# Patient Record
Sex: Male | Born: 1940 | Race: White | Hispanic: No | Marital: Married | Smoking: Former smoker
Health system: Southern US, Community
[De-identification: ages and names within clinical notes are randomized; demographics above are authoritative.]

## PROBLEM LIST (undated history)

## (undated) DIAGNOSIS — Z87828 Personal history of other (healed) physical injury and trauma: Secondary | ICD-10-CM

## (undated) DIAGNOSIS — M4800 Spinal stenosis, site unspecified: Secondary | ICD-10-CM

## (undated) DIAGNOSIS — E1140 Type 2 diabetes mellitus with diabetic neuropathy, unspecified: Secondary | ICD-10-CM

## (undated) DIAGNOSIS — E559 Vitamin D deficiency, unspecified: Secondary | ICD-10-CM

## (undated) DIAGNOSIS — I350 Nonrheumatic aortic (valve) stenosis: Secondary | ICD-10-CM

## (undated) DIAGNOSIS — I6523 Occlusion and stenosis of bilateral carotid arteries: Secondary | ICD-10-CM

## (undated) DIAGNOSIS — E1129 Type 2 diabetes mellitus with other diabetic kidney complication: Secondary | ICD-10-CM

## (undated) DIAGNOSIS — Z85828 Personal history of other malignant neoplasm of skin: Secondary | ICD-10-CM

## (undated) DIAGNOSIS — I255 Ischemic cardiomyopathy: Secondary | ICD-10-CM

## (undated) DIAGNOSIS — E669 Obesity, unspecified: Secondary | ICD-10-CM

## (undated) DIAGNOSIS — N400 Enlarged prostate without lower urinary tract symptoms: Secondary | ICD-10-CM

## (undated) DIAGNOSIS — E7800 Pure hypercholesterolemia, unspecified: Secondary | ICD-10-CM

## (undated) DIAGNOSIS — C801 Malignant (primary) neoplasm, unspecified: Secondary | ICD-10-CM

## (undated) DIAGNOSIS — Z951 Presence of aortocoronary bypass graft: Secondary | ICD-10-CM

## (undated) DIAGNOSIS — J449 Chronic obstructive pulmonary disease, unspecified: Secondary | ICD-10-CM

## (undated) DIAGNOSIS — I998 Other disorder of circulatory system: Secondary | ICD-10-CM

## (undated) DIAGNOSIS — J309 Allergic rhinitis, unspecified: Secondary | ICD-10-CM

## (undated) DIAGNOSIS — S88119A Complete traumatic amputation at level between knee and ankle, unspecified lower leg, initial encounter: Secondary | ICD-10-CM

## (undated) DIAGNOSIS — I10 Essential (primary) hypertension: Secondary | ICD-10-CM

## (undated) DIAGNOSIS — Z8673 Personal history of transient ischemic attack (TIA), and cerebral infarction without residual deficits: Secondary | ICD-10-CM

## (undated) HISTORY — DX: Obesity, unspecified: E66.9

## (undated) HISTORY — DX: Type 2 diabetes mellitus with diabetic neuropathy, unspecified: E11.40

## (undated) HISTORY — PX: TONSILLECTOMY: SUR1361

## (undated) HISTORY — DX: Benign prostatic hyperplasia without lower urinary tract symptoms: N40.0

## (undated) HISTORY — DX: Complete traumatic amputation at level between knee and ankle, unspecified lower leg, initial encounter: S88.119A

## (undated) HISTORY — PX: OTHER SURGICAL HISTORY: SHX169

## (undated) HISTORY — DX: Occlusion and stenosis of bilateral carotid arteries: I65.23

## (undated) HISTORY — DX: Vitamin D deficiency, unspecified: E55.9

## (undated) HISTORY — DX: Type 2 diabetes mellitus with other diabetic kidney complication: E11.29

## (undated) HISTORY — PX: CHOLECYSTECTOMY: SHX55

## (undated) HISTORY — DX: Spinal stenosis, site unspecified: M48.00

## (undated) HISTORY — PX: LUMBAR LAMINECTOMY/DECOMPRESSION MICRODISCECTOMY: SHX5026

## (undated) HISTORY — PX: BACK SURGERY: SHX140

## (undated) HISTORY — PX: CORONARY ARTERY BYPASS GRAFT: SHX141

## (undated) HISTORY — DX: Chronic obstructive pulmonary disease, unspecified: J44.9

---

## 2003-11-23 ENCOUNTER — Emergency Department (HOSPITAL_COMMUNITY): Admission: EM | Admit: 2003-11-23 | Discharge: 2003-11-23 | Payer: Self-pay | Admitting: Emergency Medicine

## 2004-12-25 ENCOUNTER — Ambulatory Visit (HOSPITAL_COMMUNITY): Admission: AD | Admit: 2004-12-25 | Discharge: 2004-12-28 | Payer: Self-pay | Admitting: Neurosurgery

## 2005-04-16 ENCOUNTER — Encounter: Admission: RE | Admit: 2005-04-16 | Discharge: 2005-04-16 | Payer: Self-pay | Admitting: Neurosurgery

## 2005-06-11 ENCOUNTER — Encounter: Admission: RE | Admit: 2005-06-11 | Discharge: 2005-06-11 | Payer: Self-pay | Admitting: Specialist

## 2005-07-08 ENCOUNTER — Encounter: Admission: RE | Admit: 2005-07-08 | Discharge: 2005-07-08 | Payer: Self-pay | Admitting: Specialist

## 2005-08-05 ENCOUNTER — Ambulatory Visit: Payer: Self-pay

## 2005-09-13 ENCOUNTER — Inpatient Hospital Stay (HOSPITAL_COMMUNITY): Admission: AD | Admit: 2005-09-13 | Discharge: 2005-09-16 | Payer: Self-pay | Admitting: Specialist

## 2005-09-17 ENCOUNTER — Emergency Department (HOSPITAL_COMMUNITY): Admission: EM | Admit: 2005-09-17 | Discharge: 2005-09-17 | Payer: Self-pay | Admitting: *Deleted

## 2006-03-05 ENCOUNTER — Emergency Department (HOSPITAL_COMMUNITY): Admission: EM | Admit: 2006-03-05 | Discharge: 2006-03-05 | Payer: Self-pay | Admitting: Emergency Medicine

## 2008-12-20 DIAGNOSIS — Z951 Presence of aortocoronary bypass graft: Secondary | ICD-10-CM

## 2008-12-20 HISTORY — DX: Presence of aortocoronary bypass graft: Z95.1

## 2008-12-26 ENCOUNTER — Encounter: Payer: Self-pay | Admitting: Cardiology

## 2009-01-01 ENCOUNTER — Ambulatory Visit: Payer: Self-pay | Admitting: Cardiology

## 2009-01-15 ENCOUNTER — Ambulatory Visit: Payer: Self-pay

## 2009-01-15 ENCOUNTER — Encounter: Payer: Self-pay | Admitting: Cardiology

## 2009-01-21 ENCOUNTER — Ambulatory Visit: Payer: Self-pay | Admitting: Vascular Surgery

## 2009-01-28 ENCOUNTER — Ambulatory Visit: Payer: Self-pay

## 2009-01-30 ENCOUNTER — Ambulatory Visit: Payer: Self-pay | Admitting: Cardiology

## 2009-01-30 LAB — CONVERTED CEMR LAB
BUN: 16 mg/dL (ref 6–23)
Basophils Absolute: 0 10*3/uL (ref 0.0–0.1)
Basophils Relative: 0.3 % (ref 0.0–3.0)
CO2: 31 meq/L (ref 19–32)
Calcium: 9.4 mg/dL (ref 8.4–10.5)
Chloride: 98 meq/L (ref 96–112)
Creatinine, Ser: 0.8 mg/dL (ref 0.4–1.5)
Eosinophils Absolute: 0.4 10*3/uL (ref 0.0–0.7)
Eosinophils Relative: 4.3 % (ref 0.0–5.0)
GFR calc Af Amer: 124 mL/min
GFR calc non Af Amer: 103 mL/min
Glucose, Bld: 210 mg/dL — ABNORMAL HIGH (ref 70–99)
HCT: 43.9 % (ref 39.0–52.0)
Hemoglobin: 15.7 g/dL (ref 13.0–17.0)
INR: 1.1 — ABNORMAL HIGH (ref 0.8–1.0)
Lymphocytes Relative: 29.5 % (ref 12.0–46.0)
MCHC: 35.7 g/dL (ref 30.0–36.0)
MCV: 86 fL (ref 78.0–100.0)
Monocytes Absolute: 0.8 10*3/uL (ref 0.1–1.0)
Monocytes Relative: 8.1 % (ref 3.0–12.0)
Neutro Abs: 5.4 10*3/uL (ref 1.4–7.7)
Neutrophils Relative %: 57.8 % (ref 43.0–77.0)
Platelets: 213 10*3/uL (ref 150–400)
Potassium: 4 meq/L (ref 3.5–5.1)
Prothrombin Time: 11.7 s (ref 10.9–13.3)
RBC: 5.11 M/uL (ref 4.22–5.81)
RDW: 13.3 % (ref 11.5–14.6)
Sodium: 136 meq/L (ref 135–145)
WBC: 9.4 10*3/uL (ref 4.5–10.5)
aPTT: 30.4 s — ABNORMAL HIGH (ref 21.7–29.8)

## 2009-01-31 ENCOUNTER — Inpatient Hospital Stay (HOSPITAL_BASED_OUTPATIENT_CLINIC_OR_DEPARTMENT_OTHER): Admission: RE | Admit: 2009-01-31 | Discharge: 2009-01-31 | Payer: Self-pay | Admitting: Cardiology

## 2009-01-31 ENCOUNTER — Ambulatory Visit: Payer: Self-pay | Admitting: Cardiology

## 2009-02-05 ENCOUNTER — Ambulatory Visit: Payer: Self-pay | Admitting: Cardiothoracic Surgery

## 2009-02-07 ENCOUNTER — Ambulatory Visit (HOSPITAL_COMMUNITY): Admission: RE | Admit: 2009-02-07 | Discharge: 2009-02-07 | Payer: Self-pay | Admitting: Cardiothoracic Surgery

## 2009-02-07 ENCOUNTER — Encounter: Payer: Self-pay | Admitting: Cardiothoracic Surgery

## 2009-02-11 ENCOUNTER — Inpatient Hospital Stay (HOSPITAL_COMMUNITY): Admission: RE | Admit: 2009-02-11 | Discharge: 2009-02-16 | Payer: Self-pay | Admitting: Cardiothoracic Surgery

## 2009-02-11 ENCOUNTER — Ambulatory Visit: Payer: Self-pay | Admitting: Cardiothoracic Surgery

## 2009-02-11 ENCOUNTER — Ambulatory Visit: Payer: Self-pay | Admitting: Vascular Surgery

## 2009-02-28 ENCOUNTER — Ambulatory Visit: Payer: Self-pay | Admitting: Vascular Surgery

## 2009-03-07 ENCOUNTER — Ambulatory Visit: Payer: Self-pay | Admitting: Cardiothoracic Surgery

## 2009-03-07 ENCOUNTER — Encounter: Admission: RE | Admit: 2009-03-07 | Discharge: 2009-03-07 | Payer: Self-pay | Admitting: Cardiothoracic Surgery

## 2009-04-16 DIAGNOSIS — M1990 Unspecified osteoarthritis, unspecified site: Secondary | ICD-10-CM

## 2009-04-16 DIAGNOSIS — N400 Enlarged prostate without lower urinary tract symptoms: Secondary | ICD-10-CM | POA: Insufficient documentation

## 2009-04-16 DIAGNOSIS — Z951 Presence of aortocoronary bypass graft: Secondary | ICD-10-CM | POA: Insufficient documentation

## 2009-04-16 DIAGNOSIS — M48061 Spinal stenosis, lumbar region without neurogenic claudication: Secondary | ICD-10-CM | POA: Insufficient documentation

## 2009-04-16 DIAGNOSIS — I6523 Occlusion and stenosis of bilateral carotid arteries: Secondary | ICD-10-CM | POA: Insufficient documentation

## 2009-04-16 DIAGNOSIS — E782 Mixed hyperlipidemia: Secondary | ICD-10-CM | POA: Insufficient documentation

## 2009-04-16 DIAGNOSIS — I119 Hypertensive heart disease without heart failure: Secondary | ICD-10-CM

## 2009-04-16 DIAGNOSIS — E1129 Type 2 diabetes mellitus with other diabetic kidney complication: Secondary | ICD-10-CM | POA: Insufficient documentation

## 2009-04-18 ENCOUNTER — Ambulatory Visit: Payer: Self-pay

## 2009-04-18 ENCOUNTER — Encounter: Payer: Self-pay | Admitting: Nurse Practitioner

## 2009-04-18 ENCOUNTER — Encounter: Payer: Self-pay | Admitting: Cardiology

## 2009-04-18 ENCOUNTER — Ambulatory Visit: Payer: Self-pay | Admitting: Cardiology

## 2009-04-18 ENCOUNTER — Encounter (INDEPENDENT_AMBULATORY_CARE_PROVIDER_SITE_OTHER): Payer: Self-pay | Admitting: *Deleted

## 2009-04-30 ENCOUNTER — Encounter: Payer: Self-pay | Admitting: Cardiology

## 2009-04-30 ENCOUNTER — Ambulatory Visit: Payer: Self-pay

## 2009-05-02 ENCOUNTER — Telehealth: Payer: Self-pay | Admitting: Cardiology

## 2009-05-06 ENCOUNTER — Ambulatory Visit: Payer: Self-pay | Admitting: Cardiology

## 2009-09-19 ENCOUNTER — Ambulatory Visit: Payer: Self-pay | Admitting: Vascular Surgery

## 2009-10-27 ENCOUNTER — Ambulatory Visit: Payer: Self-pay | Admitting: Gastroenterology

## 2010-05-01 ENCOUNTER — Ambulatory Visit: Payer: Self-pay | Admitting: Vascular Surgery

## 2010-10-27 ENCOUNTER — Encounter: Payer: Self-pay | Admitting: Cardiology

## 2010-10-29 ENCOUNTER — Ambulatory Visit: Payer: Self-pay | Admitting: Cardiology

## 2010-10-29 DIAGNOSIS — I509 Heart failure, unspecified: Secondary | ICD-10-CM | POA: Insufficient documentation

## 2010-11-20 ENCOUNTER — Ambulatory Visit: Payer: Self-pay

## 2010-11-20 ENCOUNTER — Ambulatory Visit: Payer: Self-pay | Admitting: Cardiology

## 2010-11-20 ENCOUNTER — Encounter: Payer: Self-pay | Admitting: Cardiology

## 2010-11-20 ENCOUNTER — Ambulatory Visit (HOSPITAL_COMMUNITY)
Admission: RE | Admit: 2010-11-20 | Discharge: 2010-11-20 | Payer: Self-pay | Source: Home / Self Care | Admitting: Cardiology

## 2011-01-10 ENCOUNTER — Encounter: Payer: Self-pay | Admitting: Neurosurgery
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
## 2011-01-21 NOTE — Miscellaneous (Signed)
  Clinical Lists Changes  Observations: Added new observation of ECHOINTERP: 1. Left ventricle: I can not fully assess all LV segments. Overall    LV function seems to be good. The cavity size was normal. The    estimated ejection fraction was 60%. 2. Aortic valve: The aortic valve is calcified with decreased    motion. I suspect that Aortic stenosis is moderate. Valve area:    1.08cm^2(VTI). Valve area: 1.11cm^2 (Vmax). 3. Right ventricle: The cavity size was mildly dilated. Systolic    function was mildly reduced. (04/30/2009 13:43)      Echocardiogram  Procedure date:  04/30/2009  Findings:      1. Left ventricle: I can not fully assess all LV segments. Overall    LV function seems to be good. The cavity size was normal. The    estimated ejection fraction was 60%. 2. Aortic valve: The aortic valve is calcified with decreased    motion. I suspect that Aortic stenosis is moderate. Valve area:    1.08cm^2(VTI). Valve area: 1.11cm^2 (Vmax). 3. Right ventricle: The cavity size was mildly dilated. Systolic    function was mildly reduced.

## 2011-01-29 ENCOUNTER — Encounter: Payer: Self-pay | Admitting: Cardiology

## 2011-04-06 LAB — TYPE AND SCREEN
ABO/RH(D): O POS
Antibody Screen: NEGATIVE

## 2011-04-06 LAB — CREATININE, SERUM
Creatinine, Ser: 0.77 mg/dL (ref 0.4–1.5)
Creatinine, Ser: 1.21 mg/dL (ref 0.4–1.5)
GFR calc Af Amer: >60 mL/min (ref >60)
GFR calc Af Amer: >60 mL/min (ref >60)
GFR calc non Af Amer: >60 mL/min (ref >60)
GFR calc non Af Amer: >60 mL/min (ref >60)

## 2011-04-06 LAB — GLUCOSE, CAPILLARY
Glucose-Capillary: 101 mg/dL — ABNORMAL HIGH (ref 70–99)
Glucose-Capillary: 102 mg/dL — ABNORMAL HIGH (ref 70–99)
Glucose-Capillary: 104 mg/dL — ABNORMAL HIGH (ref 70–99)
Glucose-Capillary: 107 mg/dL — ABNORMAL HIGH (ref 70–99)
Glucose-Capillary: 110 mg/dL — ABNORMAL HIGH (ref 70–99)
Glucose-Capillary: 111 mg/dL — ABNORMAL HIGH (ref 70–99)
Glucose-Capillary: 117 mg/dL — ABNORMAL HIGH (ref 70–99)
Glucose-Capillary: 119 mg/dL — ABNORMAL HIGH (ref 70–99)
Glucose-Capillary: 121 mg/dL — ABNORMAL HIGH (ref 70–99)
Glucose-Capillary: 134 mg/dL — ABNORMAL HIGH (ref 70–99)
Glucose-Capillary: 135 mg/dL — ABNORMAL HIGH (ref 70–99)
Glucose-Capillary: 141 mg/dL — ABNORMAL HIGH (ref 70–99)
Glucose-Capillary: 149 mg/dL — ABNORMAL HIGH (ref 70–99)
Glucose-Capillary: 160 mg/dL — ABNORMAL HIGH (ref 70–99)
Glucose-Capillary: 164 mg/dL — ABNORMAL HIGH (ref 70–99)
Glucose-Capillary: 184 mg/dL — ABNORMAL HIGH (ref 70–99)
Glucose-Capillary: 184 mg/dL — ABNORMAL HIGH (ref 70–99)
Glucose-Capillary: 188 mg/dL — ABNORMAL HIGH (ref 70–99)
Glucose-Capillary: 188 mg/dL — ABNORMAL HIGH (ref 70–99)
Glucose-Capillary: 190 mg/dL — ABNORMAL HIGH (ref 70–99)
Glucose-Capillary: 236 mg/dL — ABNORMAL HIGH (ref 70–99)
Glucose-Capillary: 242 mg/dL — ABNORMAL HIGH (ref 70–99)
Glucose-Capillary: 58 mg/dL — ABNORMAL LOW (ref 70–99)
Glucose-Capillary: 81 mg/dL (ref 70–99)
Glucose-Capillary: 83 mg/dL (ref 70–99)
Glucose-Capillary: 85 mg/dL (ref 70–99)
Glucose-Capillary: 86 mg/dL (ref 70–99)
Glucose-Capillary: 91 mg/dL (ref 70–99)
Glucose-Capillary: 93 mg/dL (ref 70–99)
Glucose-Capillary: 94 mg/dL (ref 70–99)
Glucose-Capillary: 99 mg/dL (ref 70–99)

## 2011-04-06 LAB — BLOOD GAS, ARTERIAL
Acid-Base Excess: 2.1 mmol/L — ABNORMAL HIGH (ref 0.0–2.0)
Bicarbonate: 26 mEq/L — ABNORMAL HIGH (ref 20.0–24.0)
Drawn by: 313941
FIO2: 0.21 %
O2 Saturation: 96.4 %
Patient temperature: 98.6
TCO2: 27.2 mmol/L (ref 0–100)
pCO2 arterial: 39.6 mmHg (ref 35.0–45.0)
pH, Arterial: 7.433 (ref 7.350–7.450)
pO2, Arterial: 82.6 mmHg (ref 80.0–100.0)

## 2011-04-06 LAB — CBC
HCT: 23 % — ABNORMAL LOW (ref 39.0–52.0)
HCT: 23.8 % — ABNORMAL LOW (ref 39.0–52.0)
HCT: 24.3 % — ABNORMAL LOW (ref 39.0–52.0)
HCT: 26 % — ABNORMAL LOW (ref 39.0–52.0)
HCT: 28 % — ABNORMAL LOW (ref 39.0–52.0)
HCT: 28.4 % — ABNORMAL LOW (ref 39.0–52.0)
HCT: 28.9 % — ABNORMAL LOW (ref 39.0–52.0)
HCT: 45.7 % (ref 39.0–52.0)
Hemoglobin: 10 g/dL — ABNORMAL LOW (ref 13.0–17.0)
Hemoglobin: 10.1 g/dL — ABNORMAL LOW (ref 13.0–17.0)
Hemoglobin: 16 g/dL (ref 13.0–17.0)
Hemoglobin: 7.9 g/dL — CL (ref 13.0–17.0)
Hemoglobin: 8.4 g/dL — ABNORMAL LOW (ref 13.0–17.0)
Hemoglobin: 8.5 g/dL — ABNORMAL LOW (ref 13.0–17.0)
Hemoglobin: 9 g/dL — ABNORMAL LOW (ref 13.0–17.0)
Hemoglobin: 9.8 g/dL — ABNORMAL LOW (ref 13.0–17.0)
MCHC: 34.6 g/dL (ref 30.0–36.0)
MCHC: 34.8 g/dL (ref 30.0–36.0)
MCHC: 34.8 g/dL (ref 30.0–36.0)
MCHC: 35 g/dL (ref 30.0–36.0)
MCHC: 35 g/dL (ref 30.0–36.0)
MCHC: 35 g/dL (ref 30.0–36.0)
MCHC: 35.3 g/dL (ref 30.0–36.0)
MCHC: 35.4 g/dL (ref 30.0–36.0)
MCV: 85.2 fL (ref 78.0–100.0)
MCV: 85.4 fL (ref 78.0–100.0)
MCV: 86.1 fL (ref 78.0–100.0)
MCV: 86.8 fL (ref 78.0–100.0)
MCV: 86.9 fL (ref 78.0–100.0)
MCV: 87.2 fL (ref 78.0–100.0)
MCV: 87.3 fL (ref 78.0–100.0)
MCV: 88 fL (ref 78.0–100.0)
Platelets: 104 10*3/uL — ABNORMAL LOW (ref 150–400)
Platelets: 108 10*3/uL — ABNORMAL LOW (ref 150–400)
Platelets: 112 10*3/uL — ABNORMAL LOW (ref 150–400)
Platelets: 117 10*3/uL — ABNORMAL LOW (ref 150–400)
Platelets: 168 10*3/uL (ref 150–400)
Platelets: 225 10*3/uL (ref 150–400)
Platelets: 73 10*3/uL — ABNORMAL LOW (ref 150–400)
Platelets: 86 10*3/uL — ABNORMAL LOW (ref 150–400)
RBC: 2.63 MIL/uL — ABNORMAL LOW (ref 4.22–5.81)
RBC: 2.78 MIL/uL — ABNORMAL LOW (ref 4.22–5.81)
RBC: 2.79 MIL/uL — ABNORMAL LOW (ref 4.22–5.81)
RBC: 2.99 MIL/uL — ABNORMAL LOW (ref 4.22–5.81)
RBC: 3.18 MIL/uL — ABNORMAL LOW (ref 4.22–5.81)
RBC: 3.3 MIL/uL — ABNORMAL LOW (ref 4.22–5.81)
RBC: 3.39 MIL/uL — ABNORMAL LOW (ref 4.22–5.81)
RBC: 5.26 MIL/uL (ref 4.22–5.81)
RDW: 13.6 % (ref 11.5–15.5)
RDW: 14.3 % (ref 11.5–15.5)
RDW: 14.4 % (ref 11.5–15.5)
RDW: 14.9 % (ref 11.5–15.5)
RDW: 15 % (ref 11.5–15.5)
RDW: 15.1 % (ref 11.5–15.5)
RDW: 15.2 % (ref 11.5–15.5)
RDW: 15.8 % — ABNORMAL HIGH (ref 11.5–15.5)
WBC: 10 10*3/uL (ref 4.0–10.5)
WBC: 10.2 10*3/uL (ref 4.0–10.5)
WBC: 10.4 10*3/uL (ref 4.0–10.5)
WBC: 10.9 10*3/uL — ABNORMAL HIGH (ref 4.0–10.5)
WBC: 11.2 10*3/uL — ABNORMAL HIGH (ref 4.0–10.5)
WBC: 13.6 10*3/uL — ABNORMAL HIGH (ref 4.0–10.5)
WBC: 8.4 10*3/uL (ref 4.0–10.5)
WBC: 9.6 10*3/uL (ref 4.0–10.5)

## 2011-04-06 LAB — POCT I-STAT 3, ART BLOOD GAS (G3+)
Acid-Base Excess: 1 mmol/L (ref 0.0–2.0)
Acid-Base Excess: 1 mmol/L (ref 0.0–2.0)
Acid-Base Excess: 1 mmol/L (ref 0.0–2.0)
Acid-Base Excess: 2 mmol/L (ref 0.0–2.0)
Acid-Base Excess: 2 mmol/L (ref 0.0–2.0)
Acid-base deficit: 2 mmol/L (ref 0.0–2.0)
Acid-base deficit: 3 mmol/L — ABNORMAL HIGH (ref 0.0–2.0)
Bicarbonate: 21.9 mEq/L (ref 20.0–24.0)
Bicarbonate: 23 mEq/L (ref 20.0–24.0)
Bicarbonate: 24.3 mEq/L — ABNORMAL HIGH (ref 20.0–24.0)
Bicarbonate: 24.5 mEq/L — ABNORMAL HIGH (ref 20.0–24.0)
Bicarbonate: 25.4 mEq/L — ABNORMAL HIGH (ref 20.0–24.0)
Bicarbonate: 26.7 mEq/L — ABNORMAL HIGH (ref 20.0–24.0)
Bicarbonate: 27.2 mEq/L — ABNORMAL HIGH (ref 20.0–24.0)
Bicarbonate: 28.4 mEq/L — ABNORMAL HIGH (ref 20.0–24.0)
O2 Saturation: 100 %
O2 Saturation: 100 %
O2 Saturation: 100 %
O2 Saturation: 100 %
O2 Saturation: 70 %
O2 Saturation: 72 %
O2 Saturation: 98 %
O2 Saturation: 98 %
Patient temperature: 35.1
Patient temperature: 37.2
TCO2: 23 mmol/L (ref 0–100)
TCO2: 24 mmol/L (ref 0–100)
TCO2: 25 mmol/L (ref 0–100)
TCO2: 26 mmol/L (ref 0–100)
TCO2: 27 mmol/L (ref 0–100)
TCO2: 28 mmol/L (ref 0–100)
TCO2: 29 mmol/L (ref 0–100)
TCO2: 30 mmol/L (ref 0–100)
pCO2 arterial: 33.6 mmHg — ABNORMAL LOW (ref 35.0–45.0)
pCO2 arterial: 34.2 mmHg — ABNORMAL LOW (ref 35.0–45.0)
pCO2 arterial: 34.4 mmHg — ABNORMAL LOW (ref 35.0–45.0)
pCO2 arterial: 40.5 mmHg (ref 35.0–45.0)
pCO2 arterial: 40.5 mmHg (ref 35.0–45.0)
pCO2 arterial: 46.5 mmHg — ABNORMAL HIGH (ref 35.0–45.0)
pCO2 arterial: 47.1 mmHg — ABNORMAL HIGH (ref 35.0–45.0)
pCO2 arterial: 50.7 mmHg — ABNORMAL HIGH (ref 35.0–45.0)
pH, Arterial: 7.356 (ref 7.350–7.450)
pH, Arterial: 7.363 (ref 7.350–7.450)
pH, Arterial: 7.366 (ref 7.350–7.450)
pH, Arterial: 7.37 (ref 7.350–7.450)
pH, Arterial: 7.406 (ref 7.350–7.450)
pH, Arterial: 7.413 (ref 7.350–7.450)
pH, Arterial: 7.456 — ABNORMAL HIGH (ref 7.350–7.450)
pH, Arterial: 7.463 — ABNORMAL HIGH (ref 7.350–7.450)
pO2, Arterial: 101 mmHg — ABNORMAL HIGH (ref 80.0–100.0)
pO2, Arterial: 252 mmHg — ABNORMAL HIGH (ref 80.0–100.0)
pO2, Arterial: 297 mmHg — ABNORMAL HIGH (ref 80.0–100.0)
pO2, Arterial: 330 mmHg — ABNORMAL HIGH (ref 80.0–100.0)
pO2, Arterial: 352 mmHg — ABNORMAL HIGH (ref 80.0–100.0)
pO2, Arterial: 39 mmHg — CL (ref 80.0–100.0)
pO2, Arterial: 39 mmHg — CL (ref 80.0–100.0)
pO2, Arterial: 99 mmHg (ref 80.0–100.0)

## 2011-04-06 LAB — POCT I-STAT 4, (NA,K, GLUC, HGB,HCT)
Glucose, Bld: 100 mg/dL — ABNORMAL HIGH (ref 70–99)
Glucose, Bld: 113 mg/dL — ABNORMAL HIGH (ref 70–99)
Glucose, Bld: 121 mg/dL — ABNORMAL HIGH (ref 70–99)
Glucose, Bld: 137 mg/dL — ABNORMAL HIGH (ref 70–99)
Glucose, Bld: 142 mg/dL — ABNORMAL HIGH (ref 70–99)
Glucose, Bld: 213 mg/dL — ABNORMAL HIGH (ref 70–99)
Glucose, Bld: 73 mg/dL (ref 70–99)
Glucose, Bld: 78 mg/dL (ref 70–99)
HCT: 20 % — ABNORMAL LOW (ref 39.0–52.0)
HCT: 20 % — ABNORMAL LOW (ref 39.0–52.0)
HCT: 20 % — ABNORMAL LOW (ref 39.0–52.0)
HCT: 21 % — ABNORMAL LOW (ref 39.0–52.0)
HCT: 23 % — ABNORMAL LOW (ref 39.0–52.0)
HCT: 27 % — ABNORMAL LOW (ref 39.0–52.0)
HCT: 35 % — ABNORMAL LOW (ref 39.0–52.0)
HCT: 40 % (ref 39.0–52.0)
Hemoglobin: 11.9 g/dL — ABNORMAL LOW (ref 13.0–17.0)
Hemoglobin: 13.6 g/dL (ref 13.0–17.0)
Hemoglobin: 6.8 g/dL — CL (ref 13.0–17.0)
Hemoglobin: 6.8 g/dL — CL (ref 13.0–17.0)
Hemoglobin: 6.8 g/dL — CL (ref 13.0–17.0)
Hemoglobin: 7.1 g/dL — CL (ref 13.0–17.0)
Hemoglobin: 7.8 g/dL — CL (ref 13.0–17.0)
Hemoglobin: 9.2 g/dL — ABNORMAL LOW (ref 13.0–17.0)
Potassium: 2.8 mEq/L — ABNORMAL LOW (ref 3.5–5.1)
Potassium: 3.1 mEq/L — ABNORMAL LOW (ref 3.5–5.1)
Potassium: 3.3 mEq/L — ABNORMAL LOW (ref 3.5–5.1)
Potassium: 3.6 mEq/L (ref 3.5–5.1)
Potassium: 3.7 mEq/L (ref 3.5–5.1)
Potassium: 3.7 mEq/L (ref 3.5–5.1)
Potassium: 3.8 mEq/L (ref 3.5–5.1)
Potassium: 6.1 mEq/L — ABNORMAL HIGH (ref 3.5–5.1)
Sodium: 135 mEq/L (ref 135–145)
Sodium: 136 mEq/L (ref 135–145)
Sodium: 139 mEq/L (ref 135–145)
Sodium: 139 mEq/L (ref 135–145)
Sodium: 140 mEq/L (ref 135–145)
Sodium: 141 mEq/L (ref 135–145)
Sodium: 141 mEq/L (ref 135–145)
Sodium: 142 mEq/L (ref 135–145)

## 2011-04-06 LAB — BASIC METABOLIC PANEL
BUN: 13 mg/dL (ref 6–23)
BUN: 15 mg/dL (ref 6–23)
BUN: 18 mg/dL (ref 6–23)
BUN: 19 mg/dL (ref 6–23)
BUN: 7 mg/dL (ref 6–23)
CO2: 23 mEq/L (ref 19–32)
CO2: 26 mEq/L (ref 19–32)
CO2: 28 mEq/L (ref 19–32)
CO2: 29 mEq/L (ref 19–32)
CO2: 29 mEq/L (ref 19–32)
Calcium: 7.5 mg/dL — ABNORMAL LOW (ref 8.4–10.5)
Calcium: 7.7 mg/dL — ABNORMAL LOW (ref 8.4–10.5)
Calcium: 7.9 mg/dL — ABNORMAL LOW (ref 8.4–10.5)
Calcium: 8.1 mg/dL — ABNORMAL LOW (ref 8.4–10.5)
Calcium: 8.5 mg/dL (ref 8.4–10.5)
Chloride: 100 mEq/L (ref 96–112)
Chloride: 101 mEq/L (ref 96–112)
Chloride: 103 mEq/L (ref 96–112)
Chloride: 105 mEq/L (ref 96–112)
Chloride: 111 mEq/L (ref 96–112)
Creatinine, Ser: 0.85 mg/dL (ref 0.4–1.5)
Creatinine, Ser: 0.97 mg/dL (ref 0.4–1.5)
Creatinine, Ser: 0.98 mg/dL (ref 0.4–1.5)
Creatinine, Ser: 1.12 mg/dL (ref 0.4–1.5)
Creatinine, Ser: 1.13 mg/dL (ref 0.4–1.5)
GFR calc Af Amer: >60 mL/min (ref >60)
GFR calc Af Amer: >60 mL/min (ref >60)
GFR calc Af Amer: >60 mL/min (ref >60)
GFR calc Af Amer: >60 mL/min (ref >60)
GFR calc Af Amer: >60 mL/min (ref >60)
GFR calc non Af Amer: >60 mL/min (ref >60)
GFR calc non Af Amer: >60 mL/min (ref >60)
GFR calc non Af Amer: >60 mL/min (ref >60)
GFR calc non Af Amer: >60 mL/min (ref >60)
GFR calc non Af Amer: >60 mL/min (ref >60)
Glucose, Bld: 122 mg/dL — ABNORMAL HIGH (ref 70–99)
Glucose, Bld: 131 mg/dL — ABNORMAL HIGH (ref 70–99)
Glucose, Bld: 213 mg/dL — ABNORMAL HIGH (ref 70–99)
Glucose, Bld: 50 mg/dL — ABNORMAL LOW (ref 70–99)
Glucose, Bld: 98 mg/dL (ref 70–99)
Potassium: 3.3 mEq/L — ABNORMAL LOW (ref 3.5–5.1)
Potassium: 3.7 mEq/L (ref 3.5–5.1)
Potassium: 3.7 mEq/L (ref 3.5–5.1)
Potassium: 3.9 mEq/L (ref 3.5–5.1)
Potassium: 4 mEq/L (ref 3.5–5.1)
Sodium: 135 mEq/L (ref 135–145)
Sodium: 136 mEq/L (ref 135–145)
Sodium: 137 mEq/L (ref 135–145)
Sodium: 138 mEq/L (ref 135–145)
Sodium: 142 mEq/L (ref 135–145)

## 2011-04-06 LAB — PROTIME-INR
INR: 1.1 (ref 0.00–1.49)
INR: 1.7 — ABNORMAL HIGH (ref 0.00–1.49)
Prothrombin Time: 14.1 seconds (ref 11.6–15.2)
Prothrombin Time: 20.7 seconds — ABNORMAL HIGH (ref 11.6–15.2)

## 2011-04-06 LAB — HEMOGLOBIN A1C
Hgb A1c MFr Bld: 10.3 % — ABNORMAL HIGH (ref 4.6–6.1)
Mean Plasma Glucose: 249 mg/dL

## 2011-04-06 LAB — DIFFERENTIAL
Basophils Absolute: 0 10*3/uL (ref 0.0–0.1)
Basophils Relative: 0 % (ref 0–1)
Eosinophils Absolute: 0.1 10*3/uL (ref 0.0–0.7)
Eosinophils Relative: 1 % (ref 0–5)
Lymphocytes Relative: 19 % (ref 12–46)
Lymphs Abs: 1.6 10*3/uL (ref 0.7–4.0)
Monocytes Absolute: 1.1 10*3/uL — ABNORMAL HIGH (ref 0.1–1.0)
Monocytes Relative: 13 % — ABNORMAL HIGH (ref 3–12)
Neutro Abs: 5.6 10*3/uL (ref 1.7–7.7)
Neutrophils Relative %: 67 % (ref 43–77)

## 2011-04-06 LAB — MAGNESIUM
Magnesium: 2.3 mg/dL (ref 1.5–2.5)
Magnesium: 2.3 mg/dL (ref 1.5–2.5)
Magnesium: 2.5 mg/dL (ref 1.5–2.5)

## 2011-04-06 LAB — ABO/RH: ABO/RH(D): O POS

## 2011-04-06 LAB — URINE MICROSCOPIC-ADD ON

## 2011-04-06 LAB — URINALYSIS, ROUTINE W REFLEX MICROSCOPIC
Bilirubin Urine: NEGATIVE
Glucose, UA: 250 mg/dL — AB
Hgb urine dipstick: NEGATIVE
Ketones, ur: NEGATIVE mg/dL
Leukocytes, UA: NEGATIVE
Nitrite: NEGATIVE
Protein, ur: 30 mg/dL — AB
Specific Gravity, Urine: 1.026 (ref 1.005–1.030)
Urobilinogen, UA: 0.2 mg/dL (ref 0.0–1.0)
pH: 6 (ref 5.0–8.0)

## 2011-04-06 LAB — COMPREHENSIVE METABOLIC PANEL
ALT: 21 U/L (ref 0–53)
AST: 18 U/L (ref 0–37)
Albumin: 3.8 g/dL (ref 3.5–5.2)
Alkaline Phosphatase: 71 U/L (ref 39–117)
BUN: 16 mg/dL (ref 6–23)
CO2: 21 mEq/L (ref 19–32)
Calcium: 9.3 mg/dL (ref 8.4–10.5)
Chloride: 104 mEq/L (ref 96–112)
Creatinine, Ser: 0.86 mg/dL (ref 0.4–1.5)
GFR calc Af Amer: >60 mL/min (ref >60)
GFR calc non Af Amer: >60 mL/min (ref >60)
Glucose, Bld: 206 mg/dL — ABNORMAL HIGH (ref 70–99)
Potassium: 3.7 mEq/L (ref 3.5–5.1)
Sodium: 138 mEq/L (ref 135–145)
Total Bilirubin: 0.9 mg/dL (ref 0.3–1.2)
Total Protein: 6.7 g/dL (ref 6.0–8.3)

## 2011-04-06 LAB — POCT I-STAT, CHEM 8
BUN: 7 mg/dL (ref 6–23)
Calcium, Ion: 1.03 mmol/L — ABNORMAL LOW (ref 1.12–1.32)
Chloride: 107 mEq/L (ref 96–112)
Creatinine, Ser: 0.9 mg/dL (ref 0.4–1.5)
Glucose, Bld: 139 mg/dL — ABNORMAL HIGH (ref 70–99)
HCT: 28 % — ABNORMAL LOW (ref 39.0–52.0)
Hemoglobin: 9.5 g/dL — ABNORMAL LOW (ref 13.0–17.0)
Potassium: 3.7 mEq/L (ref 3.5–5.1)
Sodium: 142 mEq/L (ref 135–145)
TCO2: 23 mmol/L (ref 0–100)

## 2011-04-06 LAB — HEPARIN ANTIBODY SCREEN: Heparin Antibody Screen: NEGATIVE

## 2011-04-06 LAB — HEMOGLOBIN AND HEMATOCRIT, BLOOD
HCT: 23.2 % — ABNORMAL LOW (ref 39.0–52.0)
Hemoglobin: 8.3 g/dL — ABNORMAL LOW (ref 13.0–17.0)

## 2011-04-06 LAB — POCT I-STAT GLUCOSE
Glucose, Bld: 254 mg/dL — ABNORMAL HIGH (ref 70–99)
Glucose, Bld: 103 mg/dL — ABNORMAL HIGH (ref 70–99)
Glucose, Bld: 114 mg/dL — ABNORMAL HIGH (ref 70–99)
Glucose, Bld: 156 mg/dL — ABNORMAL HIGH (ref 70–99)
Operator id: 133881
Operator id: 125961
Operator id: 190281
Operator id: 3342

## 2011-04-06 LAB — PREPARE FRESH FROZEN PLASMA

## 2011-04-06 LAB — APTT
aPTT: 37 seconds (ref 24–37)
aPTT: 38 seconds — ABNORMAL HIGH (ref 24–37)

## 2011-04-06 LAB — PLATELET COUNT: Platelets: 95 10*3/uL — ABNORMAL LOW (ref 150–400)

## 2011-04-21 ENCOUNTER — Other Ambulatory Visit: Payer: Self-pay

## 2011-05-04 NOTE — Op Note (Signed)
NAMEFOREST, REDWINE NO.:  [id number]   MEDICAL RECORD NO.:  [id number]          PATIENT TYPE:  INP   LOCATION:  2302                         FACILITY:  [facility]   PHYSICIAN:  Larina Earthly, M.D.    DATE OF BIRTH:  1941/07/28   DATE OF PROCEDURE:  DATE OF DISCHARGE:                               OPERATIVE REPORT   PREOPERATIVE DIAGNOSES:  Severe asymptomatic right internal carotid  artery stenosis with coronary artery disease.   POSTOPERATIVE DIAGNOSES:  Severe asymptomatic right internal carotid  artery stenosis with coronary artery disease.   PROCEDURE:  Right carotid endarterectomy and Dacron patch angioplasty.   SURGEON:  Larina Earthly, MD   ASSISTANT:  Jerold Coombe, PA-C.   ANESTHESIA:  General endotracheal.   COMPLICATIONS:  None.   DISPOSITION:  The patient did undergo coronary artery bypass grafting by  Dr. Kathlee Nations Trigt which will be dictated in a separate note.   PROCEDURE IN DETAIL:  The patient was taken to the operating room,  placed in the supine position.  The area of the right neck, chest,  abdomen, and legs were prepped for preparation of combined  endarterectomy and coronary artery bypass grafting.  Incision was made  in the anterior sternocleidomastoid in the right carried down through  the platysma with electrocautery.  The sternocleidomastoid was reflected  posteriorly and the carotid sheath was opened.  Facial vein was ligated  with 2-0 silk ties and divided.  The common carotid artery was encircled  with an umbilical tape and Rumel tourniquet then vagus and hypoglossal  nerves were identified and preserved.  The patient had a relatively high  bifurcation and there was extension of plaque into the internal carotid  artery.  The internal carotid artery was exposed distally above the  level of the plaque.  The superior thyroid artery was encircled with 2-0  silkPottstie.  The external carotid artery was encircled with a blue  vessel loop and the internal carotid was encircled with umbilical tape  and Rumel tourniquet.  The patient was given 8000 units of intravenous  heparin and after adequate circulation time, the internal, external, and  common carotid arteries were occluded.  The common carotid arteries were  opened with an 11 blade and extended longitudinally with Potts scissors  to the plaque onto the internal carotid artery above the plaque.  A 10  shunt was passed up internal carotid to allow the backbleed and down the  common carotid were it was secured with Rumel tourniquets.  The  endarterectomy was begun on the common carotid artery and plaque was  divided proximally with Potts scissors.  The endarterectomy was carried  down the bifurcation.  The external carotid was endarterectomized with  an eversion technique and the internal carotid was endarterectomized in  an open fashion.  Remaining atheromatous debris was removed from  endarterectomy plane.  A Finesse Hemashield Dacron patch was brought  onto to the field and was sewn as a patch angioplasty with a running 6-0  Prolene suture.  Prior to completion of the anastomosis, shunt was  removed and the usual  flushing maneuvers were undertaken.  The  anastomosis was complete and the flow was restored first to the external  and internal carotid artery.  Excellent flow characteristics were noted  with hand-held Doppler in the  internal and external carotid arteries.  The wounds were irrigated with saline.  There was no evidence of  bleeding from the suture line and no significant bleeding from the  incisions.  A Surgicel and Ray-Tec were placed in the neck wound and  this was closed with 2-0 nylon mattress sutures.  Dr. Donata Clay then  proceeded with coronary artery bypass grafting which will be dictated in  separate note.  At the completion of the coronary artery bypass  grafting, the neck was reopened and closed in the usual fashion.      Larina Earthly, M.D.  Electronically Signed     TFE/MEDQ  D:  02/11/2009  T:  02/12/2009  Job:  [id number]
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
## 2011-05-04 NOTE — Cardiovascular Report (Signed)
[name], [name] NO.:  [id number]   MEDICAL RECORD NO.:  [id number]          PATIENT TYPE:  OIB   LOCATION:  1966                         FACILITY:  [facility]   PHYSICIAN:  Rollene Rotunda, MD, FACCDATE OF BIRTH:  Jul 11, 1941   DATE OF PROCEDURE:  01/31/2009  DATE OF DISCHARGE:  01/31/2009                            CARDIAC CATHETERIZATION   PRIMARY CARE PHYSICIAN:  Lucky Cowboy, MD   PROCEDURE:  Left heart catheterization/coronary arteriography.   INDICATIONS:  Evaluate the patient with chest discomfort.   PROCEDURE NOTE:  Left heart catheterization was performed via the right  femoral artery.  The artery was cannulated using an anterior wall  puncture.  A #4-French arterial sheath was inserted via the modified  Seldinger technique.  Preformed Judkins and a pigtail catheter were  utilized.  The patient tolerated the procedure well and left the lab in  stable condition.   RESULTS:  1. Hemodynamics:  LV 153/27, AO 145/71.  2. Coronaries:  The left main had heavy calcification with long 30%      stenosis and distal 50% stenosis.  The LAD had a long proximal 90%      followed by mid long 25% stenosis.  There was 50% stenosis      following this.  The first diagonal was small with ostial 90%      stenosis.  Second diagonal was moderate sized and normal.  The      circumflex was a large vessel in the AV groove.  There were diffuse      luminal irregularities.  The mid-obtuse marginal was moderate sized      and branching with long proximal 80% stenosis.  The posterolateral      was large with proximal long 50% stenosis.  There was a ramus      intermediate which was large and it had 80% ostial and proximal 80%      stenosis.  The right coronary artery was a dominant vessel.  There      was mid 70% stenosis.  There was distal 40% stenosis before the      PDA.  PDA was moderate sized and normal.  3. Left ventriculogram:  The left ventriculogram was obtained in  the      RAO projection.  The EF was 65% and normal.   CONCLUSION:  Severe 3-vessel coronary artery disease.  Preserved  ejection fraction.   PLAN:  The patient will have to have CABG.  We will discuss the timing  of this in the phase of his severe carotid stenosis.      Rollene Rotunda, MD, [facility]  Electronically Signed     JH/MEDQ  D:  04/17/2009  T:  04/17/2009  Job:  [id number]   cc:   Lucky Cowboy, M.D.

## 2011-05-04 NOTE — Assessment & Plan Note (Signed)
OFFICE VISIT   Anthony Holder, Anthony Holder  DOB:  22-Sep-1941                                       05/01/2010  ZOXWR#:[id number]   Anthony Holder presents today for followup of his prior right carotid  endarterectomy and also for evaluation discoloration of his left foot.  He is a 70 year old gentleman who underwent combined right carotid  endarterectomy for severe asymptomatic stenosis and coronary bypass  grafting with Dr. Kathlee Nations Trigt in February  2010.  He has had no  neurologic deficits..  He does have a no of  amaurosis fugax transient  ischemic type or stroke.  He does have some lower extremity discomfort.  This is mainly related to night cramping and cramping after activity.  He does not have any claudication-type symptoms.  He does have some  swelling more so on the left than on the right and reports this pain has  more prominent following vein harvest on the left for coronary bypass  grafting.Anthony Holder  He does not have any history of tissue loss.  He is  concerned regarding the darkening of the skin over his left foot.  His  has had no cardiac difficulties since his surgery in February 2010.  He  is an insulin dependent diabetic and does have hypertension which is  under control with medications.   FAMILY HISTORY:  Negative for premature atherosclerotic disease.   SOCIAL HISTORY:  He is married with 5 children.  He quit smoking in  1960.  Does not drink alcohol.   REVIEW OF SYSTEMS:  Otherwise negative other than the HPI.   PHYSICAL EXAMINATION:  Well developed, well nourished white male  appearing stated age of 25.  Blood pressure 147/85, pulse 84, respirations 18.  His right carotid incision is well-healed.  He has no bruits  bilaterally.  He has palpable radial, femoral, and popliteal pulses  bilaterally.  He does have 1+ left dorsalis pedis and 2+ right dorsalis  pedis pulse.  CHEST:  Clear bilaterally.  NEUROLOGIC:  No focal weakness or  paresthesias.  MUSCULOSKELETAL:  No major deformities or cyanosis.  SKIN:  Without rashes or ulcers.  He does have some hemosiderin deposits  on his left medial ankle and onto the dorsum of his distal left foot.   He underwent noninvasive vascular laboratory studies in our office.  This reveals mild to moderate narrowing in his internal carotid arteries  bilaterally with no severe stenosis.  His lower extremity Doppler  studies revealed normal __________ in the right and is slightly  diminished on the left with an ankle index of 0.92.  He does have  biphasic signals.  I discussed this at length with Mr. Magallon.  I  explained that he does not have any evidence of severe lower extremity  arterial insufficiency and that his skin changes over his foot are  related to chronic venous hypertension and hemosiderin pigment deposit  in his skin.  He understands there is no specific treatment of this.  I  did explain that could have a compression garments but with his of mild  to moderate swelling, I do not feel that he would be willing to tolerate  this.  He will continue his normal activities and we will continue to  see him with noninvasive vascular laboratory studies on a yearly basis  to rule out any change in his  extracranial cerebrovascular occlusive  disease.     Larina Earthly, M.D.  Electronically Signed   TFE/MEDQ  D:  05/01/2010  T:  05/04/2010  Job:  [id number]   cc:   Dr. Cheri Rous

## 2011-05-04 NOTE — Discharge Summary (Signed)
Anthony Holder, Anthony Holder NO.:  [id number]   MEDICAL RECORD NO.:  [id number]          PATIENT TYPE:  INP   LOCATION:  2018                         FACILITY:  [facility]   PHYSICIAN:  Kerin Perna, M.D.  DATE OF BIRTH:  1941/02/28   DATE OF ADMISSION:  02/11/2009  DATE OF DISCHARGE:                               DISCHARGE SUMMARY   PRIMARY ADMITTING DIAGNOSIS:  Severe multivessel coronary artery  disease.   ADDITIONAL/DISCHARGE DIAGNOSES:  1. Severe multivessel coronary artery disease with class III angina.  2. Severe right internal carotid artery stenosis, asymptomatic.  3. Type 2 diabetes mellitus, poorly controlled.  4. Hypertension.  5. Dyslipidemia.  6. Benign prostatic hypertrophy.  7. Remote history of tobacco abuse.   PROCEDURES PERFORMED:  1. Coronary artery bypass grafting x5 (left internal mammary artery to      the left anterior descending, saphenous vein graft to the posterior      descending artery, saphenous vein graft to the ramus intermedius,      saphenous vein graft sequentially to the first and second obtuse      marginals).  2. Endoscopic vein harvest, bilateral lower extremities.  3. Right carotid endarterectomy with Dacron patch angioplasty.   HISTORY:  The patient is a 70 year old male who was recently noted by  Dr. Oneta Rack to have a cardiac murmur.  He reported some exertional chest  discomfort as well.  A 2-D echocardiogram was performed, which showed  mild aortic stenosis with an aortic valve area of 1.2-1.3 and a  transvalvular gradient of 18-20 mmHg.  He also underwent a carotid  duplex scan, which showed a tight 90% right ICA stenosis.  A stress test  was performed, which showed reversible anterior ischemia.  He underwent  cardiac catheterization by Dr. Antoine Poche, which showed severe multivessel  coronary artery disease with a 90% proximal LAD stenosis, 80% stenosis  of the diagonal, 80% ramus, 80% circumflex marginal, and 75% right  coronary artery.  There was a 50% left main stenosis.  Based on his  symptoms and his multivessel disease, he was felt to be a candidate for  surgical intervention.  He was seen as an outpatient in consultation by  Dr. Kathlee Nations Trigt and his films were reviewed.  Dr. Donata Clay felt  that he would benefit from CABG at this time, as well as a right carotid  endarterectomy.  It was also felt that his aortic valve should be  evaluated at the time of surgery, but it was not felt that he would need  an AVR at this time based on his preoperative echocardiogram.  He had  also been seen as an outpatient in consultation by vascular and vein  specialist for consideration of carotid endarterectomy.  It was agreed  that this should be performed concomitantly with his CABG procedure.  All risks, benefits, and alternatives of surgery were explained to the  patient, and he agreed to proceed with surgery.   HOSPITAL COURSE:  Anthony Holder was admitted to [facility] on  February 11, 2009.  He was taken to the operating room where he  underwent a right carotid endarterectomy, performed by Dr. Tawanna Cooler Early  and coronary artery bypass grafting, performed by Dr. Kathlee Nations Trigt.  Please see operative reports for complete details.  He tolerated the  procedures well and was transferred to the SICU in stable condition.  He  was extubated shortly after surgery.  He was hemodynamically stable and  doing well on postop day 1.  He was neurologically intact and doing  well.  His lines and chest tubes were removed.  He remained in the unit  for further observation, and by postop day 2, he was ready for transfer  to the floor.  Overall, his postoperative course has progressed well.  His blood pressures have been trending upward, and he has been started  on his home doses of hydrochlorothiazide and atenolol.  Also, he has  been restarted on his home diabetes medications as his p.o. intake has  improved.  His  hemoglobin A1c was 10.3 indicating poor outpatient  control.  A diabetes care consult has been ordered, and hopefully, the  diabetes educators will see the patient prior to discharge and can  possibly arrange some further outpatient followup.  He did require  transfusion for a postoperative blood loss anemia.  He has also been  started on iron replacement.  He has been volume overloaded and was  started on Lasix, to which he is responding well.  His platelets were  initially low, and an HIT panel was obtained, which was negative for  heparin-induced thrombocytopenia.  His platelets have started to trend  back upward and stabilized at this point.  His labs on postop day 3,  show sodium 135, potassium 3.7, BUN 18, creatinine 1.12.  Hemoglobin 7.9  prior to transfusion, hematocrit 23, white count 10.4, platelets 112.  He will have a repeat CBC and BMET on February 15, 2009.  He is  presently stable.  He is ambulating in the halls with cardiac rehab  phase I and is progressing well.  He is tolerating a regular diet and is  having normal bowel and bladder function.  His incisions are all healing  well.  He does remain volume overloaded at about 6 kg above his  preoperative weight with edema on physical exam.  He has been afebrile,  and his vital signs have been stable.  Pending no acute changes, we  anticipate discharge home in the next 48 hours.   DISCHARGE MEDICATIONS:  1. Atenolol 50 mg daily.  2. Glyburide 5 mg t.i.d.  3. Metformin ER 500 mg b.i.d.  4. Hydrochlorothiazide 25 mg daily.  5. Aspirin 81 mg daily.  6. Vitamin E 400 units b.i.d.  7. Vitamin D 1000 units b.i.d.  8. Magnesium 250 mg b.i.d.  9. Vitamin B12 500 mg daily.  10.Lantus 44 units subcu nightly.  11.Crestor 10 mg nightly.  12.Nu-Iron 150 mg daily.  13.Lasix 40 mg daily x1 week.  14.Potassium 20 mEq daily x1 week.  15.Oxycodone 5-10 mg q.4 h. p.r.n. for pain.   DISCHARGE INSTRUCTIONS:  He is asked to refrain from  driving, heavy  lifting, or strenuous activity.  He may continue ambulating daily and  using his incentive spirometer.  He may shower daily and clean his  incisions with soap and water.  He will continue a low-fat, low-sodium,  carb-modified diet.   DISCHARGE FOLLOWUP:  He will see Dr. Antoine Poche back in the office in 2  weeks.  He will need to follow up with his primary care physician to  recheck his blood sugars in the next 1-2 weeks.  An appointment will be  arranged  for follow up at [facility] with Dr. Arbie Cookey to reevaluate his carotids.  He will  see Dr. Donata Clay on March 07, 2009, with a chest x-ray from [facility].  If he experiences any further problems or has questions, he is  asked to contact our office.      Coral Ceo, P.A.      Kerin Perna, M.D.  Electronically Signed    GC/MEDQ  D:  02/14/2009  T:  02/14/2009  Job:  [id number]   cc:   Rollene Rotunda, MD, [facility]  Lucky Cowboy, M.D.  Larina Earthly, M.D.  [facility] Office

## 2011-05-04 NOTE — Assessment & Plan Note (Signed)
OFFICE VISIT   SKYELER, SMOLA  DOB:  10/27/1941                                        March 07, 2009  CHART #:  [id number]   CURRENT PROBLEMS:  1. Status post coronary artery bypass graft x5 on February 11, 2009,      for severe three-vessel disease with combined right carotid      endarterectomy for 90% carotid stenosis.  2. Diabetes mellitus.  3. Hypertension.   PRESENT ILLNESS:  The patient is a 70 year old Caucasian, ex-smoker, who  returns for his first office visit after undergoing CABG x5 3 weeks ago  for class III progressive angina with severe multivessel coronary artery  disease.  At the time of surgery, left IMA was grafted to his LAD and  vein grafts were placed to the ramus intermediate, OM1, OM2, and the  right coronary artery.  He also had a combined right carotid  endarterectomy by Dr. Tawanna Cooler Early for a 90% stenosis.  Postoperatively he  had stable and uneventful recovery and was discharged home in 4 days  without neurologic change and in a sinus rhythm.   DISCHARGE MEDICATIONS:  His discharge medications included atenolol 50  mg, glyburide 5 mg t.i.d., metformin ER 500 b.i.d., hydrochlorothiazide  25 mg, aspirin 81 mg, Lantus 4 units nightly, iron, and a short course  of Lasix and potassium, and p.r.n. oxycodone.  He is intolerant of  statins and did not take Crestor on discharge.   Since returning home, he has had no recurrent chest pains, symptoms of  CHF, fluid retention, or neurologic changes.  His surgical incisions are  healing well.  He is walking impressive distance daily.   PHYSICAL EXAMINATION:  VITAL SIGNS:  Blood pressure 130/80, pulse 80 and  regular, respirations 18, and saturation 97%.  GENERAL:  He is alert and oriented.  He is accompanied by his wife.  LUNGS:  His breath sounds are clear and equal.  CHEST:  The sternum is stable and well healed.  EXTREMITIES:  He has good pulses in extremities and leg incisions  are  well healed from bilateral leg endovein harvest.  CARDIAC:  Rhythm is regular.  He has no murmur or S3 gallop.   PA and lateral chest x-ray reveals clear lung fields without significant  pleural effusion, the sternal wires are intact and a cardiac silhouette  is stable.   IMPRESSION AND PLAN:  The patient has done well now 3 weeks after  surgery.  I told he could start driving next week and encouraged him to  attend the rehab program at [facility]  He may decide to rehab  on his own and with respect to that option, I told him that he should  plan on a 20 to 30-minute walk at least 4 times a week.  I told him to  not to lift anything more than 20 pounds until Apr 19, 2009, and to  continue his current medications.  He will make arrangements to be  followed in Dr. Jenene Slicker [facility] office in the near future as well.  I have provided him with another refill for Percocet for incisional  discomfort.  He will return as needed.   Kerin Perna, M.D.  Electronically Signed   PV/MEDQ  D:  03/07/2009  T:  03/08/2009  Job:  [id number]   cc:  Lucky Cowboy, M.D.  Rollene Rotunda, MD, [facility]

## 2011-05-04 NOTE — Procedures (Signed)
CAROTID DUPLEX EXAM   INDICATION:  Followup to right CEA.   HISTORY:  Diabetes:  Yes.  Cardiac:  No.  Hypertension:  Yes.  Smoking:  No.  Previous Surgery:  Yes, right CEA.  CV History:  No.  Amaurosis Fugax No, Paresthesias No, Hemiparesis No                                       RIGHT             LEFT  Brachial systolic pressure:         122               139  Brachial Doppler waveforms:         Triphasic         Triphasic  Vertebral direction of flow:        None identified   Antegrade  DUPLEX VELOCITIES (cm/sec)  CCA peak systolic                   47                87  ECA peak systolic                   142               137  ICA peak systolic                   77                87  ICA end diastolic                   22                32  PLAQUE MORPHOLOGY:                  Mixed             Mixed  PLAQUE AMOUNT:                      Mild              Mild  PLAQUE LOCATION:                    ECA               ICA and ECA   IMPRESSION:  1. 0-39% stenosis noted in bilateral internal carotid arteries.  2. Some reversal of flow identified in the proximal right internal      carotid artery.  3. 40-59% stenosis noted in bilateral external carotid arteries.   ___________________________________________  Larina Earthly, M.D.   CJ/MEDQ  D:  09/19/2009  T:  09/20/2009  Job:  [id number]

## 2011-05-04 NOTE — Assessment & Plan Note (Signed)
OFFICE VISIT   Anthony Holder, Anthony Holder  DOB:  July 18, 1941                                       02/28/2009  WGNFA#:[id number]   The patient presents today for follow-up of his right carotid  endarterectomy with Dacron patch angioplasty.  This was done at the same  time of coronary artery bypass graft done by Dr. Donata Clay.  He did  well, was discharged from the hospital.  Surgery was on 02/11/2009.  He  looks good today.  He is continuing to slowly improve his stamina from  the standpoint of following his coronary artery bypass grafting.  He  does have some lower extremity swelling which is moderate and improving.  He has concern regarding a couple of his mediastinal drain sites and I  have inspected these and they are healing adequately as well.  His right  neck incision looks good, he does have the usual amount of peri-  incisional numbness and this is resolving as well, he does not have any  swallowing difficulties.  He is intact neurologically.  He has not had  any focal neurologic deficits.  He reports that he has had occasions of  both eye blurry vision with some fleeting spots when he first gets up in  the morning, and this has resolved spontaneously.  I asked him just to  continue to monitor this and do not see any specific concern regarding  this visual change; certainly, it is not compatible with amaurosis.  He  will continue his usual activities.  Plan to see him again in 6 months  with a carotid duplex at that time.   Larina Earthly, M.D.  Electronically Signed   TFE/MEDQ  D:  02/28/2009  T:  03/03/2009  Job:  [id number]   cc:   Kerin Perna, M.D.  Lucky Cowboy, M.D.  Rollene Rotunda, MD, [facility]

## 2011-05-04 NOTE — Procedures (Signed)
CAROTID DUPLEX EXAM   INDICATION:  Follow up carotid artery disease.   HISTORY:  Diabetes:  Yes.  Cardiac:  In process of testing.  Hypertension:  Yes.  Smoking:  No.  Previous Surgery:  No.  CV History:  No.  Amaurosis Fugax No, Paresthesias No, Hemiparesis No.                                       RIGHT             LEFT  Brachial systolic pressure:         142               140  Brachial Doppler waveforms:         WNL               WNL  Vertebral direction of flow:        Not visualized  DUPLEX VELOCITIES (cm/sec)  CCA peak systolic                   95  ECA peak systolic                   165  ICA peak systolic                   P = 109, M = 397  ICA end diastolic                   P = 39, M = 182  PLAQUE MORPHOLOGY:                  Heterogenous  PLAQUE AMOUNT:                      Severe  PLAQUE LOCATION:                    Proximal ICA   IMPRESSION:  1. Limited study, right side only.  2. Right internal carotid artery shows evidence of 80-99% stenosis.  3. This correlates with study done at [facility].      ___________________________________________  Quita Skye Hart [place], M.D.   AS/MEDQ  D:  01/21/2009  T:  01/21/2009  Job:  [id number]

## 2011-05-04 NOTE — Procedures (Signed)
CAROTID DUPLEX EXAM   INDICATION:  Followup right carotid endarterectomy.   HISTORY:  Diabetes:  Yes.  Cardiac:  CABG.  Hypertension:  Yes.  Smoking:  Previous.  Previous Surgery:  Right CEA.  CV History:  Asymptomatic.  Amaurosis Fugax No, Paresthesias No, Hemiparesis No                                       RIGHT              LEFT  Brachial systolic pressure:         146                145  Brachial Doppler waveforms:         WNL                WNL  Vertebral direction of flow:        Antegrade          Antegrade  DUPLEX VELOCITIES (cm/sec)  CCA peak systolic                   M=63, D=227        82  ECA peak systolic                   170                200  ICA peak systolic                   144                100  ICA end diastolic                   28                 35  PLAQUE MORPHOLOGY:                  Homogeneous        Calcific/mixed  PLAQUE AMOUNT:                      Mild/moderate      Mild  PLAQUE LOCATION:                    Bifurcation/proximal ICA             ICA/ECA/CCA   IMPRESSION:  1. Right internal carotid artery velocities are suggestive of 40%-59%      stenosis.  2. Right distal common carotid artery elevated velocities.  3. Left internal carotid artery shows evidence of 1%-39%.  4. Bilateral external carotid artery stenosis.  5. Bilateral vertebral arteries appear antegrade, however, right      barely visualized.  6. Right bifurcation/proximal internal carotid artery shows evidence      of intimal hyperplasia/homogeneous plaque.   ___________________________________________  Larina Earthly, M.D.   AS/MEDQ  D:  05/01/2010  T:  05/01/2010  Job:  [id number]
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
## 2011-05-04 NOTE — Op Note (Signed)
NAMETIMMOTHY, BARANOWSKI NO.:  [id number]   MEDICAL RECORD NO.:  [id number]          PATIENT TYPE:  INP   LOCATION:  2302                         FACILITY:  [facility]   PHYSICIAN:  Kerin Perna, M.D.  DATE OF BIRTH:  1941/09/29   DATE OF PROCEDURE:  02/11/2009  DATE OF DISCHARGE:                               OPERATIVE REPORT   OPERATIONS:  1. Coronary artery bypass grafting x5 (left internal mammary artery to      LAD, saphenous vein graft to ramus intermediate, sequential      saphenous vein graft to OM-2 and OM-3, saphenous vein graft to      right coronary artery).  2. Endoscopic harvest of bilateral greater saphenous vein.   SURGEON:  Kerin Perna, MD   ASSISTANT:  Ivor Messier, PA-C   ANESTHESIA:  General.   PREOPERATIVE DIAGNOSIS:  Severe 3-vessel coronary artery disease with  90% right carotid stenosis.   POSTOPERATIVE DIAGNOSIS:  Severe 3-vessel coronary artery disease with  90% right carotid stenosis.   INDICATIONS:  The patient is a 70 year old diabetic who presented with  shortness of breath and decreased exercise tolerance.  He had  significant risk factors for atherosclerotic disease and a carotid  duplex scan demonstrated a 90% right carotid stenosis.  A stress test  was positive for ischemia and cardiac catheterization by Dr. Antoine Poche  demonstrated severe multivessel coronary artery disease with fairly well-  preserved LV function.  He is felt to be a candidate for combined  carotid endarterectomy with simultaneous multivessel coronary artery  bypass revascularization.   I examined the patient in the office and reviewed the results of the  cardiac cath and echo studies with the patient and his family.  I  discussed indications and expected benefits of multivessel coronary  artery bypass surgery for treatment of his severe 3-vessel coronary  artery disease.  I reviewed the alternatives to surgical therapy as  well.  I discussed with  him the major issues of surgery including the  location of the surgical incisions, the choice of conduit to include  internal mammary artery, and endoscopically harvested saphenous vein,  the use of general anesthesia in cardiopulmonary bypass, and the  expected postoperative hospital recovery.  I discussed with the patient  the risk to him of coronary artery bypass surgery including risk of MI,  stroke, bleeding, blood transfusion requirement, infection, and death.  After reviewing these issues, he demonstrated his understanding and  agreed to proceed with surgery under what I felt was an informed  consent.   This procedure was done in combination with a simultaneous right carotid  endarterectomy, which will be dictated in a separate document by Dr.  Gretta Began.   DESCRIPTION OF PROCEDURE:  The patient was brought to the operating  room, placed supine on the operating table where general anesthesia was  induced.  The right neck, chest, abdomen, and legs were prepped with  Betadine and draped as a sterile field.  The right carotid  endarterectomy was first performed by Dr. Arbie Cookey and will be dictated in  a separate document.  The neck  was loosely closed.  The sternal incision  was made as the saphenous vein was harvested bilaterally using the  endoscopic technique.  The left internal mammary artery was harvested as  a pedicle graft from its origin at the subclavian vessels.  It was a 1.5-  mm vessel with excellent flow.  Heparin was administered systemically  and ACT was documented as being therapeutic for bypass.  The sternal  retractor was placed and the pericardium opened and suspended.  Pursestrings were placed in the ascending aorta and right atrium and the  patient was cannulated and placed on bypass.  The coronaries were  identified for grafting and cardioplegia catheters were placed for both  antegrade and retrograde cold blood cardioplegia.  The mammary artery  and vein grafts  were prepared for the distal anastomoses, but the  patient was cooled to 32 degrees.  The aortic crossclamp was applied.  The 800 mL of cold blood cardioplegia was then delivered in split doses  between the antegrade aortic and retrograde coronary sinus catheters.  There was good cardioplegic arrest and septal temperature dropped less  than 12 degrees.  Cardioplegia was then delivered every 20 minutes or  less while the crossclamp was applied.   The distal coronary anastomoses were then performed.  First distal  anastomosis was in the distal right coronary.  This had a proximal 70%  stenosis and the anastomosis was placed in the right coronary artery  vessel before the bifurcation as the posterior descending and  posterolateral were too small to graft.  The vessel had a 1.5 mm  diameter and a reverse saphenous vein was sewn end-to-side with running  7-0 Prolene with good flow through the graft.  The second distal  anastomosis was the ramus intermediate branch of the left coronary.  This had a 1.5-mm vessel with proximal 90% stenosis.  Reverse saphenous  vein was sewn end-to-side with running 7-0 Prolene with good flow  through graft.  The third and fourth distal anastomoses consisted of a  graft to the OM-2 and OM-3.  The OM-2 was a small 1.5-mm vessel and a  side-to-side anastomosis with the vein was constructed with running 7-0  Prolene.  This was then continued to the OM-3, which was a larger 1.7-mm  vessel with proximal 70% stenosis in the end of vein sewn end-to-side  with running 7-0 Prolene with good flow through graft.  Cardioplegia was  redosed.  The fifth distal anastomosis was the distal third to LAD.  It  was heavily calcified proximally and the anastomosis was placed just  beyond the calcified plaque.  The left IMA pedicle was brought through  an opening created in the left lateral pericardium, was brought down  onto the LAD and sewn end-to-side with running 8-0 Prolene.  The  bulldog  was briefly removed and there was good flow through the anastomosis.  The bulldog was applied on the pedicle and a pedicle was secured to the  epicardium with 6-0 Prolene sutures.  Cardioplegia was redosed.   While the crossclamp was still in place, 3 proximal vein anastomoses  were performed on the ascending aorta using a 4.0-mm punch running 7-0  Prolene.  Prior to tying the final proximal anastomosis, air was vented  from the coronaries with a dose of retrograde warm blood cardioplegia.  Crossclamp was then removed and the heart resumed a spontaneous rhythm.   The cardioplegia catheters were removed.  Air was aspirated from vein  grafts with a 27-gauge needle.  The proximal and distal anastomoses were  checked and found to be hemostatic.  Temporary pacing wires were  applied.  The patient was rewarmed to 37 degrees.  The lungs re-  expanded.  The ventilator was resumed.  The patient was then weaned from  bypass without difficulty.  Blood pressure and cardiac output were  stable.  The transesophageal echo showed well-preserved LV function.  The patient received protamine without adverse reaction.  The cannulas  were removed.  The mediastinum was irrigated with warm antibiotic  irrigation.  Leg incision was closed after protamine and after platelets  as there had been significant continuous oozing from the leg incisions  as well as from the neck incision during the heart operation.  The  patient was given platelets and FFP as he required 3-4 units of packed  cells on bypass to keep his hemoglobin above 7 grams.  The coagulation  function improved after the factor transfusion.  The mediastinum was  irrigated with warm saline.  The superior pericardial fat was closed  over the aorta.  Two mediastinal and left pleural chest tube were placed  brought through separate incisions.  The sternum was closed with  interrupted steel wire.  The pectoralis fascia and the subcutaneous   layers were closed with running Vicryl.  The skin was closed with a  subcuticular.   The right neck incision was then irrigated and a round JP drain was  placed.  The platysma layer was closed with a running 2-0 Vicryl.  The  skin was closed with a running 3-0 subcuticular.  Sterile dressings were  applied.  The patient returned to the ICU in stable condition.   At the onset of the operation, a transesophageal echo probe placed  showed very mild aortic stenosis with a peak gradient of approximately  12 mmHg.  For that reason, the aortic valve was left intact as I did not  feel an AVR was indicated.      Kerin Perna, M.D.  Electronically Signed     PV/MEDQ  D:  02/11/2009  T:  02/12/2009  Job:  [id number]   cc:   Rollene Rotunda, MD, [facility]

## 2011-05-04 NOTE — H&P (Signed)
HISTORY AND PHYSICAL EXAMINATION   January 21, 2009   Re:  Anthony Holder, Anthony Holder               DOB:  [birth year]   CHIEF COMPLAINT:  Severe right internal carotid stenosis - asymptomatic.   HISTORY OF PRESENT ILLNESS:  This 70 year old male patient had some  abnormal chest discomfort about 6 weeks ago and was referred by Dr.  Oneta Rack to Dr. Antoine Poche for cardiac evaluation.  A right carotid bruit  was heard and a carotid duplex exam was ordered at [facility]  which revealed a 90% right internal carotid stenosis and a 50% left  internal carotid stenosis.  The patient denies any symptoms of  hemispheric or nonhemispheric ischemia, amaurosis fugax, diplopia,  blurred vision, syncope or other neurologic symptoms and he is scheduled  for elective right carotid endarterectomy following a Cardiolite stress  test.  He denies any active chest pain, dyspnea on exertion, PND or  orthopnea at this time and has no history of previous myocardial  infarction or cardiac problems.   PAST MEDICAL HISTORY:  1. Diabetes mellitus.  2. Hypertension.  3. Hyperlipidemia.  4. Degenerative disk disease.  5. Negative for coronary artery disease, CVA or COPD.   PAST SURGICAL HISTORY:  1. Lumbar laminectomy x2 procedures.  2. Cholecystectomy.   FAMILY HISTORY:  Positive for diabetes in multiple uncles, negative for  coronary artery disease or stroke.  Mother died at age 37 of leukemia.   SOCIAL HISTORY:  He is married and has five children and is retired.  He  has not smoked cigarettes since 1958 and does not use alcohol.   REVIEW OF SYSTEMS:  Negative for any anorexia, weight loss,  palpitations, orthopnea but has had some atypical chest discomfort.  PULMONARY:  No productive cough, bronchitis.  GI:  No symptoms.  GU:  No symptoms.  EXTREMITIES:  No deep venous thrombosis.  Denies claudication.   ALLERGIES:  None known.   MEDICATIONS:  1. Atenolol 100 mg daily.  2. Glyburide  5 mg daily.  3. Metformin not sure of the dose mg daily.  4. Hydrochlorothiazide 25 mg daily.  5. Vitamin E.  6. B12.  7. B3.  8. Magnesium.  9. Omega-3.  10.Aspirin daily.   PHYSICAL EXAMINATION:  Vital signs:  Blood pressure 134/79, heart rate  71, respirations 16.  General:  He is alert and oriented x3.  Neck:  Supple, 3+ carotid pulses.  There is a harsh bruit on the right.  No  bruit on the left.  Neurologic:  Normal.  No palpable adenopathy in the  neck.  Chest:  Clear to auscultation.  Cardiovascular:  Regular rhythm.  No murmurs.  Abdomen:  Soft, nontender with no masses.  He has 3+  femoral pulses bilaterally, well-perfused lower extremities.   IMPRESSION:  1. Severe right internal carotid stenosis - asymptomatic.  2. Diabetes mellitus.  3. Hypertension.  4. Hyperlipidemia.   PLAN:  To obtain a Cardiolite study by Dr. Antoine Poche who has evaluated  the patient to rule out significant coronary artery disease and to  proceed with right carotid endarterectomy on February 12 assuming  cardiac workup is negative.  Risks and benefits have been thoroughly  discussed with the patient and he would like to proceed.   Quita Skye Hart Rochester, M.D.  Electronically Signed   JDL/MEDQ  D:  01/21/2009  T:  01/22/2009  Job:  [id number]   cc:   Rollene Rotunda, MD, [facility]  Lucky Cowboy, M.D.

## 2011-05-07 NOTE — Consult Note (Signed)
Anthony Holder, Anthony Holder NO.:  [id number]   MEDICAL RECORD NO.:  [id number]          PATIENT TYPE:  OIB   LOCATION:  5003                         FACILITY:  [facility]   PHYSICIAN:  Hillery Aldo, M.D.   DATE OF BIRTH:  [birth year]   DATE OF CONSULTATION:  DATE OF DISCHARGE:                                   CONSULTATION   PRIMARY CARE PHYSICIAN:  Lucky Cowboy, M.D.   REQUESTING PHYSICIAN:  Jene Every, M.D.   REASON FOR CONSULTATION:  Internal medicine was asked to see this 70-year-  old male by Dr. Shelle Iron for diabetes and hypertension control.   HISTORY OF PRESENT ILLNESS:  The patient is a 70 year old male who was  admitted by Dr. Shelle Iron on September 13, 2005, for central decompression of L3  and L4 along with bilateral hemilaminectomies and foraminotomies secondary  to recurrent stenosis and ongoing problems with radiculopathy.  The patient  had previously undergone a lumbar laminectomy and microdiskectomy on December 25, 2004.  He was doing well until recently when he developed increasing  lower extremity radicular pain bilaterally but worse on the left.  He is now  status post the above-mentioned surgery and his postoperative course has  been complicated by hyperglycemia, hyponatremia, and hypotension.   PAST MEDICAL HISTORY:  1.  Hypertension.  2.  Noninsulin-dependent diabetes x12-15 years.  3.  Status post stress test in August 2006, which showed no reversible      perfusion defects with an ejection fraction of 51%.  4.  Dyslipidemia.  5.  Osteoarthritis.  6.  Status post tonsillectomy and adenoidectomy.  7.  Status post lumbar laminectomy and repeat central decompression.   CURRENT MEDICATIONS:  1.  Atenolol 50 mg daily.  2.  Lipitor 80 mg daily.  3.  Glyburide 5 mg t.i.d.  4.  Hydrochlorothiazide 25 mg daily.  5.  Lantus insulin 20 units subcutaneously daily and sliding scale insulin      t.i.d.  6.  Lisinopril 40 mg daily.  7.  Glucophage 500  mg daily.  8.  Actos 30 mg daily.  9.  Morphine 2-4 mg q.2h. p.r.n. IV.  10. Tylenol 650 mg q.4h. p.r.n.  11. Valium 5 mg q.6h. p.r.n.  12. Maalox 30 mL q.4h. p.r.n.  13. Zofran 4 mg q.4h. p.r.n.   ALLERGIES:  VICODIN and CODEINE.   FAMILY HISTORY:  The patient's family history is positive for hypertension  and diabetes.   SOCIAL HISTORY:  The patient is married and lives in [place] with his  wife.  No tobacco, alcohol or drugs.  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   REVIEW OF SYSTEMS:  He has had some steady weight gain over time.  No chest  pain.  No shortness of breath or cough.  No changes in his bowel habits,  melena or hematochezia.  No dysuria.  He has had some back pain and some  various paresthesias.   PHYSICAL EXAMINATION:  VITAL SIGNS:  Temperature 99.8, blood pressure  111/70, pulse 85, respirations 20,  O2 saturation 98% on room air.  GENERAL:  A well-developed, slightly obese male in no acute distress.  HEENT:  Normocephalic, atraumatic.  PERRL.  EOMI.  Oropharynx is clear.  NECK:  Supple, no thyromegaly, no lymphadenopathy, no jugular venous  distention.  CHEST:  Decreased breath sounds at the bases but good air movement, clear to  auscultation bilaterally.  CARDIAC:  Regular rate and rhythm.  No murmurs, rubs or gallops.  ABDOMEN:  Soft, nontender, nondistended, with normoactive bowel sounds.  EXTREMITIES:  No clubbing, edema or cyanosis.  SKIN:  Without rash.   LABORATORY DATA:  Hemoglobin is 11.8, hematocrit 34.5.  Sodium 126,  potassium 4.0, chloride 95, bicarb 27, BUN 20, creatinine 1.3, glucose 262.   ASSESSMENT AND PLAN:  1.  Hyponatremia.  This most likely reflects hyperglycemia and      pseudohyponatremia resulting from the osmotic effects of hyperglycemia.      It could also represent uncontrolled hyperlipidemia, so we will check a      fasting lipid panel in the  morning.  He likely has some free water      excess, and therefore I agree with the change in his IV fluids to normal      saline.  2.  Hyperglycemia/diabetes.  The stress of recent surgery likely is causing      increased insulin resistance.  Our goal is to keep his glucose greater      than 90 and less than 20.  This will avoid the risk of dehydration,      calorie loss and glycosuria and also help decrease the risk of      postoperative infection.  He is currently receiving 20 units of basal      insulin and sliding scale insulin t.i.d.  We will change this to q.4h.      with sliding scale coverage and check a hemoglobin A1c to determine what      his prehospital glycemic control was.  There is some discrepancy      regarding medications the patient says he takes and the medications      listed in Dr. Kathryne Sharper chart.  Given this, we will continue his current      oral regimen but this will need to be clarified further upon discharge.  3.  Hypertension/hypotension.  The patient's blood pressure is actually low      postoperatively and likely reflects medication dosages that are      different from what his normal regimen is.  Dr. Kathryne Sharper chart does not      indicate the patient was taking any hydrochlorothiazide or lisinopril.      We will discontinue the hydrochlorothiazide for now.  It is reasonable      to continue with lisinopril given his concurrent diabetes.  4.  Status post lumbar surgery.  Management per the primary team.  5.  Prophylaxis.  The patient should be initiated on DVT and GI prophylaxis      while in the hospital.   Thank you for this consultation.  We will follow the patient with you.           ______________________________  Hillery Aldo, M.D.     CR/MEDQ  D:  09/15/2005  T:  09/16/2005  Job:  [id number]   cc:   Lucky Cowboy, M.D.  Fax: [phone]   Jene Every, M.D.  Fax: [phone]

## 2011-05-07 NOTE — Discharge Summary (Signed)
Anthony, Holder NO.:  [id number]   MEDICAL RECORD NO.:  [id number]          PATIENT TYPE:  INP   LOCATION:  5003                         FACILITY:  [facility]   PHYSICIAN:  Jene Every, M.D.    DATE OF BIRTH:  30-Dec-1940   DATE OF ADMISSION:  09/13/2005  DATE OF DISCHARGE:  09/16/2005                                 DISCHARGE SUMMARY   ADMISSION DIAGNOSES:  1.  Severe spinal stenosis, L3-4 and L5-S1.  2.  Non-insulin-dependent diabetes.  3.  Hypertension.  4.  Coronary artery disease.  5.  Dyslipidemia.  6.  Osteoarthritis.   DISCHARGE DIAGNOSES:  1.  Severe stenosis, status post central decompression L3-4 and L5-S1.  2.  Hyponatremia secondary to hyperglycemia.  3.  Hypotension, resolved.  4.  Non-insulin-dependent diabetes.  5.  Coronary artery disease.  6.  Dyslipidemia.  7.  Osteoarthritis.   CONSULTATIONS:  1.  PT/OT.  2.  Case management.  3.  [facility].   PROCEDURE:  The patient was taken to the OR on September 25th to undergo  lumbar decompression, L3-4 and L5-S1; surgeon Jene Every, M.D.; assistant  Roma Schanz, PA-C; anesthesia, general; complications none.   HISTORY:  Mr. Berhane is a 70 year old gentleman with a longstanding  history of low back pain. He had previously undergone central decompression  in January 2006. However, he was unhappy with this outcome and sought a  second opinion by Dr. Shelle Iron. He was evaluated and a diagnosis of severe  stenosis of L3-5, L5-S1. The patient did not wish to undergo cortisone  injections. Treatment options were discussed with the patient. It was  recommended that he would need central decompression. The risks and benefits  of the surgery with the patient and medical clearance obtained.  The patient  was scheduled.   LABORATORY DATA:  Preoperative chest x-ray showed mild pleural scarring,  changes seen within the left side laterally. No evidence of active chest  disease was  noted. Preoperative CBC showed a white blood cell count 10.0,  hemoglobin 14, hematocrit 41.7.  H&H was followed throughout the hospital  course. At the time of discharge hemoglobin was 11.8, hematocrit 34.5.  Routine chemistries done preoperatively showed a sodium of 135, potassium  4.5, glucose 274, BUN 14, creatinine 1.3. This was followed throughout the  hospital course. The patient's sodium did drop to a low of 126, potassium  4.0. Sodium was secondary to hyperglycemia. Glucose remained elevated and at  the time of discharge glucose was 261 with a slightly decreased calcium of  8.2. I do not see a preoperative EKG on the chart.   HOSPITAL COURSE:  The patient was admitted, taken to the OR, and underwent  the above-stated procedure. He was then transferred to the PACU and then to  the orthopedic floor for postoperative care. Postoperatively he did fairly  well. He noted some low back deformity as suspected, but noted improvement  in his lower extremity pain. He was slight febrile on postoperative day #1,  but this improved. Hemovac was placed intraoperatively.  The patient was  placed on sliding scale for his  hyperglycemia. Antihypertensives were held  secondary to hypotension. PT and OT were consulted. Incentive spirometer was  encouraged. On postoperative day #2, the patient was not doing quite so  well.  He noted multiple complaints and tingling in his left arm, with  slight increase in pain after he coughed. He states he was only out of bed  __________ and postoperative day #1 he was voiding and passing flatus  without difficulty. He denied chest pain or shortness of breath. Vital signs  remained stable. He did have a low grade temperature of 99.8, hemoglobin  stable at 11.8, hematocrit 30.5.  Sodium was decreased at 126, however his  glucose was 262. CBG on day four showed a level of 345.  Motor neurovascular  function remained intact in both lower extremities. Calf was soft,   nontender. Incision was clean, dry, and intact. In the upper extremities,  sensation was intact. Motor function was good. __________ soft, nontender to  palpation of the __________ range of motion of the cervical spine.   At this point we did consult [facility] for management of his hypotension as  well as hyperglycemia. Treatment and alteration of medication were deferred  to them. On postoperative day #3, the patient did improve. He had been  __________ without significant difficulty. It was felt at this point when  stable and cleared by internal medicine that he could be discharged home.   DISPOSITION:  Discharged home with home health physical therapy and  continued close follow-up by his medical physician for his diabetes as well  as hypotension. He is to follow up with Dr. Shelle Iron in approximately seven to  ten days for suture removal.   ACTIVITY:  He is to walk as tolerated. He is to use proper back precautions.   DIET:  Low carbohydrate, low calorie diet. Diabetes instructions were given.   DISCHARGE MEDICATIONS:  Include all home medications as dictated per  [facility]. The patient was discharged on Percocet one to two  p.o. q.4-6h. p.r.n. pain.   CONDITION ON DISCHARGE:  Stable.   FINAL DIAGNOSIS:  Status post central decompression L3-4 and L5-S1.      Roma Schanz, P.A.      Jene Every, M.D.  Electronically Signed    CS/MEDQ  D:  12/14/2005  T:  12/14/2005  Job:  [id number]

## 2011-05-07 NOTE — Op Note (Signed)
Anthony Holder, Anthony Holder NO.:  [id number]   MEDICAL RECORD NO.:  [id number]          PATIENT TYPE:  OIB   LOCATION:  5003                         FACILITY:  [facility]   PHYSICIAN:  Jene Every, M.D.    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   PROCEDURE PERFORMED:  1.  Central decompression, re-do central decompression L3-4, bilateral      hemilaminotomies, and foraminotomies L3 and L4.  2.  Lateral recess decompression, foraminotomy S1.  3.  Hemilaminotomy L5 S1.   ANESTHESIA:  General.   ASSISTANT:  Gioffre.   BRIEF INDICATION:  This is a 70 year old male who is status post a  decompression at L3-4 in the past with a microdiskectomy.  Patient had done  initially well but then developed increasing lower extremity radicular pain  bilaterally left greater than right.  A repeat MRI performed which indicated  some fibrosis and some mild stenosis at 3-4 but no evidence of significant  neural compressive lesion, he did have some lateral recess stenosis at L4 S1  on the left.  He did have an epidural steroid injection 3-4, giving him  temporary relief, as was the injection at L5 S1 on the left.  We then  obtained a flexion extension CT myelogram which demonstrated a complete  block at L3-4 with the patient in neutral and extension.  This was confirmed  with the patient's symptomatology and fairly limited neurogenic  claudication.  The initial interpretation by radiology indicated moderate  stenosis; however, I discussed this with Dr. Barrington Ellison and in re-evaluation of  the lateral and extension view agreed it was a significant block on the  myelogram.  I felt that decompression at 3-4 centrally then L5 S1 on  the  left would alleviate the majority of the patient's radicular symptoms and  the neurogenic claudication.  Flexion extension revealed no significant  instability at 3-4.  I, therefore, did not recommend concomitant fusion.  He  did have some back pain but not severe.  Discussed the risks and benefits of  the procedure including bleeding, infection, __________ , CSF leakage,  epigeal fibrosis, adjacent segment disease, need for fusion in the future,  no help in his symptoms, worsening in the symptoms, etc.   TECHNIQUE:  With the patient in the supine position, after the induction of  adequate general anesthesia and 1 g of Kefzol he was placed prone on the  Stamps frame, all bony prominences well padded.  The lumbar region was  prepped and draped in the usual sterile fashion.  A previous surgical  incision was utilized.  Subcutaneous tissue was __________ to achieve  hemostasis.  Extended the incision slightly cephalad 2 cm and caudad 2 cm as  well.  Initial radiographs obtained.  Had a Kocher placed on the spinous  process of 4 and S1.  Continued that for an additional cm cephalad and  divided the dorsal lumbar fascia on either side of what was presumed to be  the spinous process of 3.  Also placed one on the spinous process L4 and L5,  this was confirmed by x-ray.  Next, the dorsolumbar fascia was divided on  outer line of the skin incision and on the outer line of the interspinous  ligament at 3 and 4, paraspinous muscles were elevated and McCullough  retractor was placed.  In addition, the paraspinous musculature was elevated  from the lamina at L5 S1 on the left.  Color retractor was placed there as  well.  Operating microscope was draped and brought into the surgical field.  We turned our attention first to L5 S1 on the left.  Identified the sacrum,  spinous process.  Ligamentum flavum detached from the cephalad edge of S1  with a straight curet, a foraminotomy was S1 was performed  with and a 2 and  a 3 mm Kerrison.  Hypertrophic ligamentum flavum was found and was removed  from the interspace.  There was significant lateral recess stenosis noted  here on the left and a lateral recess decompression was performed with a 2  and a 3 mm Kerrison.  We carried the decompression cephalad to the  attachment of the ligamentum flavum on the undersurface of 5, a  hemilaminotomy on the caudad edge of 5 was performed.  I examined the disk,  there was no evidence of rupture and we identified the root, mobilized it  medially, 1 cm medial to the pedicle, without difficulty.  __________ widely  patent, there was no disk herniation beneath the thecal sac or in the  axilla.  I felt this was mainly lateral recess stenosis secondary to facet  hypertrophy and ligamentum flavum hypertrophy.  Laminotomy was copiously  irrigated and placed thrombin soaked Gelfoam in the laminotomy defect, no  CSF leakage or active bleeding.  Attention was then turned towards the 3-4  space.  A Leksell rongeur utilized to remove the spinous process of 3,  partial of 4.  Spinous process was removed from the interspace between.  Epidural fibrosis was noted at 3-4 on left.  This was the previous operative  site.  Attention was turned towards the nonoperative side on the right.  Using a 2 mm Kerrison, performed hemilaminotomy of the caudad edge of 3 on  the right, detaching the ligamentum.  There was significant ligamentum  flavum hypertrophy noted here centrally.  The ligamentum flavum detached  from the cephalad edge of 4 utilizing a 2 and 3 mm Kerrison and performed a  foraminotomy of 4.  I decompressed the lateral recess here at 3-4, removing  the ligamentum flavum from the interspace.  Cephalad, this was carried to  the mid portion of the pedicle of 3, as on his myelogram the block extended  to the inferior border of the pedicle of 3.  After this was performed, we moved to the left and again on utilizing the  operative microscope we  detached the ligamentum flavum and epidural fibrosis from the cephalad edge  of 4 and using a 2 and 3 mm Kerrison performed a 4 foraminotomy.  Following  this, removed the ligamentum flavum from the caudad edge of 3 cephalad to  the previous area of epidural fibrosis.  We carried out decompression  cephalad to just above the attachment of the ligamentum flavum.  Again,  significant central and lateral recess stenosis secondary to  ligamentum  flavum hypertrophy.  Performed through the medial aspect of the facet,  approximately 20% of the facet.  The area was not significantly unstable as  determined intraoperatively.  There was significant epidural fibrosis noted  into the lateral recess.  There was a small facet cyst extending from the  facet.  I utilized a curet to attach the epidural fibrosis and ligamentum  from the facet and laterally.  This was decompressed with the 2 mm Kerrison.  Using a hockey stick probe, the foramen of 3 and 4 were found to be widely  patent, there was one area of thecal sac that was significantly adherent to  the medium port of the facet and it was felt prudent to not attach this due  to the greater risk of epidural leak.  It was felt that the 3 and 4 roots  were freed cephalad and proximal and that at least on the myelogram there  was no lateral recess stenosis noted.  There was mainly a central block and  I felt that was already decompressed from the central decompression.  The  thecal sac was expanded, there was no CSF leakage or active bleeding and the  hockey stick probe placed cephalad and caudad beneath the __________ 3 and 4  found to be widely patent.  No evidence of slipped vertebra.  Bipolar  cautery was utilized to achieve strict hemostasis, bone wax was placed on  the cancellous surfaces, no active bleeding.  I have examined the disk, no  evidence of disk herniation was noted.  I placed thrombin soaked Gelfoam in  laminotomy  defect.  __________ retractor, paraspinous muscles was inspected,  no evidence of active bleeding, irrigated it copiously, placed a Hemovac and  brought it out through a lateral stab wound in the skin.  Dorsal lumbar  fascia reapproximated with #1 Vicryl and interrupted figure-of-eight  sutures, subcutaneous tissue reapproximated with 2-0 Vicryl simple sutures,  the skin was reapproximated with staples, the wound was dressed sterilely.  Laid supine on the hospital bed, extubated without difficulty and  transported to the recovery room in satisfactory condition.   Patient tolerated the procedure well with no complications.   BLOOD LOSS:  100 mL.   ASSISTANT:  Dr. Darrelyn Hillock.      Jene Every, M.D.  Electronically Signed     JB/MEDQ  D:  09/13/2005  T:  09/14/2005  Job:  [id number]

## 2011-05-07 NOTE — Discharge Summary (Signed)
NAMEDAVED, MCFANN NO.:  [id number]   MEDICAL RECORD NO.:  [id number]          PATIENT TYPE:  OIB   LOCATION:  3040                         FACILITY:  [facility]   PHYSICIAN:  Stefani Dama, M.D.  DATE OF BIRTH:  [birth year]   DATE OF ADMISSION:  12/25/2004  DATE OF DISCHARGE:  12/27/2004                                 DISCHARGE SUMMARY   ADMITTING DIAGNOSES:  Lumbar spondylosis L3-L4 with lumbar radiculopathy.   DISCHARGE DIAGNOSES:  Lumbar spondylosis L3-L4 with lumbar radiculopathy.   OPERATION:  Lumbar laminectomy L3-L4, diskectomy with microscope on December 25, 2004.   CONDITION ON DISCHARGE:  Improving.   ADDITIONAL DISCHARGE DIAGNOSES:  Diabetes mellitus.   HOSPITAL COURSE:  Mr. Acree is a 70 year old individual who has had  significant back pain and lumbar radiculopathy.  He has a large extruded  fragment at the level of L3-L4.  He was advised regarding surgical  decompression.  This was undertaken on December 25, 2004.  Postoperatively the  patient had good relief of pain.  However, it was noted at eight hours after  surgery that he had a marked spike in his blood glucose to low 300s.  This  was followed by ever increasing blood sugar to the 400 level.  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  In  any event, he was given insulin after his blood sugar reached 400 and he  required repeated doses over the evening of January 7 until it came into  range with a blood sugar of 180 on the morning of January 8.  At this point  the patient's blood sugars proceeded to decrease less than 150 and he will  be discharged home.  He has a known regimen for taking care of his blood  sugar using a number of prescription medications in addition to cinnamon and  vitamin C.  His condition is improving.  His incision is clean and dry.  He  is given a  prescription for Percocet for pain and Valium as a muscle  relaxant.      Henr   HJE/MEDQ  D:  12/27/2004  T:  12/27/2004  Job:  [id number]

## 2011-05-07 NOTE — Op Note (Signed)
NAMEZAYLYN, Anthony Holder NO.:  [id number]   MEDICAL RECORD NO.:  [id number]          PATIENT TYPE:  OIB   LOCATION:  2899                         FACILITY:  [facility]   PHYSICIAN:  Hilda Lias, M.D.   DATE OF BIRTH:  [birth year]   DATE OF PROCEDURE:  12/25/2004  DATE OF DISCHARGE:                                 OPERATIVE REPORT   PREOPERATIVE DIAGNOSES:  Left L3-4 herniated disk with L3 radiculopathy.   POSTOPERATIVE DIAGNOSES:  Left L3-4 herniated disk with L3 radiculopathy.   PROCEDURE:  Left L3-4 diskectomy, removal of large fragment compromising the  L3 nerve root, decompression of the L3-L4 nerve roots.  Microscope used.   SURGEON:  Hilda Lias, M.D.   ASSISTANT:  Stefani Dama, M.D.   ANESTHESIA:  General.   INDICATIONS FOR PROCEDURE:  The patient was admitted to the hospital because  of back and left leg pain. X-rays showed that he has a herniated disk at L3-  4. Surgery was advised, the risks were explained in history and physical.   DESCRIPTION OF PROCEDURE:  The patient was taken to the OR and he was  positioned in a prone manner.  Drapes were applied. Midline incision between  L3 and L4 was made, and muscle was retracted laterally. X-ray showed that  indeed, we were in the lower part of the  L3-4 level.  Then with the  microscope, we drilled the lower lamina of L3, the upper of L4 and  one-third of the medial facet.  The annular ligament was not excised.  We  retracted the dural sac and indeed, there was a herniated disk at the level  of L3-4 with extension superiorly, with a fragment compromising the L3 nerve  root.  Removal of fragment was made.   There was an opening in the disk space and we went straight into the disk  space with removal of a large amount of degenerative disk disease, medially  and laterally. Curets were used to remove most of the disk.  In the end, we  had a good decompression of the thecal sac as well as the L3-L4 nerve  roots.  Foraminotomy was accomplished.  Hemostasis was done with bipolar.  Valsalva maneuver was negative.  Fentanyl  and Depo-Medrol were left in the dural space.  The wound was closed with  Vicryl and Steri-Strips.       EB/MEDQ  D:  12/25/2004  T:  12/25/2004  Job:  [id number]

## 2011-06-12 LAB — HM COLONOSCOPY: HM Colonoscopy: NORMAL

## 2011-07-16 ENCOUNTER — Other Ambulatory Visit (INDEPENDENT_AMBULATORY_CARE_PROVIDER_SITE_OTHER): Payer: Medicare Other

## 2011-07-16 DIAGNOSIS — Z48812 Encounter for surgical aftercare following surgery on the circulatory system: Secondary | ICD-10-CM

## 2011-07-16 DIAGNOSIS — I6529 Occlusion and stenosis of unspecified carotid artery: Secondary | ICD-10-CM

## 2011-07-21 NOTE — Procedures (Unsigned)
CAROTID DUPLEX EXAM  INDICATION:  Follow up right CEA performed 02/11/2009.  HISTORY: Diabetes:  Yes. Cardiac:  Yes. Hypertension:  Yes. Smoking:  Previous. Previous Surgery:  Right CEA. CV History: Amaurosis Fugax No, Paresthesias No, Hemiparesis No.                                      RIGHT             LEFT Brachial systolic pressure:         142               130 Brachial Doppler waveforms:         WNL               WNL Vertebral direction of flow:        Retrograde        Antegrade DUPLEX VELOCITIES (cm/sec) CCA peak systolic                   153               185 ECA peak systolic                   Not well visualized                 180 ICA peak systolic                   88                92 ICA end diastolic                   22                37 PLAQUE MORPHOLOGY:                  Hyperplasia       Heterogenous PLAQUE AMOUNT:                      Mild              Minimal PLAQUE LOCATION:                    CEA               CCA/ICA  IMPRESSION: 1. Patent right carotid endarterectomy with mild hyperplasia at the     start point in the distal common carotid artery. 2. 1% to 39% left internal carotid artery stenosis. 3. Retrograde right vertebral artery with a normal right subclavian     artery. 4. Antegrade left vertebral artery.  ___________________________________________ Larina Earthly, M.D.  LT/MEDQ  D:  07/16/2011  T:  07/16/2011  Job:  [id number]

## 2011-09-08 LAB — PREPARE PLATELET PHERESIS

## 2012-02-23 ENCOUNTER — Encounter (HOSPITAL_BASED_OUTPATIENT_CLINIC_OR_DEPARTMENT_OTHER): Payer: Medicare Other | Attending: Plastic Surgery

## 2012-02-23 DIAGNOSIS — E1169 Type 2 diabetes mellitus with other specified complication: Secondary | ICD-10-CM | POA: Insufficient documentation

## 2012-02-23 DIAGNOSIS — L97509 Non-pressure chronic ulcer of other part of unspecified foot with unspecified severity: Secondary | ICD-10-CM | POA: Insufficient documentation

## 2012-02-24 NOTE — Progress Notes (Signed)
[facility]  NAME:  Anthony Holder, Anthony Holder NO.:  [id number]  MEDICAL RECORD NO.:  [id number]      DATE OF BIRTH:  Dec 31, 1940  PHYSICIAN:  Wayland Denis, DO       VISIT DATE:  02/23/2012                                  OFFICE VISIT   HISTORY:  The patient is a 71 year old white male, who is here for evaluation of his left second toe.  He was noted to have some redness and swelling on the area and was sent for evaluation.  He has not really been doing anything particular to it.  He was recently admitted to [facility] for a stroke that involved the left side of his body.  He had limb and face involvement with numbness and tingling, which brought him to the emergency room.  He was treated for that and discharged a week ago.  He is not sure how long the wound with there because of his recent events as well as being able to feel his lower extremities.  He says that it does look a little bit better than it did last week.  PAST MEDICAL HISTORY:  Positive for diabetes, hypertension, hyperlipidemia, coronary artery disease and peripheral arterial disease.  PAST SURGICAL HISTORY:  Coronary artery bypass, cholecystectomy, back fusion, carotid endarterectomy.  MEDICATIONS:  Include metformin, glyburide, atenolol, diclofenac, __________, aspirin, Lasix and multiple vitamin.  SOCIAL HISTORY:  The patient is married and denies any tobacco or alcohol use.  FAMILY HISTORY:  Positive for diabetes and hypertension as well.  REVIEW OF SYSTEMS:  Denies any recent upper respiratory illness; however, he did have stroke that I mentioned above with weakness and numbness, otherwise negative.  PHYSICAL EXAMINATION:  GENERAL: He is alert, oriented, cooperative, not in any acute distress.  HEENT: His pupils are equal.  His extraocular muscles are intact.  No cervical lymphadenopathy.  His breathing is unlabored.  HEART: Regular.  ABDOMEN:  Soft, but large.  EXTREMITIES: His lower extremities have some mild swelling with varicosities noted.  He has redness and swelling in the left second toe with ulceration at the tip. SKIN: Quite dry.  He has a little bit of contracture of the second toe, which may be contributing to the pressure on the tip. There is no drainage is noted.  ASSESSMENT:  Severe peripheral vascular disease with foot ulcer of second toe, complicated by diabetes. Recommend offloading multivitamin elevation, increase protein, blood sugar control, check a prealbumin in the next visit. Aquacel Ag and a wrap. Depending on how it looks next week, we may be able to put him on Profore Lite or Una boots.     Wayland Denis, DO     CS/MEDQ  D:  02/23/2012  T:  02/24/2012  Job:  [id number]

## 2012-03-02 NOTE — Progress Notes (Signed)
[facility]  NAME:  Anthony, Holder NO.:  [id number]  MEDICAL RECORD NO.:  [id number]      DATE OF BIRTH:  01-03-1941  PHYSICIAN:  Wayland Denis, DO       VISIT DATE:  03/01/2012                                  OFFICE VISIT   The patient is a 71 year old white male here for followup on his left second toe ulceration.  He has been using Aquacel Ag.  His antibiotics were changed from doxycycline to Keflex due to cough and his primary physician is managing that.  There are no other changes in his medications.  SOCIAL HISTORY:  Unchanged.  REVIEW OF SYSTEMS:  Negative.  PHYSICAL EXAMINATION:  GENERAL:  He is alert, oriented, cooperative, not in any acute distress.  He is very pleasant. HEENT:  Pupils are equal.  Extraocular muscles are intact.  No cervical lymphadenopathy. CHEST:  His breathing is unlabored. HEART:  Regular. ABDOMEN:  Soft.  There is slight improvement in the overall appearance of his foot with decreased swelling and redness.  There still is some redness, so we definitely want to keep an eye on that.  We will continue with the Aquacel Ag and have him follow up in a week.     Wayland Denis, DO     CS/MEDQ  D:  03/01/2012  T:  03/02/2012  Job:  [id number]

## 2012-03-08 ENCOUNTER — Other Ambulatory Visit (HOSPITAL_COMMUNITY): Payer: Self-pay | Admitting: Plastic Surgery

## 2012-03-08 ENCOUNTER — Ambulatory Visit (HOSPITAL_COMMUNITY)
Admission: RE | Admit: 2012-03-08 | Discharge: 2012-03-08 | Disposition: A | Discharge status: Home / Self Care | Payer: Medicare Other | Source: Ambulatory Visit | Attending: Plastic Surgery | Admitting: Plastic Surgery

## 2012-03-08 DIAGNOSIS — S91309A Unspecified open wound, unspecified foot, initial encounter: Secondary | ICD-10-CM | POA: Insufficient documentation

## 2012-03-08 DIAGNOSIS — T148XXA Other injury of unspecified body region, initial encounter: Secondary | ICD-10-CM

## 2012-03-08 DIAGNOSIS — I10 Essential (primary) hypertension: Secondary | ICD-10-CM | POA: Insufficient documentation

## 2012-03-08 DIAGNOSIS — X58XXXA Exposure to other specified factors, initial encounter: Secondary | ICD-10-CM | POA: Insufficient documentation

## 2012-03-08 DIAGNOSIS — E119 Type 2 diabetes mellitus without complications: Secondary | ICD-10-CM | POA: Insufficient documentation

## 2012-03-08 DIAGNOSIS — Z8673 Personal history of transient ischemic attack (TIA), and cerebral infarction without residual deficits: Secondary | ICD-10-CM | POA: Insufficient documentation

## 2012-03-08 DIAGNOSIS — Z951 Presence of aortocoronary bypass graft: Secondary | ICD-10-CM | POA: Insufficient documentation

## 2012-03-09 NOTE — Progress Notes (Signed)
[facility]  NAME:  Anthony Holder, Anthony Holder NO.:  [id number]  MEDICAL RECORD NO.:  [id number]      DATE OF BIRTH:  06-11-1941  PHYSICIAN:  Wayland Denis, DO       VISIT DATE:  03/08/2012                                  OFFICE VISIT   Anthony Holder is a 71 year old gentleman who is here for a followup on his left second toe diabetic foot ulcer.  He has been using Aquacel AG and has had decrease in the redness and swelling, but the eschar has still been present.  SOCIAL HISTORY:  Unchanged.  REVIEW OF SYSTEMS:  Negative.  No change in medications.  PHYSICAL EXAMINATION:  He is alert, oriented, and cooperative, not in any acute distress.  Pupils are equal.  Extraocular muscles are intact. The wound was debrided and those notes are noted and there was a small amount of drainage noted and bone exposed.  This is concerning for Wagner 3 ulcer and progressive infection.  We would like to have him evaluated and we sent bone cultures, do some films and consult for evaluation of this to prevent it from proximal migration.  We will see him back in a week.  Due to his diabetes and the [facility] 3 stage, I think he would benefit greatly from hyperbaric oxygen treatment in order to salvage his limb and prevent limb loss that would require lower extremity amputation.     Wayland Denis, DO     CS/MEDQ  D:  03/08/2012  T:  03/09/2012  Job:  [id number]

## 2012-03-15 NOTE — Progress Notes (Signed)
[facility]  NAME:  Anthony Holder, Anthony Holder NO.:  MEDICAL RECORD NO.:  [id number]      DATE OF BIRTH:  Apr 24, 1941  PHYSICIAN:  Wayland Denis, DO       VISIT DATE:  03/15/2012                                  OFFICE VISIT   The patient is a 71 year old gentleman who is here for followup on his left lower extremity diabetic foot ulcer of the second toe.  He has been using alginate over this past week.  There is not much changed.  There has been no change in his antibiotics.  No change in his medications or social history.  On exam, he is alert, oriented, cooperative, not in any acute distress. He is pleasant and shows appropriate concern for his foot.  His pupils are equal.  Extraocular muscles are intact.  No cervical lymphadenopathy.  His breathing is unlabored.  His heart is regular. His abdomen is large, but soft.  His wound has not much changed from last week.  Quite a bit of debridement was done and that is noted in the chart.  The bone was rongeured down and sent for Gram stain culture and sensitivity.  We will also send him for a pre-albumin to check his nutrition level and Dr. Victorino Dike for evaluation of the area as we do not want this would lead to a more proximal amputation.  We did discuss with him that amputation was a real possibility and he seems to understand.  HBO is recommended as he has a Educational psychologist III diabetic foot ulcer.  He is a risk of amputation.  He has failed conservative treatment and is at risk of a more proximal infection due to the bone involvement.       Wayland Denis, DO     CS/MEDQ  D:  03/15/2012  T:  03/15/2012  Job:  [id number]

## 2012-03-16 ENCOUNTER — Other Ambulatory Visit: Payer: Self-pay | Admitting: Plastic Surgery

## 2012-03-22 ENCOUNTER — Encounter (HOSPITAL_BASED_OUTPATIENT_CLINIC_OR_DEPARTMENT_OTHER): Payer: Medicare Other

## 2012-03-23 ENCOUNTER — Encounter (HOSPITAL_BASED_OUTPATIENT_CLINIC_OR_DEPARTMENT_OTHER): Payer: Medicare Other | Attending: Plastic Surgery

## 2012-03-23 DIAGNOSIS — E1169 Type 2 diabetes mellitus with other specified complication: Secondary | ICD-10-CM | POA: Insufficient documentation

## 2012-03-23 DIAGNOSIS — L97509 Non-pressure chronic ulcer of other part of unspecified foot with unspecified severity: Secondary | ICD-10-CM | POA: Insufficient documentation

## 2012-03-24 LAB — GLUCOSE, CAPILLARY
Glucose-Capillary: 323 mg/dL — ABNORMAL HIGH (ref 70–99)
Glucose-Capillary: 261 mg/dL — ABNORMAL HIGH (ref 70–99)

## 2012-03-27 LAB — GLUCOSE, CAPILLARY: Glucose-Capillary: 204 mg/dL — ABNORMAL HIGH (ref 70–99)

## 2012-03-29 ENCOUNTER — Encounter (HOSPITAL_COMMUNITY): Payer: Self-pay | Admitting: *Deleted

## 2012-03-29 ENCOUNTER — Emergency Department (HOSPITAL_COMMUNITY): Payer: Medicare Other

## 2012-03-29 ENCOUNTER — Inpatient Hospital Stay (HOSPITAL_COMMUNITY)
Admission: EM | Admit: 2012-03-29 | Discharge: 2012-04-04 | DRG: 617 | Disposition: A | Discharge status: Home Health | Payer: Medicare Other | Attending: Internal Medicine | Admitting: Internal Medicine

## 2012-03-29 ENCOUNTER — Inpatient Hospital Stay (HOSPITAL_COMMUNITY): Payer: Medicare Other

## 2012-03-29 DIAGNOSIS — L03119 Cellulitis of unspecified part of limb: Secondary | ICD-10-CM | POA: Diagnosis present

## 2012-03-29 DIAGNOSIS — I350 Nonrheumatic aortic (valve) stenosis: Secondary | ICD-10-CM | POA: Diagnosis present

## 2012-03-29 DIAGNOSIS — Z85828 Personal history of other malignant neoplasm of skin: Secondary | ICD-10-CM | POA: Insufficient documentation

## 2012-03-29 DIAGNOSIS — D72829 Elevated white blood cell count, unspecified: Secondary | ICD-10-CM | POA: Diagnosis present

## 2012-03-29 DIAGNOSIS — I6523 Occlusion and stenosis of bilateral carotid arteries: Secondary | ICD-10-CM | POA: Diagnosis present

## 2012-03-29 DIAGNOSIS — L02619 Cutaneous abscess of unspecified foot: Secondary | ICD-10-CM | POA: Diagnosis present

## 2012-03-29 DIAGNOSIS — I509 Heart failure, unspecified: Secondary | ICD-10-CM | POA: Diagnosis present

## 2012-03-29 DIAGNOSIS — M9080 Osteopathy in diseases classified elsewhere, unspecified site: Secondary | ICD-10-CM | POA: Diagnosis present

## 2012-03-29 DIAGNOSIS — E1169 Type 2 diabetes mellitus with other specified complication: Principal | ICD-10-CM | POA: Diagnosis present

## 2012-03-29 DIAGNOSIS — E782 Mixed hyperlipidemia: Secondary | ICD-10-CM | POA: Diagnosis present

## 2012-03-29 DIAGNOSIS — I359 Nonrheumatic aortic valve disorder, unspecified: Secondary | ICD-10-CM | POA: Diagnosis present

## 2012-03-29 DIAGNOSIS — Z8673 Personal history of transient ischemic attack (TIA), and cerebral infarction without residual deficits: Secondary | ICD-10-CM | POA: Diagnosis present

## 2012-03-29 DIAGNOSIS — R509 Fever, unspecified: Secondary | ICD-10-CM | POA: Diagnosis present

## 2012-03-29 DIAGNOSIS — I96 Gangrene, not elsewhere classified: Secondary | ICD-10-CM | POA: Diagnosis present

## 2012-03-29 DIAGNOSIS — E785 Hyperlipidemia, unspecified: Secondary | ICD-10-CM | POA: Diagnosis present

## 2012-03-29 DIAGNOSIS — I10 Essential (primary) hypertension: Secondary | ICD-10-CM | POA: Diagnosis present

## 2012-03-29 DIAGNOSIS — I119 Hypertensive heart disease without heart failure: Secondary | ICD-10-CM | POA: Diagnosis present

## 2012-03-29 DIAGNOSIS — E11621 Type 2 diabetes mellitus with foot ulcer: Secondary | ICD-10-CM

## 2012-03-29 DIAGNOSIS — L97509 Non-pressure chronic ulcer of other part of unspecified foot with unspecified severity: Secondary | ICD-10-CM | POA: Diagnosis present

## 2012-03-29 DIAGNOSIS — E1159 Type 2 diabetes mellitus with other circulatory complications: Secondary | ICD-10-CM | POA: Diagnosis present

## 2012-03-29 DIAGNOSIS — I2510 Atherosclerotic heart disease of native coronary artery without angina pectoris: Secondary | ICD-10-CM | POA: Diagnosis present

## 2012-03-29 DIAGNOSIS — L988 Other specified disorders of the skin and subcutaneous tissue: Secondary | ICD-10-CM

## 2012-03-29 DIAGNOSIS — E1129 Type 2 diabetes mellitus with other diabetic kidney complication: Secondary | ICD-10-CM | POA: Diagnosis present

## 2012-03-29 DIAGNOSIS — Z951 Presence of aortocoronary bypass graft: Secondary | ICD-10-CM | POA: Diagnosis present

## 2012-03-29 DIAGNOSIS — M869 Osteomyelitis, unspecified: Secondary | ICD-10-CM | POA: Diagnosis present

## 2012-03-29 DIAGNOSIS — E871 Hypo-osmolality and hyponatremia: Secondary | ICD-10-CM | POA: Diagnosis present

## 2012-03-29 DIAGNOSIS — L03116 Cellulitis of left lower limb: Secondary | ICD-10-CM

## 2012-03-29 HISTORY — DX: Essential (primary) hypertension: I10

## 2012-03-29 HISTORY — DX: Pure hypercholesterolemia, unspecified: E78.00

## 2012-03-29 HISTORY — DX: Presence of aortocoronary bypass graft: Z95.1

## 2012-03-29 HISTORY — DX: Personal history of other malignant neoplasm of skin: Z85.828

## 2012-03-29 HISTORY — DX: Nonrheumatic aortic (valve) stenosis: I35.0

## 2012-03-29 LAB — DIFFERENTIAL
Basophils Relative: 0 % (ref 0–1)
Lymphocytes Relative: 13 % (ref 12–46)
Lymphs Abs: 2.1 10*3/uL (ref 0.7–4.0)
Monocytes Relative: 10 % (ref 3–12)
Neutro Abs: 12.8 10*3/uL — ABNORMAL HIGH (ref 1.7–7.7)
Neutrophils Relative %: 77 % (ref 43–77)

## 2012-03-29 LAB — URINALYSIS, ROUTINE W REFLEX MICROSCOPIC
Glucose, UA: >1000 mg/dL — AB
Hgb urine dipstick: NEGATIVE
Protein, ur: 30 mg/dL — AB
Specific Gravity, Urine: 1.033 — ABNORMAL HIGH (ref 1.005–1.030)

## 2012-03-29 LAB — CREATININE, URINE, RANDOM: Creatinine, Urine: 89.37 mg/dL

## 2012-03-29 LAB — CBC
Hemoglobin: 12.1 g/dL — ABNORMAL LOW (ref 13.0–17.0)
Hemoglobin: 12.5 g/dL — ABNORMAL LOW (ref 13.0–17.0)
MCH: 28.7 pg (ref 26.0–34.0)
MCHC: 33.5 g/dL (ref 30.0–36.0)
MCHC: 33.2 g/dL (ref 30.0–36.0)
RBC: 4.17 MIL/uL — ABNORMAL LOW (ref 4.22–5.81)
WBC: 16.6 10*3/uL — ABNORMAL HIGH (ref 4.0–10.5)

## 2012-03-29 LAB — URINE MICROSCOPIC-ADD ON

## 2012-03-29 LAB — APTT: aPTT: 45 seconds — ABNORMAL HIGH (ref 24–37)

## 2012-03-29 LAB — BASIC METABOLIC PANEL
BUN: 14 mg/dL (ref 6–23)
CO2: 22 mEq/L (ref 19–32)
Chloride: 97 mEq/L (ref 96–112)
GFR calc Af Amer: >90 mL/min (ref >90)
Potassium: 4.3 mEq/L (ref 3.5–5.1)

## 2012-03-29 LAB — HEPATIC FUNCTION PANEL
Albumin: 3.2 g/dL — ABNORMAL LOW (ref 3.5–5.2)
Alkaline Phosphatase: 68 U/L (ref 39–117)
Total Bilirubin: 0.4 mg/dL (ref 0.3–1.2)

## 2012-03-29 LAB — POCT I-STAT TROPONIN I: Troponin i, poc: 0.01 ng/mL (ref 0.00–0.08)

## 2012-03-29 LAB — TSH: TSH: 1.176 u[IU]/mL (ref 0.350–4.500)

## 2012-03-29 LAB — MAGNESIUM: Magnesium: 2.1 mg/dL (ref 1.5–2.5)

## 2012-03-29 LAB — CREATININE, SERUM: Creatinine, Ser: 0.96 mg/dL (ref 0.50–1.35)

## 2012-03-29 MED ORDER — OXYCODONE HCL 5 MG PO TABS
5.0000 mg | ORAL_TABLET | ORAL | Status: DC | PRN
  Administered 2012-03-29 – 2012-04-04 (×17): 5 mg via ORAL
  Filled 2012-03-29 (×18): qty 1

## 2012-03-29 MED ORDER — COLESEVELAM HCL 625 MG PO TABS
1875.0000 mg | ORAL_TABLET | Freq: Two times a day (BID) | ORAL | Status: DC
  Administered 2012-03-29 – 2012-04-04 (×12): 1875 mg via ORAL
  Filled 2012-03-29 (×15): qty 3

## 2012-03-29 MED ORDER — MORPHINE SULFATE 2 MG/ML IJ SOLN
1.0000 mg | INTRAMUSCULAR | Status: DC | PRN
  Administered 2012-03-30: 2 mg via INTRAVENOUS
  Administered 2012-03-30 (×2): 1 mg via INTRAVENOUS
  Administered 2012-03-31 (×3): 2 mg via INTRAVENOUS
  Filled 2012-03-29 (×6): qty 1

## 2012-03-29 MED ORDER — VITAMIN C 500 MG PO TABS
500.0000 mg | ORAL_TABLET | Freq: Four times a day (QID) | ORAL | Status: DC
  Administered 2012-03-29 – 2012-04-04 (×24): 500 mg via ORAL
  Filled 2012-03-29 (×30): qty 1

## 2012-03-29 MED ORDER — VANCOMYCIN HCL 1000 MG IV SOLR
1250.0000 mg | Freq: Two times a day (BID) | INTRAVENOUS | Status: DC
  Administered 2012-03-29 – 2012-04-03 (×10): 1250 mg via INTRAVENOUS
  Filled 2012-03-29 (×11): qty 1250

## 2012-03-29 MED ORDER — ONDANSETRON HCL 4 MG/2ML IJ SOLN: 4.0000 mg | Freq: Four times a day (QID) | INTRAMUSCULAR | Status: DC | PRN

## 2012-03-29 MED ORDER — ALUM & MAG HYDROXIDE-SIMETH 200-200-20 MG/5ML PO SUSP: 30.0000 mL | Freq: Four times a day (QID) | ORAL | Status: DC | PRN

## 2012-03-29 MED ORDER — GADOBENATE DIMEGLUMINE 529 MG/ML IV SOLN
20.0000 mL | Freq: Once | INTRAVENOUS | Status: AC | PRN
  Administered 2012-03-29: 20 mL via INTRAVENOUS

## 2012-03-29 MED ORDER — CIPROFLOXACIN IN D5W 400 MG/200ML IV SOLN
400.0000 mg | Freq: Two times a day (BID) | INTRAVENOUS | Status: DC
  Filled 2012-03-29: qty 200

## 2012-03-29 MED ORDER — MAG OXIDE-VIT D3-TURMERIC 500-3000-150 MG-UNIT-MG PO CAPS: 2.0000 | ORAL_CAPSULE | Freq: Two times a day (BID) | ORAL | Status: DC

## 2012-03-29 MED ORDER — VITAMIN B-12 500 MCG PO TABS: 500.0000 ug | ORAL_TABLET | Freq: Two times a day (BID) | ORAL | Status: DC

## 2012-03-29 MED ORDER — POTASSIUM CHLORIDE CRYS ER 20 MEQ PO TBCR
20.0000 meq | EXTENDED_RELEASE_TABLET | Freq: Two times a day (BID) | ORAL | Status: DC
  Administered 2012-03-29 – 2012-04-04 (×11): 20 meq via ORAL
  Filled 2012-03-29 (×17): qty 1

## 2012-03-29 MED ORDER — PIPERACILLIN-TAZOBACTAM 3.375 G IVPB
3.3750 g | Freq: Once | INTRAVENOUS | Status: AC
  Administered 2012-03-29: 3.375 g via INTRAVENOUS
  Filled 2012-03-29 (×2): qty 50

## 2012-03-29 MED ORDER — PANTOPRAZOLE SODIUM 40 MG PO TBEC
40.0000 mg | DELAYED_RELEASE_TABLET | Freq: Every day | ORAL | Status: DC
  Administered 2012-03-30 – 2012-04-04 (×6): 40 mg via ORAL
  Filled 2012-03-29 (×8): qty 1

## 2012-03-29 MED ORDER — BISACODYL 5 MG PO TBEC
10.0000 mg | DELAYED_RELEASE_TABLET | Freq: Every day | ORAL | Status: DC | PRN
  Filled 2012-03-29: qty 2

## 2012-03-29 MED ORDER — CHLORHEXIDINE GLUCONATE 4 % EX LIQD
60.0000 mL | Freq: Once | CUTANEOUS | Status: AC
  Administered 2012-03-29: 4 via TOPICAL
  Filled 2012-03-29: qty 60

## 2012-03-29 MED ORDER — ACETAMINOPHEN 650 MG RE SUPP: 650.0000 mg | Freq: Four times a day (QID) | RECTAL | Status: DC | PRN

## 2012-03-29 MED ORDER — ACETAMINOPHEN 325 MG PO TABS
650.0000 mg | ORAL_TABLET | Freq: Four times a day (QID) | ORAL | Status: DC | PRN
  Administered 2012-03-31 – 2012-04-03 (×4): 650 mg via ORAL
  Filled 2012-03-29 (×4): qty 2

## 2012-03-29 MED ORDER — ENOXAPARIN SODIUM 40 MG/0.4ML ~~LOC~~ SOLN
40.0000 mg | SUBCUTANEOUS | Status: DC
  Administered 2012-03-29: 40 mg via SUBCUTANEOUS
  Filled 2012-03-29 (×2): qty 0.4

## 2012-03-29 MED ORDER — SODIUM CHLORIDE 0.9 % IV SOLN
INTRAVENOUS | Status: AC
  Administered 2012-03-29: 500 mL via INTRAVENOUS
  Administered 2012-03-30 – 2012-03-31 (×2): via INTRAVENOUS

## 2012-03-29 MED ORDER — VANCOMYCIN HCL IN DEXTROSE 1-5 GM/200ML-% IV SOLN
1000.0000 mg | Freq: Once | INTRAVENOUS | Status: AC
  Administered 2012-03-29: 1000 mg via INTRAVENOUS
  Filled 2012-03-29: qty 200

## 2012-03-29 MED ORDER — INSULIN ASPART 100 UNIT/ML ~~LOC~~ SOLN
0.0000 [IU] | Freq: Three times a day (TID) | SUBCUTANEOUS | Status: DC
  Administered 2012-03-29 – 2012-03-30 (×2): 5 [IU] via SUBCUTANEOUS
  Administered 2012-03-30: 3 [IU] via SUBCUTANEOUS
  Administered 2012-03-30: 2 [IU] via SUBCUTANEOUS
  Administered 2012-03-31: 8 [IU] via SUBCUTANEOUS
  Administered 2012-03-31 – 2012-04-01 (×4): 5 [IU] via SUBCUTANEOUS
  Administered 2012-04-01 – 2012-04-02 (×2): 8 [IU] via SUBCUTANEOUS
  Administered 2012-04-02: 15 [IU] via SUBCUTANEOUS
  Administered 2012-04-02: 8 [IU] via SUBCUTANEOUS
  Administered 2012-04-03: 2 [IU] via SUBCUTANEOUS
  Administered 2012-04-03 (×2): 8 [IU] via SUBCUTANEOUS
  Administered 2012-04-04: 11 [IU] via SUBCUTANEOUS
  Administered 2012-04-04: 8 [IU] via SUBCUTANEOUS
  Administered 2012-04-04: 5 [IU] via SUBCUTANEOUS

## 2012-03-29 MED ORDER — CYANOCOBALAMIN 500 MCG PO TABS
500.0000 ug | ORAL_TABLET | Freq: Two times a day (BID) | ORAL | Status: DC
  Administered 2012-03-29 – 2012-04-04 (×12): 500 ug via ORAL
  Filled 2012-03-29 (×15): qty 1

## 2012-03-29 MED ORDER — ONDANSETRON HCL 4 MG PO TABS: 4.0000 mg | ORAL_TABLET | Freq: Four times a day (QID) | ORAL | Status: DC | PRN

## 2012-03-29 MED ORDER — PIPERACILLIN-TAZOBACTAM 3.375 G IVPB
3.3750 g | Freq: Three times a day (TID) | INTRAVENOUS | Status: DC
  Administered 2012-03-29 – 2012-04-04 (×16): 3.375 g via INTRAVENOUS
  Filled 2012-03-29 (×21): qty 50

## 2012-03-29 MED ORDER — FLEET ENEMA 7-19 GM/118ML RE ENEM: 1.0000 | ENEMA | Freq: Once | RECTAL | Status: AC | PRN

## 2012-03-29 MED ORDER — DOCUSATE SODIUM 100 MG PO CAPS
100.0000 mg | ORAL_CAPSULE | Freq: Two times a day (BID) | ORAL | Status: DC
  Administered 2012-03-29 – 2012-04-04 (×12): 100 mg via ORAL
  Filled 2012-03-29 (×15): qty 1

## 2012-03-29 MED ORDER — ASPIRIN 325 MG PO TABS
325.0000 mg | ORAL_TABLET | Freq: Every day | ORAL | Status: DC
  Administered 2012-03-31 – 2012-04-04 (×5): 325 mg via ORAL
  Filled 2012-03-29 (×7): qty 1

## 2012-03-29 MED ORDER — POLYETHYLENE GLYCOL 3350 17 G PO PACK
17.0000 g | PACK | Freq: Every day | ORAL | Status: DC | PRN
  Filled 2012-03-29: qty 1

## 2012-03-29 MED ORDER — FUROSEMIDE 40 MG PO TABS
40.0000 mg | ORAL_TABLET | Freq: Two times a day (BID) | ORAL | Status: DC
  Administered 2012-03-29 – 2012-04-04 (×10): 40 mg via ORAL
  Filled 2012-03-29 (×15): qty 1

## 2012-03-29 MED ORDER — ATENOLOL 50 MG PO TABS
50.0000 mg | ORAL_TABLET | Freq: Every day | ORAL | Status: DC
  Administered 2012-03-30 – 2012-04-04 (×6): 50 mg via ORAL
  Filled 2012-03-29 (×7): qty 1
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
## 2012-03-29 NOTE — ED Provider Notes (Signed)
History     CSN: [id number]  Arrival date & time 03/29/12  7829   First MD Initiated Contact with Patient 03/29/12 1005      Chief Complaint  Patient presents with  . Foot Pain    Pts 2nd toe on left foot is black and necrotic he has been going to wound care clinic >8 weeks    (Consider location/radiation/quality/duration/timing/severity/associated sxs/prior treatment) HPI Pt with known L foot 2nd digit diabetic ulcer being treated at wound care clinic sent into ED for worsening wound. Pt saw Dr Cecilie Kicks and is scheduled for MRI today. States he noticed today that the entire toe has turned black and now has redness and swelling in th L foot. +chills at home. No fever, N/V, abd pain.  Past Medical History  Diagnosis Date  . Diabetes mellitus   . Hypertension   . Hypercholesteremia   . Coronary artery disease     Past Surgical History  Procedure Date  . Back surgery   . Cholecystectomy   . Carotidendarterectomy   . Coronary artery bypass graft     5    History reviewed. No pertinent family history.  History  Substance Use Topics  . Smoking status: Former Smoker    Types: Cigarettes    Quit date: 03/30/1959  . Smokeless tobacco: Never Used  . Alcohol Use: No      Review of Systems  Constitutional: Positive for chills. Negative for fever.  Respiratory: Negative for shortness of breath.   Cardiovascular: Positive for leg swelling. Negative for chest pain.  Gastrointestinal: Negative for nausea, vomiting and abdominal pain.  Skin: Positive for color change and wound.  Neurological: Negative for dizziness, weakness, numbness and headaches.    Allergies  Review of patient's allergies indicates no known allergies.  Home Medications   Current Outpatient Rx  Name Route Sig Dispense Refill  . ASPIRIN 325 MG PO TABS Oral Take 325 mg by mouth daily.    . ATENOLOL 50 MG PO TABS Oral Take 50 mg by mouth daily.    . COLESEVELAM HCL 625 MG PO TABS Oral Take 1,875 mg by  mouth 2 (two) times daily.    . FUROSEMIDE 40 MG PO TABS Oral Take 40 mg by mouth 2 (two) times daily.    [name] MAG OXIDE-VIT D3-TURMERIC PO Oral Take 2 capsules by mouth 2 (two) times daily.    [name] METFORMIN HCL ER 500 MG PO TB24 Oral Take 1,000 mg by mouth 2 (two) times daily.    [name] POTASSIUM CHLORIDE CRYS ER 20 MEQ PO TBCR Oral Take 20 mEq by mouth 2 (two) times daily.    [name] VITAMIN B-12 500 MCG PO TABS Oral Take 500 mcg by mouth 2 (two) times daily.    [name] VITAMIN C 500 MG PO TABS Oral Take 500 mg by mouth 4 (four) times daily.      BP 133/79  Pulse 84  Temp(Src) 99.3 F (37.4 C) (Oral)  Resp 22  Wt 213 lb (96.616 kg)  SpO2 98%  Physical Exam  Nursing note and vitals reviewed. Constitutional: He is oriented to person, place, and time. He appears well-developed and well-nourished. No distress.  HENT:  Head: Normocephalic and atraumatic.  Mouth/Throat: Oropharynx is clear and moist.  Eyes: EOM are normal. Pupils are equal, round, and reactive to light.  Neck: Normal range of motion. Neck supple.  Cardiovascular: Normal rate and regular rhythm.   Pulmonary/Chest: Effort normal and breath sounds normal. No respiratory distress. He  has no wheezes. He has no rales.  Abdominal: Soft. Bowel sounds are normal. There is no tenderness. There is no rebound and no guarding.  Musculoskeletal: Normal range of motion. He exhibits edema. He exhibits no tenderness.       2nd Toe of L foot is necrotic to its base with cellulitis extending up dorsum of foot approaching ankle. 2+ pitting edema of foot. Unable to palpate DP or PT pulses. Delayed cap refill. No streaking present   Neurological: He is alert and oriented to person, place, and time.  Skin: Skin is warm and dry. No rash noted. No erythema.  Psychiatric: He has a normal mood and affect. His behavior is normal.    ED Course  Procedures (including critical care time)  Labs Reviewed  CBC - Abnormal; Notable for the following:    WBC 16.6 (*)     RBC 4.17 (*)    Hemoglobin 12.1 (*)    HCT 36.1 (*)    All other components within normal limits  DIFFERENTIAL - Abnormal; Notable for the following:    Neutro Abs 12.8 (*)    Monocytes Absolute 1.7 (*)    All other components within normal limits  BASIC METABOLIC PANEL - Abnormal; Notable for the following:    Sodium 129 (*)    Glucose, Bld 378 (*)    GFR calc non Af Amer 84 (*)    All other components within normal limits  PROTIME-INR - Abnormal; Notable for the following:    Prothrombin Time 15.6 (*)    All other components within normal limits  APTT - Abnormal; Notable for the following:    aPTT 45 (*)    All other components within normal limits  POCT I-STAT TROPONIN I  CULTURE, BLOOD (ROUTINE X 2)  CULTURE, BLOOD (ROUTINE X 2)   Dg Chest Port 1 View  03/29/2012  *RADIOLOGY REPORT*  Clinical Data: Preop toe indication.  Hypertension, diabetes.  PORTABLE CHEST - 1 VIEW  Comparison: 03/08/2012  Findings: Prior CABG.  Heart is borderline in size.  Chronic blunting of the left costophrenic angle, likely scarring/pleural thickening.  Mild peribronchial thickening.  No acute opacities or effusions.  IMPRESSION: Scarring/pleural thickening at the left lung base.  Borderline heart size.  Mild bronchitic changes.  Original Report Authenticated By: Cyndie Chime, M.D.   Dg Foot Complete Left  03/29/2012  *RADIOLOGY REPORT*  Clinical Data: Preop left second toe amputation.  Gangrene. Diabetic.  LEFT FOOT - COMPLETE 3+ VIEW  Comparison: None.  Findings: Bone destruction noted at the tip of the left second toe involving much of the distal phalanx.  Soft tissue defect also noted.  Soft tissue gas within the second toe.  No definite osteomyelitis changes within the middle or proximal phalanges.  Degenerative changes in the first MTP joint.  IMPRESSION: Changes of osteomyelitis and bone destruction in the distal phalanx of the left second toe.  Soft tissue defect and soft tissue gas also noted.   Original Report Authenticated By: Cyndie Chime, M.D.     1. Necrosis of toe   2. Cellulitis of foot, left      Date: 03/29/2012  Rate: 87  Rhythm: normal sinus rhythm  QRS Axis: normal  Intervals: normal  ST/T Wave abnormalities: nonspecific T wave changes  Conduction Disutrbances:none  Narrative Interpretation:   Old EKG Reviewed: changes noted    MDM    Dr Victorino Dike to see in ED and [facility] to admit  Loren Racer, MD 03/29/12 [phone]
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
## 2012-03-29 NOTE — ED Notes (Signed)
Pt states "I've been going to the Wound Center, they tell me to go here and go there, but somebody needs to do something, it's getting worse, just left the Wound Center"
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
## 2012-03-30 ENCOUNTER — Encounter (HOSPITAL_COMMUNITY)
Admission: EM | Disposition: A | Discharge status: Home Health | Payer: Self-pay | Source: Home / Self Care | Attending: Internal Medicine

## 2012-03-30 ENCOUNTER — Inpatient Hospital Stay (HOSPITAL_COMMUNITY): Payer: Medicare Other | Admitting: Certified Registered Nurse Anesthetist

## 2012-03-30 ENCOUNTER — Encounter (HOSPITAL_COMMUNITY): Payer: Self-pay | Admitting: Certified Registered Nurse Anesthetist

## 2012-03-30 HISTORY — PX: AMPUTATION: SHX166

## 2012-03-30 LAB — COMPREHENSIVE METABOLIC PANEL
AST: 15 U/L (ref 0–37)
Albumin: 2.7 g/dL — ABNORMAL LOW (ref 3.5–5.2)
Chloride: 97 mEq/L (ref 96–112)
Creatinine, Ser: 1.03 mg/dL (ref 0.50–1.35)
Potassium: 3.8 mEq/L (ref 3.5–5.1)
Sodium: 130 mEq/L — ABNORMAL LOW (ref 135–145)
Total Bilirubin: 0.4 mg/dL (ref 0.3–1.2)

## 2012-03-30 LAB — CBC
MCH: 28.9 pg (ref 26.0–34.0)
MCV: 86.2 fL (ref 78.0–100.0)
Platelets: 237 10*3/uL (ref 150–400)
RBC: 3.98 MIL/uL — ABNORMAL LOW (ref 4.22–5.81)

## 2012-03-30 LAB — GLUCOSE, CAPILLARY: Glucose-Capillary: 143 mg/dL — ABNORMAL HIGH (ref 70–99)

## 2012-03-30 LAB — URINE CULTURE
Colony Count: NO GROWTH
Culture: NO GROWTH

## 2012-03-30 LAB — DIFFERENTIAL
Basophils Relative: 0 % (ref 0–1)
Eosinophils Relative: 1 % (ref 0–5)
Lymphs Abs: 2.5 10*3/uL (ref 0.7–4.0)
Monocytes Absolute: 1.4 10*3/uL — ABNORMAL HIGH (ref 0.1–1.0)

## 2012-03-30 SURGERY — AMPUTATION DIGIT
Anesthesia: General | Site: Toe | Laterality: Left | Wound class: Dirty or Infected

## 2012-03-30 MED ORDER — FENTANYL CITRATE 0.05 MG/ML IJ SOLN
INTRAMUSCULAR | Status: DC | PRN
  Administered 2012-03-30 (×4): 25 ug via INTRAVENOUS

## 2012-03-30 MED ORDER — METOCLOPRAMIDE HCL 10 MG PO TABS: 5.0000 mg | ORAL_TABLET | Freq: Three times a day (TID) | ORAL | Status: DC | PRN

## 2012-03-30 MED ORDER — ONDANSETRON HCL 4 MG PO TABS: 4.0000 mg | ORAL_TABLET | Freq: Four times a day (QID) | ORAL | Status: DC | PRN

## 2012-03-30 MED ORDER — MIDAZOLAM HCL 5 MG/5ML IJ SOLN
INTRAMUSCULAR | Status: DC | PRN
  Administered 2012-03-30: 2 mg via INTRAVENOUS

## 2012-03-30 MED ORDER — PROMETHAZINE HCL 25 MG/ML IJ SOLN: 6.2500 mg | INTRAMUSCULAR | Status: DC | PRN

## 2012-03-30 MED ORDER — FENTANYL CITRATE 0.05 MG/ML IJ SOLN
25.0000 ug | INTRAMUSCULAR | Status: DC | PRN
  Administered 2012-03-30: 25 ug via INTRAVENOUS
  Administered 2012-03-30: 50 ug via INTRAVENOUS
  Administered 2012-03-30: 25 ug via INTRAVENOUS

## 2012-03-30 MED ORDER — 0.9 % SODIUM CHLORIDE (POUR BTL) OPTIME
TOPICAL | Status: DC | PRN
  Administered 2012-03-30: 1000 mL

## 2012-03-30 MED ORDER — LIDOCAINE HCL (CARDIAC) 20 MG/ML IV SOLN
INTRAVENOUS | Status: DC | PRN
  Administered 2012-03-30: 100 mg via INTRAVENOUS

## 2012-03-30 MED ORDER — LACTATED RINGERS IV SOLN: INTRAVENOUS | Status: DC

## 2012-03-30 MED ORDER — METOCLOPRAMIDE HCL 5 MG/ML IJ SOLN: 5.0000 mg | Freq: Three times a day (TID) | INTRAMUSCULAR | Status: DC | PRN

## 2012-03-30 MED ORDER — BACITRACIN ZINC 500 UNIT/GM EX OINT
TOPICAL_OINTMENT | CUTANEOUS | Status: DC | PRN
  Administered 2012-03-30: 1 via TOPICAL

## 2012-03-30 MED ORDER — ONDANSETRON HCL 4 MG/2ML IJ SOLN
INTRAMUSCULAR | Status: DC | PRN
  Administered 2012-03-30: 4 mg via INTRAVENOUS

## 2012-03-30 MED ORDER — LACTATED RINGERS IV SOLN
INTRAVENOUS | Status: DC | PRN
  Administered 2012-03-30: 14:00:00 via INTRAVENOUS

## 2012-03-30 MED ORDER — ONDANSETRON HCL 4 MG/2ML IJ SOLN: 4.0000 mg | Freq: Four times a day (QID) | INTRAMUSCULAR | Status: DC | PRN

## 2012-03-30 MED ORDER — PROPOFOL 10 MG/ML IV EMUL
INTRAVENOUS | Status: DC | PRN
  Administered 2012-03-30: 200 mg via INTRAVENOUS

## 2012-03-30 MED ORDER — PANTOPRAZOLE SODIUM 40 MG IV SOLR
40.0000 mg | Freq: Once | INTRAVENOUS | Status: AC
  Administered 2012-03-30: 40 mg via INTRAVENOUS
  Filled 2012-03-30: qty 40

## 2012-03-30 MED ORDER — EPHEDRINE SULFATE 50 MG/ML IJ SOLN
INTRAMUSCULAR | Status: DC | PRN
  Administered 2012-03-30 (×2): 5 mg via INTRAVENOUS

## 2012-03-30 SURGICAL SUPPLY — 29 items
BANDAGE CONFORM 3  STR LF (GAUZE/BANDAGES/DRESSINGS) ×1 IMPLANT
BANDAGE ELASTIC 4 VELCRO ST LF (GAUZE/BANDAGES/DRESSINGS) ×1 IMPLANT
BNDG CMPR 9X4 STRL LF SNTH (GAUZE/BANDAGES/DRESSINGS) ×1
BNDG COHESIVE 6X5 TAN STRL LF (GAUZE/BANDAGES/DRESSINGS) ×1 IMPLANT
BNDG ESMARK 4X9 LF (GAUZE/BANDAGES/DRESSINGS) ×2 IMPLANT
CHLORAPREP W/TINT 10.5 ML (MISCELLANEOUS) ×2 IMPLANT
CHLORAPREP W/TINT 26ML (MISCELLANEOUS) ×1 IMPLANT
CLOTH BEACON ORANGE TIMEOUT ST (SAFETY) ×2 IMPLANT
CUFF TOURN SGL QUICK 34 (TOURNIQUET CUFF)
CUFF TRNQT CYL 34X4X40X1 (TOURNIQUET CUFF) ×1 IMPLANT
DRAPE U-SHAPE 47X51 STRL (DRAPES) ×2 IMPLANT
DRSG ADAPTIC 3X8 NADH LF (GAUZE/BANDAGES/DRESSINGS) ×1 IMPLANT
ELECT REM PT RETURN 9FT ADLT (ELECTROSURGICAL) ×2
ELECTRODE REM PT RTRN 9FT ADLT (ELECTROSURGICAL) ×1 IMPLANT
GLOVE BIO SURGEON STRL SZ8 (GLOVE) ×4 IMPLANT
GLOVE BIOGEL PI IND STRL 8 (GLOVE) ×1 IMPLANT
GLOVE BIOGEL PI INDICATOR 8 (GLOVE) ×1
GOWN PREVENTION PLUS XLARGE (GOWN DISPOSABLE) ×2 IMPLANT
GOWN STRL NON-REIN LRG LVL3 (GOWN DISPOSABLE) ×2 IMPLANT
KIT BASIN OR (CUSTOM PROCEDURE TRAY) ×2 IMPLANT
MANIFOLD NEPTUNE II (INSTRUMENTS) ×1 IMPLANT
PACK LOWER EXTREMITY WL (CUSTOM PROCEDURE TRAY) ×2 IMPLANT
PAD CAST 4YDX4 CTTN HI CHSV (CAST SUPPLIES) ×1 IMPLANT
PADDING CAST COTTON 4X4 STRL (CAST SUPPLIES) ×2
SPONGE GAUZE 4X4 12PLY (GAUZE/BANDAGES/DRESSINGS) ×2 IMPLANT
SUCTION FRAZIER TIP 10 FR DISP (SUCTIONS) ×2 IMPLANT
SUT ETHILON 2 0 PS N (SUTURE) ×2 IMPLANT
SUT ETHILON 3 0 PS 1 (SUTURE) ×1 IMPLANT
TOWEL OR 17X26 10 PK STRL BLUE (TOWEL DISPOSABLE) ×2 IMPLANT

## 2012-03-30 NOTE — Preoperative (Signed)
Beta Blockers   Reason not to administer Beta Blockers:Not Applicable Pt took Atenolol 03-30-12

## 2012-03-30 NOTE — Progress Notes (Signed)
Subjective: Pt denies f/c/n/v.  No pain.  NO c/o otherwise.   Objective: Vital signs in last 24 hours: Temp:  [98.2 F (36.8 C)-99.5 F (37.5 C)] 99.5 F (37.5 C) 04-08-23 0400) Pulse Rate:  [81-87] 85  2023/04/08 0400) Resp:  [18-20] 18  04/08/2023 0400) BP: (126-146)/(53-68) 126/53 mmHg 2023/04/08 0400) SpO2:  [96 %-98 %] 96 % 04-08-2023 0400) Weight:  [96.616 kg (213 lb)] 96.616 kg (213 lb) (04/10 1629)  Intake/Output from previous day: 04/10 0701 - 04-08-23 0700 In: 360 [P.O.:360] Out: 1200 [Urine:1200] Intake/Output this shift: Total I/O In: -  Out: 500 [Urine:500]   Basename 2012/04/07 0400 03/29/12 1450 03/29/12 1030  HGB 11.5* 12.5* 12.1*    Basename 2012/04/07 0400 03/29/12 1450  WBC 13.8* 16.2*  RBC 3.98* 4.35  HCT 34.3* 37.6*  PLT 237 259    Basename 2012-04-07 0400 03/29/12 1450 03/29/12 1030  NA 130* -- 129*  K 3.8 -- 4.3  CL 97 -- 97  CO2 24 -- 22  BUN 16 -- 14  CREATININE 1.03 0.96 --  GLUCOSE 249* -- 378*  CALCIUM 8.6 -- 8.7    Basename 03/29/12 1030  LABPT --  INR 1.21   MRI shows osteo of distal phalanx only.  right 2nd toe necrotic to middle phalanx.  cellulitis improved from yesterday.  skin otherwise intact.  pulses palpable.  no lymphangiitis.  Assessment/Plan: R 2nd toe gangrene - to OR for 2nd toe amputation.  The risks and benefits of the alternative treatment options have been discussed in detail.  The patient wishes to proceed with surgery and specifically understands risks of bleeding, infection, nerve damage, blood clots, need for additional surgery, amputation and death.    Anthony Holder 04/07/2012, 12:57 PM

## 2012-03-30 NOTE — Progress Notes (Signed)
UR completed 

## 2012-03-30 NOTE — Brief Op Note (Signed)
03/29/2012 - 03/30/2012  2:29 PM  PATIENT:  Anthony Holder  71 y.o. male  PRE-OPERATIVE DIAGNOSIS:  left second toe gangrene  POST-OPERATIVE DIAGNOSIS:  left second toe gangrene  Procedure(s): Left 2nd toe amputation through MTP joint  SURGEON:  Toni Arthurs, MD  ASSISTANT: n/a  ANESTHESIA:   General  EBL:  minimal   TOURNIQUET:  14 min with ankle esmarch  COMPLICATIONS:  None apparent  DISPOSITION:  Extubated, awake and stable to recovery.  Specimens:  Deep tissue to micro for aerobic and anaerobic culture, toe to path.  DICTATION ID:  [id number]

## 2012-03-30 NOTE — Progress Notes (Signed)
Subjective: Patient c/o pain in foot. States some decrease in swelling.  Objective: Vital signs in last 24 hours: Filed Vitals:   03/29/12 1230 03/29/12 1629 03/29/12 2220 03/30/12 0400  BP: 133/79 146/68 132/62 126/53  Pulse: 84 87 81 85  Temp: 99.3 F (37.4 C) 98.4 F (36.9 C) 98.2 F (36.8 C) 99.5 F (37.5 C)  TempSrc: Oral  Oral Oral  Resp: 22 20 18 18   Height:  5\' 10"  (1.778 m)    Weight:  96.616 kg (213 lb)    SpO2: 98% 96% 98% 96%    Intake/Output Summary (Last 24 hours) at 03/30/12 1318 Last data filed at 03/30/12 0950  Gross per 24 hour  Intake    360 ml  Output   1700 ml  Net  -1340 ml    Weight change:   General: Alert, awake, oriented x3, in no acute distress. HEENT: No bruits, no goiter. Heart: Regular rate and rhythm, without murmurs, rubs, gallops. Lungs: Clear to auscultation bilaterally. Abdomen: Soft, nontender, nondistended, positive bowel sounds. Extremities: No clubbing cyanosis . Gangrenous left 2nd toe. Erythema of foot, 1-2 + LLE edema, warmth edema with positive pedal pulses. Neuro: Grossly intact, nonfocal.    Lab Results:  Basename 03/30/12 0400 03/29/12 1450 03/29/12 1030  NA 130* -- 129*  K 3.8 -- 4.3  CL 97 -- 97  CO2 24 -- 22  GLUCOSE 249* -- 378*  BUN 16 -- 14  CREATININE 1.03 0.96 --  CALCIUM 8.6 -- 8.7  MG -- 2.1 --  PHOS -- -- --    Basename 03/30/12 0400 03/29/12 1450  AST 15 12  ALT 9 9  ALKPHOS 68 68  BILITOT 0.4 0.4  PROT 6.7 7.5  ALBUMIN 2.7* 3.2*   No results found for this basename: LIPASE:2,AMYLASE:2 in the last 72 hours  Basename 03/30/12 0400 03/29/12 1450 03/29/12 1030  WBC 13.8* 16.2* --  NEUTROABS 9.8* -- 12.8*  HGB 11.5* 12.5* --  HCT 34.3* 37.6* --  MCV 86.2 86.4 --  PLT 237 259 --   No results found for this basename: CKTOTAL:3,CKMB:3,CKMBINDEX:3,TROPONINI:3 in the last 72 hours No components found with this basename: POCBNP:3 No results found for this basename: DDIMER:2 in the last 72  hours  Basename 03/29/12 1450  HGBA1C 9.6*   No results found for this basename: CHOL:2,HDL:2,LDLCALC:2,TRIG:2,CHOLHDL:2,LDLDIRECT:2 in the last 72 hours  Basename 03/29/12 1450  TSH 1.176  T4TOTAL --  T3FREE --  THYROIDAB --   No results found for this basename: VITAMINB12:2,FOLATE:2,FERRITIN:2,TIBC:2,IRON:2,RETICCTPCT:2 in the last 72 hours  Micro Results: Recent Results (from the past 240 hour(s))  CULTURE, BLOOD (ROUTINE X 2)     Status: Normal (Preliminary result)   Collection Time   03/29/12 10:30 AM      Component Value Range Status Comment   Specimen Description BLOOD LEFT HAND   Final    Special Requests BOTTLES DRAWN AEROBIC ONLY   Final    Culture  Setup Time [id number]   Final    Culture     Final    Value:        BLOOD CULTURE RECEIVED NO GROWTH TO DATE CULTURE WILL BE HELD FOR 5 DAYS BEFORE ISSUING A FINAL NEGATIVE REPORT   Report Status PENDING   Incomplete   CULTURE, BLOOD (ROUTINE X 2)     Status: Normal (Preliminary result)   Collection Time   03/29/12 11:09 AM      Component Value Range Status Comment   Specimen Description  BLOOD RIGHT WRIST   Final    Special Requests BOTTLES DRAWN AEROBIC ONLY   Final    Culture  Setup Time [id number]   Final    Culture     Final    Value:        BLOOD CULTURE RECEIVED NO GROWTH TO DATE CULTURE WILL BE HELD FOR 5 DAYS BEFORE ISSUING A FINAL NEGATIVE REPORT   Report Status PENDING   Incomplete     Studies/Results: [name]  03/30/2012  *RADIOLOGY REPORT*  Clinical Data: Diabetes.  Necrotic second toe.  MRI OF THE LEFT FOREFOOT WITHOUT AND WITH CONTRAST  Technique:  Multiplanar, multisequence MR imaging was performed both before and after administration of intravenous contrast.  Contrast: 20mL MULTIHANCE GADOBENATE DIMEGLUMINE 529 MG/ML IV SOLN  Comparison: 03/29/2012  Findings: There is been resorption of most of the terminal phalanx of the second toe, with associated soft tissue defect.  A  small sliver of residual base of the terminal phalanx is still present.  The remaining phalanges of the second toe and in the remaining toes do not demonstrate significant abnormal internal edema signal to suggest active osteomyelitis.  Similarly the metatarsals and adjacent tarsal bones do not demonstrate MR findings of osteomyelitis.  The Lisfranc ligament appears intact.  Subcutaneous edema and low-level subcutaneous enhancement along the dorsum of the foot are suspicious for cellulitis.  There is also mildly greater than expected enhancement infiltrating the plantar musculature of the foot, potentially from myositis.  IMPRESSION:  1.  Resorption of the terminal phalanx of the second toe, without definite findings of osteomyelitis in the remaining phalanges, remaining toes, or remaining forefoot. 2.  Subcutaneous edema and enhancement suggesting cellulitis dorsally. 3.  Mildly greater than expected enhancement in the plantar musculature of the foot suggesting myositis.  No drainable abscess observed.  Original Report Authenticated By: Dellia Cloud, M.D.   Dg Chest Port 1 View  03/29/2012  *RADIOLOGY REPORT*  Clinical Data: Preop toe indication.  Hypertension, diabetes.  PORTABLE CHEST - 1 VIEW  Comparison: 03/08/2012  Findings: Prior CABG.  Heart is borderline in size.  Chronic blunting of the left costophrenic angle, likely scarring/pleural thickening.  Mild peribronchial thickening.  No acute opacities or effusions.  IMPRESSION: Scarring/pleural thickening at the left lung base.  Borderline heart size.  Mild bronchitic changes.  Original Report Authenticated By: Cyndie Chime, M.D.   Dg Foot Complete Left  03/29/2012  *RADIOLOGY REPORT*  Clinical Data: Preop left second toe amputation.  Gangrene. Diabetic.  LEFT FOOT - COMPLETE 3+ VIEW  Comparison: None.  Findings: Bone destruction noted at the tip of the left second toe involving much of the distal phalanx.  Soft tissue defect also noted.  Soft  tissue gas within the second toe.  No definite osteomyelitis changes within the middle or proximal phalanges.  Degenerative changes in the first MTP joint.  IMPRESSION: Changes of osteomyelitis and bone destruction in the distal phalanx of the left second toe.  Soft tissue defect and soft tissue gas also noted.  Original Report Authenticated By: Cyndie Chime, M.D.    Medications:     . aspirin  325 mg Oral Daily  . atenolol  50 mg Oral Daily  . chlorhexidine  60 mL Topical Once  . colesevelam  1,875 mg Oral BID  . vitamin B-12  500 mcg Oral BID  . docusate sodium  100 mg Oral BID  . furosemide  40 mg Oral BID  .  insulin aspart  0-15 Units Subcutaneous TID WC  . Mag Oxide-Vit D3-Turmeric  2 capsule Oral BID  . pantoprazole  40 mg Oral Q0600  . pantoprazole (PROTONIX) IV  40 mg Intravenous Once  . piperacillin-tazobactam (ZOSYN)  IV  3.375 g Intravenous Once  . piperacillin-tazobactam (ZOSYN)  IV  3.375 g Intravenous Q8H  . potassium chloride SA  20 mEq Oral BID  . vancomycin  1,250 mg Intravenous Q12H  . vitamin C  500 mg Oral QID  . DISCONTD: ciprofloxacin  400 mg Intravenous Q12H  . DISCONTD: enoxaparin  40 mg Subcutaneous Q24H  . DISCONTD: Mag Oxide-Vit D3-Turmeric  2 capsule Oral BID  . DISCONTD: vitamin B-12  500 mcg Oral BID    Assessment: Principal Problem:  *Necrotic toes Active Problems:  DIABETES MELLITUS, TYPE II  DYSLIPIDEMIA  HYPERTENSION  CAD  CHF  CAROTID ARTERY DISEASE  Cellulitis and abscess of foot  Diabetic ulcer of toe  Leukocytosis  Hyponatremia  CVA (cerebral infarction)  Osteomyelitis of ankle or foot  Aortic stenosis   Plan: #1 left second necrotic(gangrene) toe/osteomyelitis/left foot cellulitis /diabetic foot infection/myositis Patient did have a diabetic toe also which has worsened into necrotic toes with osteomyelitis and left foot cellulitis. MRI with myositis, cellulitis and no osteomyelitis in remaining phalanges.  Blood cultures are  pending. Continue IV vancomycin and zosyn. Patient to OR today. Ortho and ID ff and appreciate input and rxcs. PT OT. Diabetes management.  #2 leukocytosis  Likely secondary to problem #1. WBC trending down. Chest x-ray is negative. UA negative. Blood cultures are pending. Continue empiric IV vancomycin and ciprofloxacin and follow.  #3 hyponatremia  Slowly improving. Check a TSH WNL. Chest x-ray is negative. Continue IVF. #4 hypertension  Stable. Continue home regimen.  #5 coronary artery disease/CHF/status post CABG/mild-to-moderate aortic stenosis  Stable. Patient is currently compensated. Continue home regimen of aspirin, atenolol, Lasix. EKG is normal sinus rhythm. Will follow.  #6 history of CVA  Stable. Continue aspirin and risk factor modification. PT.  #7 type 2 diabetes   hemoglobin A1c = 9.6. Will hold oral hypoglycemics. We'll place on sliding scale insulin.  #8 prophylaxis  PPI for GI prophylaxis. Lovenox for DVT prophylaxis.  Kaeleen Odom  03/29/2012, 1:34 PM     LOS: 1 day   Analucia Hush 03/30/2012, 1:18 PM

## 2012-03-30 NOTE — Anesthesia Postprocedure Evaluation (Signed)
Anesthesia Post Note  Patient: Anthony Holder  Procedure(s) Performed: Procedure(s) (LRB): AMPUTATION DIGIT (Left)  Anesthesia type: General  Patient location: PACU  Post pain: Pain level controlled  Post assessment: Post-op Vital signs reviewed  Last Vitals:  Filed Vitals:   03/30/12 1445  BP: 113/55  Pulse: 82  Temp:   Resp: 21    Post vital signs: Reviewed  Level of consciousness: sedated  Complications: No apparent anesthesia complications

## 2012-03-30 NOTE — Progress Notes (Signed)
Inpatient Diabetes Program Recommendations  AACE/ADA: New Consensus Statement on Inpatient Glycemic Control (2009)  Target Ranges:  Prepandial:   less than 140 mg/dL      Peak postprandial:   less than 180 mg/dL (1-2 hours)      Critically ill patients:  140 - 180 mg/dL   Reason for Visit: Hyperglycemia  Results for Anthony Holder, Anthony Holder (MRN [id number]) as of 03/30/2012 12:37  Ref. Range 03/29/2012 14:50  Hemoglobin A1C Latest Range: <5.7 % 9.6 (H)  Results for Anthony Holder, Anthony Holder (MRN [id number]) as of 03/30/2012 12:37  Ref. Range 03/29/2012 10:30 03/30/2012 04:00  Glucose Latest Range: 70-99 mg/dL 629 (H) 528 (H)  Results for Anthony Holder, Anthony Holder (MRN [id number]) as of 03/30/2012 12:37  Ref. Range 03/27/2012 11:20 03/29/2012 17:09 03/30/2012 07:48 03/30/2012 12:01  Glucose-Capillary Latest Range: 70-99 mg/dL 272 (H) 536 (H) 644 (H) 179 (H)    Inpatient Diabetes Program Recommendations Insulin - Basal: Add Lantus 10 units QHS Insulin - Meal Coverage: When diet advanced, will probably benefit from meal coverage insulin - Novolog 3 units tidwc Outpatient Referral: OP Diabetes Education consult for HgbA1C > 7. Diet: CHO-mod med  Note: Will follow.

## 2012-03-30 NOTE — Progress Notes (Signed)
Received patient from PACU.  Patient awake, alert and oriented.  Patient with complaint of pain in left foot at surgical site.  Morphine 1mg  IV given with moderate relief.  Patient denies nausea and was able to tolerate liquids and he will order solid food for dinner. Anthony Holder [facility]  03/30/2012

## 2012-03-30 NOTE — Progress Notes (Signed)
Attempted to see pt for eval today.  Pt is scheduled to have partial amputation of L second toe later today.  As of now, pt is fairly independent with adls even with necrotic toes.  Pt might need OT after amputation.  PLEASE reorder both OT and PT post surgery so we can re evaluate pts status after amputation. Tory Emerald, Nathalie [phone]

## 2012-03-30 NOTE — Transfer of Care (Signed)
Immediate Anesthesia Transfer of Care Note  Patient: Anthony Holder  Procedure(s) Performed: Procedure(s) (LRB): AMPUTATION DIGIT (Left)  Patient Location: PACU  Anesthesia Type: General  Level of Consciousness: sedated, patient cooperative and responds to stimulaton  Airway & Oxygen Therapy: Patient Spontanous Breathing and Patient connected to face mask oxgen  Post-op Assessment: Report given to PACU RN and Post -op Vital signs reviewed and stable  Post vital signs: Reviewed and stable  Complications: No apparent anesthesia complications

## 2012-03-30 NOTE — Anesthesia Preprocedure Evaluation (Addendum)
Anesthesia Evaluation  Patient identified by MRN, date of birth, ID band Patient awake    Reviewed: Allergy & Precautions, H&P , NPO status , Patient's Chart, lab work & pertinent test results, reviewed documented beta blocker date and time   Airway Mallampati: III TM Distance: >3 FB Neck ROM: Full    Dental  (+) Edentulous Upper, Edentulous Lower and Dental Advisory Given   Pulmonary neg pulmonary ROS,  breath sounds clear to auscultation  Pulmonary exam normal       Cardiovascular hypertension, Pt. on medications and Pt. on home beta blockers + CAD, + CABG, + Peripheral Vascular Disease and +CHF + Valvular Problems/Murmurs (mild-moderate AS) AS Rhythm:Regular Rate:Normal     Neuro/Psych CVA, Residual Symptoms negative psych ROS   GI/Hepatic negative GI ROS, Neg liver ROS,   Endo/Other  Diabetes mellitus-, Poorly Controlled, Type 2, Insulin DependentMorbid obesity  Renal/GU negative Renal ROS  negative genitourinary   Musculoskeletal negative musculoskeletal ROS (+)   Abdominal (+) + obese,   Peds negative pediatric ROS (+)  Hematology negative hematology ROS (+)   Anesthesia Other Findings   Reproductive/Obstetrics negative OB ROS                          Anesthesia Physical Anesthesia Plan  ASA: III  Anesthesia Plan:    Post-op Pain Management:    Induction: Intravenous  Airway Management Planned: LMA  Additional Equipment:   Intra-op Plan:   Post-operative Plan: Extubation in OR  Informed Consent: I have reviewed the patients History and Physical, chart, labs and discussed the procedure including the risks, benefits and alternatives for the proposed anesthesia with the patient or authorized representative who has indicated his/her understanding and acceptance.   Dental advisory given  Plan Discussed with: CRNA  Anesthesia Plan Comments:         Anesthesia Quick  Evaluation

## 2012-03-30 NOTE — Progress Notes (Signed)
INFECTIOUS DISEASE PROGRESS NOTE  ID: Anthony Holder is a 71 y.o. male with   Principal Problem:  *Necrotic toes Active Problems:  DIABETES MELLITUS, TYPE II  DYSLIPIDEMIA  HYPERTENSION  CAD  CHF  CAROTID ARTERY DISEASE  Cellulitis and abscess of foot  Diabetic ulcer of toe  Leukocytosis  Hyponatremia  CVA (cerebral infarction)  Osteomyelitis of ankle or foot  Aortic stenosis  Subjective: C/o increased pain, swelling  Abtx:  Anti-infectives     Start     Dose/Rate Route Frequency Ordered Stop   03/30/12 0000  piperacillin-tazobactam (ZOSYN) IVPB 3.375 g       3.375 g 12.5 mL/hr over 240 Minutes Intravenous Every 8 hours 03/29/12 1946     03/29/12 2200   vancomycin (VANCOCIN) 1,250 mg in sodium chloride 0.9 % 250 mL IVPB        1,250 mg 166.7 mL/hr over 90 Minutes Intravenous Every 12 hours 03/29/12 1520     03/29/12 1445   ciprofloxacin (CIPRO) IVPB 400 mg  Status:  Discontinued        400 mg 200 mL/hr over 60 Minutes Intravenous Every 12 hours 03/29/12 1433 03/29/12 1944   03/29/12 1130  piperacillin-tazobactam (ZOSYN) IVPB 3.375 g       3.375 g 12.5 mL/hr over 240 Minutes Intravenous  Once 03/29/12 1055 03/29/12 1939   03/29/12 1100   vancomycin (VANCOCIN) IVPB 1000 mg/200 mL premix        1,000 mg 200 mL/hr over 60 Minutes Intravenous  Once 03/29/12 1026 03/29/12 1254          Medications:  Scheduled:   . aspirin  325 mg Oral Daily  . atenolol  50 mg Oral Daily  . chlorhexidine  60 mL Topical Once  . colesevelam  1,875 mg Oral BID  . vitamin B-12  500 mcg Oral BID  . docusate sodium  100 mg Oral BID  . furosemide  40 mg Oral BID  . insulin aspart  0-15 Units Subcutaneous TID WC  . Mag Oxide-Vit D3-Turmeric  2 capsule Oral BID  . pantoprazole  40 mg Oral Q0600  . pantoprazole (PROTONIX) IV  40 mg Intravenous Once  . piperacillin-tazobactam (ZOSYN)  IV  3.375 g Intravenous Once  . piperacillin-tazobactam (ZOSYN)  IV  3.375 g Intravenous Q8H  .  potassium chloride SA  20 mEq Oral BID  . vancomycin  1,250 mg Intravenous Q12H  . vancomycin  1,000 mg Intravenous Once  . vitamin C  500 mg Oral QID  . DISCONTD: ciprofloxacin  400 mg Intravenous Q12H  . DISCONTD: enoxaparin  40 mg Subcutaneous Q24H  . DISCONTD: Mag Oxide-Vit D3-Turmeric  2 capsule Oral BID  . DISCONTD: vitamin B-12  500 mcg Oral BID    Objective: Vital signs in last 24 hours: Temp:  [98.2 F (36.8 C)-99.5 F (37.5 C)] 99.5 F (37.5 C) (04/11 0400) Pulse Rate:  [81-90] 85  (04/11 0400) Resp:  [18-22] 18  (04/11 0400) BP: (126-146)/(53-79) 126/53 mmHg (04/11 0400) SpO2:  [95 %-98 %] 96 % (04/11 0400) Weight:  [96.616 kg (213 lb)] 96.616 kg (213 lb) (04/10 0955)   General appearance: alert, cooperative and no distress Extremities: L foot with mild increase in erythema. 2nd toe unchanged.   Lab Results  Basename 03/30/12 0400 03/29/12 1450 03/29/12 1030  WBC 13.8* 16.2* --  HGB 11.5* 12.5* --  HCT 34.3* 37.6* --  NA 130* -- 129*  K 3.8 -- 4.3  CL 97 -- 97  CO2 24 -- 22  BUN 16 -- 14  CREATININE 1.03 0.96 --  GLU -- -- --   Liver Panel  Basename 03/30/12 0400 03/29/12 1450  PROT 6.7 7.5  ALBUMIN 2.7* 3.2*  AST 15 12  ALT 9 9  ALKPHOS 68 68  BILITOT 0.4 0.4  BILIDIR -- 0.1  IBILI -- 0.3   Sedimentation Rate No results found for this basename: ESRSEDRATE in the last 72 hours C-Reactive Protein No results found for this basename: CRP:2 in the last 72 hours  Microbiology: Recent Results (from the past 240 hour(s))  CULTURE, BLOOD (ROUTINE X 2)     Status: Normal (Preliminary result)   Collection Time   03/29/12 10:30 AM      Component Value Range Status Comment   Specimen Description BLOOD LEFT HAND   Final    Special Requests BOTTLES DRAWN AEROBIC ONLY   Final    Culture  Setup Time [id number]   Final    Culture     Final    Value:        BLOOD CULTURE RECEIVED NO GROWTH TO DATE CULTURE WILL BE HELD FOR 5 DAYS BEFORE ISSUING A FINAL  NEGATIVE REPORT   Report Status PENDING   Incomplete   CULTURE, BLOOD (ROUTINE X 2)     Status: Normal (Preliminary result)   Collection Time   03/29/12 11:09 AM      Component Value Range Status Comment   Specimen Description BLOOD RIGHT WRIST   Final    Special Requests BOTTLES DRAWN AEROBIC ONLY   Final    Culture  Setup Time [id number]   Final    Culture     Final    Value:        BLOOD CULTURE RECEIVED NO GROWTH TO DATE CULTURE WILL BE HELD FOR 5 DAYS BEFORE ISSUING A FINAL NEGATIVE REPORT   Report Status PENDING   Incomplete     Studies/Results: [name]  03/30/2012  *RADIOLOGY REPORT*  Clinical Data: Diabetes.  Necrotic second toe.  MRI OF THE LEFT FOREFOOT WITHOUT AND WITH CONTRAST  Technique:  Multiplanar, multisequence MR imaging was performed both before and after administration of intravenous contrast.  Contrast: 20mL MULTIHANCE GADOBENATE DIMEGLUMINE 529 MG/ML IV SOLN  Comparison: 03/29/2012  Findings: There is been resorption of most of the terminal phalanx of the second toe, with associated soft tissue defect.  A small sliver of residual base of the terminal phalanx is still present.  The remaining phalanges of the second toe and in the remaining toes do not demonstrate significant abnormal internal edema signal to suggest active osteomyelitis.  Similarly the metatarsals and adjacent tarsal bones do not demonstrate MR findings of osteomyelitis.  The Lisfranc ligament appears intact.  Subcutaneous edema and low-level subcutaneous enhancement along the dorsum of the foot are suspicious for cellulitis.  There is also mildly greater than expected enhancement infiltrating the plantar musculature of the foot, potentially from myositis.  IMPRESSION:  1.  Resorption of the terminal phalanx of the second toe, without definite findings of osteomyelitis in the remaining phalanges, remaining toes, or remaining forefoot. 2.  Subcutaneous edema and enhancement suggesting  cellulitis dorsally. 3.  Mildly greater than expected enhancement in the plantar musculature of the foot suggesting myositis.  No drainable abscess observed.  Original Report Authenticated By: Dellia Cloud, M.D.   Dg Chest Port 1 View  03/29/2012  *RADIOLOGY REPORT*  Clinical Data: Preop toe indication.  Hypertension, diabetes.  PORTABLE CHEST - 1 VIEW  Comparison: 03/08/2012  Findings: Prior CABG.  Heart is borderline in size.  Chronic blunting of the left costophrenic angle, likely scarring/pleural thickening.  Mild peribronchial thickening.  No acute opacities or effusions.  IMPRESSION: Scarring/pleural thickening at the left lung base.  Borderline heart size.  Mild bronchitic changes.  Original Report Authenticated By: Cyndie Chime, M.D.   Dg Foot Complete Left  03/29/2012  *RADIOLOGY REPORT*  Clinical Data: Preop left second toe amputation.  Gangrene. Diabetic.  LEFT FOOT - COMPLETE 3+ VIEW  Comparison: None.  Findings: Bone destruction noted at the tip of the left second toe involving much of the distal phalanx.  Soft tissue defect also noted.  Soft tissue gas within the second toe.  No definite osteomyelitis changes within the middle or proximal phalanges.  Degenerative changes in the first MTP joint.  IMPRESSION: Changes of osteomyelitis and bone destruction in the distal phalanx of the left second toe.  Soft tissue defect and soft tissue gas also noted.  Original Report Authenticated By: Cyndie Chime, M.D.     Assessment/Plan: Diabetic Foot Infection/Osteomyelitis/Myositis Would contniue vanco/zosyn (day 2) Await BCx (NGTD) Possible OR today.  Mild worsening of erythema not surprising initially.    Anthony Holder Infectious Diseases [phone] 03/30/2012, 9:17 AM  1

## 2012-03-31 LAB — DIFFERENTIAL
Basophils Relative: 0 % (ref 0–1)
Eosinophils Absolute: 0.1 10*3/uL (ref 0.0–0.7)
Eosinophils Relative: 1 % (ref 0–5)
Lymphs Abs: 1.6 10*3/uL (ref 0.7–4.0)
Monocytes Absolute: 1.4 10*3/uL — ABNORMAL HIGH (ref 0.1–1.0)
Monocytes Relative: 11 % (ref 3–12)
Neutrophils Relative %: 75 % (ref 43–77)

## 2012-03-31 LAB — BASIC METABOLIC PANEL
BUN: 13 mg/dL (ref 6–23)
Creatinine, Ser: 1.05 mg/dL (ref 0.50–1.35)
GFR calc Af Amer: 82 mL/min — ABNORMAL LOW (ref >90)
GFR calc non Af Amer: 70 mL/min — ABNORMAL LOW (ref >90)
Glucose, Bld: 255 mg/dL — ABNORMAL HIGH (ref 70–99)
Potassium: 4 mEq/L (ref 3.5–5.1)

## 2012-03-31 LAB — CBC
Hemoglobin: 11.1 g/dL — ABNORMAL LOW (ref 13.0–17.0)
MCH: 28.5 pg (ref 26.0–34.0)
MCHC: 32.9 g/dL (ref 30.0–36.0)
RDW: 13.2 % (ref 11.5–15.5)

## 2012-03-31 LAB — GLUCOSE, CAPILLARY
Glucose-Capillary: 256 mg/dL — ABNORMAL HIGH (ref 70–99)
Glucose-Capillary: 229 mg/dL — ABNORMAL HIGH (ref 70–99)

## 2012-03-31 MED ORDER — INSULIN GLARGINE 100 UNIT/ML ~~LOC~~ SOLN
5.0000 [IU] | SUBCUTANEOUS | Status: DC
  Administered 2012-03-31 – 2012-04-01 (×2): 5 [IU] via SUBCUTANEOUS

## 2012-03-31 NOTE — Progress Notes (Signed)
   CARE MANAGEMENT NOTE 03/31/2012  Patient:  Anthony Holder, Anthony Holder   Account Number:  [id number]  Date Initiated:  03/31/2012  Documentation initiated by:  Raiford Noble  Subjective/Objective Assessment:   PT ADM WITH NECROTIC TOES     Action/Plan:   LIVES WITH WIFE-- SET UP WITH [facility] FOR PT/OT/RN SERVICES- HAS RW AT HOME   Anticipated DC Date:  04/02/2012   Anticipated DC Plan:  HOME W HOME HEALTH SERVICES      DC Planning Services  CM consult      [facility] Choice  HOME HEALTH   Choice offered to / List presented to:  C-1 Patient   DME arranged  NA      DME agency  NA     HH arranged  HH-1 RN  HH-2 PT  HH-3 OT      [facility] agency  [facility]   Status of service:  In process, will continue to follow Medicare Important Message given?   (If response is "NO", the following Medicare IM given date fields will be blank) Date Medicare IM given:   Date Additional Medicare IM given:    Discharge Disposition:    Per UR Regulation:  Reviewed for med. necessity/level of care/duration of stay  If discussed at Long Length of Stay Meetings, dates discussed:    Comments:  04-01-12 Raiford Noble, RN,[name],CM [phone] Arranged HHC with [facility] for PT/OT/RN services. Norberta Keens made aware. Need HHC orders. .MD paged.

## 2012-03-31 NOTE — Progress Notes (Signed)
INFECTIOUS DISEASE PROGRESS NOTE  ID: Anthony Holder is a 71 y.o. male with   Principal Problem:  *Necrotic toes Active Problems:  DIABETES MELLITUS, TYPE II  DYSLIPIDEMIA  HYPERTENSION  CAD  CHF  CAROTID ARTERY DISEASE  Cellulitis and abscess of foot  Diabetic ulcer of toe  Leukocytosis  Hyponatremia  CVA (cerebral infarction)  Osteomyelitis of ankle or foot  Aortic stenosis  Subjective: Resting quietly  Abtx:  Anti-infectives     Start     Dose/Rate Route Frequency Ordered Stop   03/30/12 0000  piperacillin-tazobactam (ZOSYN) IVPB 3.375 g       3.375 g 12.5 mL/hr over 240 Minutes Intravenous Every 8 hours 03/29/12 1946     03/29/12 2200   vancomycin (VANCOCIN) 1,250 mg in sodium chloride 0.9 % 250 mL IVPB        1,250 mg 166.7 mL/hr over 90 Minutes Intravenous Every 12 hours 03/29/12 1520     03/29/12 1445   ciprofloxacin (CIPRO) IVPB 400 mg  Status:  Discontinued        400 mg 200 mL/hr over 60 Minutes Intravenous Every 12 hours 03/29/12 1433 03/29/12 1944   03/29/12 1130  piperacillin-tazobactam (ZOSYN) IVPB 3.375 g       3.375 g 12.5 mL/hr over 240 Minutes Intravenous  Once 03/29/12 1055 03/29/12 1939   03/29/12 1100   vancomycin (VANCOCIN) IVPB 1000 mg/200 mL premix        1,000 mg 200 mL/hr over 60 Minutes Intravenous  Once 03/29/12 1026 03/29/12 1254          Medications:  Scheduled:   . aspirin  325 mg Oral Daily  . atenolol  50 mg Oral Daily  . colesevelam  1,875 mg Oral BID  . vitamin B-12  500 mcg Oral BID  . docusate sodium  100 mg Oral BID  . furosemide  40 mg Oral BID  . insulin aspart  0-15 Units Subcutaneous TID WC  . insulin glargine  5 Units Subcutaneous BH-q7a  . Mag Oxide-Vit D3-Turmeric  2 capsule Oral BID  . pantoprazole  40 mg Oral Q0600  . piperacillin-tazobactam (ZOSYN)  IV  3.375 g Intravenous Q8H  . potassium chloride SA  20 mEq Oral BID  . vancomycin  1,250 mg Intravenous Q12H  . vitamin C  500 mg Oral QID     Objective: Vital signs in last 24 hours: Temp:  [97.2 F (36.2 C)-102.6 F (39.2 C)] 98.4 F (36.9 C) (04/12 1323) Pulse Rate:  [69-98] 76  (04/12 1323) Resp:  [15-24] 20  (04/12 1323) BP: (112-143)/(55-83) 138/77 mmHg (04/12 1323) SpO2:  [95 %-100 %] 96 % (04/12 1323)   General appearance: asleep Extremities: L foot wrapped.   Lab Results  Basename 03/31/12 0345 03/30/12 0400  WBC 12.8* 13.8*  HGB 11.1* 11.5*  HCT 33.7* 34.3*  NA 130* 130*  K 4.0 3.8  CL 99 97  CO2 20 24  BUN 13 16  CREATININE 1.05 1.03  GLU -- --   Liver Panel  Basename 03/30/12 0400 03/29/12 1450  PROT 6.7 7.5  ALBUMIN 2.7* 3.2*  AST 15 12  ALT 9 9  ALKPHOS 68 68  BILITOT 0.4 0.4  BILIDIR -- 0.1  IBILI -- 0.3   Sedimentation Rate No results found for this basename: ESRSEDRATE in the last 72 hours C-Reactive Protein No results found for this basename: CRP:2 in the last 72 hours  Microbiology: Recent Results (from the past 240 hour(s))  CULTURE, BLOOD (ROUTINE  X 2)     Status: Normal (Preliminary result)   Collection Time   03/29/12 10:30 AM      Component Value Range Status Comment   Specimen Description BLOOD LEFT HAND   Final    Special Requests BOTTLES DRAWN AEROBIC ONLY   Final    Culture  Setup Time [id number]   Final    Culture     Final    Value:        BLOOD CULTURE RECEIVED NO GROWTH TO DATE CULTURE WILL BE HELD FOR 5 DAYS BEFORE ISSUING A FINAL NEGATIVE REPORT   Report Status PENDING   Incomplete   CULTURE, BLOOD (ROUTINE X 2)     Status: Normal (Preliminary result)   Collection Time   03/29/12 11:09 AM      Component Value Range Status Comment   Specimen Description BLOOD RIGHT WRIST   Final    Special Requests BOTTLES DRAWN AEROBIC ONLY   Final    Culture  Setup Time [id number]   Final    Culture     Final    Value:        BLOOD CULTURE RECEIVED NO GROWTH TO DATE CULTURE WILL BE HELD FOR 5 DAYS BEFORE ISSUING A FINAL NEGATIVE REPORT   Report Status  PENDING   Incomplete   URINE CULTURE     Status: Normal   Collection Time   03/29/12  3:03 PM      Component Value Range Status Comment   Specimen Description URINE, CLEAN CATCH   Final    Special Requests NONE   Final    Culture  Setup Time [id number]   Final    Colony Count NO GROWTH   Final    Culture NO GROWTH   Final    Report Status 03/30/2012 FINAL   Final   TISSUE CULTURE     Status: Normal (Preliminary result)   Collection Time   03/30/12  2:17 PM      Component Value Range Status Comment   Specimen Description TOE LEFT SECOND   Final    Special Requests NONE   Final    Gram Stain     Final    Value: NO WBC SEEN     RARE SQUAMOUS EPITHELIAL CELLS PRESENT     RARE GRAM POSITIVE COCCI IN PAIRS   Culture NO GROWTH   Final    Report Status PENDING   Incomplete   ANAEROBIC CULTURE     Status: Normal (Preliminary result)   Collection Time   03/30/12  2:18 PM      Component Value Range Status Comment   Specimen Description TISSUE TOE LEFT SECOND   Final    Special Requests NONE   Final    Gram Stain     Final    Value: NO WBC SEEN     RARE SQUAMOUS EPITHELIAL CELLS PRESENT     RARE GRAM POSITIVE COCCI IN PAIRS   Culture     Final    Value: NO ANAEROBES ISOLATED; CULTURE IN PROGRESS FOR 5 DAYS   Report Status PENDING   Incomplete   AFB CULTURE WITH SMEAR     Status: Normal (Preliminary result)   Collection Time   03/30/12  2:18 PM      Component Value Range Status Comment   Specimen Description TISSUE TOE LEFT SECOND   Final    Special Requests NONE   Final    ACID FAST SMEAR NO ACID  FAST BACILLI SEEN   Final    Culture     Final    Value: CULTURE WILL BE EXAMINED FOR 6 WEEKS BEFORE ISSUING A FINAL REPORT   Report Status PENDING   Incomplete     Studies/Results: [name]  03/30/2012  *RADIOLOGY REPORT*  Clinical Data: Diabetes.  Necrotic second toe.  MRI OF THE LEFT FOREFOOT WITHOUT AND WITH CONTRAST  Technique:  Multiplanar, multisequence MR imaging  was performed both before and after administration of intravenous contrast.  Contrast: 20mL MULTIHANCE GADOBENATE DIMEGLUMINE 529 MG/ML IV SOLN  Comparison: 03/29/2012  Findings: There is been resorption of most of the terminal phalanx of the second toe, with associated soft tissue defect.  A small sliver of residual base of the terminal phalanx is still present.  The remaining phalanges of the second toe and in the remaining toes do not demonstrate significant abnormal internal edema signal to suggest active osteomyelitis.  Similarly the metatarsals and adjacent tarsal bones do not demonstrate MR findings of osteomyelitis.  The Lisfranc ligament appears intact.  Subcutaneous edema and low-level subcutaneous enhancement along the dorsum of the foot are suspicious for cellulitis.  There is also mildly greater than expected enhancement infiltrating the plantar musculature of the foot, potentially from myositis.  IMPRESSION:  1.  Resorption of the terminal phalanx of the second toe, without definite findings of osteomyelitis in the remaining phalanges, remaining toes, or remaining forefoot. 2.  Subcutaneous edema and enhancement suggesting cellulitis dorsally. 3.  Mildly greater than expected enhancement in the plantar musculature of the foot suggesting myositis.  No drainable abscess observed.  Original Report Authenticated By: Dellia Cloud, M.D.     Assessment/Plan: Diabetic Foot Infection/Osteomyelitis/Myositis/gangrene Would contniue vanco/zosyn (day 3)  Await BCx (NGTD) , Op Cx (gram stain shows GPC) Day 2 post op Will f/u on monday  Johny Sax Infectious Diseases [phone] 03/31/2012, 3:55 PM   LOS: 2 days

## 2012-03-31 NOTE — Progress Notes (Signed)
Received call back from [place], [place], no new orders. Will cont to monitor pt. Sidney Ace

## 2012-03-31 NOTE — Progress Notes (Addendum)
Pt's temp is 102.6 orally. PA for Dr. Victorino Dike paged, awaiting call back. Pt medicated with Tylenol. Will cont to monitor. GYates,RN Pt also using IS, but only getting it up to 750. Sidney Ace
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
## 2012-03-31 NOTE — Progress Notes (Signed)
Subjective: 1 Day Post-Op Procedure(s) (LRB): AMPUTATION DIGIT (Left) Patient reports pain as mild.  Worse with ambulation.  Objective: Vital signs in last 24 hours: Temp:  [97.2 F (36.2 C)-102.6 F (39.2 C)] 98.4 F (36.9 C) (04/12 1323) Pulse Rate:  [69-98] 76  (04/12 1323) Resp:  [15-24] 20  (04/12 1323) BP: (112-143)/(54-83) 138/77 mmHg (04/12 1323) SpO2:  [95 %-100 %] 96 % (04/12 1323)  Intake/Output from previous day: 04/11 0701 - 04/12 0700 In: 1740 [P.O.:240; I.V.:1500] Out: 2000 [Urine:2000] Intake/Output this shift: Total I/O In: 240 [P.O.:240] Out: 600 [Urine:600]   Basename 03/31/12 0345 03/30/12 0400 03/29/12 1450 03/29/12 1030  HGB 11.1* 11.5* 12.5* 12.1*    Basename 03/31/12 0345 03/30/12 0400  WBC 12.8* 13.8*  RBC 3.89* 3.98*  HCT 33.7* 34.3*  PLT 246 237    Basename 03/31/12 0345 03/30/12 0400  NA 130* 130*  K 4.0 3.8  CL 99 97  CO2 20 24  BUN 13 16  CREATININE 1.05 1.03  GLUCOSE 255* 249*  CALCIUM 8.6 8.6    Basename 03/29/12 1030  LABPT --  INR 1.21   Cxs:  No growth to date.  GS shows gm+ cocci  wound dressed and dry.  Assessment/Plan: 1 Day Post-Op Procedure(s) (LRB): AMPUTATION DIGIT (Left) Continue IV abx per ID recs.  Await cx results to tailor abx.  Continue WBAT in hard sole shoe.  I'll f/u with pt on Monday.  Toni Arthurs 03/31/2012, 1:47 PM

## 2012-03-31 NOTE — Progress Notes (Signed)
Subjective: Patient c/o pain in foot. States some decrease in swelling.   Objective: Vital signs in last 24 hours: Filed Vitals:   03/30/12 2215 03/31/12 0215 03/31/12 0330 03/31/12 0551  BP: 135/83 143/77  123/62  Pulse: 91 98  69  Temp:  102.6 F (39.2 C) 100.3 F (37.9 C) 97.9 F (36.6 C)  TempSrc: Oral Oral Oral Oral  Resp: 16 24  20   Height:      Weight:      SpO2: 98% 100%  96%    Intake/Output Summary (Last 24 hours) at 03/31/12 1110 Last data filed at 03/31/12 0830  Gross per 24 hour  Intake   1980 ml  Output   1500 ml  Net    480 ml    Weight change:   General: Alert, awake, oriented x3, in no acute distress. HEENT: No bruits, no goiter. Heart: Regular rate and rhythm, without murmurs, rubs, gallops. Lungs: Clear to auscultation bilaterally. Abdomen: Soft, nontender, nondistended, positive bowel sounds. Extremities: No clubbing cyanosis . Gangrenous left 2nd toe.S/P AMPUTATION WITH BANDAGE ON FOOT.  Neuro: Grossly intact, nonfocal.    Lab Results:  Basename 03/31/12 0345 03/30/12 0400 03/29/12 1450  NA 130* 130* --  K 4.0 3.8 --  CL 99 97 --  CO2 20 24 --  GLUCOSE 255* 249* --  BUN 13 16 --  CREATININE 1.05 1.03 --  CALCIUM 8.6 8.6 --  MG -- -- 2.1  PHOS -- -- --    Basename 03/30/12 0400 03/29/12 1450  AST 15 12  ALT 9 9  ALKPHOS 68 68  BILITOT 0.4 0.4  PROT 6.7 7.5  ALBUMIN 2.7* 3.2*   No results found for this basename: LIPASE:2,AMYLASE:2 in the last 72 hours  Basename 03/31/12 0345 03/30/12 0400  WBC 12.8* 13.8*  NEUTROABS 9.6* 9.8*  HGB 11.1* 11.5*  HCT 33.7* 34.3*  MCV 86.6 86.2  PLT 246 237   No results found for this basename: CKTOTAL:3,CKMB:3,CKMBINDEX:3,TROPONINI:3 in the last 72 hours No components found with this basename: POCBNP:3 No results found for this basename: DDIMER:2 in the last 72 hours  Basename 03/29/12 1450  HGBA1C 9.6*   No results found for this basename:  CHOL:2,HDL:2,LDLCALC:2,TRIG:2,CHOLHDL:2,LDLDIRECT:2 in the last 72 hours  Basename 03/29/12 1450  TSH 1.176  T4TOTAL --  T3FREE --  THYROIDAB --   No results found for this basename: VITAMINB12:2,FOLATE:2,FERRITIN:2,TIBC:2,IRON:2,RETICCTPCT:2 in the last 72 hours  Micro Results: Recent Results (from the past 240 hour(s))  CULTURE, BLOOD (ROUTINE X 2)     Status: Normal (Preliminary result)   Collection Time   03/29/12 10:30 AM      Component Value Range Status Comment   Specimen Description BLOOD LEFT HAND   Final    Special Requests BOTTLES DRAWN AEROBIC ONLY   Final    Culture  Setup Time [id number]   Final    Culture     Final    Value:        BLOOD CULTURE RECEIVED NO GROWTH TO DATE CULTURE WILL BE HELD FOR 5 DAYS BEFORE ISSUING A FINAL NEGATIVE REPORT   Report Status PENDING   Incomplete   CULTURE, BLOOD (ROUTINE X 2)     Status: Normal (Preliminary result)   Collection Time   03/29/12 11:09 AM      Component Value Range Status Comment   Specimen Description BLOOD RIGHT WRIST   Final    Special Requests BOTTLES DRAWN AEROBIC ONLY   Final  Culture  Setup Time [id number]   Final    Culture     Final    Value:        BLOOD CULTURE RECEIVED NO GROWTH TO DATE CULTURE WILL BE HELD FOR 5 DAYS BEFORE ISSUING A FINAL NEGATIVE REPORT   Report Status PENDING   Incomplete   URINE CULTURE     Status: Normal   Collection Time   03/29/12  3:03 PM      Component Value Range Status Comment   Specimen Description URINE, CLEAN CATCH   Final    Special Requests NONE   Final    Culture  Setup Time [id number]   Final    Colony Count NO GROWTH   Final    Culture NO GROWTH   Final    Report Status 03/30/2012 FINAL   Final   TISSUE CULTURE     Status: Normal (Preliminary result)   Collection Time   03/30/12  2:17 PM      Component Value Range Status Comment   Specimen Description TOE LEFT SECOND   Final    Special Requests NONE   Final    Gram Stain     Final    Value: NO WBC  SEEN     RARE SQUAMOUS EPITHELIAL CELLS PRESENT     RARE GRAM POSITIVE COCCI IN PAIRS   Culture NO GROWTH   Final    Report Status PENDING   Incomplete   ANAEROBIC CULTURE     Status: Normal (Preliminary result)   Collection Time   03/30/12  2:18 PM      Component Value Range Status Comment   Specimen Description TISSUE TOE LEFT SECOND   Final    Special Requests NONE   Final    Gram Stain     Final    Value: NO WBC SEEN     RARE SQUAMOUS EPITHELIAL CELLS PRESENT     RARE GRAM POSITIVE COCCI IN PAIRS   Culture PENDING   Incomplete    Report Status PENDING   Incomplete     Studies/Results: [name]  03/30/2012  *RADIOLOGY REPORT*  Clinical Data: Diabetes.  Necrotic second toe.  MRI OF THE LEFT FOREFOOT WITHOUT AND WITH CONTRAST  Technique:  Multiplanar, multisequence MR imaging was performed both before and after administration of intravenous contrast.  Contrast: 20mL MULTIHANCE GADOBENATE DIMEGLUMINE 529 MG/ML IV SOLN  Comparison: 03/29/2012  Findings: There is been resorption of most of the terminal phalanx of the second toe, with associated soft tissue defect.  A small sliver of residual base of the terminal phalanx is still present.  The remaining phalanges of the second toe and in the remaining toes do not demonstrate significant abnormal internal edema signal to suggest active osteomyelitis.  Similarly the metatarsals and adjacent tarsal bones do not demonstrate MR findings of osteomyelitis.  The Lisfranc ligament appears intact.  Subcutaneous edema and low-level subcutaneous enhancement along the dorsum of the foot are suspicious for cellulitis.  There is also mildly greater than expected enhancement infiltrating the plantar musculature of the foot, potentially from myositis.  IMPRESSION:  1.  Resorption of the terminal phalanx of the second toe, without definite findings of osteomyelitis in the remaining phalanges, remaining toes, or remaining forefoot. 2.  Subcutaneous  edema and enhancement suggesting cellulitis dorsally. 3.  Mildly greater than expected enhancement in the plantar musculature of the foot suggesting myositis.  No drainable abscess observed.  Original Report Authenticated By: Zollie Beckers  D. Ova Freshwater, M.D.    Medications:     . aspirin  325 mg Oral Daily  . atenolol  50 mg Oral Daily  . colesevelam  1,875 mg Oral BID  . vitamin B-12  500 mcg Oral BID  . docusate sodium  100 mg Oral BID  . furosemide  40 mg Oral BID  . insulin aspart  0-15 Units Subcutaneous TID WC  . insulin glargine  5 Units Subcutaneous BH-q7a  . Mag Oxide-Vit D3-Turmeric  2 capsule Oral BID  . pantoprazole  40 mg Oral Q0600  . piperacillin-tazobactam (ZOSYN)  IV  3.375 g Intravenous Q8H  . potassium chloride SA  20 mEq Oral BID  . vancomycin  1,250 mg Intravenous Q12H  . vitamin C  500 mg Oral QID    Assessment: Principal Problem:  *Necrotic toes Active Problems:  DIABETES MELLITUS, TYPE II  DYSLIPIDEMIA  HYPERTENSION  CAD  CHF  CAROTID ARTERY DISEASE  Cellulitis and abscess of foot  Diabetic ulcer of toe  Leukocytosis  Hyponatremia  CVA (cerebral infarction)  Osteomyelitis of ankle or foot  Aortic stenosis   Plan: #1 left second necrotic(gangrene) toe/osteomyelitis/left foot cellulitis /diabetic foot infection/myositis  S/P 2nd toe amputation yesterday. Patient did have a diabetic toe also which has worsened into necrotic toes with osteomyelitis and left foot cellulitis. MRI with myositis, cellulitis and no osteomyelitis in remaining phalanges.  Blood cultures are pending. Continue IV vancomycin and zosyn. Ortho and ID ff and appreciate input and rxcs. PT OT. Diabetes management.  #2 leukocytosis  Likely secondary to problem #1. WBC trending down. Chest x-ray is negative. UA negative. Blood cultures are pending. Continue empiric IV vancomycin and zosyn and follow.  #3 hyponatremia  Slowly improving. Check a TSH WNL. Chest x-ray is negative. Continue  IVF. #4 hypertension  Stable. Continue home regimen.  #5 coronary artery disease/CHF/status post CABG/mild-to-moderate aortic stenosis  Stable. Patient is currently compensated. Continue home regimen of aspirin, atenolol, Lasix. EKG is normal sinus rhythm. Will follow.  #6 history of CVA  Stable. Continue aspirin and risk factor modification. PT.  #7 type 2 diabetes   hemoglobin A1c = 9.6. CBG [id number]. Will hold oral hypoglycemics. We'll place on lantus 5 UNITS. Continue sliding scale insulin.  #8 prophylaxis  PPI for GI prophylaxis. Lovenox for DVT prophylaxis.  Sila Sarsfield  03/29/2012, 1:34 PM     LOS: 2 days   [facility] 03/31/2012, 11:09 AM
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
## 2012-03-31 NOTE — Op Note (Signed)
NAMEHORST, OSTERMILLER NO.:  [id number]  MEDICAL RECORD NO.:  [id number]  LOCATION:  1321                         FACILITY:  [facility]  PHYSICIAN:  Toni Arthurs, MD        DATE OF BIRTH:  04/19/1941  DATE OF PROCEDURE:  03/30/2012 DATE OF DISCHARGE:                              OPERATIVE REPORT   PREOPERATIVE DIAGNOSIS:  Left second toe gangrene.  POSTOPERATIVE DIAGNOSIS:  Left second toe gangrene.  PROCEDURE:  Left second toe amputation through the metatarsophalangeal joint.  SURGEON:  Toni Arthurs, MD  ANESTHESIA:  General.  ESTIMATED BLOOD LOSS:  Minimal.  TOURNIQUET TIME:  14 minutes with an ankle Esmarch.  COMPLICATIONS:  None apparent.  DISPOSITION:  Extubated, awake, and stable to recovery.  INDICATIONS FOR PROCEDURE:  The patient is a 71 year old male with left second toe gangrene.  He has an MRI, which shows no evidence of osteomyelitis proximal to the toe.  He presents now for amputation of this toe.  He understands the risks and benefits, the alternative treatment options, and elects to proceed with surgical treatment.  He specifically understands risks of bleeding, infection, nerve damage, blood clots, need for additional surgery, revision, amputation, and death.  PROCEDURE IN DETAIL:  After preoperative consent was obtained and the correct operative site was identified, the patient was brought to the operating room and placed supine on the operating table.  General anesthesia was induced.  Surgical time-out was taken.  The patient was already on therapeutic antibiotics.  The left lower extremity was prepped and draped in standard sterile fashion.  A 4-inch Esmarch tourniquet was wrapped around the ankle after exsanguinating the foot. The gangrenous second toe was identified.  A racquet incision was made around the base of the toe.  The toe was disarticulated through the MTP joint and passed off the field as a specimen to Pathology.  There  was some scant purulent material at the plantar aspect of the base of the toe adjacent to the fat pad.  This was debrided and sent as a specimen to Microbiology for aerobic and anaerobic culture.  The wound was then debrided and copiously irrigated.  Hemostasis was achieved.  The surgical incision was repaired with horizontal mattress sutures of 2-0 nylon.  Sterile dressings were applied followed by compression wrap. Tourniquet was released at 14 minutes.  The patient was awaken from anesthesia and transported to recovery room in stable condition.  FOLLOWUP PLAN:  The patient will be returned to the inpatient ward on IV antibiotics and the antibiotics will be tailored based on his culture results in accordance with the infectious disease specialists recommendations.     Toni Arthurs, MD     JH/MEDQ  D:  03/30/2012  T:  03/31/2012  Job:  [id number]

## 2012-04-01 ENCOUNTER — Inpatient Hospital Stay (HOSPITAL_COMMUNITY): Payer: Medicare Other

## 2012-04-01 LAB — GLUCOSE, CAPILLARY
Glucose-Capillary: 222 mg/dL — ABNORMAL HIGH (ref 70–99)
Glucose-Capillary: 208 mg/dL — ABNORMAL HIGH (ref 70–99)
Glucose-Capillary: 254 mg/dL — ABNORMAL HIGH (ref 70–99)
Glucose-Capillary: 229 mg/dL — ABNORMAL HIGH (ref 70–99)
Glucose-Capillary: 197 mg/dL — ABNORMAL HIGH (ref 70–99)

## 2012-04-01 LAB — URINALYSIS, ROUTINE W REFLEX MICROSCOPIC
Glucose, UA: 100 mg/dL — AB
Ketones, ur: NEGATIVE mg/dL
Leukocytes, UA: NEGATIVE
Protein, ur: 30 mg/dL — AB
Urobilinogen, UA: 0.2 mg/dL (ref 0.0–1.0)

## 2012-04-01 LAB — CBC
Hemoglobin: 11.3 g/dL — ABNORMAL LOW (ref 13.0–17.0)
MCH: 28 pg (ref 26.0–34.0)
Platelets: 271 10*3/uL (ref 150–400)
RBC: 4.03 MIL/uL — ABNORMAL LOW (ref 4.22–5.81)
WBC: 13.5 10*3/uL — ABNORMAL HIGH (ref 4.0–10.5)

## 2012-04-01 LAB — URINE MICROSCOPIC-ADD ON

## 2012-04-01 LAB — DIFFERENTIAL
Basophils Absolute: 0.1 10*3/uL (ref 0.0–0.1)
Eosinophils Absolute: 0.2 10*3/uL (ref 0.0–0.7)
Eosinophils Relative: 2 % (ref 0–5)
Lymphocytes Relative: 15 % (ref 12–46)
Lymphs Abs: 2 10*3/uL (ref 0.7–4.0)
Neutrophils Relative %: 73 % (ref 43–77)

## 2012-04-01 LAB — BASIC METABOLIC PANEL
Calcium: 8.5 mg/dL (ref 8.4–10.5)
GFR calc non Af Amer: 83 mL/min — ABNORMAL LOW (ref >90)
Glucose, Bld: 211 mg/dL — ABNORMAL HIGH (ref 70–99)
Potassium: 3.9 mEq/L (ref 3.5–5.1)
Sodium: 132 mEq/L — ABNORMAL LOW (ref 135–145)

## 2012-04-01 LAB — VANCOMYCIN, TROUGH: Vancomycin Tr: 15.8 ug/mL (ref 10.0–20.0)

## 2012-04-01 MED ORDER — INSULIN GLARGINE 100 UNIT/ML ~~LOC~~ SOLN
10.0000 [IU] | Freq: Every day | SUBCUTANEOUS | Status: DC
  Administered 2012-04-02 – 2012-04-03 (×2): 10 [IU] via SUBCUTANEOUS

## 2012-04-01 NOTE — Progress Notes (Signed)
Pt has T 101. On call provider notified. Tylenol administered. Pt already prescribed IV abx regimen and blood cultures were drawn earlier today. Will continue to monitor. Eugene Garnet [facility] 04/01/2012 9:29 PM

## 2012-04-01 NOTE — Progress Notes (Signed)
ANTIBIOTIC CONSULT NOTE - FOLLOW UP  Pharmacy Consult for Vancomycin, Zosyn Indication: L toe osteo and L foot cellulitis  No Known Allergies  Patient Measurements: Height: 5\' 10"  (177.8 cm) Weight: 213 lb (96.616 kg) IBW/kg (Calculated) : 73   Vital Signs: Temp: 99.5 F (37.5 C) (04/13 1715) Temp src: Oral (04/13 1715) BP: 134/81 mmHg (04/13 1715) Pulse Rate: 63  (04/13 1715) Intake/Output from previous day: 04/12 0701 - 04/13 0700 In: 480 [P.O.:480] Out: 1500 [Urine:1500] Intake/Output from this shift:    Labs:  Basename 04/01/12 0440 03/31/12 0345 03/30/12 0400  WBC 13.5* 12.8* 13.8*  HGB 11.3* 11.1* 11.5*  PLT 271 246 237  LABCREA -- -- --  CREATININE 0.95 1.05 1.03   Estimated Creatinine Clearance: 85.5 ml/min (by C-G formula based on Cr of 0.95).  Basename 04/01/12 1930  VANCOTROUGH 15.8  VANCOPEAK --  VANCORANDOM --  GENTTROUGH --  GENTPEAK --  GENTRANDOM --  TOBRATROUGH --  TOBRAPEAK --  TOBRARND --  AMIKACINPEAK --  AMIKACINTROU --  AMIKACIN --    Assessment:  71 YO diabetic male with L 2nd necrotic toe/osteo and L foot cellulitis s/p 2nd toe amputation 4/12. Today is Day #4 of Vanc and Zosyn.  Blood cultures pending, L2nd toe tissue culture with moderate staph aureus, pending sensitivity.  Afebrile, WBC 13.5, stable renal function.   Vanc Trough 15.8 today (goal 15-20)  Goal of Therapy:  Vancomycin trough level 15-20 mcg/ml  Plan:   Continue Vancomycin at 1250 mg IV q12h and Zosyn at 3.375 gm IV q8h.  Pharmacy will f/u daily.  Geoffry Paradise Thi 04/01/2012,8:26 PM

## 2012-04-01 NOTE — Progress Notes (Signed)
Subjective: Patient c/o pain in foot. Patient c/o chills.  Objective: Vital signs in last 24 hours: Filed Vitals:   03/31/12 1323 03/31/12 2207 04/01/12 0439 04/01/12 1511  BP: 138/77 131/87 150/83 132/79  Pulse: 76 77 78 74  Temp: 98.4 F (36.9 C) 98.7 F (37.1 C) 99 F (37.2 C) 97.7 F (36.5 C)  TempSrc: Oral Oral Oral Oral  Resp: 20 18 16 18   Height:      Weight:      SpO2: 96% 95% 94% 99%    Intake/Output Summary (Last 24 hours) at 04/01/12 1720 Last data filed at 04/01/12 1500  Gross per 24 hour  Intake    720 ml  Output   1650 ml  Net   -930 ml    Weight change:   General: Alert, awake, oriented x3, in no acute distress. HEENT: No bruits, no goiter. Heart: Regular rate and rhythm, without murmurs, rubs, gallops. Lungs: Clear to auscultation bilaterally. Abdomen: Soft, nontender, nondistended, positive bowel sounds. Extremities: No clubbing cyanosis . Gangrenous left 2nd toe.S/P AMPUTATION WITH BANDAGE ON FOOT.  Neuro: Grossly intact, nonfocal.    Lab Results:  Basename 04/01/12 0440 03/31/12 0345  NA 132* 130*  K 3.9 4.0  CL 99 99  CO2 21 20  GLUCOSE 211* 255*  BUN 13 13  CREATININE 0.95 1.05  CALCIUM 8.5 8.6  MG -- --  PHOS -- --    Basename 03/30/12 0400  AST 15  ALT 9  ALKPHOS 68  BILITOT 0.4  PROT 6.7  ALBUMIN 2.7*   No results found for this basename: LIPASE:2,AMYLASE:2 in the last 72 hours  Basename 04/01/12 0440 03/31/12 0345  WBC 13.5* 12.8*  NEUTROABS 9.9* 9.6*  HGB 11.3* 11.1*  HCT 34.6* 33.7*  MCV 85.9 86.6  PLT 271 246   No results found for this basename: CKTOTAL:3,CKMB:3,CKMBINDEX:3,TROPONINI:3 in the last 72 hours No components found with this basename: POCBNP:3 No results found for this basename: DDIMER:2 in the last 72 hours No results found for this basename: HGBA1C:2 in the last 72 hours No results found for this basename: CHOL:2,HDL:2,LDLCALC:2,TRIG:2,CHOLHDL:2,LDLDIRECT:2 in the last 72 hours No results found  for this basename: TSH,T4TOTAL,FREET3,T3FREE,THYROIDAB in the last 72 hours No results found for this basename: VITAMINB12:2,FOLATE:2,FERRITIN:2,TIBC:2,IRON:2,RETICCTPCT:2 in the last 72 hours  Micro Results: Recent Results (from the past 240 hour(s))  CULTURE, BLOOD (ROUTINE X 2)     Status: Normal (Preliminary result)   Collection Time   03/29/12 10:30 AM      Component Value Range Status Comment   Specimen Description BLOOD LEFT HAND   Final    Special Requests BOTTLES DRAWN AEROBIC ONLY   Final    Culture  Setup Time [id number]   Final    Culture     Final    Value:        BLOOD CULTURE RECEIVED NO GROWTH TO DATE CULTURE WILL BE HELD FOR 5 DAYS BEFORE ISSUING A FINAL NEGATIVE REPORT   Report Status PENDING   Incomplete   CULTURE, BLOOD (ROUTINE X 2)     Status: Normal (Preliminary result)   Collection Time   03/29/12 11:09 AM      Component Value Range Status Comment   Specimen Description BLOOD RIGHT WRIST   Final    Special Requests BOTTLES DRAWN AEROBIC ONLY   Final    Culture  Setup Time [id number]   Final    Culture     Final    Value:  BLOOD CULTURE RECEIVED NO GROWTH TO DATE CULTURE WILL BE HELD FOR 5 DAYS BEFORE ISSUING A FINAL NEGATIVE REPORT   Report Status PENDING   Incomplete   URINE CULTURE     Status: Normal   Collection Time   03/29/12  3:03 PM      Component Value Range Status Comment   Specimen Description URINE, CLEAN CATCH   Final    Special Requests NONE   Final    Culture  Setup Time [id number]   Final    Colony Count NO GROWTH   Final    Culture NO GROWTH   Final    Report Status 03/30/2012 FINAL   Final   TISSUE CULTURE     Status: Normal (Preliminary result)   Collection Time   03/30/12  2:17 PM      Component Value Range Status Comment   Specimen Description TOE LEFT SECOND   Final    Special Requests NONE   Final    Gram Stain     Final    Value: NO WBC SEEN     RARE SQUAMOUS EPITHELIAL CELLS PRESENT     RARE GRAM POSITIVE  COCCI IN PAIRS   Culture     Final    Value: MODERATE STAPHYLOCOCCUS AUREUS     Note: RIFAMPIN AND GENTAMICIN SHOULD NOT BE USED AS SINGLE DRUGS FOR TREATMENT OF STAPH INFECTIONS.     FEW GRAM NEGATIVE RODS   Report Status PENDING   Incomplete   ANAEROBIC CULTURE     Status: Normal (Preliminary result)   Collection Time   03/30/12  2:18 PM      Component Value Range Status Comment   Specimen Description TISSUE TOE LEFT SECOND   Final    Special Requests NONE   Final    Gram Stain     Final    Value: NO WBC SEEN     RARE SQUAMOUS EPITHELIAL CELLS PRESENT     RARE GRAM POSITIVE COCCI IN PAIRS   Culture     Final    Value: NO ANAEROBES ISOLATED; CULTURE IN PROGRESS FOR 5 DAYS   Report Status PENDING   Incomplete   AFB CULTURE WITH SMEAR     Status: Normal (Preliminary result)   Collection Time   03/30/12  2:18 PM      Component Value Range Status Comment   Specimen Description TISSUE TOE LEFT SECOND   Final    Special Requests NONE   Final    ACID FAST SMEAR NO ACID FAST BACILLI SEEN   Final    Culture     Final    Value: CULTURE WILL BE EXAMINED FOR 6 WEEKS BEFORE ISSUING A FINAL REPORT   Report Status PENDING   Incomplete     Studies/Results: No results found.  Medications:     . aspirin  325 mg Oral Daily  . atenolol  50 mg Oral Daily  . colesevelam  1,875 mg Oral BID  . vitamin B-12  500 mcg Oral BID  . docusate sodium  100 mg Oral BID  . furosemide  40 mg Oral BID  . insulin aspart  0-15 Units Subcutaneous TID WC  . insulin glargine  10 Units Subcutaneous QAC breakfast  . Mag Oxide-Vit D3-Turmeric  2 capsule Oral BID  . pantoprazole  40 mg Oral Q0600  . piperacillin-tazobactam (ZOSYN)  IV  3.375 g Intravenous Q8H  . potassium chloride SA  20 mEq Oral BID  . vancomycin  1,250 mg Intravenous Q12H  . vitamin C  500 mg Oral QID  . DISCONTD: insulin glargine  5 Units Subcutaneous BH-q7a    Assessment: Principal Problem:  *Necrotic toes Active Problems:  DIABETES  MELLITUS, TYPE II  DYSLIPIDEMIA  HYPERTENSION  CAD  CHF  CAROTID ARTERY DISEASE  Cellulitis and abscess of foot  Diabetic ulcer of toe  Leukocytosis  Hyponatremia  CVA (cerebral infarction)  Osteomyelitis of ankle or foot  Aortic stenosis   Plan: #1 left second necrotic(gangrene) toe/osteomyelitis/left foot cellulitis /diabetic foot infection/myositis  S/P 2nd toe amputation yesterday. Patient did have a diabetic toe also which has worsened into necrotic toes with osteomyelitis and left foot cellulitis. MRI with myositis, cellulitis and no osteomyelitis in remaining phalanges.  Patient with chills. Tissue gram stain prelim with staph . Blood cultures are pending. Will repeat blood cx, UA, CXR as patient with chills. Continue IV vancomycin and zosyn. Ortho and ID ff and appreciate input and rxcs. PT OT. Diabetes management.  #2 leukocytosis  Likely secondary to problem #1. WBC trending down. Chest x-ray is negative. UA negative. Blood cultures are pending. Patient with chills. Will panculture.Continue empiric IV vancomycin and zosyn and follow.  #3 hyponatremia  Slowly improving.   #4 hypertension  Stable. Continue home regimen.  #5 coronary artery disease/CHF/status post CABG/mild-to-moderate aortic stenosis  Stable. Patient is currently compensated. Continue home regimen of aspirin, atenolol, Lasix. EKG is normal sinus rhythm. Will follow.  #6 history of CVA  Stable. Continue aspirin and risk factor modification. PT.  #7 type 2 diabetes   hemoglobin A1c = 9.6. CBG 208-254. Will hold oral hypoglycemics. We'll increase lantus to 10 UNITS. Continue sliding scale insulin.  #8 prophylaxis  PPI for GI prophylaxis. Lovenox for DVT prophylaxis.  Anthony Holder  03/29/2012, 1:34 PM     LOS: 3 days   Anthony Holder 04/01/2012, 5:20 PM

## 2012-04-01 NOTE — Progress Notes (Signed)
Subjective: Procedure(s) (LRB): AMPUTATION DIGIT (Left) 2 Days Post-Op  Patient reports pain as mild.   Positive void Negative bowel movement Positive flatus Negative chest pain or shortness of breath  Objective: Vital signs in last 24 hours: Temp:  [98.4 F (36.9 C)-99 F (37.2 C)] 99 F (37.2 C) (04/13 0439) Pulse Rate:  [76-78] 78  (04/13 0439) Resp:  [16-20] 16  (04/13 0439) BP: (131-150)/(77-87) 150/83 mmHg (04/13 0439) SpO2:  [94 %-96 %] 94 % (04/13 0439)  Intake/Output from previous day: 04/12 0701 - 04/13 0700 In: 480 [P.O.:480] Out: 1500 [Urine:1500]   Basename 04/01/12 0440 03/31/12 0345  WBC 13.5* 12.8*  RBC 4.03* 3.89*  HCT 34.6* 33.7*  PLT 271 246    Basename 04/01/12 0440 03/31/12 0345  NA 132* 130*  K 3.9 4.0  CL 99 99  CO2 21 20  BUN 13 13  CREATININE 0.95 1.05  GLUCOSE 211* 255*  CALCIUM 8.5 8.6    Basename 03/29/12 1030  LABPT --  INR 1.21    Incision: dressing C/D/I Compartment soft  Assessment/Plan: Patient stable  Continue mobilization with physical therapy and hard sole shoe Continue care - await Dr. Victorino Dike Monday Dr. Shon Baton spoke to and evaluated the patient today and agrees with the plan   Anthony Holder 04/01/2012, 10:07 AM
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
## 2012-04-01 NOTE — Progress Notes (Signed)
Physical Therapy Treatment Patient Details Name: Anthony Holder MRN: [id number] DOB: Aug 16, 1941 Today's Date: 04/01/2012  PT Assessment/Plan  PT - Assessment/Plan Comments on Treatment Session: Patient fatigued with activity (reports CBG still in 200 range.)  Has decreased recall of safety precautions with transfers and remains at risk for falls with posterior bias in standing especially initially to use urinal.  Seems more unsteady with increased assist for mobility than on last visit, though did walk further today.  Will need 24 hour assist on d/c and HHPT. PT Plan: Discharge plan remains appropriate PT Frequency: Min 5X/week Follow Up Recommendations: Home health PT;Supervision/Assistance - 24 hour Equipment Recommended: None recommended by PT PT Goals  Acute Rehab PT Goals Pt will go Supine/Side to Sit: Independently PT Goal: Supine/Side to Sit - Progress: Progressing toward goal Pt will go Sit to Supine/Side: Independently PT Goal: Sit to Supine/Side - Progress: Progressing toward goal Pt will go Sit to Stand: with modified independence PT Goal: Sit to Stand - Progress: Progressing toward goal Pt will go Stand to Sit: with modified independence PT Goal: Stand to Sit - Progress: Progressing toward goal Pt will Ambulate: >150 feet;with modified independence;with least restrictive assistive device PT Goal: Ambulate - Progress: Progressing toward goal Pt will Go Up / Down Stairs: 1-2 stairs;with min assist;with rolling walker PT Goal: Up/Down Stairs - Progress: Progressing toward goal  PT Treatment Precautions/Restrictions  Precautions Precautions: Fall Required Braces or Orthoses: Other Brace/Splint Other Brace/Splint: hard bottom shoe left foot Restrictions Weight Bearing Restrictions: No LLE Weight Bearing: Weight bearing as tolerated Other Position/Activity Restrictions: post op shoe Mobility (including Balance) Bed Mobility Supine to Sit: 4: Min assist Supine to Sit  Details (indicate cue type and reason): assist to get trunk completely upright Transfers Sit to Stand: 4: Min assist;From bed;With upper extremity assist Sit to Stand Details (indicate cue type and reason): tends to pull up on walker and needs multiple attempts Stand to Sit: 4: Min assist;With upper extremity assist;To bed Stand to Sit Details: cues for safety, to reach for bed and sit with controlled descent Ambulation/Gait Ambulation/Gait Assistance: 4: Min assist Ambulation/Gait Assistance Details (indicate cue type and reason): assist due to balance posterior Ambulation Distance (Feet): 200 Feet Gait Pattern: Step-through pattern;Decreased stride length Stairs: Yes Stairs Assistance: 4: Min assist Stairs Assistance Details (indicate cue type and reason): performed x 1 up one step; then x 1 up 2 steps due to patient reports had more than one step; issued handout of technique Stair Management Technique: Backwards;With walker Number of Stairs: 2  (and one)  Balance Balance Assessed: Yes Static Standing Balance Static Standing - Balance Support: No upper extremity supported Static Standing - Level of Assistance: 4: Min assist Static Standing - Comment/# of Minutes: leans posterior while standing to use urinal Exercise    End of Session PT - End of Session Equipment Utilized During Treatment: Gait belt;Other (comment) (post op shoe) Activity Tolerance: Patient limited by fatigue Patient left: in bed General Behavior During Session: [facility] for tasks performed Cognition: Impaired Cognitive Impairment: did not carryover safety instructions  Hernando Reali,CYNDI 04/01/2012, 4:49 PM

## 2012-04-02 ENCOUNTER — Encounter (HOSPITAL_COMMUNITY): Payer: Self-pay | Admitting: Internal Medicine

## 2012-04-02 LAB — TISSUE CULTURE

## 2012-04-02 LAB — GLUCOSE, CAPILLARY
Glucose-Capillary: 260 mg/dL — ABNORMAL HIGH (ref 70–99)
Glucose-Capillary: 365 mg/dL — ABNORMAL HIGH (ref 70–99)
Glucose-Capillary: 263 mg/dL — ABNORMAL HIGH (ref 70–99)

## 2012-04-02 LAB — BASIC METABOLIC PANEL
Chloride: 99 mEq/L (ref 96–112)
GFR calc Af Amer: 75 mL/min — ABNORMAL LOW (ref >90)
GFR calc non Af Amer: 64 mL/min — ABNORMAL LOW (ref >90)
Glucose, Bld: 297 mg/dL — ABNORMAL HIGH (ref 70–99)
Potassium: 3.9 mEq/L (ref 3.5–5.1)
Sodium: 133 mEq/L — ABNORMAL LOW (ref 135–145)

## 2012-04-02 LAB — DIFFERENTIAL
Basophils Absolute: 0.1 10*3/uL (ref 0.0–0.1)
Basophils Relative: 1 % (ref 0–1)
Eosinophils Absolute: 0.3 10*3/uL (ref 0.0–0.7)
Eosinophils Relative: 2 % (ref 0–5)
Lymphocytes Relative: 15 % (ref 12–46)
Monocytes Absolute: 1.8 10*3/uL — ABNORMAL HIGH (ref 0.1–1.0)

## 2012-04-02 LAB — CBC
HCT: 34.9 % — ABNORMAL LOW (ref 39.0–52.0)
Hemoglobin: 11.5 g/dL — ABNORMAL LOW (ref 13.0–17.0)
RBC: 4.01 MIL/uL — ABNORMAL LOW (ref 4.22–5.81)

## 2012-04-02 MED ORDER — FLUTICASONE PROPIONATE 50 MCG/ACT NA SUSP
2.0000 | Freq: Every day | NASAL | Status: DC
  Administered 2012-04-02 – 2012-04-04 (×3): 2 via NASAL
  Filled 2012-04-02: qty 16

## 2012-04-02 MED ORDER — POLYETHYLENE GLYCOL 3350 17 G PO PACK
17.0000 g | PACK | Freq: Every day | ORAL | Status: DC
  Administered 2012-04-02 – 2012-04-04 (×3): 17 g via ORAL
  Filled 2012-04-02 (×4): qty 1

## 2012-04-02 MED ORDER — PSEUDOEPHEDRINE HCL ER 120 MG PO TB12
120.0000 mg | ORAL_TABLET | Freq: Two times a day (BID) | ORAL | Status: DC
  Administered 2012-04-02 – 2012-04-04 (×5): 120 mg via ORAL
  Filled 2012-04-02 (×8): qty 1

## 2012-04-02 MED ORDER — INSULIN ASPART 100 UNIT/ML ~~LOC~~ SOLN
4.0000 [IU] | Freq: Once | SUBCUTANEOUS | Status: AC
  Administered 2012-04-02: 4 [IU] via SUBCUTANEOUS

## 2012-04-02 MED ORDER — LORATADINE 10 MG PO TABS
10.0000 mg | ORAL_TABLET | Freq: Every day | ORAL | Status: DC
  Administered 2012-04-02 – 2012-04-04 (×3): 10 mg via ORAL
  Filled 2012-04-02 (×4): qty 1

## 2012-04-02 NOTE — Progress Notes (Signed)
Subjective: Patient states pain in foot is controlled. Patient with fever overnight of 101. Patient states does not feel well and c/o congestion.  Objective: Vital signs in last 24 hours: Filed Vitals:   04/01/12 1715 04/01/12 2059 04/01/12 2232 04/02/12 0450  BP: 134/81 140/68  157/61  Pulse: 63 85  71  Temp: 99.5 F (37.5 C) 101 F (38.3 C) 98.6 F (37 C) 97.9 F (36.6 C)  TempSrc: Oral Oral Oral Oral  Resp: 18 20  18   Height:      Weight:      SpO2: 94% 98%  98%    Intake/Output Summary (Last 24 hours) at 04/02/12 1025 Last data filed at 04/02/12 0900  Gross per 24 hour  Intake    480 ml  Output   1700 ml  Net  -1220 ml    Weight change:   General: Alert, awake, oriented x3, in no acute distress. HEENT: No bruits, no goiter. Heart: Regular rate and rhythm, without murmurs, rubs, gallops. Lungs: Minimal exp wheezes o/w clear. Abdomen: Soft, nontender, nondistended, positive bowel sounds. Extremities: No clubbing cyanosis . Gangrenous left 2nd toe.S/P AMPUTATION WITH BANDAGE ON FOOT. 1-2+ LLE edema Neuro: Grossly intact, nonfocal.    Lab Results:  Basename 04/02/12 0442 04/01/12 0440  NA 133* 132*  K 3.9 3.9  CL 99 99  CO2 25 21  GLUCOSE 297* 211*  BUN 16 13  CREATININE 1.13 0.95  CALCIUM 8.9 8.5  MG -- --  PHOS -- --   No results found for this basename: AST:2,ALT:2,ALKPHOS:2,BILITOT:2,PROT:2,ALBUMIN:2 in the last 72 hours No results found for this basename: LIPASE:2,AMYLASE:2 in the last 72 hours  Basename 04/02/12 0442 04/01/12 0440  WBC 17.3* 13.5*  NEUTROABS 12.5* 9.9*  HGB 11.5* 11.3*  HCT 34.9* 34.6*  MCV 87.0 85.9  PLT 282 271   No results found for this basename: CKTOTAL:3,CKMB:3,CKMBINDEX:3,TROPONINI:3 in the last 72 hours No components found with this basename: POCBNP:3 No results found for this basename: DDIMER:2 in the last 72 hours No results found for this basename: HGBA1C:2 in the last 72 hours No results found for this basename:  CHOL:2,HDL:2,LDLCALC:2,TRIG:2,CHOLHDL:2,LDLDIRECT:2 in the last 72 hours No results found for this basename: TSH,T4TOTAL,FREET3,T3FREE,THYROIDAB in the last 72 hours No results found for this basename: VITAMINB12:2,FOLATE:2,FERRITIN:2,TIBC:2,IRON:2,RETICCTPCT:2 in the last 72 hours  Micro Results: Recent Results (from the past 240 hour(s))  CULTURE, BLOOD (ROUTINE X 2)     Status: Normal (Preliminary result)   Collection Time   03/29/12 10:30 AM      Component Value Range Status Comment   Specimen Description BLOOD LEFT HAND   Final    Special Requests BOTTLES DRAWN AEROBIC ONLY   Final    Culture  Setup Time [id number]   Final    Culture     Final    Value:        BLOOD CULTURE RECEIVED NO GROWTH TO DATE CULTURE WILL BE HELD FOR 5 DAYS BEFORE ISSUING A FINAL NEGATIVE REPORT   Report Status PENDING   Incomplete   CULTURE, BLOOD (ROUTINE X 2)     Status: Normal (Preliminary result)   Collection Time   03/29/12 11:09 AM      Component Value Range Status Comment   Specimen Description BLOOD RIGHT WRIST   Final    Special Requests BOTTLES DRAWN AEROBIC ONLY   Final    Culture  Setup Time [id number]   Final    Culture     Final  Value:        BLOOD CULTURE RECEIVED NO GROWTH TO DATE CULTURE WILL BE HELD FOR 5 DAYS BEFORE ISSUING A FINAL NEGATIVE REPORT   Report Status PENDING   Incomplete   URINE CULTURE     Status: Normal   Collection Time   03/29/12  3:03 PM      Component Value Range Status Comment   Specimen Description URINE, CLEAN CATCH   Final    Special Requests NONE   Final    Culture  Setup Time [id number]   Final    Colony Count NO GROWTH   Final    Culture NO GROWTH   Final    Report Status 03/30/2012 FINAL   Final   TISSUE CULTURE     Status: Normal   Collection Time   03/30/12  2:17 PM      Component Value Range Status Comment   Specimen Description TOE LEFT SECOND   Final    Special Requests NONE   Final    Gram Stain     Final    Value: NO WBC SEEN      RARE SQUAMOUS EPITHELIAL CELLS PRESENT     RARE GRAM POSITIVE COCCI IN PAIRS   Culture     Final    Value: MODERATE STAPHYLOCOCCUS AUREUS     Note: RIFAMPIN AND GENTAMICIN SHOULD NOT BE USED AS SINGLE DRUGS FOR TREATMENT OF STAPH INFECTIONS. This organism DOES NOT demonstrate inducible Clindamycin resistance in vitro.     FEW KLEBSIELLA PNEUMONIAE   Report Status 04/02/2012 FINAL   Final    Organism ID, Bacteria STAPHYLOCOCCUS AUREUS   Final    Organism ID, Bacteria KLEBSIELLA PNEUMONIAE   Final   ANAEROBIC CULTURE     Status: Normal (Preliminary result)   Collection Time   03/30/12  2:18 PM      Component Value Range Status Comment   Specimen Description TISSUE TOE LEFT SECOND   Final    Special Requests NONE   Final    Gram Stain     Final    Value: NO WBC SEEN     RARE SQUAMOUS EPITHELIAL CELLS PRESENT     RARE GRAM POSITIVE COCCI IN PAIRS   Culture     Final    Value: NO ANAEROBES ISOLATED; CULTURE IN PROGRESS FOR 5 DAYS   Report Status PENDING   Incomplete   AFB CULTURE WITH SMEAR     Status: Normal (Preliminary result)   Collection Time   03/30/12  2:18 PM      Component Value Range Status Comment   Specimen Description TISSUE TOE LEFT SECOND   Final    Special Requests NONE   Final    ACID FAST SMEAR NO ACID FAST BACILLI SEEN   Final    Culture     Final    Value: CULTURE WILL BE EXAMINED FOR 6 WEEKS BEFORE ISSUING A FINAL REPORT   Report Status PENDING   Incomplete     Studies/Results: Dg Chest 2 View  04/01/2012  *RADIOLOGY REPORT*  Clinical Data: Pneumonia.  CHEST - 2 VIEW  Comparison: Two-view chest 03/29/2012.  Findings: The heart size is normal.  The lungs are clear.  Chronic bronchitic changes stable.  No focal airspace disease is evident. The visualized soft tissues and bony thorax are unremarkable.  IMPRESSION:  1.  Chronic bronchitic change. 2.  No definite focal airspace disease. 3.  Low lung volumes.  Original Report Authenticated By: Jamesetta Orleans.  MATTERN,  M.D.    Medications:     . aspirin  325 mg Oral Daily  . atenolol  50 mg Oral Daily  . colesevelam  1,875 mg Oral BID  . vitamin B-12  500 mcg Oral BID  . docusate sodium  100 mg Oral BID  . furosemide  40 mg Oral BID  . insulin aspart  0-15 Units Subcutaneous TID WC  . insulin glargine  10 Units Subcutaneous QAC breakfast  . Mag Oxide-Vit D3-Turmeric  2 capsule Oral BID  . pantoprazole  40 mg Oral Q0600  . piperacillin-tazobactam (ZOSYN)  IV  3.375 g Intravenous Q8H  . potassium chloride SA  20 mEq Oral BID  . vancomycin  1,250 mg Intravenous Q12H  . vitamin C  500 mg Oral QID  . 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 Patient did have a diabetic toe also which has worsened into necrotic toes with osteomyelitis and left foot cellulitis. MRI with myositis, cellulitis and no osteomyelitis in remaining phalanges.  Patient with chills and fever overnight. Increasing WBC. Tissue culture with staph aureus and klebsiella pneum sensitive to Vancomycin and Zosyn . Blood cultures are pending.  UA negative, CXR negative. Continue IV vancomycin and zosyn. Ortho and ID ff and appreciate input and rxcs. PT OT. Diabetes management.  #2 leukocytosis  Likely secondary to problem #1. WBC trending up today. Chest x-ray is negative. UA negative. Blood cultures are pending. Patient with fever overnight.Continue empiric IV vancomycin and zosyn and follow. #3  Fever /?etiology. Likely secondary to #1. Tissue culture with Klebsiella pneum and staph aureus sensitive to vanc and zosyn. CXR negative, UA negative, repeat blood cultures pending. WBC rising. Continue empiric IV vancomycin and Zosyn. IF ff. #4 hyponatremia  Slowly improving.   #5 hypertension  Stable. Continue home regimen.  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 Patient is currently compensated. Continue home regimen of aspirin, atenolol, Lasix. EKG is normal sinus rhythm. Will follow.  #7 history of CVA  Stable. Continue aspirin and risk factor modification. PT.  #8 type 2 diabetes   hemoglobin A1c = 9.6. CBG 197-260. Oral hypoglycemics on hold. Continue lantus  10 UNITS. Continue sliding scale insulin.  #8 prophylaxis  PPI for GI prophylaxis. Lovenox for DVT prophylaxis.  Zahniya Zellars  03/29/2012, 1:34 PM     LOS: 4 days   Elton Heid 04/02/2012, 10:25 AM

## 2012-04-02 NOTE — Progress Notes (Signed)
2232: [name] decreased to 98.6. Will continue to monitor. Anthony Holder [facility] 04/02/2012 3:57 AM

## 2012-04-03 DIAGNOSIS — M869 Osteomyelitis, unspecified: Secondary | ICD-10-CM

## 2012-04-03 LAB — BASIC METABOLIC PANEL
Chloride: 97 mEq/L (ref 96–112)
GFR calc Af Amer: 84 mL/min — ABNORMAL LOW (ref >90)
GFR calc non Af Amer: 72 mL/min — ABNORMAL LOW (ref >90)
Potassium: 3.8 mEq/L (ref 3.5–5.1)

## 2012-04-03 LAB — DIFFERENTIAL
Basophils Absolute: 0.1 10*3/uL (ref 0.0–0.1)
Basophils Relative: 1 % (ref 0–1)
Eosinophils Absolute: 0.3 10*3/uL (ref 0.0–0.7)
Eosinophils Relative: 2 % (ref 0–5)
Lymphocytes Relative: 12 % (ref 12–46)
Monocytes Absolute: 1.2 10*3/uL — ABNORMAL HIGH (ref 0.1–1.0)

## 2012-04-03 LAB — GLUCOSE, CAPILLARY
Glucose-Capillary: 267 mg/dL — ABNORMAL HIGH (ref 70–99)
Glucose-Capillary: 243 mg/dL — ABNORMAL HIGH (ref 70–99)

## 2012-04-03 LAB — CBC
MCHC: 32.9 g/dL (ref 30.0–36.0)
Platelets: 309 10*3/uL (ref 150–400)
RDW: 13 % (ref 11.5–15.5)
WBC: 15.4 10*3/uL — ABNORMAL HIGH (ref 4.0–10.5)

## 2012-04-03 LAB — URINE CULTURE
Colony Count: NO GROWTH
Culture: NO GROWTH

## 2012-04-03 MED ORDER — SODIUM CHLORIDE 0.9 % IJ SOLN: 10.0000 mL | INTRAMUSCULAR | Status: DC | PRN

## 2012-04-03 MED ORDER — SODIUM CHLORIDE 0.9 % IJ SOLN
10.0000 mL | Freq: Two times a day (BID) | INTRAMUSCULAR | Status: DC
  Administered 2012-04-03: 10 mL

## 2012-04-03 MED ORDER — INSULIN GLARGINE 100 UNIT/ML ~~LOC~~ SOLN
15.0000 [IU] | Freq: Every day | SUBCUTANEOUS | Status: DC
  Administered 2012-04-04: 15 [IU] via SUBCUTANEOUS

## 2012-04-03 NOTE — Progress Notes (Signed)
Inpatient Diabetes Program  CBGs still remain high. Started meal coverage insulin yesterday. Pt reluctant to go home on insulin.  States his blood sugars "are good in the morning at home."   Results for Anthony Holder, Anthony Holder (MRN [id number]) as of 04/03/2012 16:31  Ref. Range 04/02/2012 07:43 04/02/2012 11:29 04/02/2012 16:05 04/02/2012 20:47 04/03/2012 07:35  Glucose-Capillary Latest Range: 70-99 mg/dL 086 (H) 578 (H) 469 (H) 338 (H) 267 (H)   Increase meal coverage insulin to Novolog 5 units tidwc.  Just started on Lantus yesterday.  FBS remains elevated today.  Will probably need to adjust.    Will continue to follow.

## 2012-04-03 NOTE — Progress Notes (Signed)
Physical Therapy Treatment Patient Details Name: Anthony Holder MRN: [id number] DOB: Mar 20, 1941 Today's Date: 04/03/2012  PT Assessment/Plan  PT - Assessment/Plan Comments on Treatment Session: pt continues to have problems with blood sugar management and expresses frustration with this.  His mobility and gait is improved today PT Plan: Discharge plan remains appropriate PT Frequency: Min 5X/week Recommendations for Other Services: OT consult Follow Up Recommendations: Home health PT;Supervision/Assistance - 24 hour Equipment Recommended: None recommended by PT;Other (comment) (pt stated no one has gotten his walker from storage yet) PT Goals  Acute Rehab PT Goals Time For Goal Achievement: 2 weeks PT Goal: Sit to Stand - Progress: Met PT Goal: Stand to Sit - Progress: Met PT Goal: Ambulate - Progress: Progressing toward goal  PT Treatment Precautions/Restrictions  Precautions Precautions: Fall Precaution Comments: pt having problems with blood sugar control during admission Required Braces or Orthoses: Other Brace/Splint Other Brace/Splint: hard bottom shoe left foot Restrictions Weight Bearing Restrictions: No LLE Weight Bearing: Weight bearing as tolerated Other Position/Activity Restrictions: post op shoe Mobility (including Balance) Bed Mobility Supine to Sit: 4: Min assist Transfers Sit to Stand: With upper extremity assist;5: Supervision;With armrests;From chair/3-in-1 Sit to Stand Details (indicate cue type and reason): pt improving in pushing up with armrests Stand to Sit: With upper extremity assist;5: Supervision;To chair/3-in-1 Ambulation/Gait Ambulation/Gait: Yes Ambulation/Gait Assistance: 5: Supervision Ambulation/Gait Assistance Details (indicate cue type and reason):  intermittent cues for posture, improved use of RW Ambulation Distance (Feet): 250 Feet Assistive device: Rolling walker Gait Pattern: Step-through pattern;Decreased stride length Stairs:  No Stairs Assistance: 4: Min assist Stairs Assistance Details (indicate cue type and reason): pt reports 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: No  Posture/Postural Control Posture/Postural Control: No significant limitations Static Standing Balance Static Standing - Balance Support: No upper extremity supported Static Standing - Level of Assistance: 7: Independent Static Standing - Comment/# of Minutes: 2 Exercise  General Exercises - Lower Extremity Gluteal Sets: AROM;Both;5 reps;Standing Hip Flexion/Marching: AROM;Both;10 reps;Seated End of Session PT - End of Session Equipment Utilized During Treatment: Gait belt Activity Tolerance: Patient limited by pain;Patient limited by fatigue Patient left: in chair Nurse Communication: Mobility status for transfers General Behavior During Session: [facility] for tasks performed Cognition: [facility] for tasks performed  Donnetta Hail 04/03/2012, 11:13 AM

## 2012-04-03 NOTE — Progress Notes (Signed)
Subjective: Patient states pain in foot is controlled. Patient afebrile. Patient wants to go home.  Objective: Vital signs in last 24 hours: Filed Vitals:   04/02/12 1424 04/02/12 2050 04/03/12 0343 04/03/12 1453  BP: 117/72 138/66 137/63 122/75  Pulse: 72 86 83 88  Temp: 97.9 F (36.6 C) 98.8 F (37.1 C) 98.7 F (37.1 C) 97.8 F (36.6 C)  TempSrc: Oral Oral Oral Oral  Resp: 18 18 16 18   Height:      Weight:      SpO2: 98% 100% 98% 99%    Intake/Output Summary (Last 24 hours) at 04/03/12 1745 Last data filed at 04/03/12 1428  Gross per 24 hour  Intake    480 ml  Output   1925 ml  Net  -1445 ml    Weight change:   General: Alert, awake, oriented x3, in no acute distress. HEENT: No bruits, no goiter. Heart: Regular rate and rhythm, without murmurs, rubs, gallops. Lungs: CTAB Abdomen: Soft, nontender, nondistended, positive bowel sounds. Extremities: No clubbing cyanosis . Gangrenous left 2nd toe.S/P AMPUTATION WITH BANDAGE ON FOOT. 1-2+ LLE edema Neuro: Grossly intact, nonfocal.    Lab Results:  Basename 04/03/12 0340 04/02/12 0442  NA 130* 133*  K 3.8 3.9  CL 97 99  CO2 23 25  GLUCOSE 255* 297*  BUN 14 16  CREATININE 1.03 1.13  CALCIUM 8.5 8.9  MG -- --  PHOS -- --   No results found for this basename: AST:2,ALT:2,ALKPHOS:2,BILITOT:2,PROT:2,ALBUMIN:2 in the last 72 hours No results found for this basename: LIPASE:2,AMYLASE:2 in the last 72 hours  Basename 04/03/12 0340 04/02/12 0442  WBC 15.4* 17.3*  NEUTROABS 11.9* 12.5*  HGB 10.8* 11.5*  HCT 32.8* 34.9*  MCV 85.0 87.0  PLT 309 282   No results found for this basename: CKTOTAL:3,CKMB:3,CKMBINDEX:3,TROPONINI:3 in the last 72 hours No components found with this basename: POCBNP:3 No results found for this basename: DDIMER:2 in the last 72 hours No results found for this basename: HGBA1C:2 in the last 72 hours No results found for this basename: CHOL:2,HDL:2,LDLCALC:2,TRIG:2,CHOLHDL:2,LDLDIRECT:2 in  the last 72 hours No results found for this basename: TSH,T4TOTAL,FREET3,T3FREE,THYROIDAB in the last 72 hours No results found for this basename: VITAMINB12:2,FOLATE:2,FERRITIN:2,TIBC:2,IRON:2,RETICCTPCT:2 in the last 72 hours  Micro Results: Recent Results (from the past 240 hour(s))  CULTURE, BLOOD (ROUTINE X 2)     Status: Normal (Preliminary result)   Collection Time   03/29/12 10:30 AM      Component Value Range Status Comment   Specimen Description BLOOD LEFT HAND   Final    Special Requests BOTTLES DRAWN AEROBIC ONLY   Final    Culture  Setup Time [id number]   Final    Culture     Final    Value:        BLOOD CULTURE RECEIVED NO GROWTH TO DATE CULTURE WILL BE HELD FOR 5 DAYS BEFORE ISSUING A FINAL NEGATIVE REPORT   Report Status PENDING   Incomplete   CULTURE, BLOOD (ROUTINE X 2)     Status: Normal (Preliminary result)   Collection Time   03/29/12 11:09 AM      Component Value Range Status Comment   Specimen Description BLOOD RIGHT WRIST   Final    Special Requests BOTTLES DRAWN AEROBIC ONLY   Final    Culture  Setup Time [id number]   Final    Culture     Final    Value:        BLOOD CULTURE RECEIVED  NO GROWTH TO DATE CULTURE WILL BE HELD FOR 5 DAYS BEFORE ISSUING A FINAL NEGATIVE REPORT   Report Status PENDING   Incomplete   URINE CULTURE     Status: Normal   Collection Time   03/29/12  3:03 PM      Component Value Range Status Comment   Specimen Description URINE, CLEAN CATCH   Final    Special Requests NONE   Final    Culture  Setup Time [id number]   Final    Colony Count NO GROWTH   Final    Culture NO GROWTH   Final    Report Status 03/30/2012 FINAL   Final   TISSUE CULTURE     Status: Normal   Collection Time   03/30/12  2:17 PM      Component Value Range Status Comment   Specimen Description TOE LEFT SECOND   Final    Special Requests NONE   Final    Gram Stain     Final    Value: NO WBC SEEN     RARE SQUAMOUS EPITHELIAL CELLS PRESENT     RARE  GRAM POSITIVE COCCI IN PAIRS   Culture     Final    Value: MODERATE STAPHYLOCOCCUS AUREUS     Note: RIFAMPIN AND GENTAMICIN SHOULD NOT BE USED AS SINGLE DRUGS FOR TREATMENT OF STAPH INFECTIONS. This organism DOES NOT demonstrate inducible Clindamycin resistance in vitro.     FEW KLEBSIELLA PNEUMONIAE   Report Status 04/02/2012 FINAL   Final    Organism ID, Bacteria STAPHYLOCOCCUS AUREUS   Final    Organism ID, Bacteria KLEBSIELLA PNEUMONIAE   Final   ANAEROBIC CULTURE     Status: Normal (Preliminary result)   Collection Time   03/30/12  2:18 PM      Component Value Range Status Comment   Specimen Description TISSUE TOE LEFT SECOND   Final    Special Requests NONE   Final    Gram Stain     Final    Value: NO WBC SEEN     RARE SQUAMOUS EPITHELIAL CELLS PRESENT     RARE GRAM POSITIVE COCCI IN PAIRS   Culture     Final    Value: NO ANAEROBES ISOLATED; CULTURE IN PROGRESS FOR 5 DAYS   Report Status PENDING   Incomplete   AFB CULTURE WITH SMEAR     Status: Normal (Preliminary result)   Collection Time   03/30/12  2:18 PM      Component Value Range Status Comment   Specimen Description TISSUE TOE LEFT SECOND   Final    Special Requests NONE   Final    ACID FAST SMEAR NO ACID FAST BACILLI SEEN   Final    Culture     Final    Value: CULTURE WILL BE EXAMINED FOR 6 WEEKS BEFORE ISSUING A FINAL REPORT   Report Status PENDING   Incomplete   CULTURE, BLOOD (ROUTINE X 2)     Status: Normal (Preliminary result)   Collection Time   04/01/12  5:55 PM      Component Value Range Status Comment   Specimen Description BLOOD LEFT ARM   Final    Special Requests BOTTLES DRAWN AEROBIC ONLY    Final    Culture  Setup Time [id number]   Final    Culture     Final    Value:        BLOOD CULTURE RECEIVED NO GROWTH TO DATE CULTURE WILL  BE HELD FOR 5 DAYS BEFORE ISSUING A FINAL NEGATIVE REPORT   Report Status PENDING   Incomplete   CULTURE, BLOOD (ROUTINE X 2)     Status: Normal (Preliminary result)    Collection Time   04/01/12  6:05 PM      Component Value Range Status Comment   Specimen Description Blood   Final    Special Requests NONE   Final    Culture  Setup Time [id number]   Final    Culture     Final    Value:        BLOOD CULTURE RECEIVED NO GROWTH TO DATE CULTURE WILL BE HELD FOR 5 DAYS BEFORE ISSUING A FINAL NEGATIVE REPORT   Report Status PENDING   Incomplete   URINE CULTURE     Status: Normal   Collection Time   04/01/12 10:29 PM      Component Value Range Status Comment   Specimen Description URINE, RANDOM   Final    Special Requests NONE   Final    Culture  Setup Time [id number]   Final    Colony Count NO GROWTH   Final    Culture NO GROWTH   Final    Report Status 04/03/2012 FINAL   Final     Studies/Results: Dg Chest 2 View  04/01/2012  *RADIOLOGY REPORT*  Clinical Data: Pneumonia.  CHEST - 2 VIEW  Comparison: Two-view chest 03/29/2012.  Findings: The heart size is normal.  The lungs are clear.  Chronic bronchitic changes stable.  No focal airspace disease is evident. The visualized soft tissues and bony thorax are unremarkable.  IMPRESSION:  1.  Chronic bronchitic change. 2.  No definite focal airspace disease. 3.  Low lung volumes.  Original Report Authenticated By: Jamesetta Orleans. MATTERN, M.D.    Medications:     . aspirin  325 mg Oral Daily  . atenolol  50 mg Oral Daily  . colesevelam  1,875 mg Oral BID  . vitamin B-12  500 mcg Oral BID  . docusate sodium  100 mg Oral BID  . fluticasone  2 spray Each Nare Daily  . furosemide  40 mg Oral BID  . insulin aspart  0-15 Units Subcutaneous TID WC  . insulin aspart  4 Units Subcutaneous Once  . insulin glargine  10 Units Subcutaneous QAC breakfast  . loratadine  10 mg Oral Daily  . Mag Oxide-Vit D3-Turmeric  2 capsule Oral BID  . pantoprazole  40 mg Oral Q0600  . piperacillin-tazobactam (ZOSYN)  IV  3.375 g Intravenous Q8H  . polyethylene glycol  17 g Oral Daily  . potassium chloride SA  20 mEq Oral BID    . pseudoephedrine  120 mg Oral BID  . vitamin C  500 mg Oral QID  . 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 Patient did have a diabetic toe also which has worsened into necrotic toes with osteomyelitis and left foot cellulitis. MRI with myositis, cellulitis and no osteomyelitis  in remaining phalanges.  Patient afebrile. WBC trending back down. Tissue culture with staph aureus and klebsiella pneum sensitive to Vancomycin and Zosyn . Blood cultures are pending.  UA negative, CXR negative. Antibiotics tailored down to zosyn. Per ID patient will need 2 weeks of IV antibiotics postop. PICC line pending. Ortho and ID ff and appreciate input and rxcs. PT OT. Diabetes management.  #2 leukocytosis  Likely secondary to problem #1. WBC trending back down today. Chest x-ray is negative. UA negative. Blood cultures are pending. Patient afebrile.Continue empiric IV antibiotics and follow. #3 Fever /?etiology. Likely secondary to #1. Tissue culture with Klebsiella pneum and staph aureus sensitive to vanc and zosyn. CXR negative, UA negative, repeat blood cultures pending. WBC rising. Continue empiric IV Zosyn.  #4 hyponatremia  Slowly improving.   #5 hypertension  Stable. Continue home regimen.  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 Patient is currently compensated. Continue home regimen of aspirin, atenolol, Lasix. EKG  is normal sinus rhythm. Will follow.  #7 history of CVA  Stable. Continue aspirin and risk factor modification. PT.  #8 type 2 diabetes   hemoglobin A1c = 9.6. CBG [id number]. Oral hypoglycemics on hold. Increase lantus 15 UNITS. Continue sliding scale insulin.  #8 prophylaxis  PPI for GI prophylaxis. Lovenox for DVT prophylaxis.  Anthony Holder  03/29/2012, 1:34 PM     LOS: 5 days   Anthony Holder 04/03/2012, 5:45 PM

## 2012-04-03 NOTE — Progress Notes (Signed)
OT Note:  Pt worked with PT earlier and was at a supervision level.  He doesn't believe he will need OT.  We will try to check back one more time to make sure that he is OK with shower transfer, if he is willing to do this.  [place], OTR/L [phone] 04/03/2012

## 2012-04-03 NOTE — Progress Notes (Signed)
Subjective: 4 Days Post-Op Procedure(s) (LRB): AMPUTATION DIGIT (Left) Patient reports pain as mild.  No n/v/f/c.  Objective: Vital signs in last 24 hours: Temp:  [97.9 F (36.6 C)-98.8 F (37.1 C)] 98.7 F (37.1 C) (04/15 0343) Pulse Rate:  [72-86] 83  (04/15 0343) Resp:  [16-18] 16  (04/15 0343) BP: (117-138)/(63-72) 137/63 mmHg (04/15 0343) SpO2:  [98 %-100 %] 98 % (04/15 0343)  Intake/Output from previous day: 04/14 0701 - 04/15 0700 In: 720 [P.O.:720] Out: 1700 [Urine:1700] Intake/Output this shift: Total I/O In: 240 [P.O.:240] Out: 1125 [Urine:1125]   Basename 04/03/12 0340 04/02/12 0442 04/01/12 0440  HGB 10.8* 11.5* 11.3*    Basename 04/03/12 0340 04/02/12 0442  WBC 15.4* 17.3*  RBC 3.86* 4.01*  HCT 32.8* 34.9*  PLT 309 282    Basename 04/03/12 0340 04/02/12 0442  NA 130* 133*  K 3.8 3.9  CL 97 99  CO2 23 25  BUN 14 16  CREATININE 1.03 1.13  GLUCOSE 255* 297*  CALCIUM 8.5 8.9   No results found for this basename: LABPT:2,INR:2 in the last 72 hours  wound is healing with scant necrosis around wound edges but no erythema, drainage.  Scant serous fluid can be expressed from teh incision, but no purulence.  Assessment/Plan: 4 Days Post-Op Procedure(s) (LRB): AMPUTATION DIGIT (Left) Dressing changed today.  I don't believe his foot wound looks infected today.    Final abx recs per ID.  Pt can be discharged to home at any time from ortho perspective.  I will see him back in clinic in 2 weeks.  Pt should f/u with wound center for further treatment of this wound since it will likely be slow to heal.  No indication for anticoagulation post op for this ambulatory patient.  i'll sign off now.  Please page me at [phone] if you have any qyestions.  Toni Arthurs 04/03/2012, 1:47 PM

## 2012-04-03 NOTE — Progress Notes (Signed)
INFECTIOUS DISEASE PROGRESS NOTE  ID: Anthony Holder is a 71 y.o. male with  Principal Problem:  *Diabetic infection of left foot Active Problems:  DIABETES MELLITUS, TYPE II  DYSLIPIDEMIA  HYPERTENSION  CAD  CHF  CAROTID ARTERY DISEASE  Necrotic toes  Cellulitis and abscess of foot  Diabetic ulcer of toe  Leukocytosis  Hyponatremia  CVA (cerebral infarction)  Osteomyelitis of ankle or foot  Aortic stenosis  Fever  Gangrene of toe  Subjective: Without complaint. No diarrhea.   Abtx:  Anti-infectives     Start     Dose/Rate Route Frequency Ordered Stop   03/30/12 0000  piperacillin-tazobactam (ZOSYN) IVPB 3.375 g       3.375 g 12.5 mL/hr over 240 Minutes Intravenous Every 8 hours 03/29/12 1946     03/29/12 2200   vancomycin (VANCOCIN) 1,250 mg in sodium chloride 0.9 % 250 mL IVPB        1,250 mg 166.7 mL/hr over 90 Minutes Intravenous Every 12 hours 03/29/12 1520     03/29/12 1445   ciprofloxacin (CIPRO) IVPB 400 mg  Status:  Discontinued        400 mg 200 mL/hr over 60 Minutes Intravenous Every 12 hours 03/29/12 1433 03/29/12 1944   03/29/12 1130  piperacillin-tazobactam (ZOSYN) IVPB 3.375 g       3.375 g 12.5 mL/hr over 240 Minutes Intravenous  Once 03/29/12 1055 03/29/12 1939   03/29/12 1100   vancomycin (VANCOCIN) IVPB 1000 mg/200 mL premix        1,000 mg 200 mL/hr over 60 Minutes Intravenous  Once 03/29/12 1026 03/29/12 1254          Medications:  Scheduled:   . aspirin  325 mg Oral Daily  . atenolol  50 mg Oral Daily  . colesevelam  1,875 mg Oral BID  . vitamin B-12  500 mcg Oral BID  . docusate sodium  100 mg Oral BID  . fluticasone  2 spray Each Nare Daily  . furosemide  40 mg Oral BID  . insulin aspart  0-15 Units Subcutaneous TID WC  . insulin aspart  4 Units Subcutaneous Once  . insulin glargine  10 Units Subcutaneous QAC breakfast  . loratadine  10 mg Oral Daily  . Mag Oxide-Vit D3-Turmeric  2 capsule Oral BID  . pantoprazole  40 mg  Oral Q0600  . piperacillin-tazobactam (ZOSYN)  IV  3.375 g Intravenous Q8H  . polyethylene glycol  17 g Oral Daily  . potassium chloride SA  20 mEq Oral BID  . pseudoephedrine  120 mg Oral BID  . vancomycin  1,250 mg Intravenous Q12H  . vitamin C  500 mg Oral QID    Objective: Vital signs in last 24 hours: Temp:  [97.8 F (36.6 C)-98.8 F (37.1 C)] 97.8 F (36.6 C) (04/15 1453) Pulse Rate:  [83-88] 88  (04/15 1453) Resp:  [16-18] 18  (04/15 1453) BP: (122-138)/(63-75) 122/75 mmHg (04/15 1453) SpO2:  [98 %-100 %] 99 % (04/15 1453)   General appearance: alert, appears stated age and no distress Incision/Wound: wound is clean, no d/c. There is a area that is roughly "dime" size proximal to the wound. Skin necrosis? The erythema in his foot is decreased vs previous.  No cordis felt in his L calf.   RUE wrist peripheral IV is clean, no erythema  Lab Results  Basename 04/03/12 0340 04/02/12 0442  WBC 15.4* 17.3*  HGB 10.8* 11.5*  HCT 32.8* 34.9*  NA 130* 133*  K  3.8 3.9  CL 97 99  CO2 23 25  BUN 14 16  CREATININE 1.03 1.13  GLU -- --   Liver Panel No results found for this basename: PROT:2,ALBUMIN:2,AST:2,ALT:2,ALKPHOS:2,BILITOT:2,BILIDIR:2,IBILI:2 in the last 72 hours Sedimentation Rate No results found for this basename: ESRSEDRATE in the last 72 hours C-Reactive Protein No results found for this basename: CRP:2 in the last 72 hours  Microbiology: Recent Results (from the past 240 hour(s))  CULTURE, BLOOD (ROUTINE X 2)     Status: Normal (Preliminary result)   Collection Time   03/29/12 10:30 AM      Component Value Range Status Comment   Specimen Description BLOOD LEFT HAND   Final    Special Requests BOTTLES DRAWN AEROBIC ONLY   Final    Culture  Setup Time [id number]   Final    Culture     Final    Value:        BLOOD CULTURE RECEIVED NO GROWTH TO DATE CULTURE WILL BE HELD FOR 5 DAYS BEFORE ISSUING A FINAL NEGATIVE REPORT   Report Status PENDING    Incomplete   CULTURE, BLOOD (ROUTINE X 2)     Status: Normal (Preliminary result)   Collection Time   03/29/12 11:09 AM      Component Value Range Status Comment   Specimen Description BLOOD RIGHT WRIST   Final    Special Requests BOTTLES DRAWN AEROBIC ONLY   Final    Culture  Setup Time [id number]   Final    Culture     Final    Value:        BLOOD CULTURE RECEIVED NO GROWTH TO DATE CULTURE WILL BE HELD FOR 5 DAYS BEFORE ISSUING A FINAL NEGATIVE REPORT   Report Status PENDING   Incomplete   URINE CULTURE     Status: Normal   Collection Time   03/29/12  3:03 PM      Component Value Range Status Comment   Specimen Description URINE, CLEAN CATCH   Final    Special Requests NONE   Final    Culture  Setup Time [id number]   Final    Colony Count NO GROWTH   Final    Culture NO GROWTH   Final    Report Status 03/30/2012 FINAL   Final   TISSUE CULTURE     Status: Normal   Collection Time   03/30/12  2:17 PM      Component Value Range Status Comment   Specimen Description TOE LEFT SECOND   Final    Special Requests NONE   Final    Gram Stain     Final    Value: NO WBC SEEN     RARE SQUAMOUS EPITHELIAL CELLS PRESENT     RARE GRAM POSITIVE COCCI IN PAIRS   Culture     Final    Value: MODERATE STAPHYLOCOCCUS AUREUS     Note: RIFAMPIN AND GENTAMICIN SHOULD NOT BE USED AS SINGLE DRUGS FOR TREATMENT OF STAPH INFECTIONS. This organism DOES NOT demonstrate inducible Clindamycin resistance in vitro.     FEW KLEBSIELLA PNEUMONIAE   Report Status 04/02/2012 FINAL   Final    Organism ID, Bacteria STAPHYLOCOCCUS AUREUS   Final    Organism ID, Bacteria KLEBSIELLA PNEUMONIAE   Final   ANAEROBIC CULTURE     Status: Normal (Preliminary result)   Collection Time   03/30/12  2:18 PM      Component Value Range Status Comment   Specimen Description  TISSUE TOE LEFT SECOND   Final    Special Requests NONE   Final    Gram Stain     Final    Value: NO WBC SEEN     RARE SQUAMOUS EPITHELIAL CELLS  PRESENT     RARE GRAM POSITIVE COCCI IN PAIRS   Culture     Final    Value: NO ANAEROBES ISOLATED; CULTURE IN PROGRESS FOR 5 DAYS   Report Status PENDING   Incomplete   AFB CULTURE WITH SMEAR     Status: Normal (Preliminary result)   Collection Time   03/30/12  2:18 PM      Component Value Range Status Comment   Specimen Description TISSUE TOE LEFT SECOND   Final    Special Requests NONE   Final    ACID FAST SMEAR NO ACID FAST BACILLI SEEN   Final    Culture     Final    Value: CULTURE WILL BE EXAMINED FOR 6 WEEKS BEFORE ISSUING A FINAL REPORT   Report Status PENDING   Incomplete   CULTURE, BLOOD (ROUTINE X 2)     Status: Normal (Preliminary result)   Collection Time   04/01/12  5:55 PM      Component Value Range Status Comment   Specimen Description BLOOD LEFT ARM   Final    Special Requests BOTTLES DRAWN AEROBIC ONLY    Final    Culture  Setup Time [id number]   Final    Culture     Final    Value:        BLOOD CULTURE RECEIVED NO GROWTH TO DATE CULTURE WILL BE HELD FOR 5 DAYS BEFORE ISSUING A FINAL NEGATIVE REPORT   Report Status PENDING   Incomplete   CULTURE, BLOOD (ROUTINE X 2)     Status: Normal (Preliminary result)   Collection Time   04/01/12  6:05 PM      Component Value Range Status Comment   Specimen Description Blood   Final    Special Requests NONE   Final    Culture  Setup Time [id number]   Final    Culture     Final    Value:        BLOOD CULTURE RECEIVED NO GROWTH TO DATE CULTURE WILL BE HELD FOR 5 DAYS BEFORE ISSUING A FINAL NEGATIVE REPORT   Report Status PENDING   Incomplete   URINE CULTURE     Status: Normal   Collection Time   04/01/12 10:29 PM      Component Value Range Status Comment   Specimen Description URINE, RANDOM   Final    Special Requests NONE   Final    Culture  Setup Time [id number]   Final    Colony Count NO GROWTH   Final    Culture NO GROWTH   Final    Report Status 04/03/2012 FINAL   Final     Studies/Results: Dg Chest 2  View  04/01/2012  *RADIOLOGY REPORT*  Clinical Data: Pneumonia.  CHEST - 2 VIEW  Comparison: Two-view chest 03/29/2012.  Findings: The heart size is normal.  The lungs are clear.  Chronic bronchitic changes stable.  No focal airspace disease is evident. The visualized soft tissues and bony thorax are unremarkable.  IMPRESSION:  1.  Chronic bronchitic change. 2.  No definite focal airspace disease. 3.  Low lung volumes.  Original Report Authenticated By: Jamesetta Orleans. MATTERN, M.D.     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  Temp 101 pm of 4-13. His WBC has gone up from 4-13 but down again somewhat.  Will place [facility], plan for 2 weeks of anbx post-op.  Plan for d/c if he remains afebrile overnight.  Johny Sax Infectious Diseases [phone] 04/03/2012, 3:22 PM   LOS: 5 days

## 2012-04-04 LAB — CULTURE, BLOOD (ROUTINE X 2)
Culture  Setup Time: 201304101445
Culture: NO GROWTH
Culture: NO GROWTH

## 2012-04-04 LAB — CBC
HCT: 32.8 % — ABNORMAL LOW (ref 39.0–52.0)
Hemoglobin: 10.8 g/dL — ABNORMAL LOW (ref 13.0–17.0)
MCH: 28 pg (ref 26.0–34.0)
MCHC: 32.9 g/dL (ref 30.0–36.0)
RDW: 13.3 % (ref 11.5–15.5)

## 2012-04-04 LAB — DIFFERENTIAL
Basophils Absolute: 0.1 10*3/uL (ref 0.0–0.1)
Basophils Relative: 1 % (ref 0–1)
Eosinophils Relative: 2 % (ref 0–5)
Lymphocytes Relative: 15 % (ref 12–46)
Monocytes Absolute: 1.2 10*3/uL — ABNORMAL HIGH (ref 0.1–1.0)
Monocytes Relative: 9 % (ref 3–12)
Neutro Abs: 10.5 10*3/uL — ABNORMAL HIGH (ref 1.7–7.7)

## 2012-04-04 LAB — BASIC METABOLIC PANEL
BUN: 12 mg/dL (ref 6–23)
Calcium: 8.8 mg/dL (ref 8.4–10.5)
GFR calc Af Amer: >90 mL/min (ref >90)
GFR calc non Af Amer: 84 mL/min — ABNORMAL LOW (ref >90)
Glucose, Bld: 280 mg/dL — ABNORMAL HIGH (ref 70–99)
Potassium: 4.5 mEq/L (ref 3.5–5.1)

## 2012-04-04 LAB — ANAEROBIC CULTURE

## 2012-04-04 MED ORDER — FLUTICASONE PROPIONATE 50 MCG/ACT NA SUSP: 2.0000 | Freq: Every day | NASAL | Status: DC

## 2012-04-04 MED ORDER — VITAMIN D3 25 MCG (1000 UNIT) PO TABS: 2000.0000 [IU] | ORAL_TABLET | Freq: Two times a day (BID) | ORAL | Status: DC

## 2012-04-04 MED ORDER — VITAMIN D3 25 MCG (1000 UNIT) PO TABS
2000.0000 [IU] | ORAL_TABLET | Freq: Two times a day (BID) | ORAL | Status: DC
  Administered 2012-04-04: 2000 [IU] via ORAL
  Filled 2012-04-04 (×4): qty 2

## 2012-04-04 MED ORDER — PIPERACILLIN-TAZOBACTAM 3.375 G IVPB: 3.3750 g | Freq: Three times a day (TID) | INTRAVENOUS | Status: DC

## 2012-04-04 MED ORDER — MAGNESIUM OXIDE 400 MG PO TABS
800.0000 mg | ORAL_TABLET | Freq: Two times a day (BID) | ORAL | Status: DC
  Filled 2012-04-04 (×4): qty 2

## 2012-04-04 MED ORDER — OXYCODONE HCL 5 MG PO TABS: 5.0000 mg | ORAL_TABLET | ORAL | Status: DC | PRN

## 2012-04-04 MED ORDER — LORATADINE-PSEUDOEPHEDRINE ER 5-120 MG PO TB12: 1.0000 | ORAL_TABLET | Freq: Two times a day (BID) | ORAL | Status: DC

## 2012-04-04 MED ORDER — DSS 100 MG PO CAPS: 100.0000 mg | ORAL_CAPSULE | Freq: Two times a day (BID) | ORAL | Status: DC

## 2012-04-04 MED ORDER — METFORMIN HCL ER 500 MG PO TB24: 1000.0000 mg | ORAL_TABLET | Freq: Two times a day (BID) | ORAL | Status: DC

## 2012-04-04 NOTE — Progress Notes (Signed)
Patient discharged home, all discharge medications and instructions reviewed and questions answered.  Pt to be assisted to vehicle when ride arrives.

## 2012-04-04 NOTE — Progress Notes (Signed)
PT Cancellation Note  Treatment cancelled today due to patient's refusal to participate. Pt had been up in room earlier today on his own and now states he is too tired for PT.  Donnetta Hail 04/04/2012, 3:16 PM

## 2012-04-04 NOTE — Progress Notes (Signed)
INFECTIOUS DISEASE PROGRESS NOTE  ID: Anthony Holder is a 71 y.o. male with   Principal Problem:  *Diabetic infection of left foot Active Problems:  DIABETES MELLITUS, TYPE II  DYSLIPIDEMIA  HYPERTENSION  CAD  CHF  CAROTID ARTERY DISEASE  Necrotic toes  Cellulitis and abscess of foot  Diabetic ulcer of toe  Leukocytosis  Hyponatremia  CVA (cerebral infarction)  Osteomyelitis of ankle or foot  Aortic stenosis  Fever  Gangrene of toe  Subjective: Without complaints, wants to go home. No diarrhea.   Abtx:  Anti-infectives     Start     Dose/Rate Route Frequency Ordered Stop   03/30/12 0000  piperacillin-tazobactam (ZOSYN) IVPB 3.375 g       3.375 g 12.5 mL/hr over 240 Minutes Intravenous Every 8 hours 03/29/12 1946     03/29/12 2200   vancomycin (VANCOCIN) 1,250 mg in sodium chloride 0.9 % 250 mL IVPB  Status:  Discontinued        1,250 mg 166.7 mL/hr over 90 Minutes Intravenous Every 12 hours 03/29/12 1520 04/03/12 1530   03/29/12 1445   ciprofloxacin (CIPRO) IVPB 400 mg  Status:  Discontinued        400 mg 200 mL/hr over 60 Minutes Intravenous Every 12 hours 03/29/12 1433 03/29/12 1944   03/29/12 1130  piperacillin-tazobactam (ZOSYN) IVPB 3.375 g       3.375 g 12.5 mL/hr over 240 Minutes Intravenous  Once 03/29/12 1055 03/29/12 1939   03/29/12 1100   vancomycin (VANCOCIN) IVPB 1000 mg/200 mL premix        1,000 mg 200 mL/hr over 60 Minutes Intravenous  Once 03/29/12 1026 03/29/12 1254          Medications:  Scheduled:   . aspirin  325 mg Oral Daily  . atenolol  50 mg Oral Daily  . cholecalciferol  2,000 Units Oral BID  . colesevelam  1,875 mg Oral BID  . vitamin B-12  500 mcg Oral BID  . docusate sodium  100 mg Oral BID  . fluticasone  2 spray Each Nare Daily  . furosemide  40 mg Oral BID  . insulin aspart  0-15 Units Subcutaneous TID WC  . insulin glargine  15 Units Subcutaneous QAC breakfast  . loratadine  10 mg Oral Daily  . magnesium oxide  800  mg Oral BID  . pantoprazole  40 mg Oral Q0600  . piperacillin-tazobactam (ZOSYN)  IV  3.375 g Intravenous Q8H  . polyethylene glycol  17 g Oral Daily  . potassium chloride SA  20 mEq Oral BID  . pseudoephedrine  120 mg Oral BID  . sodium chloride  10-40 mL Intracatheter Q12H  . vitamin C  500 mg Oral QID  . DISCONTD: insulin glargine  10 Units Subcutaneous QAC breakfast  . DISCONTD: Mag Oxide-Vit D3-Turmeric  2 capsule Oral BID  . DISCONTD: vancomycin  1,250 mg Intravenous Q12H    Objective: Vital signs in last 24 hours: Temp:  [97.8 F (36.6 C)-100.5 F (38.1 C)] 97.8 F (36.6 C) (04/16 0430) Pulse Rate:  [77-88] 77  (04/16 0430) Resp:  [18] 18  (04/16 0430) BP: (122-156)/(75-84) 149/81 mmHg (04/16 0430) SpO2:  [98 %-99 %] 99 % (04/16 0430) Weight:  [94.212 kg (207 lb 11.2 oz)] 94.212 kg (207 lb 11.2 oz) (04/16 0700)   General appearance: alert, cooperative and no distress Extremities: RUE PIC is clean, nt Incision/Wound:his L foot is unchanged: similar erythema, proximal blister unchanged. Unable to feel a post tib  pulse. No d/c from his wound.   Lab Results  [facility] 04/04/12 0417 04/03/12 0340  WBC 14.3* 15.4*  HGB 10.8* 10.8*  HCT 32.8* 32.8*  NA 128* 130*  K 4.5 3.8  CL 95* 97  CO2 22 23  BUN 12 14  CREATININE 0.93 1.03  GLU -- --   Liver Panel No results found for this basename: PROT:2,ALBUMIN:2,AST:2,ALT:2,ALKPHOS:2,BILITOT:2,BILIDIR:2,IBILI:2 in the last 72 hours Sedimentation Rate No results found for this basename: ESRSEDRATE in the last 72 hours C-Reactive Protein No results found for this basename: CRP:2 in the last 72 hours  Microbiology: Recent Results (from the past 240 hour(s))  CULTURE, BLOOD (ROUTINE X 2)     Status: Normal   Collection Time   03/29/12 10:30 AM      Component Value Range Status Comment   Specimen Description BLOOD LEFT HAND   Final    Special Requests BOTTLES DRAWN AEROBIC ONLY   Final    Culture  Setup Time  [id number]   Final    Culture NO GROWTH 5 DAYS   Final    Report Status 04/04/2012 FINAL   Final   CULTURE, BLOOD (ROUTINE X 2)     Status: Normal   Collection Time   03/29/12 11:09 AM      Component Value Range Status Comment   Specimen Description BLOOD RIGHT WRIST   Final    Special Requests BOTTLES DRAWN AEROBIC ONLY   Final    Culture  Setup Time [id number]   Final    Culture NO GROWTH 5 DAYS   Final    Report Status 04/04/2012 FINAL   Final   URINE CULTURE     Status: Normal   Collection Time   03/29/12  3:03 PM      Component Value Range Status Comment   Specimen Description URINE, CLEAN CATCH   Final    Special Requests NONE   Final    Culture  Setup Time [id number]   Final    Colony Count NO GROWTH   Final    Culture NO GROWTH   Final    Report Status 03/30/2012 FINAL   Final   TISSUE CULTURE     Status: Normal   Collection Time   03/30/12  2:17 PM      Component Value Range Status Comment   Specimen Description TOE LEFT SECOND   Final    Special Requests NONE   Final    Gram Stain     Final    Value: NO WBC SEEN     RARE SQUAMOUS EPITHELIAL CELLS PRESENT     RARE GRAM POSITIVE COCCI IN PAIRS   Culture     Final    Value: MODERATE STAPHYLOCOCCUS AUREUS     Note: RIFAMPIN AND GENTAMICIN SHOULD NOT BE USED AS SINGLE DRUGS FOR TREATMENT OF STAPH INFECTIONS. This organism DOES NOT demonstrate inducible Clindamycin resistance in vitro.     FEW KLEBSIELLA PNEUMONIAE   Report Status 04/02/2012 FINAL   Final    Organism ID, Bacteria STAPHYLOCOCCUS AUREUS   Final    Organism ID, Bacteria KLEBSIELLA PNEUMONIAE   Final   ANAEROBIC CULTURE     Status: Normal   Collection Time   03/30/12  2:18 PM      Component Value Range Status Comment   Specimen Description TISSUE TOE LEFT SECOND   Final    Special Requests NONE   Final    Gram Stain     Final  Value: NO WBC SEEN     RARE SQUAMOUS EPITHELIAL CELLS PRESENT     RARE GRAM POSITIVE COCCI IN PAIRS   Culture NO  ANAEROBES ISOLATED   Final    Report Status 04/04/2012 FINAL   Final   AFB CULTURE WITH SMEAR     Status: Normal (Preliminary result)   Collection Time   03/30/12  2:18 PM      Component Value Range Status Comment   Specimen Description TISSUE TOE LEFT SECOND   Final    Special Requests NONE   Final    ACID FAST SMEAR NO ACID FAST BACILLI SEEN   Final    Culture     Final    Value: CULTURE WILL BE EXAMINED FOR 6 WEEKS BEFORE ISSUING A FINAL REPORT   Report Status PENDING   Incomplete   CULTURE, BLOOD (ROUTINE X 2)     Status: Normal (Preliminary result)   Collection Time   04/01/12  5:55 PM      Component Value Range Status Comment   Specimen Description BLOOD LEFT ARM   Final    Special Requests BOTTLES DRAWN AEROBIC ONLY    Final    Culture  Setup Time [id number]   Final    Culture     Final    Value:        BLOOD CULTURE RECEIVED NO GROWTH TO DATE CULTURE WILL BE HELD FOR 5 DAYS BEFORE ISSUING A FINAL NEGATIVE REPORT   Report Status PENDING   Incomplete   CULTURE, BLOOD (ROUTINE X 2)     Status: Normal (Preliminary result)   Collection Time   04/01/12  6:05 PM      Component Value Range Status Comment   Specimen Description Blood   Final    Special Requests NONE   Final    Culture  Setup Time [id number]   Final    Culture     Final    Value:        BLOOD CULTURE RECEIVED NO GROWTH TO DATE CULTURE WILL BE HELD FOR 5 DAYS BEFORE ISSUING A FINAL NEGATIVE REPORT   Report Status PENDING   Incomplete   URINE CULTURE     Status: Normal   Collection Time   04/01/12 10:29 PM      Component Value Range Status Comment   Specimen Description URINE, RANDOM   Final    Special Requests NONE   Final    Culture  Setup Time [id number]   Final    Colony Count NO GROWTH   Final    Culture NO GROWTH   Final    Report Status 04/03/2012 FINAL   Final     Studies/Results: No results found.   Assessment/Plan: S/p amputation of L 2nd toe, gangrene, cellulitis  Day 7 anbx  (zosyn  alone).  WBC coming down today, had low grade T overnight. Would be ok with d/c pt provided he has close follow up (see his MD before end of week).  Still possible that this is due to vascular problem (vs solely infection).    Johny Sax Infectious Diseases [phone] 04/04/2012, 2:35 PM   LOS: 6 days

## 2012-04-04 NOTE — Discharge Summary (Signed)
Discharge Summary  Anthony Holder MR#: [id number]  DOB:[birth year]  Date of Admission: 03/29/2012 Date of Discharge: 04/04/2012  Patient's PCP: Anthony Corwin, MD, MD  Attending Physician:Maybelline Kolarik  Consults:   #1 orthopedic surgery: Dr. Victorino Dike #2 infectious disease: Dr. Ninetta Lights  Discharge Diagnoses: Diabetic infection of left foot Present on Admission:  .Necrotic toes .Cellulitis and abscess of foot .Diabetic ulcer of toe .Leukocytosis .Hyponatremia .DIABETES MELLITUS, TYPE II .DYSLIPIDEMIA .HYPERTENSION .CAD .CHF .CAROTID ARTERY DISEASE .CVA (cerebral infarction) .Osteomyelitis of ankle or foot .Aortic stenosis .Diabetic infection of left foot .Gangrene of toe   Brief Admitting History and Physical [name] is a 71 year old Caucasian gentleman with a history of type 2 diabetes, diabetic toe ulcer, hypertension, hyperlipidemia, coronary artery disease status post CABG, status post carotid endarterectomy, who presents to the ED from the wound care clinic secondary to a worsening left foot second toe diabetic ulcer. Patient states that he's had is that diabetic left toe ulcer for approximately 2 months and had been seeking treatment at the wound care center over the past 6 weeks. Patient stated that even started going to the hyperbaric oxygen chamber. Patient stated that he saw his orthopod Dr. Victorino Dike one week prior to admission and that time the tip of his left second toe was black. He states that x-rays were done and he was scheduled for an MRI today. Patient stated that between last week and this week over the past 7 days the left second toe has become more black and he's developed an erythema with some warm -- left foot as well as some swelling. Patient does endorse some chills 1 day prior to admission also endorses some generalized weakness and fatigue and episode of confusion which has since then resolved. Patient denies any fevers, no nausea, no vomiting, no  chest pain, no shortness of breath, no syncope, no abdominal pain, no nausea, and no diarrhea, no dysuria. Patient does endorse some constipation. Patient states that 2 weeks prior to admission he been on Septra antibiotics for his toe. Patient went to the wound care center on the day of admission for treatment and he states that when the bandage was removed and his toe was observed he was direct it to present to the emergency department. Patient was seen in the ED was noted to have a necrotic second toe x-rays were done which were consistent with osteomyelitis a CBC which was done did show a leukocytosis with a white count of 16,000 be met which was done did show a sodium of 129. Blood cultures were obtained patient was given some IV vancomycin and Zosyn and we will call to admit the patient for further evaluation and management. The ED physician has also spoken with Dr. Victorino Dike of orthopedics who is on a consult on the patient. For the rest of admission history and physical please see H&P dictated by Dr. Janee Morn.   Discharge Medications Medication List  As of 04/04/2012  5:11 PM   START taking these medications         DSS 100 MG Caps   Take 100 mg by mouth 2 (two) times daily.      fluticasone 50 MCG/ACT nasal spray   Commonly known as: FLONASE   Place 2 sprays into the nose daily. As needed.      loratadine-pseudoephedrine 5-120 MG per tablet   Commonly known as: CLARITIN-D 12-hour   Take 1 tablet by mouth 2 (two) times daily. Take for 5 days, then stop      oxyCODONE  5 MG immediate release tablet   Commonly known as: Oxy IR/ROXICODONE   Take 1 tablet (5 mg total) by mouth every 4 (four) hours as needed.      piperacillin-tazobactam 3-0.375 GM/50ML IVPB   Commonly known as: ZOSYN   Inject 50 mLs (3.375 g total) into the vein every 8 (eight) hours. Use for 2 weeks   Start taking on: 04/05/2012         CONTINUE taking these medications         atenolol 50 MG tablet   Commonly known  as: TENORMIN      BAYER ASPIRIN 325 MG tablet   Generic drug: aspirin      colesevelam 625 MG tablet   Commonly known as: WELCHOL      furosemide 40 MG tablet   Commonly known as: LASIX      MAG OXIDE-VIT D3-TURMERIC PO      metFORMIN 500 MG 24 hr tablet   Commonly known as: GLUCOPHAGE-XR   Take 2 tablets (1,000 mg total) by mouth 2 (two) times daily.      potassium chloride SA 20 MEQ tablet   Commonly known as: K-DUR,KLOR-CON      vitamin B-12 500 MCG tablet   Commonly known as: CYANOCOBALAMIN      vitamin C 500 MG tablet   Commonly known as: ASCORBIC ACID          Where to get your medications    These are the prescriptions that you need to pick up.   You may get these medications from any pharmacy.         DSS 100 MG Caps   fluticasone 50 MCG/ACT nasal spray   loratadine-pseudoephedrine 5-120 MG per tablet   metFORMIN 500 MG 24 hr tablet   oxyCODONE 5 MG immediate release tablet   piperacillin-tazobactam 3-0.375 GM/50ML IVPB           Hospital Course: #1 Diabetic infection of left foot/gangrene toe/cellulitis/Myositis Patient was admitted with a diabetic foot infection with a gangrene his second left toe. He was placed empirically on IV vancomycin and Zosyn and blood cultures were obtained. A orthopedic consultation was obtained and patient was seen in consultation by Dr. Victorino Dike. An ID consultation was also obtained and patient was seen in consultation by Dr. Ninetta Lights patient was placed on a MedSurg floor was placed on supportive care an MRI was obtained. MRI showed resorption of the terminal phalanx of the second toe without definite findings of osteomyelitis in the remaining phalanges remaining toes the remaining forefoot. Subcutaneous edema and enhancement suggesting cellulitis dorsally and mildly greater than expected enhancement in the right musculature of the foot suggesting myositis no drainable abscess was observed. 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 Victorino Dike on 03/31/2012. Patient was subsequently made weightbearing as tolerated and seen by physical therapy. Patient was maintained on IV antibiotics. Tissue cultures were obtained perioperatively which grew out MSSA and Klebsiella pneumonia. Patient's antibiotic coverage was subsequently narrowed to, vancomycin was discontinued and patient was maintained on IV Zosyn. Per ID recommendations patient is to continue on IV Zosyn x2 weeks postop. Patient spiked a fever during the hospitalization repeat chest x-ray and urinalysis were obtained which were negative. Blood cultures which will also obtained were negative. The night prior to discharge patient did have a low-grade temperature however remained afebrile throughout the day of discharge. Patient will be discharged home on  2 more weeks of IV Zosyn and has been recommended he followup with his PCP this Friday 04/07/2012 4 close followup. Patient is also to followup at the wound care clinic and also to followup with orthopedic surgeon Dr. Victorino Dike 2 weeks post discharge. Patient be discharged in stable and improved condition.  #2 diabetes mellitus type 2 During the hospitalization patient had elevated levels of CBGs. Patient's hemoglobin A1c which was checked came back elevated at 9.6. Patient was maintained on Lantus during the hospitalization with titration of Lantus. Patient also was seen by the diabetes coordinator as well. Patient was reluctant to be started on insulin at home and did not 1 to be discharged home on insulin. Patient was subsequently be discharged home on an increased dose of his Glucophage at 1000 mg twice daily. Patient will need to followup with PCP as outpatient for further evaluation and management.  The rest of patient's chronic medical issues remained stable throughout the hospitalization.  Present on Admission:  .Necrotic toes .Cellulitis  and abscess of foot .Diabetic ulcer of toe .Leukocytosis .Hyponatremia .DIABETES MELLITUS, TYPE II .DYSLIPIDEMIA .HYPERTENSION .CAD .CHF .CAROTID ARTERY DISEASE .CVA (cerebral infarction) .Osteomyelitis of ankle or foot .Aortic stenosis .Diabetic infection of left foot .Gangrene of toe   Day of Discharge BP 133/59  Pulse 75  Temp(Src) 97.3 F (36.3 C) (Oral)  Resp 14  Ht 5\' 10"  (1.778 m)  Wt 94.212 kg (207 lb 11.2 oz)  BMI 29.80 kg/m2  SpO2 100% Subjective: No complaints. Patient wants to go home today. General: Alert, awake, oriented x3, in no acute distress. HEENT: No bruits, no goiter. Heart: Regular rate and rhythm, without murmurs, rubs, gallops. Lungs: Clear to auscultation bilaterally. Abdomen: Soft, nontender, nondistended, positive bowel sounds. Extremities: No clubbing cyanosis.left lower extremity with 1+ edema and wrapped in Ace bandage with positive pedal pulses. Neuro: Grossly intact, nonfocal.   Results for orders placed during the hospital encounter of 03/29/12 (from the past 48 hour(s))  GLUCOSE, CAPILLARY     Status: Abnormal   Collection Time   04/02/12  8:47 PM      Component Value Range Comment   Glucose-Capillary 338 (*) 70 - 99 (mg/dL)   DIFFERENTIAL     Status: Abnormal   Collection Time   04/03/12  3:40 AM      Component Value Range Comment   Neutrophils Relative 77  43 - 77 (%)    Neutro Abs 11.9 (*) 1.7 - 7.7 (K/uL)    Lymphocytes Relative 12  12 - 46 (%)    Lymphs Abs 1.9  0.7 - 4.0 (K/uL)    Monocytes Relative 8  3 - 12 (%)    Monocytes Absolute 1.2 (*) 0.1 - 1.0 (K/uL)    Eosinophils Relative 2  0 - 5 (%)    Eosinophils Absolute 0.3  0.0 - 0.7 (K/uL)    Basophils Relative 1  0 - 1 (%)    Basophils Absolute 0.1  0.0 - 0.1 (K/uL)   CBC     Status: Abnormal   Collection Time   04/03/12  3:40 AM      Component Value Range Comment   WBC 15.4 (*) 4.0 - 10.5 (K/uL)    RBC 3.86 (*) 4.22 - 5.81 (MIL/uL)    Hemoglobin 10.8 (*) 13.0 - 17.0  (g/dL)    HCT 44.0 (*) 10.2 - 52.0 (%)    MCV 85.0  78.0 - 100.0 (fL)    MCH 28.0  26.0 - 34.0 (pg)  MCHC 32.9  30.0 - 36.0 (g/dL)    RDW 16.1  09.6 - 04.5 (%)    Platelets 309  150 - 400 (K/uL)   BASIC METABOLIC PANEL     Status: Abnormal   Collection Time   04/03/12  3:40 AM      Component Value Range Comment   Sodium 130 (*) 135 - 145 (mEq/L)    Potassium 3.8  3.5 - 5.1 (mEq/L)    Chloride 97  96 - 112 (mEq/L)    CO2 23  19 - 32 (mEq/L)    Glucose, Bld 255 (*) 70 - 99 (mg/dL)    BUN 14  6 - 23 (mg/dL)    Creatinine, Ser 4.09  0.50 - 1.35 (mg/dL)    Calcium 8.5  8.4 - 10.5 (mg/dL)    GFR calc non Af Amer 72 (*) >90 (mL/min)    GFR calc Af Amer 84 (*) >90 (mL/min)   GLUCOSE, CAPILLARY     Status: Abnormal   Collection Time   04/03/12  7:35 AM      Component Value Range Comment   Glucose-Capillary 267 (*) 70 - 99 (mg/dL)   GLUCOSE, CAPILLARY     Status: Abnormal   Collection Time   04/03/12 11:45 AM      Component Value Range Comment   Glucose-Capillary 243 (*) 70 - 99 (mg/dL)   GLUCOSE, CAPILLARY     Status: Abnormal   Collection Time   04/03/12  4:39 PM      Component Value Range Comment   Glucose-Capillary 283 (*) 70 - 99 (mg/dL)   GLUCOSE, CAPILLARY     Status: Abnormal   Collection Time   04/03/12  9:22 PM      Component Value Range Comment   Glucose-Capillary 237 (*) 70 - 99 (mg/dL)   DIFFERENTIAL     Status: Abnormal   Collection Time   04/04/12  4:17 AM      Component Value Range Comment   Neutrophils Relative 73  43 - 77 (%)    Neutro Abs 10.5 (*) 1.7 - 7.7 (K/uL)    Lymphocytes Relative 15  12 - 46 (%)    Lymphs Abs 2.2  0.7 - 4.0 (K/uL)    Monocytes Relative 9  3 - 12 (%)    Monocytes Absolute 1.2 (*) 0.1 - 1.0 (K/uL)    Eosinophils Relative 2  0 - 5 (%)    Eosinophils Absolute 0.3  0.0 - 0.7 (K/uL)    Basophils Relative 1  0 - 1 (%)    Basophils Absolute 0.1  0.0 - 0.1 (K/uL)   CBC     Status: Abnormal   Collection Time   04/04/12  4:17 AM       Component Value Range Comment   WBC 14.3 (*) 4.0 - 10.5 (K/uL)    RBC 3.86 (*) 4.22 - 5.81 (MIL/uL)    Hemoglobin 10.8 (*) 13.0 - 17.0 (g/dL)    HCT 81.1 (*) 91.4 - 52.0 (%)    MCV 85.0  78.0 - 100.0 (fL)    MCH 28.0  26.0 - 34.0 (pg)    MCHC 32.9  30.0 - 36.0 (g/dL)    RDW 78.2  95.6 - 21.3 (%)    Platelets 349  150 - 400 (K/uL)   BASIC METABOLIC PANEL     Status: Abnormal   Collection Time   04/04/12  4:17 AM      Component Value Range Comment   Sodium  128 (*) 135 - 145 (mEq/L)    Potassium 4.5  3.5 - 5.1 (mEq/L)    Chloride 95 (*) 96 - 112 (mEq/L)    CO2 22  19 - 32 (mEq/L)    Glucose, Bld 280 (*) 70 - 99 (mg/dL)    BUN 12  6 - 23 (mg/dL)    Creatinine, Ser 1.61  0.50 - 1.35 (mg/dL)    Calcium 8.8  8.4 - 10.5 (mg/dL)    GFR calc non Af Amer 84 (*) >90 (mL/min)    GFR calc Af Amer >90  >90 (mL/min)   GLUCOSE, CAPILLARY     Status: Abnormal   Collection Time   04/04/12  7:53 AM      Component Value Range Comment   Glucose-Capillary 269 (*) 70 - 99 (mg/dL)   GLUCOSE, CAPILLARY     Status: Abnormal   Collection Time   04/04/12 12:40 PM      Component Value Range Comment   Glucose-Capillary 319 (*) 70 - 99 (mg/dL)     Dg Chest 2 View  0/96/0454  *RADIOLOGY REPORT*  Clinical Data: Pneumonia.  CHEST - 2 VIEW  Comparison: Two-view chest 03/29/2012.  Findings: The heart size is normal.  The lungs are clear.  Chronic bronchitic changes stable.  No focal airspace disease is evident. The visualized soft tissues and bony thorax are unremarkable.  IMPRESSION:  1.  Chronic bronchitic change. 2.  No definite focal airspace disease. 3.  Low lung volumes.  Original Report Authenticated By: Jamesetta Orleans. MATTERN, M.D.   Dg Chest 2 View  03/08/2012  *RADIOLOGY REPORT*  Clinical Data: 71 year old male open wound on the left foot. Preoperative study.  Hypertension, diabetes, prior stroke.  CHEST - 2 VIEW  Comparison: 03/07/2009.  Findings: Chronic blunting of the left costophrenic angle.  Stable  lung volumes.  Stable sequelae of CABG.  Cardiac size and mediastinal contours are within normal limits.  Visualized tracheal air column is within normal limits.  No pneumothorax, pulmonary edema, pleural effusion or confluent pulmonary opacity. No acute osseous abnormality identified.  IMPRESSION: No acute cardiopulmonary abnormality.  Original Report Authenticated By: Harley Hallmark, M.D.   [name]  03/30/2012  *RADIOLOGY REPORT*  Clinical Data: Diabetes.  Necrotic second toe.  MRI OF THE LEFT FOREFOOT WITHOUT AND WITH CONTRAST  Technique:  Multiplanar, multisequence MR imaging was performed both before and after administration of intravenous contrast.  Contrast: 20mL MULTIHANCE GADOBENATE DIMEGLUMINE 529 MG/ML IV SOLN  Comparison: 03/29/2012  Findings: There is been resorption of most of the terminal phalanx of the second toe, with associated soft tissue defect.  A small sliver of residual base of the terminal phalanx is still present.  The remaining phalanges of the second toe and in the remaining toes do not demonstrate significant abnormal internal edema signal to suggest active osteomyelitis.  Similarly the metatarsals and adjacent tarsal bones do not demonstrate MR findings of osteomyelitis.  The Lisfranc ligament appears intact.  Subcutaneous edema and low-level subcutaneous enhancement along the dorsum of the foot are suspicious for cellulitis.  There is also mildly greater than expected enhancement infiltrating the plantar musculature of the foot, potentially from myositis.  IMPRESSION:  1.  Resorption of the terminal phalanx of the second toe, without definite findings of osteomyelitis in the remaining phalanges, remaining toes, or remaining forefoot. 2.  Subcutaneous edema and enhancement suggesting cellulitis dorsally. 3.  Mildly greater than expected enhancement in the plantar musculature of the foot  suggesting myositis.  No drainable abscess observed.  Original Report  Authenticated By: Dellia Cloud, M.D.   Dg Chest Port 1 View  03/29/2012  *RADIOLOGY REPORT*  Clinical Data: Preop toe indication.  Hypertension, diabetes.  PORTABLE CHEST - 1 VIEW  Comparison: 03/08/2012  Findings: Prior CABG.  Heart is borderline in size.  Chronic blunting of the left costophrenic angle, likely scarring/pleural thickening.  Mild peribronchial thickening.  No acute opacities or effusions.  IMPRESSION: Scarring/pleural thickening at the left lung base.  Borderline heart size.  Mild bronchitic changes.  Original Report Authenticated By: Cyndie Chime, M.D.   Dg Foot Complete Left  03/29/2012  *RADIOLOGY REPORT*  Clinical Data: Preop left second toe amputation.  Gangrene. Diabetic.  LEFT FOOT - COMPLETE 3+ VIEW  Comparison: None.  Findings: Bone destruction noted at the tip of the left second toe involving much of the distal phalanx.  Soft tissue defect also noted.  Soft tissue gas within the second toe.  No definite osteomyelitis changes within the middle or proximal phalanges.  Degenerative changes in the first MTP joint.  IMPRESSION: Changes of osteomyelitis and bone destruction in the distal phalanx of the left second toe.  Soft tissue defect and soft tissue gas also noted.  Original Report Authenticated By: Cyndie Chime, M.D.     Disposition: Home with home health services  Diet: Diabetic diet  Activity: Weightbearing as tolerated   Follow-up Appts: Discharge Orders    Future Appointments: Provider: Department: Dept Phone: Center:   07/25/2012 1:30 PM [facility] [phone] [facility]     Future Orders Please Complete By Expires   Diet general      Comments:   Diabetic diet/ carb modified.   Increase activity slowly      Discharge instructions      Comments:   Follow up with Anthony Corwin, MD, on Friday 04/07/12. Follow up with Dr Victorino Dike in 2 weeks Follow up at wound care center.    Change dressing (specify)      Comments:   Dressing  change: Daily dressing change with dry gauze wrapped with kerlex and ACE bandage per HHRN.      TESTS THAT NEED FOLLOW-UP Patient will need followup BMET and CBC  Time spent on discharge, talking to the patient, and coordinating care: 60 mins.   SignedRamiro Harvest 04/04/2012, 5:11 PM

## 2012-04-04 NOTE — Progress Notes (Signed)
ANTIBIOTIC CONSULT NOTE - FOLLOW UP  Pharmacy Consult for Zosyn Indication: L toe osteomyelitis/L foot cellulitis  No Known Allergies  Vital Signs: Temp: 97.8 F (36.6 C) (04/16 0430) Temp src: Oral (04/16 0430) BP: 149/81 mmHg (04/16 0430) Pulse Rate: 77  (04/16 0430) Intake/Output from previous day: 04/15 0701 - 04/16 0700 In: 980 [P.O.:980] Out: 1375 [Urine:1375] Intake/Output from this shift: Total I/O In: -  Out: 775 [Urine:775]  Labs:  [facility] 04/04/12 0417 04/03/12 0340 04/02/12 0442  WBC 14.3* 15.4* 17.3*  HGB 10.8* 10.8* 11.5*  PLT 349 309 282  LABCREA -- -- --  CREATININE 0.93 1.03 1.13   Estimated Creatinine Clearance: 86.4 ml/min (by C-G formula based on Cr of 0.93).  Basename 04/01/12 1930  VANCOTROUGH 15.8  VANCOPEAK --  Drue Dun --  GENTTROUGH --  GENTPEAK --  GENTRANDOM --  TOBRATROUGH --  TOBRAPEAK --  TOBRARND --  AMIKACINPEAK --  AMIKACINTROU --  AMIKACIN --     Microbiology: Recent Results (from the past 720 hour(s))  CULTURE, BLOOD (ROUTINE X 2)     Status: Normal   Collection Time   03/29/12 10:30 AM      Component Value Range Status Comment   Specimen Description BLOOD LEFT HAND   Final    Special Requests BOTTLES DRAWN AEROBIC ONLY   Final    Culture  Setup Time [id number]   Final    Culture NO GROWTH 5 DAYS   Final    Report Status 04/04/2012 FINAL   Final   CULTURE, BLOOD (ROUTINE X 2)     Status: Normal   Collection Time   03/29/12 11:09 AM      Component Value Range Status Comment   Specimen Description BLOOD RIGHT WRIST   Final    Special Requests BOTTLES DRAWN AEROBIC ONLY   Final    Culture  Setup Time [id number]   Final    Culture NO GROWTH 5 DAYS   Final    Report Status 04/04/2012 FINAL   Final   URINE CULTURE     Status: Normal   Collection Time   03/29/12  3:03 PM      Component Value Range Status Comment   Specimen Description URINE, CLEAN CATCH   Final    Special Requests NONE   Final    Culture  Setup Time [id number]   Final    Colony Count NO GROWTH   Final    Culture NO GROWTH   Final    Report Status 03/30/2012 FINAL   Final   TISSUE CULTURE     Status: Normal   Collection Time   03/30/12  2:17 PM      Component Value Range Status Comment   Specimen Description TOE LEFT SECOND   Final    Special Requests NONE   Final    Gram Stain     Final    Value: NO WBC SEEN     RARE SQUAMOUS EPITHELIAL CELLS PRESENT     RARE GRAM POSITIVE COCCI IN PAIRS   Culture     Final    Value: MODERATE STAPHYLOCOCCUS AUREUS     Note: RIFAMPIN AND GENTAMICIN SHOULD NOT BE USED AS SINGLE DRUGS FOR TREATMENT OF STAPH INFECTIONS. This organism DOES NOT demonstrate inducible Clindamycin resistance in vitro.     FEW KLEBSIELLA PNEUMONIAE   Report Status 04/02/2012 FINAL   Final    Organism ID, Bacteria STAPHYLOCOCCUS AUREUS   Final    Organism ID,  Bacteria KLEBSIELLA PNEUMONIAE   Final   ANAEROBIC CULTURE     Status: Normal (Preliminary result)   Collection Time   03/30/12  2:18 PM      Component Value Range Status Comment   Specimen Description TISSUE TOE LEFT SECOND   Final    Special Requests NONE   Final    Gram Stain     Final    Value: NO WBC SEEN     RARE SQUAMOUS EPITHELIAL CELLS PRESENT     RARE GRAM POSITIVE COCCI IN PAIRS   Culture     Final    Value: NO ANAEROBES ISOLATED; CULTURE IN PROGRESS FOR 5 DAYS   Report Status PENDING   Incomplete   AFB CULTURE WITH SMEAR     Status: Normal (Preliminary result)   Collection Time   03/30/12  2:18 PM      Component Value Range Status Comment   Specimen Description TISSUE TOE LEFT SECOND   Final    Special Requests NONE   Final    ACID FAST SMEAR NO ACID FAST BACILLI SEEN   Final    Culture     Final    Value: CULTURE WILL BE EXAMINED FOR 6 WEEKS BEFORE ISSUING A FINAL REPORT   Report Status PENDING   Incomplete   CULTURE, BLOOD (ROUTINE X 2)     Status: Normal (Preliminary result)   Collection Time   04/01/12  5:55 PM       Component Value Range Status Comment   Specimen Description BLOOD LEFT ARM   Final    Special Requests BOTTLES DRAWN AEROBIC ONLY    Final    Culture  Setup Time [id number]   Final    Culture     Final    Value:        BLOOD CULTURE RECEIVED NO GROWTH TO DATE CULTURE WILL BE HELD FOR 5 DAYS BEFORE ISSUING A FINAL NEGATIVE REPORT   Report Status PENDING   Incomplete   CULTURE, BLOOD (ROUTINE X 2)     Status: Normal (Preliminary result)   Collection Time   04/01/12  6:05 PM      Component Value Range Status Comment   Specimen Description Blood   Final    Special Requests NONE   Final    Culture  Setup Time [id number]   Final    Culture     Final    Value:        BLOOD CULTURE RECEIVED NO GROWTH TO DATE CULTURE WILL BE HELD FOR 5 DAYS BEFORE ISSUING A FINAL NEGATIVE REPORT   Report Status PENDING   Incomplete   URINE CULTURE     Status: Normal   Collection Time   04/01/12 10:29 PM      Component Value Range Status Comment   Specimen Description URINE, RANDOM   Final    Special Requests NONE   Final    Culture  Setup Time [id number]   Final    Colony Count NO GROWTH   Final    Culture NO GROWTH   Final    Report Status 04/03/2012 FINAL   Final     Anti-infectives     Start     Dose/Rate Route Frequency Ordered Stop   03/30/12 0000  piperacillin-tazobactam (ZOSYN) IVPB 3.375 g       3.375 g 12.5 mL/hr over 240 Minutes Intravenous Every 8 hours 03/29/12 1946     03/29/12 2200   vancomycin (VANCOCIN) 1,250  mg in sodium chloride 0.9 % 250 mL IVPB  Status:  Discontinued        1,250 mg 166.7 mL/hr over 90 Minutes Intravenous Every 12 hours 03/29/12 1520 04/03/12 1530   03/29/12 1445   ciprofloxacin (CIPRO) IVPB 400 mg  Status:  Discontinued        400 mg 200 mL/hr over 60 Minutes Intravenous Every 12 hours 03/29/12 1433 03/29/12 1944   03/29/12 1130  piperacillin-tazobactam (ZOSYN) IVPB 3.375 g       3.375 g 12.5 mL/hr over 240 Minutes Intravenous  Once 03/29/12 1055  03/29/12 1939   03/29/12 1100   vancomycin (VANCOCIN) IVPB 1000 mg/200 mL premix        1,000 mg 200 mL/hr over 60 Minutes Intravenous  Once 03/29/12 1026 03/29/12 1254          Assessment:   71 YO diabetic male with L 2nd necrotic toe/osteo and L foot cellulitis s/p 2nd toe amputation 4/12  Day 7 Zosyn (Vanc d/c 4/15); ID following  Wound culture: MSSA & Kleb pna (sens to Zosyn)  Renal function wnl  Goal of Therapy:  Dose and schedule appropriate for renal function  Plan:  No change Zosyn needed, PICC line 4/15 for 2 wk of IV abx therapy. Pharmacy will sign off, no levels necessary. Thank you,  Otho Bellows PharmD Pager [phone] 04/04/2012,12:01 PM

## 2012-04-04 NOTE — Progress Notes (Signed)
CARE MANAGEMENT NOTE 04/04/2012  Patient:  DEROLD, DORSCH   Account Number:  [id number]  Date Initiated:  03/31/2012  Documentation initiated by:  Raiford Noble  Subjective/Objective Assessment:   PT ADM WITH NECROTIC TOES     Action/Plan:   LIVES WITH WIFE-- SET UP WITH [facility] FOR PT/OT/RN SERVICES- HAS RW AT HOME   Anticipated DC Date:  04/07/2012   Anticipated DC Plan:  HOME W HOME HEALTH SERVICES      DC Planning Services  CM consult      [facility] Choice  HOME HEALTH   Choice offered to / List presented to:  C-1 Patient   DME arranged  NA      DME agency  NA     HH arranged  HH-1 RN  HH-2 PT  HH-3 OT      [facility] agency  [facility]   Status of service:  In process, will continue to follow   Per UR Regulation:  Reviewed for med. necessity/level of care/duration of stay  Comments:  04/04/12, Kathi Der RNC-MNN, [name], [phone], CM confirmed with Darl Pikes at [facility] services ordered. 04-01-12 Oran Rein [phone] Arranged HHC with [facility] for PT/OT/RN services. Norberta Keens made aware. Need HHC orders. .MD paged.

## 2012-04-04 NOTE — Progress Notes (Signed)
Occupational Therapy Note Pt states he doesn't feel he needs to practice any of his ADL further. Plans to sponge bathe at discharge so he didn't feel he needed to practice shower transfer. Pt is anxious to be able to discharge. Will check back on pt if he is here longer than a day or two to see if anything has changed with ADL. Judithann Sauger OTR/L [phone] 04/04/2012

## 2012-04-07 LAB — CULTURE, BLOOD (ROUTINE X 2)
Culture  Setup Time: 201304132046
Culture: NO GROWTH
Culture: NO GROWTH

## 2012-04-11 ENCOUNTER — Telehealth: Payer: Self-pay | Admitting: *Deleted

## 2012-04-11 NOTE — Telephone Encounter (Signed)
I received a fax from [facility] with the note that his WBC were 16.4 despite being on antibiotics. I paged Dr. Ninetta Lights & relayed this. He asked that the pt come in for an office visit. I called the pt & explained that the md wants to see him. transferred to front desk for appt

## 2012-04-12 ENCOUNTER — Encounter (HOSPITAL_COMMUNITY): Payer: Self-pay | Admitting: Orthopedic Surgery

## 2012-04-12 ENCOUNTER — Emergency Department (HOSPITAL_COMMUNITY)
Admission: EM | Admit: 2012-04-12 | Discharge: 2012-04-12 | Disposition: A | Discharge status: Home / Self Care | Payer: Medicare Other | Source: Home / Self Care | Attending: Emergency Medicine | Admitting: Emergency Medicine

## 2012-04-12 ENCOUNTER — Emergency Department (HOSPITAL_COMMUNITY): Payer: Medicare Other

## 2012-04-12 ENCOUNTER — Inpatient Hospital Stay: Payer: Medicare Other | Admitting: Internal Medicine

## 2012-04-12 DIAGNOSIS — R0989 Other specified symptoms and signs involving the circulatory and respiratory systems: Secondary | ICD-10-CM | POA: Insufficient documentation

## 2012-04-12 DIAGNOSIS — R0600 Dyspnea, unspecified: Secondary | ICD-10-CM

## 2012-04-12 DIAGNOSIS — R0609 Other forms of dyspnea: Secondary | ICD-10-CM | POA: Insufficient documentation

## 2012-04-12 DIAGNOSIS — Z951 Presence of aortocoronary bypass graft: Secondary | ICD-10-CM | POA: Insufficient documentation

## 2012-04-12 DIAGNOSIS — E7800 Pure hypercholesterolemia, unspecified: Secondary | ICD-10-CM | POA: Insufficient documentation

## 2012-04-12 DIAGNOSIS — E119 Type 2 diabetes mellitus without complications: Secondary | ICD-10-CM | POA: Insufficient documentation

## 2012-04-12 DIAGNOSIS — Z8679 Personal history of other diseases of the circulatory system: Secondary | ICD-10-CM | POA: Insufficient documentation

## 2012-04-12 DIAGNOSIS — I10 Essential (primary) hypertension: Secondary | ICD-10-CM | POA: Insufficient documentation

## 2012-04-12 DIAGNOSIS — R0602 Shortness of breath: Secondary | ICD-10-CM | POA: Insufficient documentation

## 2012-04-12 DIAGNOSIS — I2510 Atherosclerotic heart disease of native coronary artery without angina pectoris: Secondary | ICD-10-CM | POA: Insufficient documentation

## 2012-04-12 LAB — POCT I-STAT, CHEM 8
BUN: 14 mg/dL (ref 6–23)
Chloride: 104 mEq/L (ref 96–112)
HCT: 34 % — ABNORMAL LOW (ref 39.0–52.0)
Potassium: 4.1 mEq/L (ref 3.5–5.1)
Sodium: 136 mEq/L (ref 135–145)

## 2012-04-12 LAB — CBC
Platelets: 438 10*3/uL — ABNORMAL HIGH (ref 150–400)
RBC: 4.03 MIL/uL — ABNORMAL LOW (ref 4.22–5.81)
RDW: 13.5 % (ref 11.5–15.5)
WBC: 12.7 10*3/uL — ABNORMAL HIGH (ref 4.0–10.5)

## 2012-04-12 LAB — DIFFERENTIAL
Basophils Absolute: 0.1 10*3/uL (ref 0.0–0.1)
Lymphocytes Relative: 18 % (ref 12–46)
Lymphs Abs: 2.3 10*3/uL (ref 0.7–4.0)
Neutro Abs: 9 10*3/uL — ABNORMAL HIGH (ref 1.7–7.7)

## 2012-04-12 LAB — PRO B NATRIURETIC PEPTIDE: Pro B Natriuretic peptide (BNP): 572 pg/mL — ABNORMAL HIGH (ref 0–125)
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
## 2012-04-12 NOTE — Discharge Instructions (Signed)
Dyspnea Shortness of breath (dyspnea) is the feeling of uneasy breathing. Dyspnea should be evaluated promptly. DIAGNOSIS  Many tests may be done to find why you are having shortness of breath. Tests may include:  A chest X-ray.   A lung function test.   Blood tests.   Recordings of the electrical activity of the heart (electrocardiogram).   Exercise testing.   Sound wave images of the heart (a cardiac echocardiogram).   A scan.  A cause for your shortness of breath may not be identified initially. In this case, it is important to have a follow-up exam with your caregiver. HOME CARE INSTRUCTIONS   Do not smoke. Smoking is a common cause of shortness of breath. Ask for help to stop smoking.   Avoid being around chemicals that may bother your breathing, such as paint fumes or dust.   Rest as needed. Slowly begin your usual activities.   If medications were prescribed, take them as directed for the full length of time directed. This includes oxygen and any inhaled medications, if prescribed.   It is very important that you follow up with your caregiver or other physician as directed. Waiting to do so or failure to follow up could result in worsening of your condition, possible disability, or death.   Be sure you understand what to do or who to call if your shortness of breath worsens.  SEEK MEDICAL CARE IF:   Your condition does not improve in the time expected.   You have a hard time doing your normal activities even with rest.   You have any side effects from or problems with medications prescribed.  SEEK IMMEDIATE MEDICAL CARE IF:   You feel your shortness of breath is getting worse.   You feel lightheaded, faint or develop a cough not controlled with medications.   You start coughing up blood.   You get pain with breathing.   You get chest pain or pain in your arms, shoulders or belly (abdomen).   You have a fever.   You are unable to walk up stairs or exercise  the way you normally can.  MAKE SURE YOU:   Understand these instructions.   Will watch your condition.   Will get help right away if you are not doing well or get worse.  Document Released: 01/13/2005 Document Revised: 08/18/2011 Document Reviewed: 04/23/2010 [facility] Patient Information 2012 [place], [place].Shortness of Breath Shortness of breath (dyspnea) is the feeling of uneasy breathing. Shortness of breath does not always mean that there is a life-threatening illness. However, shortness of breath requires immediate medical care. CAUSES  Causes for shortness of breath include:  Not enough oxygen in the air (as with high altitudes or with a smoke-filled room).   Short-term (acute) lung disease, including:   Infections such as pneumonia.   Fluid in the lungs, such as heart failure.   A blood clot in the lungs (pulmonary embolism).   Lasting (chronic) lung diseases.   Heart disease (heart attack, angina, heart failure, and others).   Low red blood cells (anemia).   Poor physical fitness. This can cause shortness of breath when you exercise.   Chest or back injuries or stiffness.   Being overweight (obese).   Anxiety. This can make you feel like you are not getting enough air.  DIAGNOSIS  Serious medical problems can usually be found during your physical exam. Many tests may also be done to determine why you are having shortness of breath. Tests include:  Chest X-rays.   Lung function tests.   Blood tests.   Electrocardiography.   Exercise testing.   A cardiac echo.   Imaging scans.  Your caregiver may not be able to find a cause for your shortness of breath after your exam. In this case, it is important to have a follow-up exam with your caregiver as directed.  HOME CARE INSTRUCTIONS   Do not smoke. Smoking is a common cause of shortness of breath. Ask for help to stop smoking.   Avoid being around chemicals that may bother your breathing (paint fumes,  dust).   Rest as needed. Slowly resume your usual activities.   If medicines were prescribed, take them as directed for the full length of time directed. This includes oxygen and any inhaled medicines.   Follow up with your caregiver as directed. Waiting to do so or failure to follow up could result in worsening of your condition and possible disability or death.   Be sure you understand what to do or who to call if your shortness of breath worsens.  SEEK MEDICAL CARE IF:   Your condition does not improve in the time expected.   You have a hard time doing your normal activities even with rest.   You have any side effects or problems with the medicines prescribed.   You develop any new symptoms.  SEEK IMMEDIATE MEDICAL CARE IF:   Your shortness of breath is getting worse.   You feel lightheaded, faint, or develop a cough not controlled with medicines.   You start coughing up blood.   You have pain with breathing.   You have chest pain or pain in your arms, shoulders, or abdomen.   You have a fever.   You are unable to walk up stairs or exercise the way you normally do.   Your symptoms are getting worse.  Document Released: 08/31/2001 Document Revised: 11/25/2011 Document Reviewed: 04/17/2008 [facility] Patient Information 2012 [place], [place].

## 2012-04-12 NOTE — ED Notes (Signed)
PT states understanding of discharge instructions 

## 2012-04-12 NOTE — ED Notes (Addendum)
To ED for eval of sob. States he feels as if he can't get a 'good breath'. Pt is to have foot surgery tomorrow. Appears in NAD. Pt denies cp

## 2012-04-13 ENCOUNTER — Ambulatory Visit (HOSPITAL_COMMUNITY): Admission: RE | Admit: 2012-04-13 | Payer: Medicare Other | Source: Ambulatory Visit | Admitting: Orthopedic Surgery

## 2012-04-13 ENCOUNTER — Encounter (HOSPITAL_COMMUNITY): Payer: Self-pay | Admitting: *Deleted

## 2012-04-13 ENCOUNTER — Ambulatory Visit (HOSPITAL_COMMUNITY): Payer: Medicare Other | Admitting: Anesthesiology

## 2012-04-13 ENCOUNTER — Encounter (HOSPITAL_COMMUNITY)
Admission: RE | Disposition: A | Discharge status: Home / Self Care | Payer: Self-pay | Source: Ambulatory Visit | Attending: Orthopedic Surgery

## 2012-04-13 ENCOUNTER — Encounter (HOSPITAL_COMMUNITY): Payer: Self-pay | Admitting: Anesthesiology

## 2012-04-13 ENCOUNTER — Inpatient Hospital Stay (HOSPITAL_COMMUNITY)
Admission: RE | Admit: 2012-04-13 | Discharge: 2012-04-19 | DRG: 629 | Disposition: A | Discharge status: Home / Self Care | Payer: Medicare Other | Source: Ambulatory Visit | Attending: Orthopedic Surgery | Admitting: Orthopedic Surgery

## 2012-04-13 ENCOUNTER — Encounter (HOSPITAL_COMMUNITY): Payer: Self-pay | Admitting: Pharmacy Technician

## 2012-04-13 DIAGNOSIS — I2510 Atherosclerotic heart disease of native coronary artery without angina pectoris: Secondary | ICD-10-CM | POA: Diagnosis present

## 2012-04-13 DIAGNOSIS — Z7982 Long term (current) use of aspirin: Secondary | ICD-10-CM

## 2012-04-13 DIAGNOSIS — L03119 Cellulitis of unspecified part of limb: Secondary | ICD-10-CM | POA: Diagnosis present

## 2012-04-13 DIAGNOSIS — E1169 Type 2 diabetes mellitus with other specified complication: Principal | ICD-10-CM | POA: Diagnosis present

## 2012-04-13 DIAGNOSIS — Z951 Presence of aortocoronary bypass graft: Secondary | ICD-10-CM

## 2012-04-13 DIAGNOSIS — I509 Heart failure, unspecified: Secondary | ICD-10-CM | POA: Diagnosis present

## 2012-04-13 DIAGNOSIS — Z8673 Personal history of transient ischemic attack (TIA), and cerebral infarction without residual deficits: Secondary | ICD-10-CM

## 2012-04-13 DIAGNOSIS — E1129 Type 2 diabetes mellitus with other diabetic kidney complication: Secondary | ICD-10-CM | POA: Diagnosis present

## 2012-04-13 DIAGNOSIS — I119 Hypertensive heart disease without heart failure: Secondary | ICD-10-CM | POA: Diagnosis present

## 2012-04-13 DIAGNOSIS — I96 Gangrene, not elsewhere classified: Secondary | ICD-10-CM

## 2012-04-13 DIAGNOSIS — L97509 Non-pressure chronic ulcer of other part of unspecified foot with unspecified severity: Secondary | ICD-10-CM | POA: Diagnosis present

## 2012-04-13 DIAGNOSIS — S98139A Complete traumatic amputation of one unspecified lesser toe, initial encounter: Secondary | ICD-10-CM

## 2012-04-13 DIAGNOSIS — L02619 Cutaneous abscess of unspecified foot: Secondary | ICD-10-CM | POA: Diagnosis present

## 2012-04-13 DIAGNOSIS — Z87891 Personal history of nicotine dependence: Secondary | ICD-10-CM

## 2012-04-13 LAB — SURGICAL PCR SCREEN
MRSA, PCR: NEGATIVE
Staphylococcus aureus: NEGATIVE

## 2012-04-13 LAB — GLUCOSE, CAPILLARY
Glucose-Capillary: 146 mg/dL — ABNORMAL HIGH (ref 70–99)
Glucose-Capillary: 117 mg/dL — ABNORMAL HIGH (ref 70–99)
Glucose-Capillary: 107 mg/dL — ABNORMAL HIGH (ref 70–99)

## 2012-04-13 LAB — SEDIMENTATION RATE: Sed Rate: 128 mm/hr — ABNORMAL HIGH (ref 0–16)

## 2012-04-13 SURGERY — IRRIGATION AND DEBRIDEMENT EXTREMITY
Anesthesia: Monitor Anesthesia Care | Laterality: Left | Wound class: Dirty or Infected

## 2012-04-13 MED ORDER — ROPIVACAINE HCL 5 MG/ML IJ SOLN
INTRAMUSCULAR | Status: DC | PRN
  Administered 2012-04-13: 150 mg via EPIDURAL

## 2012-04-13 MED ORDER — FENTANYL CITRATE 0.05 MG/ML IJ SOLN
50.0000 ug | INTRAMUSCULAR | Status: DC | PRN
  Administered 2012-04-13: 50 ug via INTRAVENOUS

## 2012-04-13 MED ORDER — CEFAZOLIN SODIUM 1-5 GM-% IV SOLN: 1.0000 g | INTRAVENOUS | Status: DC

## 2012-04-13 MED ORDER — ATENOLOL 25 MG PO TABS
25.0000 mg | ORAL_TABLET | ORAL | Status: AC
  Administered 2012-04-13: 25 mg via ORAL
  Filled 2012-04-13: qty 1

## 2012-04-13 MED ORDER — COLESEVELAM HCL 625 MG PO TABS
1875.0000 mg | ORAL_TABLET | Freq: Two times a day (BID) | ORAL | Status: DC
  Administered 2012-04-13 – 2012-04-19 (×11): 1875 mg via ORAL
  Filled 2012-04-13 (×13): qty 3

## 2012-04-13 MED ORDER — DROPERIDOL 2.5 MG/ML IJ SOLN: 0.6250 mg | INTRAMUSCULAR | Status: DC | PRN

## 2012-04-13 MED ORDER — ATENOLOL 25 MG PO TABS
25.0000 mg | ORAL_TABLET | Freq: Every day | ORAL | Status: DC
  Administered 2012-04-14 – 2012-04-19 (×6): 25 mg via ORAL
  Filled 2012-04-13 (×7): qty 1

## 2012-04-13 MED ORDER — METFORMIN HCL ER 500 MG PO TB24: 1000.0000 mg | ORAL_TABLET | Freq: Two times a day (BID) | ORAL | Status: DC

## 2012-04-13 MED ORDER — PIPERACILLIN-TAZOBACTAM 3.375 G IVPB
3.3750 g | Freq: Three times a day (TID) | INTRAVENOUS | Status: DC
  Administered 2012-04-13 – 2012-04-14 (×2): 3.375 g via INTRAVENOUS
  Filled 2012-04-13 (×4): qty 50

## 2012-04-13 MED ORDER — ASPIRIN EC 81 MG PO TBEC
81.0000 mg | DELAYED_RELEASE_TABLET | Freq: Every day | ORAL | Status: DC
  Administered 2012-04-13 – 2012-04-19 (×6): 81 mg via ORAL
  Filled 2012-04-13 (×7): qty 1

## 2012-04-13 MED ORDER — PIPERACILLIN-TAZOBACTAM 3.375 G IVPB 30 MIN
INTRAVENOUS | Status: DC | PRN
  Administered 2012-04-13: 3.375 g via INTRAVENOUS

## 2012-04-13 MED ORDER — FENTANYL CITRATE 0.05 MG/ML IJ SOLN
INTRAMUSCULAR | Status: AC
  Filled 2012-04-13: qty 2

## 2012-04-13 MED ORDER — ONDANSETRON HCL 4 MG/2ML IJ SOLN
4.0000 mg | Freq: Four times a day (QID) | INTRAMUSCULAR | Status: DC | PRN
  Administered 2012-04-15 – 2012-04-17 (×2): 4 mg via INTRAVENOUS
  Filled 2012-04-13 (×2): qty 2

## 2012-04-13 MED ORDER — METFORMIN HCL ER 500 MG PO TB24
1000.0000 mg | ORAL_TABLET | Freq: Two times a day (BID) | ORAL | Status: DC
  Administered 2012-04-13 – 2012-04-17 (×8): 1000 mg via ORAL
  Filled 2012-04-13 (×9): qty 2

## 2012-04-13 MED ORDER — METOCLOPRAMIDE HCL 5 MG/ML IJ SOLN: 5.0000 mg | Freq: Three times a day (TID) | INTRAMUSCULAR | Status: DC | PRN

## 2012-04-13 MED ORDER — SODIUM CHLORIDE 0.9 % IJ SOLN: 10.0000 mL | Freq: Two times a day (BID) | INTRAMUSCULAR | Status: DC

## 2012-04-13 MED ORDER — MIDAZOLAM HCL 5 MG/5ML IJ SOLN
INTRAMUSCULAR | Status: DC | PRN
  Administered 2012-04-13 (×2): 1 mg via INTRAVENOUS

## 2012-04-13 MED ORDER — VITAMIN B-12 500 MCG PO TABS: 500.0000 ug | ORAL_TABLET | Freq: Two times a day (BID) | ORAL | Status: DC

## 2012-04-13 MED ORDER — POTASSIUM CHLORIDE CRYS ER 20 MEQ PO TBCR
20.0000 meq | EXTENDED_RELEASE_TABLET | ORAL | Status: DC
  Administered 2012-04-14: 20 meq via ORAL
  Filled 2012-04-13 (×3): qty 1

## 2012-04-13 MED ORDER — DOCUSATE SODIUM 100 MG PO CAPS: 100.0000 mg | ORAL_CAPSULE | Freq: Two times a day (BID) | ORAL | Status: DC

## 2012-04-13 MED ORDER — FUROSEMIDE 40 MG PO TABS
40.0000 mg | ORAL_TABLET | Freq: Two times a day (BID) | ORAL | Status: DC
  Administered 2012-04-13 – 2012-04-19 (×10): 40 mg via ORAL
  Filled 2012-04-13 (×15): qty 1

## 2012-04-13 MED ORDER — FLUTICASONE PROPIONATE 50 MCG/ACT NA SUSP
2.0000 | Freq: Every day | NASAL | Status: DC
  Administered 2012-04-15 – 2012-04-17 (×3): 2 via NASAL
  Filled 2012-04-13 (×2): qty 16

## 2012-04-13 MED ORDER — MIDAZOLAM HCL 2 MG/2ML IJ SOLN
INTRAMUSCULAR | Status: AC
  Filled 2012-04-13: qty 2

## 2012-04-13 MED ORDER — HYDROMORPHONE HCL PF 1 MG/ML IJ SOLN: 0.2500 mg | INTRAMUSCULAR | Status: DC | PRN

## 2012-04-13 MED ORDER — VITAMIN B-12 1000 MCG PO TABS
1000.0000 ug | ORAL_TABLET | Freq: Every day | ORAL | Status: DC
  Administered 2012-04-13 – 2012-04-19 (×6): 1000 ug via ORAL
  Filled 2012-04-13 (×7): qty 1

## 2012-04-13 MED ORDER — SODIUM CHLORIDE 0.9 % IJ SOLN
10.0000 mL | INTRAMUSCULAR | Status: DC | PRN
  Administered 2012-04-13: 10 mL

## 2012-04-13 MED ORDER — INSULIN ASPART 100 UNIT/ML ~~LOC~~ SOLN
0.0000 [IU] | Freq: Three times a day (TID) | SUBCUTANEOUS | Status: DC
  Administered 2012-04-14 – 2012-04-15 (×4): 5 [IU] via SUBCUTANEOUS
  Administered 2012-04-15: 3 [IU] via SUBCUTANEOUS
  Administered 2012-04-15: 5 [IU] via SUBCUTANEOUS
  Administered 2012-04-16: 8 [IU] via SUBCUTANEOUS
  Administered 2012-04-16: 5 [IU] via SUBCUTANEOUS

## 2012-04-13 MED ORDER — METOPROLOL TARTRATE 12.5 MG HALF TABLET
ORAL_TABLET | ORAL | Status: AC
  Filled 2012-04-13: qty 2

## 2012-04-13 MED ORDER — LACTATED RINGERS IV SOLN
INTRAVENOUS | Status: DC | PRN
  Administered 2012-04-13: 14:00:00 via INTRAVENOUS

## 2012-04-13 MED ORDER — CHLORHEXIDINE GLUCONATE 4 % EX LIQD: 60.0000 mL | Freq: Once | CUTANEOUS | Status: DC

## 2012-04-13 MED ORDER — DOCUSATE SODIUM 100 MG PO CAPS
100.0000 mg | ORAL_CAPSULE | Freq: Two times a day (BID) | ORAL | Status: DC
  Administered 2012-04-13 – 2012-04-19 (×11): 100 mg via ORAL
  Filled 2012-04-13 (×13): qty 1

## 2012-04-13 MED ORDER — METOCLOPRAMIDE HCL 10 MG PO TABS
5.0000 mg | ORAL_TABLET | Freq: Three times a day (TID) | ORAL | Status: DC | PRN
  Administered 2012-04-16: 10 mg via ORAL

## 2012-04-13 MED ORDER — SODIUM CHLORIDE 0.9 % IV SOLN: INTRAVENOUS | Status: DC

## 2012-04-13 MED ORDER — DSS 100 MG PO CAPS: 100.0000 mg | ORAL_CAPSULE | Freq: Two times a day (BID) | ORAL | Status: DC

## 2012-04-13 MED ORDER — PIPERACILLIN-TAZOBACTAM 3.375 G IVPB
3.3750 g | Freq: Once | INTRAVENOUS | Status: DC
  Filled 2012-04-13 (×2): qty 50

## 2012-04-13 MED ORDER — MUPIROCIN 2 % EX OINT
TOPICAL_OINTMENT | Freq: Once | CUTANEOUS | Status: AC
  Administered 2012-04-13: 1 via NASAL

## 2012-04-13 MED ORDER — ONDANSETRON HCL 4 MG PO TABS
4.0000 mg | ORAL_TABLET | Freq: Four times a day (QID) | ORAL | Status: DC | PRN
  Filled 2012-04-13: qty 1

## 2012-04-13 MED ORDER — OXYCODONE HCL 5 MG PO TABS
5.0000 mg | ORAL_TABLET | ORAL | Status: DC | PRN
  Administered 2012-04-15 – 2012-04-16 (×5): 10 mg via ORAL
  Administered 2012-04-17: 5 mg via ORAL
  Administered 2012-04-17 (×4): 10 mg via ORAL
  Administered 2012-04-18: 5 mg via ORAL
  Administered 2012-04-18: 10 mg via ORAL
  Filled 2012-04-13: qty 2
  Filled 2012-04-13: qty 1
  Filled 2012-04-13 (×9): qty 2

## 2012-04-13 MED ORDER — SODIUM CHLORIDE 0.9 % IR SOLN
Status: DC | PRN
  Administered 2012-04-13: 3000 mL

## 2012-04-13 MED ORDER — CEFAZOLIN SODIUM-DEXTROSE 2-3 GM-% IV SOLR: 2.0000 g | INTRAVENOUS | Status: DC

## 2012-04-13 MED ORDER — PROPOFOL 10 MG/ML IV EMUL
INTRAVENOUS | Status: DC | PRN
  Administered 2012-04-13: 50 ug/kg/min via INTRAVENOUS

## 2012-04-13 MED ORDER — MIDAZOLAM HCL 2 MG/2ML IJ SOLN
2.0000 mg | INTRAMUSCULAR | Status: DC | PRN
  Administered 2012-04-13: 1 mg via INTRAVENOUS

## 2012-04-13 MED ORDER — VITAMIN C 500 MG PO TABS
500.0000 mg | ORAL_TABLET | Freq: Three times a day (TID) | ORAL | Status: DC
  Administered 2012-04-13 – 2012-04-19 (×15): 500 mg via ORAL
  Filled 2012-04-13 (×21): qty 1

## 2012-04-13 MED ORDER — OXYCODONE HCL 5 MG PO TABS
5.0000 mg | ORAL_TABLET | ORAL | Status: DC | PRN
  Administered 2012-04-14 – 2012-04-19 (×8): 10 mg via ORAL
  Filled 2012-04-13 (×11): qty 2

## 2012-04-13 MED ORDER — PIPERACILLIN-TAZOBACTAM 3.375 G IVPB: 3.3750 g | Freq: Three times a day (TID) | INTRAVENOUS | Status: DC

## 2012-04-13 MED ORDER — LACTATED RINGERS IV SOLN: INTRAVENOUS | Status: DC

## 2012-04-13 SURGICAL SUPPLY — 59 items
BANDAGE ELASTIC 4 VELCRO ST LF (GAUZE/BANDAGES/DRESSINGS) ×1 IMPLANT
BANDAGE ESMARK 6X9 LF (GAUZE/BANDAGES/DRESSINGS) ×1 IMPLANT
BLADE SURG 10 STRL SS (BLADE) ×2 IMPLANT
BNDG CMPR 9X6 STRL LF SNTH (GAUZE/BANDAGES/DRESSINGS)
BNDG COHESIVE 4X5 TAN STRL (GAUZE/BANDAGES/DRESSINGS) ×1 IMPLANT
BNDG COHESIVE 6X5 TAN STRL LF (GAUZE/BANDAGES/DRESSINGS) ×1 IMPLANT
BNDG ESMARK 6X9 LF (GAUZE/BANDAGES/DRESSINGS)
CANISTER WOUND CARE 500ML ATS (WOUND CARE) ×1 IMPLANT
CHLORAPREP W/TINT 26ML (MISCELLANEOUS) ×2 IMPLANT
CLOTH BEACON ORANGE TIMEOUT ST (SAFETY) ×2 IMPLANT
CONT SPEC 4OZ CLIKSEAL STRL BL (MISCELLANEOUS) ×1 IMPLANT
COVER SURGICAL LIGHT HANDLE (MISCELLANEOUS) ×2 IMPLANT
CUFF TOURNIQUET SINGLE 34IN LL (TOURNIQUET CUFF) ×1 IMPLANT
CUFF TOURNIQUET SINGLE 44IN (TOURNIQUET CUFF) IMPLANT
DRAPE SURG 17X23 STRL (DRAPES) ×1 IMPLANT
DRAPE U-SHAPE 47X51 STRL (DRAPES) ×1 IMPLANT
DRSG ADAPTIC 3X8 NADH LF (GAUZE/BANDAGES/DRESSINGS) ×1 IMPLANT
DRSG PAD ABDOMINAL 8X10 ST (GAUZE/BANDAGES/DRESSINGS) IMPLANT
DRSG VAC ATS SM SENSATRAC (GAUZE/BANDAGES/DRESSINGS) ×2 IMPLANT
ELECT REM PT RETURN 9FT ADLT (ELECTROSURGICAL) ×2
ELECTRODE REM PT RTRN 9FT ADLT (ELECTROSURGICAL) ×1 IMPLANT
FLUID NSS /IRRIG 3000 ML XXX (IV SOLUTION) ×1 IMPLANT
GAUZE XEROFORM 5X9 LF (GAUZE/BANDAGES/DRESSINGS) ×1 IMPLANT
GLOVE BIO SURGEON STRL SZ8 (GLOVE) ×2 IMPLANT
GLOVE BIOGEL PI IND STRL 6.5 (GLOVE) IMPLANT
GLOVE BIOGEL PI IND STRL 7.0 (GLOVE) IMPLANT
GLOVE BIOGEL PI IND STRL 8 (GLOVE) ×1 IMPLANT
GLOVE BIOGEL PI INDICATOR 6.5 (GLOVE) ×2
GLOVE BIOGEL PI INDICATOR 7.0 (GLOVE) ×2
GLOVE BIOGEL PI INDICATOR 8 (GLOVE) ×1
GLOVE SURG SS PI 6.5 STRL IVOR (GLOVE) ×1 IMPLANT
GOWN PREVENTION PLUS XLARGE (GOWN DISPOSABLE) ×2 IMPLANT
GOWN STRL NON-REIN LRG LVL3 (GOWN DISPOSABLE) ×5 IMPLANT
KIT BASIN OR (CUSTOM PROCEDURE TRAY) ×2 IMPLANT
KIT ROOM TURNOVER OR (KITS) ×2 IMPLANT
MANIFOLD NEPTUNE II (INSTRUMENTS) ×2 IMPLANT
PACK ORTHO EXTREMITY (CUSTOM PROCEDURE TRAY) ×2 IMPLANT
PAD ARMBOARD 7.5X6 YLW CONV (MISCELLANEOUS) ×4 IMPLANT
PAD CAST 4YDX4 CTTN HI CHSV (CAST SUPPLIES) ×1 IMPLANT
PADDING CAST ABS 4INX4YD NS (CAST SUPPLIES) ×1
PADDING CAST ABS COTTON 4X4 ST (CAST SUPPLIES) IMPLANT
PADDING CAST COTTON 4X4 STRL (CAST SUPPLIES)
SOAP 2 % CHG 4 OZ (WOUND CARE) ×2 IMPLANT
SPONGE GAUZE 4X4 12PLY (GAUZE/BANDAGES/DRESSINGS) IMPLANT
STAPLER VISISTAT 35W (STAPLE) IMPLANT
SUCTION FRAZIER TIP 10 FR DISP (SUCTIONS) ×2 IMPLANT
SUT ETHILON 3 0 FSL (SUTURE) IMPLANT
SUT PROLENE 3 0 PS 2 (SUTURE) ×1 IMPLANT
SUT VIC AB 2-0 CT1 27 (SUTURE)
SUT VIC AB 2-0 CT1 TAPERPNT 27 (SUTURE) ×2 IMPLANT
SUT VIC AB 3-0 PS2 18 (SUTURE)
SUT VIC AB 3-0 PS2 18XBRD (SUTURE) ×1 IMPLANT
SWAB COLLECTION DEVICE MRSA (MISCELLANEOUS) ×1 IMPLANT
TOWEL OR 17X24 6PK STRL BLUE (TOWEL DISPOSABLE) ×2 IMPLANT
TOWEL OR 17X26 10 PK STRL BLUE (TOWEL DISPOSABLE) ×2 IMPLANT
TUBE ANAEROBIC SPECIMEN COL (MISCELLANEOUS) ×1 IMPLANT
TUBE CONNECTING 12X1/4 (SUCTIONS) ×2 IMPLANT
WATER STERILE IRR 1000ML POUR (IV SOLUTION) ×2 IMPLANT
YANKAUER SUCT BULB TIP NO VENT (SUCTIONS) ×2 IMPLANT

## 2012-04-13 NOTE — Anesthesia Postprocedure Evaluation (Signed)
  Anesthesia Post-op Note  Patient: Anthony Holder  Procedure(s) Performed: Procedure(s) (LRB): IRRIGATION AND DEBRIDEMENT EXTREMITY (Left)  Patient Location: PACU  Anesthesia Type: General  Level of Consciousness: awake and alert   Airway and Oxygen Therapy: Patient Spontanous Breathing and Patient connected to nasal cannula oxygen  Post-op Pain: mild  Post-op Assessment: Post-op Vital signs reviewed  Post-op Vital Signs: Reviewed  Complications: No apparent anesthesia complications
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
## 2012-04-13 NOTE — Brief Op Note (Signed)
04/13/2012  3:54 PM  PATIENT:  Anthony Holder  71 y.o. male  PRE-OPERATIVE DIAGNOSIS:  left foot abscess  POST-OPERATIVE DIAGNOSIS:  left foot abscess, diabetic foot wound  Procedure(s): 1.  Irrigation and debridement left foot abscess deep to the plantar fascia 2.  Application of wound vac to left foot wounds  SURGEON:  Toni Arthurs, MD  ASSISTANT: n/a  ANESTHESIA:   Ankle block, MAC  EBL:  minimal   TOURNIQUET:  N/a  Specimen:  Deep tissue to micro for cx  COMPLICATIONS:  None apparent  DISPOSITION:  Extubated, awake and stable to recovery.  DICTATION ID:  [id number]

## 2012-04-13 NOTE — Anesthesia Procedure Notes (Signed)
Anesthesia Regional Block:  Popliteal block  Pre-Anesthetic Checklist: ,, timeout performed, Correct Patient, Correct Site, Correct Laterality, Correct Procedure,, site marked, risks and benefits discussed, Surgical consent,  Pre-op evaluation,  At surgeon's request and post-op pain management  Laterality: Left  Prep: chloraprep       Needles:  Injection technique: Single-shot  Needle Type: Echogenic Stimulator Needle     Needle Length: 10cm 10 cm Needle Gauge: 21 G    Additional Needles:  Procedures: ultrasound guided and nerve stimulator Popliteal block  Nerve Stimulator or Paresthesia:  Response: plantar flexion, 0.45 mA,   Additional Responses:   Narrative:  Start time: 04/13/2012 2:01 PM End time: 04/13/2012 2:13 PM Injection made incrementally with aspirations every 5 mL.  Performed by: Personally  Anesthesiologist: J. Adonis Huguenin, MD  Additional Notes: A functioning IV was confirmed and monitors were applied.  Sterile prep and drape, hand hygiene and sterile gloves were used.  Negative aspiration and test dose prior to incremental administration of local anesthetic. The patient tolerated the procedure well.Ultrasound  guidance: relevant anatomy identified, needle position confirmed, local anesthetic spread visualized around nerve(s), vascular puncture avoided.  Image printed for medical record.   Popliteal block
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
## 2012-04-13 NOTE — Preoperative (Signed)
Beta Blockers   Reason not to administer Beta Blockers:Not Applicable 

## 2012-04-13 NOTE — Plan of Care (Signed)
Problem: Diagnosis - Type of Surgery Goal: General Surgical Patient Education (See Patient Education module for education specifics) Outcome: Completed/Met Date Met:  04/13/12 L foot I&D with VAC placement

## 2012-04-13 NOTE — Anesthesia Preprocedure Evaluation (Addendum)
Anesthesia Evaluation  Patient identified by MRN, date of birth, ID band Patient awake    Reviewed: Allergy & Precautions, H&P , NPO status , Patient's Chart, lab work & pertinent test results, reviewed documented beta blocker date and time   History of Anesthesia Complications Negative for: history of anesthetic complications  Airway Mallampati: II TM Distance: >3 FB     Dental  (+) Edentulous Upper, Edentulous Lower and Dental Advisory Given   Pulmonary asthma ,  breath sounds clear to auscultation        Cardiovascular hypertension, Pt. on medications + CAD and +CHF Rhythm:Regular Rate:Normal + Systolic murmurs Mild -Mod AS, normal EF by echo 2011   Neuro/Psych CVA, Residual Symptoms    GI/Hepatic negative GI ROS, Neg liver ROS,   Endo/Other  Diabetes mellitus-, Poorly Controlled, Type 2, Insulin Dependent  Renal/GU negative Renal ROS     Musculoskeletal   Abdominal   Peds  Hematology   Anesthesia Other Findings   Reproductive/Obstetrics                         Anesthesia Physical Anesthesia Plan  ASA: III  Anesthesia Plan: MAC   Post-op Pain Management:    Induction:   Airway Management Planned: Simple Face Mask  Additional Equipment:   Intra-op Plan:   Post-operative Plan:   Informed Consent: I have reviewed the patients History and Physical, chart, labs and discussed the procedure including the risks, benefits and alternatives for the proposed anesthesia with the patient or authorized representative who has indicated his/her understanding and acceptance.   Dental advisory given  Plan Discussed with: CRNA, Anesthesiologist and Surgeon  Anesthesia Plan Comments:         Anesthesia Quick Evaluation

## 2012-04-13 NOTE — Transfer of Care (Signed)
Immediate Anesthesia Transfer of Care Note  Patient: Anthony Holder  Procedure(s) Performed: Procedure(s) (LRB): IRRIGATION AND DEBRIDEMENT EXTREMITY (Left)  Patient Location: PACU  Anesthesia Type: MAC combined with regional for post-op pain  Level of Consciousness: awake, alert  and oriented  Airway & Oxygen Therapy: Patient connected to nasal cannula oxygen  Post-op Assessment: Report given to PACU RN, Post -op Vital signs reviewed and stable and Patient moving all extremities X 4  Post vital signs: Reviewed and stable  Complications: No apparent anesthesia complications

## 2012-04-13 NOTE — Progress Notes (Signed)
In epic ECHO 2011. Note from Dr. Antoine Poche in 2010

## 2012-04-14 ENCOUNTER — Encounter (HOSPITAL_COMMUNITY): Payer: Self-pay | Admitting: Orthopedic Surgery

## 2012-04-14 DIAGNOSIS — E1169 Type 2 diabetes mellitus with other specified complication: Secondary | ICD-10-CM

## 2012-04-14 DIAGNOSIS — M9080 Osteopathy in diseases classified elsewhere, unspecified site: Secondary | ICD-10-CM

## 2012-04-14 DIAGNOSIS — M869 Osteomyelitis, unspecified: Secondary | ICD-10-CM

## 2012-04-14 LAB — BASIC METABOLIC PANEL
Chloride: 99 mEq/L (ref 96–112)
GFR calc Af Amer: >90 mL/min (ref >90)
GFR calc non Af Amer: 83 mL/min — ABNORMAL LOW (ref >90)
Potassium: 4.4 mEq/L (ref 3.5–5.1)

## 2012-04-14 LAB — CBC
MCHC: 32.7 g/dL (ref 30.0–36.0)
Platelets: 352 10*3/uL (ref 150–400)
RDW: 13.7 % (ref 11.5–15.5)
WBC: 14.6 10*3/uL — ABNORMAL HIGH (ref 4.0–10.5)

## 2012-04-14 LAB — GLUCOSE, CAPILLARY: Glucose-Capillary: 230 mg/dL — ABNORMAL HIGH (ref 70–99)

## 2012-04-14 MED ORDER — INSULIN GLARGINE 100 UNIT/ML ~~LOC~~ SOLN
20.0000 [IU] | Freq: Every day | SUBCUTANEOUS | Status: DC
  Administered 2012-04-14 – 2012-04-17 (×4): 20 [IU] via SUBCUTANEOUS

## 2012-04-14 MED ORDER — GABAPENTIN 100 MG PO CAPS
100.0000 mg | ORAL_CAPSULE | Freq: Three times a day (TID) | ORAL | Status: DC
  Administered 2012-04-14 (×2): 100 mg via ORAL
  Filled 2012-04-14 (×7): qty 1

## 2012-04-14 MED ORDER — DEXTROSE 5 % IV SOLN
2.0000 g | Freq: Three times a day (TID) | INTRAVENOUS | Status: DC
  Administered 2012-04-14 – 2012-04-16 (×6): 2 g via INTRAVENOUS
  Filled 2012-04-14 (×10): qty 2

## 2012-04-14 MED ORDER — SODIUM CHLORIDE 0.9 % IV SOLN
INTRAVENOUS | Status: DC
  Administered 2012-04-14: 21:00:00 via INTRAVENOUS

## 2012-04-14 MED ORDER — METRONIDAZOLE 500 MG PO TABS
500.0000 mg | ORAL_TABLET | Freq: Three times a day (TID) | ORAL | Status: DC
  Administered 2012-04-14 – 2012-04-16 (×6): 500 mg via ORAL
  Filled 2012-04-14 (×9): qty 1

## 2012-04-14 MED ORDER — LIVING WELL WITH DIABETES BOOK
Freq: Once | Status: AC
  Administered 2012-04-14: 15:00:00
  Filled 2012-04-14: qty 1

## 2012-04-14 NOTE — Op Note (Signed)
NAME:  Anthony Holder, Anthony Holder NO.:  MEDICAL RECORD NO.:  [id number]  LOCATION:                                 FACILITY:  PHYSICIAN:  Toni Arthurs, MD        DATE OF BIRTH:  1941/11/19  DATE OF PROCEDURE:  04/13/2012 DATE OF DISCHARGE:                              OPERATIVE REPORT   PREOPERATIVE DIAGNOSIS:  Left foot abscess.  POSTOPERATIVE DIAGNOSES: 1. Left foot abscess. 2. Left diabetic foot wound.  PROCEDURE: 1. Irrigation and debridement of left foot abscess, deep to the     plantar fascia. 2. Application of wound VAC to left foot wounds.  SURGEON:  Toni Arthurs, MD  ANESTHESIA:  Ankle block, MAC.  ESTIMATED BLOOD LOSS:  Minimal.  SPECIMEN:  Deep tissue to Microbiology for culture.  COMPLICATIONS:  None apparent.  DISPOSITION:  Extubated, awake and stable to recovery.  INDICATIONS FOR PROCEDURE:  The patient is a 71 year old male with past medical history significant for diabetes and peripheral neuropathy.  He is 2 weeks status post left second toe amputation for gangrene.  He had delayed healing of his wound and then developed purulent drainage.  He was seen in the Wound Clinic yesterday and pus was noted to be draining from the plantar surface of the arch of his foot.  He then presented to my office where a new wound was noted on the plantar surface of the foot with purulent drainage.  He presents now for irrigation and debridement of this deep abscess.  He understands risks and benefits, the alternative treatment options, and elects surgical treatment.  He specifically understands risks of bleeding, infection, nerve damage, blood clots, need for additional surgery, revision amputation, and death.  PROCEDURE IN DETAIL:  After preoperative consent was obtained and correct operative site was identified, the patient was brought to the operating room and placed supine on the operating table.  Ankle block and MAC anesthesia were administered.   The patient was already on therapeutic Zosyn 3 times a day.  Surgical time-out was taken.  The left lower extremity was prepped and draped in standard sterile fashion.  The patient's previous second toe amputation wound was noted to have some purulent material and significant necrosis around the wound edges as well as fibrinous exudate.  This wound was debrided circumferentially from the level of the skin down to the level of the bone, taking care to remove all necrotic material.  The head of the metatarsal was resected with a bone cutter.  The flexor tendon sheath on the plantar surface of the hallux was noted to track to the second ulcer noted in the plantar aspect of the arch.  The wound edges were circumferentially excised. The wound was extended proximally to the level of another small lesion. The plantar fascia was evident in the depths of the wound. Circumferential debridement was carried from the level of the skin down to the level of the plantar aspect of the midfoot bones.  Another mL or two of purulent material was again noted.  Deep tissue was obtained and sent as a specimen to Microbiology for culture.  Both wounds were then irrigated copiously.  The tissue quality was deemed adequate, but was noted to not be particularly well perfused at either wound.  Wound VAC sponges were cut to fit both wounds and a bridge was made between the 2 wounds.  The occlusive dressings were applied, sealing the wound VAC sponges.  The vacuum tubing was applied and the dressings were noted seal appropriately.  Sterile dressings were applied along with a compression wrap.  The patient was then awakened from anesthesia and transported to the recovery room in stable condition.  FOLLOWUP PLAN:  The patient will need a return to the operating room for repeat I and D and wound VAC change in a few days.  He will be admitted and continued on IV Zosyn.     Toni Arthurs, MD     JH/MEDQ  D:   04/13/2012  T:  04/13/2012  Job:  [id number]
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
## 2012-04-14 NOTE — Progress Notes (Signed)
Subjective: 1 Day Post-Op Procedure(s) (LRB): IRRIGATION AND DEBRIDEMENT EXTREMITY (Left) Patient reports pain as moderate.  Shooting pains in foot.  No n/v/f/c.  Objective: Vital signs in last 24 hours: Temp:  [97.5 F (36.4 C)-98.8 F (37.1 C)] 98.3 F (36.8 C) (04/26 1400) Pulse Rate:  [71-94] 94  (04/26 1400) Resp:  [16-20] 20  (04/26 1400) BP: (121-149)/(61-81) 131/77 mmHg (04/26 1400) SpO2:  [95 %-100 %] 97 % (04/26 1400)  Intake/Output from previous day: 04/25 0701 - 04/26 0700 In: 800 [I.V.:800] Out: 300 [Urine:300] Intake/Output this shift: Total I/O In: 600 [P.O.:600] Out: 900 [Urine:900]   Basename 04/14/12 0630 04/12/12 2021 04/12/12 2014  HGB 9.8* 11.6* 11.1*    Basename 04/14/12 0630 04/12/12 2021 04/12/12 2014  WBC 14.6* -- 12.7*  RBC 3.55* -- 4.03*  HCT 30.0* 34.0* --  PLT 352 -- 438*    Basename 04/14/12 0630 04/12/12 2021  NA 131* 136  K 4.4 4.1  CL 99 104  CO2 20 --  BUN 11 14  CREATININE 0.95 1.20  GLUCOSE 197* 160*  CALCIUM 8.6 --   No results found for this basename: LABPT:2,INR:2 in the last 72 hours  foot dressed and dry.  Assessment/Plan: 1 Day Post-Op Procedure(s) (LRB): IRRIGATION AND DEBRIDEMENT EXTREMITY (Left) Will continue IV abx and await culture results.  Continue W[place].  To OR tomorrow for repeat I and D and WV change.  Toni Arthurs 04/14/2012, 4:25 PM

## 2012-04-14 NOTE — Progress Notes (Signed)
04/14/12  Spoke with patient about his diabetes.  Has had for 20 to 25 years.  Sees Dr. Oneta Rack as PCP.  States that he takes Metformin twice a day and Lantus insulin 20 units daily if CBGs 150-200 mg/dl;Lantus  35 units if CBGs greater than 200 mg/dl.  States does not eat much food during day; says that "only sardines and lettuce keep his blood sugar down" Will order Living Well with Diabetes booklet and have staff nurse to give patient [facility] exit notes at discharge. Patient is not interested in going to [facility] outpatient education.  Smith Mince RN [name]

## 2012-04-14 NOTE — Progress Notes (Signed)
Inpatient Diabetes Program Recommendations  AACE/ADA: New Consensus Statement on Inpatient Glycemic Control (2009)  Target Ranges:  Prepandial:   less than 140 mg/dL      Peak postprandial:   less than 180 mg/dL (1-2 hours)      Critically ill patients:  140 - 180 mg/dL   Reason for Visit: Patient takes Lantus at home.  Takes 20 units if CBG 150-200 mg/dl.  35 units if CBG greater than 200 mg/dl.  Inpatient Diabetes Program Recommendations Insulin - Basal: .Start Lantus 20 units daily.  Insulin - Meal Coverage: . Outpatient Referral: .  Note: CBGs on 04/14/12  230-233 mg/dl

## 2012-04-15 ENCOUNTER — Inpatient Hospital Stay (HOSPITAL_COMMUNITY): Payer: Medicare Other | Admitting: Critical Care Medicine

## 2012-04-15 ENCOUNTER — Encounter (HOSPITAL_COMMUNITY)
Admission: RE | Disposition: A | Discharge status: Home / Self Care | Payer: Self-pay | Source: Ambulatory Visit | Attending: Orthopedic Surgery

## 2012-04-15 ENCOUNTER — Encounter (HOSPITAL_COMMUNITY): Payer: Self-pay | Admitting: Critical Care Medicine

## 2012-04-15 HISTORY — PX: I & D EXTREMITY: SHX5045

## 2012-04-15 LAB — C-REACTIVE PROTEIN: CRP: 14.67 mg/dL — ABNORMAL HIGH (ref <0.60)

## 2012-04-15 LAB — GLUCOSE, CAPILLARY
Glucose-Capillary: 211 mg/dL — ABNORMAL HIGH (ref 70–99)
Glucose-Capillary: 218 mg/dL — ABNORMAL HIGH (ref 70–99)

## 2012-04-15 LAB — HIV ANTIBODY (ROUTINE TESTING W REFLEX): HIV: NONREACTIVE

## 2012-04-15 SURGERY — IRRIGATION AND DEBRIDEMENT EXTREMITY
Anesthesia: General | Site: Foot | Laterality: Left | Wound class: Dirty or Infected

## 2012-04-15 MED ORDER — EPHEDRINE SULFATE 50 MG/ML IJ SOLN
INTRAMUSCULAR | Status: DC | PRN
  Administered 2012-04-15: 5 mg via INTRAVENOUS

## 2012-04-15 MED ORDER — ONDANSETRON HCL 4 MG/2ML IJ SOLN: 4.0000 mg | Freq: Once | INTRAMUSCULAR | Status: AC | PRN

## 2012-04-15 MED ORDER — FENTANYL CITRATE 0.05 MG/ML IJ SOLN
INTRAMUSCULAR | Status: DC | PRN
  Administered 2012-04-15 (×4): 50 ug via INTRAVENOUS

## 2012-04-15 MED ORDER — HYDROMORPHONE HCL PF 1 MG/ML IJ SOLN
0.2500 mg | INTRAMUSCULAR | Status: DC | PRN
  Administered 2012-04-15 – 2012-04-18 (×4): 0.5 mg via INTRAVENOUS
  Filled 2012-04-15: qty 1

## 2012-04-15 MED ORDER — PROPOFOL 10 MG/ML IV EMUL
INTRAVENOUS | Status: DC | PRN
  Administered 2012-04-15: 130 mg via INTRAVENOUS

## 2012-04-15 MED ORDER — INSULIN ASPART 100 UNIT/ML ~~LOC~~ SOLN
SUBCUTANEOUS | Status: AC
  Filled 2012-04-15: qty 1

## 2012-04-15 MED ORDER — ONDANSETRON HCL 4 MG/2ML IJ SOLN
4.0000 mg | Freq: Once | INTRAMUSCULAR | Status: AC | PRN
  Filled 2012-04-15: qty 2

## 2012-04-15 MED ORDER — ONDANSETRON HCL 4 MG/2ML IJ SOLN
INTRAMUSCULAR | Status: DC | PRN
  Administered 2012-04-15: 4 mg via INTRAVENOUS

## 2012-04-15 MED ORDER — LACTATED RINGERS IV SOLN
INTRAVENOUS | Status: DC | PRN
  Administered 2012-04-15 (×2): via INTRAVENOUS

## 2012-04-15 MED ORDER — INSULIN ASPART 100 UNIT/ML ~~LOC~~ SOLN
SUBCUTANEOUS | Status: DC | PRN
  Administered 2012-04-15: 5 [IU] via SUBCUTANEOUS

## 2012-04-15 MED ORDER — GABAPENTIN 100 MG PO CAPS
200.0000 mg | ORAL_CAPSULE | Freq: Three times a day (TID) | ORAL | Status: DC
  Administered 2012-04-15 – 2012-04-19 (×13): 200 mg via ORAL
  Filled 2012-04-15 (×15): qty 2

## 2012-04-15 MED ORDER — HYDROMORPHONE HCL PF 1 MG/ML IJ SOLN
0.2500 mg | INTRAMUSCULAR | Status: DC | PRN
  Administered 2012-04-15 – 2012-04-18 (×4): 0.5 mg via INTRAVENOUS
  Filled 2012-04-15 (×5): qty 1

## 2012-04-15 MED ORDER — LIDOCAINE HCL (CARDIAC) 20 MG/ML IV SOLN
INTRAVENOUS | Status: DC | PRN
  Administered 2012-04-15: 20 mg via INTRAVENOUS

## 2012-04-15 MED ORDER — SODIUM CHLORIDE 0.9 % IR SOLN
Status: DC | PRN
  Administered 2012-04-15: 3000 mL

## 2012-04-15 MED ORDER — INSULIN GLARGINE 100 UNIT/ML ~~LOC~~ SOLN
15.0000 [IU] | Freq: Every day | SUBCUTANEOUS | Status: DC
  Administered 2012-04-15: 15 [IU] via SUBCUTANEOUS

## 2012-04-15 SURGICAL SUPPLY — 46 items
BANDAGE ESMARK 6X9 LF (GAUZE/BANDAGES/DRESSINGS) ×1 IMPLANT
BLADE SURG 10 STRL SS (BLADE) ×2 IMPLANT
BNDG CMPR 9X6 STRL LF SNTH (GAUZE/BANDAGES/DRESSINGS) ×1
BNDG COHESIVE 4X5 TAN STRL (GAUZE/BANDAGES/DRESSINGS) ×2 IMPLANT
BNDG COHESIVE 6X5 TAN STRL LF (GAUZE/BANDAGES/DRESSINGS) ×2 IMPLANT
BNDG ESMARK 6X9 LF (GAUZE/BANDAGES/DRESSINGS) ×2
CHLORAPREP W/TINT 26ML (MISCELLANEOUS) ×2 IMPLANT
CLOTH BEACON ORANGE TIMEOUT ST (SAFETY) ×2 IMPLANT
COVER SURGICAL LIGHT HANDLE (MISCELLANEOUS) ×2 IMPLANT
CUFF TOURNIQUET SINGLE 34IN LL (TOURNIQUET CUFF) ×2 IMPLANT
CUFF TOURNIQUET SINGLE 44IN (TOURNIQUET CUFF) IMPLANT
DRAPE U-SHAPE 47X51 STRL (DRAPES) ×2 IMPLANT
DRSG PAD ABDOMINAL 8X10 ST (GAUZE/BANDAGES/DRESSINGS) IMPLANT
DRSG VAC ATS SM SENSATRAC (GAUZE/BANDAGES/DRESSINGS) ×1 IMPLANT
ELECT REM PT RETURN 9FT ADLT (ELECTROSURGICAL) ×2
ELECTRODE REM PT RTRN 9FT ADLT (ELECTROSURGICAL) ×1 IMPLANT
GAUZE XEROFORM 5X9 LF (GAUZE/BANDAGES/DRESSINGS) ×2 IMPLANT
GLOVE BIO SURGEON STRL SZ8 (GLOVE) ×2 IMPLANT
GLOVE BIOGEL PI IND STRL 8 (GLOVE) ×1 IMPLANT
GLOVE BIOGEL PI INDICATOR 8 (GLOVE) ×1
GOWN PREVENTION PLUS XLARGE (GOWN DISPOSABLE) ×2 IMPLANT
GOWN STRL NON-REIN LRG LVL3 (GOWN DISPOSABLE) ×4 IMPLANT
KIT BASIN OR (CUSTOM PROCEDURE TRAY) ×2 IMPLANT
KIT ROOM TURNOVER OR (KITS) ×2 IMPLANT
MANIFOLD NEPTUNE II (INSTRUMENTS) ×2 IMPLANT
NS IRRIG 1000ML POUR BTL (IV SOLUTION) ×2 IMPLANT
PACK ORTHO EXTREMITY (CUSTOM PROCEDURE TRAY) ×2 IMPLANT
PAD ARMBOARD 7.5X6 YLW CONV (MISCELLANEOUS) ×4 IMPLANT
PAD CAST 4YDX4 CTTN HI CHSV (CAST SUPPLIES) ×1 IMPLANT
PADDING CAST COTTON 4X4 STRL (CAST SUPPLIES) ×2
SOAP 2 % CHG 4 OZ (WOUND CARE) ×2 IMPLANT
SPONGE GAUZE 4X4 12PLY (GAUZE/BANDAGES/DRESSINGS) IMPLANT
STAPLER VISISTAT 35W (STAPLE) IMPLANT
SUCTION FRAZIER TIP 10 FR DISP (SUCTIONS) ×2 IMPLANT
SUT ETHILON 3 0 FSL (SUTURE) IMPLANT
SUT PROLENE 3 0 PS 2 (SUTURE) ×2 IMPLANT
SUT VIC AB 2-0 CT1 27 (SUTURE) ×4
SUT VIC AB 2-0 CT1 TAPERPNT 27 (SUTURE) ×2 IMPLANT
SUT VIC AB 3-0 PS2 18 (SUTURE) ×2
SUT VIC AB 3-0 PS2 18XBRD (SUTURE) ×1 IMPLANT
TOWEL OR 17X24 6PK STRL BLUE (TOWEL DISPOSABLE) ×2 IMPLANT
TOWEL OR 17X26 10 PK STRL BLUE (TOWEL DISPOSABLE) ×2 IMPLANT
TUBE CONNECTING 12X1/4 (SUCTIONS) ×2 IMPLANT
TUBING CYSTO DISP (UROLOGICAL SUPPLIES) ×1 IMPLANT
WATER STERILE IRR 1000ML POUR (IV SOLUTION) ×2 IMPLANT
YANKAUER SUCT BULB TIP NO VENT (SUCTIONS) ×2 IMPLANT

## 2012-04-15 NOTE — Transfer of Care (Signed)
Immediate Anesthesia Transfer of Care Note  Patient: Anthony Holder  Procedure(s) Performed: Procedure(s) (LRB): IRRIGATION AND DEBRIDEMENT EXTREMITY (Left) APPLICATION OF WOUND VAC (Left)  Patient Location: PACU  Anesthesia Type: General  Level of Consciousness: awake and oriented  Airway & Oxygen Therapy: Patient Spontanous Breathing and Patient connected to nasal cannula oxygen  Post-op Assessment: Report given to PACU RN and Post -op Vital signs reviewed and stable  Post vital signs: Reviewed  Complications: No apparent anesthesia complications

## 2012-04-15 NOTE — Anesthesia Postprocedure Evaluation (Signed)
  Anesthesia Post-op Note  Patient: Anthony Holder  Procedure(s) Performed: Procedure(s) (LRB): IRRIGATION AND DEBRIDEMENT EXTREMITY (Left) APPLICATION OF WOUND VAC (Left)  Patient Location: PACU  Anesthesia Type: General  Level of Consciousness: awake, alert  and oriented  Airway and Oxygen Therapy: Patient Spontanous Breathing and Patient connected to nasal cannula oxygen  Post-op Pain: none  Post-op Assessment: Post-op Vital signs reviewed and Patient's Cardiovascular Status Stable  Post-op Vital Signs: stable  Complications: No apparent anesthesia complications

## 2012-04-15 NOTE — Preoperative (Signed)
Beta Blockers   Reason not to administer Beta Blockers:Not Applicable 

## 2012-04-15 NOTE — Progress Notes (Signed)
Subjective: S/p debridement. Sleepy. Objective: Filed Vitals:   04/15/12 1478 04/15/12 0919 04/15/12 0920 04/15/12 0921  BP:      Pulse: 86 87 87 86  Temp:      TempSrc:      Resp: 19 21 18 15   Height:      Weight:      SpO2: 100% 99% 99% 100%   Weight change:   Intake/Output Summary (Last 24 hours) at 04/15/12 0927 Last data filed at 04/15/12 2956  Gross per 24 hour  Intake   1380 ml  Output    300 ml  Net   1080 ml    General: Alert, awake, oriented x3, in no acute distress.  HEENT: No bruits, no goiter.  Heart: Regular rate and rhythm, without murmurs, rubs, gallops.  Lungs: Good air movement CTA B/L Abdomen: Soft, nontender, nondistended, positive bowel sounds.  Neuro: Grossly intact, nonfocal.   Lab Results:  Basename 04/14/12 0630 04/12/12 2021  NA 131* 136  K 4.4 4.1  CL 99 104  CO2 20 --  GLUCOSE 197* 160*  BUN 11 14  CREATININE 0.95 1.20  CALCIUM 8.6 --  MG -- --  PHOS -- --   No results found for this basename: AST:2,ALT:2,ALKPHOS:2,BILITOT:2,PROT:2,ALBUMIN:2 in the last 72 hours No results found for this basename: LIPASE:2,AMYLASE:2 in the last 72 hours  Basename 04/14/12 0630 04/12/12 2021 04/12/12 2014  WBC 14.6* -- 12.7*  NEUTROABS -- -- 9.0*  HGB 9.8* 11.6* --  HCT 30.0* 34.0* --  MCV 84.5 -- 84.1  PLT 352 -- 438*   No results found for this basename: CKTOTAL:3,CKMB:3,CKMBINDEX:3,TROPONINI:3 in the last 72 hours No components found with this basename: POCBNP:3 No results found for this basename: DDIMER:2 in the last 72 hours No results found for this basename: HGBA1C:2 in the last 72 hours No results found for this basename: CHOL:2,HDL:2,LDLCALC:2,TRIG:2,CHOLHDL:2,LDLDIRECT:2 in the last 72 hours No results found for this basename: TSH,T4TOTAL,FREET3,T3FREE,THYROIDAB in the last 72 hours No results found for this basename: VITAMINB12:2,FOLATE:2,FERRITIN:2,TIBC:2,IRON:2,RETICCTPCT:2 in the last 72 hours  Micro Results: Recent Results  (from the past 240 hour(s))  SURGICAL PCR SCREEN     Status: Normal   Collection Time   04/13/12 12:33 PM      Component Value Range Status Comment   MRSA, PCR NEGATIVE  NEGATIVE  Final    Staphylococcus aureus NEGATIVE  NEGATIVE  Final   TISSUE CULTURE     Status: Normal (Preliminary result)   Collection Time   04/13/12  3:57 PM      Component Value Range Status Comment   Specimen Description TISSUE FOOT LEFT   Final    Special Requests PT ON ZOSYN   Final    Gram Stain     Final    Value: RARE WBC PRESENT,BOTH PMN AND MONONUCLEAR     NO SQUAMOUS EPITHELIAL CELLS SEEN     NO ORGANISMS SEEN   Culture Culture reincubated for better growth   Final    Report Status PENDING   Incomplete   ANAEROBIC CULTURE     Status: Normal (Preliminary result)   Collection Time   04/13/12  3:57 PM      Component Value Range Status Comment   Specimen Description TISSUE FOOT LEFT   Final    Special Requests PT ON ZOSYN   Final    Gram Stain     Final    Value: RARE WBC PRESENT,BOTH PMN AND MONONUCLEAR     NO SQUAMOUS EPITHELIAL CELLS  SEEN     NO ORGANISMS SEEN   Culture     Final    Value: NO ANAEROBES ISOLATED; CULTURE IN PROGRESS FOR 5 DAYS   Report Status PENDING   Incomplete     Studies/Results: No results found.  Medications: I have reviewed the patient's current medications.  Assessment and plan:  Left diabetic foot with abscess  -Currently status post incision and drainage  -Given operative cultures last admission, I would continue him on Zosyn for now.   -Wound care will be directed by the primary service. 0   Diabetes  -For now agree with sliding scale insulin and continue metformin.  -Start Lantus continue CBG's, will titrate as tolerate it.   3. Hypertension  -Continue with atenolol and Lasix as ordered. -controlled.  4. Dyslipidemia  -Continue with Welchol.  5. History of CVA/coronary disease  -Continue with aspirin  -luekocytosis 2/2 underlying chronic  inflammation/diabetic foot-we will need to repeat labs tomorrow.   DVT Prophylaxis:  SCDs ordered by the orthopedic service.  Code Status:  Full code     LOS: 2 days   Marinda Elk M.D. Pager: [phone] [facility] 04/15/2012, 9:27 AM

## 2012-04-15 NOTE — Anesthesia Procedure Notes (Signed)
Procedure Name: LMA Insertion Date/Time: 04/15/2012 7:51 AM Performed by: Lovie Chol Pre-anesthesia Checklist: Patient identified, Emergency Drugs available, Suction available, Patient being monitored and Timeout performed Patient Re-evaluated:Patient Re-evaluated prior to inductionOxygen Delivery Method: Circle system utilized Preoxygenation: Pre-oxygenation with 100% oxygen Intubation Type: IV induction Ventilation: Mask ventilation without difficulty and Oral airway inserted - appropriate to patient size LMA: LMA inserted LMA Size: 4.0 Number of attempts: 1 Placement Confirmation: positive ETCO2 and breath sounds checked- equal and bilateral Dental Injury: Teeth and Oropharynx as per pre-operative assessment

## 2012-04-15 NOTE — Interval H&P Note (Signed)
History and Physical Interval Note:  04/15/2012 7:07 AM  Anthony Holder  has presented today for surgery, with the diagnosis of left foot abscess.  The various methods of treatment have been discussed with the patient and family. After consideration of risks, benefits and other options for treatment, the patient has consented to  Procedure(s) (LRB):  IRRIGATION AND DEBRIDEMENT EXTREMITY (Left) and APPLICATION OF WOUND VAC (Left) as a surgical intervention .  The patients' history has been reviewed, patient examined, no change in status, stable for surgery.  I have reviewed the patients' chart and labs.  Questions were answered to the patient's satisfaction.     Toni Arthurs

## 2012-04-15 NOTE — Anesthesia Preprocedure Evaluation (Addendum)
Anesthesia Evaluation  Patient identified by MRN, date of birth, ID band Patient awake    Reviewed: Allergy & Precautions, H&P , NPO status , Patient's Chart, lab work & pertinent test results, reviewed documented beta blocker date and time   History of Anesthesia Complications Negative for: history of anesthetic complications  Airway Mallampati: II TM Distance: >3 FB Neck ROM: Full    Dental  (+) Edentulous Upper, Edentulous Lower and Dental Advisory Given   Pulmonary shortness of breath, asthma ,  breath sounds clear to auscultation        Cardiovascular hypertension, Pt. on home beta blockers + CAD and +CHF Rhythm:Regular Rate:Normal  CABG -  Donata Clay /CEA - Early 2010  Echo 2011 mod AS normal EF   Neuro/Psych February 2013 CVA, Residual Symptoms    GI/Hepatic   Endo/Other  Diabetes mellitus-, Poorly Controlled, Type 2, Oral Hypoglycemic Agents and Insulin Dependent  Renal/GU      Musculoskeletal   Abdominal   Peds  Hematology   Anesthesia Other Findings   Reproductive/Obstetrics                          Anesthesia Physical Anesthesia Plan  ASA: III  Anesthesia Plan: General   Post-op Pain Management:    Induction: Intravenous  Airway Management Planned: LMA  Additional Equipment:   Intra-op Plan:   Post-operative Plan:   Informed Consent:   Dental advisory given  Plan Discussed with: CRNA and Surgeon  Anesthesia Plan Comments: (Wound L. Foot s/p toe amputation Type 2 DM Cbg 222 Htn CAD S/P CABG, R. CEA  02/11/09 Obesity  Plan GA with LMA  Kipp Brood, MD )       Anesthesia Quick Evaluation

## 2012-04-15 NOTE — Brief Op Note (Signed)
04/13/2012 - 04/15/2012  8:21 AM  PATIENT:  Anthony Holder  71 y.o. male  PRE-OPERATIVE DIAGNOSIS:  Left foot abscess s/p I and D with wound VAC placement  POST-OPERATIVE DIAGNOSIS:  Same with progression of plantar necrosis  Procedure(s): 1.  Irrigation and debridement of left foot abscess (multiple bursal spaces) 2.  Application of wound VAC  SURGEON:  Toni Arthurs, MD  ASSISTANT: n/a  ANESTHESIA:   General  EBL:  minimal   TOURNIQUET:  n/a  COMPLICATIONS:  None apparent  DISPOSITION:  Extubated, awake and stable to recovery.  DICTATION ID:  [id number]

## 2012-04-16 LAB — GLUCOSE, CAPILLARY: Glucose-Capillary: 286 mg/dL — ABNORMAL HIGH (ref 70–99)

## 2012-04-16 MED ORDER — POLYETHYLENE GLYCOL 3350 17 G PO PACK
17.0000 g | PACK | Freq: Every day | ORAL | Status: DC
  Administered 2012-04-16 – 2012-04-19 (×2): 17 g via ORAL
  Filled 2012-04-16 (×4): qty 1

## 2012-04-16 MED ORDER — INSULIN ASPART 100 UNIT/ML ~~LOC~~ SOLN
0.0000 [IU] | Freq: Every day | SUBCUTANEOUS | Status: DC
  Administered 2012-04-18: 2 [IU] via SUBCUTANEOUS

## 2012-04-16 MED ORDER — INSULIN GLARGINE 100 UNIT/ML ~~LOC~~ SOLN
10.0000 [IU] | Freq: Once | SUBCUTANEOUS | Status: AC
  Administered 2012-04-16: 10 [IU] via SUBCUTANEOUS

## 2012-04-16 MED ORDER — INSULIN ASPART 100 UNIT/ML ~~LOC~~ SOLN
0.0000 [IU] | Freq: Three times a day (TID) | SUBCUTANEOUS | Status: DC
  Administered 2012-04-16: 11 [IU] via SUBCUTANEOUS
  Administered 2012-04-17 (×2): 3 [IU] via SUBCUTANEOUS
  Administered 2012-04-17: 8 [IU] via SUBCUTANEOUS
  Administered 2012-04-18: 3 [IU] via SUBCUTANEOUS
  Administered 2012-04-19: 8 [IU] via SUBCUTANEOUS
  Administered 2012-04-19: 3 [IU] via SUBCUTANEOUS

## 2012-04-16 MED ORDER — INSULIN ASPART 100 UNIT/ML ~~LOC~~ SOLN
4.0000 [IU] | Freq: Three times a day (TID) | SUBCUTANEOUS | Status: DC
  Administered 2012-04-16: 17:00:00 via SUBCUTANEOUS
  Administered 2012-04-17 – 2012-04-19 (×3): 4 [IU] via SUBCUTANEOUS

## 2012-04-16 MED ORDER — INSULIN GLARGINE 100 UNIT/ML ~~LOC~~ SOLN: 25.0000 [IU] | Freq: Every day | SUBCUTANEOUS | Status: DC

## 2012-04-16 MED ORDER — SODIUM CHLORIDE 0.9 % IV SOLN
500.0000 mg | Freq: Four times a day (QID) | INTRAVENOUS | Status: DC
  Administered 2012-04-16 – 2012-04-19 (×13): 500 mg via INTRAVENOUS
  Filled 2012-04-16 (×16): qty 500

## 2012-04-16 MED ORDER — SORBITOL 70 % SOLN
30.0000 mL | Freq: Once | Status: AC
  Administered 2012-04-16: 30 mL via ORAL
  Filled 2012-04-16: qty 30

## 2012-04-16 MED ORDER — INSULIN GLARGINE 100 UNIT/ML ~~LOC~~ SOLN
45.0000 [IU] | Freq: Every day | SUBCUTANEOUS | Status: DC
  Administered 2012-04-17: 25 [IU] via SUBCUTANEOUS
  Administered 2012-04-18: 35 [IU] via SUBCUTANEOUS

## 2012-04-16 NOTE — Op Note (Signed)
NAMERAGHAV, VERRILLI NO.:  [id number]  MEDICAL RECORD NO.:  [id number]  LOCATION:                                 FACILITY:  PHYSICIAN:  Toni Arthurs, MD        DATE OF BIRTH:  Jan 16, 1941  DATE OF PROCEDURE:  04/15/2012 DATE OF DISCHARGE:                              OPERATIVE REPORT   PREOPERATIVE DIAGNOSIS:  Left foot abscess, status post incision and drainage with wound VAC placement.  POSTOPERATIVE DIAGNOSIS:  Left foot abscess, status post incision and drainage with wound VAC placement with progression of plantar necrosis.  PROCEDURE: 1. Irrigation and debridement of left foot abscess (multiple bursal     spaces). 2. Application of wound VAC.  SURGEON:  Toni Arthurs, MD  ANESTHESIA:  General.  ESTIMATED BLOOD LOSS:  Minimal.  TOURNIQUET TIME:  Zero.  COMPLICATIONS:  None apparent.  DISPOSITION:  Extubated, awake and stable to recovery.  INDICATIONS FOR PROCEDURE:  The patient is a 71 year old male who is now 2-1/2 weeks status post amputation of his left second toe for gangrene. The patient then developed a plantar abscess in the arch.  He was brought to the operating room 2 days ago where he underwent irrigation and debridement of this deep abscess with application of wound VAC.  He presents now for a planned return to the operating room for repeat I and D of the abscess and change of the wound VAC dressing.  He understands risks and benefits, alternative treatment options, and elects to proceed with the surgical treatment.  He specifically understands risks of bleeding, infection, nerve damage, blood clots, need for additional surgery, amputation, and death.  PROCEDURE IN DETAIL:  After preoperative consent was obtained and the correct operative site was identified, the patient was brought to the operating room and placed supine on the operating table.  General anesthesia was induced.  Preoperative antibiotics had already  been administered.  Surgical time-out was taken.  The left lower extremity was prepped and draped in standard sterile fashion.  Upon removal of the previous wound VAC dressing, immediately noted was necrosis of the skin between the previous plantar wound and the previous wound between the first and third toes.  The necrotic tissue was excised sharply back to what appeared to be healthy tissue.  Subcutaneous tissue was noted also to be necrotic.  There was again a seropurulent material noted in the deep bursal spaces.  All necrotic tissue was excised sharply from the distal wound and the plantar wound.  These 2 wounds basis connected through the deep fascial spaces.  Once all the necrotic material was excised, the wound was irrigated copiously with 3 L of normal saline. Circumferential debridement was again carried out sharply leaving only viable-appearing tissue.  There was no exposed bone other than the site of the second metatarsal cut from the previous amputation.  The wound VAC sponge was then cut to fit both wounds.  Occlusive dressings were applied.  Vacuum of 125 mmHg was applied and an appropriate seal was achieved.  The patient was then awakened by Anesthesia and transported to recovery room in stable condition.  FOLLOWUP PLAN:  The patient will continue on  IV antibiotics as an inpatient.  He will likely need another return to the operating room in a few days for repeat I and D.     Toni Arthurs, MD     JH/MEDQ  D:  04/15/2012  T:  04/15/2012  Job:  [id number]

## 2012-04-16 NOTE — ED Provider Notes (Signed)
Medical screening examination/treatment/procedure(s) were performed by non-physician practitioner and as supervising physician I was immediately available for consultation/collaboration.  Girtrude Enslin, MD 04/16/12 1718 

## 2012-04-16 NOTE — Progress Notes (Signed)
Orthopedics Progress Note  Subjective: I feel fine this AM. No complaints  Objective:  Filed Vitals:   04/16/12 0630  BP: 123/79  Pulse: 77  Temp: 97.7 F (36.5 C)  Resp: 20    General: Awake and alert  Musculoskeletal:toes well perfused, vac in place and suction intact.  Wiggles toes fine. Neurovascularly intact  Lab Results  Component Value Date   WBC 14.6* 04/14/2012   HGB 9.8* 04/14/2012   HCT 30.0* 04/14/2012   MCV 84.5 04/14/2012   PLT 352 04/14/2012       Component Value Date/Time   NA 131* 04/14/2012 0630   K 4.4 04/14/2012 0630   CL 99 04/14/2012 0630   CO2 20 04/14/2012 0630   GLUCOSE 197* 04/14/2012 0630   BUN 11 04/14/2012 0630   CREATININE 0.95 04/14/2012 0630   CALCIUM 8.6 04/14/2012 0630   GFRNONAA 83* 04/14/2012 0630   GFRAA >90 04/14/2012 0630    Lab Results  Component Value Date   INR 1.21 03/29/2012   INR 1.7* 02/11/2009   INR 1.1 02/07/2009    Assessment/Plan:  s/p Procedure(s): IRRIGATION AND DEBRIDEMENT EXTREMITY APPLICATION OF WOUND VAC Stable this AM, continue IV antibiotics  Elevate foot Care plan per [facility].  Almedia Balls. Ranell Patrick, MD 04/16/2012 8:24 AM

## 2012-04-16 NOTE — Progress Notes (Signed)
Subjective: No complains, except wants to have a BM Sleepy. Objective: Filed Vitals:   04/15/12 1422 04/15/12 2304 04/16/12 0630 04/16/12 1307  BP: 132/66 130/62 123/79 120/76  Pulse: 76 77 77 87  Temp: 97.7 F (36.5 C) 97.9 F (36.6 C) 97.7 F (36.5 C) 98.7 F (37.1 C)  TempSrc: Oral   Oral  Resp: 18 18 20 18   Height:      Weight:      SpO2: 100% 99% 95% 99%   Weight change:   Intake/Output Summary (Last 24 hours) at 04/16/12 1337 Last data filed at 04/16/12 1307  Gross per 24 hour  Intake    730 ml  Output    600 ml  Net    130 ml    General: Alert, awake, oriented x3, in no acute distress.  HEENT: No bruits, no goiter.  Heart: Regular rate and rhythm, without murmurs, rubs, gallops.  Lungs: Good air movement CTA B/L Abdomen: Soft, nontender, nondistended, positive bowel sounds.  Neuro: Grossly intact, nonfocal.   Lab Results:  Basename 04/14/12 0630  NA 131*  K 4.4  CL 99  CO2 20  GLUCOSE 197*  BUN 11  CREATININE 0.95  CALCIUM 8.6  MG --  PHOS --   No results found for this basename: AST:2,ALT:2,ALKPHOS:2,BILITOT:2,PROT:2,ALBUMIN:2 in the last 72 hours No results found for this basename: LIPASE:2,AMYLASE:2 in the last 72 hours  Basename 04/14/12 0630  WBC 14.6*  NEUTROABS --  HGB 9.8*  HCT 30.0*  MCV 84.5  PLT 352   No results found for this basename: CKTOTAL:3,CKMB:3,CKMBINDEX:3,TROPONINI:3 in the last 72 hours No components found with this basename: POCBNP:3 No results found for this basename: DDIMER:2 in the last 72 hours No results found for this basename: HGBA1C:2 in the last 72 hours No results found for this basename: CHOL:2,HDL:2,LDLCALC:2,TRIG:2,CHOLHDL:2,LDLDIRECT:2 in the last 72 hours No results found for this basename: TSH,T4TOTAL,FREET3,T3FREE,THYROIDAB in the last 72 hours No results found for this basename: VITAMINB12:2,FOLATE:2,FERRITIN:2,TIBC:2,IRON:2,RETICCTPCT:2 in the last 72 hours  Micro Results: Recent Results (from the  past 240 hour(s))  SURGICAL PCR SCREEN     Status: Normal   Collection Time   04/13/12 12:33 PM      Component Value Range Status Comment   MRSA, PCR NEGATIVE  NEGATIVE  Final    Staphylococcus aureus NEGATIVE  NEGATIVE  Final   TISSUE CULTURE     Status: Normal (Preliminary result)   Collection Time   04/13/12  3:57 PM      Component Value Range Status Comment   Specimen Description TISSUE FOOT LEFT   Final    Special Requests PT ON ZOSYN   Final    Gram Stain     Final    Value: RARE WBC PRESENT,BOTH PMN AND MONONUCLEAR     NO SQUAMOUS EPITHELIAL CELLS SEEN     NO ORGANISMS SEEN   Culture     Final    Value: MODERATE STAPHYLOCOCCUS AUREUS     Note: RIFAMPIN AND GENTAMICIN SHOULD NOT BE USED AS SINGLE DRUGS FOR TREATMENT OF STAPH INFECTIONS.     MODERATE ENTEROCOCCUS SPECIES   Report Status PENDING   Incomplete   ANAEROBIC CULTURE     Status: Normal (Preliminary result)   Collection Time   04/13/12  3:57 PM      Component Value Range Status Comment   Specimen Description TISSUE FOOT LEFT   Final    Special Requests PT ON ZOSYN   Final    Gram Stain  Final    Value: RARE WBC PRESENT,BOTH PMN AND MONONUCLEAR     NO SQUAMOUS EPITHELIAL CELLS SEEN     NO ORGANISMS SEEN   Culture     Final    Value: NO ANAEROBES ISOLATED; CULTURE IN PROGRESS FOR 5 DAYS   Report Status PENDING   Incomplete     Studies/Results: No results found.  Medications: I have reviewed the patient's current medications.  Assessment and plan:  Left diabetic foot with abscess  -Currently status post incision and drainage  -Wound care will be directed by the primary service.   Diabetes  -For now agree with sliding scale insulin and continue metformin.  -Increase Lantus continue CBG's, will titrate as tolerate it.  3. Hypertension  -Continue with atenolol and Lasix as ordered.  4. Dyslipidemia  -Continue with Welchol.  5. History of CVA/coronary disease  -Continue with aspirin   DVT  Prophylaxis:  SCDs ordered by the orthopedic service.  Code Status:  Full code     LOS: 3 days   Marinda Elk M.D. Pager: [phone] [facility] 04/16/2012, 1:37 PM
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
## 2012-04-17 ENCOUNTER — Encounter (HOSPITAL_COMMUNITY): Payer: Self-pay | Admitting: Orthopedic Surgery

## 2012-04-17 LAB — GLUCOSE, CAPILLARY
Glucose-Capillary: 169 mg/dL — ABNORMAL HIGH (ref 70–99)
Glucose-Capillary: 278 mg/dL — ABNORMAL HIGH (ref 70–99)
Glucose-Capillary: 150 mg/dL — ABNORMAL HIGH (ref 70–99)

## 2012-04-17 LAB — URINALYSIS, ROUTINE W REFLEX MICROSCOPIC
Bilirubin Urine: NEGATIVE
Ketones, ur: NEGATIVE mg/dL
Nitrite: NEGATIVE
Specific Gravity, Urine: 1.014 (ref 1.005–1.030)
pH: 7.5 (ref 5.0–8.0)

## 2012-04-17 LAB — URINE MICROSCOPIC-ADD ON

## 2012-04-17 LAB — TISSUE CULTURE

## 2012-04-17 MED ORDER — METFORMIN HCL ER 500 MG PO TB24
1000.0000 mg | ORAL_TABLET | ORAL | Status: DC
  Administered 2012-04-17 – 2012-04-19 (×2): 1000 mg via ORAL
  Filled 2012-04-17 (×6): qty 2

## 2012-04-17 MED ORDER — SODIUM CHLORIDE 0.9 % IV SOLN: INTRAVENOUS | Status: DC

## 2012-04-17 MED ORDER — MAGNESIUM CITRATE PO SOLN
0.5000 | Freq: Once | ORAL | Status: AC
  Administered 2012-04-17: 0.5 via ORAL
  Filled 2012-04-17: qty 296

## 2012-04-17 MED ORDER — INSULIN GLARGINE 100 UNIT/ML ~~LOC~~ SOLN
40.0000 [IU] | Freq: Every day | SUBCUTANEOUS | Status: DC
  Administered 2012-04-19: 40 [IU] via SUBCUTANEOUS

## 2012-04-17 MED ORDER — ATENOLOL 25 MG PO TABS
25.0000 mg | ORAL_TABLET | Freq: Once | ORAL | Status: DC
  Filled 2012-04-17: qty 1

## 2012-04-17 NOTE — Progress Notes (Signed)
Subjective: Pt to go back to OR today  Antibiotics:  Anti-infectives     Start     Dose/Rate Route Frequency Ordered Stop   04/16/12 1200   imipenem-cilastatin (PRIMAXIN) 500 mg in sodium chloride 0.9 % 100 mL IVPB        500 mg 200 mL/hr over 30 Minutes Intravenous 4 times per day 04/16/12 1110     04/14/12 1400   ceFEPIme (MAXIPIME) 2 g in dextrose 5 % 50 mL IVPB  Status:  Discontinued        2 g 100 mL/hr over 30 Minutes Intravenous 3 times per day 04/14/12 1205 04/16/12 1006   04/14/12 1400   metroNIDAZOLE (FLAGYL) tablet 500 mg  Status:  Discontinued        500 mg Oral 3 times per day 04/14/12 1205 04/16/12 1006   04/13/12 1945   piperacillin-tazobactam (ZOSYN) IVPB 3.375 g  Status:  Discontinued        3.375 g 12.5 mL/hr over 240 Minutes Intravenous Every 8 hours 04/13/12 1748 04/14/12 1205   04/13/12 1800   piperacillin-tazobactam (ZOSYN) IVPB 3.375 g  Status:  Discontinued        3.375 g 12.5 mL/hr over 240 Minutes Intravenous Every 8 hours 04/13/12 1748 04/13/12 1812   04/13/12 1515   piperacillin-tazobactam (ZOSYN) IVPB 3.375 g  Status:  Discontinued        3.375 g 12.5 mL/hr over 240 Minutes Intravenous  Once 04/13/12 1505 04/13/12 1812   04/13/12 1240   ceFAZolin (ANCEF) IVPB 2 g/50 mL premix  Status:  Discontinued        2 g 100 mL/hr over 30 Minutes Intravenous 60 min pre-op 04/13/12 1240 04/13/12 1614   04/13/12 1237   ceFAZolin (ANCEF) IVPB 1 g/50 mL premix  Status:  Discontinued        1 g 100 mL/hr over 30 Minutes Intravenous 60 min pre-op 04/13/12 1237 04/13/12 1240          Medications: Scheduled Meds:   . aspirin EC  81 mg Oral Daily  . atenolol  25 mg Oral Daily  . colesevelam  1,875 mg Oral BID  . docusate sodium  100 mg Oral BID  . fluticasone  2 spray Each Nare Daily  . furosemide  40 mg Oral BID  . gabapentin  200 mg Oral TID  . imipenem-cilastatin  500 mg Intravenous Q6H  . insulin aspart  0-15 Units Subcutaneous TID WC  . insulin  aspart  0-5 Units Subcutaneous QHS  . insulin aspart  4 Units Subcutaneous TID WC  . insulin glargine  10 Units Subcutaneous Once  . insulin glargine  20 Units Subcutaneous Daily  . insulin glargine  45 Units Subcutaneous QHS  . magnesium citrate  0.5 Bottle Oral Once  . metFORMIN  1,000 mg Oral BID  . polyethylene glycol  17 g Oral Daily  . potassium chloride SA  20 mEq Oral QODAY  . sodium chloride  10-40 mL Intracatheter Q12H  . sorbitol  30 mL Oral Once  . vitamin B-12  1,000 mcg Oral Daily  . vitamin C  500 mg Oral TID  . DISCONTD: insulin aspart  0-15 Units Subcutaneous TID WC  . DISCONTD: insulin glargine  15 Units Subcutaneous QHS  . DISCONTD: insulin glargine  25 Units Subcutaneous QHS   Continuous Infusions:   . sodium chloride     PRN Meds:.HYDROmorphone, HYDROmorphone, metoCLOPramide (REGLAN) injection, metoCLOPramide, ondansetron (ZOFRAN) IV, ondansetron, oxyCODONE, oxyCODONE, sodium chloride  Objective: Weight change:   Intake/Output Summary (Last 24 hours) at 04/17/12 1021 Last data filed at 04/17/12 0700  Gross per 24 hour  Intake    240 ml  Output    600 ml  Net   -360 ml   Blood pressure 127/79, pulse 92, temperature 98.2 F (36.8 C), temperature source Oral, resp. rate 18, height 5\' 10"  (1.778 m), weight 202 lb (91.627 kg), SpO2 96.00%. Temp:  [98.2 F (36.8 C)-99.2 F (37.3 C)] 98.2 F (36.8 C) (04/29 0525) Pulse Rate:  [87-98] 92  (04/29 0525) Resp:  [18] 18  (04/29 0525) BP: (120-127)/(76-83) 127/79 mmHg (04/29 0525) SpO2:  [96 %-99 %] 96 % (04/29 0525)  Physical Exam: General: Alert and awake, oriented x3, not in any acute distress.  HEENT: anicteric sclera, pupils reactive to light and accommodation, EOMI, oropharynx clear and without exudate  CVS regular rate, normal r, no murmur rubs or gallops  Chest: clear to auscultation bilaterally, no wheezing, rales or rhonchi  Abdomen: soft nontender, nondistended, normal bowel sounds,  Extremities:  foot with wound vacuum in place dressing  Neuro: nonfocal, strength and sensation intact  Lab Results: No results found for this basename: WBC:2,HGB:2,HCT:2,PLT:2 in the last 72 hours  BMET No results found for this basename: NA:2,K:2,CL:2,CO2:2,GLUCOSE:2,BUN:2,CREATININE:2,CALCIUM:2 in the last 72 hours  Micro Results: Recent Results (from the past 240 hour(s))  SURGICAL PCR SCREEN     Status: Normal   Collection Time   04/13/12 12:33 PM      Component Value Range Status Comment   MRSA, PCR NEGATIVE  NEGATIVE  Final    Staphylococcus aureus NEGATIVE  NEGATIVE  Final   TISSUE CULTURE     Status: Normal   Collection Time   04/13/12  3:57 PM      Component Value Range Status Comment   Specimen Description TISSUE FOOT LEFT   Final    Special Requests PT ON ZOSYN   Final    Gram Stain     Final    Value: RARE WBC PRESENT,BOTH PMN AND MONONUCLEAR     NO SQUAMOUS EPITHELIAL CELLS SEEN     NO ORGANISMS SEEN   Culture     Final    Value: MODERATE STAPHYLOCOCCUS AUREUS     Note: RIFAMPIN AND GENTAMICIN SHOULD NOT BE USED AS SINGLE DRUGS FOR TREATMENT OF STAPH INFECTIONS. This organism DOES NOT demonstrate inducible Clindamycin resistance in vitro.     MODERATE ENTEROCOCCUS SPECIES   Report Status 04/17/2012 FINAL   Final    Organism ID, Bacteria STAPHYLOCOCCUS AUREUS   Final    Organism ID, Bacteria ENTEROCOCCUS SPECIES   Final   ANAEROBIC CULTURE     Status: Normal (Preliminary result)   Collection Time   04/13/12  3:57 PM      Component Value Range Status Comment   Specimen Description TISSUE FOOT LEFT   Final    Special Requests PT ON ZOSYN   Final    Gram Stain     Final    Value: RARE WBC PRESENT,BOTH PMN AND MONONUCLEAR     NO SQUAMOUS EPITHELIAL CELLS SEEN     NO ORGANISMS SEEN   Culture     Final    Value: NO ANAEROBES ISOLATED; CULTURE IN PROGRESS FOR 5 DAYS   Report Status PENDING   Incomplete     Studies/Results: No results found.    Assessment/Plan: Jameek Bruntz is a 71 y.o. malewith gangrene sp amputation now with diabetic foot infection with  deep abscess sp I and D Prior cultures with MSSA and Klebsietlla PNA now with MSSA and AMp Sens enterococcus on culture. Antibiotics changed to imipenem  --continue imipenem and would treat for 4 weeks postoperatively --I will arrange for followup in the ID clinic ([facility]) prior to his stopping abx     LOS: 4 days   Acey Lav 04/17/2012, 10:21 AM

## 2012-04-17 NOTE — Progress Notes (Signed)
Subjective: 2 Days Post-Op Procedure(s) (LRB): IRRIGATION AND DEBRIDEMENT EXTREMITY (Left) APPLICATION OF WOUND VAC (Left) Patient reports pain as mild.  No BM.  Objective: Vital signs in last 24 hours: Temp:  [98.2 F (36.8 C)-99.2 F (37.3 C)] 98.2 F (36.8 C) (04/29 0525) Pulse Rate:  [87-98] 92  (04/29 0525) Resp:  [18] 18  (04/29 0525) BP: (120-127)/(76-83) 127/79 mmHg (04/29 0525) SpO2:  [96 %-99 %] 96 % (04/29 0525)  Intake/Output from previous day: 04/28 0701 - 04/29 0700 In: 720 [P.O.:720] Out: 1200 [Urine:1200] Intake/Output this shift:    No results found for this basename: HGB:5 in the last 72 hours No results found for this basename: WBC:2,RBC:2,HCT:2,PLT:2 in the last 72 hours No results found for this basename: NA:2,K:2,CL:2,CO2:2,BUN:2,CREATININE:2,GLUCOSE:2,CALCIUM:2 in the last 72 hours No results found for this basename: LABPT:2,INR:2 in the last 72 hours  wound vac functioning well.  serosang drainage.  Assessment/Plan: 2 Days Post-Op Procedure(s) (LRB): IRRIGATION AND DEBRIDEMENT EXTREMITY (Left) APPLICATION OF WOUND VAC (Left) Up with therapy Pt needs additional return to the OR for I and D and possible amputation at the chopart level or BKA.  Mag citrate for BM.  NPO after midnight.  Toni Arthurs 04/17/2012, 7:44 AM

## 2012-04-18 ENCOUNTER — Encounter (HOSPITAL_COMMUNITY): Payer: Self-pay | Admitting: Anesthesiology

## 2012-04-18 ENCOUNTER — Inpatient Hospital Stay (HOSPITAL_COMMUNITY): Payer: Medicare Other | Admitting: Anesthesiology

## 2012-04-18 ENCOUNTER — Encounter (HOSPITAL_COMMUNITY)
Admission: RE | Disposition: A | Discharge status: Home / Self Care | Payer: Self-pay | Source: Ambulatory Visit | Attending: Orthopedic Surgery

## 2012-04-18 HISTORY — PX: I & D EXTREMITY: SHX5045

## 2012-04-18 LAB — ANAEROBIC CULTURE

## 2012-04-18 LAB — GLUCOSE, CAPILLARY
Glucose-Capillary: 127 mg/dL — ABNORMAL HIGH (ref 70–99)
Glucose-Capillary: 128 mg/dL — ABNORMAL HIGH (ref 70–99)
Glucose-Capillary: 223 mg/dL — ABNORMAL HIGH (ref 70–99)

## 2012-04-18 SURGERY — IRRIGATION AND DEBRIDEMENT EXTREMITY
Anesthesia: General | Site: Foot | Laterality: Left | Wound class: Dirty or Infected

## 2012-04-18 MED ORDER — HYDROMORPHONE HCL PF 1 MG/ML IJ SOLN
INTRAMUSCULAR | Status: AC
  Filled 2012-04-18: qty 1

## 2012-04-18 MED ORDER — HYDROMORPHONE HCL PF 1 MG/ML IJ SOLN
0.2500 mg | INTRAMUSCULAR | Status: DC | PRN
  Administered 2012-04-18 (×4): 0.5 mg via INTRAVENOUS

## 2012-04-18 MED ORDER — ONDANSETRON HCL 4 MG/2ML IJ SOLN
INTRAMUSCULAR | Status: DC | PRN
  Administered 2012-04-18: 4 mg via INTRAVENOUS

## 2012-04-18 MED ORDER — MIDAZOLAM HCL 5 MG/5ML IJ SOLN
INTRAMUSCULAR | Status: DC | PRN
  Administered 2012-04-18: 1 mg via INTRAVENOUS

## 2012-04-18 MED ORDER — SORBITOL 70 % SOLN
30.0000 mL | Freq: Once | Status: AC
  Administered 2012-04-18: 30 mL via ORAL
  Filled 2012-04-18: qty 30

## 2012-04-18 MED ORDER — SODIUM CHLORIDE 0.9 % IR SOLN
Status: DC | PRN
  Administered 2012-04-18: 3000 mL
  Administered 2012-04-18: 1000 mL

## 2012-04-18 MED ORDER — PROPOFOL 10 MG/ML IV EMUL
INTRAVENOUS | Status: DC | PRN
  Administered 2012-04-18: 100 mg via INTRAVENOUS

## 2012-04-18 MED ORDER — EPHEDRINE SULFATE 50 MG/ML IJ SOLN
INTRAMUSCULAR | Status: DC | PRN
  Administered 2012-04-18: 5 mg via INTRAVENOUS

## 2012-04-18 MED ORDER — FENTANYL CITRATE 0.05 MG/ML IJ SOLN
INTRAMUSCULAR | Status: DC | PRN
  Administered 2012-04-18 (×2): 50 ug via INTRAVENOUS
  Administered 2012-04-18 (×2): 25 ug via INTRAVENOUS

## 2012-04-18 MED ORDER — LACTATED RINGERS IV SOLN
INTRAVENOUS | Status: DC | PRN
  Administered 2012-04-18: 14:00:00 via INTRAVENOUS

## 2012-04-18 SURGICAL SUPPLY — 59 items
BANDAGE ESMARK 6X9 LF (GAUZE/BANDAGES/DRESSINGS) ×1 IMPLANT
BLADE SURG 10 STRL SS (BLADE) ×2 IMPLANT
BNDG CMPR 9X6 STRL LF SNTH (GAUZE/BANDAGES/DRESSINGS)
BNDG COHESIVE 4X5 TAN STRL (GAUZE/BANDAGES/DRESSINGS) ×1 IMPLANT
BNDG COHESIVE 6X5 TAN STRL LF (GAUZE/BANDAGES/DRESSINGS) ×1 IMPLANT
BNDG ESMARK 6X9 LF (GAUZE/BANDAGES/DRESSINGS)
CHLORAPREP W/TINT 26ML (MISCELLANEOUS) ×2 IMPLANT
CLOTH BEACON ORANGE TIMEOUT ST (SAFETY) ×2 IMPLANT
COVER SURGICAL LIGHT HANDLE (MISCELLANEOUS) ×2 IMPLANT
CUFF TOURNIQUET SINGLE 34IN LL (TOURNIQUET CUFF) ×1 IMPLANT
CUFF TOURNIQUET SINGLE 44IN (TOURNIQUET CUFF) IMPLANT
DRAPE SURG 17X23 STRL (DRAPES) ×1 IMPLANT
DRAPE U-SHAPE 47X51 STRL (DRAPES) ×1 IMPLANT
DRSG ADAPTIC 3X8 NADH LF (GAUZE/BANDAGES/DRESSINGS) ×1 IMPLANT
DRSG PAD ABDOMINAL 8X10 ST (GAUZE/BANDAGES/DRESSINGS) IMPLANT
DRSG VAC ATS SM SENSATRAC (GAUZE/BANDAGES/DRESSINGS) ×1 IMPLANT
ELECT REM PT RETURN 9FT ADLT (ELECTROSURGICAL) ×2
ELECTRODE REM PT RTRN 9FT ADLT (ELECTROSURGICAL) ×1 IMPLANT
GAUZE XEROFORM 5X9 LF (GAUZE/BANDAGES/DRESSINGS) ×1 IMPLANT
GEL ULTRASOUND 20GR AQUASONIC (MISCELLANEOUS) ×1 IMPLANT
GLOVE BIO SURGEON STRL SZ8 (GLOVE) ×3 IMPLANT
GLOVE BIOGEL PI IND STRL 7.0 (GLOVE) IMPLANT
GLOVE BIOGEL PI IND STRL 8 (GLOVE) ×1 IMPLANT
GLOVE BIOGEL PI INDICATOR 7.0 (GLOVE) ×1
GLOVE BIOGEL PI INDICATOR 8 (GLOVE) ×1
GLOVE EXAM NITRILE LRG STRL (GLOVE) IMPLANT
GLOVE EXAM NITRILE MD LF STRL (GLOVE) ×1 IMPLANT
GLOVE SURG SS PI 6.5 STRL IVOR (GLOVE) ×1 IMPLANT
GOWN PREVENTION PLUS XLARGE (GOWN DISPOSABLE) ×3 IMPLANT
GOWN STRL NON-REIN LRG LVL3 (GOWN DISPOSABLE) ×2 IMPLANT
KIT BASIN OR (CUSTOM PROCEDURE TRAY) ×2 IMPLANT
KIT ROOM TURNOVER OR (KITS) ×2 IMPLANT
MANIFOLD NEPTUNE II (INSTRUMENTS) ×2 IMPLANT
MATRIX SURGICAL PSM 7X10CM (Tissue) ×1 IMPLANT
MICROMATRIX 1000MG (Tissue) ×2 IMPLANT
NS IRRIG 1000ML POUR BTL (IV SOLUTION) ×2 IMPLANT
PACK ORTHO EXTREMITY (CUSTOM PROCEDURE TRAY) ×2 IMPLANT
PAD ARMBOARD 7.5X6 YLW CONV (MISCELLANEOUS) ×3 IMPLANT
PAD CAST 4YDX4 CTTN HI CHSV (CAST SUPPLIES) ×1 IMPLANT
PADDING CAST COTTON 4X4 STRL (CAST SUPPLIES)
SOAP 2 % CHG 4 OZ (WOUND CARE) ×2 IMPLANT
SOLUTION PARTIC MCRMTRX 1000MG (Tissue) IMPLANT
SPONGE GAUZE 4X4 12PLY (GAUZE/BANDAGES/DRESSINGS) IMPLANT
STAPLER VISISTAT 35W (STAPLE) ×1 IMPLANT
SUCTION FRAZIER TIP 10 FR DISP (SUCTIONS) ×1 IMPLANT
SUT ETHILON 2 0 FS 18 (SUTURE) ×1 IMPLANT
SUT ETHILON 3 0 FSL (SUTURE) IMPLANT
SUT PROLENE 3 0 PS 2 (SUTURE) ×1 IMPLANT
SUT VIC AB 2-0 CT1 27 (SUTURE)
SUT VIC AB 2-0 CT1 TAPERPNT 27 (SUTURE) ×2 IMPLANT
SUT VIC AB 3-0 PS2 18 (SUTURE)
SUT VIC AB 3-0 PS2 18XBRD (SUTURE) ×1 IMPLANT
SUT VIC AB 5-0 P-3 18XBRD (SUTURE) IMPLANT
SUT VIC AB 5-0 P3 18 (SUTURE) ×2
TOWEL OR 17X24 6PK STRL BLUE (TOWEL DISPOSABLE) ×2 IMPLANT
TOWEL OR 17X26 10 PK STRL BLUE (TOWEL DISPOSABLE) ×2 IMPLANT
TUBE CONNECTING 12X1/4 (SUCTIONS) ×2 IMPLANT
WATER STERILE IRR 1000ML POUR (IV SOLUTION) ×1 IMPLANT
YANKAUER SUCT BULB TIP NO VENT (SUCTIONS) ×2 IMPLANT

## 2012-04-18 NOTE — Progress Notes (Signed)
Subjective: Pt to go back to OR   Antibiotics:  Anti-infectives     Start     Dose/Rate Route Frequency Ordered Stop   04/16/12 1200   imipenem-cilastatin (PRIMAXIN) 500 mg in sodium chloride 0.9 % 100 mL IVPB        500 mg 200 mL/hr over 30 Minutes Intravenous 4 times per day 04/16/12 1110     04/14/12 1400   ceFEPIme (MAXIPIME) 2 g in dextrose 5 % 50 mL IVPB  Status:  Discontinued        2 g 100 mL/hr over 30 Minutes Intravenous 3 times per day 04/14/12 1205 04/16/12 1006   04/14/12 1400   metroNIDAZOLE (FLAGYL) tablet 500 mg  Status:  Discontinued        500 mg Oral 3 times per day 04/14/12 1205 04/16/12 1006   04/13/12 1945   piperacillin-tazobactam (ZOSYN) IVPB 3.375 g  Status:  Discontinued        3.375 g 12.5 mL/hr over 240 Minutes Intravenous Every 8 hours 04/13/12 1748 04/14/12 1205   04/13/12 1800   piperacillin-tazobactam (ZOSYN) IVPB 3.375 g  Status:  Discontinued        3.375 g 12.5 mL/hr over 240 Minutes Intravenous Every 8 hours 04/13/12 1748 04/13/12 1812   04/13/12 1515   piperacillin-tazobactam (ZOSYN) IVPB 3.375 g  Status:  Discontinued        3.375 g 12.5 mL/hr over 240 Minutes Intravenous  Once 04/13/12 1505 04/13/12 1812   04/13/12 1240   ceFAZolin (ANCEF) IVPB 2 g/50 mL premix  Status:  Discontinued        2 g 100 mL/hr over 30 Minutes Intravenous 60 min pre-op 04/13/12 1240 04/13/12 1614   04/13/12 1237   ceFAZolin (ANCEF) IVPB 1 g/50 mL premix  Status:  Discontinued        1 g 100 mL/hr over 30 Minutes Intravenous 60 min pre-op 04/13/12 1237 04/13/12 1240          Medications: Scheduled Meds:    . aspirin EC  81 mg Oral Daily  . atenolol  25 mg Oral Daily  . atenolol  25 mg Oral Once  . colesevelam  1,875 mg Oral BID  . docusate sodium  100 mg Oral BID  . fluticasone  2 spray Each Nare Daily  . furosemide  40 mg Oral BID  . gabapentin  200 mg Oral TID  . imipenem-cilastatin  500 mg Intravenous Q6H  . insulin aspart  0-15 Units  Subcutaneous TID WC  . insulin aspart  0-5 Units Subcutaneous QHS  . insulin aspart  4 Units Subcutaneous TID WC  . insulin glargine  40 Units Subcutaneous Daily  . insulin glargine  45 Units Subcutaneous QHS  . metFORMIN  1,000 mg Oral Custom  . polyethylene glycol  17 g Oral Daily  . potassium chloride SA  20 mEq Oral QODAY  . sodium chloride  10-40 mL Intracatheter Q12H  . vitamin B-12  1,000 mcg Oral Daily  . vitamin C  500 mg Oral TID  . DISCONTD: metFORMIN  1,000 mg Oral BID   Continuous Infusions:    . sodium chloride 75 mL/hr at 04/18/12 0100   PRN Meds:.HYDROmorphone, HYDROmorphone, metoCLOPramide (REGLAN) injection, metoCLOPramide, ondansetron (ZOFRAN) IV, ondansetron, oxyCODONE, oxyCODONE, sodium chloride   Objective: Weight change:   Intake/Output Summary (Last 24 hours) at 04/18/12 1204 Last data filed at 04/18/12 0810  Gross per 24 hour  Intake    600 ml  Output  801 ml  Net   -201 ml   Blood pressure 110/65, pulse 76, temperature 97.5 F (36.4 C), temperature source Oral, resp. rate 20, height 5\' 10"  (1.778 m), weight 202 lb (91.627 kg), SpO2 100.00%. Temp:  [97.5 F (36.4 C)-98.7 F (37.1 C)] 97.5 F (36.4 C) (04/30 0549) Pulse Rate:  [76-95] 76  (04/30 0549) Resp:  [18-20] 20  (04/30 0549) BP: (110-126)/(65-88) 110/65 mmHg (04/30 0549) SpO2:  [97 %-100 %] 100 % (04/30 0549)  Physical Exam: General: Alert and awake, oriented x3, not in any acute distress.  HEENT: anicteric sclera, pupils reactive to light and accommodation, EOMI, oropharynx clear and without exudate  CVS regular rate, normal r, no murmur rubs or gallops  Chest: clear to auscultation bilaterally, no wheezing, rales or rhonchi  Abdomen: soft nontender, nondistended, normal bowel sounds,  Extremities: foot with wound vacuum in place dressing  Neuro: nonfocal, strength and sensation intact  Lab Results: No results found for this basename: WBC:2,HGB:2,HCT:2,PLT:2 in the last 72  hours  BMET No results found for this basename: NA:2,K:2,CL:2,CO2:2,GLUCOSE:2,BUN:2,CREATININE:2,CALCIUM:2 in the last 72 hours  Micro Results: Recent Results (from the past 240 hour(s))  SURGICAL PCR SCREEN     Status: Normal   Collection Time   04/13/12 12:33 PM      Component Value Range Status Comment   MRSA, PCR NEGATIVE  NEGATIVE  Final    Staphylococcus aureus NEGATIVE  NEGATIVE  Final   TISSUE CULTURE     Status: Normal   Collection Time   04/13/12  3:57 PM      Component Value Range Status Comment   Specimen Description TISSUE FOOT LEFT   Final    Special Requests PT ON ZOSYN   Final    Gram Stain     Final    Value: RARE WBC PRESENT,BOTH PMN AND MONONUCLEAR     NO SQUAMOUS EPITHELIAL CELLS SEEN     NO ORGANISMS SEEN   Culture     Final    Value: MODERATE STAPHYLOCOCCUS AUREUS     Note: RIFAMPIN AND GENTAMICIN SHOULD NOT BE USED AS SINGLE DRUGS FOR TREATMENT OF STAPH INFECTIONS. This organism DOES NOT demonstrate inducible Clindamycin resistance in vitro.     MODERATE ENTEROCOCCUS SPECIES   Report Status 04/17/2012 FINAL   Final    Organism ID, Bacteria STAPHYLOCOCCUS AUREUS   Final    Organism ID, Bacteria ENTEROCOCCUS SPECIES   Final   ANAEROBIC CULTURE     Status: Normal (Preliminary result)   Collection Time   04/13/12  3:57 PM      Component Value Range Status Comment   Specimen Description TISSUE FOOT LEFT   Final    Special Requests PT ON ZOSYN   Final    Gram Stain     Final    Value: RARE WBC PRESENT,BOTH PMN AND MONONUCLEAR     NO SQUAMOUS EPITHELIAL CELLS SEEN     NO ORGANISMS SEEN   Culture     Final    Value: NO ANAEROBES ISOLATED; CULTURE IN PROGRESS FOR 5 DAYS   Report Status PENDING   Incomplete     Studies/Results: No results found.    Assessment/Plan: Anthony Holder is a 71 y.o. malewith gangrene sp amputation now with diabetic foot infection with deep abscess sp I and D Prior cultures with MSSA and Klebsietlla PNA now with MSSA and AMp Sens  enterococcus on culture. Antibiotics changed to imipenem. He is going back to OR for further surgery, possibly  Chopart amputation  --continue imipenem and would treat for 4 weeks postoperatively , we could potentially shorten that therapy if he has a more proximal amputation with perfectly clean margins on surgical pathology but i certainly have higher anxiety abou this pt --I will arrange for followup in the ID clinic ([facility]) prior to his stopping abx       LOS: 5 days   Acey Lav 04/18/2012, 12:04 PM

## 2012-04-18 NOTE — Interval H&P Note (Signed)
History and Physical Interval Note:  04/18/2012 2:39 PM  Anthony Holder  has presented today for surgery, with the diagnosis of LEFT FOOT DIABETIC ULCER AND ABCESS  The various methods of treatment have been discussed with the patient and family. After consideration of risks, benefits and other options for treatment, the patient has consented to  Procedure(s) (LRB): IRRIGATION AND DEBRIDEMENT EXTREMITY (Left) with possible chopart v. Below knee amputation as a surgical intervention .  The patients' history has been reviewed, patient examined, no change in status, stable for surgery.  I have reviewed the patients' chart and labs.  Questions were answered to the patient's satisfaction.     Toni Arthurs

## 2012-04-18 NOTE — Progress Notes (Signed)
Inpatient Diabetes Program Recommendations  AACE/ADA: New Consensus Statement on Inpatient Glycemic Control (2009)  Target Ranges:  Prepandial:   less than 140 mg/dL      Peak postprandial:   less than 180 mg/dL (1-2 hours)      Critically ill patients:  140 - 180 mg/dL   Reason for Visit: Hyperglycemia  Note: Patient has not been taking Lantus insulin in dosages ordered.  Yesterday morning, took 20 units instead of 40 units.  Last night took 25 units instead of 45 as ordered.  NPO thus far today and CBG's acceptable.  When patient able to eat again, may benefit from an increase in meal coverage to 8 units tid in addition to correction-- with continued upward titration as indicated.  Perhaps a total daily dose of 45 units of Lantus is a good amount as his morning CBG was 165 mg/dl.  Thank you. Benigna Delisi S. Elsie Lincoln, RN, CNS, CDE  [phone])

## 2012-04-18 NOTE — Transfer of Care (Signed)
Immediate Anesthesia Transfer of Care Note  Patient: Anthony Holder  Procedure(s) Performed: Procedure(s) (LRB): IRRIGATION AND DEBRIDEMENT EXTREMITY (Left)  Patient Location: PACU  Anesthesia Type: General  Level of Consciousness: awake, oriented and patient cooperative  Airway & Oxygen Therapy: Patient Spontanous Breathing and Patient connected to nasal cannula oxygen  Post-op Assessment: Report given to PACU RN and Post -op Vital signs reviewed and stable  Post vital signs: Reviewed and stable  Complications: No apparent anesthesia complications

## 2012-04-18 NOTE — Brief Op Note (Signed)
04/13/2012 - 04/18/2012  3:56 PM  PATIENT:  Anthony Holder  71 y.o. male  PRE-OPERATIVE DIAGNOSIS:  LEFT FOOT DIABETIC ULCER AND ABCESS  POST-OPERATIVE DIAGNOSIS:  LEFT FOOT DIABETIC ULCER AND ABCESS  Procedure(s): 1.  Irrigation and debridement of left foot wound including multiple areas 2.  Application of skin graft substitute (4cm x 10 cm x 1 cm) 3.  Application of wound vac (4 cm x 10 cm x 1 cm)  SURGEON:  Toni Arthurs, MD  ASSISTANT: n/a  ANESTHESIA:   General  EBL:  minimal   TOURNIQUET:  * No tourniquets in log *  COMPLICATIONS:  None apparent  DISPOSITION:  Extubated, awake and stable to recovery.  DICTATION ID:  [id number]

## 2012-04-18 NOTE — Anesthesia Preprocedure Evaluation (Signed)
Anesthesia Evaluation  Patient identified by MRN, date of birth, ID band Patient awake    Reviewed: Allergy & Precautions, H&P , NPO status , Patient's Chart, lab work & pertinent test results, reviewed documented beta blocker date and time   History of Anesthesia Complications Negative for: history of anesthetic complications  Airway Mallampati: II TM Distance: >3 FB Neck ROM: Full    Dental  (+) Edentulous Upper and Edentulous Lower   Pulmonary asthma ,  breath sounds clear to auscultation  Pulmonary exam normal       Cardiovascular hypertension, Pt. on medications and Pt. 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), Type 2, Insulin Dependent and Oral Hypoglycemic AgentsMorbid obesity  Renal/GU negative Renal ROS     Musculoskeletal   Abdominal (+) + obese,   Peds  Hematology   Anesthesia Other Findings   Reproductive/Obstetrics                           Anesthesia Physical Anesthesia Plan  ASA: III  Anesthesia Plan: General   Post-op Pain Management:    Induction: Intravenous  Airway Management Planned: LMA  Additional Equipment:   Intra-op Plan:   Post-operative Plan:   Informed Consent: I have reviewed the patients History and Physical, chart, labs and discussed the procedure including the risks, benefits and alternatives for the proposed anesthesia with the patient or authorized representative who has indicated his/her understanding and acceptance.     Plan Discussed with: CRNA and Surgeon  Anesthesia Plan Comments: (Plan  routine monitors GA- LMA OK)        Anesthesia Quick Evaluation

## 2012-04-18 NOTE — Preoperative (Signed)
Beta Blockers   Reason not to administer Beta Blockers:Not Applicable, took Atenolol at 0940

## 2012-04-18 NOTE — Anesthesia Postprocedure Evaluation (Signed)
  Anesthesia Post-op Note  Patient: Anthony Holder  Procedure(s) Performed: Procedure(s) (LRB): IRRIGATION AND DEBRIDEMENT EXTREMITY (Left)  Patient Location: PACU  Anesthesia Type: General  Level of Consciousness: awake  Airway and Oxygen Therapy: Patient Spontanous Breathing  Post-op Pain: mild  Post-op Assessment: Post-op Vital signs reviewed, Patient's Cardiovascular Status Stable, Respiratory Function Stable, Patent Airway, No signs of Nausea or vomiting and Pain level controlled  Post-op Vital Signs: stable  Complications: No apparent anesthesia complications

## 2012-04-18 NOTE — Progress Notes (Signed)
Subjective: constipation Objective: Filed Vitals:   04/18/12 1655 04/18/12 1700 04/18/12 1715 04/18/12 1730  BP:  115/52 126/58   Pulse: 66 68 71 76  Temp:    97.8 F (36.6 C)  TempSrc:      Resp: 12 16 16 20   Height:      Weight:      SpO2: 99% 100% 100% 100%   Weight change:   Intake/Output Summary (Last 24 hours) at 04/18/12 1844 Last data filed at 04/18/12 1600  Gross per 24 hour  Intake   1300 ml  Output   1001 ml  Net    299 ml    General: Alert, awake, oriented x3, in no acute distress.  Heart: Regular rate and rhythm, without murmurs, rubs, gallops.  Lungs: Good air movement CTA B/L Abdomen: Soft, nontender, nondistended, diminished bowel sounds.     Lab Results: No results found for this basename: NA:5,K:5,CL:5,CO2:5,GLUCOSE:5,BUN:5,CREATININE:5,CALCIUM:5,MG:5,PHOS:5 in the last 72 hours No results found for this basename: AST:2,ALT:2,ALKPHOS:2,BILITOT:2,PROT:2,ALBUMIN:2 in the last 72 hours No results found for this basename: LIPASE:2,AMYLASE:2 in the last 72 hours No results found for this basename: WBC:2,NEUTROABS:2,HGB:2,HCT:2,MCV:2,PLT:2 in the last 72 hours No results found for this basename: CKTOTAL:3,CKMB:3,CKMBINDEX:3,TROPONINI:3 in the last 72 hours No components found with this basename: POCBNP:3 No results found for this basename: DDIMER:2 in the last 72 hours  Basename 04/16/12 1420  HGBA1C 10.2*   No results found for this basename: CHOL:2,HDL:2,LDLCALC:2,TRIG:2,CHOLHDL:2,LDLDIRECT:2 in the last 72 hours No results found for this basename: TSH,T4TOTAL,FREET3,T3FREE,THYROIDAB in the last 72 hours No results found for this basename: VITAMINB12:2,FOLATE:2,FERRITIN:2,TIBC:2,IRON:2,RETICCTPCT:2 in the last 72 hours  Micro Results: Recent Results (from the past 240 hour(s))  SURGICAL PCR SCREEN     Status: Normal   Collection Time   04/13/12 12:33 PM      Component Value Range Status Comment   MRSA, PCR NEGATIVE  NEGATIVE  Final    Staphylococcus  aureus NEGATIVE  NEGATIVE  Final   TISSUE CULTURE     Status: Normal   Collection Time   04/13/12  3:57 PM      Component Value Range Status Comment   Specimen Description TISSUE FOOT LEFT   Final    Special Requests PT ON ZOSYN   Final    Gram Stain     Final    Value: RARE WBC PRESENT,BOTH PMN AND MONONUCLEAR     NO SQUAMOUS EPITHELIAL CELLS SEEN     NO ORGANISMS SEEN   Culture     Final    Value: MODERATE STAPHYLOCOCCUS AUREUS     Note: RIFAMPIN AND GENTAMICIN SHOULD NOT BE USED AS SINGLE DRUGS FOR TREATMENT OF STAPH INFECTIONS. This organism DOES NOT demonstrate inducible Clindamycin resistance in vitro.     MODERATE ENTEROCOCCUS SPECIES   Report Status 04/17/2012 FINAL   Final    Organism ID, Bacteria STAPHYLOCOCCUS AUREUS   Final    Organism ID, Bacteria ENTEROCOCCUS SPECIES   Final   ANAEROBIC CULTURE     Status: Normal   Collection Time   04/13/12  3:57 PM      Component Value Range Status Comment   Specimen Description TISSUE FOOT LEFT   Final    Special Requests PT ON ZOSYN   Final    Gram Stain     Final    Value: RARE WBC PRESENT,BOTH PMN AND MONONUCLEAR     NO SQUAMOUS EPITHELIAL CELLS SEEN     NO ORGANISMS SEEN   Culture NO ANAEROBES ISOLATED  Final    Report Status 04/18/2012 FINAL   Final     Studies/Results: No results found.     [name] aspirin EC  81 mg Oral Daily  . atenolol  25 mg Oral Daily  . colesevelam  1,875 mg Oral BID  . docusate sodium  100 mg Oral BID  . fluticasone  2 spray Each Nare Daily  . furosemide  40 mg Oral BID  . gabapentin  200 mg Oral TID  . HYDROmorphone      . imipenem-cilastatin  500 mg Intravenous Q6H  . insulin aspart  0-15 Units Subcutaneous TID WC  . insulin aspart  0-5 Units Subcutaneous QHS  . insulin aspart  4 Units Subcutaneous TID WC  . insulin glargine  40 Units Subcutaneous Daily  . insulin glargine  45 Units Subcutaneous QHS  . metFORMIN  1,000 mg Oral Custom  . polyethylene glycol  17 g Oral Daily  . potassium  chloride SA  20 mEq Oral QODAY  . sodium chloride  10-40 mL Intracatheter Q12H  . sorbitol  30 mL Oral Once  . vitamin B-12  1,000 mcg Oral Daily  . vitamin C  500 mg Oral TID  . DISCONTD: atenolol  25 mg Oral Once     Constipation  Sorbitol     Left diabetic foot with abscess  -Currently status post incision and drainage  -Wound care will be directed by the primary service. MSSA and enterococus   Diabetes  -For now agree with sliding scale insulin and continue metformin.  -Increased Lantus continue CBG's, will titrate as tolerate it.  3. Hypertension  -Continue with atenolol and Lasix as ordered.  4. Dyslipidemia  -Continue with Welchol.  5. History of CVA/coronary disease  -Continue with aspirin   DVT Prophylaxis:  SCDs ordered by the orthopedic service.  Code Status:  Full code     LOS: 5 days   Joh Rao M.D. Pager: [phone] [facility] 04/18/2012, 6:44 PM

## 2012-04-19 ENCOUNTER — Telehealth: Payer: Self-pay | Admitting: *Deleted

## 2012-04-19 ENCOUNTER — Encounter (HOSPITAL_COMMUNITY): Payer: Self-pay | Admitting: Orthopedic Surgery

## 2012-04-19 DIAGNOSIS — L02619 Cutaneous abscess of unspecified foot: Secondary | ICD-10-CM

## 2012-04-19 DIAGNOSIS — E1169 Type 2 diabetes mellitus with other specified complication: Principal | ICD-10-CM

## 2012-04-19 LAB — GLUCOSE, CAPILLARY: Glucose-Capillary: 256 mg/dL — ABNORMAL HIGH (ref 70–99)

## 2012-04-19 MED ORDER — GABAPENTIN 300 MG PO CAPS: 300.0000 mg | ORAL_CAPSULE | Freq: Three times a day (TID) | ORAL | Status: DC

## 2012-04-19 MED ORDER — OXYCODONE HCL 5 MG PO TABS: 5.0000 mg | ORAL_TABLET | ORAL | Status: AC | PRN

## 2012-04-19 MED ORDER — SODIUM CHLORIDE 0.9 % IV SOLN: 500.0000 mg | Freq: Four times a day (QID) | INTRAVENOUS | Status: AC

## 2012-04-19 MED ORDER — HEPARIN SOD (PORK) LOCK FLUSH 100 UNIT/ML IV SOLN
250.0000 [IU] | Freq: Every day | INTRAVENOUS | Status: AC
  Administered 2012-04-19: 250 [IU]

## 2012-04-19 MED ORDER — HYDROMORPHONE HCL PF 1 MG/ML IJ SOLN
0.5000 mg | INTRAMUSCULAR | Status: DC | PRN
  Administered 2012-04-19 (×2): 0.5 mg via INTRAVENOUS
  Filled 2012-04-19: qty 1

## 2012-04-19 MED ORDER — HEPARIN SOD (PORK) LOCK FLUSH 100 UNIT/ML IV SOLN
250.0000 [IU] | INTRAVENOUS | Status: DC | PRN
  Filled 2012-04-19: qty 3

## 2012-04-19 NOTE — Progress Notes (Signed)
Pt c/o of more pain in his left foot which is unrelieved by the oxycodone. Norlene Duel, PA notified and an order for dilaudid received with good results for pt.

## 2012-04-19 NOTE — Progress Notes (Signed)
INFECTIOUS DISEASE PROGRESS NOTE  ID: Anthony Holder is a 71 y.o. male with   Active Problems:  DIABETES MELLITUS, TYPE II  HYPERTENSION  CHF  Diabetic infection of left foot  Subjective: Without complaints  Abtx:  Anti-infectives     Start     Dose/Rate Route Frequency Ordered Stop   04/19/12 0000   sodium chloride 0.9 % SOLN 100 mL with imipenem-cilastatin 500 MG SOLR 500 mg        500 mg 200 mL/hr over 30 Minutes Intravenous Every 6 hours 04/19/12 1210 05/03/12 2359   04/16/12 1200   imipenem-cilastatin (PRIMAXIN) 500 mg in sodium chloride 0.9 % 100 mL IVPB        500 mg 200 mL/hr over 30 Minutes Intravenous 4 times per day 04/16/12 1110     04/14/12 1400   ceFEPIme (MAXIPIME) 2 g in dextrose 5 % 50 mL IVPB  Status:  Discontinued        2 g 100 mL/hr over 30 Minutes Intravenous 3 times per day 04/14/12 1205 04/16/12 1006   04/14/12 1400   metroNIDAZOLE (FLAGYL) tablet 500 mg  Status:  Discontinued        500 mg Oral 3 times per day 04/14/12 1205 04/16/12 1006   04/13/12 1945   piperacillin-tazobactam (ZOSYN) IVPB 3.375 g  Status:  Discontinued        3.375 g 12.5 mL/hr over 240 Minutes Intravenous Every 8 hours 04/13/12 1748 04/14/12 1205   04/13/12 1800   piperacillin-tazobactam (ZOSYN) IVPB 3.375 g  Status:  Discontinued        3.375 g 12.5 mL/hr over 240 Minutes Intravenous Every 8 hours 04/13/12 1748 04/13/12 1812   04/13/12 1515   piperacillin-tazobactam (ZOSYN) IVPB 3.375 g  Status:  Discontinued        3.375 g 12.5 mL/hr over 240 Minutes Intravenous  Once 04/13/12 1505 04/13/12 1812   04/13/12 1240   ceFAZolin (ANCEF) IVPB 2 g/50 mL premix  Status:  Discontinued        2 g 100 mL/hr over 30 Minutes Intravenous 60 min pre-op 04/13/12 1240 04/13/12 1614   04/13/12 1237   ceFAZolin (ANCEF) IVPB 1 g/50 mL premix  Status:  Discontinued        1 g 100 mL/hr over 30 Minutes Intravenous 60 min pre-op 04/13/12 1237 04/13/12 1240          Medications:    Scheduled:   . aspirin EC  81 mg Oral Daily  . atenolol  25 mg Oral Daily  . colesevelam  1,875 mg Oral BID  . docusate sodium  100 mg Oral BID  . fluticasone  2 spray Each Nare Daily  . furosemide  40 mg Oral BID  . gabapentin  200 mg Oral TID  . heparin lock flush  250 Units Intracatheter Daily  . HYDROmorphone      . imipenem-cilastatin  500 mg Intravenous Q6H  . insulin aspart  0-15 Units Subcutaneous TID WC  . insulin aspart  0-5 Units Subcutaneous QHS  . insulin aspart  4 Units Subcutaneous TID WC  . insulin glargine  40 Units Subcutaneous Daily  . insulin glargine  45 Units Subcutaneous QHS  . metFORMIN  1,000 mg Oral Custom  . polyethylene glycol  17 g Oral Daily  . potassium chloride SA  20 mEq Oral QODAY  . sodium chloride  10-40 mL Intracatheter Q12H  . sorbitol  30 mL Oral Once  . vitamin B-12  1,000 mcg  Oral Daily  . vitamin C  500 mg Oral TID  . DISCONTD: atenolol  25 mg Oral Once    Objective: Vital signs in last 24 hours: Temp:  [97 F (36.1 C)-98.1 F (36.7 C)] 97 F (36.1 C) (05/01 0528) Pulse Rate:  [66-85] 75  (05/01 0528) Resp:  [11-21] 20  (05/01 0528) BP: (111-139)/(49-76) 124/76 mmHg (05/01 0528) SpO2:  [97 %-100 %] 99 % (05/01 0528)   General appearance: alert, cooperative and no distress Incision/Wound: L foot with small vac in place between toes.   Lab Results No results found for this basename: WBC:2,HGB:2,HCT:2,PLATELETS:2,NA:2,K:2,CL:2,CO2:2,BUN:2,CREATININE:2,GLU:2 in the last 72 hours Liver Panel No results found for this basename: PROT:2,ALBUMIN:2,AST:2,ALT:2,ALKPHOS:2,BILITOT:2,BILIDIR:2,IBILI:2 in the last 72 hours Sedimentation Rate No results found for this basename: ESRSEDRATE in the last 72 hours C-Reactive Protein No results found for this basename: CRP:2 in the last 72 hours  Microbiology: Recent Results (from the past 240 hour(s))  SURGICAL PCR SCREEN     Status: Normal   Collection Time   04/13/12 12:33 PM       Component Value Range Status Comment   MRSA, PCR NEGATIVE  NEGATIVE  Final    Staphylococcus aureus NEGATIVE  NEGATIVE  Final   TISSUE CULTURE     Status: Normal   Collection Time   04/13/12  3:57 PM      Component Value Range Status Comment   Specimen Description TISSUE FOOT LEFT   Final    Special Requests PT ON ZOSYN   Final    Gram Stain     Final    Value: RARE WBC PRESENT,BOTH PMN AND MONONUCLEAR     NO SQUAMOUS EPITHELIAL CELLS SEEN     NO ORGANISMS SEEN   Culture     Final    Value: MODERATE STAPHYLOCOCCUS AUREUS     Note: RIFAMPIN AND GENTAMICIN SHOULD NOT BE USED AS SINGLE DRUGS FOR TREATMENT OF STAPH INFECTIONS. This organism DOES NOT demonstrate inducible Clindamycin resistance in vitro.     MODERATE ENTEROCOCCUS SPECIES   Report Status 04/17/2012 FINAL   Final    Organism ID, Bacteria STAPHYLOCOCCUS AUREUS   Final    Organism ID, Bacteria ENTEROCOCCUS SPECIES   Final   ANAEROBIC CULTURE     Status: Normal   Collection Time   04/13/12  3:57 PM      Component Value Range Status Comment   Specimen Description TISSUE FOOT LEFT   Final    Special Requests PT ON ZOSYN   Final    Gram Stain     Final    Value: RARE WBC PRESENT,BOTH PMN AND MONONUCLEAR     NO SQUAMOUS EPITHELIAL CELLS SEEN     NO ORGANISMS SEEN   Culture NO ANAEROBES ISOLATED   Final    Report Status 04/18/2012 FINAL   Final     Studies/Results: No results found.   Assessment/Plan: Osteomyelitis I & D 4-30, VAC placement Diabetes Cx- MSSA, Amp sensitive enterococcus Plan for at least 1 month of anbx (Imipenem) and to see Dr Daiva Eves prior to d/c anbx. Has ID f/u appt 05-10-12    Johny Sax Infectious Diseases [phone] 04/19/2012, 3:40 PM   LOS: 6 days

## 2012-04-19 NOTE — Telephone Encounter (Signed)
i reviewed his labs and he already had high wbc on dc. He just barely had repeat surgery. He is ok to fu at the time he has on schedule. We will see what his labs look like next week

## 2012-04-19 NOTE — Progress Notes (Signed)
CARE MANAGEMENT NOTE 04/19/2012  Patient:  Anthony Holder, Anthony Holder   Account Number:  [id number]  Date Initiated:  04/18/2012  Documentation initiated by:  Vance Peper  Subjective/Objective Assessment:   71 yr old male s/p I & D of left foot wound with application of skin graft substitute and application of wound vac.     Action/Plan:   Spoke with patients wife regarding home health. They are active with [facility]. Faxed order for wound vac to [facility].   Anticipated DC Date:  04/19/2012   Anticipated DC Plan:  HOME W HOME HEALTH SERVICES      DC Planning Services  CM consult      [facility] Choice  Resumption Of Svcs/PTA Provider  HOME HEALTH   Choice offered to / List presented to:  C-3 Spouse   DME arranged  VAC        HH arranged  HH-1 RN      [facility] agency  [facility]   Status of service:  Completed, signed off  Discharge Disposition:  HOME W HOME HEALTH SERVICES  Comments:  04/19/12 0940 Vance Peper, RN [name] Nurse Case Manager [phone] Received call from [facility] with [facility], Wound Vac has been released and will be delivered to patient's room within 4-5 hours.Case Manager notified  patient's bedside nurse.

## 2012-04-19 NOTE — Discharge Summary (Signed)
Physician Discharge Summary  Patient ID: Notnamed Scholz MRN: [id number] DOB/AGE: 70-Feb-1942 70 y.o.  Admit date: 04/13/2012 Discharge date: 04/19/2012  Admission Diagnoses:  Diabetes, htn, CAD, gangrene  Discharge Diagnoses:  Active Problems:  DIABETES MELLITUS, TYPE II  HYPERTENSION  CHF  Diabetic infection of left foot   Discharged Condition: stable  Hospital Course: Pt was admitted and taken to the OR for I and D x 3 and finally ended up having ACEL graft placed with a wound VAC.  He is discharged to home in stable condition with HH IV abx and wound VAC change to be done at the wound center by dr. Kelly Splinter.  Consults: ID  Significant Diagnostic Studies: microbiology: wound culture: positive  Treatments: surgery: I and D with VAC x 3.  Discharge Exam: Blood pressure 124/76, pulse 75, temperature 97 F (36.1 C), temperature source Oral, resp. rate 20, height 5\' 10"  (1.778 m), weight 91.627 kg (202 lb), SpO2 99.00%. Wound vac on and functioning well at 75 mm Hg.  Toes perfused.  Hard shoe in place.  Disposition: 01-Home or Self Care  Discharge Orders    Future Appointments: Provider: Department: Dept Phone: Center:   05/10/2012 2:00 PM Randall Hiss, MD [facility] [phone] [facility]   07/25/2012 1:30 PM [facility] [phone] [facility]     Future Orders Please Complete By Expires   Diet - low sodium heart healthy      Call MD / Call 911      Comments:   If you experience chest pain or shortness of breath, CALL 911 and be transported to the hospital emergency room.  If you develope a fever above 101 F, pus (white drainage) or increased drainage or redness at the wound, or calf pain, call your surgeon's office.   Constipation Prevention      Comments:   Drink plenty of fluids.  Prune juice may be helpful.  You may use a stool softener, such as Colace (over the counter) 100 mg twice a day.  Use MiraLax (over the counter) for constipation as needed.   Increase activity slowly as tolerated        Medication List  As of 04/19/2012 12:10 PM   STOP taking these medications         DSS 100 MG Caps      HYDROcodone-acetaminophen 5-325 MG per tablet         TAKE these medications         aspirin EC 81 MG tablet   Take 81 mg by mouth daily.      atenolol 50 MG tablet   Commonly known as: TENORMIN   Take 25 mg by mouth daily. Patient takes 1/2 tablet      colesevelam 625 MG tablet   Commonly known as: WELCHOL   Take 1,875 mg by mouth 2 (two) times daily.      fluticasone 50 MCG/ACT nasal spray   Commonly known as: FLONASE   Place 2 sprays into the nose daily as needed. As needed for allergy congestion      furosemide 40 MG tablet   Commonly known as: LASIX   Take 40 mg by mouth 2 (two) times daily.      gabapentin 300 MG capsule   Commonly known as: NEURONTIN   Take 1 capsule (300 mg total) by mouth 3 (three) times daily.      lactobacillus acidophilus Tabs   Take 2 tablets by mouth 3 (three) times daily  as needed. For constipation      MAG OXIDE-VIT D3-TURMERIC PO   Take 2 capsules by mouth 2 (two) times daily.      metFORMIN 500 MG 24 hr tablet   Commonly known as: GLUCOPHAGE-XR   Take 1,000 mg by mouth 2 (two) times daily.      oxyCODONE 5 MG immediate release tablet   Commonly known as: Oxy IR/ROXICODONE   Take 1-2 tablets (5-10 mg total) by mouth every 4 (four) hours as needed for pain.      piperacillin-tazobactam 3-0.375 GM/50ML IVPB   Commonly known as: ZOSYN   Inject 3.375 g into the vein every 8 (eight) hours. Use for 2 weeks      potassium chloride SA 20 MEQ tablet   Commonly known as: K-DUR,KLOR-CON   Take 20 mEq by mouth every other day.      sodium chloride 0.9 % SOLN 100 mL with imipenem-cilastatin 500 MG SOLR 500 mg   Inject 500 mg into the vein every 6 (six) hours.      vitamin B-12 500 MCG tablet   Commonly known as: CYANOCOBALAMIN   Take 500 mcg by mouth 2 (two) times daily.      vitamin C  500 MG tablet   Commonly known as: ASCORBIC ACID   Take 500 mg by mouth 3 (three) times daily.           Follow-up Information    Follow up with Toni Arthurs, MD in 2 weeks.   Contact information:   [address], [address] [place] [address] [phone]       Follow up with [facility], DO in 1 week.   Contact information:   [address] [place] [address] [phone]       Follow up with Johny Sax, MD in 4 weeks.   Contact information:   [address]  [address] [place] [address] [phone]          Signed: Toni Arthurs 04/19/2012, 12:10 PM

## 2012-04-19 NOTE — Discharge Instructions (Addendum)
Home Health to be provided by [facility] [phone]  First wound VAC change by Dr. Kelly Splinter in a week at the Wound Center at [facility].  Call for an appointment.  Toni Arthurs, MD [facility]  Please read the following information regarding your care after surgery.  Medications  You only need a prescription for the narcotic pain medicine (ex. oxycodone, Percocet, Norco).  All of the other medicines listed below are available over the counter. X acetominophen (Tylenol) 650 mg every 4-6 hours as you need for minor pain X oxycodone as prescribed for moderate to severe pain ?   Narcotic pain medicine (ex. oxycodone, Percocet, Vicodin) will cause constipation.  To prevent this problem, take the following medicines while you are taking any pain medicine. X docusate sodium (Colace) 100 mg twice a day X senna (Senokot) 2 tablets twice a day  ? To help prevent blood clots, take an aspirin (325 mg) once a day for a month after surgery.  You should also get up every hour while you are awake to move around.    Weight Bearing ? Bear weight when you are able on your operated leg or foot. X Bear weight only on the heel of your operated foot in the post-op shoe. ? Do not bear any weight on the operated leg or foot.  Cast / Splint / Dressing X Keep your splint or cast clean and dry.  Don't put anything (coat hanger, pencil, etc) down inside of it.  If it gets damp, use a hair dryer on the cool setting to dry it.  If it gets soaked, call the office to schedule an appointment for a cast change. ? Remove your dressing 3 days after surgery and cover the incisions with dry dressings.    After your dressing, cast or splint is removed; you may shower, but do not soak or scrub the wound.  Allow the water to run over it, and then gently pat it dry.  Swelling It is normal for you to have swelling where you had surgery.  To reduce swelling and pain, keep your toes above your nose for at  least 3 days after surgery.  It may be necessary to keep your foot or leg elevated for several weeks.  If it hurts, it should be elevated.  Follow Up Call my office at [phone] when you are discharged from the hospital or surgery center to schedule an appointment to be seen two weeks after surgery.  Call my office at [phone] if you develop a fever >101.5 F, nausea, vomiting, bleeding from the surgical site or severe pain.    Appointment at the wound center with Dr. Kelly Splinter on Tuesday, 04/25/12 @ 9:00AM

## 2012-04-19 NOTE — Telephone Encounter (Signed)
Dr. Daiva Eves, this was given to [facility] as the patient has not established at the clinic yet.  She will arrange for him to be seen sooner, by the whichever MD has an available slot. Wendall Mola CMA

## 2012-04-19 NOTE — Progress Notes (Signed)
CARE MANAGEMENT NOTE 04/19/2012  Comments:  04/19/12 14:00 Vance Peper, RN [name] Nurse Case Manager Called Wound Center, spoke with Karen-Dr. Leonie Green nurse regarding appointment for Mr. Cateret. Appointment is for 04/25/12 @ 9:00 am. Per her request informed patient that he may have a short wait as he is an add on patient. Patient has no problem with this.

## 2012-04-20 NOTE — Op Note (Signed)
NAMECHELSEA, NUSZ NO.:  [id number]  MEDICAL RECORD NO.:  [id number]  LOCATION:  [facility]                        FACILITY:  [facility]  PHYSICIAN:  Toni Arthurs, MD        DATE OF BIRTH:  1941-10-24  DATE OF PROCEDURE:  04/18/2012 DATE OF DISCHARGE:                              OPERATIVE REPORT   PREOPERATIVE DIAGNOSIS:  Left foot diabetic ulcer and plantar abscess.  POSTOPERATIVE DIAGNOSIS:  Left foot diabetic ulcer and plantar abscess.  PROCEDURES: 1. Irrigation and debridement of left foot abscess and diabetic ulcer     (multiple areas). 2. Application of skin graft substitute to left foot wound (4 cm x 10     cm x 1 cm). 3. Application of wound VAC dressing (4 cm x 10 cm x 1 cm).  SURGEON:  Toni Arthurs, MD  ANESTHESIA:  General.  ESTIMATED BLOOD LOSS:  Minimal.  TOURNIQUET TIME:  Zero.  COMPLICATIONS:  None apparent.  DISPOSITION:  Extubated, awake, and stable to recovery.  INDICATIONS FOR PROCEDURE:  The patient is a 71 year old male who is now almost 3 weeks out from amputation of his left second toe.  This was complicated by a postoperative plantar abscess.  The patient has been taken to the operating room twice already for irrigation and debridement with wound VAC changes.  He presents now for a repeat irrigation and debridement of his foot wound with possible Ch[place] versus BK amputation or application of skin graft substitute.  He understands the risks and benefits of the alternative treatment options and elects surgical treatment.  He specifically understands the risks of bleeding, infection, nerve damage, blood clots, need for additional surgery, revision amputation, and death.  PROCEDURE IN DETAIL:  After preoperative consent was obtained and the correct operative site was identified, the patient was brought to the operating room and placed supine on the operating table.  General anesthesia was induced.  Preoperative antibiotics were  administered. Surgical time-out was taken.  Left lower extremity was prepped and draped in standard sterile fashion.  The patient's previous wound VAC dressing had been removed.  At this point, there was no significant necrotic tissue.  There was an area of somewhat dusky tissue on the plantar surface of the foot.  This was a bridge of about 1 cm between the plantar surface under the first and third metatarsal heads.  The decision was made at this point to proceed with partial closure of the wound and application of skin graft substitute and repeat application of the wound VAC, and the continued course of limb salvage.  There was no gross purulence noted.  At the plantar surface of the wound, there were some granulation tissue coming in from the edges.  There was some fibrinous exudate noted.  The wound was then circumferentially debrided from the level of the skin down to the level of the bone including multiple areas deep to the fascia in the plantar aspect of the arch. All necrotic-appearing or fibrinous tissue was removed.  The second metatarsal was shortened a few millimeters again to leave a fresh bony cut for healing.  Wound was then irrigated copiously with 3 liters of normal saline.  All  the remaining tissue appeared viable.  The 2-0 nylon vertical and horizontal mattress sutures were used to close the second toe wound at the level of the webspace dorsally and extending to the plantar aspect of the foot.  This then yielded a wound on the plantar surface of the foot that measured 10 cm x 4 cm x 1 cm.  The ACell graft was then cut to fit the wound.  It was stitched in place with 5-0 Vicryl suture and was perforated several times with the #15 blade.  Prior to application of this graft, powdered ACell was placed in the wound to cover all surfaces.  The graft was then covered with Adaptic and it was hydrated with Surg-I-Loop.  The Adaptic was stapled in place.  The wound VAC sponge  was then cut to fit the wound and was applied.  The occlusive wound VAC dressings were applied and an appropriate seal was achieved at 75 mmHg.  The patient was then awakened from anesthesia and transported to the recovery room in stable condition.  FOLLOWUP PLAN:  The patient will be discharged to home in the next day or 2 with the wound VAC in place.  He will follow up in a week for a wound VAC change and possible application of more ACell.     Toni Arthurs, MD     JH/MEDQ  D:  04/18/2012  T:  04/18/2012  Job:  [id number]

## 2012-04-26 ENCOUNTER — Encounter (HOSPITAL_BASED_OUTPATIENT_CLINIC_OR_DEPARTMENT_OTHER): Payer: Medicare Other | Attending: Plastic Surgery

## 2012-04-26 DIAGNOSIS — M869 Osteomyelitis, unspecified: Secondary | ICD-10-CM | POA: Insufficient documentation

## 2012-04-26 DIAGNOSIS — E1169 Type 2 diabetes mellitus with other specified complication: Secondary | ICD-10-CM | POA: Insufficient documentation

## 2012-04-26 DIAGNOSIS — I739 Peripheral vascular disease, unspecified: Secondary | ICD-10-CM | POA: Insufficient documentation

## 2012-04-26 DIAGNOSIS — L97509 Non-pressure chronic ulcer of other part of unspecified foot with unspecified severity: Secondary | ICD-10-CM | POA: Insufficient documentation

## 2012-04-26 DIAGNOSIS — S98139A Complete traumatic amputation of one unspecified lesser toe, initial encounter: Secondary | ICD-10-CM | POA: Insufficient documentation

## 2012-04-26 DIAGNOSIS — M9080 Osteopathy in diseases classified elsewhere, unspecified site: Secondary | ICD-10-CM | POA: Insufficient documentation

## 2012-04-26 NOTE — Progress Notes (Signed)
[facility]  NAME:  Anthony Holder, Anthony Holder NO.:  [id number]  MEDICAL RECORD NO.:  [id number]      DATE OF BIRTH:  07-Apr-1941  PHYSICIAN:  Wayland Denis, DO       VISIT DATE:  04/26/2012                                  OFFICE VISIT   The patient is a 71 year old male who has had a left foot osteo and wound infection with the bone exposed.  He was taken to the operating room by Dr. Victorino Dike for resection of the infected bone and fortunately, Dr. Victorino Dike was able to resolve the infection portion of it.  Now, it includes the plantar aspect of his foot between the 1st and 2nd toe. ACell was placed with the VAC.  There is no change in his medications or social history.  He is at home with his wife.  On exam, he is alert, oriented, cooperative, not in any acute distress, pleasant.  Pupils equal.  Extraocular muscles intact.  No cervical lymphadenopathy.  Breathing is unlabored.  Heart is regular.  Abdomen is soft.  The periwound area has improved.  There is some scabbing of the skin and dry areas, but overall no sign of infection.  Minimal swelling.  The ACell is incorporating.  There is no purulence or drainage.  We will continue with the Adaptic, Hydrogel, and VAC, and have him follow up in 1 week.     Wayland Denis, DO     CS/MEDQ  D:  04/26/2012  T:  04/26/2012  Job:  [id number]

## 2012-04-28 LAB — GLUCOSE, CAPILLARY: Glucose-Capillary: 188 mg/dL — ABNORMAL HIGH (ref 70–99)

## 2012-05-02 LAB — GLUCOSE, CAPILLARY: Glucose-Capillary: 279 mg/dL — ABNORMAL HIGH (ref 70–99)

## 2012-05-04 ENCOUNTER — Telehealth: Payer: Self-pay | Admitting: Licensed Clinical Social Worker

## 2012-05-04 LAB — GLUCOSE, CAPILLARY
Glucose-Capillary: 217 mg/dL — ABNORMAL HIGH (ref 70–99)
Glucose-Capillary: 168 mg/dL — ABNORMAL HIGH (ref 70–99)

## 2012-05-04 NOTE — Telephone Encounter (Signed)
[facility] called wanting an order to pull pic because she had and order that  antibiotics ended today. Per Dr. Daiva Eves he wants the patient to stay on antibiotics until his appointment on 5/22. I called [facility] to extend the orders.

## 2012-05-05 LAB — GLUCOSE, CAPILLARY
Glucose-Capillary: 313 mg/dL — ABNORMAL HIGH (ref 70–99)
Glucose-Capillary: 243 mg/dL — ABNORMAL HIGH (ref 70–99)

## 2012-05-05 NOTE — Progress Notes (Signed)
[facility]  NAME:  Anthony Holder, ASHBY NO.:  MEDICAL RECORD NO.:  [id number]      DATE OF BIRTH:  Mar 10, 1941  PHYSICIAN:  Wayland Denis, DO       VISIT DATE:  05/03/2012                                  OFFICE VISIT   Mr. Fill is a 71 year old white male, who is here for re-evaluation of his left lower extremity foot ulcer on the plantar aspect.  He underwent amputation of his second toe with a ray included and ended up with an open wound.  The dimensions are noted in the nurse's notes.  He had ACell placed and is incorporating it well.  There does not appear to be infection.  He had some anterior or dorsal irritation to the skin from the pressure of the [facility] and this was addressed with changing it to some Santyl on that area.  He does tunnel quite a bit anteriorly from the plantar to dorsal aspect, so there is still quite a bit of deep dead space.  His medications are unchanged.  His social history is unchanged. Review of systems otherwise negative.  On exam, he is alert, oriented, cooperative, not in any acute distress. He is pleasant.  Breathing is unlabored.  Heart is regular.  The wound is described above.  We will continue with the [facility], and we will schedule him for surgery for more ACell placement.     Wayland Denis, DO     CS/MEDQ  D:  05/03/2012  T:  05/03/2012  Job:  [id number]

## 2012-05-08 LAB — GLUCOSE, CAPILLARY: Glucose-Capillary: 257 mg/dL — ABNORMAL HIGH (ref 70–99)

## 2012-05-09 ENCOUNTER — Telehealth: Payer: Self-pay | Admitting: *Deleted

## 2012-05-09 DIAGNOSIS — E1169 Type 2 diabetes mellitus with other specified complication: Secondary | ICD-10-CM | POA: Diagnosis not present

## 2012-05-09 DIAGNOSIS — M869 Osteomyelitis, unspecified: Secondary | ICD-10-CM | POA: Diagnosis not present

## 2012-05-09 DIAGNOSIS — L97509 Non-pressure chronic ulcer of other part of unspecified foot with unspecified severity: Secondary | ICD-10-CM | POA: Diagnosis not present

## 2012-05-09 DIAGNOSIS — M9080 Osteopathy in diseases classified elsewhere, unspecified site: Secondary | ICD-10-CM | POA: Diagnosis not present

## 2012-05-09 NOTE — Telephone Encounter (Signed)
Called patient and reminded him of appt with Dr. Daiva Eves for tomorrow. Anthony Holder

## 2012-05-10 ENCOUNTER — Ambulatory Visit (INDEPENDENT_AMBULATORY_CARE_PROVIDER_SITE_OTHER): Payer: Medicare Other | Admitting: Infectious Disease

## 2012-05-10 ENCOUNTER — Encounter: Payer: Self-pay | Admitting: Infectious Disease

## 2012-05-10 VITALS — BP 160/96 | HR 93 | Temp 97.7°F | Wt 196.0 lb

## 2012-05-10 DIAGNOSIS — M869 Osteomyelitis, unspecified: Secondary | ICD-10-CM

## 2012-05-10 DIAGNOSIS — A498 Other bacterial infections of unspecified site: Secondary | ICD-10-CM

## 2012-05-10 DIAGNOSIS — A4901 Methicillin susceptible Staphylococcus aureus infection, unspecified site: Secondary | ICD-10-CM

## 2012-05-10 DIAGNOSIS — B952 Enterococcus as the cause of diseases classified elsewhere: Secondary | ICD-10-CM

## 2012-05-10 DIAGNOSIS — A491 Streptococcal infection, unspecified site: Secondary | ICD-10-CM

## 2012-05-10 DIAGNOSIS — B961 Klebsiella pneumoniae [K. pneumoniae] as the cause of diseases classified elsewhere: Secondary | ICD-10-CM

## 2012-05-10 LAB — COMPLETE METABOLIC PANEL WITH GFR
BUN: 11 mg/dL (ref 6–23)
CO2: 28 mEq/L (ref 19–32)
Creat: 0.87 mg/dL (ref 0.50–1.35)
GFR, Est African American: >89 mL/min
GFR, Est Non African American: 88 mL/min
Glucose, Bld: 210 mg/dL — ABNORMAL HIGH (ref 70–99)
Total Bilirubin: 0.4 mg/dL (ref 0.3–1.2)

## 2012-05-10 MED ORDER — AMOXICILLIN-POT CLAVULANATE 875-125 MG PO TABS: 1.0000 | ORAL_TABLET | Freq: Two times a day (BID) | ORAL | Status: AC

## 2012-05-10 MED ORDER — SULFAMETHOXAZOLE-TRIMETHOPRIM 800-160 MG PO TABS: 1.0000 | ORAL_TABLET | Freq: Two times a day (BID) | ORAL | Status: AC

## 2012-05-10 NOTE — Assessment & Plan Note (Signed)
See above, will be on augmentin

## 2012-05-10 NOTE — Progress Notes (Signed)
Subjective:    Patient ID: Anthony Holder, male    DOB: 01/28/1941, 71 y.o.   MRN: [id number]  HPI  71 y.o. malewith gangrene sp amputation now with diabetic foot infection with deep abscess sp I and D Prior cultures with MSSA and Klebsietlla PNA now with MSSA and AMp Sens enterococcus on culture. Antibiotics changed to imipenem he went back again for 1. Irrigation and debridement of left foot abscess and diabetic ulcer  (multiple areas). 2. Application of skin graft substitute to left foot wound (4 cm x 10 cm x 1 cm). 3. Application of wound VAC dressing (4 cm x 10 cm x 1 cm) on 43013> he has continued on imipenem q 6 hours since then. He has been followed closely by Dr. Victorino Dike and Dr. Kelly Splinter. He still has wound vacuum and has had further application of material to wound bed. He states that there was breakdown of part of the wound and that Dr. Kelly Splinter had removed some stitches. He still has drainge from surgical site. He is without fevers, chills nausea or malaise. We could not examine the wound due to his vacuum dressing being deployed today. He is in doughnut hole for meds and would greatly appreciate change to po abx. I will therefore switch him to po augmentin and DS bactrim.    Review of Systems  Constitutional: Negative for fever, chills, diaphoresis, activity change, appetite change, fatigue and unexpected weight change.  HENT: Negative for congestion, sore throat, rhinorrhea, sneezing, trouble swallowing and sinus pressure.   Eyes: Negative for photophobia and visual disturbance.  Respiratory: Negative for cough, chest tightness, shortness of breath, wheezing and stridor.   Cardiovascular: Negative for chest pain, palpitations and leg swelling.  Gastrointestinal: Negative for nausea, vomiting, abdominal pain, diarrhea, constipation, blood in stool, abdominal distention and anal bleeding.  Genitourinary: Negative for dysuria, hematuria, flank pain and difficulty urinating.  Musculoskeletal:  Negative for myalgias, back pain, joint swelling, arthralgias and gait problem.  Skin: Positive for wound. Negative for color change, pallor and rash.  Neurological: Negative for dizziness, tremors, weakness and light-headedness.  Hematological: Negative for adenopathy. Does not bruise/bleed easily.  Psychiatric/Behavioral: Negative for behavioral problems, confusion, sleep disturbance, dysphoric mood, decreased concentration and agitation.       Objective:   Physical Exam  Constitutional: He is oriented to person, place, and time. He appears well-developed and well-nourished. No distress.  HENT:  Head: Normocephalic and atraumatic.  Mouth/Throat: Oropharynx is clear and moist. No oropharyngeal exudate.  Eyes: Conjunctivae and EOM are normal. Pupils are equal, round, and reactive to light. No scleral icterus.  Neck: Normal range of motion. Neck supple.  Cardiovascular: Normal rate, regular rhythm and normal heart sounds.  Exam reveals no gallop and no friction rub.   No murmur heard. Pulmonary/Chest: Effort normal and breath sounds normal. No respiratory distress. He has no wheezes. He has no rales. He exhibits no tenderness.  Abdominal: He exhibits no distension and no mass. There is no tenderness. There is no rebound and no guarding.  Musculoskeletal: He exhibits no edema and no tenderness.       Feet:  Lymphadenopathy:    He has no cervical adenopathy.  Neurological: He is alert and oriented to person, place, and time. He has normal reflexes. He exhibits normal muscle tone. Coordination normal.  Skin: He is not diaphoretic. No pallor.     Psychiatric: He has a normal mood and affect. His behavior is normal. Judgment and thought content normal.  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 Polymicrobial with AMp Sensitive enterococcus, MSSA, and Kleb PNA. Will change him to twice daily augmentin and twice daily bactrim DS and continue  therapy with fu with [facility] MD in one month. He may be going for further surgery with Dr. Kelly Splinter in Early june  MSSA (methicillin susceptible Staphylococcus aureus) infection See above, will be on augmentin  Enterococcal infection See above, will be on augmentin  Klebsiella infection Will cover with Bactrim DS since was R to augmentin

## 2012-05-10 NOTE — Assessment & Plan Note (Signed)
See above, will be on augmentin 

## 2012-05-10 NOTE — Assessment & Plan Note (Signed)
Sp multiple surgeries. Polymicrobial with AMp Sensitive enterococcus, MSSA, and Kleb PNA. Will change him to twice daily augmentin and twice daily bactrim DS and continue therapy with fu with [facility] MD in one month. He may be going for further surgery with Dr. Kelly Splinter in Early june

## 2012-05-10 NOTE — Assessment & Plan Note (Signed)
Will cover with Bactrim DS since was R to augmentin

## 2012-05-10 NOTE — Patient Instructions (Signed)
We need to get blood work today  We will then continue the current antibiotics thru next Tuesday  Then next Wednesday i want you to start  augmentin one tablet twice daily   And   Bactrim ds tablet twice daily  Please make appt with myself or [facility] MD in one month

## 2012-05-11 LAB — CBC WITH DIFFERENTIAL/PLATELET
Eosinophils Absolute: 0.4 10*3/uL (ref 0.0–0.7)
Eosinophils Relative: 4 % (ref 0–5)
Lymphs Abs: 2.9 10*3/uL (ref 0.7–4.0)
MCH: 27.2 pg (ref 26.0–34.0)
MCV: 86.1 fL (ref 78.0–100.0)
Monocytes Absolute: 1.1 10*3/uL — ABNORMAL HIGH (ref 0.1–1.0)
Monocytes Relative: 11 % (ref 3–12)
Platelets: 278 10*3/uL (ref 150–400)
RBC: 4.38 MIL/uL (ref 4.22–5.81)

## 2012-05-12 LAB — GLUCOSE, CAPILLARY
Glucose-Capillary: 212 mg/dL — ABNORMAL HIGH (ref 70–99)
Glucose-Capillary: 260 mg/dL — ABNORMAL HIGH (ref 70–99)

## 2012-05-12 LAB — AFB CULTURE WITH SMEAR (NOT AT ARMC): Acid Fast Smear: NONE SEEN

## 2012-05-18 LAB — GLUCOSE, CAPILLARY
Glucose-Capillary: 205 mg/dL — ABNORMAL HIGH (ref 70–99)
Glucose-Capillary: 166 mg/dL — ABNORMAL HIGH (ref 70–99)

## 2012-05-19 LAB — GLUCOSE, CAPILLARY
Glucose-Capillary: 227 mg/dL — ABNORMAL HIGH (ref 70–99)
Glucose-Capillary: 192 mg/dL — ABNORMAL HIGH (ref 70–99)

## 2012-05-22 ENCOUNTER — Encounter (HOSPITAL_BASED_OUTPATIENT_CLINIC_OR_DEPARTMENT_OTHER): Payer: Medicare Other | Attending: Plastic Surgery

## 2012-05-22 DIAGNOSIS — L97509 Non-pressure chronic ulcer of other part of unspecified foot with unspecified severity: Secondary | ICD-10-CM | POA: Insufficient documentation

## 2012-05-22 DIAGNOSIS — E1169 Type 2 diabetes mellitus with other specified complication: Secondary | ICD-10-CM | POA: Insufficient documentation

## 2012-05-22 LAB — GLUCOSE, CAPILLARY
Glucose-Capillary: 231 mg/dL — ABNORMAL HIGH (ref 70–99)
Glucose-Capillary: 196 mg/dL — ABNORMAL HIGH (ref 70–99)

## 2012-05-22 NOTE — Progress Notes (Signed)
[facility]  NAME:  Anthony Holder, Anthony Holder NO.:  [id number]  MEDICAL RECORD NO.:  [id number]      DATE OF BIRTH:  1941/11/13  PHYSICIAN:  Wayland Denis, DO       VISIT DATE:  05/22/2012                                  OFFICE VISIT   The patient is a 71 year old male, who is here for followup on his left lower extremity ulcer.  We have been working with Dr. Victorino Dike.  He had quite a bit of fibrous tissue that was debrided last week and he does have some granulation tissue in the mid, but he has quite a bit of his metatarsal head showing on the great toe.  This is very concerning for this leading to osteomyelitis.  At this point, I am doubtful that this is salvageable and I am willing to give it a try, but if we debride down to the bone, it is essentially likely not going to be functional because we are going to have to debride away the head of that metatarsal.  There has been no other change in his medications or social history.  On exam, he is alert, oriented, cooperative, not in any acute distress. He is pleasant.  Pupils are equal.  Extraocular muscles are intact.  No cervical lymphadenopathy.  His breathing is unlabored.  His heart is regular.  The wound is as described above.  I did send a note to Dr. Victorino Dike and attempts will be made to see the patient on Wednesday.  He is likely headed towards a BKA.  This was discussed with the patient in detail and I did all I could to prepare him for that possibility and hear those words from Dr. Victorino Dike.  In the meantime, we will continue with the Santyl, Hydrogel and the wound VAC.  I will have him follow up in a week unless we hear otherwise from Dr. Victorino Dike.     Wayland Denis, DO     CS/MEDQ  D:  05/22/2012  T:  05/22/2012  Job:  [id number]

## 2012-05-24 ENCOUNTER — Encounter (HOSPITAL_BASED_OUTPATIENT_CLINIC_OR_DEPARTMENT_OTHER): Payer: Self-pay | Admitting: *Deleted

## 2012-05-24 ENCOUNTER — Encounter (HOSPITAL_BASED_OUTPATIENT_CLINIC_OR_DEPARTMENT_OTHER)
Admission: RE | Admit: 2012-05-24 | Discharge: 2012-05-24 | Disposition: A | Discharge status: Home / Self Care | Payer: Medicare Other | Source: Ambulatory Visit | Attending: Orthopedic Surgery | Admitting: Orthopedic Surgery

## 2012-05-24 LAB — BASIC METABOLIC PANEL
CO2: 23 mEq/L (ref 19–32)
Chloride: 102 mEq/L (ref 96–112)
Glucose, Bld: 270 mg/dL — ABNORMAL HIGH (ref 70–99)
Potassium: 5 mEq/L (ref 3.5–5.1)
Sodium: 135 mEq/L (ref 135–145)

## 2012-05-24 NOTE — Progress Notes (Signed)
bmet done-to bring all vac supplies

## 2012-05-24 NOTE — Progress Notes (Signed)
Notified Dr. Shelby Mattocks of Pt's glucose 270 today from labs, may 22nd,glucose- 210, may 24th- glucose -202. Will recheck  Glucose in am.

## 2012-05-25 ENCOUNTER — Encounter (HOSPITAL_BASED_OUTPATIENT_CLINIC_OR_DEPARTMENT_OTHER): Payer: Self-pay | Admitting: *Deleted

## 2012-05-25 ENCOUNTER — Encounter (HOSPITAL_BASED_OUTPATIENT_CLINIC_OR_DEPARTMENT_OTHER)
Admission: RE | Disposition: A | Discharge status: Home / Self Care | Payer: Self-pay | Source: Ambulatory Visit | Attending: Orthopedic Surgery

## 2012-05-25 ENCOUNTER — Ambulatory Visit (HOSPITAL_BASED_OUTPATIENT_CLINIC_OR_DEPARTMENT_OTHER): Payer: Medicare Other | Admitting: *Deleted

## 2012-05-25 ENCOUNTER — Ambulatory Visit (HOSPITAL_BASED_OUTPATIENT_CLINIC_OR_DEPARTMENT_OTHER)
Admission: RE | Admit: 2012-05-25 | Discharge: 2012-05-25 | Disposition: A | Discharge status: Home / Self Care | Payer: Medicare Other | Source: Ambulatory Visit | Attending: Orthopedic Surgery | Admitting: Orthopedic Surgery

## 2012-05-25 DIAGNOSIS — I69998 Other sequelae following unspecified cerebrovascular disease: Secondary | ICD-10-CM | POA: Insufficient documentation

## 2012-05-25 DIAGNOSIS — E1169 Type 2 diabetes mellitus with other specified complication: Secondary | ICD-10-CM | POA: Insufficient documentation

## 2012-05-25 DIAGNOSIS — L97509 Non-pressure chronic ulcer of other part of unspecified foot with unspecified severity: Secondary | ICD-10-CM | POA: Insufficient documentation

## 2012-05-25 DIAGNOSIS — I10 Essential (primary) hypertension: Secondary | ICD-10-CM | POA: Insufficient documentation

## 2012-05-25 DIAGNOSIS — Z01812 Encounter for preprocedural laboratory examination: Secondary | ICD-10-CM | POA: Insufficient documentation

## 2012-05-25 DIAGNOSIS — I2510 Atherosclerotic heart disease of native coronary artery without angina pectoris: Secondary | ICD-10-CM | POA: Insufficient documentation

## 2012-05-25 DIAGNOSIS — E11628 Type 2 diabetes mellitus with other skin complications: Secondary | ICD-10-CM

## 2012-05-25 DIAGNOSIS — E7800 Pure hypercholesterolemia, unspecified: Secondary | ICD-10-CM | POA: Insufficient documentation

## 2012-05-25 HISTORY — PX: I & D EXTREMITY: SHX5045

## 2012-05-25 LAB — GLUCOSE, CAPILLARY: Glucose-Capillary: 150 mg/dL — ABNORMAL HIGH (ref 70–99)

## 2012-05-25 LAB — POCT HEMOGLOBIN-HEMACUE: Hemoglobin: 13.3 g/dL (ref 13.0–17.0)

## 2012-05-25 SURGERY — IRRIGATION AND DEBRIDEMENT EXTREMITY
Anesthesia: Regional | Site: Foot | Laterality: Left | Wound class: Contaminated

## 2012-05-25 MED ORDER — LIDOCAINE-EPINEPHRINE 1.5-1:200000 % IJ SOLN
INTRAMUSCULAR | Status: DC | PRN
  Administered 2012-05-25: 25 mL via INTRADERMAL

## 2012-05-25 MED ORDER — ROPIVACAINE HCL 5 MG/ML IJ SOLN
INTRAMUSCULAR | Status: DC | PRN
  Administered 2012-05-25: 25 mL via EPIDURAL

## 2012-05-25 MED ORDER — CEFAZOLIN SODIUM-DEXTROSE 2-3 GM-% IV SOLR
2.0000 g | INTRAVENOUS | Status: AC
  Administered 2012-05-25: 2 g via INTRAVENOUS

## 2012-05-25 MED ORDER — LIDOCAINE HCL 1 % IJ SOLN
INTRAMUSCULAR | Status: DC | PRN
  Administered 2012-05-25: 2 mL via INTRADERMAL

## 2012-05-25 MED ORDER — FENTANYL CITRATE 0.05 MG/ML IJ SOLN
50.0000 ug | INTRAMUSCULAR | Status: DC | PRN
  Administered 2012-05-25: 50 ug via INTRAVENOUS

## 2012-05-25 MED ORDER — FENTANYL CITRATE 0.05 MG/ML IJ SOLN: 25.0000 ug | INTRAMUSCULAR | Status: DC | PRN

## 2012-05-25 MED ORDER — OXYCODONE HCL 5 MG PO TABS: 5.0000 mg | ORAL_TABLET | Freq: Once | ORAL | Status: DC | PRN

## 2012-05-25 MED ORDER — PROPOFOL 10 MG/ML IV EMUL
INTRAVENOUS | Status: DC | PRN
  Administered 2012-05-25: 50 ug/kg/min via INTRAVENOUS

## 2012-05-25 MED ORDER — SODIUM CHLORIDE 0.9 % IV SOLN: INTRAVENOUS | Status: DC

## 2012-05-25 MED ORDER — LIDOCAINE HCL (CARDIAC) 20 MG/ML IV SOLN
INTRAVENOUS | Status: DC | PRN
  Administered 2012-05-25: 40 mg via INTRAVENOUS

## 2012-05-25 MED ORDER — METOCLOPRAMIDE HCL 5 MG/ML IJ SOLN: 10.0000 mg | Freq: Once | INTRAMUSCULAR | Status: DC | PRN

## 2012-05-25 MED ORDER — LACTATED RINGERS IV SOLN
INTRAVENOUS | Status: DC
  Administered 2012-05-25: 10:00:00 via INTRAVENOUS

## 2012-05-25 MED ORDER — MIDAZOLAM HCL 2 MG/2ML IJ SOLN
0.5000 mg | INTRAMUSCULAR | Status: DC | PRN
  Administered 2012-05-25: 1 mg via INTRAVENOUS

## 2012-05-25 SURGICAL SUPPLY — 64 items
BAG DECANTER FOR FLEXI CONT (MISCELLANEOUS) ×1 IMPLANT
BANDAGE ELASTIC 4 VELCRO ST LF (GAUZE/BANDAGES/DRESSINGS) ×1 IMPLANT
BANDAGE ESMARK 6X9 LF (GAUZE/BANDAGES/DRESSINGS) IMPLANT
BANDAGE GAUZE ELAST BULKY 4 IN (GAUZE/BANDAGES/DRESSINGS) IMPLANT
BLADE SURG 15 STRL LF DISP TIS (BLADE) ×2 IMPLANT
BLADE SURG 15 STRL SS (BLADE) ×4
BNDG CMPR 9X4 STRL LF SNTH (GAUZE/BANDAGES/DRESSINGS)
BNDG CMPR 9X6 STRL LF SNTH (GAUZE/BANDAGES/DRESSINGS)
BNDG COHESIVE 3X5 TAN STRL LF (GAUZE/BANDAGES/DRESSINGS) IMPLANT
BNDG COHESIVE 4X5 TAN STRL (GAUZE/BANDAGES/DRESSINGS) IMPLANT
BNDG COHESIVE 6X5 TAN STRL LF (GAUZE/BANDAGES/DRESSINGS) IMPLANT
BNDG ESMARK 4X9 LF (GAUZE/BANDAGES/DRESSINGS) ×1 IMPLANT
BNDG ESMARK 6X9 LF (GAUZE/BANDAGES/DRESSINGS)
CHLORAPREP W/TINT 26ML (MISCELLANEOUS) ×1 IMPLANT
CLOTH BEACON ORANGE TIMEOUT ST (SAFETY) ×2 IMPLANT
COVER TABLE BACK 60X90 (DRAPES) ×2 IMPLANT
CUFF TOURNIQUET SINGLE 18IN (TOURNIQUET CUFF) IMPLANT
CUFF TOURNIQUET SINGLE 34IN LL (TOURNIQUET CUFF) IMPLANT
DECANTER SPIKE VIAL GLASS SM (MISCELLANEOUS) IMPLANT
DRAPE EXTREMITY T 121X128X90 (DRAPE) ×2 IMPLANT
DRAPE SURG 17X23 STRL (DRAPES) ×2 IMPLANT
DRAPE U-SHAPE 47X51 STRL (DRAPES) ×1 IMPLANT
DRSG ADAPTIC 3X8 NADH LF (GAUZE/BANDAGES/DRESSINGS) ×1 IMPLANT
DRSG EMULSION OIL 3X3 NADH (GAUZE/BANDAGES/DRESSINGS) ×1 IMPLANT
DRSG PAD ABDOMINAL 8X10 ST (GAUZE/BANDAGES/DRESSINGS) IMPLANT
GLOVE BIO SURGEON STRL SZ8 (GLOVE) ×2 IMPLANT
GLOVE BIOGEL PI IND STRL 8 (GLOVE) ×1 IMPLANT
GLOVE BIOGEL PI INDICATOR 8 (GLOVE) ×1
GOWN PREVENTION PLUS XLARGE (GOWN DISPOSABLE) ×2 IMPLANT
GOWN PREVENTION PLUS XXLARGE (GOWN DISPOSABLE) ×2 IMPLANT
HANDPIECE INTERPULSE COAX TIP (DISPOSABLE)
MATRIX SURGICAL PSM 10X15CM (Tissue) ×1 IMPLANT
MICROMATRIX 500MG (Tissue) ×2 IMPLANT
NDL SAFETY ECLIPSE 18X1.5 (NEEDLE) IMPLANT
NEEDLE HYPO 18GX1.5 SHARP (NEEDLE)
NEEDLE HYPO 22GX1.5 SAFETY (NEEDLE) ×1 IMPLANT
PACK BASIN DAY SURGERY FS (CUSTOM PROCEDURE TRAY) ×2 IMPLANT
PAD CAST 4YDX4 CTTN HI CHSV (CAST SUPPLIES) ×2 IMPLANT
PADDING CAST ABS 4INX4YD NS (CAST SUPPLIES) ×1
PADDING CAST ABS COTTON 4X4 ST (CAST SUPPLIES) IMPLANT
PADDING CAST COTTON 4X4 STRL (CAST SUPPLIES) ×4
PENCIL BUTTON HOLSTER BLD 10FT (ELECTRODE) ×1 IMPLANT
SET HNDPC FAN SPRY TIP SCT (DISPOSABLE) IMPLANT
SHEET MEDIUM DRAPE 40X70 STRL (DRAPES) ×2 IMPLANT
SOLUTION PARTIC MCRMTRX 500MG (Tissue) IMPLANT
SPLINT FAST PLASTER 5X30 (CAST SUPPLIES)
SPLINT PLASTER CAST FAST 5X30 (CAST SUPPLIES) IMPLANT
SPONGE LAP 18X18 X RAY DECT (DISPOSABLE) ×2 IMPLANT
STAPLER VISISTAT 35W (STAPLE) ×1 IMPLANT
STOCKINETTE 6  STRL (DRAPES) ×1
STOCKINETTE 6 STRL (DRAPES) ×1 IMPLANT
SUCTION FRAZIER TIP 10 FR DISP (SUCTIONS) ×1 IMPLANT
SUT PROLENE 3 0 PS 2 (SUTURE) IMPLANT
SUT VIC AB 2-0 SH 27 (SUTURE)
SUT VIC AB 2-0 SH 27XBRD (SUTURE) IMPLANT
SUT VICRYL 4-0 PS2 18IN ABS (SUTURE) IMPLANT
SUT VICRYL RAPID 5 0 P 3 (SUTURE) ×2 IMPLANT
SYR 20CC LL (SYRINGE) ×1 IMPLANT
SYR 5ML LL (SYRINGE) IMPLANT
SYR BULB 3OZ (MISCELLANEOUS) ×1 IMPLANT
TUBE CONNECTING 20X1/4 (TUBING) ×2 IMPLANT
UNDERPAD 30X30 INCONTINENT (UNDERPADS AND DIAPERS) ×2 IMPLANT
WATER STERILE IRR 1000ML POUR (IV SOLUTION) ×1 IMPLANT
YANKAUER SUCT BULB TIP NO VENT (SUCTIONS) ×1 IMPLANT

## 2012-05-25 NOTE — Progress Notes (Signed)
Patient in recliner chair in Phase II recovery.  Voices concern over wound VAC setting.  Patient does not think the [facility] is working as it was previously.  Spoke to Dr Victorino Dike who states it should be set at 150. Screen says 175-200.  Spoke to W[facility] Center RN Moises Blood who will evaluate wound VAC after [facility] is [facility] from [facility] today.

## 2012-05-25 NOTE — Anesthesia Preprocedure Evaluation (Addendum)
Anesthesia Evaluation  Patient identified by MRN, date of birth, ID band Patient awake    Reviewed: Allergy & Precautions, H&P , NPO status , Patient's Chart, lab work & pertinent test results, reviewed documented beta blocker date and time   Airway Mallampati: II TM Distance: >3 FB Neck ROM: full    Dental   Pulmonary asthma ,          Cardiovascular hypertension, On Medications and On Home Beta Blockers + CAD, + Peripheral Vascular Disease and +CHF     Neuro/Psych CVA negative neurological ROS  negative psych ROS   GI/Hepatic negative GI ROS, Neg liver ROS,   Endo/Other  Diabetes mellitus-, Insulin Dependent and Oral Hypoglycemic Agents  Renal/GU negative Renal ROS  negative genitourinary   Musculoskeletal   Abdominal   Peds  Hematology negative hematology ROS (+)   Anesthesia Other Findings See surgeon's H&P   Reproductive/Obstetrics negative OB ROS                           Anesthesia Physical Anesthesia Plan  ASA: III  Anesthesia Plan: MAC and Regional   Post-op Pain Management:    Induction:   Airway Management Planned: Simple Face Mask  Additional Equipment:   Intra-op Plan:   Post-operative Plan: Extubation in OR  Informed Consent: I have reviewed the patients History and Physical, chart, labs and discussed the procedure including the risks, benefits and alternatives for the proposed anesthesia with the patient or authorized representative who has indicated his/her understanding and acceptance.   Dental Advisory Given  Plan Discussed with: CRNA and Surgeon  Anesthesia Plan Comments:         Anesthesia Quick Evaluation

## 2012-05-25 NOTE — Anesthesia Procedure Notes (Addendum)
Anesthesia Regional Block:  Popliteal block  Pre-Anesthetic Checklist: ,, timeout performed, Correct Patient, Correct Site, Correct Laterality, Correct Procedure, Correct Position, site marked, Risks and benefits discussed,  Surgical consent,  Pre-op evaluation,  At surgeon's request and post-op pain management  Laterality: Left  Prep: chloraprep       Needles:   Needle Type: Other   (Arrow Echogenic)   Needle Length: 9cm  Needle Gauge: 21    Additional Needles:  Procedures: ultrasound guided Popliteal block Narrative:  Start time: 05/25/2012 10:31 AM End time: 05/25/2012 10:40 AM Injection made incrementally with aspirations every 5 mL.  Performed by: Personally  Anesthesiologist: Aldona Lento, MD  Additional Notes: Ultrasound guidance used to: id relevant anatomy, confirm needle position, local anesthetic spread, avoidance of vascular puncture. Picture saved. No complications. Block performed personally by Janetta Hora. Gelene Mink, MD  .    Popliteal block Procedure Name: MAC Performed by: Meyer Russel Pre-anesthesia Checklist: Patient identified, Timeout performed, Emergency Drugs available, Suction available and Patient being monitored Oxygen Delivery Method: Simple face mask

## 2012-05-25 NOTE — Progress Notes (Signed)
Assisted Dr. Frederick with left, ultrasound guided, popliteal/saphenous block. Side rails up, monitors on throughout procedure. See vital signs in flow sheet. Tolerated Procedure well. 

## 2012-05-25 NOTE — Anesthesia Postprocedure Evaluation (Signed)
Anesthesia Post Note  Patient: Anthony Holder  Procedure(s) Performed: Procedure(s) (LRB): IRRIGATION AND DEBRIDEMENT EXTREMITY (Left)  Anesthesia type: MAC  Patient location: PACU  Post pain: Pain level controlled  Post assessment: Patient's Cardiovascular Status Stable  Last Vitals:  Filed Vitals:   05/25/12 1300  BP: 152/80  Pulse: 76  Temp:   Resp: 15    Post vital signs: Reviewed and stable  Level of consciousness: alert  Complications: No apparent anesthesia complications
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
## 2012-05-25 NOTE — Transfer of Care (Signed)
Immediate Anesthesia Transfer of Care Note  Patient: Anthony Holder  Procedure(s) Performed: Procedure(s) (LRB): IRRIGATION AND DEBRIDEMENT EXTREMITY (Left)  Patient Location: PACU  Anesthesia Type: Regional  Level of Consciousness: awake, alert  and oriented  Airway & Oxygen Therapy: Patient Spontanous Breathing  Post-op Assessment: Report given to PACU RN, Post -op Vital signs reviewed and stable and Patient moving all extremities  Post vital signs: Reviewed and stable  Complications: No apparent anesthesia complications

## 2012-05-25 NOTE — Discharge Instructions (Addendum)
Toni Arthurs, MD [facility]  Please read the following information regarding your care after surgery.  Medications  You only need a prescription for the narcotic pain medicine (ex. oxycodone, Percocet, Norco).  All of the other medicines listed below are available over the counter. ? acetominophen (Tylenol) 650 mg every 4-6 hours as you need for minor pain ? oxycodone as prescribed for moderate to severe pain Tramadol as needed for pain   Narcotic pain medicine (ex. oxycodone, Percocet, Vicodin) will cause constipation.  To prevent this problem, take the following medicines while you are taking any pain medicine. ? docusate sodium (Colace) 100 mg twice a day ? senna (Senokot) 2 tablets twice a day  ? To help prevent blood clots, take an aspirin (325 mg) once a day for a month after surgery.  You should also get up every hour while you are awake to move around.    Weight Bearing ? Bear weight when you are able on your operated leg or foot. X Bear weight only on the heel of your operated foot in the post-op shoe. ? Do not bear any weight on the operated leg or foot.  Cast / Splint / Dressing ? Keep your splint or cast clean and dry.  Don't put anything (coat hanger, pencil, etc) down inside of it.  If it gets damp, use a hair dryer on the cool setting to dry it.  If it gets soaked, call the office to schedule an appointment for a cast change. ? Remove your dressing 3 days after surgery and cover the incisions with dry dressings.   X Leave the wound VAC dressing in place.  After your dressing, cast or splint is removed; you may shower, but do not soak or scrub the wound.  Allow the water to run over it, and then gently pat it dry.  Swelling It is normal for you to have swelling where you had surgery.  To reduce swelling and pain, keep your toes above your nose for at least 3 days after surgery.  It may be necessary to keep your foot or leg elevated for several weeks.  If it  hurts, it should be elevated.  Follow Up Call my office at [phone] when you are discharged from the hospital or surgery center to schedule an appointment to be seen two weeks after surgery.  Call my office at [phone] if you develop a fever >101.5 F, nausea, vomiting, bleeding from the surgical site or severe pain.    Follow up with the wound care center tomorrow as scheduled.    Post Anesthesia Home Care Instructions  Activity: Get plenty of rest for the remainder of the day. A responsible adult should stay with you for 24 hours following the procedure.  For the next 24 hours, DO NOT: -Drive a car -Advertising copywriter -Drink alcoholic beverages -Take any medication unless instructed by your physician -Make any legal decisions or sign important papers.  Meals: Start with liquid foods such as gelatin or soup. Progress to regular foods as tolerated. Avoid greasy, spicy, heavy foods. If nausea and/or vomiting occur, drink only clear liquids until the nausea and/or vomiting subsides. Call your physician if vomiting continues.  Special Instructions/Symptoms: Your throat may feel dry or sore from the anesthesia or the breathing tube placed in your throat during surgery. If this causes discomfort, gargle with warm salt water. The discomfort should disappear within 24 hours.    Regional Anesthesia Blocks  1. Numbness or the inability to move  the "blocked" extremity may last from 3-48 hours after placement. The length of time depends on the medication injected and your individual response to the medication. If the numbness is not going away after 48 hours, call your surgeon.  2. The extremity that is blocked will need to be protected until the numbness is gone and the  Strength has returned. Because you cannot feel it, you will need to take extra care to avoid injury. Because it may be weak, you may have difficulty moving it or using it. You may not know what position it is in without  looking at it while the block is in effect.  3. For blocks in the legs and feet, returning to weight bearing and walking needs to be done carefully. You will need to wait until the numbness is entirely gone and the strength has returned. You should be able to move your leg and foot normally before you try and bear weight or walk. You will need someone to be with you when you first try to ensure you do not fall and possibly risk injury.  4. Bruising and tenderness at the needle site are common side effects and will resolve in a few days.  5. Persistent numbness or new problems with movement should be communicated to the surgeon or the [facility] [phone] [facility] [phone]).

## 2012-05-25 NOTE — Brief Op Note (Signed)
05/25/2012  12:42 PM  PATIENT:  Anthony Holder  71 y.o. male  PRE-OPERATIVE DIAGNOSIS:  left foot non-healing plantar wound  POST-OPERATIVE DIAGNOSIS:  Same  Procedure(s): 1.  Irrigation and excisional debridement of left foot wound 3 cm wide x 11 cm long x 3 cm deep 2.  Application of Acel skin acellular dermal matrix to wound 3.  Application of wound VAC dressing 3 cm x 11 cm x 3 cm  SURGEON:  Toni Arthurs, MD  ASSISTANT: n/a  ANESTHESIA:   MAC, regional  EBL:  minimal   TOURNIQUET:  n/a  COMPLICATIONS:  None apparent  DISPOSITION:  Extubated, awake and stable to recovery.  DICTATION ID:   [id number]

## 2012-05-25 NOTE — Op Note (Signed)
NAMELESLEY, GALENTINE NO.:  [id number]  MEDICAL RECORD NO.:  [id number]  LOCATION:  FOOT                         FACILITY:  [facility]  PHYSICIAN:  Toni Arthurs, MD        DATE OF BIRTH:  04-12-1941  DATE OF PROCEDURE:  05/25/2012 DATE OF DISCHARGE:  05/25/2012                              OPERATIVE REPORT   PREOPERATIVE DIAGNOSIS:  Left foot nonhealing plantar wound.  POSTOPERATIVE DIAGNOSIS:  Left foot nonhealing plantar wound.  PROCEDURE: 1. Irrigation and excisional debridement of left foot wound 3 cm wide     x 11 cm long x 3 cm deep including skin, subcutaneous tissue,     muscle and bone. 2. Application of acellular dermal matrix to the wound. 3. Application of wound VAC dressing 3 cm x 11 cm x 3 cm.  SURGEON:  Toni Arthurs, MD  ANESTHESIA:  MAC, regional.  ESTIMATED BLOOD LOSS:  Minimal.  TOURNIQUET TIME:  0.  COMPLICATIONS:  None apparent.  DISPOSITION:  Extubated awake and stable to recovery.  INDICATIONS FOR PROCEDURE:  The patient is a 71 year old male with past medical history significant for diabetes who is now about 8 weeks, status post left second toe amputation for gangrene.  This was complicated by further proximal spread of the gangrenous changes with abscess formation in the plantar aspect of the arch.  He underwent 3 I and D procedures as an inpatient with final application of ACL and a wound VAC about 5 weeks ago.  He has been on IV antibiotics.  He has been [facility] up with [facility]  He presents now for repeat I and D and another application of acellular dermal matrix. He has had delayed healing of the distal portion of this wound and requires an increase in the level of care in the postoperative period for his initial debridement surgeries.  He understands risks and benefits of the alternative treatment options and elects surgical treatment.  He specifically understands risks of bleeding, infection, nerve  damage, blood clots, need for additional surgery, amputation and death.  PROCEDURE IN DETAIL:  After preoperative consent was obtained and the correct operative site was identified, the patient was brought to the operating room and placed supine on the operating table.  Regional anesthesia had previously been administered.  A surgical time-out was taken.  Preoperative antibiotics were administered.  IV sedation was administered.  The left lower extremity was prepped and draped in standard sterile fashion after removing the wound VAC dressing.  The wound was then excisionally debrided with a scalpel and curette sharply of all fibrinous exudate and necrotic appearing material.  This included the level of the skin, subcutaneous tissue, muscle and bone.  The wound was then irrigated copiously.  Again, excision of debridement was carried out removing the remainder of any fibrous material or necrotic appearing material.  Again, this was done with a scalpel.  At this point, ACL powder was applied to the wound after drying it carefully.  A sheet of ACL was then cut to fit the wound and it was stitched in place with simple sutures of 5-0 Vicryl and Adaptic was cut to cover the wound  and was stapled in place.  The Adaptic was covered with Surg-I-Loop. The wound VAC dressing was cut to fit the wound which measured 3 cm x 11 cm x 3 cm.  It was covered with the occlusive dressings and 150 mmHg vacuum was applied.  This achieved an adequate seal.  The foot was then wrapped in cast padding and an Ace bandage.  The patient was awake from anesthesia and transported to the recovery room in stable condition.  FOLLOWUP PLAN:  The patient will be weightbearing as tolerated on his heel and a Darco shoe.  He will follow up with the [facility] tomorrow for hyperbaric oxygen and with Dr. Kelly Splinter on Monday.     Toni Arthurs, MD     JH/MEDQ  D:  05/25/2012  T:  05/25/2012  Job:  [id number]  cc:   Wayland Denis, DO

## 2012-05-26 ENCOUNTER — Encounter (HOSPITAL_BASED_OUTPATIENT_CLINIC_OR_DEPARTMENT_OTHER): Payer: Self-pay | Admitting: Orthopedic Surgery

## 2012-05-29 LAB — GLUCOSE, CAPILLARY: Glucose-Capillary: 278 mg/dL — ABNORMAL HIGH (ref 70–99)

## 2012-05-29 NOTE — Progress Notes (Signed)
[facility]  NAME:  Anthony Holder, Anthony Holder NO.:  MEDICAL RECORD NO.:  [id number]      DATE OF BIRTH:  10/21/1941  PHYSICIAN:  Wayland Denis, DO       VISIT DATE:  05/29/2012                                  OFFICE VISIT   The patient is a 71 year old male, who is here for followup on his left lower extremity ulcer.  He is undergoing hyperbaric treatment with VAC therapy.  He had A-Cell placed in the OR after debridement by Dr. Victorino Dike this past week.  The area is actually looking a little bit better.  The A-Cell is partially incorporated.  He has anterior foot lateral great toe medial 5th toe ulcer, no worse than previously.  Peri-wound area looks a little bit better.  He is still taking Augmentin and Bactrim. There is no other change in his medication or social history.  EXAM:  GENERAL: He is alert, oriented, cooperative, not in any acute distress.  He is pleasant. EYES: Pupils equal.  Extraocular muscles are intact. NECK: No cervical lymphadenopathy. RESPIRATORY: Breathing unlabored. HEART: Regular. ABDOMEN: Soft. EXTREMITIES: Pulses are strong in the upper extremity, a little bit of swelling in the left lower extremity but good capillary refill.  Recommend continuing with hyperbaric oxygen, the VAC, we will leave the Adaptic in place and likely take it off next week and change it.  No change for now.     Wayland Denis, DO     CS/MEDQ  D:  05/29/2012  T:  05/29/2012  Job:  [id number]

## 2012-05-30 LAB — GLUCOSE, CAPILLARY: Glucose-Capillary: 189 mg/dL — ABNORMAL HIGH (ref 70–99)

## 2012-05-31 ENCOUNTER — Encounter (HOSPITAL_BASED_OUTPATIENT_CLINIC_OR_DEPARTMENT_OTHER): Payer: Medicare Other

## 2012-06-01 LAB — GLUCOSE, CAPILLARY
Glucose-Capillary: 271 mg/dL — ABNORMAL HIGH (ref 70–99)
Glucose-Capillary: 224 mg/dL — ABNORMAL HIGH (ref 70–99)

## 2012-06-02 LAB — GLUCOSE, CAPILLARY: Glucose-Capillary: 184 mg/dL — ABNORMAL HIGH (ref 70–99)

## 2012-06-05 LAB — GLUCOSE, CAPILLARY: Glucose-Capillary: 268 mg/dL — ABNORMAL HIGH (ref 70–99)

## 2012-06-05 NOTE — Progress Notes (Signed)
[facility]  NAME:  Anthony Holder, Anthony Holder NO.:  MEDICAL RECORD NO.:  [id number]      DATE OF BIRTH:  March 29, 1941  PHYSICIAN:  Wayland Denis, DO       VISIT DATE:  06/05/2012                                  OFFICE VISIT   The patient is a 71 year old male, who is here for followup on his left lower extremity plantar ulcer.  He has been using the [facility] and had ACell placed in the OR by Dr. Victorino Dike.  The area looks a little bit better with more granulation.  The tendon is still exposed without any granulation over it, but there is no bone exposed.  It is covered by the tendon that at this time.  The peri-wound area looked a little bit improved.  There is malodor or increase in swelling or drainage.  His medications are unchanged and social history is unchanged.  On exam, he is alert, oriented, cooperative, not in any acute distress. He is pleasant.  Pupils are equal.  Extraocular muscles are intact.  No cervical lymphadenopathy.  His breathing is unlabored.  His heart is regular. His abdomen is soft.  His upper extremity pulses are strong and regular.  Lower extremity is as described.  We recommend collagen and the VAC, and taken back to the OR for more ACell placement.     Wayland Denis, DO     CS/MEDQ  D:  06/05/2012  T:  06/05/2012  Job:  [id number]

## 2012-06-06 LAB — GLUCOSE, CAPILLARY: Glucose-Capillary: 203 mg/dL — ABNORMAL HIGH (ref 70–99)

## 2012-06-06 NOTE — Progress Notes (Signed)
[facility]  NAME:  Anthony Holder, Anthony Holder NO.:  MEDICAL RECORD NO.:  [id number]      DATE OF BIRTH:  1941-03-09  PHYSICIAN:  Wayland Denis, DO       VISIT DATE:  06/05/2012                                  OFFICE VISIT   The patient is a 71 year old gentleman, who is here for followup on his left lower extremity ulcer.  I had dictated earlier that there was some improvement, but there is still tendon exposed at the base of the great toe.  Due to this exposure, we would like to place more A-Cell on here and continue with the [facility] therapy so that there is no breakdown in the tendon.  Due to the gravity of the condition with his diabetes and all attempts being made to salvage his limb, he would benefit from another 30 treatments of the hyperbaric oxygen in coordination with surgical intervention.     Wayland Denis, DO     CS/MEDQ  D:  06/05/2012  T:  06/05/2012  Job:  [id number]

## 2012-06-07 ENCOUNTER — Encounter (HOSPITAL_BASED_OUTPATIENT_CLINIC_OR_DEPARTMENT_OTHER): Payer: Self-pay | Admitting: *Deleted

## 2012-06-07 LAB — GLUCOSE, CAPILLARY
Glucose-Capillary: 231 mg/dL — ABNORMAL HIGH (ref 70–99)
Glucose-Capillary: 175 mg/dL — ABNORMAL HIGH (ref 70–99)

## 2012-06-07 NOTE — Progress Notes (Signed)
Pt here 05/25/12 Bring all meds Needs istat Bring all supplies

## 2012-06-08 ENCOUNTER — Ambulatory Visit (HOSPITAL_BASED_OUTPATIENT_CLINIC_OR_DEPARTMENT_OTHER): Payer: Medicare Other | Admitting: Anesthesiology

## 2012-06-08 ENCOUNTER — Encounter (HOSPITAL_BASED_OUTPATIENT_CLINIC_OR_DEPARTMENT_OTHER): Payer: Self-pay | Admitting: *Deleted

## 2012-06-08 ENCOUNTER — Encounter (HOSPITAL_BASED_OUTPATIENT_CLINIC_OR_DEPARTMENT_OTHER)
Admission: RE | Disposition: A | Discharge status: Home / Self Care | Payer: Self-pay | Source: Ambulatory Visit | Attending: Orthopedic Surgery

## 2012-06-08 ENCOUNTER — Ambulatory Visit (HOSPITAL_BASED_OUTPATIENT_CLINIC_OR_DEPARTMENT_OTHER)
Admission: RE | Admit: 2012-06-08 | Discharge: 2012-06-08 | Disposition: A | Discharge status: Home / Self Care | Payer: Medicare Other | Source: Ambulatory Visit | Attending: Orthopedic Surgery | Admitting: Orthopedic Surgery

## 2012-06-08 ENCOUNTER — Encounter (HOSPITAL_BASED_OUTPATIENT_CLINIC_OR_DEPARTMENT_OTHER): Payer: Self-pay | Admitting: Anesthesiology

## 2012-06-08 DIAGNOSIS — Z8673 Personal history of transient ischemic attack (TIA), and cerebral infarction without residual deficits: Secondary | ICD-10-CM | POA: Insufficient documentation

## 2012-06-08 DIAGNOSIS — I10 Essential (primary) hypertension: Secondary | ICD-10-CM | POA: Insufficient documentation

## 2012-06-08 DIAGNOSIS — J45909 Unspecified asthma, uncomplicated: Secondary | ICD-10-CM | POA: Insufficient documentation

## 2012-06-08 DIAGNOSIS — Y835 Amputation of limb(s) as the cause of abnormal reaction of the patient, or of later complication, without mention of misadventure at the time of the procedure: Secondary | ICD-10-CM | POA: Insufficient documentation

## 2012-06-08 DIAGNOSIS — T8789 Other complications of amputation stump: Secondary | ICD-10-CM | POA: Insufficient documentation

## 2012-06-08 DIAGNOSIS — I2510 Atherosclerotic heart disease of native coronary artery without angina pectoris: Secondary | ICD-10-CM | POA: Insufficient documentation

## 2012-06-08 DIAGNOSIS — Z951 Presence of aortocoronary bypass graft: Secondary | ICD-10-CM | POA: Insufficient documentation

## 2012-06-08 DIAGNOSIS — I359 Nonrheumatic aortic valve disorder, unspecified: Secondary | ICD-10-CM | POA: Insufficient documentation

## 2012-06-08 DIAGNOSIS — I509 Heart failure, unspecified: Secondary | ICD-10-CM | POA: Insufficient documentation

## 2012-06-08 DIAGNOSIS — I96 Gangrene, not elsewhere classified: Secondary | ICD-10-CM

## 2012-06-08 DIAGNOSIS — I798 Other disorders of arteries, arterioles and capillaries in diseases classified elsewhere: Secondary | ICD-10-CM | POA: Insufficient documentation

## 2012-06-08 DIAGNOSIS — M129 Arthropathy, unspecified: Secondary | ICD-10-CM | POA: Insufficient documentation

## 2012-06-08 DIAGNOSIS — E1159 Type 2 diabetes mellitus with other circulatory complications: Secondary | ICD-10-CM | POA: Insufficient documentation

## 2012-06-08 DIAGNOSIS — L089 Local infection of the skin and subcutaneous tissue, unspecified: Secondary | ICD-10-CM

## 2012-06-08 HISTORY — PX: INCISION AND DRAINAGE OF WOUND: SHX1803

## 2012-06-08 LAB — POCT I-STAT, CHEM 8
HCT: 40 % (ref 39.0–52.0)
Hemoglobin: 13.6 g/dL (ref 13.0–17.0)
Potassium: 4.6 mEq/L (ref 3.5–5.1)
Sodium: 137 mEq/L (ref 135–145)

## 2012-06-08 SURGERY — IRRIGATION AND DEBRIDEMENT WOUND
Anesthesia: Monitor Anesthesia Care | Site: Foot | Laterality: Left | Wound class: Contaminated

## 2012-06-08 MED ORDER — PROPOFOL 10 MG/ML IV EMUL
INTRAVENOUS | Status: DC | PRN
  Administered 2012-06-08: 75 ug/kg/min via INTRAVENOUS

## 2012-06-08 MED ORDER — MIDAZOLAM HCL 2 MG/2ML IJ SOLN: 1.0000 mg | INTRAMUSCULAR | Status: DC | PRN

## 2012-06-08 MED ORDER — 0.9 % SODIUM CHLORIDE (POUR BTL) OPTIME
TOPICAL | Status: DC | PRN
  Administered 2012-06-08: 500 mL

## 2012-06-08 MED ORDER — VANCOMYCIN HCL 1000 MG IV SOLR
1000.0000 mg | INTRAVENOUS | Status: DC | PRN
  Administered 2012-06-08: 1000 mg via INTRAVENOUS

## 2012-06-08 MED ORDER — LIDOCAINE HCL (CARDIAC) 20 MG/ML IV SOLN
INTRAVENOUS | Status: DC | PRN
  Administered 2012-06-08: 50 mg via INTRAVENOUS

## 2012-06-08 MED ORDER — ONDANSETRON HCL 4 MG/2ML IJ SOLN
INTRAMUSCULAR | Status: DC | PRN
  Administered 2012-06-08: 4 mg via INTRAVENOUS

## 2012-06-08 MED ORDER — SODIUM CHLORIDE 0.9 % IV SOLN: INTRAVENOUS | Status: DC

## 2012-06-08 MED ORDER — LACTATED RINGERS IV SOLN
INTRAVENOUS | Status: DC
  Administered 2012-06-08: 11:00:00 via INTRAVENOUS

## 2012-06-08 MED ORDER — BUPIVACAINE-EPINEPHRINE PF 0.5-1:200000 % IJ SOLN
INTRAMUSCULAR | Status: DC | PRN
  Administered 2012-06-08: 30 mL

## 2012-06-08 MED ORDER — CHLORHEXIDINE GLUCONATE 4 % EX LIQD: 60.0000 mL | Freq: Once | CUTANEOUS | Status: DC

## 2012-06-08 MED ORDER — FENTANYL CITRATE 0.05 MG/ML IJ SOLN
50.0000 ug | INTRAMUSCULAR | Status: DC | PRN
Start: 2012-06-08 — End: 2012-06-08
  Administered 2012-06-08: 50 ug via INTRAVENOUS

## 2012-06-08 MED ORDER — FENTANYL CITRATE 0.05 MG/ML IJ SOLN
INTRAMUSCULAR | Status: DC | PRN
  Administered 2012-06-08: 50 ug via INTRAVENOUS

## 2012-06-08 SURGICAL SUPPLY — 74 items
BAG DECANTER FOR FLEXI CONT (MISCELLANEOUS) ×2 IMPLANT
BANDAGE ELASTIC 4 VELCRO ST LF (GAUZE/BANDAGES/DRESSINGS) ×1 IMPLANT
BANDAGE ESMARK 6X9 LF (GAUZE/BANDAGES/DRESSINGS) IMPLANT
BANDAGE GAUZE ELAST BULKY 4 IN (GAUZE/BANDAGES/DRESSINGS) IMPLANT
BLADE SURG 15 STRL LF DISP TIS (BLADE) ×2 IMPLANT
BLADE SURG 15 STRL SS (BLADE) ×4
BNDG CMPR 9X4 STRL LF SNTH (GAUZE/BANDAGES/DRESSINGS)
BNDG CMPR 9X6 STRL LF SNTH (GAUZE/BANDAGES/DRESSINGS)
BNDG COHESIVE 3X5 TAN STRL LF (GAUZE/BANDAGES/DRESSINGS) IMPLANT
BNDG COHESIVE 4X5 TAN STRL (GAUZE/BANDAGES/DRESSINGS) IMPLANT
BNDG COHESIVE 6X5 TAN STRL LF (GAUZE/BANDAGES/DRESSINGS) IMPLANT
BNDG ESMARK 4X9 LF (GAUZE/BANDAGES/DRESSINGS) ×1 IMPLANT
BNDG ESMARK 6X9 LF (GAUZE/BANDAGES/DRESSINGS)
CHLORAPREP W/TINT 26ML (MISCELLANEOUS) ×1 IMPLANT
CLOTH BEACON ORANGE TIMEOUT ST (SAFETY) ×2 IMPLANT
COVER TABLE BACK 60X90 (DRAPES) ×2 IMPLANT
CUFF TOURNIQUET SINGLE 18IN (TOURNIQUET CUFF) IMPLANT
CUFF TOURNIQUET SINGLE 34IN LL (TOURNIQUET CUFF) IMPLANT
DECANTER SPIKE VIAL GLASS SM (MISCELLANEOUS) IMPLANT
DRAPE EXTREMITY T 121X128X90 (DRAPE) ×2 IMPLANT
DRAPE INCISE IOBAN 66X45 STRL (DRAPES) ×1 IMPLANT
DRAPE SURG 17X23 STRL (DRAPES) ×2 IMPLANT
DRAPE U-SHAPE 47X51 STRL (DRAPES) ×1 IMPLANT
DRSG ADAPTIC 3X8 NADH LF (GAUZE/BANDAGES/DRESSINGS) ×1 IMPLANT
DRSG EMULSION OIL 3X3 NADH (GAUZE/BANDAGES/DRESSINGS) IMPLANT
DRSG PAD ABDOMINAL 8X10 ST (GAUZE/BANDAGES/DRESSINGS) ×1 IMPLANT
GLOVE BIO SURGEON STRL SZ7 (GLOVE) ×1 IMPLANT
GLOVE BIO SURGEON STRL SZ8 (GLOVE) ×2 IMPLANT
GLOVE BIOGEL PI IND STRL 8 (GLOVE) ×1 IMPLANT
GLOVE BIOGEL PI INDICATOR 8 (GLOVE) ×1
GLOVE ECLIPSE 6.5 STRL STRAW (GLOVE) ×1 IMPLANT
GLOVE INDICATOR 7.0 STRL GRN (GLOVE) ×1 IMPLANT
GOWN PREVENTION PLUS XLARGE (GOWN DISPOSABLE) ×3 IMPLANT
GOWN PREVENTION PLUS XXLARGE (GOWN DISPOSABLE) ×2 IMPLANT
HANDPIECE INTERPULSE COAX TIP (DISPOSABLE)
MATRIX SURGICAL PSMX 10X15CM (Tissue) ×1 IMPLANT
MICROMATRIX 500MG (Tissue) ×2 IMPLANT
NDL SAFETY ECLIPSE 18X1.5 (NEEDLE) IMPLANT
NEEDLE HYPO 18GX1.5 SHARP (NEEDLE)
NEEDLE HYPO 22GX1.5 SAFETY (NEEDLE) ×1 IMPLANT
PACK BASIN DAY SURGERY FS (CUSTOM PROCEDURE TRAY) ×2 IMPLANT
PAD CAST 4YDX4 CTTN HI CHSV (CAST SUPPLIES) ×2 IMPLANT
PADDING CAST ABS 4INX4YD NS (CAST SUPPLIES)
PADDING CAST ABS COTTON 4X4 ST (CAST SUPPLIES) IMPLANT
PADDING CAST COTTON 4X4 STRL (CAST SUPPLIES) ×2
PENCIL BUTTON HOLSTER BLD 10FT (ELECTRODE) ×2 IMPLANT
SET HNDPC FAN SPRY TIP SCT (DISPOSABLE) IMPLANT
SHEET MEDIUM DRAPE 40X70 STRL (DRAPES) ×2 IMPLANT
SLEEVE SCD COMPRESS KNEE MED (MISCELLANEOUS) ×1 IMPLANT
SOLUTION PARTIC MCRMTRX 500MG (Tissue) IMPLANT
SPLINT FAST PLASTER 5X30 (CAST SUPPLIES)
SPLINT PLASTER CAST FAST 5X30 (CAST SUPPLIES) IMPLANT
SPONGE LAP 18X18 X RAY DECT (DISPOSABLE) ×2 IMPLANT
STAPLER VISISTAT 35W (STAPLE) ×1 IMPLANT
STOCKINETTE 6  STRL (DRAPES) ×1
STOCKINETTE 6 STRL (DRAPES) ×1 IMPLANT
SUCTION FRAZIER TIP 10 FR DISP (SUCTIONS) ×1 IMPLANT
SUT PROLENE 3 0 PS 2 (SUTURE) IMPLANT
SUT VIC AB 2-0 SH 27 (SUTURE)
SUT VIC AB 2-0 SH 27XBRD (SUTURE) IMPLANT
SUT VIC AB 3-0 PS1 18 (SUTURE)
SUT VIC AB 3-0 PS1 18XBRD (SUTURE) IMPLANT
SUT VIC AB 3-0 SH 18 (SUTURE) IMPLANT
SUT VIC AB 4-0 P-3 18XBRD (SUTURE) IMPLANT
SUT VIC AB 4-0 P3 18 (SUTURE) ×4
SUT VICRYL 4-0 PS2 18IN ABS (SUTURE) IMPLANT
SYR 20CC LL (SYRINGE) ×1 IMPLANT
SYR 5ML LL (SYRINGE) IMPLANT
SYR BULB 3OZ (MISCELLANEOUS) ×1 IMPLANT
TRAY DSU PREP LF (CUSTOM PROCEDURE TRAY) ×1 IMPLANT
TUBE CONNECTING 20X1/4 (TUBING) ×2 IMPLANT
UNDERPAD 30X30 INCONTINENT (UNDERPADS AND DIAPERS) ×2 IMPLANT
VAC GRANUFOAM DRESSING SMALL ×1 IMPLANT
WATER STERILE IRR 1000ML POUR (IV SOLUTION) ×1 IMPLANT

## 2012-06-08 NOTE — Op Note (Signed)
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  POSTOPERATIVE DIAGNOSIS:  Left foot diabetic wound, status post 2nd toe amputation with multiple irrigation and debridement surgeries with application of ACell.  PROCEDURE: 1. Irrigation and excisional debridement of left foot wound including     skin, subcutaneous tissue, muscle, and bone over an area of 3 cm     wide x 2 cm deep 2 x 11 cm long. 2. Application of ACell Dermal Matrix over that same wound. 3. Application of wound VAC dressing over the wound.  SURGEON:  Toni Arthurs, MD.  ANESTHESIA:  MAC, regional.  ESTIMATED BLOOD LOSS:  Minimal.  TOURNIQUET TIME:  Zero.  COMPLICATIONS:  None apparent.  DISPOSITION:  Extubated, awake, and stable to recovery.  INDICATIONS FOR PROCEDURE:  The patient is a 71 year old male with past medical history significant for type 2 diabetes, peripheral vascular disease.  He underwent amputation of the 2nd toe for gangrene approximately 2 months ago.  He had failure of that wound to heal and underwent several I and Ds.  He has had several applications of ACell Dermal Matrix and wound VAC dressing changes.  He presents now for another application of ACell and irrigation and debridement of the wound.  He understands the risks and benefits, the alternative treatment options and elects surgical treatment.  He specifically understands risks of bleeding, infection, nerve damage, blood clots, need for additional surgery, revision, amputation, and death.  PROCEDURE IN DETAIL:   After preoperative consent was obtained, the correct operative site was identified.  The patient was brought to the operating room and placed supine on the operating table.  A regional anesthesia had previously been administered and IV sedation was administered.  Left lower extremity was prepped and draped in standard sterile fashion over the tourniquet around the thigh.  Surgical time-out was taken.  Preoperative antibiotics were administered.  The patient's previous wound VAC dressing had been removed.  The wound was inspected circumferentially and debrided of all necrotic and nonviable tissue. This was done with a scalpel and pickups and followed by a curette. This was done from the level of the skin down through the subcutaneous tissues, muscle, and bone over the entire wound, which measured 3 cm wide x 11 cm long x 2 cm deep.  Proximally in the area of the arch, the wound bed was covered with healthy-appearing granulation tissue. Distally under the 1st MTP joint, the wound had necrotic-appearing flexor hallucis brevis tendons.  There was no exposed bone evident.  All of the necrotic-appearing tendon was excised laterally and the wound towards the base of the 3rd toe was more necrotic-appearing skin and subcutaneous tissue.  Again, this was all excised sharply.  The wound  was then irrigated copiously.  Hemostasis was achieved.  A piece of ACell graft was then cut and stitched in place over the wound.  The graft had been fenestrated and the ACell powder had been applied prior to application of the graft material.  A piece of Adaptic was placed over the graft and this was covered with Surgilube.  The wound VAC dressing was cut to fit the wound and was then applied and the occlusive dressing was applied over top, the vacuum was then applied, and the wound was noted to have an appropriate seal.  Webril and Ace bandage were used to cover the dressing.  The patient was then awake  from anesthesia and transported to the recovery room in stable condition.  FOLLOWUP PLAN:  The patient is to follow up with Dr. Kelly Splinter on Monday for a dressing change.  I will speak with her later today about the appearance of the wound and the likely necessity of transmetatarsal amputation.     Toni Arthurs, MD     JH/MEDQ  D:  06/08/2012  T:  06/08/2012  Job:  [id number]  cc:   Wayland Denis, DO

## 2012-06-08 NOTE — Anesthesia Procedure Notes (Signed)
Anesthesia Regional Block:  Popliteal block  Pre-Anesthetic Checklist: ,, timeout performed, Correct Patient, Correct Site, Correct Laterality, Correct Procedure, Correct Position, site marked, Risks and benefits discussed,  Surgical consent,  Pre-op evaluation,  At surgeon's request and post-op pain management  Laterality: Left  Prep: chloraprep       Needles:  Injection technique: Single-shot  Needle Type: Echogenic Stimulator Needle     Needle Length:cm 9 cm Needle Gauge: 21 G    Additional Needles:  Procedures: ultrasound guided and nerve stimulator Popliteal block  Nerve Stimulator or Paresthesia:  Response: plantar flexion of foot, 0.45 mA,   Additional Responses:   Narrative:  Start time: 06/08/2012 10:20 AM End time: 06/08/2012 10:33 AM Injection made incrementally with aspirations every 5 mL.  Performed by: Personally  Anesthesiologist: Dr Chaney Malling  Additional Notes: Functioning IV was confirmed and monitors were applied.  A 90mm 21ga Arrow echogenic stimulator needle was used. Sterile prep and drape,hand hygiene and sterile gloves were used.  Negative aspiration and negative test dose prior to incremental administration of local anesthetic. The patient tolerated the procedure well.  Ultrasound guidance: relevent anatomy identified, needle position confirmed, local anesthetic spread visualized around nerve(s), vascular puncture avoided.  Image printed for medical record.   Popliteal block

## 2012-06-08 NOTE — Brief Op Note (Signed)
06/08/2012  11:44 AM  PATIENT:  Anthony Holder  71 y.o. male  PRE-OPERATIVE DIAGNOSIS:  Left foot diabetic wound s/p 2nd toe amp and multiple I and Ds with application of accel  POST-OPERATIVE DIAGNOSIS:  same  Procedure(s): 1.  IRRIGATION AND excisional DEBRIDEMENT of left foot wound including skin, subcut tissue, muscle and bone 2.  Application of Accel dermal matrix 3.  APPLICATION OF WOUND VAC 3 cm x 2 cm x 11 cm  SURGEON:  Toni Arthurs, MD  ASSISTANT: n/a  ANESTHESIA:   MAC, regional  EBL:  minimal   TOURNIQUET:  * No tourniquets in log *  COMPLICATIONS:  None apparent  DISPOSITION:  Extubated, awake and stable to recovery.  DICTATION ID:   [id number]

## 2012-06-08 NOTE — Progress Notes (Signed)
Assisted Dr. Hodierne with left, ultrasound guided, popliteal block. Side rails up, monitors on throughout procedure. See vital signs in flow sheet. Tolerated Procedure well. 

## 2012-06-08 NOTE — Discharge Instructions (Signed)
Toni Arthurs, MD [facility]  Please read the following information regarding your care after surgery.  Medications  You only need a prescription for the narcotic pain medicine (ex. oxycodone, Percocet, Norco).  All of the other medicines listed below are available over the counter. ? acetominophen (Tylenol) 650 mg every 4-6 hours as you need for minor pain ? oxycodone as prescribed for moderate to severe pain ?   Narcotic pain medicine (ex. oxycodone, Percocet, Vicodin) will cause constipation.  To prevent this problem, take the following medicines while you are taking any pain medicine. X docusate sodium (Colace) 100 mg twice a day X senna (Senokot) 2 tablets twice a day  ? To help prevent blood clots, take an aspirin (325 mg) once a day for a month after surgery.  You should also get up every hour while you are awake to move around.    Weight Bearing ? Bear weight when you are able on your operated leg or foot. X Bear weight only on the heel of your operated foot in the post-op shoe. ? Do not bear any weight on the operated leg or foot.  Cast / Splint / Dressing X Keep your splint or cast clean and dry.  Don't put anything (coat hanger, pencil, etc) down inside of it.  If it gets damp, use a hair dryer on the cool setting to dry it.  If it gets soaked, call the office to schedule an appointment for a cast change. ? Remove your dressing 3 days after surgery and cover the incisions with dry dressings.    After your dressing, cast or splint is removed; you may shower, but do not soak or scrub the wound.  Allow the water to run over it, and then gently pat it dry.  Swelling It is normal for you to have swelling where you had surgery.  To reduce swelling and pain, keep your toes above your nose for at least 3 days after surgery.  It may be necessary to keep your foot or leg elevated for several weeks.  If it hurts, it should be elevated.  Follow Up Call my office at  [phone] when you are discharged from the hospital or surgery center to schedule an appointment to be seen two weeks after surgery.  Call my office at [phone] if you develop a fever >101.5 F, nausea, vomiting, bleeding from the surgical site or severe pain.     Post Anesthesia Home Care Instructions  Activity: Get plenty of rest for the remainder of the day. A responsible adult should stay with you for 24 hours following the procedure.  For the next 24 hours, DO NOT: -Drive a car -Advertising copywriter -Drink alcoholic beverages -Take any medication unless instructed by your physician -Make any legal decisions or sign important papers.  Meals: Start with liquid foods such as gelatin or soup. Progress to regular foods as tolerated. Avoid greasy, spicy, heavy foods. If nausea and/or vomiting occur, drink only clear liquids until the nausea and/or vomiting subsides. Call your physician if vomiting continues.  Special Instructions/Symptoms: Your throat may feel dry or sore from the anesthesia or the breathing tube placed in your throat during surgery. If this causes discomfort, gargle with warm salt water. The discomfort should disappear within 24 hours.    Regional Anesthesia Blocks  1. Numbness or the inability to move the "blocked" extremity may last from 3-48 hours after placement. The length of time depends on the medication injected and your individual response to  the medication. If the numbness is not going away after 48 hours, call your surgeon.  2. The extremity that is blocked will need to be protected until the numbness is gone and the  Strength has returned. Because you cannot feel it, you will need to take extra care to avoid injury. Because it may be weak, you may have difficulty moving it or using it. You may not know what position it is in without looking at it while the block is in effect.  3. For blocks in the legs and feet, returning to weight bearing and walking  needs to be done carefully. You will need to wait until the numbness is entirely gone and the strength has returned. You should be able to move your leg and foot normally before you try and bear weight or walk. You will need someone to be with you when you first try to ensure you do not fall and possibly risk injury.  4. Bruising and tenderness at the needle site are common side effects and will resolve in a few days.  5. Persistent numbness or new problems with movement should be communicated to the surgeon or the [facility] [phone] [facility] [phone]).

## 2012-06-08 NOTE — Transfer of Care (Signed)
Immediate Anesthesia Transfer of Care Note  Patient: Anthony Holder  Procedure(s) Performed: Procedure(s) (LRB): IRRIGATION AND DEBRIDEMENT WOUND (Left) APPLICATION OF WOUND VAC (Left)  Patient Location: PACU  Anesthesia Type: MAC combined with regional for post-op pain  Level of Consciousness: awake and alert   Airway & Oxygen Therapy: Patient Spontanous Breathing and Patient connected to face mask oxygen  Post-op Assessment: Report given to PACU RN and Post -op Vital signs reviewed and stable  Post vital signs: Reviewed and stable  Complications: No apparent anesthesia complications

## 2012-06-08 NOTE — Anesthesia Preprocedure Evaluation (Signed)
Anesthesia Evaluation  Patient identified by MRN, date of birth, ID band Patient awake    Reviewed: Allergy & Precautions, H&P , NPO status , Patient's Chart, lab work & pertinent test results  Airway Mallampati: II  Neck ROM: full    Dental   Pulmonary asthma ,          Cardiovascular hypertension, + CAD, + CABG and +CHF + Valvular Problems/Murmurs AS     Neuro/Psych CVA    GI/Hepatic   Endo/Other  Diabetes mellitus-, Type 2  Renal/GU      Musculoskeletal  (+) Arthritis -,   Abdominal   Peds  Hematology   Anesthesia Other Findings   Reproductive/Obstetrics                           Anesthesia Physical Anesthesia Plan  ASA: III  Anesthesia Plan: MAC and Regional   Post-op Pain Management:    Induction: Intravenous  Airway Management Planned: Simple Face Mask  Additional Equipment:   Intra-op Plan:   Post-operative Plan:   Informed Consent: I have reviewed the patients History and Physical, chart, labs and discussed the procedure including the risks, benefits and alternatives for the proposed anesthesia with the patient or authorized representative who has indicated his/her understanding and acceptance.     Plan Discussed with: CRNA and Surgeon  Anesthesia Plan Comments:         Anesthesia Quick Evaluation
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
## 2012-06-08 NOTE — Anesthesia Postprocedure Evaluation (Signed)
Anesthesia Post Note  Patient: Anthony Holder  Procedure(s) Performed: Procedure(s) (LRB): IRRIGATION AND DEBRIDEMENT WOUND (Left) APPLICATION OF WOUND VAC (Left)  Anesthesia type: MAC and block  Patient location: PACU  Post pain: Pain level controlled and Adequate analgesia  Post assessment: Post-op Vital signs reviewed, Patient's Cardiovascular Status Stable and Respiratory Function Stable  Last Vitals:  Filed Vitals:   06/08/12 1215  BP: 120/80  Pulse: 73  Temp:   Resp: 17    Post vital signs: Reviewed and stable  Level of consciousness: awake, alert  and oriented  Complications: No apparent anesthesia complications

## 2012-06-12 ENCOUNTER — Encounter: Payer: Self-pay | Admitting: Internal Medicine

## 2012-06-12 ENCOUNTER — Other Ambulatory Visit: Payer: Self-pay | Admitting: Plastic Surgery

## 2012-06-12 ENCOUNTER — Ambulatory Visit (INDEPENDENT_AMBULATORY_CARE_PROVIDER_SITE_OTHER): Payer: Medicare Other | Admitting: Internal Medicine

## 2012-06-12 VITALS — BP 108/54 | HR 91 | Temp 97.9°F | Wt 196.0 lb

## 2012-06-12 DIAGNOSIS — L089 Local infection of the skin and subcutaneous tissue, unspecified: Secondary | ICD-10-CM

## 2012-06-12 DIAGNOSIS — E1169 Type 2 diabetes mellitus with other specified complication: Secondary | ICD-10-CM

## 2012-06-12 DIAGNOSIS — E11628 Type 2 diabetes mellitus with other skin complications: Secondary | ICD-10-CM

## 2012-06-12 LAB — C-REACTIVE PROTEIN: CRP: 2.66 mg/dL — ABNORMAL HIGH (ref <0.60)

## 2012-06-12 LAB — POCT I-STAT, CHEM 8
BUN: 21 mg/dL (ref 6–23)
Calcium, Ion: 1.16 mmol/L (ref 1.12–1.32)
Chloride: 105 mEq/L (ref 96–112)
Glucose, Bld: 189 mg/dL — ABNORMAL HIGH (ref 70–99)
HCT: 40 % (ref 39.0–52.0)
Potassium: 7.8 mEq/L (ref 3.5–5.1)

## 2012-06-12 NOTE — Progress Notes (Signed)
Will need [facility] all meds,supplies

## 2012-06-12 NOTE — Progress Notes (Signed)
[facility]  NAME:  JUDSON, TSAN NO.:  [id number]  MEDICAL RECORD NO.:  [id number]      DATE OF BIRTH:  [birth year]  PHYSICIAN:  Wayland Denis, DO       VISIT DATE:  06/12/2012                                  OFFICE VISIT   The patient is a 71 year old gentleman who is here for followup on his left lower extremity diabetic foot ulcer.  He has ACell with a VAC placed in the operating room by Dr. Victorino Dike last week.  At that time, the area looked questionable with not as much healing as we would like to have at this point.  Dr. Victorino Dike was able to debride more nonviable tissue, and place the ACell.  He states that he has been using the VAC at home.  There has been no change in his medications or social history.  PHYSICAL EXAMINATION:  GENERAL:  He is alert, oriented, cooperative, not in any acute distress.  He is pleasant. HEENT:  Pupils are equal.  Extraocular muscles are intact. NECK:  No cervical lymphadenopathy. LUNGS:  Breathing is unlabored. HEART:  Regular. ABDOMEN:  Soft.  The wound is as described above.  We removed the VAC and I did look underneath the Adaptic, the ACell is still in place and has not fully incorporated.  We will continue with Hydrogel on VAC and plan on taking him to the OR this week for placement of more ACell.     Wayland Denis, DO     CS/MEDQ  D:  06/12/2012  T:  06/12/2012  Job:  [id number]
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
## 2012-06-13 LAB — GLUCOSE, CAPILLARY
Glucose-Capillary: 202 mg/dL — ABNORMAL HIGH (ref 70–99)
Glucose-Capillary: 164 mg/dL — ABNORMAL HIGH (ref 70–99)

## 2012-06-13 LAB — SEDIMENTATION RATE: Sed Rate: 79 mm/hr — ABNORMAL HIGH (ref 0–16)

## 2012-06-14 ENCOUNTER — Encounter (HOSPITAL_BASED_OUTPATIENT_CLINIC_OR_DEPARTMENT_OTHER): Payer: Self-pay | Admitting: *Deleted

## 2012-06-15 ENCOUNTER — Encounter (HOSPITAL_BASED_OUTPATIENT_CLINIC_OR_DEPARTMENT_OTHER): Payer: Self-pay | Admitting: *Deleted

## 2012-06-15 ENCOUNTER — Ambulatory Visit (HOSPITAL_BASED_OUTPATIENT_CLINIC_OR_DEPARTMENT_OTHER)
Admission: RE | Admit: 2012-06-15 | Discharge: 2012-06-15 | Disposition: A | Discharge status: Home / Self Care | Payer: Medicare Other | Source: Ambulatory Visit | Attending: Plastic Surgery | Admitting: Plastic Surgery

## 2012-06-15 ENCOUNTER — Encounter (HOSPITAL_BASED_OUTPATIENT_CLINIC_OR_DEPARTMENT_OTHER): Payer: Self-pay | Admitting: Plastic Surgery

## 2012-06-15 ENCOUNTER — Encounter (HOSPITAL_BASED_OUTPATIENT_CLINIC_OR_DEPARTMENT_OTHER): Payer: Self-pay | Admitting: Anesthesiology

## 2012-06-15 ENCOUNTER — Encounter (HOSPITAL_BASED_OUTPATIENT_CLINIC_OR_DEPARTMENT_OTHER): Payer: Self-pay | Admitting: Certified Registered"

## 2012-06-15 ENCOUNTER — Encounter (HOSPITAL_BASED_OUTPATIENT_CLINIC_OR_DEPARTMENT_OTHER)
Admission: RE | Disposition: A | Discharge status: Home / Self Care | Payer: Self-pay | Source: Ambulatory Visit | Attending: Plastic Surgery

## 2012-06-15 ENCOUNTER — Ambulatory Visit (HOSPITAL_BASED_OUTPATIENT_CLINIC_OR_DEPARTMENT_OTHER): Payer: Medicare Other | Admitting: Anesthesiology

## 2012-06-15 DIAGNOSIS — R29898 Other symptoms and signs involving the musculoskeletal system: Secondary | ICD-10-CM | POA: Insufficient documentation

## 2012-06-15 DIAGNOSIS — L97509 Non-pressure chronic ulcer of other part of unspecified foot with unspecified severity: Secondary | ICD-10-CM | POA: Insufficient documentation

## 2012-06-15 DIAGNOSIS — E119 Type 2 diabetes mellitus without complications: Secondary | ICD-10-CM | POA: Insufficient documentation

## 2012-06-15 DIAGNOSIS — I10 Essential (primary) hypertension: Secondary | ICD-10-CM | POA: Insufficient documentation

## 2012-06-15 DIAGNOSIS — J45909 Unspecified asthma, uncomplicated: Secondary | ICD-10-CM | POA: Insufficient documentation

## 2012-06-15 DIAGNOSIS — I2510 Atherosclerotic heart disease of native coronary artery without angina pectoris: Secondary | ICD-10-CM | POA: Insufficient documentation

## 2012-06-15 DIAGNOSIS — I69998 Other sequelae following unspecified cerebrovascular disease: Secondary | ICD-10-CM | POA: Insufficient documentation

## 2012-06-15 DIAGNOSIS — E7800 Pure hypercholesterolemia, unspecified: Secondary | ICD-10-CM | POA: Insufficient documentation

## 2012-06-15 DIAGNOSIS — I509 Heart failure, unspecified: Secondary | ICD-10-CM | POA: Insufficient documentation

## 2012-06-15 HISTORY — PX: INCISION AND DRAINAGE OF WOUND: SHX1803

## 2012-06-15 LAB — POCT I-STAT, CHEM 8
BUN: 12 mg/dL (ref 6–23)
Calcium, Ion: 1.14 mmol/L (ref 1.12–1.32)
Creatinine, Ser: 1.2 mg/dL (ref 0.50–1.35)
Sodium: 136 mEq/L (ref 135–145)
TCO2: 22 mmol/L (ref 0–100)

## 2012-06-15 SURGERY — IRRIGATION AND DEBRIDEMENT WOUND
Anesthesia: General | Site: Foot | Laterality: Left | Wound class: Dirty or Infected

## 2012-06-15 MED ORDER — SODIUM CHLORIDE 0.9 % IR SOLN
Status: DC | PRN
  Administered 2012-06-15: 17:00:00

## 2012-06-15 MED ORDER — FENTANYL CITRATE 0.05 MG/ML IJ SOLN
25.0000 ug | INTRAMUSCULAR | Status: DC | PRN
  Administered 2012-06-15 (×3): 25 ug via INTRAVENOUS

## 2012-06-15 MED ORDER — PROPOFOL 10 MG/ML IV EMUL
INTRAVENOUS | Status: DC | PRN
  Administered 2012-06-15: 150 mg via INTRAVENOUS

## 2012-06-15 MED ORDER — FENTANYL CITRATE 0.05 MG/ML IJ SOLN
INTRAMUSCULAR | Status: DC | PRN
  Administered 2012-06-15: 100 ug via INTRAVENOUS

## 2012-06-15 MED ORDER — ONDANSETRON HCL 4 MG/2ML IJ SOLN
INTRAMUSCULAR | Status: DC | PRN
  Administered 2012-06-15: 4 mg via INTRAVENOUS

## 2012-06-15 MED ORDER — METOCLOPRAMIDE HCL 5 MG/ML IJ SOLN
INTRAMUSCULAR | Status: DC | PRN
  Administered 2012-06-15: 10 mg via INTRAVENOUS

## 2012-06-15 MED ORDER — CEFAZOLIN SODIUM 1-5 GM-% IV SOLN
1.0000 g | INTRAVENOUS | Status: AC
  Administered 2012-06-15: 2 g via INTRAVENOUS

## 2012-06-15 MED ORDER — LIDOCAINE HCL (CARDIAC) 20 MG/ML IV SOLN
INTRAVENOUS | Status: DC | PRN
  Administered 2012-06-15: 60 mg via INTRAVENOUS

## 2012-06-15 MED ORDER — HYDROCODONE-ACETAMINOPHEN 5-325 MG PO TABS: 1.0000 | ORAL_TABLET | Freq: Four times a day (QID) | ORAL | Status: AC | PRN

## 2012-06-15 MED ORDER — METOCLOPRAMIDE HCL 5 MG/ML IJ SOLN: 10.0000 mg | Freq: Once | INTRAMUSCULAR | Status: DC | PRN

## 2012-06-15 MED ORDER — LACTATED RINGERS IV SOLN
INTRAVENOUS | Status: DC
  Administered 2012-06-15 (×2): via INTRAVENOUS

## 2012-06-15 MED ORDER — ACETAMINOPHEN 10 MG/ML IV SOLN: 1000.0000 mg | Freq: Once | INTRAVENOUS | Status: DC

## 2012-06-15 MED ORDER — OXYCODONE HCL 5 MG PO TABS
5.0000 mg | ORAL_TABLET | Freq: Once | ORAL | Status: AC | PRN
  Administered 2012-06-15: 5 mg via ORAL

## 2012-06-15 SURGICAL SUPPLY — 84 items
APL SKNCLS STERI-STRIP NONHPOA (GAUZE/BANDAGES/DRESSINGS)
BAG DECANTER FOR FLEXI CONT (MISCELLANEOUS) ×1 IMPLANT
BANDAGE ELASTIC 3 VELCRO ST LF (GAUZE/BANDAGES/DRESSINGS) IMPLANT
BANDAGE ELASTIC 4 VELCRO ST LF (GAUZE/BANDAGES/DRESSINGS) ×1 IMPLANT
BANDAGE ELASTIC 6 VELCRO ST LF (GAUZE/BANDAGES/DRESSINGS) ×1 IMPLANT
BANDAGE GAUZE ELAST BULKY 4 IN (GAUZE/BANDAGES/DRESSINGS) IMPLANT
BENZOIN TINCTURE PRP APPL 2/3 (GAUZE/BANDAGES/DRESSINGS) IMPLANT
BLADE MINI RND TIP GREEN BEAV (BLADE) IMPLANT
BLADE SURG 10 STRL SS (BLADE) ×1 IMPLANT
BLADE SURG 15 STRL LF DISP TIS (BLADE) ×1 IMPLANT
BLADE SURG 15 STRL SS (BLADE) ×2
BNDG COHESIVE 1X5 TAN STRL LF (GAUZE/BANDAGES/DRESSINGS) IMPLANT
BNDG COHESIVE 4X5 TAN STRL (GAUZE/BANDAGES/DRESSINGS) IMPLANT
CANISTER OMNI JUG 16 LITER (MISCELLANEOUS) IMPLANT
CANISTER SUCTION 1200CC (MISCELLANEOUS) ×1 IMPLANT
CANISTER SUCTION 2500CC (MISCELLANEOUS) IMPLANT
CHLORAPREP W/TINT 26ML (MISCELLANEOUS) IMPLANT
CLOTH BEACON ORANGE TIMEOUT ST (SAFETY) ×2 IMPLANT
CORDS BIPOLAR (ELECTRODE) IMPLANT
COVER MAYO STAND STRL (DRAPES) ×2 IMPLANT
COVER TABLE BACK 60X90 (DRAPES) ×2 IMPLANT
DECANTER SPIKE VIAL GLASS SM (MISCELLANEOUS) IMPLANT
DRAIN PENROSE 1/2X12 LTX STRL (WOUND CARE) IMPLANT
DRAPE EXTREMITY T 121X128X90 (DRAPE) ×1 IMPLANT
DRAPE INCISE IOBAN 66X45 STRL (DRAPES) ×1 IMPLANT
DRSG EMULSION OIL 3X3 NADH (GAUZE/BANDAGES/DRESSINGS) ×2 IMPLANT
DRSG PAD ABDOMINAL 8X10 ST (GAUZE/BANDAGES/DRESSINGS) IMPLANT
ELECT COATED BLADE 2.86 ST (ELECTRODE) ×1 IMPLANT
ELECT REM PT RETURN 9FT ADLT (ELECTROSURGICAL) ×2
ELECTRODE REM PT RTRN 9FT ADLT (ELECTROSURGICAL) ×1 IMPLANT
GAUZE SPONGE 4X4 12PLY STRL LF (GAUZE/BANDAGES/DRESSINGS) IMPLANT
GAUZE XEROFORM 1X8 LF (GAUZE/BANDAGES/DRESSINGS) IMPLANT
GAUZE XEROFORM 5X9 LF (GAUZE/BANDAGES/DRESSINGS) IMPLANT
GLOVE BIO SURGEON STRL SZ 6.5 (GLOVE) ×2 IMPLANT
GOWN PREVENTION PLUS XLARGE (GOWN DISPOSABLE) ×3 IMPLANT
HANDPIECE INTERPULSE COAX TIP (DISPOSABLE)
IV NS IRRIG 3000ML ARTHROMATIC (IV SOLUTION) IMPLANT
MATRIX SURGICAL PSM 10X15CM (Tissue) ×1 IMPLANT
MICROMATRIX 500MG (Tissue) ×2 IMPLANT
NDL HYPO 30GX1 BEV (NEEDLE) IMPLANT
NEEDLE 27GAX1X1/2 (NEEDLE) IMPLANT
NEEDLE HYPO 30GX1 BEV (NEEDLE) IMPLANT
NS IRRIG 1000ML POUR BTL (IV SOLUTION) ×3 IMPLANT
PACK BASIN DAY SURGERY FS (CUSTOM PROCEDURE TRAY) ×2 IMPLANT
PADDING CAST ABS 3INX4YD NS (CAST SUPPLIES)
PADDING CAST ABS 4INX4YD NS (CAST SUPPLIES)
PADDING CAST ABS COTTON 3X4 (CAST SUPPLIES) IMPLANT
PADDING CAST ABS COTTON 4X4 ST (CAST SUPPLIES) IMPLANT
PENCIL BUTTON HOLSTER BLD 10FT (ELECTRODE) ×1 IMPLANT
SET HNDPC FAN SPRY TIP SCT (DISPOSABLE) IMPLANT
SHEET MEDIUM DRAPE 40X70 STRL (DRAPES) IMPLANT
SLEEVE SCD COMPRESS KNEE MED (MISCELLANEOUS) ×1 IMPLANT
SOLUTION PARTIC MCRMTRX 500MG (Tissue) IMPLANT
SPLINT PLASTER CAST XFAST 3X15 (CAST SUPPLIES) IMPLANT
SPLINT PLASTER XTRA FASTSET 3X (CAST SUPPLIES)
SPONGE GAUZE 4X4 12PLY (GAUZE/BANDAGES/DRESSINGS) ×2 IMPLANT
SPONGE LAP 18X18 X RAY DECT (DISPOSABLE) ×1 IMPLANT
SPONGE LAP 4X18 X RAY DECT (DISPOSABLE) IMPLANT
STAPLER VISISTAT 35W (STAPLE) ×1 IMPLANT
STOCKINETTE 4X48 STRL (DRAPES) IMPLANT
STOCKINETTE 6  STRL (DRAPES) ×1
STOCKINETTE 6 STRL (DRAPES) ×1 IMPLANT
STOCKINETTE IMPERVIOUS LG (DRAPES) IMPLANT
STRIP CLOSURE SKIN 1/2X4 (GAUZE/BANDAGES/DRESSINGS) IMPLANT
SUCTION FRAZIER TIP 10 FR DISP (SUCTIONS) IMPLANT
SURGILUBE 2OZ TUBE FLIPTOP (MISCELLANEOUS) ×1 IMPLANT
SUT ETHILON 3 0 PS 1 (SUTURE) IMPLANT
SUT ETHILON 4 0 P 3 18 (SUTURE) IMPLANT
SUT ETHILON 5 0 PS 2 18 (SUTURE) IMPLANT
SUT PROLENE 3 0 PS 2 (SUTURE) IMPLANT
SUT SILK 3 0 PS 1 (SUTURE) IMPLANT
SUT VIC AB 3-0 FS2 27 (SUTURE) IMPLANT
SUT VIC AB 5-0 P-3 18X BRD (SUTURE) IMPLANT
SUT VIC AB 5-0 P3 18 (SUTURE)
SUT VIC AB 5-0 PS2 18 (SUTURE) ×2 IMPLANT
SYR BULB IRRIGATION 50ML (SYRINGE) ×1 IMPLANT
SYR CONTROL 10ML LL (SYRINGE) ×1 IMPLANT
TAPE HYPAFIX 6X30 (GAUZE/BANDAGES/DRESSINGS) IMPLANT
TOWEL OR 17X24 6PK STRL BLUE (TOWEL DISPOSABLE) ×2 IMPLANT
TRAY DSU PREP LF (CUSTOM PROCEDURE TRAY) IMPLANT
TUBE CONNECTING 20X1/4 (TUBING) IMPLANT
UNDERPAD 30X30 INCONTINENT (UNDERPADS AND DIAPERS) ×2 IMPLANT
WATER STERILE IRR 1000ML POUR (IV SOLUTION) ×1 IMPLANT
YANKAUER SUCT BULB TIP NO VENT (SUCTIONS) ×1 IMPLANT

## 2012-06-15 NOTE — Anesthesia Preprocedure Evaluation (Signed)
Anesthesia Evaluation  Patient identified by MRN, date of birth, ID band Patient awake    Reviewed: Allergy & Precautions, H&P , NPO status , Patient's Chart, lab work & pertinent test results, reviewed documented beta blocker date and time   Airway Mallampati: II TM Distance: >3 FB Neck ROM: full    Dental   Pulmonary asthma ,          Cardiovascular hypertension, Pt. on medications and Pt. on home beta blockers + CAD and +CHF     Neuro/Psych CVA, Residual Symptoms negative psych ROS   GI/Hepatic negative GI ROS, Neg liver ROS,   Endo/Other  Diabetes mellitus-  Renal/GU negative Renal ROS  negative genitourinary   Musculoskeletal   Abdominal   Peds  Hematology negative hematology ROS (+)   Anesthesia Other Findings See surgeon's H&P   Reproductive/Obstetrics negative OB ROS                           Anesthesia Physical Anesthesia Plan  ASA: III  Anesthesia Plan: General   Post-op Pain Management:    Induction: Intravenous  Airway Management Planned: LMA  Additional Equipment:   Intra-op Plan:   Post-operative Plan: Extubation in OR  Informed Consent: I have reviewed the patients History and Physical, chart, labs and discussed the procedure including the risks, benefits and alternatives for the proposed anesthesia with the patient or authorized representative who has indicated his/her understanding and acceptance.   Dental Advisory Given  Plan Discussed with: CRNA and Surgeon  Anesthesia Plan Comments:         Anesthesia Quick Evaluation

## 2012-06-15 NOTE — Discharge Instructions (Addendum)
Return to office Monday , keep dressing dry and intact, weight bear as tolerated   Post Anesthesia Home Care Instructions  Activity: Get plenty of rest for the remainder of the day. A responsible adult should stay with you for 24 hours following the procedure.  For the next 24 hours, DO NOT: -Drive a car -Advertising copywriter -Drink alcoholic beverages -Take any medication unless instructed by your physician -Make any legal decisions or sign important papers.  Meals: Start with liquid foods such as gelatin or soup. Progress to regular foods as tolerated. Avoid greasy, spicy, heavy foods. If nausea and/or vomiting occur, drink only clear liquids until the nausea and/or vomiting subsides. Call your physician if vomiting continues.  Special Instructions/Symptoms: Your throat may feel dry or sore from the anesthesia or the breathing tube placed in your throat during surgery. If this causes discomfort, gargle with warm salt water. The discomfort should disappear within 24 hours.    Call your surgeon if you experience:   1.  Fever over 101.0. 2.  Inability to urinate. 3.  Nausea and/or vomiting. 4.  Extreme swelling or bruising at the surgical site. 5.  Continued bleeding from the incision. 6.  Increased pain, redness or drainage from the incision. 7.  Problems related to your pain medication.  8. VAC to 100 mmHg pressure continuous. Do not change.

## 2012-06-15 NOTE — Anesthesia Postprocedure Evaluation (Signed)
  Anesthesia Post-op Note  Patient: Anthony Holder  Procedure(s) Performed: Procedure(s) (LRB): IRRIGATION AND DEBRIDEMENT WOUND (Left)  Patient Location: PACU  Anesthesia Type: General  Level of Consciousness: awake, alert  and oriented  Airway and Oxygen Therapy: Patient Spontanous Breathing  Post-op Pain: moderate  Post-op Assessment: Post-op Vital signs reviewed, Patient's Cardiovascular Status Stable, Respiratory Function Stable, Patent Airway and No signs of Nausea or vomiting  Post-op Vital Signs: Reviewed and stable  Complications: No apparent anesthesia complications

## 2012-06-15 NOTE — Transfer of Care (Signed)
Immediate Anesthesia Transfer of Care Note  Patient: Anthony Holder  Procedure(s) Performed: Procedure(s) (LRB): IRRIGATION AND DEBRIDEMENT WOUND (Left)  Patient Location: PACU  Anesthesia Type: General  Level of Consciousness: awake, alert , oriented and patient cooperative  Airway & Oxygen Therapy: Patient Spontanous Breathing and Patient connected to face mask oxygen  Post-op Assessment: Report given to PACU RN and Post -op Vital signs reviewed and stable  Post vital signs: Reviewed and stable  Complications: No apparent anesthesia complications
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
## 2012-06-15 NOTE — Brief Op Note (Signed)
06/15/2012  7:22 PM  PATIENT:  Clovis Pu  71 y.o. male  PRE-OPERATIVE DIAGNOSIS:  left leg diabetic ulcer  POST-OPERATIVE DIAGNOSIS:  left leg diabetic ulcer  PROCEDURE:  Procedure(s) (LRB): IRRIGATION AND DEBRIDEMENT WOUND WITH ACELL PLACEMENT AND THE VAC  SURGEON:  Surgeon(s) and Role:    * Yoav Okane Sanger, DO - Primary  PHYSICIAN ASSISTANT:   ASSISTANTS: none   ANESTHESIA:   general  EBL:     BLOOD ADMINISTERED:none  DRAINS: none   LOCAL MEDICATIONS USED:  NONE  SPECIMEN:  No Specimen  DISPOSITION OF SPECIMEN:  N/A  COUNTS:  YES  TOURNIQUET:  * No tourniquets in log *  DICTATION: dictated  PLAN OF CARE: Discharge to home after PACU  PATIENT DISPOSITION:  PACU - hemodynamically stable.   Delay start of Pharmacological VTE agent (>24hrs) due to surgical blood loss or risk of bleeding: no

## 2012-06-15 NOTE — Anesthesia Procedure Notes (Signed)
Procedure Name: LMA Insertion Date/Time: 06/15/2012 4:52 PM Performed by: Verlan Friends Pre-anesthesia Checklist: Patient identified, Emergency Drugs available, Suction available, Patient being monitored and Timeout performed Patient Re-evaluated:Patient Re-evaluated prior to inductionOxygen Delivery Method: Circle System Utilized Preoxygenation: Pre-oxygenation with 100% oxygen Intubation Type: IV induction Ventilation: Mask ventilation without difficulty LMA: LMA with gastric port inserted LMA Size: 5.0 Number of attempts: 1 Airway Equipment and Method: bite block Placement Confirmation: positive ETCO2 Tube secured with: Tape Dental Injury: Teeth and Oropharynx as per pre-operative assessment

## 2012-06-16 NOTE — Op Note (Signed)
NAMELUISALBERTO, BEEGLE NO.:  [id number]  MEDICAL RECORD NO.:  [id number]  LOCATION:  FOOT                         FACILITY:  [facility]  PHYSICIAN:  Wayland Denis, DO      DATE OF BIRTH:  Nov 07, 1941  DATE OF PROCEDURE:  06/15/2012 DATE OF DISCHARGE:                              OPERATIVE REPORT   PREOPERATIVE DIAGNOSIS:  Left foot ulcer.  POSTOPERATIVE DIAGNOSIS:  Left foot ulcer.  PROCEDURE:  Irrigation and debridement of skin, subcutaneous tissue for purposes of preparation for placement of ACell 500 mg of powder and sheet for a 10 x 3.5 cm ulceration with placement of VAC sponge.  ATTENDING:  Wayland Denis, DO  ANESTHESIA:  General.  INDICATION FOR PROCEDURE:  The patient is a 71 year old man who has been suffering with a left lower extremity ulceration.  He has undergone irrigation and debridement multiple times with placement of ACell and the VAC.  He is being treated by Dr. Victorino Dike and myself.  Consent was confirmed.  Risks and complications reviewed, which included bleeding, pain, scar, risk of anesthesia, and possible need for amputation.  The patient was marked and taken to the operating room.  DESCRIPTION OF PROCEDURE:  The patient was placed on the operating room table in supine position.  General anesthesia was administered.  Once adequate, a time-out was called and all information was confirmed to be correct.  He was prepped and draped in the usual sterile fashion. Copious amounts of irrigation with antibiotic solution was used to irrigate the wound.  Prior to prepping the VAC, the Adaptic were removed from his previous surgery where ACell had been placed.  There was mostly incorporation of the ACell with a small piece on the superior or distal aspect of the plantar foot and that was removed.  The #10 blade was used to debride the skin and subcutaneous tissue.  There was tendon exposed at the distal portion near the base of the great toe.  There was  not granulation tissue distally, but there was improved granulation tissue proximally, and the area was filling in much better.  After debriding the area, the ACell powder was placed on the ACell sheet.  The ACell sheet was prepared according to the manufacture guidelines and pie crusted.  Powder was placed on the sheet and then the sheet was sutured to the foot with 5-0 Vicryl.  Adaptic was then placed and stapled onto the foot. Hydrogel was placed with the VAC sponge and Ioban for seal.  The VAC was placed at 100 mmHg.  The patient tolerated the procedure well.  There were no complications.  He was awoken and taken to recovery room in stable condition.     Wayland Denis, DO     CS/MEDQ  D:  06/15/2012  T:  06/16/2012  Job:  [id number]

## 2012-06-16 NOTE — Op Note (Deleted)
NAME:  Anthony Holder, Anthony Holder             ACCOUNT NO.:  [id number]  MEDICAL RECORD NO.:  [id number]  LOCATION:  FOOT                         FACILITY:  [facility]  PHYSICIAN:  Tianah Lonardo Sanger, DO      DATE OF BIRTH:  04/28/1941  DATE OF PROCEDURE:  06/15/2012 DATE OF DISCHARGE:                              OPERATIVE REPORT   PREOPERATIVE DIAGNOSIS:  Left foot ulcer.  POSTOPERATIVE DIAGNOSIS:  Left foot ulcer.  PROCEDURE:  Irrigation and debridement of skin, subcutaneous tissue for purposes of preparation for placement of ACell 500 mg of powder and sheet for a 10 x 3.5 cm ulceration with placement of VAC sponge.  ATTENDING:  Obera Stauch Sanger, DO  ANESTHESIA:  General.  INDICATION FOR PROCEDURE:  The patient is a 71-year-old man who has been suffering with a left lower extremity ulceration.  He has undergone irrigation and debridement multiple times with placement of ACell and the VAC.  He is being treated by Dr. Hewitt and myself.  Consent was confirmed.  Risks and complications reviewed, which included bleeding, pain, scar, risk of anesthesia, and possible need for amputation.  The patient was marked and taken to the operating room.  DESCRIPTION OF PROCEDURE:  The patient was placed on the operating room table in supine position.  General anesthesia was administered.  Once adequate, a time-out was called and all information was confirmed to be correct.  He was prepped and draped in the usual sterile fashion. Copious amounts of irrigation with antibiotic solution was used to irrigate the wound.  Prior to prepping the VAC, the Adaptic were removed from his previous surgery where ACell had been placed.  There was mostly incorporation of the ACell with a small piece on the superior or distal aspect of the plantar foot and that was removed.  The #10 blade was used to debride the skin and subcutaneous tissue.  There was tendon exposed at the distal portion near the base of the great toe.  There was  not granulation tissue distally, but there was improved granulation tissue proximally, and the area was filling in much better.  After debriding the area, the ACell powder was placed on the ACell sheet.  The ACell sheet was prepared according to the manufacture guidelines and pie crusted.  Powder was placed on the sheet and then the sheet was sutured to the foot with 5-0 Vicryl.  Adaptic was then placed and stapled onto the foot. Hydrogel was placed with the VAC sponge and Ioban for seal.  The VAC was placed at 100 mmHg.  The patient tolerated the procedure well.  There were no complications.  He was awoken and taken to recovery room in stable condition.     Marymargaret Kirker Sanger, DO     CS/MEDQ  D:  06/15/2012  T:  06/16/2012  Job:  [id number] 

## 2012-06-19 ENCOUNTER — Encounter (HOSPITAL_BASED_OUTPATIENT_CLINIC_OR_DEPARTMENT_OTHER): Payer: Medicare Other | Attending: Plastic Surgery

## 2012-06-19 ENCOUNTER — Encounter (HOSPITAL_BASED_OUTPATIENT_CLINIC_OR_DEPARTMENT_OTHER): Payer: Self-pay | Admitting: Plastic Surgery

## 2012-06-19 DIAGNOSIS — L97509 Non-pressure chronic ulcer of other part of unspecified foot with unspecified severity: Secondary | ICD-10-CM | POA: Insufficient documentation

## 2012-06-19 DIAGNOSIS — E1169 Type 2 diabetes mellitus with other specified complication: Secondary | ICD-10-CM | POA: Insufficient documentation

## 2012-06-20 NOTE — Progress Notes (Signed)
[facility]  NAME:  AADYN, BUCHHEIT NO.:  [id number]  MEDICAL RECORD NO.:  [id number]      DATE OF BIRTH:  [birth year]  PHYSICIAN:  Wayland Denis, DO       VISIT DATE:  06/19/2012                                  OFFICE VISIT   Mr. Rawles is a 71 year old gentleman, who is here for followup on his left lower extremity foot ulcer.  The ACell is still incorporating, but it looks really good.  He has had quite a bit of granulation and filling in on the proximal portion of the wound.  The distal wound is not much changed.  He is at home.  No change in his medications.  He is otherwise stable.  We will continue with the [facility] and let Dr. Victorino Dike know, he may decide on Trans Med at this point.     Wayland Denis, DO     CS/MEDQ  D:  06/19/2012  T:  06/19/2012  Job:  [id number]

## 2012-06-21 LAB — GLUCOSE, CAPILLARY: Glucose-Capillary: 210 mg/dL — ABNORMAL HIGH (ref 70–99)

## 2012-06-26 LAB — GLUCOSE, CAPILLARY: Glucose-Capillary: 296 mg/dL — ABNORMAL HIGH (ref 70–99)

## 2012-06-27 LAB — GLUCOSE, CAPILLARY: Glucose-Capillary: 188 mg/dL — ABNORMAL HIGH (ref 70–99)

## 2012-06-28 LAB — GLUCOSE, CAPILLARY
Glucose-Capillary: 231 mg/dL — ABNORMAL HIGH (ref 70–99)
Glucose-Capillary: 176 mg/dL — ABNORMAL HIGH (ref 70–99)

## 2012-06-29 LAB — GLUCOSE, CAPILLARY
Glucose-Capillary: 241 mg/dL — ABNORMAL HIGH (ref 70–99)
Glucose-Capillary: 227 mg/dL — ABNORMAL HIGH (ref 70–99)

## 2012-06-29 NOTE — Progress Notes (Signed)
Pt has been her x3-will need istat Bringing all his supplies

## 2012-06-30 ENCOUNTER — Other Ambulatory Visit (HOSPITAL_COMMUNITY): Payer: Self-pay | Admitting: Internal Medicine

## 2012-06-30 ENCOUNTER — Ambulatory Visit (HOSPITAL_COMMUNITY)
Admission: RE | Admit: 2012-06-30 | Discharge: 2012-06-30 | Disposition: A | Discharge status: Home / Self Care | Payer: Medicare Other | Source: Ambulatory Visit | Attending: Internal Medicine | Admitting: Internal Medicine

## 2012-06-30 DIAGNOSIS — R5381 Other malaise: Secondary | ICD-10-CM | POA: Insufficient documentation

## 2012-06-30 DIAGNOSIS — I10 Essential (primary) hypertension: Secondary | ICD-10-CM | POA: Insufficient documentation

## 2012-06-30 DIAGNOSIS — R0602 Shortness of breath: Secondary | ICD-10-CM

## 2012-07-03 ENCOUNTER — Encounter (HOSPITAL_COMMUNITY): Payer: Self-pay | Admitting: *Deleted

## 2012-07-03 ENCOUNTER — Encounter (HOSPITAL_COMMUNITY): Payer: Self-pay | Admitting: Pharmacy Technician

## 2012-07-03 NOTE — Progress Notes (Signed)
Left message for [facility], Dr. [facility] office, requesting orders for surgery

## 2012-07-04 ENCOUNTER — Encounter (HOSPITAL_COMMUNITY): Payer: Self-pay | Admitting: Certified Registered Nurse Anesthetist

## 2012-07-04 ENCOUNTER — Ambulatory Visit (HOSPITAL_BASED_OUTPATIENT_CLINIC_OR_DEPARTMENT_OTHER): Admission: RE | Admit: 2012-07-04 | Payer: Medicare Other | Source: Ambulatory Visit | Admitting: Orthopedic Surgery

## 2012-07-04 ENCOUNTER — Encounter (HOSPITAL_COMMUNITY)
Admission: RE | Disposition: A | Discharge status: Home / Self Care | Payer: Self-pay | Source: Ambulatory Visit | Attending: Orthopedic Surgery

## 2012-07-04 ENCOUNTER — Encounter (HOSPITAL_COMMUNITY): Payer: Self-pay | Admitting: *Deleted

## 2012-07-04 ENCOUNTER — Inpatient Hospital Stay (HOSPITAL_COMMUNITY)
Admission: RE | Admit: 2012-07-04 | Discharge: 2012-07-06 | DRG: 617 | Disposition: A | Discharge status: Home / Self Care | Payer: Medicare Other | Source: Ambulatory Visit | Attending: Orthopedic Surgery | Admitting: Orthopedic Surgery

## 2012-07-04 ENCOUNTER — Ambulatory Visit (HOSPITAL_COMMUNITY): Payer: Medicare Other | Admitting: Certified Registered Nurse Anesthetist

## 2012-07-04 DIAGNOSIS — E1169 Type 2 diabetes mellitus with other specified complication: Principal | ICD-10-CM | POA: Diagnosis present

## 2012-07-04 DIAGNOSIS — Z8673 Personal history of transient ischemic attack (TIA), and cerebral infarction without residual deficits: Secondary | ICD-10-CM

## 2012-07-04 DIAGNOSIS — Z87891 Personal history of nicotine dependence: Secondary | ICD-10-CM

## 2012-07-04 DIAGNOSIS — L97809 Non-pressure chronic ulcer of other part of unspecified lower leg with unspecified severity: Secondary | ICD-10-CM | POA: Diagnosis present

## 2012-07-04 DIAGNOSIS — Z7982 Long term (current) use of aspirin: Secondary | ICD-10-CM

## 2012-07-04 DIAGNOSIS — I2510 Atherosclerotic heart disease of native coronary artery without angina pectoris: Secondary | ICD-10-CM | POA: Diagnosis present

## 2012-07-04 DIAGNOSIS — Z951 Presence of aortocoronary bypass graft: Secondary | ICD-10-CM

## 2012-07-04 DIAGNOSIS — E7800 Pure hypercholesterolemia, unspecified: Secondary | ICD-10-CM | POA: Diagnosis present

## 2012-07-04 DIAGNOSIS — S98139A Complete traumatic amputation of one unspecified lesser toe, initial encounter: Secondary | ICD-10-CM

## 2012-07-04 DIAGNOSIS — M869 Osteomyelitis, unspecified: Secondary | ICD-10-CM

## 2012-07-04 DIAGNOSIS — Z794 Long term (current) use of insulin: Secondary | ICD-10-CM

## 2012-07-04 DIAGNOSIS — E11621 Type 2 diabetes mellitus with foot ulcer: Secondary | ICD-10-CM

## 2012-07-04 HISTORY — PX: AMPUTATION: SHX166

## 2012-07-04 LAB — CBC
HCT: 34.7 % — ABNORMAL LOW (ref 39.0–52.0)
Hemoglobin: 11.7 g/dL — ABNORMAL LOW (ref 13.0–17.0)
MCV: 79.8 fL (ref 78.0–100.0)
RBC: 4.35 MIL/uL (ref 4.22–5.81)
RDW: 14.3 % (ref 11.5–15.5)
WBC: 16.9 10*3/uL — ABNORMAL HIGH (ref 4.0–10.5)

## 2012-07-04 LAB — BASIC METABOLIC PANEL
BUN: 22 mg/dL (ref 6–23)
CO2: 25 mEq/L (ref 19–32)
Chloride: 99 mEq/L (ref 96–112)
Creatinine, Ser: 1 mg/dL (ref 0.50–1.35)
Glucose, Bld: 163 mg/dL — ABNORMAL HIGH (ref 70–99)
Potassium: 5.2 mEq/L — ABNORMAL HIGH (ref 3.5–5.1)

## 2012-07-04 LAB — GLUCOSE, CAPILLARY

## 2012-07-04 LAB — SURGICAL PCR SCREEN: Staphylococcus aureus: NEGATIVE

## 2012-07-04 SURGERY — AMPUTATION, FOOT, RAY
Anesthesia: General | Laterality: Left

## 2012-07-04 SURGERY — AMPUTATION BELOW KNEE
Anesthesia: General | Site: Knee | Laterality: Left | Wound class: Dirty or Infected

## 2012-07-04 MED ORDER — METOCLOPRAMIDE HCL 5 MG/ML IJ SOLN: 5.0000 mg | Freq: Three times a day (TID) | INTRAMUSCULAR | Status: DC | PRN

## 2012-07-04 MED ORDER — ROCURONIUM BROMIDE 100 MG/10ML IV SOLN
INTRAVENOUS | Status: DC | PRN
  Administered 2012-07-04: 40 mg via INTRAVENOUS

## 2012-07-04 MED ORDER — INSULIN ASPART 100 UNIT/ML ~~LOC~~ SOLN
0.0000 [IU] | Freq: Three times a day (TID) | SUBCUTANEOUS | Status: DC
  Administered 2012-07-05 – 2012-07-06 (×3): 3 [IU] via SUBCUTANEOUS

## 2012-07-04 MED ORDER — ASPIRIN EC 81 MG PO TBEC
81.0000 mg | DELAYED_RELEASE_TABLET | Freq: Every day | ORAL | Status: DC
  Administered 2012-07-05: 81 mg via ORAL
  Filled 2012-07-04 (×3): qty 1

## 2012-07-04 MED ORDER — LACTATED RINGERS IV SOLN
INTRAVENOUS | Status: DC
  Administered 2012-07-04: 50 mL/h via INTRAVENOUS

## 2012-07-04 MED ORDER — HYDROMORPHONE HCL PF 1 MG/ML IJ SOLN
INTRAMUSCULAR | Status: AC
  Filled 2012-07-04: qty 1

## 2012-07-04 MED ORDER — ONDANSETRON HCL 4 MG/2ML IJ SOLN
4.0000 mg | Freq: Four times a day (QID) | INTRAMUSCULAR | Status: DC | PRN
  Filled 2012-07-04: qty 2

## 2012-07-04 MED ORDER — POTASSIUM CHLORIDE CRYS ER 20 MEQ PO TBCR
20.0000 meq | EXTENDED_RELEASE_TABLET | ORAL | Status: DC
  Administered 2012-07-05: 20 meq via ORAL
  Filled 2012-07-04: qty 1

## 2012-07-04 MED ORDER — HYDROMORPHONE HCL PF 1 MG/ML IJ SOLN: 0.2500 mg | INTRAMUSCULAR | Status: DC | PRN

## 2012-07-04 MED ORDER — PROPOFOL 10 MG/ML IV EMUL
INTRAVENOUS | Status: DC | PRN
  Administered 2012-07-04: 180 mg via INTRAVENOUS

## 2012-07-04 MED ORDER — FENTANYL CITRATE 0.05 MG/ML IJ SOLN
INTRAMUSCULAR | Status: DC | PRN
  Administered 2012-07-04: 100 ug via INTRAVENOUS

## 2012-07-04 MED ORDER — METFORMIN HCL ER 500 MG PO TB24
1000.0000 mg | ORAL_TABLET | Freq: Two times a day (BID) | ORAL | Status: DC
  Administered 2012-07-04 – 2012-07-05 (×3): 1000 mg via ORAL
  Filled 2012-07-04 (×6): qty 2

## 2012-07-04 MED ORDER — LACTATED RINGERS IV SOLN
INTRAVENOUS | Status: DC | PRN
  Administered 2012-07-04: 13:00:00 via INTRAVENOUS

## 2012-07-04 MED ORDER — SENNA 8.6 MG PO TABS
1.0000 | ORAL_TABLET | Freq: Two times a day (BID) | ORAL | Status: DC
  Administered 2012-07-04 – 2012-07-05 (×3): 8.6 mg via ORAL
  Filled 2012-07-04 (×5): qty 1

## 2012-07-04 MED ORDER — MUPIROCIN 2 % EX OINT
TOPICAL_OINTMENT | CUTANEOUS | Status: AC
  Administered 2012-07-04: 1 via NASAL
  Filled 2012-07-04: qty 22

## 2012-07-04 MED ORDER — DIPHENHYDRAMINE HCL 12.5 MG/5ML PO ELIX
12.5000 mg | ORAL_SOLUTION | ORAL | Status: DC | PRN
  Administered 2012-07-04: 12.5 mg via ORAL
  Filled 2012-07-04: qty 5

## 2012-07-04 MED ORDER — PROMETHAZINE HCL 25 MG/ML IJ SOLN: 6.2500 mg | INTRAMUSCULAR | Status: DC | PRN

## 2012-07-04 MED ORDER — LIDOCAINE HCL (CARDIAC) 20 MG/ML IV SOLN
INTRAVENOUS | Status: DC | PRN
  Administered 2012-07-04: 80 mg via INTRAVENOUS

## 2012-07-04 MED ORDER — KETOROLAC TROMETHAMINE 30 MG/ML IJ SOLN
15.0000 mg | Freq: Once | INTRAMUSCULAR | Status: AC | PRN
  Administered 2012-07-04: 15 mg via INTRAVENOUS

## 2012-07-04 MED ORDER — ZOLPIDEM TARTRATE 5 MG PO TABS
5.0000 mg | ORAL_TABLET | Freq: Every evening | ORAL | Status: DC | PRN
  Administered 2012-07-05: 5 mg via ORAL
  Filled 2012-07-04: qty 1

## 2012-07-04 MED ORDER — METHOCARBAMOL 500 MG PO TABS
500.0000 mg | ORAL_TABLET | Freq: Four times a day (QID) | ORAL | Status: DC | PRN
  Administered 2012-07-04 – 2012-07-05 (×2): 500 mg via ORAL
  Filled 2012-07-04 (×3): qty 1

## 2012-07-04 MED ORDER — SODIUM CHLORIDE 0.9 % IV SOLN
INTRAVENOUS | Status: DC
  Administered 2012-07-04 – 2012-07-05 (×2): via INTRAVENOUS

## 2012-07-04 MED ORDER — BACID PO TABS
2.0000 | ORAL_TABLET | Freq: Three times a day (TID) | ORAL | Status: DC
  Filled 2012-07-04: qty 2

## 2012-07-04 MED ORDER — SODIUM CHLORIDE 0.9 % IV SOLN: INTRAVENOUS | Status: DC

## 2012-07-04 MED ORDER — MEPERIDINE HCL 25 MG/ML IJ SOLN: 6.2500 mg | INTRAMUSCULAR | Status: DC | PRN

## 2012-07-04 MED ORDER — VITAMIN C 500 MG PO TABS
500.0000 mg | ORAL_TABLET | Freq: Three times a day (TID) | ORAL | Status: DC
  Filled 2012-07-04 (×8): qty 1

## 2012-07-04 MED ORDER — COLESEVELAM HCL 625 MG PO TABS
1875.0000 mg | ORAL_TABLET | Freq: Two times a day (BID) | ORAL | Status: DC
  Administered 2012-07-04 – 2012-07-06 (×4): 1875 mg via ORAL
  Filled 2012-07-04 (×6): qty 3

## 2012-07-04 MED ORDER — BACITRACIN ZINC 500 UNIT/GM EX OINT
TOPICAL_OINTMENT | CUTANEOUS | Status: AC
  Filled 2012-07-04: qty 15

## 2012-07-04 MED ORDER — RISAQUAD PO CAPS
2.0000 | ORAL_CAPSULE | Freq: Three times a day (TID) | ORAL | Status: DC
  Administered 2012-07-04 – 2012-07-05 (×2): 2 via ORAL
  Filled 2012-07-04 (×7): qty 2

## 2012-07-04 MED ORDER — NEOSTIGMINE METHYLSULFATE 1 MG/ML IJ SOLN
INTRAMUSCULAR | Status: DC | PRN
  Administered 2012-07-04: 4 mg via INTRAVENOUS

## 2012-07-04 MED ORDER — CHLORHEXIDINE GLUCONATE 4 % EX LIQD: 60.0000 mL | Freq: Once | CUTANEOUS | Status: DC

## 2012-07-04 MED ORDER — INSULIN GLARGINE 100 UNIT/ML ~~LOC~~ SOLN: 34.0000 [IU] | Freq: Every day | SUBCUTANEOUS | Status: DC

## 2012-07-04 MED ORDER — GABAPENTIN 300 MG PO CAPS
300.0000 mg | ORAL_CAPSULE | Freq: Three times a day (TID) | ORAL | Status: DC
  Administered 2012-07-04 – 2012-07-05 (×4): 300 mg via ORAL
  Filled 2012-07-04 (×7): qty 1

## 2012-07-04 MED ORDER — VANCOMYCIN HCL IN DEXTROSE 1-5 GM/200ML-% IV SOLN
1000.0000 mg | INTRAVENOUS | Status: AC
  Administered 2012-07-04: 1000 mg via INTRAVENOUS

## 2012-07-04 MED ORDER — ONDANSETRON HCL 4 MG PO TABS: 4.0000 mg | ORAL_TABLET | Freq: Four times a day (QID) | ORAL | Status: DC | PRN

## 2012-07-04 MED ORDER — ONDANSETRON HCL 4 MG/2ML IJ SOLN
INTRAMUSCULAR | Status: DC | PRN
  Administered 2012-07-04: 4 mg via INTRAVENOUS

## 2012-07-04 MED ORDER — DOCUSATE SODIUM 100 MG PO CAPS
100.0000 mg | ORAL_CAPSULE | Freq: Two times a day (BID) | ORAL | Status: DC
  Administered 2012-07-04 – 2012-07-05 (×3): 100 mg via ORAL
  Filled 2012-07-04 (×6): qty 1

## 2012-07-04 MED ORDER — GLYCOPYRROLATE 0.2 MG/ML IJ SOLN
INTRAMUSCULAR | Status: DC | PRN
  Administered 2012-07-04: .6 mg via INTRAVENOUS

## 2012-07-04 MED ORDER — METHOCARBAMOL 100 MG/ML IJ SOLN
500.0000 mg | Freq: Four times a day (QID) | INTRAVENOUS | Status: DC | PRN
  Filled 2012-07-04: qty 5

## 2012-07-04 MED ORDER — OXYCODONE HCL 5 MG PO TABS
5.0000 mg | ORAL_TABLET | ORAL | Status: DC | PRN
  Administered 2012-07-04 – 2012-07-06 (×6): 10 mg via ORAL
  Filled 2012-07-04 (×6): qty 2

## 2012-07-04 MED ORDER — VANCOMYCIN HCL IN DEXTROSE 1-5 GM/200ML-% IV SOLN
INTRAVENOUS | Status: AC
  Filled 2012-07-04: qty 200

## 2012-07-04 MED ORDER — ATENOLOL 25 MG PO TABS
25.0000 mg | ORAL_TABLET | Freq: Every day | ORAL | Status: DC
  Administered 2012-07-05: 25 mg via ORAL
  Filled 2012-07-04 (×2): qty 1

## 2012-07-04 MED ORDER — METOCLOPRAMIDE HCL 10 MG PO TABS
5.0000 mg | ORAL_TABLET | Freq: Three times a day (TID) | ORAL | Status: DC | PRN
  Administered 2012-07-04: 10 mg via ORAL
  Filled 2012-07-04: qty 1

## 2012-07-04 MED ORDER — CYANOCOBALAMIN 500 MCG PO TABS
500.0000 ug | ORAL_TABLET | Freq: Two times a day (BID) | ORAL | Status: DC
  Administered 2012-07-04 – 2012-07-05 (×2): 500 ug via ORAL
  Filled 2012-07-04 (×5): qty 1

## 2012-07-04 MED ORDER — KETOROLAC TROMETHAMINE 30 MG/ML IJ SOLN
INTRAMUSCULAR | Status: AC
  Filled 2012-07-04: qty 1

## 2012-07-04 MED ORDER — MUPIROCIN 2 % EX OINT: TOPICAL_OINTMENT | Freq: Two times a day (BID) | CUTANEOUS | Status: DC

## 2012-07-04 MED ORDER — HYDROMORPHONE HCL PF 1 MG/ML IJ SOLN
0.2500 mg | INTRAMUSCULAR | Status: DC | PRN
  Administered 2012-07-04 (×4): 0.5 mg via INTRAVENOUS

## 2012-07-04 MED ORDER — HYDROMORPHONE HCL PF 1 MG/ML IJ SOLN
1.0000 mg | INTRAMUSCULAR | Status: DC | PRN
  Administered 2012-07-04 – 2012-07-05 (×4): 1 mg via INTRAVENOUS
  Filled 2012-07-04 (×5): qty 1

## 2012-07-04 MED ORDER — ENOXAPARIN SODIUM 40 MG/0.4ML ~~LOC~~ SOLN
40.0000 mg | SUBCUTANEOUS | Status: DC
  Administered 2012-07-05: 40 mg via SUBCUTANEOUS
  Filled 2012-07-04 (×3): qty 0.4

## 2012-07-04 SURGICAL SUPPLY — 63 items
BANDAGE ELASTIC 4 VELCRO ST LF (GAUZE/BANDAGES/DRESSINGS) ×3 IMPLANT
BANDAGE ELASTIC 6 VELCRO ST LF (GAUZE/BANDAGES/DRESSINGS) ×3 IMPLANT
BANDAGE ESMARK 6X9 LF (GAUZE/BANDAGES/DRESSINGS) ×1 IMPLANT
BLADE SAW RECIP 87.9 MT (BLADE) ×3 IMPLANT
BLADE SAW SGTL MED 73X18.5 STR (BLADE) ×2 IMPLANT
BNDG CMPR 9X4 STRL LF SNTH (GAUZE/BANDAGES/DRESSINGS)
BNDG CMPR 9X6 STRL LF SNTH (GAUZE/BANDAGES/DRESSINGS) ×2
BNDG COHESIVE 6X5 TAN STRL LF (GAUZE/BANDAGES/DRESSINGS) ×6 IMPLANT
BNDG ESMARK 4X9 LF (GAUZE/BANDAGES/DRESSINGS) ×1 IMPLANT
BNDG ESMARK 6X9 LF (GAUZE/BANDAGES/DRESSINGS) ×3
BNDG GAUZE STRTCH 6 (GAUZE/BANDAGES/DRESSINGS) ×2 IMPLANT
CHLORAPREP W/TINT 26ML (MISCELLANEOUS) ×3 IMPLANT
CLOTH BEACON ORANGE TIMEOUT ST (SAFETY) ×3 IMPLANT
COVER SURGICAL LIGHT HANDLE (MISCELLANEOUS) ×3 IMPLANT
CUFF TOURNIQUET SINGLE 34IN LL (TOURNIQUET CUFF) ×2 IMPLANT
CUFF TOURNIQUET SINGLE 44IN (TOURNIQUET CUFF) IMPLANT
DRAPE EXTREMITY T 121X128X90 (DRAPE) ×3 IMPLANT
DRAPE INCISE IOBAN 66X45 STRL (DRAPES) ×3 IMPLANT
DRAPE PROXIMA HALF (DRAPES) ×6 IMPLANT
DRAPE U-SHAPE 47X51 STRL (DRAPES) ×6 IMPLANT
DRSG ADAPTIC 3X8 NADH LF (GAUZE/BANDAGES/DRESSINGS) ×3 IMPLANT
DRSG MEPITEL 4X7.2 (GAUZE/BANDAGES/DRESSINGS) ×3 IMPLANT
DRSG PAD ABDOMINAL 8X10 ST (GAUZE/BANDAGES/DRESSINGS) ×3 IMPLANT
DURAPREP 26ML APPLICATOR (WOUND CARE) ×3 IMPLANT
ELECT CAUTERY BLADE 6.4 (BLADE) ×2 IMPLANT
ELECT REM PT RETURN 9FT ADLT (ELECTROSURGICAL) ×3
ELECTRODE REM PT RTRN 9FT ADLT (ELECTROSURGICAL) ×2 IMPLANT
EVACUATOR 1/8 PVC DRAIN (DRAIN) IMPLANT
GLOVE BIO SURGEON STRL SZ8 (GLOVE) ×6 IMPLANT
GLOVE BIOGEL PI IND STRL 8 (GLOVE) ×2 IMPLANT
GLOVE BIOGEL PI INDICATOR 8 (GLOVE) ×1
GOWN PREVENTION PLUS XLARGE (GOWN DISPOSABLE) ×3 IMPLANT
GOWN STRL NON-REIN LRG LVL3 (GOWN DISPOSABLE) ×3 IMPLANT
KIT BASIN OR (CUSTOM PROCEDURE TRAY) ×3 IMPLANT
KIT ROOM TURNOVER OR (KITS) ×3 IMPLANT
MANIFOLD NEPTUNE II (INSTRUMENTS) ×3 IMPLANT
NS IRRIG 1000ML POUR BTL (IV SOLUTION) ×3 IMPLANT
PACK GENERAL/GYN (CUSTOM PROCEDURE TRAY) ×3 IMPLANT
PACK ORTHO EXTREMITY (CUSTOM PROCEDURE TRAY) ×3 IMPLANT
PAD ARMBOARD 7.5X6 YLW CONV (MISCELLANEOUS) ×6 IMPLANT
PAD CAST 4YDX4 CTTN HI CHSV (CAST SUPPLIES) ×2 IMPLANT
PADDING CAST ABS 6INX4YD NS (CAST SUPPLIES) ×1
PADDING CAST ABS COTTON 6X4 NS (CAST SUPPLIES) ×2 IMPLANT
PADDING CAST COTTON 4X4 STRL (CAST SUPPLIES) ×3
PADDING CAST COTTON 6X4 STRL (CAST SUPPLIES) ×3 IMPLANT
SPONGE GAUZE 4X4 12PLY (GAUZE/BANDAGES/DRESSINGS) ×3 IMPLANT
SPONGE LAP 18X18 X RAY DECT (DISPOSABLE) ×3 IMPLANT
STAPLER VISISTAT 35W (STAPLE) ×2 IMPLANT
STOCKINETTE IMPERVIOUS LG (DRAPES) ×2 IMPLANT
SUCTION FRAZIER TIP 10 FR DISP (SUCTIONS) ×3 IMPLANT
SUT ETHILON 2 0 PSLX (SUTURE) ×6 IMPLANT
SUT PDS 2 0 (SUTURE) ×6 IMPLANT
SUT PDS AB 0 CT 36 (SUTURE) ×3 IMPLANT
SUT PROLENE 3 0 SH 48 (SUTURE) IMPLANT
SUT SILK 2 0 (SUTURE) ×3
SUT SILK 2-0 18XBRD TIE 12 (SUTURE) ×2 IMPLANT
SWAB COLLECTION DEVICE MRSA (MISCELLANEOUS) IMPLANT
TOWEL OR 17X24 6PK STRL BLUE (TOWEL DISPOSABLE) ×3 IMPLANT
TOWEL OR 17X26 10 PK STRL BLUE (TOWEL DISPOSABLE) ×3 IMPLANT
TUBE ANAEROBIC SPECIMEN COL (MISCELLANEOUS) IMPLANT
TUBE CONNECTING 12X1/4 (SUCTIONS) ×3 IMPLANT
UNDERPAD 30X30 INCONTINENT (UNDERPADS AND DIAPERS) ×3 IMPLANT
WATER STERILE IRR 1000ML POUR (IV SOLUTION) ×3 IMPLANT

## 2012-07-04 NOTE — Transfer of Care (Signed)
Immediate Anesthesia Transfer of Care Note  Patient: Anthony Holder  Procedure(s) Performed: Procedure(s) (LRB): AMPUTATION BELOW KNEE (Left)  Patient Location: PACU  Anesthesia Type: General  Level of Consciousness: awake and alert   Airway & Oxygen Therapy: Patient Spontanous Breathing and Patient connected to nasal cannula oxygen  Post-op Assessment: Report given to PACU RN and Post -op Vital signs reviewed and stable  Post vital signs: Reviewed and stable  Complications: No apparent anesthesia complications

## 2012-07-04 NOTE — Anesthesia Postprocedure Evaluation (Signed)
Anesthesia Post Note  Patient: Anthony Holder  Procedure(s) Performed: Procedure(s) (LRB): AMPUTATION BELOW KNEE (Left)  Anesthesia type: General  Patient location: PACU  Post pain: Pain level controlled and Adequate analgesia  Post assessment: Post-op Vital signs reviewed, Patient's Cardiovascular Status Stable, Respiratory Function Stable, Patent Airway and Pain level controlled  Last Vitals:  Filed Vitals:   07/04/12 1444  BP:   Pulse:   Temp: 36.2 C  Resp:     Post vital signs: Reviewed and stable  Level of consciousness: awake, alert  and oriented  Complications: No apparent anesthesia complications

## 2012-07-04 NOTE — Anesthesia Preprocedure Evaluation (Addendum)
Anesthesia Evaluation  Patient identified by MRN, date of birth, ID band Patient awake    History of Anesthesia Complications Negative for: history of anesthetic complications  Airway Mallampati: II  Neck ROM: Full    Dental   Pulmonary asthma ,    + decreased breath sounds      Cardiovascular hypertension, Pt. on home beta blockers + CAD and +CHF + Valvular Problems/Murmurs AS Rhythm:Regular Rate:Normal     Neuro/Psych CVA    GI/Hepatic   Endo/Other    Renal/GU      Musculoskeletal   Abdominal (+) + obese,   Peds  Hematology   Anesthesia Other Findings   Reproductive/Obstetrics                          Anesthesia Physical Anesthesia Plan  ASA: III  Anesthesia Plan: General   Post-op Pain Management:    Induction: Intravenous  Airway Management Planned: Oral ETT  Additional Equipment:   Intra-op Plan:   Post-operative Plan: Extubation in OR  Informed Consent: I have reviewed the patients History and Physical, chart, labs and discussed the procedure including the risks, benefits and alternatives for the proposed anesthesia with the patient or authorized representative who has indicated his/her understanding and acceptance.   Dental advisory given  Plan Discussed with: CRNA and Surgeon  Anesthesia Plan Comments:         Anesthesia Quick Evaluation

## 2012-07-04 NOTE — Brief Op Note (Signed)
07/04/2012  2:49 PM  PATIENT:  Anthony Holder  71 y.o. male  PRE-OPERATIVE DIAGNOSIS:  left foot non-healing diabetic ulcers  POST-OPERATIVE DIAGNOSIS:  left foot non-healing diabetic ulcers   Procedure(s): Left below knee amputation  SURGEON:  Toni Arthurs, MD  ASSISTANT: n/a  ANESTHESIA:   General  EBL:  minimal   TOURNIQUET:   Total Tourniquet Time Documented: Thigh (Left) - 47 minutes  COMPLICATIONS:  None apparent  DISPOSITION:  Extubated, awake and stable to recovery.  DICTATION ID:  [id number]

## 2012-07-04 NOTE — Preoperative (Signed)
Beta Blockers   Reason not to administer Beta Blockers:Not Applicable, took atenolol this am 

## 2012-07-04 NOTE — Plan of Care (Signed)
Problem: Consults Goal: Lower Extremity Amputation Patient Education See Patient Education Module for education specifics. Left BKA
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
## 2012-07-04 NOTE — Anesthesia Procedure Notes (Signed)
Procedure Name: Intubation Date/Time: 07/04/2012 1:30 PM Performed by: Margaree Mackintosh Pre-anesthesia Checklist: Patient identified, Timeout performed, Emergency Drugs available, Suction available and Patient being monitored Patient Re-evaluated:Patient Re-evaluated prior to inductionOxygen Delivery Method: Circle system utilized Preoxygenation: Pre-oxygenation with 100% oxygen Intubation Type: IV induction Ventilation: Mask ventilation without difficulty and Oral airway inserted - appropriate to patient size Laryngoscope Size: Mac and 3 Grade View: Grade I Tube type: Oral Tube size: 7.5 mm Number of attempts: 1 Airway Equipment and Method: Stylet Placement Confirmation: ETT inserted through vocal cords under direct vision,  positive ETCO2 and breath sounds checked- equal and bilateral Secured at: 22 cm Tube secured with: Tape Dental Injury: Teeth and Oropharynx as per pre-operative assessment

## 2012-07-04 NOTE — Progress Notes (Signed)
[facility]  NAME:  Anthony Holder, Anthony Holder NO.:  [id number]  MEDICAL RECORD NO.:  [id number]      DATE OF BIRTH:  01-10-1941  PHYSICIAN:  Wayland Denis, DO       VISIT DATE:  07/03/2012                                  OFFICE VISIT   The patient is a 71 year old gentleman who is here for followup on his left lower extremity diabetic foot ulcers.  He underwent irrigation, debridement, placement of ACell several times with the VAC.  Overall, he is decompensating and creating more wounds on the plantar, posterior, distal, and anterior part of his foot or dorsal part.  This is very concerning for lack of ability to heal and I will talk with Dr. Victorino Dike about it.  In the meantime, there is no change in his social history or medications, and we will see him back in a week.  We will use silver alginate on the area this week and encouraged elevation.     Wayland Denis, DO     CS/MEDQ  D:  07/03/2012  T:  07/04/2012  Job:  [id number]

## 2012-07-05 LAB — GLUCOSE, CAPILLARY
Glucose-Capillary: 147 mg/dL — ABNORMAL HIGH (ref 70–99)
Glucose-Capillary: 74 mg/dL (ref 70–99)
Glucose-Capillary: 186 mg/dL — ABNORMAL HIGH (ref 70–99)
Glucose-Capillary: 173 mg/dL — ABNORMAL HIGH (ref 70–99)

## 2012-07-05 MED ORDER — ADULT MULTIVITAMIN W/MINERALS CH
1.0000 | ORAL_TABLET | Freq: Every day | ORAL | Status: DC
  Filled 2012-07-05 (×2): qty 1

## 2012-07-05 MED ORDER — ENSURE COMPLETE PO LIQD: 237.0000 mL | Freq: Two times a day (BID) | ORAL | Status: DC

## 2012-07-05 NOTE — Care Management Note (Signed)
    Page 1 of 2   07/05/2012     3:25:56 PM   CARE MANAGEMENT NOTE 07/05/2012  Patient:  Anthony Holder, Anthony Holder   Account Number:  [id number]  Date Initiated:  07/05/2012  Documentation initiated by:  Anette Guarneri  Subjective/Objective Assessment:   POD#1s/p left BKA  will need HHPT/OT     Action/Plan:   home with [facility] services   Anticipated DC Date:  07/07/2012   Anticipated DC Plan:  HOME W HOME HEALTH SERVICES      DC Planning Services  CM consult      [facility] Choice  HOME HEALTH   Choice offered to / List presented to:  C-1 Patient   DME arranged  WHEELCHAIR - MANUAL      DME agency  [facility]     HH arranged  HH-2 PT  HH-3 OT      [facility] agency  [facility]   Status of service:  In process, will continue to follow Medicare Important Message given?   (If response is "NO", the following Medicare IM given date fields will be blank) Date Medicare IM given:   Date Additional Medicare IM given:    Discharge Disposition:    Per UR Regulation:  Reviewed for med. necessity/level of care/duration of stay  If discussed at Long Length of Stay Meetings, dates discussed:    Comments:  07/05/12  11:26 Anette Guarneri RN/CM Spoke with patient and wife regarding d/c planning Per patient and wife, plan is for d/c home, wife available 24/7, per patient , he has RW at home, W/C recommended by OT. Will need HHPT/OT, patient choice for [facility] services and DME is [facility]. This CM will continue to follow for additional needs.

## 2012-07-05 NOTE — Op Note (Signed)
NAMEHADLEY, Anthony Holder NO.:  [id number]  MEDICAL RECORD NO.:  [id number]  LOCATION:  [id number]                        FACILITY:  [facility]  PHYSICIAN:  Toni Arthurs, MD        DATE OF BIRTH:  05/23/1941  DATE OF PROCEDURE:  07/04/2012 DATE OF DISCHARGE:                              OPERATIVE REPORT   PREOPERATIVE DIAGNOSIS:  Left foot nonhealing diabetic foot wound with new onset of multiple diabetic ulcers.  POSTOP DIAGNOSIS:  Left foot nonhealing diabetic foot wound with new onset of multiple diabetic ulcers.  PROCEDURE:  Left below-knee amputation.  SURGEON: Toni Arthurs, MD.  ANESTHESIA:  General.  ESTIMATED BLOOD LOSS:  Minimal.  TOURNIQUET TIME:  47 minutes at 225 mmHg.  COMPLICATIONS:  None apparent.  DISPOSITION:  Extubated, awakened and stable to recovery.  INDICATIONS FOR PROCEDURE:  The patient is a 71 year old male with past medical history significant for diabetes.  He presented with a gangrenous left second toe approximately 2 and half months ago.  He underwent amputation of that toe and then developed progressive gangrene of the forefoot.  He has undergone multiple I and D's with applications of Acell and wound VAC dressings.  He has been treated extensively by Dr. Kelly Splinter at the [facility].  He has had progression now with new onset of ulcers on the dorsum of the foot, and the posterior aspect of the heel that tracked down to level of bone.  He has made absolutely no progress towards healing despite the aforementioned heroic efforts.  He presents now for left below-knee amputation.  He understands the risks and benefits of the alternative treatment options and elects surgical treatment.  He specifically understands risks of bleeding, infection, nerve damage, blood clots, need for additional surgery, revision amputation, and death.  PROCEDURE IN DETAIL:  After preoperative consent was obtained, the correct operative site was  identified.  The patient was brought to the operating room, placed supine on the operating table.  General anesthesia was induced.  Preoperative antibiotics were administered. Surgical time-out was taken.  Left lower extremity was prepped and draped in standard sterile fashion.  Tourniquet around the thigh.  The extremity was exsanguinated.  Tourniquet was inflated to 225 mmHg.  A posterior flap type incision was marked on the leg.  The tibial cut was marked 15 cm distal from the medial joint line at the knee.  The incision was made.  Sharp dissection was carried down through the skin and subcutaneous tissue to the level of the tibia.  The periosteum was incised and elevated proximally.  An oscillating saw was used to cut the tibia about 1 cm proximal from the skin incision.  The fibula was then dissected free and cut again with the oscillating saw, about 2 cm proximal from the cut of the tibia.  The amputation knife was then used to cut along the posterior flap from the posterior aspect of the tibia and fibula to the level of the skin distally.  The amputated extremity was then passed off the field as a specimen to pathology.  The named nerves were all placed on traction and cut sharply and allowed to retract into the  soft tissue.  All of the vascular bundles were doubly suture ligated with 2-0 silk suture.  The wound was irrigated copiously. The cut edge of the tibia was smoothed with a rasp.  Again, the wound was irrigated.  The gastrocnemius fascia was repaired to the anterior periosteum of the tibia using imbricating sutures of 0 PDS.  The posterior fascia was then repaired to the anterior fascia using 0 PDS figure-of-eight sutures.  Subcutaneous tissue was closed with inverted simple sutures of 3-0 Monocryl and a running 2-0 nylon was used to close the skin incision.  Sterile dressings were applied followed by a compression wrap and a knee immobilizer.  The patient was awakened  by Anesthesia, and transported to the recovery room in stable condition.  FOLLOWUP PLAN:  The patient will be admitted to the inpatient ward for Physical Therapy and Occupational Therapy.  He will hopefully be discharged home but may need a short period in acute rehab.     Toni Arthurs, MD     JH/MEDQ  D:  07/04/2012  T:  07/05/2012  Job:  [id number]

## 2012-07-05 NOTE — Progress Notes (Addendum)
Occupational Therapy Evaluation Patient Details Name: Anthony Holder MRN: [id number] DOB: [birth year] Today's Date: 07/05/2012 Time:  - 1000- 1045 45 min    OT Assessment / Plan / Recommendation Clinical Impression  71 yo s/p L BKA. Pt wants to go home. Wife is able to provide 24/7 S and will have grand daughter to assist for a few weeks after D/C. If pt progresses well over the next couple of days, he should be appropriate for [facility] services. If progress is slower, he may benefit from a short CIR stay. Will continue to assess. Pt will benefit from skilled OT acute services, secondary to deficits below, to max independence with ADL and functional mobility for ADL to facilitate D/C to next venue of care.    OT Assessment  Patient needs continued OT Services    Follow Up Recommendations  Home health OT;Inpatient Rehab;Other (comment) (If pt progresses slowly, he may benefit from CIR)    Barriers to Discharge None    Equipment Recommendations  Wheelchair (measurements);Wheelchair cushion (measurements)    Recommendations for Other Services  none  Frequency  Min 3X/week    Precautions / Restrictions Precautions Precautions: Fall Required Braces or Orthoses: Knee Immobilizer - Left (KI on L leg) Restrictions Weight Bearing Restrictions: Yes LLE Weight Bearing: Non weight bearing   Pertinent Vitals/Pain 4    ADL  Eating/Feeding: Simulated;Independent Grooming: Simulated;Set up Where Assessed - Grooming: Supported sitting Upper Body Bathing: Simulated;Set up Where Assessed - Upper Body Bathing: Supported sitting Lower Body Bathing: Simulated;Moderate assistance Where Assessed - Lower Body Bathing: Supported sit to stand Upper Body Dressing: Simulated;Set up Where Assessed - Upper Body Dressing: Supported sitting Lower Body Dressing: Simulated;Moderate assistance Where Assessed - Lower Body Dressing: Unsupported sitting;Rolling right and/or left Toilet Transfer: Performed;Moderate  assistance Toilet Transfer Method: Stand pivot Toilet Transfer Equipment: Bedside commode Toileting - Clothing Manipulation and Hygiene: Performed;Moderate assistance Where Assessed - Toileting Clothing Manipulation and Hygiene: Standing;Sit to stand from 3-in-1 or toilet Equipment Used: Gait belt;Knee Immobilizer;Rolling walker Transfers/Ambulation Related to ADLs: Min A sit - stand. Mod A toilet transfer. ADL Comments: A for LB ADL. Will benefit from DME and AE.    OT Diagnosis: Generalized weakness;Acute pain  OT Problem List: Decreased strength;Decreased activity tolerance;Decreased range of motion;Decreased safety awareness;Decreased knowledge of use of DME or AE;Decreased knowledge of precautions;Impaired sensation;Pain OT Treatment Interventions: Self-care/ADL training;Therapeutic exercise;Energy conservation;DME and/or AE instruction;Therapeutic activities;Patient/family education;Balance training   OT Goals Acute Rehab OT Goals OT Goal Formulation: With patient Time For Goal Achievement: 07/12/12 Potential to Achieve Goals: Good ADL Goals Pt Will Perform Grooming: with supervision;Standing at sink;with caregiver independent in assisting;Supported ADL Goal: Grooming - Progress: Goal set today Pt Will Perform Lower Body Bathing: with supervision;with caregiver independent in assisting;with cueing (comment type and amount);Supported;Standing at sink;Sit to stand from chair ADL Goal: Lower Body Bathing - Progress: Goal set today Pt Will Perform Lower Body Dressing: with supervision;with caregiver independent in assisting;Supported;Sit to stand from chair;with cueing (comment type and amount) ADL Goal: Lower Body Dressing - Progress: Goal set today Pt Will Transfer to Toilet: with supervision;with caregiver independent in assisting;Ambulation;with DME;3-in-1;Maintaining weight bearing status ADL Goal: Toilet Transfer - Progress: Goal set today Pt Will Perform Toileting - Clothing  Manipulation: with supervision;with caregiver independent in assisting;Sitting on 3-in-1 or toilet;with cueing (comment type and amount) ADL Goal: Toileting - Clothing Manipulation - Progress: Goal set today Pt Will Perform Toileting - Hygiene: with modified independence;Sitting on 3-in-1 or toilet;Leaning right and/or left on 3-in-1/toilet ADL  Goal: Toileting - Hygiene - Progress: Goal set today Pt Will Perform Tub/Shower Transfer: with min assist;with caregiver independent in assisting;Ambulation;Shower transfer;with DME;Maintaining weight bearing status;Other (comment) (3 in 1) ADL Goal: Tub/Shower Transfer - Progress: Goal set today Additional ADL Goal #1: Pt will b e independent with desensitization of LLE with written handout. ADL Goal: Additional Goal #1 - Progress: Goal set today Additional ADL Goal #2: Pt will verbalize understanding of compression wrapping and complete compression wrapping with Min A of caregiver in preparation for prosthesis. ADL Goal: Additional Goal #2 - Progress: Goal set today  Visit Information  Last OT Received On: 07/05/12 Assistance Needed: +1    Subjective Data      Prior Functioning  Vision/Perception  Home Living Lives With: Spouse;Family Engineer, building services) Available Help at Discharge: Family Type of Home: Mobile home Home Access: Stairs to enter Entrance Stairs-Number of Steps: 1 Entrance Stairs-Rails: None Home Layout: One level Bathroom Shower/Tub: Walk-in shower;Door [employer] Toilet: Standard Bathroom Accessibility: Yes How Accessible: Accessible via walker Home Adaptive Equipment: Walker - rolling;Bedside commode/3-in-1 Additional Comments: Family is building a ramp into the home Prior Function Level of Independence: Independent with assistive device(s) Able to Take Stairs?: Yes Driving: No Vocation: Retired Musician: No difficulties Dominant Hand: Right      Cognition  Overall Cognitive Status: Appears within  functional limits for tasks assessed/performed Arousal/Alertness: Awake/alert Orientation Level: Oriented X4 / Intact Behavior During Session: WFL for tasks performed    Extremity/Trunk Assessment Right Upper Extremity Assessment RUE ROM/Strength/Tone: WFL for tasks assessed RUE Sensation: WFL - Light Touch;WFL - Proprioception RUE Coordination: WFL - gross motor Left Upper Extremity Assessment LUE ROM/Strength/Tone: WFL for tasks assessed LUE Coordination: WFL - gross/fine motor;WFL - gross motor Trunk Assessment Trunk Assessment: Normal   Mobility Transfers Transfers: Sit to Stand;Stand to Sit Sit to Stand: 4: Min assist;From chair/3-in-1;With upper extremity assist Stand to Sit: 4: Min assist;To chair/3-in-1;With upper extremity assist Details for Transfer Assistance: vc for sequence and safety and use of DME   Exercise    Balance Standardized Balance Assessment Standardized Balance Assessment:  (Fair static)  End of Session OT - End of Session Equipment Utilized During Treatment: Gait belt;Left knee immobilizer Activity Tolerance: Patient tolerated treatment well Patient left: with nursing in room;Other (comment) (3 in 1) Nurse Communication: Mobility status  GO     Jiya Kissinger,HILLARY 07/05/2012, 10:52 AM Luisa Dago, OTR/L  [phone] 07/05/2012

## 2012-07-05 NOTE — Progress Notes (Signed)
Unable to void post-op. Arlyn Leak PA notified. Straight cath performed with 900cc clear, yellow urine returned. Tolerated well. Will continue to monitor.

## 2012-07-05 NOTE — Progress Notes (Signed)
UR COMPLETED  

## 2012-07-05 NOTE — Evaluation (Signed)
Physical Therapy Evaluation Patient Details Name: Anthony Holder MRN: [id number] DOB: 05-Dec-1941 Today's Date: 07/05/2012 Time: 4098-1191 PT Time Calculation (min): 18 min  PT Assessment / Plan / Recommendation Clinical Impression  Pt is a 71 y/o male s/p Left BKA.  Pt will benefit from acute PT to maximize mobility.      PT Assessment  Patient needs continued PT services    Follow Up Recommendations  Home health PT;Supervision - Intermittent    Barriers to Discharge None      Equipment Recommendations  Wheelchair (measurements);Wheelchair cushion (measurements)    Recommendations for Other Services     Frequency Min 6X/week    Precautions / Restrictions Precautions Precautions: Fall Required Braces or Orthoses: Knee Immobilizer - Left Knee Immobilizer - Left: Other (comment) (to assist with knee extension) Restrictions Weight Bearing Restrictions: Yes LLE Weight Bearing: Non weight bearing   Pertinent Vitals/Pain Pt reports pain 2/10 in L residual limb.  No intervention required per pt.       Mobility  Bed Mobility Bed Mobility: Supine to Sit;Sitting - Scoot to Edge of Bed Supine to Sit: 4: Min assist;HOB flat Details for Bed Mobility Assistance: cues for technique assist for L LE.  Transfers Transfers: Sit to Stand;Stand to [employer] Transfers Sit to Stand: 4: Min assist;From chair/3-in-1;With upper extremity assist Stand to Sit: 4: Min assist;To chair/3-in-1;With upper extremity assist Stand Pivot Transfers: 4: Min assist Details for Transfer Assistance: vc for sequence and safety and use of DME Ambulation/Gait Ambulation/Gait Assistance: Not tested (comment) Wheelchair Mobility Wheelchair Mobility: No    Exercises     PT Diagnosis: Difficulty walking;Acute pain;Generalized weakness  PT Problem List: Decreased mobility;Decreased knowledge of precautions;Decreased knowledge of use of DME;Pain PT Treatment Interventions: DME instruction;Gait  training;Functional mobility training;Wheelchair mobility training;Therapeutic activities;Therapeutic exercise;Patient/family education   PT Goals Acute Rehab PT Goals PT Goal Formulation: With patient Time For Goal Achievement: 07/12/12 Potential to Achieve Goals: Good Pt will go Supine/Side to Sit: with modified independence PT Goal: Supine/Side to Sit - Progress: Goal set today Pt will go Sit to Supine/Side: with modified independence PT Goal: Sit to Supine/Side - Progress: Goal set today Pt will Transfer Bed to Chair/Chair to Bed: with modified independence PT Transfer Goal: Bed to Chair/Chair to Bed - Progress: Goal set today Pt will Ambulate: 1 - 15 feet;with supervision;with standard walker PT Goal: Ambulate - Progress: Goal set today  Visit Information  Last PT Received On: 07/05/12 Assistance Needed: +1    Subjective Data  Subjective: I have not been able to urinate so I dont feel so good. Patient Stated Goal: Return to home as independent as possible.    Prior Functioning  Home Living Lives With: Spouse;Family Engineer, building services) Available Help at Discharge: Family Type of Home: Mobile home Home Access: Stairs to enter Entrance Stairs-Number of Steps: 1 Entrance Stairs-Rails: None Home Layout: One level Bathroom Shower/Tub: Walk-in shower;Door [employer] Toilet: Standard Bathroom Accessibility: Yes How Accessible: Accessible via walker Home Adaptive Equipment: Walker - rolling;Bedside commode/3-in-1 Additional Comments: Family is building a ramp into the home Prior Function Level of Independence: Independent with assistive device(s) Able to Take Stairs?: Yes Driving: No Vocation: Retired Musician: No difficulties Dominant Hand: Right    Cognition  Overall Cognitive Status: Appears within functional limits for tasks assessed/performed Arousal/Alertness: Awake/alert Orientation Level: Oriented X4 / Intact Behavior During Session: [facility] for tasks  performed    Extremity/Trunk Assessment Right Upper Extremity Assessment RUE ROM/Strength/Tone: [facility] for tasks assessed RUE Sensation: [facility] -  Light Touch;WFL - Proprioception RUE Coordination: WFL - gross motor Left Upper Extremity Assessment LUE ROM/Strength/Tone: WFL for tasks assessed LUE Coordination: WFL - gross/fine motor;WFL - gross motor Right Lower Extremity Assessment RLE ROM/Strength/Tone: WFL for tasks assessed;Within functional levels RLE Sensation: WFL - Proprioception RLE Coordination: WFL - gross motor Left Lower Extremity Assessment LLE ROM/Strength/Tone: Deficits;Due to pain Trunk Assessment Trunk Assessment: Normal   Balance Balance Balance Assessed: Yes Static Sitting Balance Static Sitting - Balance Support: No upper extremity supported Static Sitting - Level of Assistance: 7: Independent Static Sitting - Comment/# of Minutes: several minutes sitting on EOB with no LOB.  Standardized Balance Assessment Standardized Balance Assessment:  (Fair static)  End of Session PT - End of Session Equipment Utilized During Treatment: Gait belt Activity Tolerance: Patient tolerated treatment well Patient left: in chair;with call bell/phone within reach Nurse Communication: Mobility status  GP     [name] 07/05/2012, 12:36 PM  [name] L. Zadrian Mccauley DPT [phone]

## 2012-07-05 NOTE — Progress Notes (Signed)
Subjective: 1 Day Post-Op Procedure(s) (LRB): AMPUTATION BELOW KNEE (Left) Patient reports pain as mild.  Well controlled.  Urinating well.  No f/c/n/v.  Objective: Vital signs in last 24 hours: Temp:  [97.2 F (36.2 C)-97.8 F (36.6 C)] 97.8 F (36.6 C) (07/17 0605) Pulse Rate:  [66-79] 78  (07/17 0605) Resp:  [7-33] 18  (07/17 0605) BP: (127-155)/(56-72) 127/62 mmHg (07/17 0605) SpO2:  [96 %-100 %] 96 % (07/17 0605) Weight:  [84.82 kg (186 lb 15.9 oz)] 84.82 kg (186 lb 15.9 oz) (07/16 1652)  Intake/Output from previous day: 07/16 0701 - 07/17 0700 In: 650 [I.V.:650] Out: 25 [Blood:25] Intake/Output this shift: Total I/O In: -  Out: 300 [Urine:300]   Basename 07/04/12 1006  HGB 11.7*    Basename 07/04/12 1006  WBC 16.9*  RBC 4.35  HCT 34.7*  PLT 402*    Basename 07/04/12 1006  NA 135  K 5.2*  CL 99  CO2 25  BUN 22  CREATININE 1.00  GLUCOSE 163*  CALCIUM 10.2   No results found for this basename: LABPT:2,INR:2 in the last 72 hours  Pt appears well.  Stump dressed and dry.  Assessment/Plan: 1 Day Post-Op Procedure(s) (LRB): AMPUTATION BELOW KNEE (Left) Up with therapy  Hopefully home tomorrow if he continues to do well.  All abx stopped.  We'll check WBC tomorrow.  Toni Arthurs 07/05/2012, 12:21 PM
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
## 2012-07-05 NOTE — Progress Notes (Signed)
Occupational Therapy Treatment Patient Details Name: Anthony Holder MRN: [id number] DOB: 07-May-1941 Today's Date: 07/05/2012 Time: 4098-1191 OT Time Calculation (min): 24 min  OT Assessment / Plan / Recommendation Comments on Treatment Session Verbalized understanding of treatment. Will continue to assess most appropriate D/C plan. Very motivated to return to PLOF. Supportive family.    Follow Up Recommendations  Home health OT;Inpatient Rehab;Other (comment)    Barriers to Discharge  None    Equipment Recommendations  Wheelchair (measurements);Wheelchair cushion (measurements)    Recommendations for Other Services    Frequency Min 3X/week   Plan Discharge plan remains appropriate    Precautions / Restrictions Precautions Precautions: Fall Required Braces or Orthoses: Knee Immobilizer - Left Knee Immobilizer - Left: Other (comment) (to assist with knee extension) Restrictions Weight Bearing Restrictions: Yes LLE Weight Bearing: Non weight bearing   Pertinent Vitals/Pain 4    ADL  Eating/Feeding: Simulated;Independent Grooming: Simulated;Set up Where Assessed - Grooming: Supported sitting Upper Body Bathing: Simulated;Set up Where Assessed - Upper Body Bathing: Supported sitting Lower Body Bathing: Simulated;Moderate assistance Where Assessed - Lower Body Bathing: Supported sit to stand Upper Body Dressing: Simulated;Set up Where Assessed - Upper Body Dressing: Supported sitting Lower Body Dressing: Simulated;Moderate assistance Where Assessed - Lower Body Dressing: Unsupported sitting;Rolling right and/or left Toilet Transfer: Performed;Moderate assistance Toilet Transfer Method: Stand pivot Toilet Transfer Equipment: Bedside commode Toileting - Clothing Manipulation and Hygiene: Performed;Moderate assistance Where Assessed - Toileting Clothing Manipulation and Hygiene: Standing;Sit to stand from 3-in-1 or toilet Equipment Used: Gait belt;Knee Immobilizer;Rolling  walker Transfers/Ambulation Related to ADLs: Min A sit - stand. Mod A toilet transfer. ADL Comments: Educated pt in importance of keeping LLE elevated and began educating pt about care for residual limb in preparation for his prosthesis. Discussed desensitization, elevation, importance of knee extension and compression wrapping. Pt also given written handout on ramp construction.    OT Diagnosis: Generalized weakness;Acute pain  OT Problem List: Decreased strength;Decreased activity tolerance;Decreased range of motion;Decreased safety awareness;Decreased knowledge of use of DME or AE;Decreased knowledge of precautions;Impaired sensation;Pain OT Treatment Interventions: Self-care/ADL training;Therapeutic exercise;Energy conservation;DME and/or AE instruction;Therapeutic activities;Patient/family education;Balance training   OT Goals Acute Rehab OT Goals OT Goal Formulation: With patient Time For Goal Achievement: 07/12/12 Potential to Achieve Goals: Good ADL Goals Pt Will Perform Grooming: with supervision;Standing at sink;with caregiver independent in assisting;Supported ADL Goal: Grooming - Progress: Goal set today Pt Will Perform Lower Body Bathing: with supervision;with caregiver independent in assisting;with cueing (comment type and amount);Supported;Standing at sink;Sit to stand from chair ADL Goal: Lower Body Bathing - Progress: Goal set today Pt Will Perform Lower Body Dressing: with supervision;with caregiver independent in assisting;Supported;Sit to stand from chair;with cueing (comment type and amount) ADL Goal: Lower Body Dressing - Progress: Goal set today Pt Will Transfer to Toilet: with min assist;Stand pivot transfer;with DME;3-in-1;Maintaining weight bearing status ADL Goal: Toilet Transfer - Progress: Progressing toward goals Pt Will Perform Toileting - Clothing Manipulation: with supervision;with caregiver independent in assisting;Sitting on 3-in-1 or toilet;with cueing  (comment type and amount) ADL Goal: Toileting - Clothing Manipulation - Progress: Progressing toward goals Pt Will Perform Toileting - Hygiene: with modified independence;Sitting on 3-in-1 or toilet;Leaning right and/or left on 3-in-1/toilet ADL Goal: Toileting - Hygiene - Progress: Progressing toward goals Pt Will Perform Tub/Shower Transfer: with min assist;with caregiver independent in assisting;Ambulation;Shower transfer;with DME;Maintaining weight bearing status;Other (comment) (3 in 1) ADL Goal: Tub/Shower Transfer - Progress: Goal set today Additional ADL Goal #1: Pt will b e independent with desensitization  of LLE with written handout. ADL Goal: Additional Goal #1 - Progress: Progressing toward goals Additional ADL Goal #2: Pt will verbalize understanding of compression wrapping and complete compression wrapping with Min A of caregiver in preparation for prosthesis. ADL Goal: Additional Goal #2 - Progress: Progressing toward goals  Visit Information  Last OT Received On: 07/05/12 Assistance Needed: +1    Subjective Data      Prior Functioning  Home Living Lives With: Spouse;Family Engineer, building services) Available Help at Discharge: Family Type of Home: Mobile home Home Access: Stairs to enter Entrance Stairs-Number of Steps: 1 Entrance Stairs-Rails: None Home Layout: One level Bathroom Shower/Tub: Walk-in shower;Door [employer] Toilet: Standard Bathroom Accessibility: Yes How Accessible: Accessible via walker Home Adaptive Equipment: Walker - rolling;Bedside commode/3-in-1 Additional Comments: Family is building a ramp into the home Prior Function Level of Independence: Independent with assistive device(s) Able to Take Stairs?: Yes Driving: No Vocation: Retired Musician: No difficulties Dominant Hand: Right    Cognition  Overall Cognitive Status: Appears within functional limits for tasks assessed/performed Arousal/Alertness: Awake/alert Orientation  Level: Oriented X4 / Intact Behavior During Session: [facility] for tasks performed    Mobility Transfers Transfers: Stand to Sit;Sit to Stand Sit to Stand: 4: Min assist;From chair/3-in-1;With upper extremity assist Stand to Sit: 4: Min assist;To chair/3-in-1;With upper extremity assist Details for Transfer Assistance: vc for sequence and safety and use of DME   Exercises    Balance Standardized Balance Assessment Standardized Balance Assessment:  (Fair static)  End of Session OT - End of Session Equipment Utilized During Treatment: Gait belt Activity Tolerance: Patient tolerated treatment well Patient left: in chair;with call bell/phone within reach Nurse Communication: Mobility status  GO     Tige Meas,HILLARY 07/05/2012, 11:32 AM Luisa Dago, OTR/L  [phone] 07/05/2012

## 2012-07-06 ENCOUNTER — Encounter (HOSPITAL_COMMUNITY): Payer: Self-pay | Admitting: Orthopedic Surgery

## 2012-07-06 LAB — CBC
HCT: 30.8 % — ABNORMAL LOW (ref 39.0–52.0)
MCHC: 32.1 g/dL (ref 30.0–36.0)
Platelets: 317 10*3/uL (ref 150–400)
RDW: 14.7 % (ref 11.5–15.5)
WBC: 11.2 10*3/uL — ABNORMAL HIGH (ref 4.0–10.5)

## 2012-07-06 LAB — GLUCOSE, CAPILLARY: Glucose-Capillary: 184 mg/dL — ABNORMAL HIGH (ref 70–99)

## 2012-07-06 MED ORDER — OXYCODONE HCL 5 MG PO TABS: 5.0000 mg | ORAL_TABLET | ORAL | Status: AC | PRN

## 2012-07-06 MED ORDER — DSS 100 MG PO CAPS: 100.0000 mg | ORAL_CAPSULE | Freq: Two times a day (BID) | ORAL | Status: AC

## 2012-07-06 MED ORDER — SENNA 8.6 MG PO TABS: 1.0000 | ORAL_TABLET | Freq: Two times a day (BID) | ORAL | Status: DC

## 2012-07-06 NOTE — Progress Notes (Signed)
Needs a wheelchair due to new BKA and for mobility and traveling far distances

## 2012-07-06 NOTE — Discharge Summary (Signed)
Physician Discharge Summary  Patient ID: Anthony Holder MRN: [id number] DOB/AGE: [birth year] 71 y.o.  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  He did well with PT and OT and is discharged to home today in stable condition.  Consults: None  Significant Diagnostic Studies: none  Treatments: surgery: L BKA  Discharge Exam: Blood pressure 135/70, pulse 85, temperature 97.8 F (36.6 C), temperature source Oral, resp. rate 18, height 5\' 10"  (1.778 m), weight 84.82 kg (186 lb 15.9 oz), SpO2 99.00%. L BKA stump dressed and dry.  O/w WN WD male in nad.  A anc O x 4.  moos and affect normal.  respirations unlabored.  Disposition: 01-Home or Self Care  Discharge Orders    Future Appointments: Provider: Department: Dept Phone: Center:   07/24/2012 8:00 AM [facility] [phone]   07/24/2012 3:30 PM Cliffton Asters, MD [facility] [phone] [facility]   07/25/2012 1:30 PM [facility] [phone] [facility]   08/15/2012 11:30 AM Rollene Rotunda, MD [facility] [phone] [facility]     Future Orders Please Complete By Expires   Diet - low sodium heart healthy      Call MD / Call 911      Comments:   If you experience chest pain or shortness of breath, CALL 911 and be transported to the hospital emergency room.  If you develope a fever above 101 F, pus (white drainage) or increased drainage or redness at the wound, or calf pain, call your surgeon's office.   Constipation Prevention      Comments:   Drink plenty of fluids.  Prune juice may be helpful.  You may use a stool softener, such as Colace (over the counter) 100 mg twice a day.  Use MiraLax (over the  counter) for constipation as needed.   Increase activity slowly as tolerated        Medication List  As of 07/06/2012  7:46 AM   STOP taking these medications         amoxicillin-clavulanate 875-125 MG per tablet      sulfamethoxazole-trimethoprim 800-160 MG per tablet      traMADol 50 MG tablet         TAKE these medications         aspirin EC 81 MG tablet   Take 81 mg by mouth daily.      atenolol 50 MG tablet   Commonly known as: TENORMIN   Take 25 mg by mouth daily. Patient takes 1/2 tablet      colesevelam 625 MG tablet   Commonly known as: WELCHOL   Take 1,875 mg by mouth 2 (two) times daily.      DSS 100 MG Caps   Take 100 mg by mouth 2 (two) times daily.      fluticasone 50 MCG/ACT nasal spray   Commonly known as: FLONASE   Place 2 sprays into the nose daily as needed. As needed for allergy congestion      furosemide 40 MG tablet   Commonly known as: LASIX   Take 40 mg by mouth 2 (two) times daily.      gabapentin 300 MG capsule   Commonly known as:  NEURONTIN   Take 1 capsule (300 mg total) by mouth 3 (three) times daily.      lactobacillus acidophilus Tabs   Take 2 tablets by mouth 3 (three) times daily as needed. For constipation      LANTUS SOLOSTAR 100 UNIT/ML injection   Generic drug: insulin glargine   Inject 34 Units into the skin at bedtime.      MAG OXIDE-VIT D3-TURMERIC PO   Take 2 capsules by mouth 2 (two) times daily.      metFORMIN 500 MG 24 hr tablet   Commonly known as: GLUCOPHAGE-XR   Take 1,000 mg by mouth 2 (two) times daily.      ofloxacin 0.3 % otic solution   Commonly known as: FLOXIN      oxyCODONE 5 MG immediate release tablet   Commonly known as: Oxy IR/ROXICODONE   Take 1-2 tablets (5-10 mg total) by mouth every 3 (three) hours as needed.      potassium chloride SA 20 MEQ tablet   Commonly known as: K-DUR,KLOR-CON   Take 20 mEq by mouth every other day.      senna 8.6 MG Tabs   Commonly known as: SENOKOT   Take 1  tablet (8.6 mg total) by mouth 2 (two) times daily.      vitamin B-12 500 MCG tablet   Commonly known as: CYANOCOBALAMIN   Take 500 mcg by mouth 2 (two) times daily.      vitamin C 500 MG tablet   Commonly known as: ASCORBIC ACID   Take 500 mg by mouth 3 (three) times daily.           Follow-up Information    Follow up with Zenda Herskowitz, Jonny Ruiz, MD. Schedule an appointment as soon as possible for a visit in 10 days.   Contact information:   [address], [address] [place] [address] [phone]          Signed: Toni Arthurs 07/06/2012, 7:46 AM

## 2012-07-07 NOTE — Progress Notes (Signed)
PT Cancellation Note  Treatment cancelled today due to pt discharged from hospital.  .  Anthony Holder 07/07/2012, 12:27 PM

## 2012-07-24 ENCOUNTER — Ambulatory Visit: Payer: Medicare Other | Admitting: Internal Medicine

## 2012-07-24 ENCOUNTER — Encounter (HOSPITAL_BASED_OUTPATIENT_CLINIC_OR_DEPARTMENT_OTHER): Payer: Medicare Other | Attending: Plastic Surgery

## 2012-07-24 ENCOUNTER — Encounter: Payer: Self-pay | Admitting: Neurosurgery

## 2012-07-24 DIAGNOSIS — L97509 Non-pressure chronic ulcer of other part of unspecified foot with unspecified severity: Secondary | ICD-10-CM | POA: Insufficient documentation

## 2012-07-25 ENCOUNTER — Other Ambulatory Visit: Payer: Medicare Other

## 2012-07-25 ENCOUNTER — Ambulatory Visit: Payer: Medicare Other | Admitting: Neurosurgery

## 2012-08-15 ENCOUNTER — Institutional Professional Consult (permissible substitution): Payer: Medicare Other | Admitting: Cardiology

## 2012-09-04 ENCOUNTER — Encounter (HOSPITAL_BASED_OUTPATIENT_CLINIC_OR_DEPARTMENT_OTHER): Payer: Medicare Other | Attending: General Surgery

## 2012-09-04 DIAGNOSIS — L97809 Non-pressure chronic ulcer of other part of unspecified lower leg with unspecified severity: Secondary | ICD-10-CM | POA: Insufficient documentation

## 2012-09-04 DIAGNOSIS — Y835 Amputation of limb(s) as the cause of abnormal reaction of the patient, or of later complication, without mention of misadventure at the time of the procedure: Secondary | ICD-10-CM | POA: Insufficient documentation

## 2012-09-04 DIAGNOSIS — T8189XA Other complications of procedures, not elsewhere classified, initial encounter: Secondary | ICD-10-CM | POA: Insufficient documentation

## 2012-09-04 NOTE — Progress Notes (Signed)
[facility]  NAME:  Anthony Holder, Anthony Holder NO.:  [id number]  MEDICAL RECORD NO.:  [id number]      DATE OF BIRTH:  17-Jun-1941  PHYSICIAN:  Ardath Sax, M.D.           VISIT DATE:                                  OFFICE VISIT   This is a 71 year old gentleman who has undergone a left BK amputation and he has wound on the medial side of the incision which has not heal. It is about half a centimeter deep and 1 cm long.  It had some necrotic skin and fat which I debrided and we are treating it now with a collagen and Santyl.  We will have him change it every day and come back in a week.  He is, of course, a diabetic and he is an old patient here.  We took care of for a long time trying to heal his diabetic foot ulcers and it ended up having multiple amputations, so last being amputation of his leg below the knee.  He does not have any infection.  I think this should clear up.  We will keep using the Santyl and collagen and then maybe go to Endoform.  His diabetes is in good shape.  He is afebrile and his vital signs are normal and he is not having any problems with his right leg at this time and he tells [place] that his sugars are staying in the range of 120.  He will come back in a week.     Ardath Sax, M.D.     PP/MEDQ  D:  09/04/2012  T:  09/04/2012  Job:  [id number]

## 2012-09-25 ENCOUNTER — Encounter (HOSPITAL_BASED_OUTPATIENT_CLINIC_OR_DEPARTMENT_OTHER): Payer: Medicare Other | Attending: General Surgery

## 2012-09-25 DIAGNOSIS — T85698A Other mechanical complication of other specified internal prosthetic devices, implants and grafts, initial encounter: Secondary | ICD-10-CM | POA: Insufficient documentation

## 2012-09-25 DIAGNOSIS — L97809 Non-pressure chronic ulcer of other part of unspecified lower leg with unspecified severity: Secondary | ICD-10-CM | POA: Insufficient documentation

## 2012-09-25 DIAGNOSIS — Y835 Amputation of limb(s) as the cause of abnormal reaction of the patient, or of later complication, without mention of misadventure at the time of the procedure: Secondary | ICD-10-CM | POA: Insufficient documentation

## 2012-10-09 ENCOUNTER — Encounter (HOSPITAL_BASED_OUTPATIENT_CLINIC_OR_DEPARTMENT_OTHER): Payer: Medicare Other

## 2012-10-09 NOTE — Progress Notes (Signed)
[facility]  NAME:  BLEASE, ZEMAITIS NO.:  [id number]  MEDICAL RECORD NO.:  [id number]      DATE OF BIRTH:  01-Sep-1941  PHYSICIAN:  Wayland Denis, DO            VISIT DATE:                                  OFFICE VISIT   The patient is here for a followup on his left lower extremity stump. He had foot ulcer that did not respond to local treatment and kept getting worse.  The decision was made for a below-knee amputation.  He was doing well until he fell and the incision opened up on the medial aspect.  We did get it closed up at 1 point but it seems to be red, tender today and opening up some more.  We obtained cultures and we will get him started on doxycycline and plan to go to the OR for revision excision and closure.     Wayland Denis, DO     CS/MEDQ  D:  10/09/2012  T:  10/09/2012  Job:  [id number]

## 2012-10-17 NOTE — Progress Notes (Signed)
[facility]  NAME:  Anthony Holder, Anthony Holder NO.:  [id number]  MEDICAL RECORD NO.:  [id number]      DATE OF BIRTH:  05-29-1941  PHYSICIAN:  Wayland Denis, DO            VISIT DATE:                                  OFFICE VISIT   The patient is a 71 year old gentleman, who is here for followup on his left amputation site on the medial aspect.  He has a ulceration that is opened up.  It healed up once and has opened up again.  It has a little bit of excess skin there any way, so it needs to be debrided.  He is not showing any signs of infection.  It is not red.  There is no excess drainage.  The medications have not changed.  SOCIAL HISTORY:  Unchanged.  PHYSICAL EXAMINATION:  GENERAL:  He is alert, oriented, cooperative, not in any acute distress.  He is pleasant. HEENT:  Pupils are equal.  Extraocular muscles are intact. LUNGS:  No breathing difficulties.  Pulses are strong and regular in the upper extremity.  We will continue with Silvadene which he has been getting over the last week and we are working on getting him to the operating room for excision.     Wayland Denis, DO     CS/MEDQ  D:  10/16/2012  T:  10/17/2012  Job:  [id number]

## 2012-10-18 ENCOUNTER — Encounter (HOSPITAL_BASED_OUTPATIENT_CLINIC_OR_DEPARTMENT_OTHER): Payer: Self-pay | Admitting: *Deleted

## 2012-10-19 ENCOUNTER — Encounter (HOSPITAL_BASED_OUTPATIENT_CLINIC_OR_DEPARTMENT_OTHER): Payer: Self-pay | Admitting: *Deleted

## 2012-10-19 NOTE — Progress Notes (Signed)
NPO AFTER MN. ARRIVES AT 0615. NEEDS ISTAT. CURRENT EKG AND CXR IN EPIC AND CHART. PRE-OP ORDERS PENDING. CALLED OFFICE THIS PROCEDURE IS PRIMARY WOUND CLOSURE OF LEFT BKA, NOT DOING I & D.  PT WILL TAKE ATENOLOL AM OF SURG W/ SIP OF WATER.

## 2012-10-22 NOTE — Anesthesia Preprocedure Evaluation (Addendum)
Anesthesia Evaluation  Patient identified by MRN, date of birth, ID band Patient awake    Reviewed: Allergy & Precautions, H&P , NPO status , Patient's Chart, lab work & pertinent test results  Airway Mallampati: II TM Distance: >3 FB Neck ROM: Full    Dental No notable dental hx.    Pulmonary neg pulmonary ROS,  breath sounds clear to auscultation  Pulmonary exam normal       Cardiovascular hypertension, Pt. on home beta blockers and Pt. on medications + CAD, + Peripheral Vascular Disease and +CHF negative cardio ROS  + Valvular Problems/Murmurs Rhythm:Regular Rate:Normal     Neuro/Psych Stroke February 2013 with residual numbness left arm and side. CVA, Residual Symptoms negative psych ROS   GI/Hepatic negative GI ROS, Neg liver ROS,   Endo/Other  diabetes, Type 2, Insulin Dependent and Oral Hypoglycemic Agents  Renal/GU negative Renal ROS  negative genitourinary   Musculoskeletal negative musculoskeletal ROS (+)   Abdominal   Peds negative pediatric ROS (+)  Hematology negative hematology ROS (+)   Anesthesia Other Findings   Reproductive/Obstetrics negative OB ROS                         Anesthesia Physical Anesthesia Plan  ASA: III  Anesthesia Plan: MAC   Post-op Pain Management:    Induction: Intravenous  Airway Management Planned:   Additional Equipment:   Intra-op Plan:   Post-operative Plan: Extubation in OR  Informed Consent: I have reviewed the patients History and Physical, chart, labs and discussed the procedure including the risks, benefits and alternatives for the proposed anesthesia with the patient or authorized representative who has indicated his/her understanding and acceptance.   Dental advisory given  Plan Discussed with: CRNA  Anesthesia Plan Comments: (LMA 4 in past with success.)       Anesthesia Quick Evaluation

## 2012-10-23 ENCOUNTER — Encounter (HOSPITAL_BASED_OUTPATIENT_CLINIC_OR_DEPARTMENT_OTHER)
Admission: RE | Disposition: A | Discharge status: Home / Self Care | Payer: Self-pay | Source: Ambulatory Visit | Attending: Plastic Surgery

## 2012-10-23 ENCOUNTER — Encounter (HOSPITAL_BASED_OUTPATIENT_CLINIC_OR_DEPARTMENT_OTHER): Payer: Self-pay | Admitting: Anesthesiology

## 2012-10-23 ENCOUNTER — Ambulatory Visit (HOSPITAL_BASED_OUTPATIENT_CLINIC_OR_DEPARTMENT_OTHER)
Admission: RE | Admit: 2012-10-23 | Discharge: 2012-10-23 | Disposition: A | Discharge status: Home / Self Care | Payer: Medicare Other | Source: Ambulatory Visit | Attending: Plastic Surgery | Admitting: Plastic Surgery

## 2012-10-23 ENCOUNTER — Encounter (HOSPITAL_BASED_OUTPATIENT_CLINIC_OR_DEPARTMENT_OTHER): Payer: Self-pay | Admitting: Plastic Surgery

## 2012-10-23 ENCOUNTER — Encounter (HOSPITAL_BASED_OUTPATIENT_CLINIC_OR_DEPARTMENT_OTHER): Payer: Medicare Other | Attending: Plastic Surgery

## 2012-10-23 ENCOUNTER — Ambulatory Visit (HOSPITAL_BASED_OUTPATIENT_CLINIC_OR_DEPARTMENT_OTHER): Payer: Medicare Other | Admitting: Anesthesiology

## 2012-10-23 ENCOUNTER — Encounter (HOSPITAL_BASED_OUTPATIENT_CLINIC_OR_DEPARTMENT_OTHER): Payer: Self-pay

## 2012-10-23 DIAGNOSIS — E7800 Pure hypercholesterolemia, unspecified: Secondary | ICD-10-CM | POA: Insufficient documentation

## 2012-10-23 DIAGNOSIS — I10 Essential (primary) hypertension: Secondary | ICD-10-CM | POA: Insufficient documentation

## 2012-10-23 DIAGNOSIS — Y835 Amputation of limb(s) as the cause of abnormal reaction of the patient, or of later complication, without mention of misadventure at the time of the procedure: Secondary | ICD-10-CM | POA: Insufficient documentation

## 2012-10-23 DIAGNOSIS — I2510 Atherosclerotic heart disease of native coronary artery without angina pectoris: Secondary | ICD-10-CM | POA: Insufficient documentation

## 2012-10-23 DIAGNOSIS — S88119A Complete traumatic amputation at level between knee and ankle, unspecified lower leg, initial encounter: Secondary | ICD-10-CM | POA: Insufficient documentation

## 2012-10-23 DIAGNOSIS — L97809 Non-pressure chronic ulcer of other part of unspecified lower leg with unspecified severity: Secondary | ICD-10-CM | POA: Insufficient documentation

## 2012-10-23 DIAGNOSIS — T8789 Other complications of amputation stump: Secondary | ICD-10-CM | POA: Insufficient documentation

## 2012-10-23 DIAGNOSIS — Z79899 Other long term (current) drug therapy: Secondary | ICD-10-CM | POA: Insufficient documentation

## 2012-10-23 DIAGNOSIS — Z8673 Personal history of transient ischemic attack (TIA), and cerebral infarction without residual deficits: Secondary | ICD-10-CM | POA: Insufficient documentation

## 2012-10-23 DIAGNOSIS — Z951 Presence of aortocoronary bypass graft: Secondary | ICD-10-CM | POA: Insufficient documentation

## 2012-10-23 DIAGNOSIS — T8189XA Other complications of procedures, not elsewhere classified, initial encounter: Secondary | ICD-10-CM | POA: Insufficient documentation

## 2012-10-23 DIAGNOSIS — I739 Peripheral vascular disease, unspecified: Secondary | ICD-10-CM | POA: Insufficient documentation

## 2012-10-23 HISTORY — DX: Personal history of other (healed) physical injury and trauma: Z87.828

## 2012-10-23 HISTORY — DX: Allergic rhinitis, unspecified: J30.9

## 2012-10-23 HISTORY — PX: I & D EXTREMITY: SHX5045

## 2012-10-23 HISTORY — DX: Personal history of transient ischemic attack (TIA), and cerebral infarction without residual deficits: Z86.73

## 2012-10-23 LAB — GLUCOSE, CAPILLARY: Glucose-Capillary: 239 mg/dL — ABNORMAL HIGH (ref 70–99)

## 2012-10-23 LAB — POCT I-STAT 4, (NA,K, GLUC, HGB,HCT)
Glucose, Bld: 257 mg/dL — ABNORMAL HIGH (ref 70–99)
HCT: 48 % (ref 39.0–52.0)

## 2012-10-23 SURGERY — IRRIGATION AND DEBRIDEMENT EXTREMITY
Anesthesia: Monitor Anesthesia Care | Site: Knee | Laterality: Left | Wound class: Contaminated

## 2012-10-23 MED ORDER — FENTANYL CITRATE 0.05 MG/ML IJ SOLN
INTRAMUSCULAR | Status: DC | PRN
  Administered 2012-10-23: 25 ug via INTRAVENOUS

## 2012-10-23 MED ORDER — LIDOCAINE-EPINEPHRINE 2 %-1:100000 IJ SOLN
INTRAMUSCULAR | Status: DC | PRN
  Administered 2012-10-23: 3 mL

## 2012-10-23 MED ORDER — BUPIVACAINE HCL (PF) 0.25 % IJ SOLN
INTRAMUSCULAR | Status: DC | PRN
  Administered 2012-10-23: 5 mL

## 2012-10-23 MED ORDER — DIPHENHYDRAMINE HCL 50 MG/ML IJ SOLN
INTRAMUSCULAR | Status: DC | PRN
  Administered 2012-10-23 (×2): 12.5 mg via INTRAVENOUS

## 2012-10-23 MED ORDER — LACTATED RINGERS IV SOLN
INTRAVENOUS | Status: DC
  Administered 2012-10-23 (×2): via INTRAVENOUS
  Filled 2012-10-23: qty 1000

## 2012-10-23 MED ORDER — FENTANYL CITRATE 0.05 MG/ML IJ SOLN
25.0000 ug | INTRAMUSCULAR | Status: DC | PRN
  Filled 2012-10-23: qty 1

## 2012-10-23 MED ORDER — ONDANSETRON HCL 4 MG/2ML IJ SOLN
INTRAMUSCULAR | Status: DC | PRN
  Administered 2012-10-23: 4 mg via INTRAVENOUS

## 2012-10-23 MED ORDER — PROPOFOL INFUSION 10 MG/ML OPTIME
INTRAVENOUS | Status: DC | PRN
  Administered 2012-10-23: 50 ug/kg/min via INTRAVENOUS

## 2012-10-23 MED ORDER — PROMETHAZINE HCL 25 MG/ML IJ SOLN
6.2500 mg | INTRAMUSCULAR | Status: DC | PRN
  Filled 2012-10-23: qty 1

## 2012-10-23 SURGICAL SUPPLY — 93 items
APL SKNCLS STERI-STRIP NONHPOA (GAUZE/BANDAGES/DRESSINGS)
BAG DECANTER FOR FLEXI CONT (MISCELLANEOUS) IMPLANT
BANDAGE ELASTIC 3 VELCRO ST LF (GAUZE/BANDAGES/DRESSINGS) IMPLANT
BANDAGE ELASTIC 4 VELCRO ST LF (GAUZE/BANDAGES/DRESSINGS) IMPLANT
BANDAGE ELASTIC 6 VELCRO ST LF (GAUZE/BANDAGES/DRESSINGS) ×1 IMPLANT
BANDAGE GAUZE ELAST BULKY 4 IN (GAUZE/BANDAGES/DRESSINGS) ×1 IMPLANT
BENZOIN TINCTURE PRP APPL 2/3 (GAUZE/BANDAGES/DRESSINGS) IMPLANT
BLADE MINI RND TIP GREEN BEAV (BLADE) IMPLANT
BLADE SURG 10 STRL SS (BLADE) ×1 IMPLANT
BLADE SURG 15 STRL LF DISP TIS (BLADE) ×1 IMPLANT
BLADE SURG 15 STRL SS (BLADE) ×2
BNDG CMPR 9X4 STRL LF SNTH (GAUZE/BANDAGES/DRESSINGS)
BNDG COHESIVE 1X5 TAN STRL LF (GAUZE/BANDAGES/DRESSINGS) IMPLANT
BNDG COHESIVE 4X5 TAN STRL (GAUZE/BANDAGES/DRESSINGS) IMPLANT
BNDG ESMARK 4X9 LF (GAUZE/BANDAGES/DRESSINGS) IMPLANT
CANISTER SUCT LVC 12 LTR MEDI- (MISCELLANEOUS) IMPLANT
CANISTER SUCTION 1200CC (MISCELLANEOUS) IMPLANT
CANISTER SUCTION 2500CC (MISCELLANEOUS) ×1 IMPLANT
CHLORAPREP W/TINT 26ML (MISCELLANEOUS) IMPLANT
CLOTH BEACON ORANGE TIMEOUT ST (SAFETY) ×2 IMPLANT
CORDS BIPOLAR (ELECTRODE) IMPLANT
COVER MAYO STAND STRL (DRAPES) ×2 IMPLANT
COVER TABLE BACK 60X90 (DRAPES) ×2 IMPLANT
DECANTER SPIKE VIAL GLASS SM (MISCELLANEOUS) IMPLANT
DRAIN PENROSE 18X1/2 LTX STRL (DRAIN) ×1 IMPLANT
DRAPE EXTREMITY T 121X128X90 (DRAPE) ×1 IMPLANT
DRAPE INCISE IOBAN 66X45 STRL (DRAPES) IMPLANT
DRAPE LG THREE QUARTER DISP (DRAPES) ×2 IMPLANT
DRSG EMULSION OIL 3X3 NADH (GAUZE/BANDAGES/DRESSINGS) IMPLANT
DRSG PAD ABDOMINAL 8X10 ST (GAUZE/BANDAGES/DRESSINGS) ×1 IMPLANT
ELECT NDL BLADE 2-5/6 (NEEDLE) IMPLANT
ELECT NDL TIP 2.8 STRL (NEEDLE) IMPLANT
ELECT NEEDLE BLADE 2-5/6 (NEEDLE) IMPLANT
ELECT NEEDLE TIP 2.8 STRL (NEEDLE) IMPLANT
ELECT REM PT RETURN 9FT ADLT (ELECTROSURGICAL) ×2
ELECTRODE REM PT RTRN 9FT ADLT (ELECTROSURGICAL) ×1 IMPLANT
GAUZE SPONGE 4X4 12PLY STRL LF (GAUZE/BANDAGES/DRESSINGS) IMPLANT
GAUZE XEROFORM 1X8 LF (GAUZE/BANDAGES/DRESSINGS) ×1 IMPLANT
GAUZE XEROFORM 5X9 LF (GAUZE/BANDAGES/DRESSINGS) IMPLANT
GLOVE BIO SURGEON STRL SZ 6.5 (GLOVE) ×4 IMPLANT
GLOVE INDICATOR 6.5 STRL GRN (GLOVE) ×1 IMPLANT
GOWN PREVENTION PLUS XLARGE (GOWN DISPOSABLE) ×3 IMPLANT
HANDPIECE INTERPULSE COAX TIP (DISPOSABLE)
IV NS IRRIG 3000ML ARTHROMATIC (IV SOLUTION) IMPLANT
NDL HYPO 30GX1 BEV (NEEDLE) IMPLANT
NEEDLE 27GAX1X1/2 (NEEDLE) ×1 IMPLANT
NEEDLE HYPO 30GX1 BEV (NEEDLE) IMPLANT
NS IRRIG 1000ML POUR BTL (IV SOLUTION) ×2 IMPLANT
PACK BASIN DAY SURGERY FS (CUSTOM PROCEDURE TRAY) ×2 IMPLANT
PADDING CAST ABS 3INX4YD NS (CAST SUPPLIES)
PADDING CAST ABS 4INX4YD NS (CAST SUPPLIES)
PADDING CAST ABS COTTON 3X4 (CAST SUPPLIES) IMPLANT
PADDING CAST ABS COTTON 4X4 ST (CAST SUPPLIES) IMPLANT
PADDING CAST COTTON 6X4 STRL (CAST SUPPLIES) ×1 IMPLANT
PENCIL BUTTON HOLSTER BLD 10FT (ELECTRODE) IMPLANT
SET HNDPC FAN SPRY TIP SCT (DISPOSABLE) IMPLANT
SLEEVE SCD COMPRESS KNEE MED (MISCELLANEOUS) IMPLANT
SPLINT PLASTER CAST XFAST 3X15 (CAST SUPPLIES) IMPLANT
SPLINT PLASTER XTRA FASTSET 3X (CAST SUPPLIES)
SPONGE GAUZE 4X4 12PLY (GAUZE/BANDAGES/DRESSINGS) ×2 IMPLANT
SPONGE LAP 18X18 X RAY DECT (DISPOSABLE) ×1 IMPLANT
SPONGE LAP 4X18 X RAY DECT (DISPOSABLE) ×1 IMPLANT
STAPLER VISISTAT 35W (STAPLE) IMPLANT
STOCKINETTE 4X48 STRL (DRAPES) IMPLANT
STOCKINETTE 6  STRL (DRAPES) ×1
STOCKINETTE 6 STRL (DRAPES) ×1 IMPLANT
STOCKINETTE IMPERVIOUS LG (DRAPES) IMPLANT
STRIP CLOSURE SKIN 1/2X4 (GAUZE/BANDAGES/DRESSINGS) IMPLANT
SUCTION FRAZIER TIP 10 FR DISP (SUCTIONS) ×1 IMPLANT
SURGILUBE 2OZ TUBE FLIPTOP (MISCELLANEOUS) IMPLANT
SUT ETHILON 3 0 PS 1 (SUTURE) IMPLANT
SUT ETHILON 4 0 P 3 18 (SUTURE) IMPLANT
SUT ETHILON 5 0 PS 2 18 (SUTURE) IMPLANT
SUT MON AB 4-0 PC3 18 (SUTURE) ×2 IMPLANT
SUT PROLENE 3 0 PS 2 (SUTURE) IMPLANT
SUT PROLENE 4 0 PS 2 18 (SUTURE) ×1 IMPLANT
SUT PROLENE 5 0 P 3 (SUTURE) ×1 IMPLANT
SUT SILK 3 0 PS 1 (SUTURE) IMPLANT
SUT VIC AB 3-0 FS2 27 (SUTURE) IMPLANT
SUT VIC AB 5-0 P-3 18X BRD (SUTURE) IMPLANT
SUT VIC AB 5-0 P3 18 (SUTURE)
SUT VIC AB 5-0 PS2 18 (SUTURE) IMPLANT
SYR BULB IRRIGATION 50ML (SYRINGE) IMPLANT
SYR CONTROL 10ML LL (SYRINGE) ×2 IMPLANT
TAPE HYPAFIX 6X30 (GAUZE/BANDAGES/DRESSINGS) IMPLANT
TIP FLEX 45CM EVICEL (HEMOSTASIS) IMPLANT
TIP RIGID 35CM EVICEL (HEMOSTASIS) IMPLANT
TOWEL OR 17X24 6PK STRL BLUE (TOWEL DISPOSABLE) ×2 IMPLANT
TRAY DSU PREP LF (CUSTOM PROCEDURE TRAY) IMPLANT
TUBE CONNECTING 12X1/4 (SUCTIONS) ×1 IMPLANT
UNDERPAD 30X30 INCONTINENT (UNDERPADS AND DIAPERS) ×2 IMPLANT
WATER STERILE IRR 1000ML POUR (IV SOLUTION) ×2 IMPLANT
YANKAUER SUCT BULB TIP NO VENT (SUCTIONS) IMPLANT

## 2012-10-23 NOTE — Anesthesia Procedure Notes (Signed)
Procedure Name: MAC Date/Time: 10/23/2012 7:33 AM Performed by: Fran Lowes Pre-anesthesia Checklist: Patient identified, Timeout performed, Emergency Drugs available, Suction available and Patient being monitored Patient Re-evaluated:Patient Re-evaluated prior to inductionOxygen Delivery Method: Simple face mask Intubation Type: IV induction

## 2012-10-23 NOTE — Anesthesia Postprocedure Evaluation (Signed)
  Anesthesia Post-op Note  Patient: Anthony Holder  Procedure(s) Performed: Procedure(s) (LRB): IRRIGATION AND DEBRIDEMENT EXTREMITY (Left)  Patient Location: PACU  Anesthesia Type: MAC  Level of Consciousness: awake and alert   Airway and Oxygen Therapy: Patient Spontanous Breathing  Post-op Pain: mild  Post-op Assessment: Post-op Vital signs reviewed, Patient's Cardiovascular Status Stable, Respiratory Function Stable, Patent Airway and No signs of Nausea or vomiting  Post-op Vital Signs: stable  Complications: No apparent anesthesia complications. FBS still elevated. He prefers to manage his sugar at home today. Declines insulin.
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
## 2012-10-23 NOTE — Transfer of Care (Signed)
Immediate Anesthesia Transfer of Care Note  Patient: Anthony Holder  Procedure(s) Performed: Procedure(s) (LRB): IRRIGATION AND DEBRIDEMENT EXTREMITY (Left)  Patient Location: Patient transported to PACU with oxygen via face mask at 3 Liters / Min  Anesthesia Type: MAC  Level of Consciousness: awake and alert   Airway & Oxygen Therapy: Patient Spontanous Breathing and Patient connected to face mask oxygen  Post-op Assessment: Report given to PACU RN and Post -op Vital signs reviewed and stable  Post vital signs: Reviewed and stable  Complications: No apparent anesthesia complications

## 2012-10-23 NOTE — Brief Op Note (Signed)
10/23/2012  8:01 AM  PATIENT:  Anthony Holder  71 y.o. male  PRE-OPERATIVE DIAGNOSIS:  ULCER OF left stump  POST-OPERATIVE DIAGNOSIS:  ULCER OF left stump  PROCEDURE:  Procedure(s) (LRB) with comments: IRRIGATION AND DEBRIDEMENT EXTREMITY (Left) - incision and deberidement of left leg ulcer stump with primary closure  SURGEON:  Surgeon(s) and Role:    * Jamille Fisher Sanger, DO - Primary  PHYSICIAN ASSISTANT:   ASSISTANTS: none   ANESTHESIA:   local  EBL:  Total I/O In: 200 [I.V.:200] Out: -   BLOOD ADMINISTERED:none  DRAINS: none   LOCAL MEDICATIONS USED:  MARCAINE    and LIDOCAINE   SPECIMEN:  No Specimen  DISPOSITION OF SPECIMEN:  N/A  COUNTS:  YES  TOURNIQUET:  * No tourniquets in log *  DICTATION: dictated  PLAN OF CARE: Discharge to home after PACU  PATIENT DISPOSITION:  PACU - hemodynamically stable.   Delay start of Pharmacological VTE agent (>24hrs) due to surgical blood loss or risk of bleeding: not applicable

## 2012-10-23 NOTE — Op Note (Signed)
NAMEYATES, NAZ NO.:  [id number]  MEDICAL RECORD NO.:  [id number]  LOCATION:  [facility]        FACILITY:  [facility]  PHYSICIAN:  Wayland Denis, DO      DATE OF BIRTH:  02-13-1941  DATE OF PROCEDURE:10/23/12 DATE OF DISCHARGE:                              OPERATIVE REPORT   PREOPERATIVE DIAGNOSIS:  Left lower extremity stump ulcer.  POSTOPERATIVE DIAGNOSIS:  Left lower extremity stump ulcer.  PROCEDURE:  Irrigation and debridement of left lower extremity ulcer with primary closure, 1 x 3 cm.  ATTENDING SURGEON:  Wayland Denis, DO  ANESTHESIA:  Local.  INDICATION FOR PROCEDURE:  The patient is a 71 year old gentleman, who has severe peripheral vascular disease and underwent serial amputation of his foot with improvement of his ulcers and required a left lower extremity below-knee amputation.  He then had trouble healing the medial portion of the incision.  He has been getting local dressing care from the wound center with some improvement, but no closure due to his disease.  The decision was made to take him to the OR for irrigation and debridement and attempt at primary closure.  He has been treated with antibiotics up to this point and there was no sign of active infection. Risks and complications were reviewed and included bleeding, pain, scar, risk of anesthesia, and nonhealing recurrent ulcer.  DESCRIPTION OF PROCEDURE:  The patient was taken to the operating room. He remained on the stretcher.  Sedation was administered to prepare the patient for being comfortable.  The left lower extremity was prepped and draped in the usual sterile fashion.  A 1% lidocaine with epinephrine was injected around the area after waiting several minutes.  A 0.25% Marcaine was injected for postop pain relief.  A 15 blade was then used to make an elliptical incision around the ulcerated area.  Saline was used to irrigate the wound.  Hemostasis was  achieved with electrocautery.  Several deep sutures were placed with 4-0 Monocryl and vertical mattress sutures were placed in the skin to reapproximate the skin edges using 4-0 Prolene.  A Xeroform was applied with 4x4s, Kerlix, Webril, and an Ace.  The patient tolerated the procedure well.  There were no complications.  He was allowed to wake up and taken to recovery room in stable condition.     Wayland Denis, DO     CS/MEDQ  D:  10/23/2012  T:  10/23/2012  Job:  [id number]

## 2012-10-24 NOTE — Op Note (Signed)
Anthony Holder, Anthony Holder NO.:  [id number]  MEDICAL RECORD NO.:  [id number]  LOCATION:  FOOT                         FACILITY:  [facility]  PHYSICIAN:  Wayland Denis, DO      DATE OF BIRTH:  May 14, 1941  DATE OF PROCEDURE:  10/23/2012 DATE OF DISCHARGE:                              OPERATIVE REPORT   PREOPERATIVE DIAGNOSIS:  Left lower extremity amputated stump ulcer secondary to chronic venous insufficiency.  POSTOPERATIVE DIAGNOSIS:  Left lower extremity amputated stump ulcer secondary to chronic venous insufficiency.  Repeat dictation  Dictation ended at this point.     Wayland Denis, DO     CS/MEDQ  D:  10/23/2012  T:  10/24/2012  Job:  [id number]

## 2012-10-25 ENCOUNTER — Encounter (HOSPITAL_BASED_OUTPATIENT_CLINIC_OR_DEPARTMENT_OTHER): Payer: Self-pay | Admitting: Plastic Surgery

## 2012-11-01 NOTE — Op Note (Signed)
Anthony Holder, Anthony Holder NO.:  [id number]  MEDICAL RECORD NO.:  [id number]  [facility]  PHYSICIAN:  Wayland Denis, DO      DATE OF BIRTH:  09-21-1941  DATE OF PROCEDURE:  10/23/2012 DATE OF DISCHARGE:                              OPERATIVE REPORT  PREOPERATIVE DIAGNOSIS: Left lower extremity stump ulcer  POSTOPERATIVE DIAGNOSIS: Left lower extremity stump ulcer  SURGEON: Alan Ripper Sanger  ANESTHESIA: Sedation  INDICATIONS:  The patient has suffered with left lower extremity ulcers due to combination of his vascular disease and comorbidities.  He underwent a below-knee amputation, with resultant stump dehiscence and skin breakdown.  [facility].  A portion of the medial aspect was not healing and the decision was made to bring him to the OR for closure.  Risks and complications were reviewed including bleeding, pain, scar, and risk of anesthesia, as well as recurrence and infection.  The patient wished to proceed.  DESCRIPTION OF PROCEDURE:  The patient was taken to the operating room on the stretcher and remained on the stretcher.  Sedation was administered and the patient was made comfortable.  A 1% lidocaine with epinephrine was injected around the area after it had been prepped.  A time-out was called.  All information was confirmed to be correct prior to the prep.  Once the area was numb and epinephrine had its effect, the 15 blade was used to make an elliptical incision and to excise the ulcer, it was 0.5 x 2 cm in size.  The area was irrigated with normal saline and antibiotic solution.  Deep suture was placed with 4-0 Monocryl and the skin edges were reapproximated with vertical mattress sutures.  Dry dressing with gauze, Kerlix, and an Ace wrap was applied. The patient tolerated the procedure well.  He was allowed to wake up and taken to the recovery room in stable  condition.     Wayland Denis, DO     CS/MEDQ  D:  10/31/2012  T:  11/01/2012  Job:  [id number]

## 2012-11-06 NOTE — Progress Notes (Signed)
[facility]  NAME:  Anthony Holder, Anthony Holder NO.:  [id number]  MEDICAL RECORD NO.:  [id number]      DATE OF BIRTH:  [birth year]  PHYSICIAN:  Wayland Denis, DO       VISIT DATE:  11/06/2012                                  OFFICE VISIT   The patient is a 71 year old gentleman who is here for followup after surgical excision of a left stump ulcer from a BKA.  He is doing extremely well.  The sutures are in place.  The skin is healed very well.  There is no sign of redness or swelling.  No sign of infection. There has been no change in his medications or social history.  Recommend little triple antibiotic ointment at the site where the sutures were removed and his stump sock to help decrease any stump swelling and follow up with his primary care physician.     Wayland Denis, DO     CS/MEDQ  D:  11/06/2012  T:  11/06/2012  Job:  [id number]

## 2012-11-20 ENCOUNTER — Encounter (HOSPITAL_BASED_OUTPATIENT_CLINIC_OR_DEPARTMENT_OTHER): Payer: Medicare Other

## 2013-01-16 NOTE — Op Note (Signed)
Anthony Holder, Anthony Holder NO.:  [id number]  MEDICAL RECORD NO.:  [id number]  [facility]         FACILITY:  [facility]  PHYSICIAN:  Wayland Denis, DO      DATE OF BIRTH:  July 15, 1941  DATE OF PROCEDURE:10/23/2012 DATE OF DISCHARGE:                               OPERATIVE REPORT   PREOPERATIVE DIAGNOSIS:  Left leg stump ulcer.  POSTOPERATIVE DIAGNOSIS:  Left leg stump ulcer.  PROCEDURE:  Irrigation and debridement of left leg ulcer from the stump 1 x 3 cm with primary closure.  ATTENDING SURGEON:  Wayland Denis, DO.  ANESTHESIA:  General.  INDICATION FOR PROCEDURE:  The patient is a 72 year old gentleman that had significant vascular problems with all attempts being made to salvage his limb.  Unfortunately, he ended up with a below-knee amputation, and then an ulcer of the amputated stump.  The remaining portion of the amputated stump on the medial aspect, we tried conservative measures with little improvement.  Therefore, the decision was made to go back to the operating room.  DESCRIPTION OF PROCEDURE:  The patient was taken to the operating room after consent was signed and confirmed.  Risks and complications were reviewed.  A time-out was called.  All information was confirmed to be correct.  He was prepped and draped in usual sterile fashion.  The wound was irrigated with antibiotic solution.  A #10 blade was used to excise the ulcer and remove the nonviable tissue down to healthy bleeding tissue.  The Bovie was used to obtain hemostasis after the incision was made with a #10 blade.  Several sutures were used to reapproximate the edges for improvement in the proximity of the tissue.  A 4 x 4 dressings were used and the patient was wrapped with Kerlix and Ace wrap.  The patient tolerated the procedure well.  There were no complications.  He was allowed to wake up, extubated, and taken to recovery room in stable condition.     Wayland Denis, DO     CS/MEDQ  D:  01/15/2013  T:  01/16/2013  Job:  [id number]

## 2013-02-22 ENCOUNTER — Encounter: Payer: Self-pay | Admitting: Infectious Diseases

## 2013-03-21 NOTE — Op Note (Deleted)
NAME:  Anthony Holder, Anthony Holder             ACCOUNT NO.:  [id number]  MEDICAL RECORD NO.:  [id number]  LOCATION:  FOOT                         FACILITY:  [facility]  PHYSICIAN:  Claire Sanger, DO      DATE OF BIRTH:  10/01/1941  DATE OF PROCEDURE:  10/23/2012 DATE OF DISCHARGE:  10/23/2012                              OPERATIVE REPORT   PREOPERATIVE DIAGNOSIS:  Left foot ulcer.  POSTOPERATIVE DIAGNOSIS:  Left foot ulcer.  PROCEDURE:  Irrigation, debridement of skin and subcutaneous tissue for purpose of placement of ACell and VAC.  ATTENDING SURGEON:  Claire Sanger, DO  ANESTHESIA:  General.  INDICATION FOR PROCEDURE:  The patient is a 71-year-old gentleman who suffered from left lower extremity ulceration.  He has undergone multiple debridements in the past.  DESCRIPTION OF PROCEDURE:  The patient was taken to the operating room after consent was signed and confirmed.  He was placed on the operating room table in the supine position.  General anesthesia was administered. Once adequate, a time-out was called and all information was confirmed to be correct.  He was prepped and draped in the usual sterile fashion. Copious amounts of irrigation with antibiotic solution was used to irrigate the wound.  The wound was irrigated with skin and subcutaneous tissue, and tendon at the plantar aspect of his foot.  Once that was done, ACell powder and sheet was placed.  A 10-blade was used to do the debridement with a pair of pickups and at times scissors.  Adaptic was placed over the ACell followed by Surgilube and then the VAC.  The patient tolerated the procedure well.  There were no complications.  He was allowed to wake up, extubated, and taken to recovery room in stable condition.     Claire Sanger, DO     CS/MEDQ  D:  03/20/2013  T:  03/21/2013  Job:  [id number] 

## 2013-03-21 NOTE — Op Note (Signed)
Anthony Holder, Anthony Holder NO.:  [id number]  MEDICAL RECORD NO.:  [id number]  LOCATION:  FOOT                         FACILITY:  [facility]  PHYSICIAN:  Wayland Denis, DO      DATE OF BIRTH:  03-07-1941  DATE OF PROCEDURE:  10/23/2012 DATE OF DISCHARGE:  10/23/2012                              OPERATIVE REPORT   PREOPERATIVE DIAGNOSIS:  Left foot ulcer.  POSTOPERATIVE DIAGNOSIS:  Left foot ulcer.  PROCEDURE:  Irrigation, debridement of skin and subcutaneous tissue for purpose of placement of ACell and VAC.  ATTENDING SURGEON:  Wayland Denis, DO  ANESTHESIA:  General.  INDICATION FOR PROCEDURE:  The patient is a 72 year old gentleman who suffered from left lower extremity ulceration.  He has undergone multiple debridements in the past.  DESCRIPTION OF PROCEDURE:  The patient was taken to the operating room after consent was signed and confirmed.  He was placed on the operating room table in the supine position.  General anesthesia was administered. Once adequate, a time-out was called and all information was confirmed to be correct.  He was prepped and draped in the usual sterile fashion. Copious amounts of irrigation with antibiotic solution was used to irrigate the wound.  The wound was irrigated with skin and subcutaneous tissue, and tendon at the plantar aspect of his foot.  Once that was done, ACell powder and sheet was placed.  A 10-blade was used to do the debridement with a pair of pickups and at times scissors.  Adaptic was placed over the ACell followed by Surgilube and then the VAC.  The patient tolerated the procedure well.  There were no complications.  He was allowed to wake up, extubated, and taken to recovery room in stable condition.     Wayland Denis, DO     CS/MEDQ  D:  03/20/2013  T:  03/21/2013  Job:  [id number]

## 2013-05-14 ENCOUNTER — Observation Stay (HOSPITAL_COMMUNITY)
Admission: EM | Admit: 2013-05-14 | Discharge: 2013-05-15 | Disposition: A | Discharge status: Home / Self Care | Payer: Medicare Other | Attending: Internal Medicine | Admitting: Internal Medicine

## 2013-05-14 ENCOUNTER — Encounter (HOSPITAL_COMMUNITY): Payer: Self-pay

## 2013-05-14 ENCOUNTER — Emergency Department (HOSPITAL_COMMUNITY): Payer: Medicare Other

## 2013-05-14 DIAGNOSIS — I10 Essential (primary) hypertension: Secondary | ICD-10-CM

## 2013-05-14 DIAGNOSIS — E785 Hyperlipidemia, unspecified: Secondary | ICD-10-CM

## 2013-05-14 DIAGNOSIS — Z8679 Personal history of other diseases of the circulatory system: Secondary | ICD-10-CM

## 2013-05-14 DIAGNOSIS — I119 Hypertensive heart disease without heart failure: Secondary | ICD-10-CM | POA: Diagnosis present

## 2013-05-14 DIAGNOSIS — Z9089 Acquired absence of other organs: Secondary | ICD-10-CM

## 2013-05-14 DIAGNOSIS — D72829 Elevated white blood cell count, unspecified: Secondary | ICD-10-CM

## 2013-05-14 DIAGNOSIS — Z79899 Other long term (current) drug therapy: Secondary | ICD-10-CM | POA: Insufficient documentation

## 2013-05-14 DIAGNOSIS — I350 Nonrheumatic aortic (valve) stenosis: Secondary | ICD-10-CM

## 2013-05-14 DIAGNOSIS — A4901 Methicillin susceptible Staphylococcus aureus infection, unspecified site: Secondary | ICD-10-CM

## 2013-05-14 DIAGNOSIS — E871 Hypo-osmolality and hyponatremia: Secondary | ICD-10-CM

## 2013-05-14 DIAGNOSIS — Z9049 Acquired absence of other specified parts of digestive tract: Secondary | ICD-10-CM

## 2013-05-14 DIAGNOSIS — A491 Streptococcal infection, unspecified site: Secondary | ICD-10-CM

## 2013-05-14 DIAGNOSIS — H532 Diplopia: Secondary | ICD-10-CM | POA: Insufficient documentation

## 2013-05-14 DIAGNOSIS — Z951 Presence of aortocoronary bypass graft: Secondary | ICD-10-CM

## 2013-05-14 DIAGNOSIS — I639 Cerebral infarction, unspecified: Secondary | ICD-10-CM

## 2013-05-14 DIAGNOSIS — M869 Osteomyelitis, unspecified: Secondary | ICD-10-CM

## 2013-05-14 DIAGNOSIS — E11621 Type 2 diabetes mellitus with foot ulcer: Secondary | ICD-10-CM

## 2013-05-14 DIAGNOSIS — E119 Type 2 diabetes mellitus without complications: Secondary | ICD-10-CM | POA: Insufficient documentation

## 2013-05-14 DIAGNOSIS — I96 Gangrene, not elsewhere classified: Secondary | ICD-10-CM

## 2013-05-14 DIAGNOSIS — L03119 Cellulitis of unspecified part of limb: Secondary | ICD-10-CM

## 2013-05-14 DIAGNOSIS — M79609 Pain in unspecified limb: Secondary | ICD-10-CM

## 2013-05-14 DIAGNOSIS — M7989 Other specified soft tissue disorders: Secondary | ICD-10-CM

## 2013-05-14 DIAGNOSIS — S069X9A Unspecified intracranial injury with loss of consciousness of unspecified duration, initial encounter: Secondary | ICD-10-CM

## 2013-05-14 DIAGNOSIS — I509 Heart failure, unspecified: Secondary | ICD-10-CM | POA: Insufficient documentation

## 2013-05-14 DIAGNOSIS — Z9889 Other specified postprocedural states: Secondary | ICD-10-CM

## 2013-05-14 DIAGNOSIS — A498 Other bacterial infections of unspecified site: Secondary | ICD-10-CM

## 2013-05-14 DIAGNOSIS — I2510 Atherosclerotic heart disease of native coronary artery without angina pectoris: Secondary | ICD-10-CM

## 2013-05-14 DIAGNOSIS — M4800 Spinal stenosis, site unspecified: Secondary | ICD-10-CM

## 2013-05-14 DIAGNOSIS — Z8673 Personal history of transient ischemic attack (TIA), and cerebral infarction without residual deficits: Secondary | ICD-10-CM | POA: Insufficient documentation

## 2013-05-14 DIAGNOSIS — E1129 Type 2 diabetes mellitus with other diabetic kidney complication: Secondary | ICD-10-CM | POA: Diagnosis present

## 2013-05-14 DIAGNOSIS — Z87898 Personal history of other specified conditions: Secondary | ICD-10-CM

## 2013-05-14 DIAGNOSIS — S060X0A Concussion without loss of consciousness, initial encounter: Principal | ICD-10-CM | POA: Insufficient documentation

## 2013-05-14 DIAGNOSIS — Y92009 Unspecified place in unspecified non-institutional (private) residence as the place of occurrence of the external cause: Secondary | ICD-10-CM | POA: Insufficient documentation

## 2013-05-14 DIAGNOSIS — W010XXA Fall on same level from slipping, tripping and stumbling without subsequent striking against object, initial encounter: Secondary | ICD-10-CM | POA: Insufficient documentation

## 2013-05-14 DIAGNOSIS — E11628 Type 2 diabetes mellitus with other skin complications: Secondary | ICD-10-CM

## 2013-05-14 DIAGNOSIS — R509 Fever, unspecified: Secondary | ICD-10-CM

## 2013-05-14 DIAGNOSIS — S88119A Complete traumatic amputation at level between knee and ankle, unspecified lower leg, initial encounter: Secondary | ICD-10-CM | POA: Insufficient documentation

## 2013-05-14 DIAGNOSIS — M1990 Unspecified osteoarthritis, unspecified site: Secondary | ICD-10-CM

## 2013-05-14 DIAGNOSIS — Z85828 Personal history of other malignant neoplasm of skin: Secondary | ICD-10-CM

## 2013-05-14 LAB — COMPREHENSIVE METABOLIC PANEL
ALT: 16 U/L (ref 0–53)
AST: 16 U/L (ref 0–37)
Albumin: 4 g/dL (ref 3.5–5.2)
Alkaline Phosphatase: 63 U/L (ref 39–117)
Chloride: 97 mEq/L (ref 96–112)
Potassium: 5.3 mEq/L — ABNORMAL HIGH (ref 3.5–5.1)
Sodium: 135 mEq/L (ref 135–145)
Total Bilirubin: 0.3 mg/dL (ref 0.3–1.2)
Total Protein: 7.7 g/dL (ref 6.0–8.3)

## 2013-05-14 LAB — CBC WITH DIFFERENTIAL/PLATELET
Basophils Absolute: 0.1 10*3/uL (ref 0.0–0.1)
Basophils Relative: 1 % (ref 0–1)
Hemoglobin: 15.1 g/dL (ref 13.0–17.0)
MCHC: 35.1 g/dL (ref 30.0–36.0)
Monocytes Relative: 9 % (ref 3–12)
Neutro Abs: 6.4 10*3/uL (ref 1.7–7.7)
Neutrophils Relative %: 56 % (ref 43–77)
Platelets: 252 10*3/uL (ref 150–400)

## 2013-05-14 NOTE — ED Notes (Addendum)
Pt reports falling Thursday hitting posterior head on a door jam. Pt reports a slight headache starting last night, denies N/V, LOC. Pt reports new onset of double vision today. No facial droop, arm drift, grips and strengths equal bilaterally.
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
## 2013-05-15 ENCOUNTER — Observation Stay (HOSPITAL_COMMUNITY): Payer: Medicare Other

## 2013-05-15 ENCOUNTER — Encounter (HOSPITAL_COMMUNITY): Payer: Self-pay | Admitting: Neurology

## 2013-05-15 DIAGNOSIS — H532 Diplopia: Secondary | ICD-10-CM

## 2013-05-15 DIAGNOSIS — E119 Type 2 diabetes mellitus without complications: Secondary | ICD-10-CM

## 2013-05-15 DIAGNOSIS — I10 Essential (primary) hypertension: Secondary | ICD-10-CM

## 2013-05-15 DIAGNOSIS — S060X0A Concussion without loss of consciousness, initial encounter: Principal | ICD-10-CM

## 2013-05-15 DIAGNOSIS — S069X9A Unspecified intracranial injury with loss of consciousness of unspecified duration, initial encounter: Secondary | ICD-10-CM

## 2013-05-15 MED ORDER — GLYBURIDE 5 MG PO TABS
5.0000 mg | ORAL_TABLET | Freq: Three times a day (TID) | ORAL | Status: DC
  Administered 2013-05-15: 5 mg via ORAL
  Filled 2013-05-15 (×4): qty 1

## 2013-05-15 MED ORDER — POTASSIUM CHLORIDE CRYS ER 20 MEQ PO TBCR: 20.0000 meq | EXTENDED_RELEASE_TABLET | ORAL | Status: DC

## 2013-05-15 MED ORDER — SODIUM CHLORIDE 0.9 % IJ SOLN: 3.0000 mL | Freq: Two times a day (BID) | INTRAMUSCULAR | Status: DC

## 2013-05-15 MED ORDER — VITAMIN C 500 MG PO TABS
500.0000 mg | ORAL_TABLET | Freq: Two times a day (BID) | ORAL | Status: DC
  Filled 2013-05-15 (×2): qty 1

## 2013-05-15 MED ORDER — COLESEVELAM HCL 625 MG PO TABS
1875.0000 mg | ORAL_TABLET | Freq: Two times a day (BID) | ORAL | Status: DC
  Administered 2013-05-15: 1250 mg via ORAL
  Filled 2013-05-15 (×2): qty 3

## 2013-05-15 MED ORDER — GABAPENTIN 300 MG PO CAPS
300.0000 mg | ORAL_CAPSULE | Freq: Three times a day (TID) | ORAL | Status: DC
  Administered 2013-05-15: 300 mg via ORAL
  Filled 2013-05-15 (×3): qty 1

## 2013-05-15 MED ORDER — FLUTICASONE PROPIONATE 50 MCG/ACT NA SUSP
2.0000 | Freq: Every day | NASAL | Status: DC
  Filled 2013-05-15: qty 16

## 2013-05-15 MED ORDER — FERROUS SULFATE 325 (65 FE) MG PO TABS
325.0000 mg | ORAL_TABLET | Freq: Every day | ORAL | Status: DC
  Filled 2013-05-15: qty 1

## 2013-05-15 MED ORDER — FUROSEMIDE 40 MG PO TABS
40.0000 mg | ORAL_TABLET | Freq: Two times a day (BID) | ORAL | Status: DC
  Filled 2013-05-15 (×3): qty 1

## 2013-05-15 MED ORDER — CYANOCOBALAMIN 250 MCG PO TABS
500.0000 ug | ORAL_TABLET | Freq: Two times a day (BID) | ORAL | Status: DC
  Filled 2013-05-15 (×2): qty 2

## 2013-05-15 MED ORDER — ASPIRIN EC 81 MG PO TBEC
81.0000 mg | DELAYED_RELEASE_TABLET | Freq: Every day | ORAL | Status: DC
  Administered 2013-05-15: 81 mg via ORAL
  Filled 2013-05-15: qty 1

## 2013-05-15 MED ORDER — VITAMIN D3 25 MCG (1000 UNIT) PO TABS
2000.0000 [IU] | ORAL_TABLET | Freq: Every day | ORAL | Status: DC
  Filled 2013-05-15: qty 2

## 2013-05-15 MED ORDER — DICLOFENAC SODIUM 75 MG PO TBEC
75.0000 mg | DELAYED_RELEASE_TABLET | Freq: Two times a day (BID) | ORAL | Status: DC
  Administered 2013-05-15: 75 mg via ORAL
  Filled 2013-05-15 (×2): qty 1

## 2013-05-15 MED ORDER — METFORMIN HCL ER 500 MG PO TB24
1000.0000 mg | ORAL_TABLET | Freq: Two times a day (BID) | ORAL | Status: DC
  Administered 2013-05-15: 1000 mg via ORAL
  Filled 2013-05-15 (×3): qty 2

## 2013-05-15 MED ORDER — OMEGA-3-ACID ETHYL ESTERS 1 G PO CAPS
1.0000 g | ORAL_CAPSULE | Freq: Two times a day (BID) | ORAL | Status: DC
  Filled 2013-05-15 (×2): qty 1

## 2013-05-15 MED ORDER — ATENOLOL 50 MG PO TABS
50.0000 mg | ORAL_TABLET | Freq: Two times a day (BID) | ORAL | Status: DC
  Administered 2013-05-15: 25 mg via ORAL
  Filled 2013-05-15 (×2): qty 1

## 2013-05-15 MED ORDER — INSULIN GLARGINE 100 UNIT/ML ~~LOC~~ SOLN
25.0000 [IU] | Freq: Two times a day (BID) | SUBCUTANEOUS | Status: DC
  Administered 2013-05-15: 25 [IU] via SUBCUTANEOUS
  Filled 2013-05-15 (×2): qty 0.25

## 2013-05-15 MED ORDER — TRAMADOL HCL 50 MG PO TABS: 50.0000 mg | ORAL_TABLET | Freq: Four times a day (QID) | ORAL | Status: DC | PRN

## 2013-05-15 NOTE — ED Provider Notes (Signed)
I saw and evaluated the patient, reviewed the resident's note and I agree with the findings and plan. If applicable, I agree with the resident's interpretation of the EKG.  If applicable, I was present for critical portions of any procedures performed.  Mechanical fall 4 days ago, diplopia started 2 days ago.  Resolves if one eye closed.  No focal weakness, numbness, tingling. No ataxia. 5/5 strength throughout EOM intact.  Glynn Octave, MD 05/15/13 [phone]
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
## 2013-05-15 NOTE — Progress Notes (Signed)
Utilization review completed. Fabian Walder, RN, [name]. 
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
## 2013-05-15 NOTE — Discharge Summary (Signed)
Physician Discharge Summary  Anthony Holder:[id number] DOB: July 25, 1941 DOA: 05/14/2013  PCP: [name], MD  Admit date: 05/14/2013 Discharge date: 05/15/2013  Recommendations for Outpatient Follow-up:  1. Followup with primary care doctor in one to 2 weeks or ongoing management of blood pressure and diabetes. 2. Followup with neurology in one to 2 weeks.  Discharge Diagnoses:  Principal Problem:   Diplopia Active Problems:   DIABETES MELLITUS, TYPE II   HYPERTENSION   CVA (cerebral infarction)   Concussion with no loss of consciousness   Traumatic brain injury   Discharge Condition: Stable, improved  Diet recommendation: Diabetic diet  Wt Readings from Last 3 Encounters:  05/15/13 94.53 kg (208 lb 6.4 oz)  10/23/12 88.905 kg (196 lb)  10/23/12 88.905 kg (196 lb)    History of present illness:   Anthony Holder is a 72 y.o. male who fell due to tripping from his prosthetic leg and hit the back of his head on a door knob (no LOC, no weakness). He had immediate onset of mild headache but otherwise did well (albeit with continuing headaches) until yesterday evening when he noticed he had developed diplopia. He reports that objects are at a diagonal and notices it more with distant objects compared to near objects. Because of this and his history which includes CVA in Feb of 2013 he presented to the ED.   In the ED CT scan of his head did not show any ICH or any new abnormality (did show old thalamic CVA). Neurology evaluated patient and feels that most likely his symptoms are due to closed head injury (concussion) but would like an MRI brain just to be sure he has not suffered a new CVA given his risk factors. Hospitalist asked to admit to obs.  Hospital Course:   Mr. Troop was hospitalized with diplopia.  His MRI of the brain demonstrated mild chronic microvascular ischemia.  There is no evidence of intracranial hemorrhage or stroke.    Chronic: DM2, HTN, CAD-  continues home meds.  His blood pressures tended to be in the 130s to 150s systolic over 40s to 90s.  He should continue to have his blood pressure checked as an outpatient.  His fingersticks were in the 200s.    Procedures:  CT head  MRI brain  Consultations:  Neurology  Discharge Exam: Filed Vitals:   05/15/13 1355  BP: 143/76  Pulse: 76  Temp: 98.1 F (36.7 C)  Resp: 18   Filed Vitals:   05/15/13 0530 05/15/13 0800 05/15/13 1220 05/15/13 1355  BP: 134/79 151/60 146/79 143/76  Pulse: 76 74 73 76  Temp: 98.3 F (36.8 C)  97.9 F (36.6 C) 98.1 F (36.7 C)  TempSrc:   Oral Oral  Resp: 18 18  18   Height:      Weight:      SpO2: 97% 97% 97% 96%   Recent states that he continues to have double vision in both eyes.  His vision is more double when he is looking straight ahead and better when he looks to the side. He did not have blurry vision in either eye and has good acuity bilaterally. He does not have any new focal weakness or numbness. He denies confusion or slurred speech. His headache got marginally worse overnight but is now getting better again.  General: Overweight Caucasian male, in no acute distress HEENT: NCAT, MMM Cardiovascular: Rate and rhythm, 3/6 systolic murmur at the right upper sternal border percent a decrescendo suggestive of aortic stenosis.  No rubs or gallops. 2+ pulses warm extremities Respiratory: Clear to auscultation bilaterally, no increased work of breathing ABD:  NABS, NT, ND MKS:  Left BKA, no lower extremity edema on the right side. Neuro: Cranial nerves II through XII grossly intact except for double vision.  Strength 5 out of 5 throughout. Loss of sensation in the left upper extremity which is chronic from a stroke about a year ago. No upper extremity dysmetria.  Discharge Instructions      Discharge Orders   Future Orders Complete By Expires     Call MD for:  difficulty breathing, headache or visual disturbances  As directed     Call  MD for:  extreme fatigue  As directed     Call MD for:  hives  As directed     Call MD for:  persistant dizziness or light-headedness  As directed     Call MD for:  persistant nausea and vomiting  As directed     Call MD for:  severe uncontrolled pain  As directed     Call MD for:  temperature >100.4  As directed     Diet Carb Modified  As directed     Discharge instructions  As directed     Comments:      You were hospitalized with double vision. Your head CT did not show any signs of bleeding. Your MRI of the brain did not show any signs of new stroke or bleeding.  You likely have a concussion. Please read through the information provided about concussions carefully. Followup with your primary care doctor in one to 2 weeks for further management of your diabetes and high blood pressure. Followup with a local neurologist within one to 2 weeks for evaluation for your concussion and traumatic brain injury.    Increase activity slowly  As directed         Medication List    TAKE these medications       aspirin EC 81 MG tablet  Take 81 mg by mouth daily.     atenolol 50 MG tablet  Commonly known as:  TENORMIN  Take 50 mg by mouth 2 (two) times daily.     CALCIUM-MAGNESIUM-ZINC PO  Take by mouth daily.     Chia Seed Oil Extract 1000 MG Caps  Take 1 capsule by mouth daily.     CHOLEST OFF PO  Take 1 tablet by mouth daily.     ciclesonide 50 MCG/ACT nasal spray  Commonly known as:  OMNARIS  Place 2 sprays into both nostrils daily.     CINNAMON PLUS CHROMIUM [phone] MCG-MG Caps  Generic drug:  Chromium-Cinnamon  Take 1 capsule by mouth daily.     colesevelam 625 MG tablet  Commonly known as:  WELCHOL  Take 1,875 mg by mouth 2 (two) times daily.     diclofenac 75 MG EC tablet  Commonly known as:  VOLTAREN  Take 75 mg by mouth 2 (two) times daily.     fish oil-omega-3 fatty acids 1000 MG capsule  Take 1 g by mouth 2 (two) times daily.     furosemide 40 MG tablet   Commonly known as:  LASIX  Take 40 mg by mouth 2 (two) times daily.     gabapentin 300 MG capsule  Commonly known as:  NEURONTIN  Take 300 mg by mouth 3 (three) times daily.     Garlic 1000 MG Caps  Take 1 capsule by mouth 2 (two) times daily.  glyBURIDE 5 MG tablet  Commonly known as:  DIABETA  Take 5 mg by mouth 3 (three) times daily.     GRAPE SEED EXTRACT PO  Take 1 tablet by mouth daily.     Iron 325 (65 FE) MG Tabs  Take 1 tablet by mouth daily.     LANTUS SOLOSTAR 100 UNIT/ML Sopn  Generic drug:  Insulin Glargine  Inject 34 Units into the skin 2 (two) times daily. 34 units in the morning 25units in the evening depending on BS. Usually takes 25-26 units in the morning and 26-30 units in the evening.     metFORMIN 500 MG 24 hr tablet  Commonly known as:  GLUCOPHAGE-XR  Take 1,000 mg by mouth 2 (two) times daily.     potassium chloride SA 20 MEQ tablet  Commonly known as:  K-DUR,KLOR-CON  Take 20 mEq by mouth every other day.     PROBIOTIC PO  Take 1 capsule by mouth daily.     traMADol 50 MG tablet  Commonly known as:  ULTRAM  Take 50 mg by mouth every 6 (six) hours as needed for pain.     vitamin B-12 500 MCG tablet  Commonly known as:  CYANOCOBALAMIN  Take 500 mcg by mouth 2 (two) times daily.     vitamin C 500 MG tablet  Commonly known as:  ASCORBIC ACID  Take 500 mg by mouth 2 (two) times daily.     Vitamin D-3 1000 UNITS Caps  Take 2,000 Units by mouth daily.       Follow-up Information   Follow up with MCKEOWN,WILLIAM [name], MD. Schedule an appointment as soon as possible for a visit in 1 week.   Contact information:   [address] [place] [address] [phone]       Follow up with Neurology clinic. Schedule an appointment as soon as possible for a visit in 1 week.   Contact information:   May contact any local neurology office.  Need followup in one to 2 weeks for traumatic brain injury.       The results of  significant diagnostics from this hospitalization (including imaging, microbiology, ancillary and laboratory) are listed below for reference.    Significant Diagnostic Studies: Ct Head Wo Contrast  05/14/2013   *RADIOLOGY REPORT*  Clinical Data: Status post fall on Thursday; hit posterior head on door jamb.  Slight headache and new onset of double vision.  CT HEAD WITHOUT CONTRAST  Technique:  Contiguous axial images were obtained from the base of the skull through the vertex without contrast.  Comparison: None.  Findings: There is no evidence of acute infarction, mass lesion, or intra- or extra-axial hemorrhage on CT.  Scattered periventricular and subcortical white matter change likely reflects small vessel ischemic microangiopathy.  A chronic infarct is noted in the right thalamus.  The posterior fossa, including the cerebellum, brainstem and fourth ventricle, is within normal limits.  The third and lateral ventricles are unremarkable in appearance.  The cerebral hemispheres demonstrate grossly normal gray-white differentiation. No mass effect or midline shift is seen.  There is no evidence of fracture; visualized osseous structures are unremarkable in appearance.  The orbits are within normal limits. The paranasal sinuses and mastoid air cells are well-aerated.  Mild soft tissue swelling is noted overlying the occipital calvarium.  IMPRESSION:  1.  No evidence of traumatic intracranial injury or fracture. 2.  Mild soft tissue swelling overlying the occipital calvarium. 3.  Scattered small vessel ischemic microangiopathy; chronic  infarct in the right thalamus.   Original Report Authenticated By: Tonia Ghent, M.D.   Mr Brain Wo Contrast  05/15/2013   *RADIOLOGY REPORT*  Clinical Data: Diplopia  MRI HEAD WITHOUT CONTRAST  Technique:  Multiplanar, multiecho pulse sequences of the brain and surrounding structures were obtained according to standard protocol without intravenous contrast.  Comparison: CT  05/14/2013  Findings: Negative for acute infarct.  Mild chronic microvascular ischemic change in the cerebral white matter.  Chronic infarct right thalamus.  Mild chronic ischemia in the central pons. Cerebellum intact.  Negative for intracranial hemorrhage.  No mass or edema.  Ventricle size is normal and there is no shift to the midline structures.  Left mastoid sinus effusion.  Mild mucosal edema in the paranasal sinuses.  IMPRESSION: No acute abnormality.  Mild chronic microvascular ischemia.   Original Report Authenticated By: Janeece Riggers, M.D.    Microbiology: No results found for this or any previous visit (from the past 240 hour(s)).   Labs: Basic Metabolic Panel:  Recent Labs Lab 05/14/13 2107  NA 135  K 5.3*  CL 97  CO2 29  GLUCOSE 264*  BUN 16  CREATININE 1.04  CALCIUM 10.2   Liver Function Tests:  Recent Labs Lab 05/14/13 2107  AST 16  ALT 16  ALKPHOS 63  BILITOT 0.3  PROT 7.7  ALBUMIN 4.0   No results found for this basename: LIPASE, AMYLASE,  in the last 168 hours No results found for this basename: AMMONIA,  in the last 168 hours CBC:  Recent Labs Lab 05/14/13 2107  WBC 11.5*  NEUTROABS 6.4  HGB 15.1  HCT 43.0  MCV 84.8  PLT 252   Cardiac Enzymes: No results found for this basename: CKTOTAL, CKMB, CKMBINDEX, TROPONINI,  in the last 168 hours BNP: BNP (last 3 results) No results found for this basename: PROBNP,  in the last 8760 hours CBG:  Recent Labs Lab 05/15/13 0519 05/15/13 0750 05/15/13 1125  GLUCAP 298* 258* 246*    Time coordinating discharge: 45 minutes  Signed:  Lucifer Soja  [facility] 05/15/2013, 2:26 PM

## 2013-05-15 NOTE — Progress Notes (Signed)
Nutrition Brief Note  Patient identified on the Malnutrition Screening Tool (MST) Report for recent weight loss without trying.  Per records below, patient's weight fluctuates likely due to medical hx (ie CHF).  Wt Readings from Last 10 Encounters:  05/15/13 208 lb 6.4 oz (94.53 kg)  10/23/12 196 lb (88.905 kg)  10/23/12 196 lb (88.905 kg)  07/04/12 186 lb 15.9 oz (84.82 kg)  07/04/12 186 lb 15.9 oz (84.82 kg)  07/04/12 186 lb 15.9 oz (84.82 kg)  06/12/12 196 lb (88.905 kg)  05/25/12 196 lb (88.905 kg)  05/25/12 196 lb (88.905 kg)  05/10/12 196 lb (88.905 kg)    Body mass index is 29.9 kg/(m^2). Patient meets criteria for Overweight based on current BMI.   Current diet order is Carbohydrate Modified Medium Calorie. Labs and medications reviewed.   No nutrition interventions warranted at this time. If nutrition issues arise, please consult RD.   Maureen Chatters, RD, LDN Pager #: [phone] After-Hours Pager #: [phone]

## 2013-06-15 ENCOUNTER — Ambulatory Visit (INDEPENDENT_AMBULATORY_CARE_PROVIDER_SITE_OTHER): Payer: Medicare Other | Admitting: Neurology

## 2013-06-15 ENCOUNTER — Encounter: Payer: Self-pay | Admitting: Neurology

## 2013-06-15 VITALS — BP 137/93 | HR 82 | Ht 71.0 in | Wt 215.0 lb

## 2013-06-15 DIAGNOSIS — L97509 Non-pressure chronic ulcer of other part of unspecified foot with unspecified severity: Secondary | ICD-10-CM

## 2013-06-15 DIAGNOSIS — I639 Cerebral infarction, unspecified: Secondary | ICD-10-CM

## 2013-06-15 DIAGNOSIS — E1169 Type 2 diabetes mellitus with other specified complication: Secondary | ICD-10-CM

## 2013-06-15 DIAGNOSIS — H532 Diplopia: Secondary | ICD-10-CM

## 2013-06-15 DIAGNOSIS — E11621 Type 2 diabetes mellitus with foot ulcer: Secondary | ICD-10-CM

## 2013-06-15 DIAGNOSIS — I6350 Cerebral infarction due to unspecified occlusion or stenosis of unspecified cerebral artery: Secondary | ICD-10-CM

## 2013-06-15 MED ORDER — CLOPIDOGREL BISULFATE 75 MG PO TABS: 75.0000 mg | ORAL_TABLET | Freq: Every day | ORAL | Status: DC
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
## 2013-06-27 LAB — RPR: RPR: NONREACTIVE

## 2013-06-27 LAB — C-REACTIVE PROTEIN: CRP: 10.8 mg/L — ABNORMAL HIGH (ref 0.0–4.9)

## 2013-06-27 LAB — ANA: Anti Nuclear Antibody(ANA): NEGATIVE

## 2013-06-27 LAB — VITAMIN B12: Vitamin B-12: 1046 pg/mL — ABNORMAL HIGH (ref 211–946)

## 2013-06-27 NOTE — Progress Notes (Signed)
Quick Note:  I called and relayed results of labs to pt. No other changes to treatment plan. He verbalized understanding. ______

## 2013-06-27 NOTE — Progress Notes (Signed)
Quick Note:  Please call him, mild elevated CRP, keep treatment plan as discussed during visit. ______

## 2013-09-11 ENCOUNTER — Ambulatory Visit: Payer: Medicare Other | Admitting: Nurse Practitioner

## 2013-09-28 ENCOUNTER — Encounter (INDEPENDENT_AMBULATORY_CARE_PROVIDER_SITE_OTHER): Payer: Self-pay

## 2013-09-28 ENCOUNTER — Ambulatory Visit (INDEPENDENT_AMBULATORY_CARE_PROVIDER_SITE_OTHER): Payer: Medicare Other | Admitting: Cardiology

## 2013-09-28 ENCOUNTER — Encounter: Payer: Self-pay | Admitting: Cardiology

## 2013-09-28 VITALS — BP 151/79 | HR 75 | Ht 71.0 in | Wt 215.0 lb

## 2013-09-28 DIAGNOSIS — I2510 Atherosclerotic heart disease of native coronary artery without angina pectoris: Secondary | ICD-10-CM

## 2013-09-28 DIAGNOSIS — I509 Heart failure, unspecified: Secondary | ICD-10-CM
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
## 2013-09-28 NOTE — Patient Instructions (Signed)
Your physician recommends that you schedule a follow-up appointment with Dr. Arbie Cookey  Your physician recommends that you continue on your current medications as directed. Please refer to the Current Medication list given to you today.

## 2013-10-01 ENCOUNTER — Encounter: Payer: Self-pay | Admitting: Cardiology

## 2013-11-12 ENCOUNTER — Other Ambulatory Visit: Payer: Self-pay | Admitting: *Deleted

## 2013-11-12 ENCOUNTER — Encounter: Payer: Self-pay | Admitting: Vascular Surgery

## 2013-11-12 DIAGNOSIS — Z48812 Encounter for surgical aftercare following surgery on the circulatory system: Secondary | ICD-10-CM

## 2013-11-13 ENCOUNTER — Ambulatory Visit (INDEPENDENT_AMBULATORY_CARE_PROVIDER_SITE_OTHER): Payer: Medicare Other | Admitting: Vascular Surgery

## 2013-11-13 ENCOUNTER — Encounter: Payer: Self-pay | Admitting: Vascular Surgery

## 2013-11-13 ENCOUNTER — Ambulatory Visit (HOSPITAL_COMMUNITY)
Admission: RE | Admit: 2013-11-13 | Discharge: 2013-11-13 | Disposition: A | Discharge status: Home / Self Care | Payer: Medicare Other | Source: Ambulatory Visit | Attending: Vascular Surgery | Admitting: Vascular Surgery

## 2013-11-13 DIAGNOSIS — I6529 Occlusion and stenosis of unspecified carotid artery: Secondary | ICD-10-CM

## 2013-11-13 DIAGNOSIS — Z48812 Encounter for surgical aftercare following surgery on the circulatory system: Secondary | ICD-10-CM

## 2013-11-13 NOTE — Addendum Note (Signed)
Addended by: Dannielle Karvonen on: 11/13/2013 04:25 PM   Modules accepted: Orders

## 2013-11-13 NOTE — Progress Notes (Signed)
Patient here today for followup of his right carotid endarterectomy in 2010 in moderate left carotid disease. He did have a stroke and was seen by a neurologist about a year ago. This came around the same time as a fall and concussion. He did have some left arm weakness. No evidence of carotid disease to account for this.  Long history of diabetes status post below knee amputation. He denies any new focal neurologic deficits.  Physical exam well-developed gentleman in no acute distress Right carotid incision well-healed with no bruits bilaterally. 2+ radial pulses bilaterally Heart regular rate and rhythm without murmur Grossly intact neurologically   Carotid duplex in our office today revealed widely patent right endarterectomy with no recurrent stenosis. Left carotid shows slight progression in the 40-59% range  Fresh and plan stable from a carotid standpoint. Will continue his usual activities. Will see [place] again in one year with a carotid duplex. Will notify us if he develops any focal deficits

## 2013-11-27 ENCOUNTER — Encounter: Payer: Self-pay | Admitting: Physician Assistant

## 2013-11-27 ENCOUNTER — Ambulatory Visit (INDEPENDENT_AMBULATORY_CARE_PROVIDER_SITE_OTHER): Payer: Medicare Other | Admitting: Physician Assistant

## 2013-11-27 VITALS — BP 110/62 | HR 88 | Temp 97.7°F | Resp 16 | Ht 69.0 in | Wt 206.0 lb

## 2013-11-27 DIAGNOSIS — H6591 Unspecified nonsuppurative otitis media, right ear: Secondary | ICD-10-CM

## 2013-11-27 DIAGNOSIS — H6590 Unspecified nonsuppurative otitis media, unspecified ear: Secondary | ICD-10-CM

## 2013-11-27 MED ORDER — NEOMYCIN-POLYMYXIN-HC 3.5-10000-1 OT SOLN: 4.0000 [drp] | Freq: Four times a day (QID) | OTIC | Status: DC

## 2013-11-27 NOTE — Patient Instructions (Signed)
Tympanic Membrane Perforation The eardrum (tympanic membrane) protects the inner ear from the outside environment. In addition to protection, the eardrum allows you to hear by transmitting sound waves to the bones in your ear and then to the nervous system. The tympanic membrane is easily perforated, which may result in damage to the inner ear. SYMPTOMS   Sometimes there are no symptoms.  Decreased hearing.  Fluid drainage from ear.  Ear pain. CAUSES   Most commonly, a middle ear infection from built-up pressure.  Injury from a cotton swab.  Traumatic injury to the side of the head. RISK INCREASES WITH:  Frequent middle ear infections.  Use of cotton swabs. PREVENTION   Do not use cotton swabs to clean the ear canal.  If you have ear pain or pressure, see your caregiver to rule out an ear infection that needs treatment. TREATMENT  Protecting the inner ear and allowing the membrane to heal on its own is how tympanic membrane rupture is usually treated. Healing may take several weeks. In order to protect the inner ear, do not allow any fluid to enter the ear canal. Avoid being submerged in water. The use of ear drops may prevent an ear infection from developing, but they should be used with caution, as ear drops can also cause damage to the inner ear. It is important to follow up with your caregiver to confirm healing of the tympanic membrane. If the membrane does not heal, permanent hearing loss may occur. To avoid serious complications, tympanic membranes that do not heal on their own are repaired with surgery. Document Released: 12/06/2005 Document Revised: 02/28/2012 Document Reviewed: 03/20/2009 [employer] Patient Information 2014 [employer].  

## 2013-11-27 NOTE — Progress Notes (Signed)
   Subjective:    Patient ID: Anthony Holder, male    DOB: 05/24/1941, 72 y.o.   MRN: [id number]  Otalgia  There is pain in the right ear. This is a new problem. Episode onset: 2-3 days. The problem occurs constantly. The problem has been rapidly worsening (Had a URI and went to urgent care last Thursday, had ABX). There has been no fever. The pain is mild. Associated symptoms include coughing, ear discharge (was clear and then was bloody last night), headaches, hearing loss, neck pain and rhinorrhea. Pertinent negatives include no abdominal pain, diarrhea, drainage, rash, sore throat or vomiting. He has tried antibiotics for the symptoms. The treatment provided mild relief. His past medical history is significant for hearing loss.    Review of Systems  HENT: Positive for ear discharge (was clear and then was bloody last night), ear pain, hearing loss and rhinorrhea. Negative for sore throat.   Respiratory: Positive for cough.   Gastrointestinal: Negative for vomiting, abdominal pain and diarrhea.  Musculoskeletal: Positive for neck pain.  Skin: Negative for rash.  Neurological: Positive for headaches.       Objective:   Physical Exam  Constitutional: He appears well-developed.  HENT:  Head: Normocephalic and atraumatic.  Right Ear: There is drainage (blood). No swelling or tenderness. No mastoid tenderness. Tympanic membrane is perforated. Decreased hearing is noted.  Left Ear: Tympanic membrane, external ear and ear canal normal.  Mouth/Throat: Oropharynx is clear and moist.  Eyes: Pupils are equal, round, and reactive to light.  Neck: Normal range of motion. Neck supple.  Lymphadenopathy:    He has no cervical adenopathy.  Skin: Skin is warm and dry.       Assessment & Plan:  Otitis media, serous, TM rupture, right - Plan: neomycin-polymyxin-hydrocortisone (CORTISPORIN) otic solution  Follow up in one week to reevaluate, if he gets any headache, ear pain, fever, chills he will  call the office or go to the ER

## 2013-12-03 ENCOUNTER — Ambulatory Visit (INDEPENDENT_AMBULATORY_CARE_PROVIDER_SITE_OTHER): Payer: Medicare Other | Admitting: Internal Medicine

## 2013-12-03 VITALS — BP 114/78 | HR 92 | Temp 97.2°F | Resp 20 | Wt 211.6 lb

## 2013-12-03 DIAGNOSIS — H6691 Otitis media, unspecified, right ear: Secondary | ICD-10-CM

## 2013-12-03 DIAGNOSIS — H6241 Otitis externa in other diseases classified elsewhere, right ear: Secondary | ICD-10-CM

## 2013-12-03 DIAGNOSIS — H60399 Other infective otitis externa, unspecified ear: Secondary | ICD-10-CM

## 2013-12-03 DIAGNOSIS — H66019 Acute suppurative otitis media with spontaneous rupture of ear drum, unspecified ear: Secondary | ICD-10-CM

## 2013-12-03 MED ORDER — LEVOFLOXACIN 500 MG PO TABS: 500.0000 mg | ORAL_TABLET | Freq: Every day | ORAL | Status: AC

## 2013-12-03 MED ORDER — HYDROCODONE-ACETAMINOPHEN 5-325 MG PO TABS: ORAL_TABLET | ORAL | Status: DC

## 2013-12-03 NOTE — Patient Instructions (Signed)
  Gently rinse right ear canal with Hydrogen peroxide 4 to 4 x daily   Otitis Externa Otitis externa is a bacterial or fungal infection of the outer ear canal. This is the area from the eardrum to the outside of the ear. Otitis externa is sometimes called "swimmer's ear." CAUSES  Possible causes of infection include:  Swimming in dirty water.  Moisture remaining in the ear after swimming or bathing.  Mild injury (trauma) to the ear.  Objects stuck in the ear (foreign body).  Cuts or scrapes (abrasions) on the outside of the ear. SYMPTOMS  The first symptom of infection is often itching in the ear canal. Later signs and symptoms may include swelling and redness of the ear canal, ear pain, and yellowish-white fluid (pus) coming from the ear. The ear pain may be worse when pulling on the earlobe. DIAGNOSIS  Your caregiver will perform a physical exam. A sample of fluid may be taken from the ear and examined for bacteria or fungi. TREATMENT  Antibiotic ear drops are often given for 10 to 14 days. Treatment may also include pain medicine or corticosteroids to reduce itching and swelling. PREVENTION   Keep your ear dry. Use the corner of a towel to absorb water out of the ear canal after swimming or bathing.  Avoid scratching or putting objects inside your ear. This can damage the ear canal or remove the protective wax that lines the canal. This makes it easier for bacteria and fungi to grow.  Avoid swimming in lakes, polluted water, or poorly chlorinated pools.  You may use ear drops made of rubbing alcohol and vinegar after swimming. Combine equal parts of white vinegar and alcohol in a bottle. Put 3 or 4 drops into each ear after swimming. HOME CARE INSTRUCTIONS   Apply antibiotic ear drops to the ear canal as prescribed by your caregiver.  Only take over-the-counter or prescription medicines for pain, discomfort, or fever as directed by your caregiver.  If you have diabetes,  follow any additional treatment instructions from your caregiver.  Keep all follow-up appointments as directed by your caregiver. SEEK MEDICAL CARE IF:   You have a fever.  Your ear is still red, swollen, painful, or draining pus after 3 days.  Your redness, swelling, or pain gets worse.  You have a severe headache.  You have redness, swelling, pain, or tenderness in the area behind your ear. MAKE SURE YOU:   Understand these instructions.  Will watch your condition.  Will get help right away if you are not doing well or get worse. Document Released: 12/06/2005 Document Revised: 02/28/2012 Document Reviewed: 12/23/2011 [facility] Patient Information 2014 [place], [place].

## 2013-12-03 NOTE — Progress Notes (Signed)
Subjective:     Patient ID: Anthony Holder, male   DOB: 01-15-1941, 72 y.o.   MRN: [id number]  HPI 72 yo MWM IDDM with HTH, ASHD, Hx/o CVA, ASPVD s/P Lt BKA who returns for1 week  F/U of Perforated Rt TM. Patient relates he is having some discharge from the Rt EAC.   Review of Systems  Neg except as above    Medication List       This list is accurate as of: 12/03/13  8:17 PM.  Always use your most recent med list.               ADVOCATE BLOOD GLUCOSE MONITOR Devi     atenolol 50 MG tablet  Commonly known as:  TENORMIN  Take 50 mg by mouth 2 (two) times daily.     BOOSTRIX 5-2.5-18.5 LF-MCG/0.5 injection  Generic drug:  Tdap     CALCIUM-MAGNESIUM-ZINC PO  Take by mouth daily.     Chia Seed Oil Extract 1000 MG Caps  Take 1 capsule by mouth daily.     CHOLEST OFF PO  Take 1 tablet by mouth daily.     ciclesonide 50 MCG/ACT nasal spray  Commonly known as:  OMNARIS  Place 2 sprays into both nostrils daily.     CINNAMON PLUS CHROMIUM [phone] MCG-MG Caps  Generic drug:  Chromium-Cinnamon  Take 1 capsule by mouth daily.     clopidogrel 75 MG tablet  Commonly known as:  PLAVIX  Take 1 tablet (75 mg total) by mouth daily.     colesevelam 625 MG tablet  Commonly known as:  WELCHOL  Take 1,875 mg by mouth 2 (two) times daily.     diclofenac 75 MG EC tablet  Commonly known as:  VOLTAREN  Take 75 mg by mouth 2 (two) times daily.     fenofibrate micronized 134 MG capsule  Commonly known as:  LOFIBRA  134 mg daily.     fish oil-omega-3 fatty acids 1000 MG capsule  Take 1 g by mouth 2 (two) times daily.     fluconazole 150 MG tablet  Commonly known as:  DIFLUCAN  Take 150 mg by mouth daily.     furosemide 40 MG tablet  Commonly known as:  LASIX  Take 40 mg by mouth 2 (two) times daily.     gabapentin 300 MG capsule  Commonly known as:  NEURONTIN  Take 300 mg by mouth 3 (three) times daily.     Garlic 1000 MG Caps  Take 1 capsule by mouth 2 (two) times  daily.     glyBURIDE 5 MG tablet  Commonly known as:  DIABETA  Take 5 mg by mouth 3 (three) times daily.     GRAPE SEED EXTRACT PO  Take 1 tablet by mouth daily.     HYDROcodone-acetaminophen 5-325 MG per tablet  Commonly known as:  NORCO  1/2 to 1 tablet every 3 to 4 hours as need for pain     Iron 325 (65 FE) MG Tabs  Take 1 tablet by mouth daily.     LANTUS SOLOSTAR 100 UNIT/ML Sopn  Generic drug:  Insulin Glargine  Inject 34 Units into the skin 2 (two) times daily. 34 units in the morning 25units in the evening depending on BS. Usually takes 25-26 units in the morning and 26-30 units in the evening.     levofloxacin 500 MG tablet  Commonly known as:  LEVAQUIN  Take 1 tablet (500 mg total) by mouth daily.  For infection     metFORMIN 500 MG 24 hr tablet  Commonly known as:  GLUCOPHAGE-XR  Take 1,000 mg by mouth 2 (two) times daily.     neomycin-polymyxin-hydrocortisone otic solution  Commonly known as:  CORTISPORIN  Place 4 drops into the right ear 4 (four) times daily.     NITROSTAT 0.4 MG SL tablet  Generic drug:  nitroGLYCERIN  Take 0.4 mg by mouth daily.     ofloxacin 0.3 % otic solution  Commonly known as:  FLOXIN     potassium chloride SA 20 MEQ tablet  Commonly known as:  K-DUR,KLOR-CON  Take 20 mEq by mouth every other day.     PROBIOTIC PO  Take 1 capsule by mouth daily.     traMADol 50 MG tablet  Commonly known as:  ULTRAM  Take 50 mg by mouth every 6 (six) hours as needed for pain.     vitamin B-12 500 MCG tablet  Commonly known as:  CYANOCOBALAMIN  Take 500 mcg by mouth 2 (two) times daily.     vitamin C 500 MG tablet  Commonly known as:  ASCORBIC ACID  Take 500 mg by mouth 2 (two) times daily.     Vitamin D-3 1000 UNITS Caps  Take 2,000 Units by mouth daily.           Objective:   Physical Exam  Lt EAC/TM- WNL Rt EAC obscured with soupy exudate - no peri- auricular signs of cellulitis Naso-opharynx - clear Neck - supple - noa  denopathy  Assessment:     1. Otitis externa, right ear   2. Acute otitis media with perforated TM, right    Plan:   Rx Levaquin 500 mg (# 14) 1 Tab daily  Advise gentle irrigations of the Rt EAC w/ hydrogen peroxide 2-4 x daily  RTC 2 wks to recheck or sooner if problems

## 2013-12-04 ENCOUNTER — Ambulatory Visit: Payer: Self-pay | Admitting: Physician Assistant

## 2013-12-11 ENCOUNTER — Encounter: Payer: Self-pay | Admitting: Physician Assistant

## 2013-12-11 DIAGNOSIS — I10 Essential (primary) hypertension: Secondary | ICD-10-CM

## 2013-12-11 DIAGNOSIS — E669 Obesity, unspecified: Secondary | ICD-10-CM

## 2013-12-11 DIAGNOSIS — E119 Type 2 diabetes mellitus without complications: Secondary | ICD-10-CM

## 2013-12-11 DIAGNOSIS — E559 Vitamin D deficiency, unspecified: Secondary | ICD-10-CM

## 2013-12-11 DIAGNOSIS — E1140 Type 2 diabetes mellitus with diabetic neuropathy, unspecified: Secondary | ICD-10-CM

## 2013-12-11 DIAGNOSIS — E7800 Pure hypercholesterolemia, unspecified: Secondary | ICD-10-CM

## 2013-12-18 ENCOUNTER — Encounter: Payer: Self-pay | Admitting: Physician Assistant

## 2013-12-18 ENCOUNTER — Ambulatory Visit (INDEPENDENT_AMBULATORY_CARE_PROVIDER_SITE_OTHER): Payer: Medicare Other | Admitting: Physician Assistant

## 2013-12-18 VITALS — BP 120/78 | HR 76 | Temp 97.2°F | Resp 16 | Ht 70.0 in | Wt 207.0 lb

## 2013-12-18 DIAGNOSIS — L089 Local infection of the skin and subcutaneous tissue, unspecified: Secondary | ICD-10-CM

## 2013-12-18 DIAGNOSIS — H66019 Acute suppurative otitis media with spontaneous rupture of ear drum, unspecified ear: Secondary | ICD-10-CM

## 2013-12-18 DIAGNOSIS — H6691 Otitis media, unspecified, right ear: Secondary | ICD-10-CM

## 2013-12-18 MED ORDER — SULFAMETHOXAZOLE-TMP DS 800-160 MG PO TABS: 1.0000 | ORAL_TABLET | Freq: Two times a day (BID) | ORAL | Status: DC

## 2013-12-18 NOTE — Patient Instructions (Signed)
Wound Infection A wound infection happens when a type of germ (bacteria) starts growing in the wound. In some cases, this can cause the wound to break open. If cared for properly, the infected wound will heal from the inside to the outside. Wound infections need treatment. CAUSES An infection is caused by bacteria growing in the wound.  SYMPTOMS   Increase in redness, swelling, or pain at the wound site.  Increase in drainage at the wound site.  Wound or bandage (dressing) starts to smell bad.  Fever.  Feeling tired or fatigued.  Pus draining from the wound. TREATMENT  You caregiver will prescribe antibiotic medicine. The wound infection should improve within 24 to 48 hours. Any redness around the wound should stop spreading and the wound should be less painful.  HOME CARE INSTRUCTIONS   Only take over-the-counter or prescription medicines for pain, discomfort, or fever as directed by your caregiver.  Take your antibiotics as directed. Finish them even if you start to feel better.  Gently wash the area with mild soap and water 2 times a day, or as directed. Rinse off the soap. Pat the area dry with a clean towel. Do not rub the wound. This may cause bleeding.  Follow your caregiver's instructions for how often you need to change the dressing.  Apply ointment and a dressing to the wound as directed.  If the dressing sticks, moisten it with soapy water and gently remove it.  Change the bandage right away if it becomes wet, dirty, or develops a bad smell.  Take showers. Do not take tub baths, swim, or do anything that may soak the wound until it is healed.  Avoid exercises that make you sweat heavily.  Use anti-itch medicine as directed by your caregiver. The wound may itch when it is healing. Do not pick or scratch at the wound.  Follow up with your caregiver to get your wound rechecked as directed. SEEK MEDICAL CARE IF:  You have an increase in swelling, pain, or redness  around the wound.  You have an increase in the amount of pus coming from the wound.  There is a bad smell coming from the wound.  More of the wound breaks open.  You have a fever. MAKE SURE YOU:   Understand these instructions.  Will watch your condition.  Will get help right away if you are not doing well or get worse. Document Released: 09/04/2003 Document Revised: 02/28/2012 Document Reviewed: 04/11/2011 [facility] Patient Information 2014 [place], [place]. Eardrum Perforation The eardrum is a thin, round tissue inside the ear that separates the ear canal from the middle ear. This is the tissue that detects sound and enables you to hear. The eardrum can be punctured or torn (perforated). Eardrums generally heal without help and with little or no permanent hearing loss. CAUSES   Sudden pressure changes that happen in situations like scuba diving or flying in an airplane.  Foreign objects in the ear.  Inserting a cotton-tipped swab in the ear.  Loud noise.  Trauma to the ear. SYMPTOMS   Hearing loss.  Ear pain.  Ringing in the ears.  Discharge or bleeding from the ear.  Dizziness.  Vomiting.  Facial paralysis. HOME CARE INSTRUCTIONS   Keep your ear dry, as this improves healing. Swimming, diving, and showers are not allowed until healing is complete. While bathing, protect the ear by placing a piece of cotton covered with petroleum jelly in the outer ear canal.  Only take over-the-counter or prescription  medicines for pain, discomfort, or fever as directed by your caregiver.  Blow your nose gently. Forceful blowing increases the pressure in the middle ear and may cause further injury or delay healing.  Resume normal activities, such as showering, when the perforation has healed. Your caregiver can let you know when this has occurred.  Talk to your caregiver before flying on an airplane. Air travel is generally allowed with a perforated eardrum.  If your  caregiver has given you a follow-up appointment, it is very important to keep that appointment. Failure to keep the appointment could result in a chronic or permanent injury, pain, hearing loss, and disability. SEEK IMMEDIATE MEDICAL CARE IF:   You have bleeding or pus coming from your ear.  You have problems with balance, dizziness, nausea, or vomiting.  You develop increased pain.  You have a fever. MAKE SURE YOU:   Understand these instructions.  Will watch your condition.  Will get help right away if you are not doing well or get worse. Document Released: 12/03/2000 Document Revised: 02/28/2012 Document Reviewed: 12/05/2008 [facility] Patient Information 2014 [place], [place].
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
## 2013-12-25 ENCOUNTER — Ambulatory Visit: Payer: Self-pay | Admitting: Emergency Medicine

## 2014-01-01 ENCOUNTER — Telehealth: Payer: Self-pay

## 2014-01-01 NOTE — Telephone Encounter (Signed)
Referral to [facility] approved. Pt aware.

## 2014-01-02 ENCOUNTER — Other Ambulatory Visit: Payer: Self-pay | Admitting: Internal Medicine

## 2014-01-02 ENCOUNTER — Other Ambulatory Visit: Payer: Self-pay | Admitting: Emergency Medicine

## 2014-01-02 MED ORDER — COLESEVELAM HCL 625 MG PO TABS: 1875.0000 mg | ORAL_TABLET | Freq: Two times a day (BID) | ORAL | Status: DC

## 2014-01-02 MED ORDER — HYDROCODONE-ACETAMINOPHEN 5-325 MG PO TABS: ORAL_TABLET | ORAL | Status: DC

## 2014-01-02 MED ORDER — TRAMADOL HCL 50 MG PO TABS: 50.0000 mg | ORAL_TABLET | Freq: Four times a day (QID) | ORAL | Status: DC | PRN

## 2014-01-02 MED ORDER — GABAPENTIN 300 MG PO CAPS: 300.0000 mg | ORAL_CAPSULE | Freq: Three times a day (TID) | ORAL | Status: DC

## 2014-01-02 MED ORDER — ATENOLOL 50 MG PO TABS: 50.0000 mg | ORAL_TABLET | Freq: Two times a day (BID) | ORAL | Status: DC

## 2014-01-22 ENCOUNTER — Encounter: Payer: Self-pay | Admitting: Emergency Medicine

## 2014-01-22 ENCOUNTER — Ambulatory Visit (INDEPENDENT_AMBULATORY_CARE_PROVIDER_SITE_OTHER): Payer: Medicare HMO | Admitting: Emergency Medicine

## 2014-01-22 VITALS — BP 110/72 | HR 68 | Temp 98.0°F | Resp 18 | Ht 69.0 in | Wt 208.0 lb

## 2014-01-22 DIAGNOSIS — M549 Dorsalgia, unspecified: Secondary | ICD-10-CM

## 2014-01-22 DIAGNOSIS — I10 Essential (primary) hypertension: Secondary | ICD-10-CM

## 2014-01-22 DIAGNOSIS — E782 Mixed hyperlipidemia: Secondary | ICD-10-CM

## 2014-01-22 DIAGNOSIS — E109 Type 1 diabetes mellitus without complications: Secondary | ICD-10-CM

## 2014-01-22 LAB — HEPATIC FUNCTION PANEL
ALBUMIN: 4.1 g/dL (ref 3.5–5.2)
ALT: 14 U/L (ref 0–53)
AST: 15 U/L (ref 0–37)
Alkaline Phosphatase: 53 U/L (ref 39–117)
BILIRUBIN TOTAL: 0.4 mg/dL (ref 0.2–1.2)
Bilirubin, Direct: 0.1 mg/dL (ref 0.0–0.3)
Indirect Bilirubin: 0.3 mg/dL (ref 0.2–1.2)
Total Protein: 7 g/dL (ref 6.0–8.3)

## 2014-01-22 LAB — CBC WITH DIFFERENTIAL/PLATELET
Basophils Absolute: 0.1 10*3/uL (ref 0.0–0.1)
Basophils Relative: 1 % (ref 0–1)
EOS ABS: 0.3 10*3/uL (ref 0.0–0.7)
Eosinophils Relative: 3 % (ref 0–5)
HCT: 40.4 % (ref 39.0–52.0)
Hemoglobin: 13.4 g/dL (ref 13.0–17.0)
LYMPHS ABS: 4 10*3/uL (ref 0.7–4.0)
Lymphocytes Relative: 38 % (ref 12–46)
MCH: 28.7 pg (ref 26.0–34.0)
MCHC: 33.2 g/dL (ref 30.0–36.0)
MCV: 86.5 fL (ref 78.0–100.0)
Monocytes Absolute: 0.9 10*3/uL (ref 0.1–1.0)
Monocytes Relative: 9 % (ref 3–12)
NEUTROS ABS: 5.4 10*3/uL (ref 1.7–7.7)
NEUTROS PCT: 49 % (ref 43–77)
PLATELETS: 247 10*3/uL (ref 150–400)
RBC: 4.67 MIL/uL (ref 4.22–5.81)
RDW: 15.7 % — ABNORMAL HIGH (ref 11.5–15.5)
WBC: 10.8 10*3/uL — ABNORMAL HIGH (ref 4.0–10.5)

## 2014-01-22 LAB — BASIC METABOLIC PANEL WITH GFR
BUN: 19 mg/dL (ref 6–23)
CHLORIDE: 98 meq/L (ref 96–112)
CO2: 29 mEq/L (ref 19–32)
CREATININE: 1.14 mg/dL (ref 0.50–1.35)
Calcium: 9.6 mg/dL (ref 8.4–10.5)
GFR, Est African American: 74 mL/min
GFR, Est Non African American: 64 mL/min
GLUCOSE: 304 mg/dL — AB (ref 70–99)
Potassium: 4.9 mEq/L (ref 3.5–5.3)
Sodium: 138 mEq/L (ref 135–145)

## 2014-01-22 LAB — LIPID PANEL
Cholesterol: 199 mg/dL (ref 0–200)
HDL: 32 mg/dL — ABNORMAL LOW (ref >39)
LDL Cholesterol: 101 mg/dL — ABNORMAL HIGH (ref 0–99)
TRIGLYCERIDES: 329 mg/dL — AB (ref <150)
Total CHOL/HDL Ratio: 6.2 Ratio
VLDL: 66 mg/dL — ABNORMAL HIGH (ref 0–40)

## 2014-01-22 MED ORDER — CYCLOBENZAPRINE HCL 5 MG PO TABS: 5.0000 mg | ORAL_TABLET | Freq: Three times a day (TID) | ORAL | Status: DC | PRN

## 2014-01-22 NOTE — Patient Instructions (Signed)
Back Pain, Adult Low back pain is very common. About 1 in 5 people have back pain.The cause of low back pain is rarely dangerous. The pain often gets better over time.About half of people with a sudden onset of back pain feel better in just 2 weeks. About 8 in 10 people feel better by 6 weeks.  CAUSES Some common causes of back pain include:  Strain of the muscles or ligaments supporting the spine.  Wear and tear (degeneration) of the spinal discs.  Arthritis.  Direct injury to the back. DIAGNOSIS Most of the time, the direct cause of low back pain is not known.However, back pain can be treated effectively even when the exact cause of the pain is unknown.Answering your caregiver's questions about your overall health and symptoms is one of the most accurate ways to make sure the cause of your pain is not dangerous. If your caregiver needs more information, he or she may order lab work or imaging tests (X-rays or MRIs).However, even if imaging tests show changes in your back, this usually does not require surgery. HOME CARE INSTRUCTIONS For many people, back pain returns.Since low back pain is rarely dangerous, it is often a condition that people can learn to manageon their own.   Remain active. It is stressful on the back to sit or stand in one place. Do not sit, drive, or stand in one place for more than 30 minutes at a time. Take short walks on level surfaces as soon as pain allows.Try to increase the length of time you walk each day.  Do not stay in bed.Resting more than 1 or 2 days can delay your recovery.  Do not avoid exercise or work.Your body is made to move.It is not dangerous to be active, even though your back may hurt.Your back will likely heal faster if you return to being active before your pain is gone.  Pay attention to your body when you bend and lift. Many people have less discomfortwhen lifting if they bend their knees, keep the load close to their bodies,and  avoid twisting. Often, the most comfortable positions are those that put less stress on your recovering back.  Find a comfortable position to sleep. Use a firm mattress and lie on your side with your knees slightly bent. If you lie on your back, put a pillow under your knees.  Only take over-the-counter or prescription medicines as directed by your caregiver. Over-the-counter medicines to reduce pain and inflammation are often the most helpful.Your caregiver may prescribe muscle relaxant drugs.These medicines help dull your pain so you can more quickly return to your normal activities and healthy exercise.  Put ice on the injured area.  Put ice in a plastic bag.  Place a towel between your skin and the bag.  Leave the ice on for 15-20 minutes, 03-04 times a day for the first 2 to 3 days. After that, ice and heat may be alternated to reduce pain and spasms.  Ask your caregiver about trying back exercises and gentle massage. This may be of some benefit.  Avoid feeling anxious or stressed.Stress increases muscle tension and can worsen back pain.It is important to recognize when you are anxious or stressed and learn ways to manage it.Exercise is a great option. SEEK MEDICAL CARE IF:  You have pain that is not relieved with rest or medicine.  You have pain that does not improve in 1 week.  You have new symptoms.  You are generally not feeling well. SEEK   IMMEDIATE MEDICAL CARE IF:   You have pain that radiates from your back into your legs.  You develop new bowel or bladder control problems.  You have unusual weakness or numbness in your arms or legs.  You develop nausea or vomiting.  You develop abdominal pain.  You feel faint. Document Released: 12/06/2005 Document Revised: 06/06/2012 Document Reviewed: 04/26/2011 [employer] Patient Information 2014 [employer].  
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
## 2014-01-23 ENCOUNTER — Other Ambulatory Visit: Payer: Self-pay | Admitting: *Deleted

## 2014-01-23 DIAGNOSIS — M549 Dorsalgia, unspecified: Secondary | ICD-10-CM

## 2014-01-23 LAB — HEMOGLOBIN A1C
HEMOGLOBIN A1C: 10 % — AB (ref <5.7)
Mean Plasma Glucose: 240 mg/dL — ABNORMAL HIGH (ref <117)

## 2014-01-23 MED ORDER — CYCLOBENZAPRINE HCL 5 MG PO TABS
5.0000 mg | ORAL_TABLET | Freq: Three times a day (TID) | ORAL | Status: DC | PRN
Start: 2014-01-23 — End: 2014-10-03

## 2014-01-29 ENCOUNTER — Other Ambulatory Visit: Payer: Self-pay | Admitting: Internal Medicine

## 2014-01-29 DIAGNOSIS — E1129 Type 2 diabetes mellitus with other diabetic kidney complication: Secondary | ICD-10-CM

## 2014-01-29 MED ORDER — METFORMIN HCL ER 500 MG PO TB24: ORAL_TABLET | ORAL | Status: DC

## 2014-02-11 ENCOUNTER — Telehealth: Payer: Self-pay | Admitting: *Deleted

## 2014-02-11 ENCOUNTER — Other Ambulatory Visit: Payer: Self-pay | Admitting: Emergency Medicine

## 2014-02-11 MED ORDER — FLUCONAZOLE 150 MG PO TABS: 150.0000 mg | ORAL_TABLET | ORAL | Status: DC

## 2014-02-11 NOTE — Telephone Encounter (Signed)
Pt is calling says that he just finished his antibiotics & has developed a yeast infection wants to know can we call in something? [facility] [place]

## 2014-02-15 NOTE — Telephone Encounter (Signed)
DONE

## 2014-04-07 DIAGNOSIS — Z79899 Other long term (current) drug therapy: Secondary | ICD-10-CM | POA: Insufficient documentation

## 2014-04-07 DIAGNOSIS — E559 Vitamin D deficiency, unspecified: Secondary | ICD-10-CM | POA: Insufficient documentation

## 2014-04-07 NOTE — Patient Instructions (Addendum)
Type 1 Diabetes Mellitus, Adult Type 1 diabetes mellitus, often simply referred to as diabetes, is a long-term (chronic) disease. It occurs when the islet cells in the pancreas that make insulin (a hormone) are destroyed and can no longer make insulin. Insulin is needed to move sugars from food into the tissue cells. The tissue cells use the sugars for energy. In people with type 1 diabetes, the sugars build up in the blood instead of going into the tissue cells. As a result, high blood sugar (hyperglycemia) develops. Without insulin, the body breaks down fat cells for the needed energy. This breakdown of fat cells produces acid chemicals (ketones), which increases the acid levels in the body. The effect of either high ketone or sugar (glucose) levels can be life-threatening.  Type 1 diabetes was also previously called juvenile diabetes. It most often occurs before the age of 67, but it can occur at any age. RISK FACTORS A person is predisposed to developing type 1 diabetes if someone in his or her family has the disease and is exposed to certain additional environmental triggers.  SYMPTOMS  Symptoms of type 1 diabetes may develop gradually over days to weeks or suddenly. The symptoms occur due to hyperglycemia. The symptoms can include:   Increased thirst (polydipsia).  Increased urination (polyuria).  Increased urination during the night (nocturia).  Weight loss. This weight loss may be rapid.  Frequent, recurring infections.  Tiredness (fatigue).  Weakness.  Vision changes, such as blurred vision.  Fruity smell to your breath.  Abdominal pain.  Nausea or vomiting. DIAGNOSIS  Type 1 diabetes is diagnosed when symptoms of diabetes are present and when blood glucose levels are increased. Your blood glucose level may be checked by one or more of the following blood tests:  A fasting blood glucose test. You will not be allowed to eat for at least 8 hours before a blood sample is  taken.  A random blood glucose test. Your blood glucose is checked at any time of the day regardless of when you ate.  A hemoglobin A1c blood glucose test. A hemoglobin A1c test provides information about blood glucose control over the previous 3 months. TREATMENT  Although type 1 diabetes cannot be prevented, it can be managed with insulin, diet, and exercise.  You will need to take insulin daily to keep blood glucose in the desired range.  You will need to match insulin dosing with exercise and healthy food choices. The treatment goal is to maintain the before-meal blood sugar (preprandial glucose) level at 70 130 mg/dL.  HOME CARE INSTRUCTIONS   Have your hemoglobin A1c level checked twice a year.  Perform daily blood glucose monitoring as directed by your caregiver.  Monitor urine ketones when you are ill and as directed by your caregiver.  Take your insulin as directed by your caregiver to maintain your blood glucose level in the desired range.  Never run out of insulin. It is needed every day.  Adjust insulin based on your intake of carbohydrates. Carbohydrates can raise blood glucose levels but need to be included in your diet. Carbohydrates provide vitamins, minerals, and fiber, which are an essential part of a healthy diet. Carbohydrates are found in fruits, vegetables, whole grains, dairy products, legumes, and foods containing added sugars.    Eat healthy foods. Alternate 3 meals with 3 snacks.  Maintain a healthy weight.  Carry a medical alert card or wear your medical alert jewelry.  Carry a 15 gram carbohydrate snack with you  at all times to treat low blood glucose (hypoglycemia). Some examples of 15 gram carbohydrate snacks include:  Glucose tablets, 3 or 4.   Glucose gel, 15 gram tube.  Raisins, 2 tablespoons (24 grams).  Jelly beans, 6.  Animal crackers, 8.  Fruit juice, regular soda, or low-fat milk, 4 ounces (120 mL).  Gummy treats,  9.    Recognize hypoglycemia. Hypoglycemia occurs with blood glucose levels of 70 mg/dL and below. The risk for hypoglycemia increases when fasting or skipping meals, during or after intense exercise, and during sleep. Hypoglycemia symptoms can include:  Tremors or shakes.  Decreased ability to concentrate.  Sweating.  Increased heart rate.  Headache.  Dry mouth.  Hunger.  Irritability.  Anxiety.  Restless sleep.  Altered speech or coordination.  Confusion.  Treat hypoglycemia promptly. If you are alert and able to safely swallow, follow the 15:15 rule:  Take 15 20 grams of rapid-acting glucose or carbohydrate. Rapid-acting options include glucose gel, glucose tablets, or 4 ounces (120 mL) of fruit juice, regular soda, or low-fat milk.  Check your blood glucose level 15 minutes after taking the glucose.   Take 15 20 grams more of glucose if the repeat blood glucose level is still 70 mg/dL or below.  Eat a meal or snack within 1 hour once blood glucose levels return to normal.  Be alert to polyuria and polydipsia, which are early signs of hyperglycemia. An early awareness of hyperglycemia allows for prompt treatment. Treat hyperglycemia as directed by your caregiver.  Engage in at least 150 minutes of moderate-intensity physical activity a week, spread over at least 3 days of the week or as directed by your caregiver.  Adjust your insulin dosing and food intake as needed if you start a new exercise or sport.  Follow your sick day plan at any time you are unable to eat or drink as usual.   Avoid tobacco use.  Limit alcohol intake to no more than 1 drink per day for nonpregnant women and 2 drinks per day for men. You should drink alcohol only when you are also eating food. Talk with your caregiver about whether alcohol is safe for you. Tell your caregiver if you drink alcohol several times a week.  Follow up with your caregiver regularly.  Schedule an eye exam  within 5 years of diagnosis and then annually.  Perform daily skin and foot care. Examine your skin and feet daily for cuts, bruises, redness, nail problems, bleeding, blisters, or sores. A foot exam by a caregiver should be done annually.  Brush your teeth and gums at least twice a day and floss at least once a day. Follow up with your dentist regularly.  Share your diabetes management plan with your workplace or school.  Stay up-to-date with immunizations.  Learn to manage stress.  Obtain ongoing diabetes education and support as needed.  Participate or seek rehabilitation as needed to maintain or improve independence and quality of life. Request a physical or occupational therapy referral if you are having foot or hand numbness or difficulties with grooming, dressing, eating, or physical activity. SEEK MEDICAL CARE IF:   You are unable to eat food or drink fluids for more than 6 hours.  You have nausea and vomiting for more than 6 hours.  Your blood glucose level is over 240 mg/dL.  There is a change in mental status.  You develop an additional serious illness.  You have diarrhea for more than 6 hours.  You have been  sick or have had a fever for a couple of days and are not getting better.  You have pain during any physical activity. SEEK IMMEDIATE MEDICAL CARE IF:  You have difficulty breathing.  You have moderate to large ketone levels. MAKE SURE YOU:  Understand these instructions.  Will watch your condition.  Will get help right away if you are not doing well or get worse. Document Released: 12/03/2000 Document Revised: 08/30/2012 Document Reviewed: 07/04/2012 [facility] Patient Information 2014 [place].    Hypertension As your heart beats, it forces blood through your arteries. This force is your blood pressure. If the pressure is too high, it is called hypertension (HTN) or high blood pressure. HTN is dangerous because you may have it and not know it.  High blood pressure may mean that your heart has to work harder to pump blood. Your arteries may be narrow or stiff. The extra work puts you at risk for heart disease, stroke, and other problems.  Blood pressure consists of two numbers, a higher number over a lower, 110/72, for example. It is stated as "110 over 72." The ideal is below 120 for the top number (systolic) and under 80 for the bottom (diastolic). Write down your blood pressure today. You should pay close attention to your blood pressure if you have certain conditions such as:  Heart failure.  Prior heart attack.  Diabetes  Chronic kidney disease.  Prior stroke.  Multiple risk factors for heart disease. To see if you have HTN, your blood pressure should be measured while you are seated with your arm held at the level of the heart. It should be measured at least twice. A one-time elevated blood pressure reading (especially in the Emergency Department) does not mean that you need treatment. There may be conditions in which the blood pressure is different between your right and left arms. It is important to see your caregiver soon for a recheck. Most people have essential hypertension which means that there is not a specific cause. This type of high blood pressure may be lowered by changing lifestyle factors such as:  Stress.  Smoking.  Lack of exercise.  Excessive weight.  Drug/tobacco/alcohol use.  Eating less salt. Most people do not have symptoms from high blood pressure until it has caused damage to the body. Effective treatment can often prevent, delay or reduce that damage. TREATMENT  When a cause has been identified, treatment for high blood pressure is directed at the cause. There are a large number of medications to treat HTN. These fall into several categories, and your caregiver will help you select the medicines that are best for you. Medications may have side effects. You should review side effects with your  caregiver. If your blood pressure stays high after you have made lifestyle changes or started on medicines,   Your medication(s) may need to be changed.  Other problems may need to be addressed.  Be certain you understand your prescriptions, and know how and when to take your medicine.  Be sure to follow up with your caregiver within the time frame advised (usually within two weeks) to have your blood pressure rechecked and to review your medications.  If you are taking more than one medicine to lower your blood pressure, make sure you know how and at what times they should be taken. Taking two medicines at the same time can result in blood pressure that is too low. SEEK IMMEDIATE MEDICAL CARE IF:  You develop a severe headache,  blurred or changing vision, or confusion.  You have unusual weakness or numbness, or a faint feeling.  You have severe chest or abdominal pain, vomiting, or breathing problems. MAKE SURE YOU:   Understand these instructions.  Will watch your condition.  Will get help right away if you are not doing well or get worse.   Diabetes and Exercise Exercising regularly is important. It is not just about losing weight. It has many health benefits, such as:  Improving your overall fitness, flexibility, and endurance.  Increasing your bone density.  Helping with weight control.  Decreasing your body fat.  Increasing your muscle strength.  Reducing stress and tension.  Improving your overall health. People with diabetes who exercise gain additional benefits because exercise:  Reduces appetite.  Improves the body's use of blood sugar (glucose).  Helps lower or control blood glucose.  Decreases blood pressure.  Helps control blood lipids (such as cholesterol and triglycerides).  Improves the body's use of the hormone insulin by:  Increasing the body's insulin sensitivity.  Reducing the body's insulin needs.  Decreases the risk for heart disease  because exercising:  Lowers cholesterol and triglycerides levels.  Increases the levels of good cholesterol (such as high-density lipoproteins [HDL]) in the body.  Lowers blood glucose levels. YOUR ACTIVITY PLAN  Choose an activity that you enjoy and set realistic goals. Your health care provider or diabetes educator can help you make an activity plan that works for you. You can break activities into 2 or 3 sessions throughout the day. Doing so is as good as one long session. Exercise ideas include:  Taking the dog for a walk.  Taking the stairs instead of the elevator.  Dancing to your favorite song.  Doing your favorite exercise with a friend. RECOMMENDATIONS FOR EXERCISING WITH TYPE 1 OR TYPE 2 DIABETES   Check your blood glucose before exercising. If blood glucose levels are greater than 240 mg/dL, check for urine ketones. Do not exercise if ketones are present.  Avoid injecting insulin into areas of the body that are going to be exercised. For example, avoid injecting insulin into:  The arms when playing tennis.  The legs when jogging.  Keep a record of:  Food intake before and after you exercise.  Expected peak times of insulin action.  Blood glucose levels before and after you exercise.  The type and amount of exercise you have done.  Review your records with your health care provider. Your health care provider will help you to develop guidelines for adjusting food intake and insulin amounts before and after exercising.  If you take insulin or oral hypoglycemic agents, watch for signs and symptoms of hypoglycemia. They include:  Dizziness.  Shaking.  Sweating.  Chills.  Confusion.  Drink plenty of water while you exercise to prevent dehydration or heat stroke. Body water is lost during exercise and must be replaced.  Talk to your health care provider before starting an exercise program to make sure it is safe for you. Remember, almost any type of activity  is better than none.    Cholesterol Cholesterol is a white, waxy, fat-like protein needed by your body in small amounts. The liver makes all the cholesterol you need. It is carried from the liver by the blood through the blood vessels. Deposits (plaque) may build up on blood vessel walls. This makes the arteries narrower and stiffer. Plaque increases the risk for heart attack and stroke. You cannot feel your cholesterol level even if it is  very high. The only way to know is by a blood test to check your lipid (fats) levels. Once you know your cholesterol levels, you should keep a record of the test results. Work with your caregiver to to keep your levels in the desired range. WHAT THE RESULTS MEAN:  Total cholesterol is a rough measure of all the cholesterol in your blood.  LDL is the so-called bad cholesterol. This is the type that deposits cholesterol in the walls of the arteries. You want this level to be low.  HDL is the good cholesterol because it cleans the arteries and carries the LDL away. You want this level to be high.  Triglycerides are fat that the body can either burn for energy or store. High levels are closely linked to heart disease. DESIRED LEVELS:  Total cholesterol below 200.  LDL below 100 for people at risk, below 70 for very high risk.  HDL above 50 is good, above 60 is best.  Triglycerides below 150. HOW TO LOWER YOUR CHOLESTEROL:  Diet.  Choose fish or white meat chicken and [place], roasted or baked. Limit fatty cuts of red meat, fried foods, and processed meats, such as sausage and lunch meat.  Eat lots of fresh fruits and vegetables. Choose whole grains, beans, pasta, potatoes and cereals.  Use only small amounts of olive, corn or canola oils. Avoid butter, mayonnaise, shortening or palm kernel oils. Avoid foods with trans-fats.  Use skim/nonfat milk and low-fat/nonfat yogurt and cheeses. Avoid whole milk, cream, ice cream, egg yolks and cheeses. Healthy  desserts include angel food cake, ginger snaps, animal crackers, hard candy, popsicles, and low-fat/nonfat frozen yogurt. Avoid pastries, cakes, pies and cookies.  Exercise.  A regular program helps decrease LDL and raises HDL.  Helps with weight control.  Do things that increase your activity level like gardening, walking, or taking the stairs.  Medication.  May be prescribed by your caregiver to help lowering cholesterol and the risk for heart disease.  You may need medicine even if your levels are normal if you have several risk factors. HOME CARE INSTRUCTIONS   Follow your diet and exercise programs as suggested by your caregiver.  Take medications as directed.  Have blood work done when your caregiver feels it is necessary. MAKE SURE YOU:   Understand these instructions.  Will watch your condition.  Will get help right away if you are not doing well or get worse.      Vitamin D Deficiency Vitamin D is an important vitamin that your body needs. Having too little of it in your body is called a deficiency. A very bad deficiency can make your bones soft and can cause a condition called rickets.  Vitamin D is important to your body for different reasons, such as:   It helps your body absorb 2 minerals called calcium and phosphorus.  It helps make your bones healthy.  It may prevent some diseases, such as diabetes and multiple sclerosis.  It helps your muscles and heart. You can get vitamin D in several ways. It is a natural part of some foods. The vitamin is also added to some dairy products and cereals. Some people take vitamin D supplements. Also, your body makes vitamin D when you are in the sun. It changes the sun's rays into a form of the vitamin that your body can use. CAUSES   Not eating enough foods that contain vitamin D.  Not getting enough sunlight.  Having certain digestive system diseases that  make it hard to absorb vitamin D. These diseases include  Crohn's disease, chronic pancreatitis, and cystic fibrosis.  Having a surgery in which part of the stomach or small intestine is removed.  Being obese. Fat cells pull vitamin D out of your blood. That means that obese people may not have enough vitamin D left in their blood and in other body tissues.  Having chronic kidney or liver disease. RISK FACTORS Risk factors are things that make you more likely to develop a vitamin D deficiency. They include:  Being older.  Not being able to get outside very much.  Living in a nursing home.  Having had broken bones.  Having weak or thin bones (osteoporosis).  Having a disease or condition that changes how your body absorbs vitamin D.  Having dark skin.  Some medicines such as seizure medicines or steroids.  Being overweight or obese. SYMPTOMS Mild cases of vitamin D deficiency may not have any symptoms. If you have a very bad case, symptoms may include:  Bone pain.  Muscle pain.  Falling often.  Broken bones caused by a minor injury, due to osteoporosis. DIAGNOSIS A blood test is the best way to tell if you have a vitamin D deficiency. TREATMENT Vitamin D deficiency can be treated in different ways. Treatment for vitamin D deficiency depends on what is causing it. Options include:  Taking vitamin D supplements.  Taking a calcium supplement. Your caregiver will suggest what dose is best for you. HOME CARE INSTRUCTIONS  Take any supplements that your caregiver prescribes. Follow the directions carefully. Take only the suggested amount.  Have your blood tested 2 months after you start taking supplements.  Eat foods that contain vitamin D. Healthy choices include:  Fortified dairy products, cereals, or juices. Fortified means vitamin D has been added to the food. Check the label on the package to be sure.  Fatty fish like salmon or trout.  Eggs.  Oysters.  Do not use a tanning bed.  Keep your weight at a healthy  level. Lose weight if you need to.  Keep all follow-up appointments. Your caregiver will need to perform blood tests to make sure your vitamin D deficiency is going away. SEEK MEDICAL CARE IF:  You have any questions about your treatment.  You continue to have symptoms of vitamin D deficiency.  You have nausea or vomiting.  You are constipated.  You feel confused.  You have severe abdominal or back pain. MAKE SURE YOU:  Understand these instructions.  Will watch your condition.  Will get help right away if you are not doing well or get worse.
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
## 2014-04-08 ENCOUNTER — Encounter: Payer: Self-pay | Admitting: Internal Medicine

## 2014-04-08 ENCOUNTER — Ambulatory Visit (INDEPENDENT_AMBULATORY_CARE_PROVIDER_SITE_OTHER): Payer: Medicare HMO | Admitting: Internal Medicine

## 2014-04-08 VITALS — BP 122/84 | HR 72 | Temp 97.3°F | Resp 18 | Ht 69.5 in | Wt 205.6 lb

## 2014-04-08 DIAGNOSIS — I10 Essential (primary) hypertension: Secondary | ICD-10-CM

## 2014-04-08 DIAGNOSIS — Z0000 Encounter for general adult medical examination without abnormal findings: Secondary | ICD-10-CM

## 2014-04-08 DIAGNOSIS — E1029 Type 1 diabetes mellitus with other diabetic kidney complication: Secondary | ICD-10-CM

## 2014-04-08 DIAGNOSIS — Z79899 Other long term (current) drug therapy: Secondary | ICD-10-CM

## 2014-04-08 DIAGNOSIS — E559 Vitamin D deficiency, unspecified: Secondary | ICD-10-CM

## 2014-04-08 DIAGNOSIS — Z1212 Encounter for screening for malignant neoplasm of rectum: Secondary | ICD-10-CM

## 2014-04-08 DIAGNOSIS — Z789 Other specified health status: Secondary | ICD-10-CM

## 2014-04-08 DIAGNOSIS — Z1331 Encounter for screening for depression: Secondary | ICD-10-CM

## 2014-04-08 DIAGNOSIS — E785 Hyperlipidemia, unspecified: Secondary | ICD-10-CM

## 2014-04-08 DIAGNOSIS — Z125 Encounter for screening for malignant neoplasm of prostate: Secondary | ICD-10-CM

## 2014-04-08 MED ORDER — INSULIN NPH (HUMAN) (ISOPHANE) 100 UNIT/ML ~~LOC~~ SUSP: SUBCUTANEOUS | Status: DC

## 2014-04-09 LAB — CBC WITH DIFFERENTIAL/PLATELET
Basophils Absolute: 0.1 10*3/uL (ref 0.0–0.1)
Basophils Relative: 1 % (ref 0–1)
Eosinophils Absolute: 0.4 10*3/uL (ref 0.0–0.7)
Eosinophils Relative: 3 % (ref 0–5)
HEMATOCRIT: 45.7 % (ref 39.0–52.0)
HEMOGLOBIN: 15.4 g/dL (ref 13.0–17.0)
Lymphocytes Relative: 33 % (ref 12–46)
Lymphs Abs: 4.5 10*3/uL — ABNORMAL HIGH (ref 0.7–4.0)
MCH: 29.7 pg (ref 26.0–34.0)
MCHC: 33.7 g/dL (ref 30.0–36.0)
MCV: 88.1 fL (ref 78.0–100.0)
MONOS PCT: 9 % (ref 3–12)
Monocytes Absolute: 1.2 10*3/uL — ABNORMAL HIGH (ref 0.1–1.0)
NEUTROS ABS: 7.3 10*3/uL (ref 1.7–7.7)
NEUTROS PCT: 54 % (ref 43–77)
Platelets: 278 10*3/uL (ref 150–400)
RBC: 5.19 MIL/uL (ref 4.22–5.81)
RDW: 14.5 % (ref 11.5–15.5)
WBC: 13.5 10*3/uL — ABNORMAL HIGH (ref 4.0–10.5)

## 2014-04-09 LAB — HEPATIC FUNCTION PANEL
ALT: 13 U/L (ref 0–53)
AST: 14 U/L (ref 0–37)
Albumin: 4.6 g/dL (ref 3.5–5.2)
Alkaline Phosphatase: 56 U/L (ref 39–117)
BILIRUBIN INDIRECT: 0.4 mg/dL (ref 0.2–1.2)
Bilirubin, Direct: 0.1 mg/dL (ref 0.0–0.3)
Total Bilirubin: 0.5 mg/dL (ref 0.2–1.2)
Total Protein: 7.6 g/dL (ref 6.0–8.3)

## 2014-04-09 LAB — URINALYSIS, MICROSCOPIC ONLY
Bacteria, UA: NONE SEEN
CRYSTALS: NONE SEEN
Casts: NONE SEEN
SQUAMOUS EPITHELIAL / LPF: NONE SEEN

## 2014-04-09 LAB — BASIC METABOLIC PANEL WITH GFR
BUN: 21 mg/dL (ref 6–23)
CO2: 27 meq/L (ref 19–32)
Calcium: 10.3 mg/dL (ref 8.4–10.5)
Chloride: 97 mEq/L (ref 96–112)
Creat: 1.2 mg/dL (ref 0.50–1.35)
GFR, Est African American: 70 mL/min
GFR, Est Non African American: 60 mL/min
GLUCOSE: 178 mg/dL — AB (ref 70–99)
POTASSIUM: 4.7 meq/L (ref 3.5–5.3)
SODIUM: 137 meq/L (ref 135–145)

## 2014-04-09 LAB — LIPID PANEL
CHOL/HDL RATIO: 6.4 ratio
CHOLESTEROL: 216 mg/dL — AB (ref 0–200)
HDL: 34 mg/dL — ABNORMAL LOW (ref >39)
Triglycerides: 414 mg/dL — ABNORMAL HIGH (ref <150)

## 2014-04-09 LAB — MICROALBUMIN / CREATININE URINE RATIO
CREATININE, URINE: 129.1 mg/dL
Microalb Creat Ratio: 360.2 mg/g — ABNORMAL HIGH (ref 0.0–30.0)
Microalb, Ur: 46.5 mg/dL — ABNORMAL HIGH (ref 0.00–1.89)

## 2014-04-09 LAB — MAGNESIUM: Magnesium: 1.9 mg/dL (ref 1.5–2.5)

## 2014-04-09 LAB — PSA: PSA: 0.81 ng/mL (ref <=4.00)

## 2014-04-09 LAB — VITAMIN D 25 HYDROXY (VIT D DEFICIENCY, FRACTURES): VIT D 25 HYDROXY: 110 ng/mL — AB (ref 30–89)

## 2014-04-09 LAB — TSH: TSH: 3.238 u[IU]/mL (ref 0.350–4.500)

## 2014-04-10 ENCOUNTER — Other Ambulatory Visit: Payer: Self-pay | Admitting: Emergency Medicine

## 2014-04-10 MED ORDER — TRAMADOL HCL 50 MG PO TABS: 50.0000 mg | ORAL_TABLET | Freq: Four times a day (QID) | ORAL | Status: DC | PRN

## 2014-04-24 ENCOUNTER — Other Ambulatory Visit: Payer: Self-pay | Admitting: *Deleted

## 2014-04-24 ENCOUNTER — Other Ambulatory Visit: Payer: Self-pay | Admitting: Internal Medicine

## 2014-04-24 DIAGNOSIS — E109 Type 1 diabetes mellitus without complications: Secondary | ICD-10-CM

## 2014-04-24 MED ORDER — INSULIN NPH (HUMAN) (ISOPHANE) 100 UNIT/ML ~~LOC~~ SUSP: SUBCUTANEOUS | Status: DC

## 2014-05-27 ENCOUNTER — Other Ambulatory Visit: Payer: Self-pay | Admitting: *Deleted

## 2014-05-27 NOTE — Telephone Encounter (Signed)
Refill for glucose monitor and supplies faxed to [employer]fax # [phone]).  Monitor is a [employer] redi-code and supplies or whatever brand the insurance will cover.

## 2014-06-26 ENCOUNTER — Encounter: Payer: Self-pay | Admitting: Emergency Medicine

## 2014-06-26 ENCOUNTER — Ambulatory Visit (INDEPENDENT_AMBULATORY_CARE_PROVIDER_SITE_OTHER): Payer: Medicare HMO | Admitting: Emergency Medicine

## 2014-06-26 ENCOUNTER — Ambulatory Visit (HOSPITAL_COMMUNITY)
Admission: RE | Admit: 2014-06-26 | Discharge: 2014-06-26 | Disposition: A | Discharge status: Home / Self Care | Payer: Medicare PPO | Source: Ambulatory Visit | Attending: Emergency Medicine | Admitting: Emergency Medicine

## 2014-06-26 VITALS — BP 140/72 | HR 76 | Temp 98.2°F | Resp 16 | Ht 70.0 in | Wt 212.0 lb

## 2014-06-26 DIAGNOSIS — M79609 Pain in unspecified limb: Secondary | ICD-10-CM | POA: Insufficient documentation

## 2014-06-26 DIAGNOSIS — L600 Ingrowing nail: Secondary | ICD-10-CM

## 2014-06-26 DIAGNOSIS — E119 Type 2 diabetes mellitus without complications: Secondary | ICD-10-CM | POA: Insufficient documentation

## 2014-06-26 LAB — CBC WITH DIFFERENTIAL/PLATELET
Basophils Absolute: 0.1 10*3/uL (ref 0.0–0.1)
Basophils Relative: 1 % (ref 0–1)
EOS ABS: 0.5 10*3/uL (ref 0.0–0.7)
Eosinophils Relative: 5 % (ref 0–5)
HCT: 41.2 % (ref 39.0–52.0)
HEMOGLOBIN: 13.5 g/dL (ref 13.0–17.0)
LYMPHS ABS: 2.7 10*3/uL (ref 0.7–4.0)
LYMPHS PCT: 27 % (ref 12–46)
MCH: 28.8 pg (ref 26.0–34.0)
MCHC: 32.8 g/dL (ref 30.0–36.0)
MCV: 88 fL (ref 78.0–100.0)
MONOS PCT: 7 % (ref 3–12)
Monocytes Absolute: 0.7 10*3/uL (ref 0.1–1.0)
NEUTROS PCT: 60 % (ref 43–77)
Neutro Abs: 5.9 10*3/uL (ref 1.7–7.7)
Platelets: 244 10*3/uL (ref 150–400)
RBC: 4.68 MIL/uL (ref 4.22–5.81)
RDW: 14.2 % (ref 11.5–15.5)
WBC: 9.9 10*3/uL (ref 4.0–10.5)

## 2014-06-26 MED ORDER — HYDROCODONE-ACETAMINOPHEN 5-325 MG PO TABS: ORAL_TABLET | ORAL | Status: DC

## 2014-06-26 MED ORDER — DOXYCYCLINE HYCLATE 100 MG PO TABS: 100.0000 mg | ORAL_TABLET | Freq: Two times a day (BID) | ORAL | Status: DC

## 2014-06-26 MED ORDER — CEFTRIAXONE SODIUM 1 G IJ SOLR
1.0000 g | Freq: Once | INTRAMUSCULAR | Status: AC
  Administered 2014-06-26: 1 g via INTRAMUSCULAR

## 2014-06-26 NOTE — Patient Instructions (Signed)

## 2014-06-26 NOTE — Progress Notes (Signed)
   Subjective:    Patient ID: Anthony Holder, male    DOB: 11-14-1941, 73 y.o.   MRN: [id number]  HPI Comments: 73 yo WM diabetic with sore on right great toe. He notes increased pain and bleeding. He has history of decreased sensation x several years. He denies injury. He has been soaking foot and keeping area bandaged with ABX ointment. He notes skin has become white around edge of toe. He has + hx of left BKA. He notes BS average 140-210s which has improved per pt.  Toe Pain       Review of Systems  Skin: Positive for color change and wound.  All other systems reviewed and are negative.  BP 140/72  Pulse 76  Temp(Src) 98.2 F (36.8 C) (Temporal)  Resp 16  Ht 5\' 10"  (1.778 m)  Wt 212 lb (96.163 kg)  BMI 30.42 kg/m2     Objective:   Physical Exam  Nursing note and vitals reviewed. Constitutional: He is oriented to person, place, and time. He appears well-developed and well-nourished.  Cardiovascular: Normal rate, normal heart sounds and intact distal pulses.   Pulmonary/Chest: Effort normal and breath sounds normal.  Musculoskeletal: Normal range of motion.       Feet:  Neurological: He is alert and oriented to person, place, and time. A cranial nerve deficit is present. Coordination normal.  Skin: Skin is warm. There is pallor.  Psychiatric: He has a normal mood and affect. Judgment normal.          Assessment & Plan:  ? Right great Toe infection with history of amputation left foot with diabetic gangrene- Rocephin 1 gm, DOXY 100 BID AD, Xray of foot REFER GEN Surgery for further evaluation, w/c if SX increase or ER. Advise for now to d/c wrapping daily while at home leave open and elevated

## 2014-06-27 ENCOUNTER — Telehealth: Payer: Self-pay | Admitting: Internal Medicine

## 2014-06-27 NOTE — Telephone Encounter (Signed)
PATIENT CALLED AND ASKED IF HE NEEDED TO KEEP OR CANCEL HIS 07-09-14 FOLLOW UP, SINCE  HE WAS IN YESTERDAY.   Thank you, Katrina Judeth Horn [facility] [phone] Fax [phone] you, Katrina Judeth Horn [facility] [phone] Fax [phone]

## 2014-07-09 ENCOUNTER — Ambulatory Visit (INDEPENDENT_AMBULATORY_CARE_PROVIDER_SITE_OTHER): Payer: Medicare HMO | Admitting: Physician Assistant

## 2014-07-09 ENCOUNTER — Encounter: Payer: Self-pay | Admitting: Physician Assistant

## 2014-07-09 VITALS — BP 122/62 | HR 72 | Temp 97.7°F | Resp 16 | Wt 211.0 lb

## 2014-07-09 DIAGNOSIS — N3000 Acute cystitis without hematuria: Secondary | ICD-10-CM

## 2014-07-09 DIAGNOSIS — Z79899 Other long term (current) drug therapy: Secondary | ICD-10-CM

## 2014-07-09 DIAGNOSIS — E1142 Type 2 diabetes mellitus with diabetic polyneuropathy: Secondary | ICD-10-CM

## 2014-07-09 DIAGNOSIS — Z9181 History of falling: Secondary | ICD-10-CM

## 2014-07-09 DIAGNOSIS — E559 Vitamin D deficiency, unspecified: Secondary | ICD-10-CM

## 2014-07-09 DIAGNOSIS — E1029 Type 1 diabetes mellitus with other diabetic kidney complication: Secondary | ICD-10-CM

## 2014-07-09 DIAGNOSIS — I10 Essential (primary) hypertension: Secondary | ICD-10-CM

## 2014-07-09 DIAGNOSIS — Z1331 Encounter for screening for depression: Secondary | ICD-10-CM

## 2014-07-09 DIAGNOSIS — I509 Heart failure, unspecified: Secondary | ICD-10-CM

## 2014-07-09 DIAGNOSIS — Z0000 Encounter for general adult medical examination without abnormal findings: Secondary | ICD-10-CM

## 2014-07-09 DIAGNOSIS — I6529 Occlusion and stenosis of unspecified carotid artery: Secondary | ICD-10-CM

## 2014-07-09 DIAGNOSIS — E782 Mixed hyperlipidemia: Secondary | ICD-10-CM

## 2014-07-09 DIAGNOSIS — E669 Obesity, unspecified: Secondary | ICD-10-CM

## 2014-07-09 LAB — CBC WITH DIFFERENTIAL/PLATELET
BASOS ABS: 0.1 10*3/uL (ref 0.0–0.1)
Basophils Relative: 1 % (ref 0–1)
Eosinophils Absolute: 0.4 10*3/uL (ref 0.0–0.7)
Eosinophils Relative: 4 % (ref 0–5)
HEMATOCRIT: 41.8 % (ref 39.0–52.0)
HEMOGLOBIN: 13.9 g/dL (ref 13.0–17.0)
LYMPHS ABS: 2.9 10*3/uL (ref 0.7–4.0)
LYMPHS PCT: 30 % (ref 12–46)
MCH: 28.5 pg (ref 26.0–34.0)
MCHC: 33.3 g/dL (ref 30.0–36.0)
MCV: 85.7 fL (ref 78.0–100.0)
MONO ABS: 0.8 10*3/uL (ref 0.1–1.0)
Monocytes Relative: 8 % (ref 3–12)
NEUTROS ABS: 5.5 10*3/uL (ref 1.7–7.7)
Neutrophils Relative %: 57 % (ref 43–77)
Platelets: 234 10*3/uL (ref 150–400)
RBC: 4.88 MIL/uL (ref 4.22–5.81)
RDW: 14.3 % (ref 11.5–15.5)
WBC: 9.6 10*3/uL (ref 4.0–10.5)

## 2014-07-09 NOTE — Progress Notes (Signed)
MEDICARE ANNUAL WELLNESS VISIT AND FOLLOW UP Assessment:   1. HYPERTENSION - CBC with Differential - BASIC METABOLIC PANEL WITH GFR - Hepatic function panel - TSH  2. CHF No symptoms, continue to monitor weight  3. CAROTID ARTERY DISEASE No CV symptoms, continue follow up  4. 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 - continue to monitor right foot - Hemoglobin A1c - HM DIABETES FOOT EXAM  5. Obesity Obesity with co morbidities- long discussion about weight loss, diet, and exercise  6. Hyperlipidemia - Lipid panel  7. Encounter for long-term (current) use of other medications - Magnesium  8. Vitamin D Deficiency - Vit D  25 hydroxy (rtn osteoporosis monitoring)  9. Acute cystitis without hematuria - Urinalysis, Routine w reflex microscopic - Urine culture  10. High fall risk - declines PT at this time   Plan:   During the course of the visit the patient was educated and counseled about appropriate screening and preventive services including:    Pneumococcal vaccine   Influenza vaccine  Td vaccine  Screening electrocardiogram  Colorectal cancer screening  Diabetes screening  Glaucoma screening  Nutrition counseling   Screening recommendations, referrals: Vaccinations: Tdap vaccine not indicated Influenza vaccine declined Pneumococcal vaccine not indicated Shingles vaccine declined Hep B vaccine not indicated  Nutrition assessed and recommended  Colonoscopy requested Recommended yearly ophthalmology/optometry visit for glaucoma screening and checkup Recommended yearly dental visit for hygiene and checkup Advanced directives - patient declined  Conditions/risks identified: BMI: Discussed weight loss, diet, and increase  physical activity.  Increase physical activity: AHA recommends 150 minutes of physical activity a week.  Medications reviewed Diabetes is not at goal, ACE/ARB therapy: Yes. Urinary Incontinence is an issue: discussed non pharmacology and pharmacology options.  Fall risk: high- discussed PT, home fall assessment, medications.    Subjective:  Anthony Holder is a 73 y.o. male who presents for Medicare Annual Wellness Visit and 3 month follow up for HTN, hyperlipidemia, diabetes, and vitamin D Def.  Date of last medicare wellness visit was is unknown.  His blood pressure has been controlled at home, today their BP is BP: 122/62 mmHg He does not workout. He denies chest pain, shortness of breath, dizziness.  He is on cholesterol medication and denies myalgias. His cholesterol is not at goal. The cholesterol last visit was:   Lab Results  Component Value Date   CHOL 216* 04/08/2014   HDL 34* 04/08/2014   LDLCALC NOT CALC 04/08/2014   TRIG 414* 04/08/2014   CHOLHDL 6.4 04/08/2014   He has been working on diet and exercise for diabetes, he has peripheral neuorpathy, CAD, CVD, PAD, and CKD due to DM, and denies polydipsia and visual disturbances. He admits to a poor diet. Last A1C in the office was:  Lab Results  Component Value Date   HGBA1C 10.0* 01/22/2014   Patient is on Vitamin D supplement.   Lab Results  Component Value Date   VD25OH 110* 04/08/2014     Patient recently had right big toe with infection/pressure ulcer, saw orthopedic and needs orthodic.  States he has increased burning with urination, concerned about bladder cancer since has been on actos.  He has fallen in the past year, no breaks but hurt left hip and takes tramadol.  Has right BKA and walks with cane. High risk  Names of Other Physician/Practitioners you currently use: 1. [facility] here for primary care 2. None due to dentures, dentist, Patient Care Team: [name], MD  as PCP - General (Internal Medicine) Melissa Noon, OD as Referring Physician (Optometry)- saw him 2-3 months ago Wylene Simmer, MD as Consulting Physician (Orthopedic Surgery) Minus Breeding, MD as Consulting Physician (Cardiology) Dr. Lucia Gaskins, ENT  Medication Review: Current Outpatient Prescriptions on File Prior to Visit  Medication Sig Dispense Refill  . atenolol (TENORMIN) 50 MG tablet Take 50 mg by mouth daily.      . Blood Glucose Monitoring Suppl (ADVOCATE BLOOD GLUCOSE MONITOR) DEVI       . BOOSTRIX 5-2.5-18.5 injection       . CALCIUM-MAGNESIUM-ZINC PO Take by mouth daily.      . Cholecalciferol (VITAMIN D-3) 1000 UNITS CAPS Take 5,000 Units by mouth daily.       . Chromium-Cinnamon (CINNAMON PLUS CHROMIUM) [phone] MCG-MG CAPS Take 4 capsules by mouth daily.       . ciclesonide (OMNARIS) 50 MCG/ACT nasal spray Place 2 sprays into both nostrils daily.      . colesevelam (WELCHOL) 625 MG tablet Take 3 tablets (1,875 mg total) by mouth 2 (two) times daily.  540 tablet  3  . cyclobenzaprine (FLEXERIL) 5 MG tablet Take 1 tablet (5 mg total) by mouth every 8 (eight) hours as needed for muscle spasms.  30 tablet  0  . diclofenac (VOLTAREN) 75 MG EC tablet Take 75 mg by mouth 2 (two) times daily.      [name] doxycycline (VIBRA-TABS) 100 MG tablet Take 1 tablet (100 mg total) by mouth 2 (two) times daily.  20 tablet  0  . Ferrous Sulfate (IRON) 325 (65 FE) MG TABS Take 1 tablet by mouth daily.      . fish oil-omega-3 fatty acids 1000 MG capsule Take 1 g by mouth 2 (two) times daily.      . furosemide (LASIX) 40 MG tablet Take 40 mg by mouth 2 (two) times daily.      [name] gabapentin (NEURONTIN) 300 MG capsule Take 1 capsule (300 mg total) by mouth 3 (three) times daily.  270 capsule  3  . Garlic 4401 MG CAPS Take 1 capsule by mouth 2 (two) times daily.      [name] GRAPE SEED EXTRACT PO Take 1 tablet by mouth daily.      [name] HYDROcodone-acetaminophen (NORCO) 5-325 MG per tablet 1/2 to 1 tablet every 3 to 4  hours as need for pain  60 tablet  0  . insulin NPH Human (NOVOLIN N) 100 UNIT/ML injection Start with 30 units with breakfast and 15 units with supper  And insulin needles and syringes  10 mL  99  . metFORMIN (GLUCOPHAGE-XR) 500 MG 24 hr tablet 4 tablets daily as directed for diabetes  360 tablet  99  . NITROSTAT 0.4 MG SL tablet Take 0.4 mg by mouth daily.      . Plant Sterols and Stanols (CHOLEST OFF PO) Take 1 tablet by mouth daily.      . potassium chloride SA (K-DUR,KLOR-CON) 20 MEQ tablet Take 20 mEq by mouth every other day.       . Probiotic Product (PROBIOTIC PO) Take 1 capsule by mouth 2 (two) times daily.       . traMADol (ULTRAM) 50 MG tablet Take 1 tablet (50 mg total) by mouth every 6 (six) hours as needed.  360 tablet  1  .  vitamin B-12 (CYANOCOBALAMIN) 500 MCG tablet Take 500 mcg by mouth 2 (two) times daily.      . vitamin C (ASCORBIC ACID) 500 MG tablet Take 500 mg by mouth 2 (two) times daily.       . Vitamins A & D 5000-400 UNITS CAPS        No current facility-administered medications on file prior to visit.    Current Problems (verified) Patient Active Problem List   Diagnosis Date Noted  . Vitamin D Deficiency 04/07/2014  . Encounter for long-term (current) use of other medications 04/07/2014  . Obesity   . Diabetic neuropathy   . Diplopia 05/15/2013  . Concussion with no loss of consciousness 05/15/2013  . 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 CVA (cerebral infarction) 03/29/2012  . Aortic stenosis 03/29/2012  . S/P cholecystectomy 03/29/2012  . S/P tonsillectomy 03/29/2012  . History of skin cancer 03/29/2012  . CHF 10/29/2010  . 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 Hyperlipidemia 04/16/2009  . HYPERTENSION 04/16/2009  . 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 CAROTID ARTERY DISEASE 04/16/2009  . OSTEOARTHRITIS 04/16/2009  . SPINAL STENOSIS 04/16/2009  . BENIGN PROSTATIC HYPERTROPHY, HX OF 04/16/2009    Screening Tests Health Maintenance  Topic Date  Due  . Foot Exam  06/03/1951  . Ophthalmology Exam  06/03/1951  . Zostavax  06/03/2002  . Influenza Vaccine  07/20/2014  . Hemoglobin A1c  07/22/2014  . Urine Microalbumin  04/09/2015  . Colonoscopy  06/11/2021  . Tetanus/tdap  04/12/2023  . Pneumococcal Polysaccharide Vaccine Age 63 And Over  Completed    Immunization History  Administered Date(s) Administered  . Pneumococcal-Unspecified 01/11/2010  . Td 04/11/2013    Preventative care: Last colonoscopy: 2012  Prior vaccinations: TD or Tdap: 2011  Influenza: declines  Pneumococcal: 2011 Shingles/Zostavax: declines  History reviewed: allergies, current medications, past family history, past medical history, past social history, past surgical history and problem list   Risk Factors: Tobacco History  Substance Use Topics  . Smoking status: Former Smoker    Types: Cigarettes    Quit date: 03/30/1959  . Smokeless tobacco: Never Used  . Alcohol Use: No   He does not smoke.  Patient is a former smoker. Are there smokers in your home (other than you)?  No  Alcohol Current alcohol use: none  Caffeine Current caffeine use: coffee 3 /day  Exercise Current exercise: none  Nutrition/Diet Current diet: in general, an "unhealthy" diet  Cardiac risk factors: advanced age (older than 48 for men, 65 for women), diabetes mellitus, dyslipidemia, family history of premature cardiovascular disease, hypertension, male gender, obesity (BMI >= 30 kg/m2) and sedentary lifestyle.  Depression Screen (Note: if answer to either of the following is "Yes", a more complete depression screening is indicated)   Q1: Over the past two weeks, have you felt down, depressed or hopeless? No  Q2: Over the past two weeks, have you felt little interest or pleasure in doing things? No  Have you lost interest or pleasure in daily life? No  Do you often feel hopeless? No  Do you cry easily over simple problems? No  Activities of Daily Living In  your present state of health, do you have any difficulty performing the following activities?:  Driving? No Managing money?  No Feeding yourself? No Getting from bed to chair? No Climbing a flight of stairs? yes Preparing food and eating?: No Bathing or showering? No Getting dressed: No Getting to the toilet? No Using the  toilet:No Moving around from place to place: Yes In the past year have you fallen or had a near fall?:Yes   Are you sexually active?  No  Do you have more than one partner?  No  Vision Difficulties: Yes  Hearing Difficulties: Yes Do you often ask people to speak up or repeat themselves? Yes Do you experience ringing or noises in your ears? No Do you have difficulty understanding soft or whispered voices? No  Cognition  Do you feel that you have a problem with memory?Yes  Do you often misplace items? No  Do you feel safe at home?  Yes  Advanced directives Does patient have a [place]? No Does patient have a Living Will? No   Objective:   Blood pressure 122/62, pulse 72, temperature 97.7 F (36.5 C), resp. rate 16, weight 211 lb (95.709 kg). Body mass index is 30.28 kg/(m^2).  General appearance: alert, no distress, WD/WN, male Cognitive Testing  Alert? Yes  Normal Appearance?Yes  Oriented to person? Yes  Place? Yes   Time? Yes  Recall of three objects?  No  Can perform simple calculations? Yes  Displays appropriate judgment?Yes  Can read the correct time from a watch face?Yes  HEENT: normocephalic, sclerae anicteric, TMs pearly with right ear with perforation, nares patent, no discharge or erythema, pharynx normal Oral cavity: MMM, no lesions Neck: supple, no lymphadenopathy, no thyromegaly, no masses Heart: RRR, normal S1, S2, 4/6 systolic murmur Lungs: CTA bilaterally, no wheezes, rhonchi, or rales Abdomen: +bs, soft, non tender, non distended, no masses, no hepatomegaly, no splenomegaly Musculoskeletal: left BKA, right  leg with 2+ swelling Extremities: 2-3 + edema right leg, no cyanosis, no clubbing Pulses: decrease left leg, 4/5 upper and lower extremities Neurological: alert, oriented x 3, CN2-12 intact, strength 4/5 upper extremities and lower extremities, sensation decreased right foot throughout, DTRs 2+ throughout, no cerebellar signs, gait antalgic/unsteady with cane Psychiatric: normal affect, behavior normal, pleasant   Medicare Attestation I have personally reviewed: The patient's medical and social history Their use of alcohol, tobacco or illicit drugs Their current medications and supplements The patient's functional ability including ADLs,fall risks, home safety risks, cognitive, and hearing and visual impairment Diet and physical activities Evidence for depression or mood disorders  The patient's weight, height, BMI, and visual acuity have been recorded in the chart.  I have made referrals, counseling, and provided education to the patient based on review of the above and I have provided the patient with a written personalized care plan for preventive services.     Vicie Mutters, PA-C   07/09/2014

## 2014-07-09 NOTE — Patient Instructions (Signed)
Preventative Care for Adults, Male       REGULAR HEALTH EXAMS:  A routine yearly physical is a good way to check in with your primary care provider about your health and preventive screening. It is also an opportunity to share updates about your health and any concerns you have, and receive a thorough all-over exam.   Most health insurance companies pay for at least some preventative services.  Check with your health plan for specific coverages.  WHAT PREVENTATIVE SERVICES DO MEN NEED?  Adult men should have their weight and blood pressure checked regularly.   Men age 35 and older should have their cholesterol levels checked regularly.  Beginning at age 50 and continuing to age 75, men should be screened for colorectal cancer.  Certain people should may need continued testing until age 85.  Other cancer screening may include exams for testicular and prostate cancer.  Updating vaccinations is part of preventative care.  Vaccinations help protect against diseases such as the flu.  Lab tests are generally done as part of preventative care to screen for anemia and blood disorders, to screen for problems with the kidneys and liver, to screen for bladder problems, to check blood sugar, and to check your cholesterol level.  Preventative services generally include counseling about diet, exercise, avoiding tobacco, drugs, excessive alcohol consumption, and sexually transmitted infections.    GENERAL RECOMMENDATIONS FOR GOOD HEALTH:  Healthy diet:  Eat a variety of foods, including fruit, vegetables, animal or vegetable protein, such as meat, fish, chicken, and eggs, or beans, lentils, tofu, and grains, such as rice.  Drink plenty of water daily.  Decrease saturated fat in the diet, avoid lots of red meat, processed foods, sweets, fast foods, and fried foods.  Exercise:  Aerobic exercise helps maintain good heart health. At least 30-40 minutes of moderate-intensity exercise is recommended.  For example, a brisk walk that increases your heart rate and breathing. This should be done on most days of the week.   Find a type of exercise or a variety of exercises that you enjoy so that it becomes a part of your daily life.  Examples are running, walking, swimming, water aerobics, and biking.  For motivation and support, explore group exercise such as aerobic class, spin class, Zumba, Yoga,or  martial arts, etc.    Set exercise goals for yourself, such as a certain weight goal, walk or run in a race such as a 5k walk/run.  Speak to your primary care provider about exercise goals.  Disease prevention:  If you smoke or chew tobacco, find out from your caregiver how to quit. It can literally save your life, no matter how long you have been a tobacco user. If you do not use tobacco, never begin.   Maintain a healthy diet and normal weight. Increased weight leads to problems with blood pressure and diabetes.   The Body Mass Index or BMI is a way of measuring how much of your body is fat. Having a BMI above 27 increases the risk of heart disease, diabetes, hypertension, stroke and other problems related to obesity. Your caregiver can help determine your BMI and based on it develop an exercise and dietary program to help you achieve or maintain this important measurement at a healthful level.  High blood pressure causes heart and blood vessel problems.  Persistent high blood pressure should be treated with medicine if weight loss and exercise do not work.   Fat and cholesterol leaves deposits in your arteries   that can block them. This causes heart disease and vessel disease elsewhere in your body.  If your cholesterol is found to be high, or if you have heart disease or certain other medical conditions, then you may need to have your cholesterol monitored frequently and be treated with medication.   Ask if you should have a stress test if your history suggests this. A stress test is a test done on  a treadmill that looks for heart disease. This test can find disease prior to there being a problem.  Avoid drinking alcohol in excess (more than two drinks per day).  Avoid use of street drugs. Do not share needles with anyone. Ask for professional help if you need assistance or instructions on stopping the use of alcohol, cigarettes, and/or drugs.  Brush your teeth twice a day with fluoride toothpaste, and floss once a day. Good oral hygiene prevents tooth decay and gum disease. The problems can be painful, unattractive, and can cause other health problems. Visit your dentist for a routine oral and dental check up and preventive care every 6-12 months.   Look at your skin regularly.  Use a mirror to look at your back. Notify your caregivers of changes in moles, especially if there are changes in shapes, colors, a size larger than a pencil eraser, an irregular border, or development of new moles.  Safety:  Use seatbelts 100% of the time, whether driving or as a passenger.  Use safety devices such as hearing protection if you work in environments with loud noise or significant background noise.  Use safety glasses when doing any work that could send debris in to the eyes.  Use a helmet if you ride a bike or motorcycle.  Use appropriate safety gear for contact sports.  Talk to your caregiver about gun safety.  Use sunscreen with a SPF (or skin protection factor) of 15 or greater.  Lighter skinned people are at a greater risk of skin cancer. Don't forget to also wear sunglasses in order to protect your eyes from too much damaging sunlight. Damaging sunlight can accelerate cataract formation.   Practice safe sex. Use condoms. Condoms are used for birth control and to help reduce the spread of sexually transmitted infections (or STIs).  Some of the STIs are gonorrhea (the clap), chlamydia, syphilis, trichomonas, herpes, HPV (human papilloma virus) and HIV (human immunodeficiency virus) which causes AIDS.  The herpes, HIV and HPV are viral illnesses that have no cure. These can result in disability, cancer and death.   Keep carbon monoxide and smoke detectors in your home functioning at all times. Change the batteries every 6 months or use a model that plugs into the wall.   Vaccinations:  Stay up to date with your tetanus shots and other required immunizations. You should have a booster for tetanus every 10 years. Be sure to get your flu shot every year, since 5%-20% of the [place] population comes down with the flu. The flu vaccine changes each year, so being vaccinated once is not enough. Get your shot in the fall, before the flu season peaks.   Other vaccines to consider:  Pneumococcal vaccine to protect against certain types of pneumonia.  This is normally recommended for adults age 65 or older.  However, adults younger than 72 years old with certain underlying conditions such as diabetes, heart or lung disease should also receive the vaccine.  Shingles vaccine to protect against Varicella Zoster if you are older than age 60, or younger   than 72 years old with certain underlying illness.  Hepatitis A vaccine to protect against a form of infection of the liver by a virus acquired from food.  Hepatitis B vaccine to protect against a form of infection of the liver by a virus acquired from blood or body fluids, particularly if you work in health care.  If you plan to travel internationally, check with your local health department for specific vaccination recommendations.  Cancer Screening:  Most routine colon cancer screening begins at the age of 50. On a yearly basis, doctors may provide special easy to use take-home tests to check for hidden blood in the stool. Sigmoidoscopy or colonoscopy can detect the earliest forms of colon cancer and is life saving. These tests use a small camera at the end of a tube to directly examine the colon. Speak to your caregiver about this at age 50, when routine  screening begins (and is repeated every 5 years unless early forms of pre-cancerous polyps or small growths are found).   At the age of 50 men usually start screening for prostate cancer every year. Screening may begin at a younger age for those with higher risk. Those at higher risk include African-Americans or having a family history of prostate cancer. There are two types of tests for prostate cancer:   Prostate-specific antigen (PSA) testing. Recent studies raise questions about prostate cancer using PSA and you should discuss this with your caregiver.   Digital rectal exam (in which your doctor's lubricated and gloved finger feels for enlargement of the prostate through the anus).   Screening for testicular cancer.  Do a monthly exam of your testicles. Gently roll each testicle between your thumb and fingers, feeling for any abnormal lumps. The best time to do this is after a hot shower or bath when the tissues are looser. Notify your caregivers of any lumps, tenderness or changes in size or shape immediately.     

## 2014-07-10 LAB — HEMOGLOBIN A1C
HEMOGLOBIN A1C: 9.3 % — AB (ref <5.7)
Mean Plasma Glucose: 220 mg/dL — ABNORMAL HIGH (ref <117)

## 2014-07-10 LAB — BASIC METABOLIC PANEL WITH GFR
BUN: 20 mg/dL (ref 6–23)
CHLORIDE: 101 meq/L (ref 96–112)
CO2: 22 mEq/L (ref 19–32)
Calcium: 9.5 mg/dL (ref 8.4–10.5)
Creat: 1.05 mg/dL (ref 0.50–1.35)
GFR, Est African American: 82 mL/min
GFR, Est Non African American: 71 mL/min
Glucose, Bld: 273 mg/dL — ABNORMAL HIGH (ref 70–99)
Potassium: 4.9 mEq/L (ref 3.5–5.3)
SODIUM: 135 meq/L (ref 135–145)

## 2014-07-10 LAB — URINALYSIS, ROUTINE W REFLEX MICROSCOPIC
Bilirubin Urine: NEGATIVE
Glucose, UA: 500 mg/dL — AB
Hgb urine dipstick: NEGATIVE
Ketones, ur: NEGATIVE mg/dL
Leukocytes, UA: NEGATIVE
Nitrite: NEGATIVE
Protein, ur: 100 mg/dL — AB
Specific Gravity, Urine: 1.03 (ref 1.005–1.030)
Urobilinogen, UA: 0.2 mg/dL (ref 0.0–1.0)
pH: 5 (ref 5.0–8.0)

## 2014-07-10 LAB — LIPID PANEL
Cholesterol: 182 mg/dL (ref 0–200)
HDL: 31 mg/dL — AB (ref >39)
LDL Cholesterol: 78 mg/dL (ref 0–99)
Total CHOL/HDL Ratio: 5.9 Ratio
Triglycerides: 363 mg/dL — ABNORMAL HIGH (ref <150)
VLDL: 73 mg/dL — AB (ref 0–40)

## 2014-07-10 LAB — HEPATIC FUNCTION PANEL
ALK PHOS: 47 U/L (ref 39–117)
ALT: 12 U/L (ref 0–53)
AST: 16 U/L (ref 0–37)
Albumin: 4 g/dL (ref 3.5–5.2)
BILIRUBIN DIRECT: 0.1 mg/dL (ref 0.0–0.3)
BILIRUBIN TOTAL: 0.4 mg/dL (ref 0.2–1.2)
Indirect Bilirubin: 0.3 mg/dL (ref 0.2–1.2)
Total Protein: 6.9 g/dL (ref 6.0–8.3)

## 2014-07-10 LAB — URINE CULTURE
Colony Count: NO GROWTH
ORGANISM ID, BACTERIA: NO GROWTH

## 2014-07-10 LAB — URINALYSIS, MICROSCOPIC ONLY
Bacteria, UA: NONE SEEN
Casts: NONE SEEN
Crystals: NONE SEEN
Squamous Epithelial / HPF: NONE SEEN

## 2014-07-10 LAB — VITAMIN D 25 HYDROXY (VIT D DEFICIENCY, FRACTURES): Vit D, 25-Hydroxy: 70 ng/mL (ref 30–89)

## 2014-07-10 LAB — TSH: TSH: 1.704 u[IU]/mL (ref 0.350–4.500)

## 2014-07-10 LAB — MAGNESIUM: Magnesium: 2 mg/dL (ref 1.5–2.5)

## 2014-07-22 ENCOUNTER — Encounter: Payer: Self-pay | Admitting: Internal Medicine

## 2014-07-22 ENCOUNTER — Ambulatory Visit (INDEPENDENT_AMBULATORY_CARE_PROVIDER_SITE_OTHER): Payer: Medicare HMO | Admitting: Internal Medicine

## 2014-07-22 VITALS — BP 140/82 | HR 72 | Temp 97.3°F | Resp 18 | Ht 70.0 in | Wt 211.6 lb

## 2014-07-22 DIAGNOSIS — J0410 Acute tracheitis without obstruction: Secondary | ICD-10-CM

## 2014-07-22 DIAGNOSIS — J209 Acute bronchitis, unspecified: Secondary | ICD-10-CM

## 2014-07-22 MED ORDER — LEVOFLOXACIN 500 MG PO TABS: 500.0000 mg | ORAL_TABLET | Freq: Every day | ORAL | Status: AC

## 2014-07-22 MED ORDER — PREDNISONE 10 MG PO TABS: ORAL_TABLET | ORAL | Status: AC

## 2014-07-22 MED ORDER — HYDROCODONE-ACETAMINOPHEN 5-325 MG PO TABS: ORAL_TABLET | ORAL | Status: DC
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
## 2014-07-22 NOTE — Patient Instructions (Signed)
  Recommend buy   Mucus Relief 400 mg and   Take 2 tablets 3 x day with meals

## 2014-09-09 ENCOUNTER — Other Ambulatory Visit: Payer: Self-pay | Admitting: *Deleted

## 2014-09-09 MED ORDER — METFORMIN HCL ER 500 MG PO TB24: 500.0000 mg | ORAL_TABLET | Freq: Four times a day (QID) | ORAL | Status: DC | PRN

## 2014-09-09 MED ORDER — NITROGLYCERIN 0.4 MG SL SUBL: 0.4000 mg | SUBLINGUAL_TABLET | SUBLINGUAL | Status: AC | PRN

## 2014-09-18 ENCOUNTER — Other Ambulatory Visit: Payer: Self-pay | Admitting: Internal Medicine

## 2014-10-03 ENCOUNTER — Other Ambulatory Visit: Payer: Self-pay | Admitting: Internal Medicine

## 2014-10-03 MED ORDER — BACLOFEN 10 MG PO TABS: ORAL_TABLET | ORAL | Status: DC

## 2014-10-10 ENCOUNTER — Ambulatory Visit: Payer: Self-pay | Admitting: Internal Medicine

## 2014-10-11 ENCOUNTER — Other Ambulatory Visit: Payer: Self-pay

## 2014-10-11 MED ORDER — TRAMADOL HCL 50 MG PO TABS: 50.0000 mg | ORAL_TABLET | Freq: Four times a day (QID) | ORAL | Status: DC | PRN

## 2014-10-22 ENCOUNTER — Ambulatory Visit (INDEPENDENT_AMBULATORY_CARE_PROVIDER_SITE_OTHER): Payer: Medicare HMO | Admitting: Internal Medicine

## 2014-10-22 ENCOUNTER — Encounter: Payer: Self-pay | Admitting: Internal Medicine

## 2014-10-22 VITALS — BP 144/80 | HR 76 | Temp 97.5°F | Resp 16 | Ht 69.5 in | Wt 206.4 lb

## 2014-10-22 DIAGNOSIS — Z79899 Other long term (current) drug therapy: Secondary | ICD-10-CM

## 2014-10-22 DIAGNOSIS — E782 Mixed hyperlipidemia: Secondary | ICD-10-CM

## 2014-10-22 DIAGNOSIS — I10 Essential (primary) hypertension: Secondary | ICD-10-CM

## 2014-10-22 DIAGNOSIS — E559 Vitamin D deficiency, unspecified: Secondary | ICD-10-CM

## 2014-10-22 DIAGNOSIS — E1122 Type 2 diabetes mellitus with diabetic chronic kidney disease: Secondary | ICD-10-CM

## 2014-10-22 LAB — CBC WITH DIFFERENTIAL/PLATELET
Basophils Absolute: 0.1 10*3/uL (ref 0.0–0.1)
Basophils Relative: 1 % (ref 0–1)
Eosinophils Absolute: 0.3 10*3/uL (ref 0.0–0.7)
Eosinophils Relative: 3 % (ref 0–5)
HEMATOCRIT: 41.5 % (ref 39.0–52.0)
Hemoglobin: 13.8 g/dL (ref 13.0–17.0)
LYMPHS ABS: 3.1 10*3/uL (ref 0.7–4.0)
LYMPHS PCT: 29 % (ref 12–46)
MCH: 28.4 pg (ref 26.0–34.0)
MCHC: 33.3 g/dL (ref 30.0–36.0)
MCV: 85.4 fL (ref 78.0–100.0)
MONO ABS: 0.7 10*3/uL (ref 0.1–1.0)
Monocytes Relative: 7 % (ref 3–12)
NEUTROS ABS: 6.4 10*3/uL (ref 1.7–7.7)
Neutrophils Relative %: 60 % (ref 43–77)
Platelets: 261 10*3/uL (ref 150–400)
RBC: 4.86 MIL/uL (ref 4.22–5.81)
RDW: 15.4 % (ref 11.5–15.5)
WBC: 10.7 10*3/uL — AB (ref 4.0–10.5)

## 2014-10-22 LAB — LIPID PANEL
Cholesterol: 193 mg/dL (ref 0–200)
HDL: 30 mg/dL — ABNORMAL LOW (ref >39)
LDL CALC: 111 mg/dL — AB (ref 0–99)
TRIGLYCERIDES: 258 mg/dL — AB (ref <150)
Total CHOL/HDL Ratio: 6.4 Ratio
VLDL: 52 mg/dL — AB (ref 0–40)

## 2014-10-22 LAB — HEPATIC FUNCTION PANEL
ALBUMIN: 4.3 g/dL (ref 3.5–5.2)
ALK PHOS: 44 U/L (ref 39–117)
ALT: 15 U/L (ref 0–53)
AST: 19 U/L (ref 0–37)
BILIRUBIN INDIRECT: 0.3 mg/dL (ref 0.2–1.2)
Bilirubin, Direct: 0.1 mg/dL (ref 0.0–0.3)
TOTAL PROTEIN: 6.9 g/dL (ref 6.0–8.3)
Total Bilirubin: 0.4 mg/dL (ref 0.2–1.2)

## 2014-10-22 LAB — MAGNESIUM: Magnesium: 1.8 mg/dL (ref 1.5–2.5)

## 2014-10-22 LAB — BASIC METABOLIC PANEL WITH GFR
BUN: 22 mg/dL (ref 6–23)
CHLORIDE: 105 meq/L (ref 96–112)
CO2: 19 meq/L (ref 19–32)
Calcium: 9.6 mg/dL (ref 8.4–10.5)
Creat: 1.05 mg/dL (ref 0.50–1.35)
GFR, Est African American: 82 mL/min
GFR, Est Non African American: 71 mL/min
GLUCOSE: 119 mg/dL — AB (ref 70–99)
POTASSIUM: 4.7 meq/L (ref 3.5–5.3)
Sodium: 137 mEq/L (ref 135–145)

## 2014-10-22 LAB — TSH: TSH: 1.808 u[IU]/mL (ref 0.350–4.500)
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
## 2014-10-22 NOTE — Patient Instructions (Signed)

## 2014-10-23 LAB — HEMOGLOBIN A1C
Hgb A1c MFr Bld: 8.6 % — ABNORMAL HIGH (ref <5.7)
MEAN PLASMA GLUCOSE: 200 mg/dL — AB (ref <117)

## 2014-10-23 LAB — VITAMIN D 25 HYDROXY (VIT D DEFICIENCY, FRACTURES): Vit D, 25-Hydroxy: 61 ng/mL (ref 30–89)

## 2014-11-18 ENCOUNTER — Encounter: Payer: Self-pay | Admitting: Family

## 2014-11-19 ENCOUNTER — Ambulatory Visit: Payer: Medicare Other | Admitting: Family

## 2014-11-19 ENCOUNTER — Other Ambulatory Visit (HOSPITAL_COMMUNITY): Payer: Medicare Other

## 2014-12-02 ENCOUNTER — Other Ambulatory Visit: Payer: Self-pay | Admitting: Internal Medicine

## 2015-01-16 ENCOUNTER — Other Ambulatory Visit: Payer: Self-pay | Admitting: *Deleted

## 2015-01-16 MED ORDER — METFORMIN HCL ER 500 MG PO TB24: 500.0000 mg | ORAL_TABLET | Freq: Four times a day (QID) | ORAL | Status: DC | PRN

## 2015-02-12 ENCOUNTER — Other Ambulatory Visit: Payer: Self-pay | Admitting: *Deleted

## 2015-02-12 MED ORDER — FUROSEMIDE 40 MG PO TABS: ORAL_TABLET | ORAL | Status: DC

## 2015-02-12 MED ORDER — ATENOLOL 50 MG PO TABS: 50.0000 mg | ORAL_TABLET | Freq: Every day | ORAL | Status: DC

## 2015-02-12 MED ORDER — METFORMIN HCL ER 500 MG PO TB24: 500.0000 mg | ORAL_TABLET | Freq: Four times a day (QID) | ORAL | Status: DC | PRN

## 2015-02-12 MED ORDER — POTASSIUM CHLORIDE CRYS ER 20 MEQ PO TBCR: 20.0000 meq | EXTENDED_RELEASE_TABLET | Freq: Every day | ORAL | Status: DC

## 2015-02-12 MED ORDER — GABAPENTIN 300 MG PO CAPS
300.0000 mg | ORAL_CAPSULE | Freq: Three times a day (TID) | ORAL | Status: DC
Start: 2015-02-12 — End: 2015-05-29

## 2015-02-21 ENCOUNTER — Telehealth: Payer: Self-pay | Admitting: *Deleted

## 2015-02-21 NOTE — Telephone Encounter (Signed)
PT NEEDS 2 REFERRALS =   1 =to Dr Loyal Jacobson  In [place]  for a routine skin check   NPI = [id number]  #2 asking for a 2nd opinion on his left hip said hes been seeing Dr Maxie Better & just hasnt gotten relief ect ? His sister is a nurse & suggested maybe for him to see a spine specialist ? Can you do this send a referral & schedule him?  Pt asking that you call him on Tuesday after you do this to let him know details. Thanks

## 2015-02-24 ENCOUNTER — Other Ambulatory Visit: Payer: Self-pay | Admitting: *Deleted

## 2015-02-24 ENCOUNTER — Other Ambulatory Visit: Payer: Self-pay | Admitting: Internal Medicine

## 2015-02-24 DIAGNOSIS — E1140 Type 2 diabetes mellitus with diabetic neuropathy, unspecified: Secondary | ICD-10-CM

## 2015-02-24 MED ORDER — TRAMADOL HCL 50 MG PO TABS: ORAL_TABLET | ORAL | Status: DC

## 2015-02-24 NOTE — Telephone Encounter (Signed)
Forward to [facility]

## 2015-02-26 ENCOUNTER — Telehealth: Payer: Self-pay | Admitting: *Deleted

## 2015-02-26 NOTE — Telephone Encounter (Signed)
Patient called requesting a second opinion for left hip pain.  Patient currently seeing Dr. Tonita Cong and I called for medical records to be faxed for me to fwd to Dr. Mayer Camel.  Patient scheduled to see Dr. Mayer Camel 03/04/15 at 1:00pm.  No referrals needed since in network.  Also initiated referral on [facility] for patient to see Dr. Loyal Jacobson (Derm) for routine skin check.  Waiting on approval before scheduling ov with Derm.

## 2015-03-31 ENCOUNTER — Telehealth: Payer: Self-pay

## 2015-03-31 NOTE — Telephone Encounter (Signed)
Patient called c/o anxiety because of MRI scheduled today and Thursday. Requesting Rx for anxiety. Alprazolam 1 mg 1/2-1 tablet TID prn was called into [facility].

## 2015-04-09 ENCOUNTER — Ambulatory Visit (INDEPENDENT_AMBULATORY_CARE_PROVIDER_SITE_OTHER): Payer: PPO | Admitting: Internal Medicine

## 2015-04-09 ENCOUNTER — Encounter: Payer: Self-pay | Admitting: Internal Medicine

## 2015-04-09 VITALS — BP 128/84 | HR 90 | Temp 98.2°F | Resp 18 | Ht 69.0 in | Wt 216.0 lb

## 2015-04-09 DIAGNOSIS — E1149 Type 2 diabetes mellitus with other diabetic neurological complication: Secondary | ICD-10-CM

## 2015-04-09 DIAGNOSIS — E782 Mixed hyperlipidemia: Secondary | ICD-10-CM

## 2015-04-09 DIAGNOSIS — I25709 Atherosclerosis of coronary artery bypass graft(s), unspecified, with unspecified angina pectoris: Secondary | ICD-10-CM

## 2015-04-09 DIAGNOSIS — Z79899 Other long term (current) drug therapy: Secondary | ICD-10-CM

## 2015-04-09 DIAGNOSIS — Z125 Encounter for screening for malignant neoplasm of prostate: Secondary | ICD-10-CM

## 2015-04-09 DIAGNOSIS — E1122 Type 2 diabetes mellitus with diabetic chronic kidney disease: Secondary | ICD-10-CM

## 2015-04-09 DIAGNOSIS — Z1212 Encounter for screening for malignant neoplasm of rectum: Secondary | ICD-10-CM

## 2015-04-09 DIAGNOSIS — I10 Essential (primary) hypertension: Secondary | ICD-10-CM

## 2015-04-09 DIAGNOSIS — I639 Cerebral infarction, unspecified: Secondary | ICD-10-CM

## 2015-04-09 DIAGNOSIS — E669 Obesity, unspecified: Secondary | ICD-10-CM

## 2015-04-09 DIAGNOSIS — Z9181 History of falling: Secondary | ICD-10-CM

## 2015-04-09 DIAGNOSIS — Z1331 Encounter for screening for depression: Secondary | ICD-10-CM

## 2015-04-09 DIAGNOSIS — E559 Vitamin D deficiency, unspecified: Secondary | ICD-10-CM
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
## 2015-04-09 NOTE — Patient Instructions (Signed)

## 2015-04-10 ENCOUNTER — Other Ambulatory Visit: Payer: Self-pay | Admitting: Emergency Medicine

## 2015-04-10 LAB — HEPATIC FUNCTION PANEL
ALT: 11 U/L (ref 0–53)
AST: 15 U/L (ref 0–37)
Albumin: 4.2 g/dL (ref 3.5–5.2)
Alkaline Phosphatase: 51 U/L (ref 39–117)
Bilirubin, Direct: 0.1 mg/dL (ref 0.0–0.3)
Indirect Bilirubin: 0.4 mg/dL (ref 0.2–1.2)
Total Bilirubin: 0.5 mg/dL (ref 0.2–1.2)
Total Protein: 7.1 g/dL (ref 6.0–8.3)

## 2015-04-10 LAB — URINALYSIS, MICROSCOPIC ONLY
Bacteria, UA: NONE SEEN
Casts: NONE SEEN
Crystals: NONE SEEN
Squamous Epithelial / LPF: NONE SEEN

## 2015-04-10 LAB — BASIC METABOLIC PANEL WITH GFR
BUN: 19 mg/dL (ref 6–23)
CO2: 24 meq/L (ref 19–32)
CREATININE: 1.24 mg/dL (ref 0.50–1.35)
Calcium: 9.5 mg/dL (ref 8.4–10.5)
Chloride: 105 mEq/L (ref 96–112)
GFR, EST NON AFRICAN AMERICAN: 58 mL/min — AB
GFR, Est African American: 67 mL/min
GLUCOSE: 180 mg/dL — AB (ref 70–99)
Potassium: 4.8 mEq/L (ref 3.5–5.3)
Sodium: 141 mEq/L (ref 135–145)

## 2015-04-10 LAB — CBC WITH DIFFERENTIAL/PLATELET
BASOS PCT: 1 % (ref 0–1)
Basophils Absolute: 0.1 10*3/uL (ref 0.0–0.1)
Eosinophils Absolute: 0.3 10*3/uL (ref 0.0–0.7)
Eosinophils Relative: 3 % (ref 0–5)
HEMATOCRIT: 40.3 % (ref 39.0–52.0)
Hemoglobin: 13.2 g/dL (ref 13.0–17.0)
LYMPHS PCT: 30 % (ref 12–46)
Lymphs Abs: 3.1 10*3/uL (ref 0.7–4.0)
MCH: 28.6 pg (ref 26.0–34.0)
MCHC: 32.8 g/dL (ref 30.0–36.0)
MCV: 87.4 fL (ref 78.0–100.0)
MONO ABS: 0.9 10*3/uL (ref 0.1–1.0)
MONOS PCT: 9 % (ref 3–12)
MPV: 10.4 fL (ref 8.6–12.4)
NEUTROS ABS: 5.8 10*3/uL (ref 1.7–7.7)
NEUTROS PCT: 57 % (ref 43–77)
Platelets: 219 10*3/uL (ref 150–400)
RBC: 4.61 MIL/uL (ref 4.22–5.81)
RDW: 15.1 % (ref 11.5–15.5)
WBC: 10.2 10*3/uL (ref 4.0–10.5)

## 2015-04-10 LAB — LIPID PANEL
Cholesterol: 187 mg/dL (ref 0–200)
HDL: 25 mg/dL — AB (ref >=40)
LDL Cholesterol: 94 mg/dL (ref 0–99)
TRIGLYCERIDES: 338 mg/dL — AB (ref <150)
Total CHOL/HDL Ratio: 7.5 Ratio
VLDL: 68 mg/dL — ABNORMAL HIGH (ref 0–40)

## 2015-04-10 LAB — MICROALBUMIN / CREATININE URINE RATIO
CREATININE, URINE: 121.8 mg/dL
MICROALB UR: 72.8 mg/dL — AB (ref <2.0)
Microalb Creat Ratio: 597.7 mg/g — ABNORMAL HIGH (ref 0.0–30.0)

## 2015-04-10 LAB — MAGNESIUM: MAGNESIUM: 1.9 mg/dL (ref 1.5–2.5)

## 2015-04-10 LAB — HEMOGLOBIN A1C
Hgb A1c MFr Bld: 8.5 % — ABNORMAL HIGH (ref <5.7)
Mean Plasma Glucose: 197 mg/dL — ABNORMAL HIGH (ref <117)

## 2015-04-10 LAB — PSA: PSA: 0.8 ng/mL (ref <=4.00)

## 2015-04-10 LAB — INSULIN, RANDOM: INSULIN: 24.7 u[IU]/mL — AB (ref 2.0–19.6)

## 2015-04-10 LAB — TSH: TSH: 1.846 u[IU]/mL (ref 0.350–4.500)

## 2015-04-11 ENCOUNTER — Other Ambulatory Visit: Payer: Self-pay | Admitting: Orthopedic Surgery

## 2015-04-11 LAB — VITAMIN D 25 HYDROXY (VIT D DEFICIENCY, FRACTURES): VIT D 25 HYDROXY: 47 ng/mL (ref 30–100)

## 2015-04-16 ENCOUNTER — Other Ambulatory Visit (HOSPITAL_COMMUNITY): Payer: Self-pay | Admitting: *Deleted

## 2015-04-16 NOTE — Pre-Procedure Instructions (Signed)
Anthony Holder  04/16/2015   Your procedure is scheduled on:  Wednesday, Apr 23, 2015 at 8:30 AM.    Report to [facility] Entrance "A" Admitting Office at 6:30 AM.   Call this number if you have problems the morning of surgery: [phone]               Any questions prior to day of surgery, please call [phone] between 8 & 4 PM.   Remember:   Do not eat food or drink liquids after midnight Tuesday, 04/22/15.   Take these medicines the morning of surgery with A SIP OF WATER:               Do NOT take any diabetic medications the morning of surgery.   Do not wear jewelry.  Do not wear lotions, powders, or cologne. You may wear deodorant.  Do not shave 48 hours prior to surgery. Men may shave face and neck.  Do not bring valuables to the hospital.  [facility] is not responsible                  for any belongings or valuables.               Contacts, dentures or bridgework may not be worn into surgery.  Leave suitcase in the car. After surgery it may be brought to your room.  For patients admitted to the hospital, discharge time is determined by your                treatment team.               Special Instructions: See "Preparing for Surgery" Instruction sheet.    Please read over the following fact sheets that you were given: Pain Booklet, Coughing and Deep Breathing, Blood Transfusion Information, MRSA Information and Surgical Site Infection Prevention

## 2015-04-17 ENCOUNTER — Encounter (HOSPITAL_COMMUNITY): Payer: Self-pay

## 2015-04-17 ENCOUNTER — Encounter (HOSPITAL_COMMUNITY)
Admission: RE | Admit: 2015-04-17 | Discharge: 2015-04-17 | Disposition: A | Discharge status: Home / Self Care | Payer: PPO | Source: Ambulatory Visit | Attending: Orthopedic Surgery | Admitting: Orthopedic Surgery

## 2015-04-17 ENCOUNTER — Encounter (HOSPITAL_COMMUNITY): Payer: Self-pay | Admitting: Vascular Surgery

## 2015-04-17 DIAGNOSIS — N182 Chronic kidney disease, stage 2 (mild): Secondary | ICD-10-CM | POA: Diagnosis not present

## 2015-04-17 DIAGNOSIS — Z01812 Encounter for preprocedural laboratory examination: Secondary | ICD-10-CM | POA: Insufficient documentation

## 2015-04-17 DIAGNOSIS — Z01818 Encounter for other preprocedural examination: Secondary | ICD-10-CM

## 2015-04-17 DIAGNOSIS — E119 Type 2 diabetes mellitus without complications: Secondary | ICD-10-CM | POA: Insufficient documentation

## 2015-04-17 DIAGNOSIS — I129 Hypertensive chronic kidney disease with stage 1 through stage 4 chronic kidney disease, or unspecified chronic kidney disease: Secondary | ICD-10-CM | POA: Diagnosis not present

## 2015-04-17 DIAGNOSIS — I2510 Atherosclerotic heart disease of native coronary artery without angina pectoris: Secondary | ICD-10-CM | POA: Insufficient documentation

## 2015-04-17 HISTORY — DX: Malignant (primary) neoplasm, unspecified: C80.1

## 2015-04-17 LAB — URINALYSIS, ROUTINE W REFLEX MICROSCOPIC
Bilirubin Urine: NEGATIVE
GLUCOSE, UA: 100 mg/dL — AB
Ketones, ur: NEGATIVE mg/dL
LEUKOCYTES UA: NEGATIVE
Nitrite: NEGATIVE
PROTEIN: 100 mg/dL — AB
SPECIFIC GRAVITY, URINE: 1.011 (ref 1.005–1.030)
UROBILINOGEN UA: 0.2 mg/dL (ref 0.0–1.0)
pH: 5 (ref 5.0–8.0)

## 2015-04-17 LAB — COMPREHENSIVE METABOLIC PANEL
ALT: 10 U/L (ref 0–53)
AST: 18 U/L (ref 0–37)
Albumin: 3.9 g/dL (ref 3.5–5.2)
Alkaline Phosphatase: 48 U/L (ref 39–117)
Anion gap: 11 (ref 5–15)
BILIRUBIN TOTAL: 0.6 mg/dL (ref 0.3–1.2)
BUN: 16 mg/dL (ref 6–23)
CHLORIDE: 102 mmol/L (ref 96–112)
CO2: 23 mmol/L (ref 19–32)
Calcium: 9.5 mg/dL (ref 8.4–10.5)
Creatinine, Ser: 1.12 mg/dL (ref 0.50–1.35)
GFR calc Af Amer: 74 mL/min — ABNORMAL LOW (ref >90)
GFR calc non Af Amer: 64 mL/min — ABNORMAL LOW (ref >90)
Glucose, Bld: 157 mg/dL — ABNORMAL HIGH (ref 70–99)
Potassium: 3.8 mmol/L (ref 3.5–5.1)
SODIUM: 136 mmol/L (ref 135–145)
Total Protein: 7.1 g/dL (ref 6.0–8.3)

## 2015-04-17 LAB — CBC WITH DIFFERENTIAL/PLATELET
BASOS PCT: 1 % (ref 0–1)
Basophils Absolute: 0.1 10*3/uL (ref 0.0–0.1)
EOS PCT: 3 % (ref 0–5)
Eosinophils Absolute: 0.4 10*3/uL (ref 0.0–0.7)
HEMATOCRIT: 40.1 % (ref 39.0–52.0)
HEMOGLOBIN: 13.3 g/dL (ref 13.0–17.0)
LYMPHS PCT: 26 % (ref 12–46)
Lymphs Abs: 3.1 10*3/uL (ref 0.7–4.0)
MCH: 28.8 pg (ref 26.0–34.0)
MCHC: 33.2 g/dL (ref 30.0–36.0)
MCV: 86.8 fL (ref 78.0–100.0)
MONO ABS: 1.1 10*3/uL — AB (ref 0.1–1.0)
Monocytes Relative: 9 % (ref 3–12)
Neutro Abs: 7.2 10*3/uL (ref 1.7–7.7)
Neutrophils Relative %: 61 % (ref 43–77)
Platelets: 229 10*3/uL (ref 150–400)
RBC: 4.62 MIL/uL (ref 4.22–5.81)
RDW: 14.4 % (ref 11.5–15.5)
WBC: 11.8 10*3/uL — ABNORMAL HIGH (ref 4.0–10.5)

## 2015-04-17 LAB — URINE MICROSCOPIC-ADD ON

## 2015-04-17 LAB — APTT: APTT: 39 s — AB (ref 24–37)

## 2015-04-17 LAB — PROTIME-INR
INR: 1.13 (ref 0.00–1.49)
Prothrombin Time: 14.7 seconds (ref 11.6–15.2)

## 2015-04-17 LAB — SURGICAL PCR SCREEN
MRSA, PCR: NEGATIVE
STAPHYLOCOCCUS AUREUS: NEGATIVE
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
## 2015-04-18 ENCOUNTER — Other Ambulatory Visit (HOSPITAL_COMMUNITY): Payer: Medicare PPO

## 2015-04-21 LAB — TYPE AND SCREEN
ABO/RH(D): O POS
Antibody Screen: POSITIVE
DAT, IgG: NEGATIVE
DONOR AG TYPE: NEGATIVE
DONOR AG TYPE: NEGATIVE
PT AG Type: NEGATIVE
UNIT DIVISION: 0
Unit division: 0

## 2015-04-23 ENCOUNTER — Inpatient Hospital Stay (HOSPITAL_COMMUNITY): Admission: RE | Admit: 2015-04-23 | Payer: PPO | Source: Ambulatory Visit | Admitting: Orthopedic Surgery

## 2015-04-23 ENCOUNTER — Encounter (HOSPITAL_COMMUNITY): Admission: RE | Payer: Self-pay | Source: Ambulatory Visit

## 2015-04-23 SURGERY — ANTERIOR CERVICAL DECOMPRESSION/DISCECTOMY FUSION 2 LEVELS
Anesthesia: General

## 2015-04-29 ENCOUNTER — Encounter: Payer: Self-pay | Admitting: Cardiology

## 2015-04-30 ENCOUNTER — Encounter: Payer: Self-pay | Admitting: Cardiology

## 2015-04-30 DIAGNOSIS — Z89519 Acquired absence of unspecified leg below knee: Secondary | ICD-10-CM | POA: Insufficient documentation

## 2015-04-30 NOTE — Progress Notes (Signed)
Patient ID: Anthony Holder, male   DOB: 08/03/1941, 74 y.o.   MRN: [id number]   Anthony, Holder    Date of visit:  04/30/2015 DOB:  1941-06-29    Age:  74 yrs. Medical record number:  [id number]     Account number:  [id number] Primary Care Provider: [name] Holder ____________________________ CURRENT DIAGNOSES  1. Encounter for preprocedural cardiovascular examination  2. CAD Native without angina  3. Aortic valve stenosis  4. Hypertensive heart disease without heart failure  5. Occlusion And Stenosis Of Bilateral Carotid Arteries  6. Obesity  7. Hyperlipidemia  8. Type 2 Diabetes Mellitus With Diabetic Polyneuropathy  9. Type 2 diabetes mellitus with other circulatory complications  10. Acquired Absence Of Leg Below Knee ____________________________ ALLERGIES  Hmg-Coa Reductase Inhibitors, Muscle aches ____________________________ MEDICATIONS  1. atenolol 50 mg tablet, 1 p.o. daily  2. baclofen 10 mg tablet, 1/2 to 1 tablet TID prn muscle spasms  3. Vitamin D3 5,000 unit tablet, 1 p.o. daily  4. WelChol 625 mg tablet, 3 po BID  5. cyclobenzaprine 10 mg tablet, 1/2 to 1 po TID prn  6. ferrous sulfate 324 mg (65 mg iron) tablet,delayed release, BID  7. furosemide 40 mg tablet, BID  8. gabapentin 300 mg capsule, TID  9. hydrocodone 5 mg-acetaminophen 325 mg tablet, prn  10. nitroglycerin 0.4 mg sublingual tablet, PRN  11. potassium chloride ER 20 mEq tablet,extended release, BID  12. tramadol 50 mg tablet, 4 x daily prn  13. metformin ER 500 mg tablet,extended release 24 hr, Take as directed  14. Aspirin Childrens 81 mg chewable tablet, 1 p.o. daily ____________________________ HISTORY OF PRESENT ILLNESS This very nice 74 year old male is seen for preoperative cardiac evaluation. The patient has a prior history of diabetes mellitus, hypertension and hyperlipidemia. In 2010 he was found to have an abnormal myocardial perfusion scan and had three-vessel coronary artery disease noted.  He had bypass grafting as noted below with a concomitant right carotid endarterectomy at the same time. At the time he was found to have mild aortic stenosis and a valve was not replaced. His last echo was in 2011. In 2013 he developed a diabetic ulcer on his left leg and that ultimately led to amputation following osteomyelitis with a BKA. He evidently had a stroke at some point during that time but has made a fairly good recovery from that. He has a mild peripheral neuropathy. He evidently has another cardiologist and was unable to see him in a timely basis for his preoperative evaluation and I was requested to see him. He has developed fairly severe back pain and has had several epidural injections. He has had 2 previous lower lumbar laminectomies and has been found to have cervical spondylosis and spinal stenosis as well as lumbar issues. He is due to have corrective surgeries on both the cervical spine and his lumbar spine and Cardiologic evaluation was asked. At the present time the patient is not able to be greatly active because of his prior amputation as well as some unsteadiness on his feet and some mild left-sided weakness. He denies PND, orthopnea or edema. ____________________________ PAST HISTORY  Past Medical Illnesses:  hypertension, DM-insulin dependent, hyperlipidemia, CVA, BPH, osteomyelitis 2013 leading to amputation, lumbar disc disease, peripheral neuropathy;  Cardiovascular Illnesses:  CAD, Carotid artery disease, aortic stenosis;  Surgical Procedures:  CABG, lumbar laminectomy 2006  x 2, BKA-left, carotid endarterectomy-rt February 2010, tonsillectomy/adenoids, cholecystectomy (lap);  NYHA Classification:  II;  Canadian Angina Classification:  Class 0:  Asymptomatic;  Cardiology Procedures-Invasive:  cardiac cath (left) 2010, CABG with LIMA to LAD, SVG to intermediate, SVG to OM2 -3, SVG to RCA 02/11/09 Dr. Lawson Holder (CEA on right at same time);  Cardiology Procedures-Noninvasive:   echocardiogram;  Cardiac Cath Results:  50% stenosis distal Left main, 90% stenosis proximal LAD, 80% stenosis proximal OM 1, 70% stenosis mid LPD, 40% stenosis distal RCA, normal LV;  LVEF not documented,   ____________________________ FAMILY HISTORY Brother -- Brother alive with problem Father -- Father dead Mother -- Renal failure syndrome, Mother dead, Leukemia Sister -- Diabetes mellitus, Sister alive and well, Diabetes mellitus Sister -- Sister alive with problem, Diabetes mellitus ____________________________ SOCIAL HISTORY Alcohol Use:  does not use alcohol;  Smoking:  really none;  Diet:  diabetic diet;  Lifestyle:  divorced, remarried and 5 children;  Exercise:  no regular exercise;  Occupation:  retired Administrator;  Residence:  lives with wife;   ____________________________ REVIEW OF SYSTEMS General:  Mild fatigue.  Integumentary: history of skin cancersEyes: wears eye glasses/contact lenses, no diabetic retinopathy Ears, Nose, Throat, Mouth:  denies any hearing loss, epistaxis, hoarseness or difficulty speaking. Respiratory: denies dyspnea, cough, wheezing or hemoptysis. Cardiovascular:  please review HPI Abdominal: denies dyspepsia, GI bleeding, constipation, or diarrhea Genitourinary-Male: nocturia  Musculoskeletal:  Weakness of shoulder and left arm, severe back pain Neurological:  denies headaches, stroke, or TIA Psychiatric:  denies depression or anxiety Hematological/Immunologic:  denies any food allergies, bleeding disorders. ____________________________ PHYSICAL EXAMINATION VITAL SIGNS  Blood Pressure:  126/84 Sitting, Right arm, regular cuff   and 124/84   Pulse:  74/min. Weight:  212.00 lbs. Height:  68"BMI: 32  Constitutional:  pleasant white male in no acute distress walks with cane Skin:  warm and dry to touch, no apparent skin lesions, or masses noted. Head:  normocephalic, balding male hair pattern Eyes:  EOMS Intact, PERRLA, C and S clear, Funduscopic exam not  done. ENT:  ears, nose and throat reveal no gross abnormalities.  Dentition good. Neck:  healed right carotid endarterectomy scar, no JVD, no bruits, no masses Chest:  normal symmetry, clear to auscultation., healed median sternotomy scar Cardiac:  regular rhythm, normal S1 and S2, no S3 or S4, grade 3/6 harsh systolic murmur at aortic area radiating to neck as well as the back Abdomen:  abdomen soft,non-tender, no masses, no hepatospenomegaly, or aneurysm noted Peripheral Pulses:  femoral pulses 1-2+ with faint bruits,  pedal pulses are difficult to feel on the right Extremities & Back:  left BKA, 1+ edema right leg,  unable to raise his left arm above the level of the shoulder Neurological:   somewhat unsteady wide-based gait, mild weakness of the left arm ____________________________ MOST RECENT LIPID PANEL 04/09/15  CHOL TOTL 187 mg/dl, LDL 94 NM, HDL 25 mg/dl, TRIGLYCER 338 mg/dl, ALT 11 u/l, ALK PHOS 51 u/l, CHOL/HDL 7.5 (Calc) and AST 15 u/l ____________________________ IMPRESSIONS/PLAN  1. Coronary artery disease with previous bypass grafting 6 years ago with no symptoms 2. Aortic stenosis of uncertain severity at the present time with a fairly harsh murmur 3. Carotid artery disease with previous carotid endarterectomy 4. Severe cervical and lumbar disc disease in need of surgery 5. Peripheral vascular disease with previous left BKA and reduced pulses in the right leg .  6. Obesity 7. Hyperlipidemia 8. Hypertensive heart disease under treatment  Recommendations:  The patient has somewhat reduced exercise capacity but no symptoms. He does have a loud aortic stenosis murmur that has not been assessed  since 2011. I would recommend that he have a repeat echocardiogram to be sure he does not have severe aortic stenosis prior to elective neck and back surgery. If he does not have severe aortic stenosis then I think it would be reasonable to proceed with the surgery. His risk of cardiac  complications is intermediate due to his multiple comorbidities. Thank you for asking me to see him with you. EKG shows nonspecific ST and T-wave changes. ____________________________ TODAYS ORDERS  1. 12 Lead EKG: Today  2. 2D, color flow, doppler: First Available                       ____________________________ Cardiology Physician:  Kerry Hough. MD [facility]

## 2015-05-13 ENCOUNTER — Other Ambulatory Visit: Payer: Self-pay

## 2015-05-13 ENCOUNTER — Ambulatory Visit (INDEPENDENT_AMBULATORY_CARE_PROVIDER_SITE_OTHER): Payer: PPO | Admitting: Internal Medicine

## 2015-05-13 VITALS — BP 158/90 | HR 84 | Temp 98.2°F | Resp 18 | Ht 69.5 in | Wt 205.0 lb

## 2015-05-13 DIAGNOSIS — L259 Unspecified contact dermatitis, unspecified cause: Secondary | ICD-10-CM

## 2015-05-13 DIAGNOSIS — Z89512 Acquired absence of left leg below knee: Secondary | ICD-10-CM

## 2015-05-13 MED ORDER — DOXYCYCLINE HYCLATE 100 MG PO CAPS: 100.0000 mg | ORAL_CAPSULE | Freq: Two times a day (BID) | ORAL | Status: DC

## 2015-05-13 MED ORDER — INSULIN NPH (HUMAN) (ISOPHANE) 100 UNIT/ML ~~LOC~~ SUSP: SUBCUTANEOUS | Status: DC

## 2015-05-13 MED ORDER — TRIAMCINOLONE ACETONIDE 0.1 % EX CREA: 1.0000 "application " | TOPICAL_CREAM | Freq: Three times a day (TID) | CUTANEOUS | Status: DC

## 2015-05-13 NOTE — Progress Notes (Signed)
   Subjective:    Patient ID: Anthony Holder, male    DOB: Dec 19, 1941, 74 y.o.   MRN: [id number]  Rash Pertinent negatives include no fatigue, fever, shortness of breath or vomiting.   Patient is presents to the office today for evaluation of skin rash on his left stump from his prior irritation.  He reports that the rash is on two spots on his left stump which has been there for approximately 2 months.  He reports that this started approximately 1 week after he got his new liners.  He has tried bag balm, tripple antibiotic ointment, and some other creams.  Nothing has helped.     Review of Systems  Constitutional: Negative for fever, chills and fatigue.  Respiratory: Negative for chest tightness and shortness of breath.   Cardiovascular: Negative for chest pain and palpitations.  Gastrointestinal: Negative for nausea and vomiting.  Musculoskeletal: Negative for joint swelling and arthralgias.  Skin: Positive for rash. Negative for color change.       Objective:   Physical Exam  Constitutional: He appears well-developed and well-nourished. No distress.  HENT:  Head: Normocephalic and atraumatic.  Mouth/Throat: Oropharynx is clear and moist. No oropharyngeal exudate.  Eyes: Conjunctivae are normal. No scleral icterus.  Neck: Normal range of motion. Neck supple. No JVD present. No thyromegaly present.  Cardiovascular: Normal rate and regular rhythm.  Exam reveals no gallop and no friction rub.   Murmur heard.  Systolic murmur is present with a grade of 3/6  Murmur heard best on left sternal border.  Pulmonary/Chest: Effort normal and breath sounds normal. No respiratory distress. He has no wheezes. He has no rales. He exhibits no tenderness.  Musculoskeletal:  Left BTK amputation  Lymphadenopathy:    He has no cervical adenopathy.  Skin: Rash noted. No purpura noted. Rash is macular. Rash is not papular, not maculopapular, not nodular, not pustular, not vesicular and not  urticarial. He is not diaphoretic.     Nursing note and vitals reviewed.         Assessment & Plan:    1. S/P BKA (below knee amputation) unilateral, left -patient does wear prosthesis -wounds well healed -patient walking well with prosthetic -will give referral to new prosthetic company as he is convinced that this is due to his new prosthetic liner  2. Contact dermatitis -triamcinolone -patient feels that he needs to have antibiotic -will give doxy told patient to hold if no improvement than to take.

## 2015-05-13 NOTE — Patient Instructions (Signed)

## 2015-05-22 ENCOUNTER — Encounter: Payer: Self-pay | Admitting: Cardiology

## 2015-05-22 DIAGNOSIS — I350 Nonrheumatic aortic (valve) stenosis: Secondary | ICD-10-CM

## 2015-05-22 NOTE — Progress Notes (Unsigned)
Patient ID: Anthony Holder, male   DOB: 24-Jan-1941, 74 y.o.   MRN: [id number]   Anthony, Holder    Date of visit:  05/22/2015 DOB:  01/17/1941    Age:  74 yrs. Medical record number:  [id number]     Account number:  [id number] Primary Care Provider: [name] ____________________________ CURRENT DIAGNOSES  1. Encounter for preprocedural cardiovascular examination  2. CAD Native without angina  3. Aortic valve stenosis  4. Obesity  5. Hyperlipidemia  6. Hypertensive heart disease without heart failure  7. Occlusion And Stenosis Of Bilateral Carotid Arteries  8. Type 2 Diabetes Mellitus With Diabetic Polyneuropathy  9. Type 2 diabetes mellitus with other circulatory complications  10. Acquired Absence Of Leg Below Knee ____________________________ ALLERGIES  Hmg-Coa Reductase Inhibitors, Muscle aches ____________________________ MEDICATIONS  1. atenolol 50 mg tablet, 1 p.o. daily  2. baclofen 10 mg tablet, 1/2 to 1 tablet TID prn muscle spasms  3. Vitamin D3 5,000 unit tablet, 1 p.o. daily  4. WelChol 625 mg tablet, 3 po BID  5. cyclobenzaprine 10 mg tablet, 1/2 to 1 po TID prn  6. ferrous sulfate 324 mg (65 mg iron) tablet,delayed release, BID  7. furosemide 40 mg tablet, BID  8. gabapentin 300 mg capsule, TID  9. hydrocodone 5 mg-acetaminophen 325 mg tablet, prn  10. nitroglycerin 0.4 mg sublingual tablet, PRN  11. potassium chloride ER 20 mEq tablet,extended release, BID  12. tramadol 50 mg tablet, 4 x daily prn  13. metformin ER 500 mg tablet,extended release 24 hr, Take as directed  14. Aspirin Childrens 81 mg chewable tablet, 1 p.o. daily ____________________________ CHIEF COMPLAINTS  Followup of CAD Native without angina  Followup of Encounter for preprocedural cardiovascular examination ____________________________ HISTORY OF PRESENT ILLNESS Patient returns for cardiac followup. The patient had an echocardiogram showing moderate to severe aortic stenosis. His  ejection fraction appeared to be around 40-45% on secondary review of the images. The patient denies any shortness of breath. He has no PND, orthopnea or claudication. He does have a previous amputation and he does state that he is asymptomatic. He needs to have cervical disc surgery as well as lumbar disc surgery at a later date. ____________________________ PAST HISTORY  Past Medical Illnesses:  hypertension, DM-insulin dependent, hyperlipidemia, CVA, BPH, osteomyelitis 2013 leading to amputation, lumbar disc disease, peripheral neuropathy;  Cardiovascular Illnesses:  CAD, Carotid artery disease, aortic stenosis;  Surgical Procedures:  CABG, lumbar laminectomy 2006  x 2, BKA-left, carotid endarterectomy-rt February 2010, tonsillectomy/adenoids, cholecystectomy (lap);  NYHA Classification:  II;  Canadian Angina Classification:  Class 0: Asymptomatic;  Cardiology Procedures-Invasive:  cardiac cath (left) 2010, CABG with LIMA to LAD, SVG to intermediate, SVG to OM2 -3, SVG to RCA 02/11/09 Dr. Alla German (CEA on right at same time);  Cardiology Procedures-Noninvasive:  echocardiogram;  Cardiac Cath Results:  50% stenosis distal Left main, 90% stenosis proximal LAD, 80% stenosis proximal OM 1, 70% stenosis mid LPD, 40% stenosis distal RCA, normal LV;  LVEF not documented,   ____________________________ CARDIO-PULMONARY TEST DATES Cardiac Cath Date:  01/31/2009;  CABG: 02/11/2009;  Echocardiography Date: 05/12/2015;  Chest Xray Date: 04/17/2015;   ____________________________ FAMILY HISTORY Brother -- Brother alive with problem Father -- Father dead Mother -- Renal failure syndrome, Mother dead, Leukemia Sister -- Diabetes mellitus, Sister alive and well, Diabetes mellitus Sister -- Sister alive with problem, Diabetes mellitus ____________________________ SOCIAL HISTORY Alcohol Use:  does not use alcohol;  Smoking:  really none;  Diet:  diabetic diet;  Lifestyle:  divorced, remarried and 5 children;   Exercise:  exercise is limited due to physical disability;  Occupation:  retired Administrator;  Residence:  lives with wife;   ____________________________ REVIEW OF SYSTEMS General:  no change in weight Eyes: wears eye glasses/contact lenses, no diabetic retinopathy Respiratory: denies dyspnea, cough, wheezing or hemoptysis. Cardiovascular:  please review HPI Abdominal: denies dyspepsia, GI bleeding, constipation, or diarrhea Genitourinary-Male: no dysuria, urgency, frequency, or nocturia  Musculoskeletal:  severe back pain , weakness Neurological:  unsteady gait, left arm numbness  ____________________________ PHYSICAL EXAMINATION VITAL SIGNS  Blood Pressure:  110/70 Sitting, Right arm, regular cuff  , 120/74 Standing, Right arm and regular cuff   Pulse:  82/min. Weight:  207.00 lbs. Height:  68"BMI: 31  Constitutional:  pleasant white male in no acute distress walks with cane Skin:  warm and dry to touch, no apparent skin lesions, or masses noted. Head:  normocephalic, balding male hair pattern Neck:  healed right carotid endarterectomy scar, no JVD, no bruits, no masses Chest:  normal symmetry, clear to auscultation., healed median sternotomy scar Cardiac:  regular rhythm, normal S1 and S2, no S3 or S4, grade 3/6 harsh systolic murmur at aortic area radiating to neck as well as the back Abdomen:  abdomen soft,non-tender, no masses, no hepatospenomegaly, or aneurysm noted Peripheral Pulses:  femoral pulses 1-2+ with faint bruits,  pedal pulses are difficult to feel on the right Extremities & Back:  left BKA, 1+ edema right leg,  unable to raise his left arm above the level of the shoulder Neurological:   somewhat unsteady wide-based gait, mild weakness of the left arm ____________________________ MOST RECENT LIPID PANEL 04/09/15  CHOL TOTL 187 mg/dl, LDL 94 NM, HDL 25 mg/dl, TRIGLYCER 338 mg/dl, ALT 11 u/l, ALK PHOS 51 u/l, CHOL/HDL 7.5 (Calc) and AST 15  u/l ____________________________ IMPRESSIONS/PLAN  1. From a cardiovascular viewpoint at this point may proceed with the planned cervical disc surgery. His anesthetic risk would be in the intermediate range. He does have moderate to severe aortic stenosis which is asymptomatic and does not need further investigation. He did have successful cardiovascular revascularization a number of years ago. He has significant comorbidities and peripheral vascular disease but no further cardiovascular workup is necessary at this time. That surgery should be performed in a hospital setting and he should be monitored on telemetry following surgery. 2. Coronary artery disease with previous bypass grafting 3. Diabetes mellitus with diabetic polyneuropathy 4. Hypertensive heart disease 5. Moderate to severe aortic stenosis which is asymptomatic  Recommendations:  Please let me know if you have like for me to see him with you at the time of surgery. We did discuss aortic stenosis and I did mention the possibility of possible worsening over the years. He will need to have a repeat echo on an annual basis at this point unless he develops recurrent symptoms. ____________________________ TODAYS ORDERS  1. Return Visit: 6 months                       ____________________________ Cardiology Physician:  Kerry Hough MD [facility]

## 2015-05-28 ENCOUNTER — Other Ambulatory Visit: Payer: Self-pay | Admitting: *Deleted

## 2015-05-28 MED ORDER — METFORMIN HCL ER 500 MG PO TB24: 500.0000 mg | ORAL_TABLET | Freq: Four times a day (QID) | ORAL | Status: DC | PRN

## 2015-05-29 ENCOUNTER — Other Ambulatory Visit: Payer: Self-pay | Admitting: *Deleted

## 2015-05-29 MED ORDER — ATENOLOL 50 MG PO TABS: 50.0000 mg | ORAL_TABLET | Freq: Every day | ORAL | Status: DC

## 2015-05-29 MED ORDER — METFORMIN HCL ER 500 MG PO TB24: 500.0000 mg | ORAL_TABLET | Freq: Four times a day (QID) | ORAL | Status: DC | PRN

## 2015-05-29 MED ORDER — GABAPENTIN 300 MG PO CAPS: 300.0000 mg | ORAL_CAPSULE | Freq: Three times a day (TID) | ORAL | Status: DC

## 2015-06-09 ENCOUNTER — Other Ambulatory Visit: Payer: Self-pay | Admitting: Orthopedic Surgery

## 2015-06-13 ENCOUNTER — Encounter (HOSPITAL_COMMUNITY)
Admission: RE | Admit: 2015-06-13 | Discharge: 2015-06-13 | Disposition: A | Discharge status: Home / Self Care | Payer: PPO | Source: Ambulatory Visit | Attending: Orthopedic Surgery | Admitting: Orthopedic Surgery

## 2015-06-13 DIAGNOSIS — E1142 Type 2 diabetes mellitus with diabetic polyneuropathy: Secondary | ICD-10-CM | POA: Insufficient documentation

## 2015-06-13 DIAGNOSIS — Z01812 Encounter for preprocedural laboratory examination: Secondary | ICD-10-CM | POA: Diagnosis present

## 2015-06-13 DIAGNOSIS — I739 Peripheral vascular disease, unspecified: Secondary | ICD-10-CM | POA: Insufficient documentation

## 2015-06-13 DIAGNOSIS — I350 Nonrheumatic aortic (valve) stenosis: Secondary | ICD-10-CM | POA: Diagnosis not present

## 2015-06-13 DIAGNOSIS — I119 Hypertensive heart disease without heart failure: Secondary | ICD-10-CM | POA: Insufficient documentation

## 2015-06-13 DIAGNOSIS — I2510 Atherosclerotic heart disease of native coronary artery without angina pectoris: Secondary | ICD-10-CM | POA: Insufficient documentation

## 2015-06-13 LAB — CBC WITH DIFFERENTIAL/PLATELET
BASOS ABS: 0.1 10*3/uL (ref 0.0–0.1)
Basophils Relative: 1 % (ref 0–1)
EOS ABS: 0.4 10*3/uL (ref 0.0–0.7)
EOS PCT: 3 % (ref 0–5)
HEMATOCRIT: 42 % (ref 39.0–52.0)
Hemoglobin: 14 g/dL (ref 13.0–17.0)
LYMPHS PCT: 20 % (ref 12–46)
Lymphs Abs: 2.2 10*3/uL (ref 0.7–4.0)
MCH: 28.2 pg (ref 26.0–34.0)
MCHC: 33.3 g/dL (ref 30.0–36.0)
MCV: 84.7 fL (ref 78.0–100.0)
Monocytes Absolute: 0.9 10*3/uL (ref 0.1–1.0)
Monocytes Relative: 9 % (ref 3–12)
NEUTROS ABS: 7.1 10*3/uL (ref 1.7–7.7)
Neutrophils Relative %: 67 % (ref 43–77)
Platelets: 232 10*3/uL (ref 150–400)
RBC: 4.96 MIL/uL (ref 4.22–5.81)
RDW: 14.3 % (ref 11.5–15.5)
WBC: 10.6 10*3/uL — AB (ref 4.0–10.5)

## 2015-06-13 LAB — COMPREHENSIVE METABOLIC PANEL
ALT: 13 U/L — ABNORMAL LOW (ref 17–63)
AST: 17 U/L (ref 15–41)
Albumin: 4.1 g/dL (ref 3.5–5.0)
Alkaline Phosphatase: 48 U/L (ref 38–126)
Anion gap: 10 (ref 5–15)
BUN: 18 mg/dL (ref 6–20)
CALCIUM: 9.5 mg/dL (ref 8.9–10.3)
CO2: 23 mmol/L (ref 22–32)
CREATININE: 1.2 mg/dL (ref 0.61–1.24)
Chloride: 103 mmol/L (ref 101–111)
GFR, EST NON AFRICAN AMERICAN: 58 mL/min — AB (ref >60)
GLUCOSE: 207 mg/dL — AB (ref 65–99)
Potassium: 4.1 mmol/L (ref 3.5–5.1)
Sodium: 136 mmol/L (ref 135–145)
Total Bilirubin: 0.7 mg/dL (ref 0.3–1.2)
Total Protein: 7.5 g/dL (ref 6.5–8.1)

## 2015-06-13 LAB — URINE MICROSCOPIC-ADD ON

## 2015-06-13 LAB — URINALYSIS, ROUTINE W REFLEX MICROSCOPIC
Bilirubin Urine: NEGATIVE
Glucose, UA: 100 mg/dL — AB
Ketones, ur: NEGATIVE mg/dL
Leukocytes, UA: NEGATIVE
NITRITE: NEGATIVE
PROTEIN: 100 mg/dL — AB
Specific Gravity, Urine: 1.011 (ref 1.005–1.030)
UROBILINOGEN UA: 1 mg/dL (ref 0.0–1.0)
pH: 6 (ref 5.0–8.0)

## 2015-06-13 LAB — SURGICAL PCR SCREEN
MRSA, PCR: NEGATIVE
Staphylococcus aureus: NEGATIVE

## 2015-06-13 LAB — PROTIME-INR
INR: 1.15 (ref 0.00–1.49)
Prothrombin Time: 14.9 seconds (ref 11.6–15.2)

## 2015-06-13 LAB — APTT: aPTT: 37 seconds (ref 24–37)

## 2015-06-13 NOTE — Pre-Procedure Instructions (Signed)
    [place] Boyett  06/13/2015       Your procedure is scheduled on 06-19-2015   Report to [facility] Admitting at 10:00 A.M.   Call this number if you have problems the morning of surgery:  [phone]   Remember:  Do not eat food or drink liquids after midnight, Wednesday, June 29.   Take these medicines the morning of surgery with A SIP OF WATER atenolol(Tenormin).muscle relaxer if needed,gabapentin(neurontin)pain medication if needed,potassium chloride                Stop taking Vitamins, Garlic and Fish Oil    Do not wear jewelry,  Do not wear lotions, powders, or perfumes.    Do not shave 48 hours prior to surgery.  Men may shave face and neck.   Do not bring valuables to the hospital.  [facility] is not responsible for any belongings or valuables.  Contacts, dentures or bridgework may not be worn into surgery.  Leave your suitcase in the car.  After surgery it may be brought to your room.  For patients admitted to the hospital, discharge time will be determined by your treatment team.     Special instructions:  See attached sheet for instructions on CHG shower/bath  Please read over the following fact sheets that you were given. Pain Booklet and Surgical Site Infection Prevention

## 2015-06-13 NOTE — Pre-Procedure Instructions (Signed)
    [facility]  06/13/2015      [facility] - [place], [place] [address] AT [address] [place] [address] Phone: [phone] Fax: [phone]  [facility] [address], [place] [address] [place] [address] Phone: [phone] Fax: [phone] [place], [place] - [address] [place] [address] Phone: [phone] Fax: [phone]  [facility] [address], [place] [address] [place] [address] Phone: [phone] Fax: [phone]  [facility] [place], [place] [address] [place] [address] Phone: [phone] Fax: [phone]  [facility] [place], [place] [address] Phone: [phone] Fax: [phone]    Your procedure is scheduled on 06-19-2015   Report to [facility] Admitting at 10:00 A.M.   Call this number if you have problems the morning of surgery:  [phone]   Remember:  Do not eat food or drink liquids after midnight.   Take these medicines the morning of surgery with A SIP OF WATER atenolol(Tenormin).muscle relaxer if needed,gabapentin(neurontin)pain medication if needed,potassium chloride    Do not wear jewelry,  Do not wear lotions, powders, or perfumes.    Do not shave 48 hours prior to surgery.  Men may shave face and neck.   Do not bring valuables to the hospital.  [facility] is not responsible for any belongings or valuables.  Contacts, dentures or bridgework may not be worn into surgery.  Leave your suitcase in the car.  After surgery it may be brought to your room.  For patients admitted to the hospital, discharge time will be determined by your treatment team.     Special instructions:  See attached sheet  for instructions on CHG shower/bath  Please read over the following fact sheets that you were given. Pain Booklet and Surgical Site Infection Prevention

## 2015-06-14 LAB — HEMOGLOBIN A1C
HEMOGLOBIN A1C: 8.7 % — AB (ref 4.8–5.6)
Mean Plasma Glucose: 203 mg/dL

## 2015-06-16 NOTE — Progress Notes (Signed)
Anesthesia Chart Review: Patient is a 74 year old male scheduled for C3-4, C4-5 ACDF on 06/19/15 by Dr. Lynann Bologna. Procedure was originally scheduled for 04/23/15, but was postponed to allow for cardiology clearance. Patient had medical clearance from Dr. Melford Aase at that time. See my note dated 04/17/15 for details regarding history and recent diagnostic testing up to that point.   Patient has since been referred and seen by cardiologist Dr. Wynonia Lawman for a preoperative evaluation. Apparently, patient also needs lumbar surgery at at later date. Per Dr. Thurman Coyer IMPRESSIONS/PLAN: 1. From a cardiovascular viewpoint at this point may proceed with the planned cervical disc surgery. His anesthetic risk would be in the intermediate range. He does have moderate to severe aortic stenosis which is asymptomatic and does not need further investigation. He did have successful cardiovascular revascularization a number of years ago. He has significant comorbidities and peripheral vascular disease but no further cardiovascular workup is necessary at this time. That surgery should be performed in a hospital setting and he should be monitored on telemetry following surgery. 2. Coronary artery disease with previous bypass grafting 3. Diabetes mellitus with diabetic polyneuropathy 4. Hypertensive heart disease 5. Moderate to severe aortic stenosis which is asymptomatic Recommendations: Please let me know if you have like for me to see him with you at the time of surgery. We did discuss aortic stenosis and I did mention the possibility of possible worsening over the years. He will need to have a repeat echo on an annual basis at this point unless he develops recurrent symptoms.  Last cath was 01/31/09 prior to his CABG.  04/30/15 EKG (Dr. Wynonia Lawman): SR, horizontal axis, moderate high-lateral repolarization disturbance, consider ischemia or LV overload, negative T in high lateral leads.   05/12/15 Echo (Dr. Wynonia Lawman): Moderate concentric  LVH with moderate global hypokinesis. Estimated LVEF 35%. Mild to moderate LAE. Calcified and thickened AV with moderate to severe AS (AV Pk Vel 3.22 m/s, AV Pk Grad 41.3 mmHg, AV Mean Grad 26.0 mmHg, AV VTI 81.0 cm, AVA Vmax 0.58 cm2). Trace mitral and pulmonic regurgitation. Mild TR.   Preoperative labs noted. A1C 8.7. He will get a fasting CBG on the day of surgery.   Patient was previously cleared medically. He now has cardiac clearance as well.  Repeat echo recommended in one year since. Patient was asymptomatic of his cardiac and valvular disease earlier this month.  If no acute changes then I anticipate that he can proceed as planned.   [facility] Short Stay Center/Anesthesiology Phone [phone] 06/16/2015 12:47 PM

## 2015-06-18 MED ORDER — CEFAZOLIN SODIUM-DEXTROSE 2-3 GM-% IV SOLR
2.0000 g | INTRAVENOUS | Status: AC
  Administered 2015-06-19: 2 g via INTRAVENOUS
  Filled 2015-06-18: qty 50
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
## 2015-06-19 ENCOUNTER — Inpatient Hospital Stay (HOSPITAL_COMMUNITY)
Admission: RE | Admit: 2015-06-19 | Discharge: 2015-06-20 | DRG: 030 | Disposition: A | Discharge status: Home / Self Care | Payer: PPO | Source: Ambulatory Visit | Attending: Orthopedic Surgery | Admitting: Orthopedic Surgery

## 2015-06-19 ENCOUNTER — Inpatient Hospital Stay (HOSPITAL_COMMUNITY): Payer: PPO | Admitting: Vascular Surgery

## 2015-06-19 ENCOUNTER — Encounter (HOSPITAL_COMMUNITY)
Admission: RE | Disposition: A | Discharge status: Home / Self Care | Payer: PPO | Source: Ambulatory Visit | Attending: Orthopedic Surgery

## 2015-06-19 ENCOUNTER — Inpatient Hospital Stay (HOSPITAL_COMMUNITY): Payer: PPO | Admitting: Anesthesiology

## 2015-06-19 ENCOUNTER — Inpatient Hospital Stay (HOSPITAL_COMMUNITY): Payer: PPO

## 2015-06-19 ENCOUNTER — Encounter (HOSPITAL_COMMUNITY): Payer: Self-pay | Admitting: *Deleted

## 2015-06-19 DIAGNOSIS — E669 Obesity, unspecified: Secondary | ICD-10-CM | POA: Diagnosis present

## 2015-06-19 DIAGNOSIS — Z951 Presence of aortocoronary bypass graft: Secondary | ICD-10-CM

## 2015-06-19 DIAGNOSIS — Z85828 Personal history of other malignant neoplasm of skin: Secondary | ICD-10-CM | POA: Diagnosis not present

## 2015-06-19 DIAGNOSIS — E1140 Type 2 diabetes mellitus with diabetic neuropathy, unspecified: Secondary | ICD-10-CM | POA: Diagnosis present

## 2015-06-19 DIAGNOSIS — I2510 Atherosclerotic heart disease of native coronary artery without angina pectoris: Secondary | ICD-10-CM | POA: Diagnosis present

## 2015-06-19 DIAGNOSIS — E780 Pure hypercholesterolemia: Secondary | ICD-10-CM | POA: Diagnosis present

## 2015-06-19 DIAGNOSIS — G9520 Unspecified cord compression: Secondary | ICD-10-CM | POA: Diagnosis present

## 2015-06-19 DIAGNOSIS — Z87891 Personal history of nicotine dependence: Secondary | ICD-10-CM

## 2015-06-19 DIAGNOSIS — E1151 Type 2 diabetes mellitus with diabetic peripheral angiopathy without gangrene: Secondary | ICD-10-CM | POA: Diagnosis present

## 2015-06-19 DIAGNOSIS — I1310 Hypertensive heart and chronic kidney disease without heart failure, with stage 1 through stage 4 chronic kidney disease, or unspecified chronic kidney disease: Secondary | ICD-10-CM | POA: Diagnosis present

## 2015-06-19 DIAGNOSIS — E1122 Type 2 diabetes mellitus with diabetic chronic kidney disease: Secondary | ICD-10-CM | POA: Diagnosis present

## 2015-06-19 DIAGNOSIS — I350 Nonrheumatic aortic (valve) stenosis: Secondary | ICD-10-CM | POA: Diagnosis present

## 2015-06-19 DIAGNOSIS — J449 Chronic obstructive pulmonary disease, unspecified: Secondary | ICD-10-CM | POA: Diagnosis present

## 2015-06-19 DIAGNOSIS — Z8673 Personal history of transient ischemic attack (TIA), and cerebral infarction without residual deficits: Secondary | ICD-10-CM

## 2015-06-19 DIAGNOSIS — R2689 Other abnormalities of gait and mobility: Secondary | ICD-10-CM

## 2015-06-19 DIAGNOSIS — Z6828 Body mass index (BMI) 28.0-28.9, adult: Secondary | ICD-10-CM

## 2015-06-19 DIAGNOSIS — N182 Chronic kidney disease, stage 2 (mild): Secondary | ICD-10-CM | POA: Diagnosis present

## 2015-06-19 DIAGNOSIS — N400 Enlarged prostate without lower urinary tract symptoms: Secondary | ICD-10-CM | POA: Diagnosis present

## 2015-06-19 DIAGNOSIS — Z89512 Acquired absence of left leg below knee: Secondary | ICD-10-CM | POA: Diagnosis not present

## 2015-06-19 DIAGNOSIS — G9589 Other specified diseases of spinal cord: Secondary | ICD-10-CM | POA: Diagnosis present

## 2015-06-19 DIAGNOSIS — G549 Nerve root and plexus disorder, unspecified: Secondary | ICD-10-CM | POA: Diagnosis present

## 2015-06-19 HISTORY — PX: ANTERIOR CERVICAL DECOMP/DISCECTOMY FUSION: SHX1161

## 2015-06-19 LAB — GLUCOSE, CAPILLARY
GLUCOSE-CAPILLARY: 114 mg/dL — AB (ref 65–99)
GLUCOSE-CAPILLARY: 150 mg/dL — AB (ref 65–99)
Glucose-Capillary: 143 mg/dL — ABNORMAL HIGH (ref 65–99)
Glucose-Capillary: 131 mg/dL — ABNORMAL HIGH (ref 65–99)
Glucose-Capillary: 226 mg/dL — ABNORMAL HIGH (ref 65–99)

## 2015-06-19 LAB — MRSA PCR SCREENING: MRSA by PCR: NEGATIVE

## 2015-06-19 SURGERY — ANTERIOR CERVICAL DECOMPRESSION/DISCECTOMY FUSION 2 LEVELS
Anesthesia: General | Site: Spine Cervical

## 2015-06-19 MED ORDER — ZOLPIDEM TARTRATE 5 MG PO TABS: 5.0000 mg | ORAL_TABLET | Freq: Every evening | ORAL | Status: DC | PRN

## 2015-06-19 MED ORDER — MEPERIDINE HCL 25 MG/ML IJ SOLN: 6.2500 mg | INTRAMUSCULAR | Status: DC | PRN

## 2015-06-19 MED ORDER — POTASSIUM CHLORIDE CRYS ER 20 MEQ PO TBCR
20.0000 meq | EXTENDED_RELEASE_TABLET | Freq: Every day | ORAL | Status: DC
  Filled 2015-06-19: qty 1

## 2015-06-19 MED ORDER — DOCUSATE SODIUM 100 MG PO CAPS: 100.0000 mg | ORAL_CAPSULE | Freq: Two times a day (BID) | ORAL | Status: DC

## 2015-06-19 MED ORDER — ACETAMINOPHEN 325 MG PO TABS: 650.0000 mg | ORAL_TABLET | ORAL | Status: DC | PRN

## 2015-06-19 MED ORDER — CEFAZOLIN SODIUM 1-5 GM-% IV SOLN
1.0000 g | Freq: Three times a day (TID) | INTRAVENOUS | Status: AC
Start: 2015-06-19 — End: 2015-06-20
  Administered 2015-06-19 – 2015-06-20 (×2): 1 g via INTRAVENOUS
  Filled 2015-06-19 (×2): qty 50

## 2015-06-19 MED ORDER — THROMBIN 20000 UNITS EX KIT
PACK | CUTANEOUS | Status: DC | PRN
  Administered 2015-06-19: 20000 [IU] via TOPICAL

## 2015-06-19 MED ORDER — 0.9 % SODIUM CHLORIDE (POUR BTL) OPTIME
TOPICAL | Status: DC | PRN
  Administered 2015-06-19: 1000 mL

## 2015-06-19 MED ORDER — SODIUM CHLORIDE 0.9 % IV SOLN
INTRAVENOUS | Status: DC
  Administered 2015-06-19: 22:00:00 via INTRAVENOUS

## 2015-06-19 MED ORDER — BUPIVACAINE-EPINEPHRINE 0.25% -1:200000 IJ SOLN
INTRAMUSCULAR | Status: DC | PRN
  Administered 2015-06-19: 10 mL

## 2015-06-19 MED ORDER — FENTANYL CITRATE (PF) 250 MCG/5ML IJ SOLN
INTRAMUSCULAR | Status: AC
  Filled 2015-06-19: qty 5

## 2015-06-19 MED ORDER — ONDANSETRON HCL 4 MG/2ML IJ SOLN
INTRAMUSCULAR | Status: AC
  Filled 2015-06-19: qty 4

## 2015-06-19 MED ORDER — ATENOLOL 50 MG PO TABS
50.0000 mg | ORAL_TABLET | Freq: Every day | ORAL | Status: DC
  Filled 2015-06-19: qty 1

## 2015-06-19 MED ORDER — MENTHOL 3 MG MT LOZG
1.0000 | LOZENGE | OROMUCOSAL | Status: DC | PRN
  Administered 2015-06-20: 3 mg via ORAL
  Filled 2015-06-19 (×2): qty 9

## 2015-06-19 MED ORDER — NEOSTIGMINE METHYLSULFATE 10 MG/10ML IV SOLN
INTRAVENOUS | Status: DC | PRN
  Administered 2015-06-19: 2.5 mg via INTRAVENOUS

## 2015-06-19 MED ORDER — INSULIN NPH (HUMAN) (ISOPHANE) 100 UNIT/ML ~~LOC~~ SUSP
30.0000 [IU] | Freq: Every day | SUBCUTANEOUS | Status: DC
  Filled 2015-06-19: qty 10

## 2015-06-19 MED ORDER — SODIUM CHLORIDE 0.9 % IV SOLN
250.0000 mL | INTRAVENOUS | Status: DC
Start: 2015-06-19 — End: 2015-06-20

## 2015-06-19 MED ORDER — ESMOLOL HCL 10 MG/ML IV SOLN
INTRAVENOUS | Status: AC
  Filled 2015-06-19: qty 10

## 2015-06-19 MED ORDER — HYDRALAZINE HCL 20 MG/ML IJ SOLN
INTRAMUSCULAR | Status: AC
  Filled 2015-06-19: qty 1

## 2015-06-19 MED ORDER — SUCCINYLCHOLINE CHLORIDE 20 MG/ML IJ SOLN
INTRAMUSCULAR | Status: AC
  Filled 2015-06-19: qty 1

## 2015-06-19 MED ORDER — FENTANYL CITRATE (PF) 250 MCG/5ML IJ SOLN
INTRAMUSCULAR | Status: DC | PRN
  Administered 2015-06-19 (×4): 50 ug via INTRAVENOUS
  Administered 2015-06-19: 250 ug via INTRAVENOUS
  Administered 2015-06-19: 50 ug via INTRAVENOUS

## 2015-06-19 MED ORDER — DEXTROSE 5 % IV SOLN
10.0000 mg | INTRAVENOUS | Status: DC | PRN
  Administered 2015-06-19 (×2): 25 ug/min via INTRAVENOUS

## 2015-06-19 MED ORDER — GABAPENTIN 300 MG PO CAPS
300.0000 mg | ORAL_CAPSULE | Freq: Three times a day (TID) | ORAL | Status: DC
  Filled 2015-06-19 (×4): qty 1

## 2015-06-19 MED ORDER — DIAZEPAM 5 MG PO TABS: 5.0000 mg | ORAL_TABLET | Freq: Four times a day (QID) | ORAL | Status: DC | PRN

## 2015-06-19 MED ORDER — BISACODYL 5 MG PO TBEC: 5.0000 mg | DELAYED_RELEASE_TABLET | Freq: Every day | ORAL | Status: DC | PRN

## 2015-06-19 MED ORDER — METOCLOPRAMIDE HCL 5 MG/ML IJ SOLN
INTRAMUSCULAR | Status: DC | PRN
  Administered 2015-06-19: 10 mg via INTRAVENOUS

## 2015-06-19 MED ORDER — THROMBIN 20000 UNITS EX SOLR
CUTANEOUS | Status: AC
  Filled 2015-06-19: qty 20000

## 2015-06-19 MED ORDER — LIDOCAINE HCL 4 % MT SOLN
OROMUCOSAL | Status: DC | PRN
  Administered 2015-06-19: 4 mL via TOPICAL

## 2015-06-19 MED ORDER — FLEET ENEMA 7-19 GM/118ML RE ENEM: 1.0000 | ENEMA | Freq: Once | RECTAL | Status: AC | PRN

## 2015-06-19 MED ORDER — COLESEVELAM HCL 625 MG PO TABS
1875.0000 mg | ORAL_TABLET | Freq: Every day | ORAL | Status: DC
  Filled 2015-06-19 (×2): qty 3

## 2015-06-19 MED ORDER — SUCCINYLCHOLINE CHLORIDE 20 MG/ML IJ SOLN
INTRAMUSCULAR | Status: DC | PRN
  Administered 2015-06-19: 100 mg via INTRAVENOUS

## 2015-06-19 MED ORDER — GLYCOPYRROLATE 0.2 MG/ML IJ SOLN
INTRAMUSCULAR | Status: DC | PRN
  Administered 2015-06-19: 0.3 mg via INTRAVENOUS

## 2015-06-19 MED ORDER — HYDRALAZINE HCL 20 MG/ML IJ SOLN
INTRAMUSCULAR | Status: DC | PRN
  Administered 2015-06-19: 5 mg via INTRAVENOUS

## 2015-06-19 MED ORDER — MORPHINE SULFATE 2 MG/ML IJ SOLN
1.0000 mg | INTRAMUSCULAR | Status: DC | PRN
  Administered 2015-06-19: 2 mg via INTRAVENOUS
  Administered 2015-06-20: 4 mg via INTRAVENOUS
  Filled 2015-06-19: qty 1
  Filled 2015-06-19: qty 2

## 2015-06-19 MED ORDER — PROPOFOL 10 MG/ML IV BOLUS
INTRAVENOUS | Status: AC
  Filled 2015-06-19: qty 20

## 2015-06-19 MED ORDER — PROMETHAZINE HCL 25 MG/ML IJ SOLN: 6.2500 mg | INTRAMUSCULAR | Status: DC | PRN

## 2015-06-19 MED ORDER — BUPIVACAINE-EPINEPHRINE (PF) 0.25% -1:200000 IJ SOLN
INTRAMUSCULAR | Status: AC
  Filled 2015-06-19: qty 30

## 2015-06-19 MED ORDER — METFORMIN HCL ER 500 MG PO TB24
500.0000 mg | ORAL_TABLET | Freq: Every day | ORAL | Status: DC
  Filled 2015-06-19 (×2): qty 1

## 2015-06-19 MED ORDER — PHENOL 1.4 % MT LIQD
1.0000 | OROMUCOSAL | Status: DC | PRN
  Filled 2015-06-19: qty 177

## 2015-06-19 MED ORDER — VITAMIN D3 25 MCG (1000 UNIT) PO TABS
5000.0000 [IU] | ORAL_TABLET | Freq: Every day | ORAL | Status: DC
  Filled 2015-06-19: qty 5

## 2015-06-19 MED ORDER — LACTATED RINGERS IV SOLN
INTRAVENOUS | Status: DC | PRN
  Administered 2015-06-19 (×2): via INTRAVENOUS

## 2015-06-19 MED ORDER — HYDROMORPHONE HCL 1 MG/ML IJ SOLN
0.2500 mg | INTRAMUSCULAR | Status: DC | PRN
  Administered 2015-06-19 (×2): 0.5 mg via INTRAVENOUS

## 2015-06-19 MED ORDER — SODIUM CHLORIDE 0.9 % IJ SOLN: 3.0000 mL | Freq: Two times a day (BID) | INTRAMUSCULAR | Status: DC

## 2015-06-19 MED ORDER — SODIUM CHLORIDE 0.9 % IJ SOLN: 3.0000 mL | INTRAMUSCULAR | Status: DC | PRN

## 2015-06-19 MED ORDER — GELATIN ABSORBABLE 12-7 MM EX MISC
CUTANEOUS | Status: DC | PRN
  Administered 2015-06-19: 1

## 2015-06-19 MED ORDER — ARTIFICIAL TEARS OP OINT
TOPICAL_OINTMENT | OPHTHALMIC | Status: DC | PRN
  Administered 2015-06-19: 1 via OPHTHALMIC

## 2015-06-19 MED ORDER — ACETAMINOPHEN 650 MG RE SUPP: 650.0000 mg | RECTAL | Status: DC | PRN

## 2015-06-19 MED ORDER — ALUM & MAG HYDROXIDE-SIMETH 200-200-20 MG/5ML PO SUSP: 30.0000 mL | Freq: Four times a day (QID) | ORAL | Status: DC | PRN

## 2015-06-19 MED ORDER — DOXYCYCLINE HYCLATE 100 MG PO CAPS: 100.0000 mg | ORAL_CAPSULE | Freq: Two times a day (BID) | ORAL | Status: DC

## 2015-06-19 MED ORDER — FERROUS SULFATE 325 (65 FE) MG PO TABS
ORAL_TABLET | Freq: Every day | ORAL | Status: DC
  Filled 2015-06-19: qty 1

## 2015-06-19 MED ORDER — ARTIFICIAL TEARS OP OINT
TOPICAL_OINTMENT | OPHTHALMIC | Status: AC
  Filled 2015-06-19: qty 3.5

## 2015-06-19 MED ORDER — INSULIN NPH (HUMAN) (ISOPHANE) 100 UNIT/ML ~~LOC~~ SUSP: 15.0000 [IU] | Freq: Two times a day (BID) | SUBCUTANEOUS | Status: DC

## 2015-06-19 MED ORDER — PROPOFOL 10 MG/ML IV BOLUS
INTRAVENOUS | Status: DC | PRN
  Administered 2015-06-19: 100 mg via INTRAVENOUS

## 2015-06-19 MED ORDER — ONDANSETRON HCL 4 MG/2ML IJ SOLN
INTRAMUSCULAR | Status: DC | PRN
  Administered 2015-06-19 (×2): 4 mg via INTRAVENOUS

## 2015-06-19 MED ORDER — LABETALOL HCL 5 MG/ML IV SOLN
INTRAVENOUS | Status: AC
  Filled 2015-06-19: qty 4

## 2015-06-19 MED ORDER — PHENYLEPHRINE HCL 10 MG/ML IJ SOLN
INTRAMUSCULAR | Status: DC | PRN
  Administered 2015-06-19: 80 ug via INTRAVENOUS

## 2015-06-19 MED ORDER — LACTATED RINGERS IV SOLN: INTRAVENOUS | Status: DC

## 2015-06-19 MED ORDER — NEOSTIGMINE METHYLSULFATE 10 MG/10ML IV SOLN
INTRAVENOUS | Status: AC
  Filled 2015-06-19: qty 1

## 2015-06-19 MED ORDER — OXYCODONE-ACETAMINOPHEN 5-325 MG PO TABS: 1.0000 | ORAL_TABLET | ORAL | Status: DC | PRN

## 2015-06-19 MED ORDER — HYDROMORPHONE HCL 1 MG/ML IJ SOLN
INTRAMUSCULAR | Status: AC
  Administered 2015-06-19: 0.5 mg via INTRAVENOUS
  Filled 2015-06-19: qty 1

## 2015-06-19 MED ORDER — LIDOCAINE HCL (CARDIAC) 20 MG/ML IV SOLN
INTRAVENOUS | Status: DC | PRN
  Administered 2015-06-19: 100 mg via INTRAVENOUS

## 2015-06-19 MED ORDER — SENNOSIDES-DOCUSATE SODIUM 8.6-50 MG PO TABS
1.0000 | ORAL_TABLET | Freq: Every evening | ORAL | Status: DC | PRN
  Filled 2015-06-19: qty 1

## 2015-06-19 MED ORDER — FUROSEMIDE 40 MG PO TABS
40.0000 mg | ORAL_TABLET | Freq: Every day | ORAL | Status: DC
  Filled 2015-06-19: qty 1

## 2015-06-19 MED ORDER — NITROGLYCERIN 0.4 MG SL SUBL: 0.4000 mg | SUBLINGUAL_TABLET | SUBLINGUAL | Status: DC | PRN

## 2015-06-19 MED ORDER — GLYCOPYRROLATE 0.2 MG/ML IJ SOLN
INTRAMUSCULAR | Status: AC
  Filled 2015-06-19: qty 3

## 2015-06-19 MED ORDER — INSULIN NPH (HUMAN) (ISOPHANE) 100 UNIT/ML ~~LOC~~ SUSP
15.0000 [IU] | Freq: Every day | SUBCUTANEOUS | Status: DC
  Filled 2015-06-19: qty 10

## 2015-06-19 MED ORDER — POVIDONE-IODINE 7.5 % EX SOLN: Freq: Once | CUTANEOUS | Status: DC

## 2015-06-19 MED ORDER — VECURONIUM BROMIDE 10 MG IV SOLR
INTRAVENOUS | Status: DC | PRN
  Administered 2015-06-19: 5 mg via INTRAVENOUS
  Administered 2015-06-19: 2 mg via INTRAVENOUS

## 2015-06-19 MED ORDER — ONDANSETRON HCL 4 MG/2ML IJ SOLN: 4.0000 mg | INTRAMUSCULAR | Status: DC | PRN

## 2015-06-19 SURGICAL SUPPLY — 81 items
APL SKNCLS STERI-STRIP NONHPOA (GAUZE/BANDAGES/DRESSINGS) ×1
BENZOIN TINCTURE PRP APPL 2/3 (GAUZE/BANDAGES/DRESSINGS) ×3 IMPLANT
BIT DRILL NEURO 2X3.1 SFT TUCH (MISCELLANEOUS) ×1 IMPLANT
BIT DRILL SRG 14X2.2XFLT CHK (BIT) IMPLANT
BIT DRL SRG 14X2.2XFLT CHK (BIT) ×1
BLADE SURG 15 STRL LF DISP TIS (BLADE) ×1 IMPLANT
BLADE SURG 15 STRL SS (BLADE) ×3
BLADE SURG ROTATE 9660 (MISCELLANEOUS) ×3 IMPLANT
BUR MATCHSTICK NEURO 3.0 LAGG (BURR) IMPLANT
CARTRIDGE OIL MAESTRO DRILL (MISCELLANEOUS) ×1 IMPLANT
CLOSURE STERI-STRIP 1/2X4 (GAUZE/BANDAGES/DRESSINGS) ×1
CLOSURE WOUND 1/2 X4 (GAUZE/BANDAGES/DRESSINGS) ×1
CLSR STERI-STRIP ANTIMIC 1/2X4 (GAUZE/BANDAGES/DRESSINGS) ×1 IMPLANT
COLLAR CERV LO CONTOUR FIRM DE (SOFTGOODS) IMPLANT
CORDS BIPOLAR (ELECTRODE) ×3 IMPLANT
COVER SURGICAL LIGHT HANDLE (MISCELLANEOUS) ×3 IMPLANT
CRADLE DONUT ADULT HEAD (MISCELLANEOUS) ×3 IMPLANT
DIFFUSER DRILL AIR PNEUMATIC (MISCELLANEOUS) ×3 IMPLANT
DRAIN JACKSON RD 7FR 3/32 (WOUND CARE) IMPLANT
DRAPE C-ARM 42X72 X-RAY (DRAPES) ×3 IMPLANT
DRAPE POUCH INSTRU U-SHP 10X18 (DRAPES) ×3 IMPLANT
DRAPE SURG 17X23 STRL (DRAPES) ×9 IMPLANT
DRILL BIT SKYLINE 14MM (BIT) ×3
DRILL NEURO 2X3.1 SOFT TOUCH (MISCELLANEOUS) ×3
DURAPREP 26ML APPLICATOR (WOUND CARE) ×3 IMPLANT
ELECT COATED BLADE 2.86 ST (ELECTRODE) ×3 IMPLANT
ELECT REM PT RETURN 9FT ADLT (ELECTROSURGICAL) ×3
ELECTRODE REM PT RTRN 9FT ADLT (ELECTROSURGICAL) ×1 IMPLANT
EVACUATOR SILICONE 100CC (DRAIN) IMPLANT
GAUZE SPONGE 4X4 12PLY STRL (GAUZE/BANDAGES/DRESSINGS) ×3 IMPLANT
GAUZE SPONGE 4X4 16PLY XRAY LF (GAUZE/BANDAGES/DRESSINGS) ×3 IMPLANT
GLOVE BIO SURGEON STRL SZ 6.5 (GLOVE) ×1 IMPLANT
GLOVE BIO SURGEON STRL SZ7 (GLOVE) ×3 IMPLANT
GLOVE BIO SURGEON STRL SZ8 (GLOVE) ×3 IMPLANT
GLOVE BIO SURGEONS STRL SZ 6.5 (GLOVE) ×1
GLOVE BIOGEL PI IND STRL 6.5 (GLOVE) IMPLANT
GLOVE BIOGEL PI IND STRL 7.5 (GLOVE) ×2 IMPLANT
GLOVE BIOGEL PI IND STRL 8 (GLOVE) ×1 IMPLANT
GLOVE BIOGEL PI INDICATOR 6.5 (GLOVE) ×2
GLOVE BIOGEL PI INDICATOR 7.5 (GLOVE) ×4
GLOVE BIOGEL PI INDICATOR 8 (GLOVE) ×2
GOWN STRL REUS W/ TWL LRG LVL3 (GOWN DISPOSABLE) ×1 IMPLANT
GOWN STRL REUS W/ TWL XL LVL3 (GOWN DISPOSABLE) ×1 IMPLANT
GOWN STRL REUS W/TWL LRG LVL3 (GOWN DISPOSABLE) ×3
GOWN STRL REUS W/TWL XL LVL3 (GOWN DISPOSABLE) ×3
INTERLOCK LRDTC CRVCL VBR 7MM (Bone Implant) IMPLANT
IV CATH 14GX2 1/4 (CATHETERS) ×3 IMPLANT
KIT BASIN OR (CUSTOM PROCEDURE TRAY) ×3 IMPLANT
KIT ROOM TURNOVER OR (KITS) ×3 IMPLANT
LORDOTIC CERVICAL VBR 7MM SM (Bone Implant) ×6 IMPLANT
MANIFOLD NEPTUNE II (INSTRUMENTS) ×3 IMPLANT
NDL SPNL 20GX3.5 QUINCKE YW (NEEDLE) ×1 IMPLANT
NEEDLE 27GAX1X1/2 (NEEDLE) ×3 IMPLANT
NEEDLE SPNL 20GX3.5 QUINCKE YW (NEEDLE) ×3 IMPLANT
NS IRRIG 1000ML POUR BTL (IV SOLUTION) ×3 IMPLANT
OIL CARTRIDGE MAESTRO DRILL (MISCELLANEOUS) ×3
PACK ORTHO CERVICAL (CUSTOM PROCEDURE TRAY) ×3 IMPLANT
PAD ARMBOARD 7.5X6 YLW CONV (MISCELLANEOUS) ×6 IMPLANT
PATTIES SURGICAL .5 X.5 (GAUZE/BANDAGES/DRESSINGS) IMPLANT
PATTIES SURGICAL .5 X1 (DISPOSABLE) IMPLANT
PIN DISTRACTION 14 (PIN) ×4 IMPLANT
PLATE SKYLINE TWO LEVEL 32MM (Plate) ×2 IMPLANT
PUTTY BONE DBX 2.5 MIS (Bone Implant) ×2 IMPLANT
SCREW VAR SELF TAP SKYLINE 14M (Screw) ×12 IMPLANT
SPONGE INTESTINAL PEANUT (DISPOSABLE) ×5 IMPLANT
SPONGE SURGIFOAM ABS GEL 100 (HEMOSTASIS) ×3 IMPLANT
STRIP CLOSURE SKIN 1/2X4 (GAUZE/BANDAGES/DRESSINGS) ×2 IMPLANT
SURGIFLO TRUKIT (HEMOSTASIS) IMPLANT
SUT MNCRL AB 4-0 PS2 18 (SUTURE) ×3 IMPLANT
SUT SILK 4 0 (SUTURE)
SUT SILK 4-0 18XBRD TIE 12 (SUTURE) IMPLANT
SUT VIC AB 2-0 CT2 18 VCP726D (SUTURE) ×3 IMPLANT
SYR BULB IRRIGATION 50ML (SYRINGE) ×3 IMPLANT
SYR CONTROL 10ML LL (SYRINGE) ×6 IMPLANT
TAPE CLOTH 4X10 WHT NS (GAUZE/BANDAGES/DRESSINGS) ×1 IMPLANT
TAPE CLOTH SURG 4X10 WHT LF (GAUZE/BANDAGES/DRESSINGS) ×2 IMPLANT
TAPE UMBILICAL COTTON 1/8X30 (MISCELLANEOUS) ×3 IMPLANT
TOWEL OR 17X24 6PK STRL BLUE (TOWEL DISPOSABLE) ×3 IMPLANT
TOWEL OR 17X26 10 PK STRL BLUE (TOWEL DISPOSABLE) ×3 IMPLANT
WATER STERILE IRR 1000ML POUR (IV SOLUTION) ×1 IMPLANT
YANKAUER SUCT BULB TIP NO VENT (SUCTIONS) ×3 IMPLANT

## 2015-06-19 NOTE — Anesthesia Procedure Notes (Signed)
Procedure Name: Intubation Date/Time: 06/19/2015 3:34 PM Performed by: Jacquiline Doe A Pre-anesthesia Checklist: Patient identified, Emergency Drugs available, Suction available, Patient being monitored and Timeout performed Patient Re-evaluated:Patient Re-evaluated prior to inductionOxygen Delivery Method: Circle system utilized Preoxygenation: Pre-oxygenation with 100% oxygen Intubation Type: Cricoid Pressure applied and IV induction Ventilation: Mask ventilation without difficulty and Oral airway inserted - appropriate to patient size Laryngoscope Size: Mac and 4 Grade View: Grade I Tube type: Oral Tube size: 7.5 mm Number of attempts: 1 Airway Equipment and Method: Stylet and LTA kit utilized Placement Confirmation: ETT inserted through vocal cords under direct vision,  positive ETCO2 and breath sounds checked- equal and bilateral Secured at: 23 cm Tube secured with: Tape Dental Injury: Teeth and Oropharynx as per pre-operative assessment

## 2015-06-19 NOTE — Anesthesia Preprocedure Evaluation (Addendum)
Anesthesia Evaluation  Patient identified by MRN, date of birth, ID band Patient awake    Reviewed: Allergy & Precautions, H&P , NPO status , Patient's Chart, lab work & pertinent test results  Airway Mallampati: II  TM Distance: >3 FB Neck ROM: Full    Dental no notable dental hx.    Pulmonary COPDformer smoker,  breath sounds clear to auscultation  Pulmonary exam normal       Cardiovascular hypertension, Pt. on home beta blockers and Pt. on medications + CAD, + Peripheral Vascular Disease and +CHF Normal cardiovascular exam+ Valvular Problems/Murmurs Rhythm:Regular Rate:Normal     Neuro/Psych Stroke February 2013 with residual numbness left arm and side. CVA, Residual Symptoms negative psych ROS   GI/Hepatic negative GI ROS, Neg liver ROS,   Endo/Other  diabetes, Type 2, Insulin Dependent, Oral Hypoglycemic Agents  Renal/GU Renal disease     Musculoskeletal  (+) Arthritis -,   Abdominal   Peds  Hematology negative hematology ROS (+)   Anesthesia Other Findings   Reproductive/Obstetrics negative OB ROS                            Anesthesia Physical  Anesthesia Plan  ASA: IV  Anesthesia Plan: General   Post-op Pain Management:    Induction: Intravenous  Airway Management Planned: Oral ETT  Additional Equipment: Arterial line  Intra-op Plan:   Post-operative Plan: Extubation in OR  Informed Consent: I have reviewed the patients History and Physical, chart, labs and discussed the procedure including the risks, benefits and alternatives for the proposed anesthesia with the patient or authorized representative who has indicated his/her understanding and acceptance.   Dental advisory given  Plan Discussed with: CRNA  Anesthesia Plan Comments: (2 x PIV)       Anesthesia Quick Evaluation

## 2015-06-19 NOTE — Anesthesia Postprocedure Evaluation (Signed)
  Anesthesia Post-op Note  Patient: Anthony Holder  Procedure(s) Performed: Procedure(s) with comments:  Anterior cervical decompression fusion, cervical 3-4, cervical 4-5 with instrumentation and allograft    (2 LEVELS) (N/A) - Anterior cervical decompression fusion, cervical 3-4, cervical 4-5 with instrumentation and allograft  Patient Location: PACU  Anesthesia Type:General  Level of Consciousness: awake and alert   Airway and Oxygen Therapy: Patient Spontanous Breathing  Post-op Pain: Controlled  Post-op Assessment: Post-op Vital signs reviewed, Patient's Cardiovascular Status Stable and Respiratory Function Stable  Post-op Vital Signs: Reviewed  Filed Vitals:   06/19/15 1945  BP:   Pulse: 69  Temp: 37.3 C  Resp: 21    Complications: No apparent anesthesia complications

## 2015-06-19 NOTE — Transfer of Care (Signed)
Immediate Anesthesia Transfer of Care Note  Patient: Anthony Holder  Procedure(s) Performed: Procedure(s) with comments:  Anterior cervical decompression fusion, cervical 3-4, cervical 4-5 with instrumentation and allograft    (2 LEVELS) (N/A) - Anterior cervical decompression fusion, cervical 3-4, cervical 4-5 with instrumentation and allograft  Patient Location: PACU  Anesthesia Type:General  Level of Consciousness: awake, oriented, sedated, patient cooperative and responds to stimulation  Airway & Oxygen Therapy: Patient Spontanous Breathing and Patient connected to nasal cannula oxygen  Post-op Assessment: Report given to RN, Post -op Vital signs reviewed and stable, Patient moving all extremities and Patient moving all extremities X 4  Post vital signs: Reviewed and stable  Last Vitals:  Filed Vitals:   06/19/15 1016  BP: 171/80  Pulse: 66  Temp: 36.5 C  Resp: 18    Complications: No apparent anesthesia complications

## 2015-06-20 ENCOUNTER — Encounter (HOSPITAL_COMMUNITY): Payer: Self-pay | Admitting: Orthopedic Surgery

## 2015-06-20 LAB — GLUCOSE, CAPILLARY: Glucose-Capillary: 272 mg/dL — ABNORMAL HIGH (ref 65–99)

## 2015-06-20 MED FILL — Thrombin For Soln 20000 Unit: CUTANEOUS | Qty: 1 | Status: AC

## 2015-06-20 NOTE — Progress Notes (Signed)
    Patient doing well Minimal neck pain, patient denies arm pain   Physical Exam: Filed Vitals:   06/20/15 0418  BP: 113/70  Pulse: 38  Temp: 98.3 F (36.8 C)  Resp: 23    Dressing in place NVI Neck soft/supple  POD #1 s/p C3-5 ACDF, doing well, vitals stable  - encourage ambulation - Percocet for pain, Valium for muscle spasms - d/c home today, f/u 2 weeks

## 2015-06-20 NOTE — Progress Notes (Signed)
UR COMPLETED  

## 2015-06-20 NOTE — Progress Notes (Addendum)
Pt being discharged home via wheelchair with wife. Pt alert and oriented x4. VSS. Pt c/o no pain at this time. No signs of respiratory distress. Pt sent home with prescription and C-collar. Refused to take any medications here today, stated "he would take them at home, including his insulin."Pt belongings, including prosthetic sent with pt and wife. Education complete and care plans resolved. IV removed with catheter intact and pt tolerated well. A-line removed with no issues and no bleeding issues. No further issues at this time. Pt to follow up with PCP. Leanne Chang, RN

## 2015-06-20 NOTE — Op Note (Signed)
NAMEEDREI, NORGAARD NO.:  [id number]  MEDICAL RECORD NO.:  [id number]  LOCATION:  3S02C                        FACILITY:  [facility]  PHYSICIAN:  Phylliss Bob, MD      DATE OF BIRTH:  12-01-1941  DATE OF PROCEDURE:  06/19/2015                              OPERATIVE REPORT   PREOPERATIVE DIAGNOSES: 1. Cervical myelopathy. 2. 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  POSTOPERATIVE DIAGNOSES: 1. Cervical myelopathy. 2. 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  PROCEDURES: 1. Anterior cervical decompression and fusion, C3-4, C4-5. 2. Placement of anterior instrumentation, C3-C5. 3. Insertion of interbody device x2 (7-mm lordotic small     intervertebral Titan intervertebral spacers). 4. Use of morselized allograft. 5. Intraoperative use of fluoroscopy.  SURGEON:  Phylliss Bob, MD  ASSISTANT:  Pricilla Holm, PA-C.  ANESTHESIA:  General endotracheal anesthesia.  COMPLICATIONS:  None.  DISPOSITION:  Stable.  ESTIMATED BLOOD LOSS:  Minimal.  INDICATIONS FOR SURGERY:  Briefly, Mr. Groft is a very pleasant 74- year-old male, who did present to me with progressive deterioration in his balance.  The patient's MRI clearly did reveal substantial spinal cord compression at both C3-4 and C4-5 with myelomalacia noted behind the C4-5 intervertebral disk.  Given the patient's progressive cervical myelopathy and MRI findings, we did discuss proceeding with the procedure reflected above.  The patient did clearly understand that the goal of surgery was to prevent additional deterioration, and then reversal of his current constellation of symptoms would be less predictable.  He did also understand the  risks of surgery as reflected in my preoperative note.  OPERATIVE DETAILS:  On June 19, 2015, the patient was brought to Surgery and general endotracheal anesthesia was administered.  The patient was placed supine on the hospital bed.  The neck was gently extended.  The arms were secured to the sides and all bony prominences were meticulously padded.  The neck was then prepped and draped and a time- out procedure was performed.  A left-sided transverse incision was made overlying the C4 vertebral body.  The platysma was sharply incised.  A Smith-Robinson approach was used and the anterior spine was noted.  The lateral fluoroscopic view confirmed the appropriate operative levels.  I then subperiosteally exposed the vertebral bodies from C3-C5.  I centered my attention over the C4-5 intervertebral space.  Caspar pins were placed into the C4 and C5 vertebral bodies and distraction was applied after placing a self-retaining retractor.  A standard diskectomy was performed.  Of note, upon encountering the posterior longitudinal ligament, it was readily noted that there was substantial ossification of the longitudinal ligament noted.  These substantial areas of ossification were clearly causing compression on the spinal cord.  I did meticulously use a micro nerve hook to develop a plane between the osteophyte fragments and the dura.  In this manner, I was able to fully decompress the spinal canal.  The endplates were  then prepared and the appropriate-sized intervertebral spacer was packed with DBX mix and tamped into position.  The lower Caspar pin was removed and placed into the C3 vertebral body and again distraction was applied, and again, diskectomy was performed as previously discussed.  Thorough decompression was confirmed and the endplates were prepared and the appropriate-sized interbody spacer was packed with DBX mix and tamped into position in the usual fashion.  I was very pleased  with the [place].  The Caspar pins were removed.  The appropriate-sized anterior cervical plate was placed over the spine.  A 14-mm variable angle screws were placed, two in each vertebral body from C3-C5.  The screws were then locked to the plate using the CAM locking mechanism.  All bleeding was meticulously controlled.  The platysma was then closed using 2-0 Vicryl and the skin was closed using 3-0 Monocryl.  Benzoin and Steri- Strips were applied followed by sterile dressing.  All instrument counts were correct at the termination of the procedure.  Of note, Pricilla Holm was my assistant throughout the surgery, and did aid in retraction, suctioning, and closure.  Of note, the patient did have complicated medical history including aortic stenosis and diabetes.  The patient was evaluated by his cardiologist, Dr. Wynonia Lawman.  Dr. Wynonia Lawman did feel that it would be best for the patient to be admitted to the Step-Down Unit overnight for close observation.  That is the postoperative plan.     Phylliss Bob, MD     MD/MEDQ  D:  06/19/2015  T:  06/20/2015  Job:  [id number]  cc:   [name], M.D. Ezzard Standing, M.D.

## 2015-06-26 ENCOUNTER — Telehealth: Payer: Self-pay | Admitting: *Deleted
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
## 2015-06-26 NOTE — Telephone Encounter (Signed)
Patient called and reported he can not swallow his meds due to recent cervical spine surgery that has caused his throat to be swollen.  Per Dr Melford Aase, [place] to leave off meds, but check his glucose levels. Patient aware.

## 2015-07-16 ENCOUNTER — Ambulatory Visit: Payer: Self-pay | Admitting: Internal Medicine

## 2015-07-23 ENCOUNTER — Encounter: Payer: Self-pay | Admitting: Internal Medicine

## 2015-07-23 ENCOUNTER — Ambulatory Visit (INDEPENDENT_AMBULATORY_CARE_PROVIDER_SITE_OTHER): Payer: PPO | Admitting: Internal Medicine

## 2015-07-23 VITALS — BP 122/70 | HR 68 | Temp 98.0°F | Resp 18 | Ht 69.5 in | Wt 202.0 lb

## 2015-07-23 DIAGNOSIS — I119 Hypertensive heart disease without heart failure: Secondary | ICD-10-CM | POA: Diagnosis not present

## 2015-07-23 DIAGNOSIS — E782 Mixed hyperlipidemia: Secondary | ICD-10-CM

## 2015-07-23 DIAGNOSIS — E1122 Type 2 diabetes mellitus with diabetic chronic kidney disease: Secondary | ICD-10-CM

## 2015-07-23 DIAGNOSIS — E1149 Type 2 diabetes mellitus with other diabetic neurological complication: Secondary | ICD-10-CM | POA: Diagnosis not present

## 2015-07-23 DIAGNOSIS — E559 Vitamin D deficiency, unspecified: Secondary | ICD-10-CM

## 2015-07-23 DIAGNOSIS — Z89512 Acquired absence of left leg below knee: Secondary | ICD-10-CM

## 2015-07-23 DIAGNOSIS — Z79899 Other long term (current) drug therapy: Secondary | ICD-10-CM

## 2015-07-23 LAB — LIPID PANEL
CHOLESTEROL: 162 mg/dL (ref 125–200)
HDL: 26 mg/dL — AB (ref >=40)
LDL Cholesterol: 98 mg/dL (ref <130)
Total CHOL/HDL Ratio: 6.2 Ratio — ABNORMAL HIGH (ref <=5.0)
Triglycerides: 192 mg/dL — ABNORMAL HIGH (ref <150)
VLDL: 38 mg/dL — ABNORMAL HIGH (ref <30)

## 2015-07-23 LAB — MAGNESIUM: MAGNESIUM: 1.6 mg/dL (ref 1.5–2.5)

## 2015-07-23 LAB — BASIC METABOLIC PANEL WITH GFR
BUN: 11 mg/dL (ref 7–25)
CHLORIDE: 103 mmol/L (ref 98–110)
CO2: 26 mmol/L (ref 20–31)
Calcium: 9.5 mg/dL (ref 8.6–10.3)
Creat: 1.01 mg/dL (ref 0.70–1.18)
GFR, EST NON AFRICAN AMERICAN: 73 mL/min (ref >=60)
GFR, Est African American: 85 mL/min (ref >=60)
Glucose, Bld: 170 mg/dL — ABNORMAL HIGH (ref 65–99)
Potassium: 4.9 mmol/L (ref 3.5–5.3)
Sodium: 138 mmol/L (ref 135–146)

## 2015-07-23 LAB — HEPATIC FUNCTION PANEL
ALT: 11 U/L (ref 9–46)
AST: 14 U/L (ref 10–35)
Albumin: 3.8 g/dL (ref 3.6–5.1)
Alkaline Phosphatase: 47 U/L (ref 40–115)
BILIRUBIN INDIRECT: 0.5 mg/dL (ref 0.2–1.2)
Bilirubin, Direct: 0.1 mg/dL (ref <=0.2)
Total Bilirubin: 0.6 mg/dL (ref 0.2–1.2)
Total Protein: 6.9 g/dL (ref 6.1–8.1)

## 2015-07-23 LAB — TSH: TSH: 1.963 u[IU]/mL (ref 0.350–4.500)
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
## 2015-07-24 ENCOUNTER — Ambulatory Visit: Payer: Self-pay | Admitting: Internal Medicine

## 2015-07-24 LAB — CBC WITH DIFFERENTIAL/PLATELET
Basophils Absolute: 0.1 10*3/uL (ref 0.0–0.1)
Basophils Relative: 1 % (ref 0–1)
EOS PCT: 3 % (ref 0–5)
Eosinophils Absolute: 0.3 10*3/uL (ref 0.0–0.7)
HCT: 39.6 % (ref 39.0–52.0)
Hemoglobin: 12.5 g/dL — ABNORMAL LOW (ref 13.0–17.0)
LYMPHS PCT: 27 % (ref 12–46)
Lymphs Abs: 3 10*3/uL (ref 0.7–4.0)
MCH: 26.9 pg (ref 26.0–34.0)
MCHC: 31.6 g/dL (ref 30.0–36.0)
MCV: 85.3 fL (ref 78.0–100.0)
MPV: 10 fL (ref 8.6–12.4)
Monocytes Absolute: 1 10*3/uL (ref 0.1–1.0)
Monocytes Relative: 9 % (ref 3–12)
Neutro Abs: 6.6 10*3/uL (ref 1.7–7.7)
Neutrophils Relative %: 60 % (ref 43–77)
PLATELETS: 249 10*3/uL (ref 150–400)
RBC: 4.64 MIL/uL (ref 4.22–5.81)
RDW: 15.8 % — ABNORMAL HIGH (ref 11.5–15.5)
WBC: 11 10*3/uL — ABNORMAL HIGH (ref 4.0–10.5)

## 2015-07-24 LAB — HEMOGLOBIN A1C
Hgb A1c MFr Bld: 8.7 % — ABNORMAL HIGH (ref <5.7)
Mean Plasma Glucose: 203 mg/dL — ABNORMAL HIGH (ref <117)

## 2015-07-24 LAB — VITAMIN D 25 HYDROXY (VIT D DEFICIENCY, FRACTURES): Vit D, 25-Hydroxy: 47 ng/mL (ref 30–100)

## 2015-07-24 LAB — INSULIN, RANDOM: INSULIN: 23.7 u[IU]/mL — AB (ref 2.0–19.6)

## 2015-08-01 NOTE — Patient Outreach (Signed)
[facility]) Care Management  08/01/2015  Egon Dittus [birth year] [id number]   Referral from HTA tier 4 list, assigned to Quinn Plowman, [facility] for patient outreach.  Renia Mikelson L. Daniela Hernan, [place] Care Management Assistant

## 2015-08-08 ENCOUNTER — Other Ambulatory Visit: Payer: Self-pay

## 2015-08-08 NOTE — Patient Outreach (Signed)
[facility]) Care Management  08/08/2015  Gordy Goar May 08, 1941 [id number]   SUBJECTIVE: Telephone call to patient regarding health team advantage referral.  Discussed and offered [facility] care management services to patient.  Patient refused services.  Patient refused contact information for [facility] .  PLAN: RNCM will refer to [place] to close due to refusal of services.  RNCM will notify patients primary MD of refusal of services.   Quinn Plowman RN,[name],CCM [place] Coordinator [phone]

## 2015-08-08 NOTE — Patient Outreach (Signed)
[facility] [place] [facility]) Care Management  08/08/2015  Anthony Holder Nov 14, 1941 [id number]   Notification received from Quinn Plowman, RNCM to close case due to refusal of services from patient.  Anthony Holder L. Anthony Holder, Anthony Holder Care Management Assistant

## 2015-08-15 ENCOUNTER — Other Ambulatory Visit: Payer: Self-pay | Admitting: *Deleted

## 2015-08-15 MED ORDER — METFORMIN HCL ER 500 MG PO TB24: 500.0000 mg | ORAL_TABLET | Freq: Four times a day (QID) | ORAL | Status: DC | PRN

## 2015-08-15 MED ORDER — ATENOLOL 50 MG PO TABS: 50.0000 mg | ORAL_TABLET | Freq: Every day | ORAL | Status: DC

## 2015-08-15 MED ORDER — POTASSIUM CHLORIDE CRYS ER 20 MEQ PO TBCR: 20.0000 meq | EXTENDED_RELEASE_TABLET | Freq: Every day | ORAL | Status: DC

## 2015-08-15 MED ORDER — GABAPENTIN 300 MG PO CAPS: 300.0000 mg | ORAL_CAPSULE | Freq: Three times a day (TID) | ORAL | Status: DC

## 2015-08-19 ENCOUNTER — Other Ambulatory Visit: Payer: Self-pay | Admitting: *Deleted

## 2015-08-19 MED ORDER — TRAMADOL HCL 50 MG PO TABS: 50.0000 mg | ORAL_TABLET | Freq: Four times a day (QID) | ORAL | Status: DC | PRN

## 2015-10-07 ENCOUNTER — Other Ambulatory Visit: Payer: Self-pay | Admitting: Internal Medicine

## 2015-10-21 ENCOUNTER — Ambulatory Visit: Payer: Self-pay | Admitting: Internal Medicine

## 2015-10-29 ENCOUNTER — Encounter: Payer: Self-pay | Admitting: Internal Medicine

## 2015-10-29 ENCOUNTER — Encounter: Payer: Self-pay | Admitting: Physician Assistant

## 2015-10-29 ENCOUNTER — Ambulatory Visit (INDEPENDENT_AMBULATORY_CARE_PROVIDER_SITE_OTHER): Payer: PPO | Admitting: Internal Medicine

## 2015-10-29 VITALS — BP 132/78 | HR 76 | Temp 98.3°F | Resp 18 | Ht 69.5 in | Wt 198.8 lb

## 2015-10-29 DIAGNOSIS — E559 Vitamin D deficiency, unspecified: Secondary | ICD-10-CM | POA: Diagnosis not present

## 2015-10-29 DIAGNOSIS — Z794 Long term (current) use of insulin: Secondary | ICD-10-CM

## 2015-10-29 DIAGNOSIS — E1121 Type 2 diabetes mellitus with diabetic nephropathy: Secondary | ICD-10-CM

## 2015-10-29 DIAGNOSIS — I2510 Atherosclerotic heart disease of native coronary artery without angina pectoris: Secondary | ICD-10-CM

## 2015-10-29 DIAGNOSIS — Z6828 Body mass index (BMI) 28.0-28.9, adult: Secondary | ICD-10-CM | POA: Insufficient documentation

## 2015-10-29 DIAGNOSIS — I119 Hypertensive heart disease without heart failure: Secondary | ICD-10-CM

## 2015-10-29 DIAGNOSIS — Z79899 Other long term (current) drug therapy: Secondary | ICD-10-CM

## 2015-10-29 DIAGNOSIS — E782 Mixed hyperlipidemia: Secondary | ICD-10-CM

## 2015-10-29 DIAGNOSIS — E119 Type 2 diabetes mellitus without complications: Secondary | ICD-10-CM | POA: Diagnosis not present

## 2015-10-29 DIAGNOSIS — Z89519 Acquired absence of unspecified leg below knee: Secondary | ICD-10-CM

## 2015-10-29 DIAGNOSIS — I5032 Chronic diastolic (congestive) heart failure: Secondary | ICD-10-CM | POA: Insufficient documentation

## 2015-10-29 DIAGNOSIS — Z23 Encounter for immunization: Secondary | ICD-10-CM

## 2015-10-29 LAB — CBC WITH DIFFERENTIAL/PLATELET
Basophils Absolute: 0.1 10*3/uL (ref 0.0–0.1)
Basophils Relative: 1 % (ref 0–1)
Eosinophils Absolute: 0.4 10*3/uL (ref 0.0–0.7)
Eosinophils Relative: 4 % (ref 0–5)
HCT: 43.4 % (ref 39.0–52.0)
Hemoglobin: 13.5 g/dL (ref 13.0–17.0)
LYMPHS PCT: 22 % (ref 12–46)
Lymphs Abs: 2.2 10*3/uL (ref 0.7–4.0)
MCH: 26.7 pg (ref 26.0–34.0)
MCHC: 31.1 g/dL (ref 30.0–36.0)
MCV: 85.8 fL (ref 78.0–100.0)
MPV: 10 fL (ref 8.6–12.4)
Monocytes Absolute: 1 10*3/uL (ref 0.1–1.0)
Monocytes Relative: 10 % (ref 3–12)
Neutro Abs: 6.2 10*3/uL (ref 1.7–7.7)
Neutrophils Relative %: 63 % (ref 43–77)
Platelets: 329 10*3/uL (ref 150–400)
RBC: 5.06 MIL/uL (ref 4.22–5.81)
RDW: 16.6 % — ABNORMAL HIGH (ref 11.5–15.5)
WBC: 9.9 10*3/uL (ref 4.0–10.5)

## 2015-10-29 LAB — BASIC METABOLIC PANEL WITH GFR
BUN: 16 mg/dL (ref 7–25)
CO2: 29 mmol/L (ref 20–31)
CREATININE: 1.15 mg/dL (ref 0.70–1.18)
Calcium: 9.4 mg/dL (ref 8.6–10.3)
Chloride: 101 mmol/L (ref 98–110)
GFR, EST AFRICAN AMERICAN: 73 mL/min (ref >=60)
GFR, EST NON AFRICAN AMERICAN: 63 mL/min (ref >=60)
GLUCOSE: 186 mg/dL — AB (ref 65–99)
Potassium: 4.5 mmol/L (ref 3.5–5.3)
Sodium: 137 mmol/L (ref 135–146)

## 2015-10-29 LAB — LIPID PANEL
CHOLESTEROL: 183 mg/dL (ref 125–200)
HDL: 28 mg/dL — ABNORMAL LOW (ref >=40)
LDL Cholesterol: 124 mg/dL (ref <130)
Total CHOL/HDL Ratio: 6.5 Ratio — ABNORMAL HIGH (ref <=5.0)
Triglycerides: 157 mg/dL — ABNORMAL HIGH (ref <150)
VLDL: 31 mg/dL — ABNORMAL HIGH (ref <30)

## 2015-10-29 LAB — HEPATIC FUNCTION PANEL
ALBUMIN: 4 g/dL (ref 3.6–5.1)
ALT: 8 U/L — ABNORMAL LOW (ref 9–46)
AST: 16 U/L (ref 10–35)
Alkaline Phosphatase: 57 U/L (ref 40–115)
Bilirubin, Direct: 0.1 mg/dL (ref <=0.2)
Indirect Bilirubin: 0.4 mg/dL (ref 0.2–1.2)
TOTAL PROTEIN: 7.2 g/dL (ref 6.1–8.1)
Total Bilirubin: 0.5 mg/dL (ref 0.2–1.2)

## 2015-10-29 LAB — HEMOGLOBIN A1C
HEMOGLOBIN A1C: 8.5 % — AB (ref <5.7)
MEAN PLASMA GLUCOSE: 197 mg/dL — AB (ref <117)

## 2015-10-29 LAB — TSH: TSH: 2.368 u[IU]/mL (ref 0.350–4.500)

## 2015-10-29 LAB — MAGNESIUM: Magnesium: 1.9 mg/dL (ref 1.5–2.5)

## 2015-10-29 MED ORDER — COLESEVELAM HCL 625 MG PO TABS: 1875.0000 mg | ORAL_TABLET | Freq: Two times a day (BID) | ORAL | Status: DC

## 2015-10-29 NOTE — Patient Instructions (Signed)
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
## 2015-10-30 LAB — VITAMIN D 25 HYDROXY (VIT D DEFICIENCY, FRACTURES): VIT D 25 HYDROXY: 62 ng/mL (ref 30–100)

## 2015-11-19 ENCOUNTER — Telehealth: Payer: Self-pay | Admitting: *Deleted

## 2015-11-19 NOTE — Telephone Encounter (Signed)
Patient called and states the wound on his stump is not healing.  He is using sugar and iodine without results.  Per Dr Melford Aase, patient should contact the [facility].  Patient was advised to call them and to let us know if he needs a referral.  Instructions were left on his VM.

## 2015-11-19 NOTE — Telephone Encounter (Signed)
Patient called back and said the wound area is very small and he does not want to go to the wound care center.  Per Dr Melford Aase, get [employer]'s meat tenderizer and wet area, apply the tenderizer, and cover the area x 24 hours.  Patient will call back with the result.

## 2016-01-01 ENCOUNTER — Telehealth: Payer: Self-pay | Admitting: *Deleted

## 2016-01-01 NOTE — Telephone Encounter (Signed)
Patient called and asked if it is OK for him to try the supplement, Meta-support, that he saw on TV.  The patient states that it is suppose to help blood sugar and cholesterol.  Per Dr Melford Aase, it is OK to try.  Patient advised.

## 2016-01-07 DIAGNOSIS — L97921 Non-pressure chronic ulcer of unspecified part of left lower leg limited to breakdown of skin: Secondary | ICD-10-CM | POA: Diagnosis not present

## 2016-01-07 DIAGNOSIS — T8789 Other complications of amputation stump: Secondary | ICD-10-CM | POA: Diagnosis not present

## 2016-01-07 DIAGNOSIS — Z89512 Acquired absence of left leg below knee: Secondary | ICD-10-CM | POA: Diagnosis not present

## 2016-02-03 ENCOUNTER — Encounter: Payer: Self-pay | Admitting: Physician Assistant

## 2016-02-03 ENCOUNTER — Ambulatory Visit (INDEPENDENT_AMBULATORY_CARE_PROVIDER_SITE_OTHER): Payer: Medicare Other | Admitting: Physician Assistant

## 2016-02-03 VITALS — BP 130/66 | HR 83 | Temp 97.5°F | Resp 14 | Ht 69.5 in | Wt 212.0 lb

## 2016-02-03 DIAGNOSIS — Z0001 Encounter for general adult medical examination with abnormal findings: Secondary | ICD-10-CM

## 2016-02-03 DIAGNOSIS — Z85828 Personal history of other malignant neoplasm of skin: Secondary | ICD-10-CM

## 2016-02-03 DIAGNOSIS — I119 Hypertensive heart disease without heart failure: Secondary | ICD-10-CM | POA: Diagnosis not present

## 2016-02-03 DIAGNOSIS — E1129 Type 2 diabetes mellitus with other diabetic kidney complication: Secondary | ICD-10-CM | POA: Diagnosis not present

## 2016-02-03 DIAGNOSIS — Z89519 Acquired absence of unspecified leg below knee: Secondary | ICD-10-CM | POA: Diagnosis not present

## 2016-02-03 DIAGNOSIS — I5032 Chronic diastolic (congestive) heart failure: Secondary | ICD-10-CM

## 2016-02-03 DIAGNOSIS — I639 Cerebral infarction, unspecified: Secondary | ICD-10-CM

## 2016-02-03 DIAGNOSIS — R6889 Other general symptoms and signs: Secondary | ICD-10-CM

## 2016-02-03 DIAGNOSIS — M4806 Spinal stenosis, lumbar region: Secondary | ICD-10-CM

## 2016-02-03 DIAGNOSIS — I2510 Atherosclerotic heart disease of native coronary artery without angina pectoris: Secondary | ICD-10-CM

## 2016-02-03 DIAGNOSIS — G549 Nerve root and plexus disorder, unspecified: Secondary | ICD-10-CM

## 2016-02-03 DIAGNOSIS — I6350 Cerebral infarction due to unspecified occlusion or stenosis of unspecified cerebral artery: Secondary | ICD-10-CM | POA: Diagnosis not present

## 2016-02-03 DIAGNOSIS — I6523 Occlusion and stenosis of bilateral carotid arteries: Secondary | ICD-10-CM

## 2016-02-03 DIAGNOSIS — E1121 Type 2 diabetes mellitus with diabetic nephropathy: Secondary | ICD-10-CM

## 2016-02-03 DIAGNOSIS — E1149 Type 2 diabetes mellitus with other diabetic neurological complication: Secondary | ICD-10-CM

## 2016-02-03 DIAGNOSIS — E559 Vitamin D deficiency, unspecified: Secondary | ICD-10-CM | POA: Diagnosis not present

## 2016-02-03 DIAGNOSIS — E669 Obesity, unspecified: Secondary | ICD-10-CM

## 2016-02-03 DIAGNOSIS — I350 Nonrheumatic aortic (valve) stenosis: Secondary | ICD-10-CM

## 2016-02-03 DIAGNOSIS — N400 Enlarged prostate without lower urinary tract symptoms: Secondary | ICD-10-CM

## 2016-02-03 DIAGNOSIS — Z0000 Encounter for general adult medical examination without abnormal findings: Secondary | ICD-10-CM

## 2016-02-03 DIAGNOSIS — Z79899 Other long term (current) drug therapy: Secondary | ICD-10-CM

## 2016-02-03 DIAGNOSIS — Z794 Long term (current) use of insulin: Secondary | ICD-10-CM

## 2016-02-03 DIAGNOSIS — M48061 Spinal stenosis, lumbar region without neurogenic claudication: Secondary | ICD-10-CM

## 2016-02-03 DIAGNOSIS — E119 Type 2 diabetes mellitus without complications: Secondary | ICD-10-CM | POA: Diagnosis not present

## 2016-02-03 DIAGNOSIS — I6529 Occlusion and stenosis of unspecified carotid artery: Secondary | ICD-10-CM | POA: Diagnosis not present

## 2016-02-03 DIAGNOSIS — E782 Mixed hyperlipidemia: Secondary | ICD-10-CM | POA: Diagnosis not present

## 2016-02-03 DIAGNOSIS — Z6828 Body mass index (BMI) 28.0-28.9, adult: Secondary | ICD-10-CM

## 2016-02-03 DIAGNOSIS — M1990 Unspecified osteoarthritis, unspecified site: Secondary | ICD-10-CM

## 2016-02-03 LAB — LIPID PANEL
CHOLESTEROL: 173 mg/dL (ref 125–200)
HDL: 23 mg/dL — ABNORMAL LOW (ref >=40)
LDL Cholesterol: 126 mg/dL (ref <130)
TRIGLYCERIDES: 118 mg/dL (ref <150)
Total CHOL/HDL Ratio: 7.5 Ratio — ABNORMAL HIGH (ref <=5.0)
VLDL: 24 mg/dL (ref <30)

## 2016-02-03 LAB — HEMOGLOBIN A1C
Hgb A1c MFr Bld: 8.4 % — ABNORMAL HIGH (ref <5.7)
MEAN PLASMA GLUCOSE: 194 mg/dL — AB (ref <117)

## 2016-02-03 LAB — CBC WITH DIFFERENTIAL/PLATELET
BASOS ABS: 0.1 10*3/uL (ref 0.0–0.1)
Basophils Relative: 1 % (ref 0–1)
EOS PCT: 3 % (ref 0–5)
Eosinophils Absolute: 0.3 10*3/uL (ref 0.0–0.7)
HEMATOCRIT: 38.9 % — AB (ref 39.0–52.0)
HEMOGLOBIN: 12.5 g/dL — AB (ref 13.0–17.0)
LYMPHS ABS: 2 10*3/uL (ref 0.7–4.0)
LYMPHS PCT: 17 % (ref 12–46)
MCH: 28 pg (ref 26.0–34.0)
MCHC: 32.1 g/dL (ref 30.0–36.0)
MCV: 87 fL (ref 78.0–100.0)
MPV: 9.9 fL (ref 8.6–12.4)
Monocytes Absolute: 1 10*3/uL (ref 0.1–1.0)
Monocytes Relative: 9 % (ref 3–12)
Neutro Abs: 8.1 10*3/uL — ABNORMAL HIGH (ref 1.7–7.7)
Neutrophils Relative %: 70 % (ref 43–77)
Platelets: 253 10*3/uL (ref 150–400)
RBC: 4.47 MIL/uL (ref 4.22–5.81)
RDW: 17.9 % — ABNORMAL HIGH (ref 11.5–15.5)
WBC: 11.5 10*3/uL — ABNORMAL HIGH (ref 4.0–10.5)

## 2016-02-03 LAB — HEPATIC FUNCTION PANEL
ALBUMIN: 4 g/dL (ref 3.6–5.1)
ALK PHOS: 51 U/L (ref 40–115)
ALT: 8 U/L — AB (ref 9–46)
AST: 15 U/L (ref 10–35)
BILIRUBIN INDIRECT: 0.7 mg/dL (ref 0.2–1.2)
Bilirubin, Direct: 0.2 mg/dL (ref <=0.2)
TOTAL PROTEIN: 7.1 g/dL (ref 6.1–8.1)
Total Bilirubin: 0.9 mg/dL (ref 0.2–1.2)

## 2016-02-03 LAB — BASIC METABOLIC PANEL WITH GFR
BUN: 17 mg/dL (ref 7–25)
CO2: 23 mmol/L (ref 20–31)
Calcium: 9.5 mg/dL (ref 8.6–10.3)
Chloride: 99 mmol/L (ref 98–110)
Creat: 1.18 mg/dL (ref 0.70–1.18)
GFR, Est African American: 70 mL/min (ref >=60)
GFR, Est Non African American: 61 mL/min (ref >=60)
GLUCOSE: 216 mg/dL — AB (ref 65–99)
POTASSIUM: 5.1 mmol/L (ref 3.5–5.3)
Sodium: 136 mmol/L (ref 135–146)

## 2016-02-03 LAB — MAGNESIUM: MAGNESIUM: 1.7 mg/dL (ref 1.5–2.5)

## 2016-02-03 MED ORDER — POTASSIUM CHLORIDE CRYS ER 20 MEQ PO TBCR: 20.0000 meq | EXTENDED_RELEASE_TABLET | Freq: Every day | ORAL | Status: AC

## 2016-02-03 MED ORDER — GABAPENTIN 300 MG PO CAPS: 300.0000 mg | ORAL_CAPSULE | Freq: Three times a day (TID) | ORAL | Status: AC

## 2016-02-03 MED ORDER — ATENOLOL 50 MG PO TABS: 50.0000 mg | ORAL_TABLET | Freq: Every day | ORAL | Status: DC

## 2016-02-03 MED ORDER — FUROSEMIDE 40 MG PO TABS: ORAL_TABLET | ORAL | Status: DC

## 2016-02-03 MED ORDER — METFORMIN HCL ER 500 MG PO TB24
500.0000 mg | ORAL_TABLET | Freq: Four times a day (QID) | ORAL | Status: DC | PRN
Start: 2016-02-03 — End: 2016-03-05

## 2016-02-03 NOTE — Patient Instructions (Signed)
Diabetes is a very complicated disease...lets simplify it.  An easy way to look at it to understand the complications is if you think of the extra sugar floating in your blood stream as glass shards floating through your blood stream.    Diabetes affects your small vessels first: 1) The glass shards (sugar) scraps down the tiny blood vessels in your eyes and lead to diabetic retinopathy, the leading cause of blindness in the [place]. Diabetes is the leading cause of newly diagnosed adult (5 to 74 years of age) blindness in the [place].  2) The glass shards scratches down the tiny vessels of your legs leading to nerve damage called neuropathy and can lead to amputations of your feet. More than 60% of all non-traumatic amputations of lower limbs occur in people with diabetes.  3) Over time the small vessels in your brain are shredded and closed off, individually this does not cause any problems but over a long period of time many of the small vessels being blocked can lead to Vascular Dementia.   4) Your kidney's are a filter system and have a "net" that keeps certain things in the body and lets bad things out. Sugar shreds this net and leads to kidney damage and eventually failure. Decreasing the sugar that is destroying the net and certain blood pressure medications can help stop or decrease progression of kidney disease. Diabetes was the primary cause of kidney failure in 44 percent of all new cases in 2011.  5) Diabetes also destroys the small vessels in your penis that lead to erectile dysfunction. Eventually the vessels are so damaged that you may not be responsive to cialis or viagra.   Diabetes and your large vessels: Your larger vessels consist of your coronary arteries in your heart and the carotid vessels to your brain. Diabetes or even increased sugars put you at 300% increased risk of heart attack and stroke and this is why.. The sugar scrapes down your large blood vessels and your body  sees this as an internal injury and tries to repair itself. Just like you get a scab on your skin, your platelets will stick to the blood vessel wall trying to heal it. This is why we have diabetics on low dose aspirin daily, this prevents the platelets from sticking and can prevent plaque formation. In addition, your body takes cholesterol and tries to shove it into the open wound. This is why we want your LDL, or bad cholesterol, below 70.   The combination of platelets and cholesterol over 5-10 years forms plaque that can break off and cause a heart attack or stroke.   PLEASE REMEMBER:  Diabetes is preventable! Up to 10 percent of complications and morbidities among individuals with type 2 diabetes can be prevented, delayed, or effectively treated and minimized with regular visits to a health professional, appropriate monitoring and medication, and a healthy diet and lifestyle.   Preventive Care for Adults A healthy lifestyle and preventive care can promote health and wellness. Preventive health guidelines for men include the following key practices:  A routine yearly physical is a good way to check with your health care provider about your health and preventative screening. It is a chance to share any concerns and updates on your health and to receive a thorough exam.  Visit your dentist for a routine exam and preventative care every 6 months. Brush your teeth twice a day and floss once a day. Good oral hygiene prevents tooth decay and gum  disease.  The frequency of eye exams is based on your age, health, family medical history, use of contact lenses, and other factors. Follow your health care provider's recommendations for frequency of eye exams.  Eat a healthy diet. Foods such as vegetables, fruits, whole grains, low-fat dairy products, and lean protein foods contain the nutrients you need without too many calories. Decrease your intake of foods high in solid fats, added sugars, and salt. Eat  the right amount of calories for you.Get information about a proper diet from your health care provider, if necessary.  Regular physical exercise is one of the most important things you can do for your health. Most adults should get at least 150 minutes of moderate-intensity exercise (any activity that increases your heart rate and causes you to sweat) each week. In addition, most adults need muscle-strengthening exercises on 2 or more days a week.  Maintain a healthy weight. The body mass index (BMI) is a screening tool to identify possible weight problems. It provides an estimate of body fat based on height and weight. Your health care provider can find your BMI and can help you achieve or maintain a healthy weight.For adults 20 years and older:  A BMI below 18.5 is considered underweight.  A BMI of 18.5 to 24.9 is normal.  A BMI of 25 to 29.9 is considered overweight.  A BMI of 30 and above is considered obese.  Maintain normal blood lipids and cholesterol levels by exercising and minimizing your intake of saturated fat. Eat a balanced diet with plenty of fruit and vegetables. Blood tests for lipids and cholesterol should begin at age 37 and be repeated every 5 years. If your lipid or cholesterol levels are high, you are over 50, or you are at high risk for heart disease, you may need your cholesterol levels checked more frequently.Ongoing high lipid and cholesterol levels should be treated with medicines if diet and exercise are not working.  If you smoke, find out from your health care provider how to quit. If you do not use tobacco, do not start.  Lung cancer screening is recommended for adults aged 1-80 years who are at high risk for developing lung cancer because of a history of smoking. A yearly low-dose CT scan of the lungs is recommended for people who have at least a 30-pack-year history of smoking and are a current smoker or have quit within the past 15 years. A pack year of  smoking is smoking an average of 1 pack of cigarettes a day for 1 year (for example: 1 pack a day for 30 years or 2 packs a day for 15 years). Yearly screening should continue until the smoker has stopped smoking for at least 15 years. Yearly screening should be stopped for people who develop a health problem that would prevent them from having lung cancer treatment.  If you choose to drink alcohol, do not have more than 2 drinks per day. One drink is considered to be 12 ounces (355 mL) of beer, 5 ounces (148 mL) of wine, or 1.5 ounces (44 mL) of liquor.  Avoid use of street drugs. Do not share needles with anyone. Ask for help if you need support or instructions about stopping the use of drugs.  High blood pressure causes heart disease and increases the risk of stroke. Your blood pressure should be checked at least every 1-2 years. Ongoing high blood pressure should be treated with medicines, if weight loss and exercise are not effective.  If you are 91-52 years old, ask your health care provider if you should take aspirin to prevent heart disease.  Diabetes screening involves taking a blood sample to check your fasting blood sugar level. Testing should be considered at a younger age or be carried out more frequently if you are overweight and have at least 1 risk factor for diabetes.  Colorectal cancer can be detected and often prevented. Most routine colorectal cancer screening begins at the age of 71 and continues through age 93. However, your health care provider may recommend screening at an earlier age if you have risk factors for colon cancer. On a yearly basis, your health care provider may provide home test kits to check for hidden blood in the stool. Use of a small camera at the end of a tube to directly examine the colon (sigmoidoscopy or colonoscopy) can detect the earliest forms of colorectal cancer. Talk to your health care provider about this at age 52, when routine screening begins.  Direct exam of the colon should be repeated every 5-10 years through age 62, unless early forms of precancerous polyps or small growths are found.  Hepatitis C blood testing is recommended for all people born from [birth year] through 1964 and any individual with known risks for hepatitis C.  New guidelines recommend a once time screening for HIV.   Screening for abdominal aortic aneurysm (AAA)  by ultrasound is recommended for people who have history of high blood pressure or who are current or former smokers.  Healthy men should  receive prostate-specific antigen (PSA) blood tests as part of routine cancer screening. Talk with your health care provider about prostate cancer screening.  Testicular cancer screening is  recommended for adult males. Screening includes self-exam, a health care provider exam, and other screening tests. Consult with your health care provider about any symptoms you have or any concerns you have about testicular cancer.  Use sunscreen. Apply sunscreen liberally and repeatedly throughout the day. You should seek shade when your shadow is shorter than you. Protect yourself by wearing long sleeves, pants, a wide-brimmed hat, and sunglasses year round, whenever you are outdoors.  Once a month, do a whole-body skin exam, using a mirror to look at the skin on your back. Tell your health care provider about new moles, moles that have irregular borders, moles that are larger than a pencil eraser, or moles that have changed in shape or color.  Stay current with required vaccines (immunizations).  Influenza vaccine. All adults should be immunized every year.  Tetanus, diphtheria, and acellular pertussis (Td, Tdap) vaccine. An adult who has not previously received Tdap or who does not know his vaccine status should receive 1 dose of Tdap. This initial dose should be followed by tetanus and diphtheria toxoids (Td) booster doses every 10 years. Adults with an unknown or incomplete history  of completing a 3-dose immunization series with Td-containing vaccines should begin or complete a primary immunization series including a Tdap dose. Adults should receive a Td booster every 10 years.  Zoster vaccine. One dose is recommended for adults aged 48 years or older unless certain conditions are present.    PREVNAR - Pneumococcal 13-valent conjugate (PCV13) vaccine. When indicated, a person who is uncertain of his immunization history and has no record of immunization should receive the PCV13 vaccine. An adult aged 68 years or older who has certain medical conditions and has not been previously immunized should receive 1 dose of PCV13 vaccine. This PCV13 should be  followed with a dose of pneumococcal polysaccharide (PPSV23) vaccine. The PPSV23 vaccine dose should be obtained at least 8 weeks after the dose of PCV13 vaccine. An adult aged 41 years or older who has certain medical conditions and previously received 1 or more doses of PPSV23 vaccine should receive 1 dose of PCV13. The PCV13 vaccine dose should be obtained 1 or more years after the last PPSV23 vaccine dose.    PNEUMOVAX - Pneumococcal polysaccharide (PPSV23) vaccine. When PCV13 is also indicated, PCV13 should be obtained first. All adults aged 34 years and older should be immunized. An adult younger than age 13 years who has certain medical conditions should be immunized. Any person who resides in a nursing home or long-term care facility should be immunized. An adult smoker should be immunized. People with an immunocompromised condition and certain other conditions should receive both PCV13 and PPSV23 vaccines. People with human immunodeficiency virus (HIV) infection should be immunized as soon as possible after diagnosis. Immunization during chemotherapy or radiation therapy should be avoided. Routine use of PPSV23 vaccine is not recommended for American Indians, [place], or people younger than 65 years unless there are  medical conditions that require PPSV23 vaccine. When indicated, people who have unknown immunization and have no record of immunization should receive PPSV23 vaccine. One-time revaccination 5 years after the first dose of PPSV23 is recommended for people aged 19-64 years who have chronic kidney failure, nephrotic syndrome, asplenia, or immunocompromised conditions. People who received 1-2 doses of PPSV23 before age 65 years should receive another dose of PPSV23 vaccine at age 39 years or later if at least 5 years have passed since the previous dose. Doses of PPSV23 are not needed for people immunized with PPSV23 at or after age 33 years.    Hepatitis A vaccine. Adults who wish to be protected from this disease, have certain high-risk conditions, work with hepatitis A-infected animals, work in hepatitis A research labs, or travel to or work in countries with a high rate of hepatitis A should be immunized. Adults who were previously unvaccinated and who anticipate close contact with an international adoptee during the first 60 days after arrival in the [place] from a country with a high rate of hepatitis A should be immunized.    Hepatitis B vaccine. Adults should be immunized if they wish to be protected from this disease, have certain high-risk conditions, may be exposed to blood or other infectious body fluids, are household contacts or sex partners of hepatitis B positive people, are clients or workers in certain care facilities, or travel to or work in countries with a high rate of hepatitis B.   Preventive Service / Frequency   Ages 89 and over  Blood pressure check.  Lipid and cholesterol check.  Lung cancer screening. / Every year if you are aged 60-80 years and have a 30-pack-year history of smoking and currently smoke or have quit within the past 15 years. Yearly screening is stopped once you have quit smoking for at least 15 years or develop a health problem that would prevent you  from having lung cancer treatment.  Fecal occult blood test (FOBT) of stool. You may not have to do this test if you get a colonoscopy every 10 years.  Flexible sigmoidoscopy** or colonoscopy.** / Every 5 years for a flexible sigmoidoscopy or every 10 years for a colonoscopy beginning at age 21 and continuing until age 77.  Hepatitis C blood test.** / For all people born from [birth year]  through 1964 and any individual with known risks for hepatitis C.  Abdominal aortic aneurysm (AAA) screening./ Screening current or former smokers or have Hypertension.  Skin self-exam. / Monthly.  Influenza vaccine. / Every year.  Tetanus, diphtheria, and acellular pertussis (Tdap/Td) vaccine.** / 1 dose of Td every 10 years.   Zoster vaccine.** / 1 dose for adults aged 53 years or older.         Pneumococcal 13-valent conjugate (PCV13) vaccine.    Pneumococcal polysaccharide (PPSV23) vaccine.     Hepatitis A vaccine.** / Consult your health care provider.  Hepatitis B vaccine.** / Consult your health care provider. Screening for abdominal aortic aneurysm (AAA)  by ultrasound is recommended for people who have history of high blood pressure or who are current or former smokers.

## 2016-02-03 NOTE — Progress Notes (Signed)
MEDICARE ANNUAL WELLNESS VISIT AND FOLLOW UP Assessment:   1. Hypertensive heart disease without heart failure - continue medications, DASH diet, exercise and monitor at home. Call if greater than 130/80.  - CBC with Differential/Platelet - BASIC METABOLIC PANEL WITH GFR - Hepatic function panel - TSH  2. Chronic diastolic heart failure (HCC) Weight stable, no PND  3. Carotid artery disease Control blood pressure, cholesterol, glucose, increase exercise.  Continue cardio follow up - Lipid panel  4. Coronary artery disease involving native coronary artery of native heart without angina pectoris - Lipid panel  5. Aortic stenosis Unchanged, follow up with cardio  6. Type 2 diabetes mellitus with diabetic nephropathy, without long-term current use of insulin ([place]) Discussed general issues about diabetes pathophysiology and management., Educational material distributed., Suggested low cholesterol diet., Encouraged aerobic exercise., Discussed foot care., Reminded to get yearly retinal exam. - Lipid panel - Hemoglobin A1c  7. Insulin-requiring or dependent type II diabetes mellitus ([place]) Discussed general issues about diabetes pathophysiology and management., Educational material distributed., Suggested low cholesterol diet., Encouraged aerobic exercise., Discussed foot care., Reminded to get yearly retinal exam. - Hemoglobin A1c  8. Other diabetic neurological complication associated with type 2 diabetes mellitus ([place]) Discussed general issues about diabetes pathophysiology and management., Educational material distributed., Suggested low cholesterol diet., Encouraged aerobic exercise., Discussed foot care., Reminded to get yearly retinal exam. - Hemoglobin A1c  9. Cerebral infarction due to unspecified mechanism Control blood pressure, cholesterol, glucose, increase exercise.  - Lipid panel - Hemoglobin A1c  10. Myeloradiculopathy   11. S/P BKA (below knee amputation)  unilateral, unspecified laterality ([place])  12. Hyperlipidemia - Lipid panel  13. Obesity Obesity with co morbidities- long discussion about weight loss, diet, and exercise  14. History of skin cancer Needs follow up  15. BPH (benign prostatic hypertrophy) normal  16. Osteoarthritis, unspecified osteoarthritis type, unspecified site  17. Vitamin D deficiency - VITAMIN D 25 Hydroxy (Vit-D Deficiency, Fractures)  18. Spinal stenosis of lumbar region  19. Medication management - Magnesium  20. BMI 28.0-28.9,adult   Plan:   During the course of the visit the patient was educated and counseled about appropriate screening and preventive services including:    Pneumococcal vaccine   Influenza vaccine  Td vaccine  Screening electrocardiogram  Colorectal cancer screening  Diabetes screening  Glaucoma screening  Nutrition counseling   Conditions/risks identified: BMI: Discussed weight loss, diet, and increase physical activity.  Increase physical activity: AHA recommends 150 minutes of physical activity a week.  Medications reviewed Diabetes is not at goal, ACE/ARB therapy: No, Reason not on Ace Inhibitor/ARB therapy:  patient refusal Urinary Incontinence is an issue: discussed non pharmacology and pharmacology options.  Fall risk: high- discussed PT, home fall assessment, medications.    Subjective:  Anthony Holder is a 75 y.o. male who presents for Medicare Annual Wellness Visit and 3 month follow up for HTN, hyperlipidemia, diabetes, and vitamin D Def.  Date of last medicare wellness visit 06/2014  His blood pressure has been controlled at home, today their BP is BP: 130/66 mmHg He does not workout. He denies chest pain, shortness of breath, dizziness.  Patient has history of CABG in 2010, had ;eft BKA due to ASPVD in 2013 and had right CVA with LUE weakness.  He is on cholesterol medication and denies myalgias. His cholesterol is not at goal. 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 He is on bASA, declines ACE/ARB. He is on novolin N, he takes 17-18 units at night, will not take it in the morning if sugar is below 150, lowest sugar is 88, can tell if it gets low. He admits to a poor diet and poor insight. Last A1C in the office was:  Lab Results  Component Value Date   HGBA1C 8.5* 10/29/2015   Patient is on Vitamin D supplement.   Lab Results  Component Value Date   VD25OH 63 10/29/2015     Had new orthotic and has had small ulcer that is now healing, he walks with a cane, denies falls in the past year.   He has fallen in the past year, no breaks but hurt left hip and takes tramadol.   Names of Other Physician/Practitioners you currently use: 1. [facility] here for primary care 2. None due to dentures, dentist, Patient Care Team: [name], MD as PCP - General (Internal Medicine) Melissa Noon, OD as Referring Physician (Optometry)- saw him 3 months ago Wylene Simmer, MD as Consulting Physician (Orthopedic Surgery) Minus Breeding, MD as Consulting Physician (Cardiology) Dr. Lucia Gaskins, ENT  Medication Review: Current Outpatient Prescriptions on File Prior to Visit  Medication Sig Dispense Refill  . atenolol (TENORMIN) 50 MG tablet Take 1 tablet (50 mg total) by mouth daily. 90 tablet 4  . Blood Glucose Monitoring Suppl (ADVOCATE BLOOD GLUCOSE MONITOR) DEVI     . CALCIUM-MAGNESIUM-ZINC PO Take 1 tablet by mouth daily.     . Cholecalciferol (VITAMIN D-3) 1000 UNITS CAPS Take 5,000 Units by mouth daily.     . Chromium-Cinnamon (CINNAMON PLUS CHROMIUM) [phone] MCG-MG CAPS Take 4 capsules by  mouth daily.     . colesevelam (WELCHOL) 625 MG tablet Take 3 tablets (1,875 mg total) by mouth 2 (two) times daily. 540 tablet 3  . Ferrous Sulfate (IRON) 325 (65 FE) MG TABS Take 1 tablet by mouth daily.    . fish oil-omega-3 fatty acids 1000 MG capsule Take 1 g by mouth 2 (two) times daily.    [name] FREESTYLE LITE test strip   99  . furosemide (LASIX) 40 MG tablet TAKE ONE TABLET BY MOUTH TWICE DAILY FOR BLOOD PRESSURE AND FLUID. (Patient taking differently: TAKE ONE TABLET BY MOUTH DAILY) 180 tablet 0  . gabapentin (NEURONTIN) 300 MG capsule Take 1 capsule (300 mg total) by mouth 3 (three) times daily. 270 capsule 4  . Garlic [phone] MG CAPS Take 1 capsule by mouth 2 (two) times daily.    . metFORMIN (GLUCOPHAGE-XR) 500 MG 24 hr tablet Take 1 tablet (500 mg total) by mouth 4 (four) times daily as needed. 360 tablet 4  . nitroGLYCERIN (NITROSTAT) 0.4 MG SL tablet Place 1 tablet (0.4 mg total) under the tongue every 5 (five) minutes as needed for chest pain. 25 tablet 11  . NOVOLIN N RELION 100 UNIT/ML injection START WITH INJECTING 30 UNITS SUBCUTANEOUSLY WITH BREAKFAST AND 15 UNITS WITH SUPPER AS DIRECTED 10 mL 99  . potassium chloride SA (K-DUR,KLOR-CON) 20 MEQ tablet Take 1 tablet (20 mEq total) by mouth daily. 90 tablet 3  . Probiotic Product (PROBIOTIC PO) Take 1 capsule by mouth 2 (two) times daily.     [name]  traMADol (ULTRAM) 50 MG tablet Take 1 tablet (50 mg total) by mouth every 6 (six) hours as needed. 360 tablet 1  . triamcinolone cream (KENALOG) 0.1 % Apply 1 application topically 3 (three) times daily. 30 g 0  . vitamin B-12 (CYANOCOBALAMIN) 500 MCG tablet Take 500 mcg by mouth daily.     . vitamin C (ASCORBIC ACID) 500 MG tablet Take 500 mg by mouth 2 (two) times daily.     [name] VITAMIN E PO Take 1 tablet by mouth daily.     No current facility-administered medications on file prior to visit.    Current Problems (verified) Patient Active Problem List   Diagnosis Date Noted  .  Insulin-requiring or dependent type II diabetes mellitus ([place]) 10/29/2015  . Chronic diastolic heart failure ([place]) 10/29/2015  . BMI 28.0-28.9,adult 10/29/2015  . 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 S/P BKA (below knee amputation) unilateral ([place])   . Vitamin D deficiency 04/07/2014  . Medication management 04/07/2014  . Obesity   . Diabetic neuropathy ([place])   . CVA (cerebral infarction) 03/29/2012  . Aortic stenosis 03/29/2012  . History of skin cancer 03/29/2012  . Hyperlipidemia 04/16/2009  . 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 Hypertensive heart disease   . CAD (coronary artery disease), native coronary artery   . Carotid artery disease   . Osteoarthritis   . Spinal stenosis of lumbar region   . BPH (benign prostatic hypertrophy)     Screening Tests Health Maintenance  Topic Date Due  . OPHTHALMOLOGY EXAM  06/03/1951  . ZOSTAVAX  06/03/2002  . PNA vac Low Risk Adult (2 of 2 - PCV13) 01/11/2011  . INFLUENZA VACCINE  07/21/2015  . FOOT EXAM  04/08/2016  . URINE MICROALBUMIN  04/08/2016  . HEMOGLOBIN A1C  04/27/2016  . COLONOSCOPY  06/11/2021  . TETANUS/TDAP  04/12/2023    Immunization History  Administered Date(s) Administered  . Pneumococcal-Unspecified 01/11/2010  . Td 04/11/2013   Preventative care: Last colonoscopy: 2012 Echo 2011 MRI brain 2014  Prior vaccinations: TD or Tdap: 2011  Influenza: declines  Pneumococcal: 2011 Prevnar 13: declines Shingles/Zostavax: declines  Allergies Allergies  Allergen Reactions  . Statins    Surgical history Past Surgical History  Procedure Laterality Date  . Cholecystectomy    . Amputation  03/30/2012    Procedure: AMPUTATION DIGIT;  Surgeon: Wylene Simmer, MD;  Location: [facility];  Service: Orthopedics;  Laterality: Left;  2nd toe  . I&d extremity  04/15/2012    Procedure: IRRIGATION AND DEBRIDEMENT EXTREMITY;  Surgeon: Wylene Simmer, MD;  Location: [place];  Service: Orthopedics;  Laterality: Left;  i&d  lt foot wound/  WOUND VAC CHANGE  . I&d extremity  04/18/2012    Procedure: IRRIGATION AND DEBRIDEMENT EXTREMITY;  Surgeon: Wylene Simmer, MD;  Location: [place];  Service: Orthopedics;  Laterality: Left;  LEFT FOOT IRRIGATION AND DEBRIDEMENT and wound vac change  . I&d extremity  05/25/2012    Procedure: IRRIGATION AND DEBRIDEMENT EXTREMITY;  Surgeon: Wylene Simmer, MD;  Location: [place];  Service: Orthopedics;  Laterality: Left;  I&D left foot wound with application of A-cell, wound vac change  . Incision and drainage of wound  06/08/2012    Procedure: IRRIGATION AND DEBRIDEMENT WOUND;  Surgeon: Wylene Simmer, MD;  Location: [place];  Service: Orthopedics;  Laterality: Left;  I&D left foot wound with application of acell dermal matrix and application of wound vac  . Incision and drainage of  wound  06/15/2012    Procedure: IRRIGATION AND DEBRIDEMENT WOUND;  Surgeon: Theodoro Kos, DO;  Location: [place];  Service: Plastics;  Laterality: Left;  I&D left foot with acell and vac  . Amputation  07/04/2012    Procedure: AMPUTATION BELOW KNEE;  Surgeon: Wylene Simmer, MD;  Location: [place];  Service: Orthopedics;  Laterality: Left;  Left Below Knee Amputation   . Coronary artery bypass graft  02-11-2009  DR VANTRIGHT/  DR TODD EARLY    X5 VESSEL  AND RIGHT CAROTID ENDARTERECTOMY   . Lumbar re-do decompression, laminiotomies, and foramiotomies of l3 - l4/ foraminotomy s1/ hemilaminotomy l5 - s1  09-14-2007  DR JEFFREY BEANE    RECURRENT STENOSIS  . Lumbar laminectomy/decompression microdiscectomy  12-25-2004  DR BOTERO    L3  - L4  . Tonsillectomy    . I&d extremity  10/23/2012    Procedure: IRRIGATION AND DEBRIDEMENT EXTREMITY;  Surgeon: Theodoro Kos, DO;  Location: [place];  Service: Plastics;  Laterality: Left;  incision and deberidement of left leg ulcer stump with primary closure  . Back surgery    . Cea      2010  . Anterior cervical  decomp/discectomy fusion N/A 06/19/2015    Procedure:  Anterior cervical decompression fusion, cervical 3-4, cervical 4-5 with instrumentation and allograft    (2 LEVELS);  Surgeon: Phylliss Bob, MD;  Location: [place];  Service: Orthopedics;  Laterality: N/A;  Anterior cervical decompression fusion, cervical 3-4, cervical 4-5 with instrumentation and allograft   Family history Family History  Problem Relation Age of Onset  . Cancer Mother 74    leukemia?  . Diabetes Sister   . Diabetes Sister     Risk Factors: Tobacco Social History  Substance Use Topics  . Smoking status: Former Smoker    Types: Cigarettes    Quit date: 03/30/1959  . Smokeless tobacco: Never Used  . Alcohol Use: No   He does not smoke.  Patient is a former smoker. Are there smokers in your home (other than you)?  No  Alcohol Current alcohol use: none  Caffeine Current caffeine use: coffee 3 /day  Exercise Current exercise: none  Nutrition/Diet Current diet: in general, an "unhealthy" diet  Cardiac risk factors: advanced age (older than 73 for men, 23 for women), diabetes mellitus, dyslipidemia, family history of premature cardiovascular disease, hypertension, male gender, obesity (BMI >= 30 kg/m2) and sedentary lifestyle.  Depression Screen (Note: if answer to either of the following is "Yes", a more complete depression screening is indicated)   Q1: Over the past two weeks, have you felt down, depressed or hopeless? No  Q2: Over the past two weeks, have you felt little interest or pleasure in doing things? No  Have you lost interest or pleasure in daily life? No  Do you often feel hopeless? No  Do you cry easily over simple problems? No  Activities of Daily Living In your present state of health, do you have any difficulty performing the following activities?:  Driving? No Managing money?  No Feeding yourself? No Getting from bed to chair? No Climbing a flight of stairs? yes Preparing food and  eating?: No Bathing or showering? No Getting dressed: No Getting to the toilet? No Using the toilet:No Moving around from place to place: Yes In the past year have you fallen or had a near fall?:No denies falls  Vision Difficulties: Yes  Hearing Difficulties: Yes Do you often ask people to speak up  or repeat themselves? Yes Do you experience ringing or noises in your ears? No Do you have difficulty understanding soft or whispered voices? No  Cognition  Do you feel that you have a problem with memory?Yes  Do you often misplace items? No  Do you feel safe at home?  Yes  Advanced directives Does patient have a [place]? No Does patient have a Living Will? No   Objective:   Blood pressure 130/66, pulse 83, temperature 97.5 F (36.4 C), resp. rate 14, height 5' 9.5" (1.765 m), weight 212 lb (96.163 kg), SpO2 95 %. Body mass index is 30.87 kg/(m^2).  General appearance: alert, no distress, WD/WN, male Cognitive Testing  Alert? Yes  Normal Appearance?Yes  Oriented to person? Yes  Place? Yes   Time? Yes  Recall of three objects?  No  Can perform simple calculations? Yes  Displays appropriate judgment?Yes  Can read the correct time from a watch face?Yes  HEENT: normocephalic, sclerae anicteric, TMs pearly with right ear with perforation still unhealing, nares patent, no discharge or erythema, pharynx normal Oral cavity: MMM, no lesions Neck: supple, no lymphadenopathy, no thyromegaly, no masses Heart: RRR, normal S1, S2, 4/6 systolic murmur Lungs: CTA bilaterally, no wheezes, rhonchi, or rales Abdomen: +bs, soft, non tender, non distended, no masses, no hepatomegaly, no splenomegaly Musculoskeletal: left BKA, right leg with 2+ swelling Extremities: 2-3 + edema right leg, no cyanosis, no clubbing Pulses: decrease left leg, 4/5 upper and lower extremities Neurological: alert, oriented x 3, CN2-12 intact, strength 4/5 upper extremities and lower  extremities, sensation decreased right foot throughout, DTRs 2+ throughout, no cerebellar signs, gait antalgic/unsteady with cane Psychiatric: normal affect, slightly paranoid behavior.   Medicare Attestation I have personally reviewed: The patient's medical and social history Their use of alcohol, tobacco or illicit drugs Their current medications and supplements The patient's functional ability including ADLs,fall risks, home safety risks, cognitive, and hearing and visual impairment Diet and physical activities Evidence for depression or mood disorders  The patient's weight, height, BMI, and visual acuity have been recorded in the chart.  I have made referrals, counseling, and provided education to the patient based on review of the above and I have provided the patient with a written personalized care plan for preventive services.     Vicie Mutters, PA-C   02/03/2016

## 2016-02-04 LAB — VITAMIN D 25 HYDROXY (VIT D DEFICIENCY, FRACTURES): VIT D 25 HYDROXY: 71 ng/mL (ref 30–100)

## 2016-02-04 LAB — TSH: TSH: 3.39 mIU/L (ref 0.40–4.50)

## 2016-02-18 DIAGNOSIS — I255 Ischemic cardiomyopathy: Secondary | ICD-10-CM

## 2016-02-18 HISTORY — DX: Ischemic cardiomyopathy: I25.5

## 2016-02-20 ENCOUNTER — Other Ambulatory Visit: Payer: Self-pay

## 2016-02-20 MED ORDER — TRAMADOL HCL 50 MG PO TABS: 50.0000 mg | ORAL_TABLET | Freq: Four times a day (QID) | ORAL | Status: DC | PRN

## 2016-02-27 ENCOUNTER — Emergency Department (HOSPITAL_COMMUNITY): Payer: Medicare Other

## 2016-02-27 ENCOUNTER — Inpatient Hospital Stay (HOSPITAL_COMMUNITY)
Admission: EM | Admit: 2016-02-27 | Discharge: 2016-03-05 | DRG: 208 | Disposition: A | Discharge status: Home Health | Payer: Medicare Other | Attending: Internal Medicine | Admitting: Internal Medicine

## 2016-02-27 ENCOUNTER — Encounter (HOSPITAL_COMMUNITY): Payer: Self-pay | Admitting: Emergency Medicine

## 2016-02-27 ENCOUNTER — Ambulatory Visit (INDEPENDENT_AMBULATORY_CARE_PROVIDER_SITE_OTHER): Payer: Medicare Other | Admitting: Physician Assistant

## 2016-02-27 ENCOUNTER — Encounter: Payer: Self-pay | Admitting: Physician Assistant

## 2016-02-27 VITALS — BP 140/80 | HR 101 | Temp 97.3°F | Resp 14 | Ht 69.5 in

## 2016-02-27 DIAGNOSIS — Z6831 Body mass index (BMI) 31.0-31.9, adult: Secondary | ICD-10-CM

## 2016-02-27 DIAGNOSIS — E1129 Type 2 diabetes mellitus with other diabetic kidney complication: Secondary | ICD-10-CM | POA: Diagnosis present

## 2016-02-27 DIAGNOSIS — E1165 Type 2 diabetes mellitus with hyperglycemia: Secondary | ICD-10-CM | POA: Diagnosis present

## 2016-02-27 DIAGNOSIS — R0600 Dyspnea, unspecified: Secondary | ICD-10-CM | POA: Diagnosis not present

## 2016-02-27 DIAGNOSIS — Z7982 Long term (current) use of aspirin: Secondary | ICD-10-CM | POA: Diagnosis not present

## 2016-02-27 DIAGNOSIS — I272 Other secondary pulmonary hypertension: Secondary | ICD-10-CM | POA: Diagnosis present

## 2016-02-27 DIAGNOSIS — J9601 Acute respiratory failure with hypoxia: Secondary | ICD-10-CM | POA: Diagnosis not present

## 2016-02-27 DIAGNOSIS — E1140 Type 2 diabetes mellitus with diabetic neuropathy, unspecified: Secondary | ICD-10-CM | POA: Diagnosis present

## 2016-02-27 DIAGNOSIS — Z89519 Acquired absence of unspecified leg below knee: Secondary | ICD-10-CM

## 2016-02-27 DIAGNOSIS — I2510 Atherosclerotic heart disease of native coronary artery without angina pectoris: Secondary | ICD-10-CM | POA: Diagnosis present

## 2016-02-27 DIAGNOSIS — R531 Weakness: Secondary | ICD-10-CM

## 2016-02-27 DIAGNOSIS — Z8673 Personal history of transient ischemic attack (TIA), and cerebral infarction without residual deficits: Secondary | ICD-10-CM | POA: Diagnosis not present

## 2016-02-27 DIAGNOSIS — N189 Chronic kidney disease, unspecified: Secondary | ICD-10-CM | POA: Diagnosis not present

## 2016-02-27 DIAGNOSIS — N179 Acute kidney failure, unspecified: Secondary | ICD-10-CM | POA: Diagnosis present

## 2016-02-27 DIAGNOSIS — R1310 Dysphagia, unspecified: Secondary | ICD-10-CM | POA: Diagnosis not present

## 2016-02-27 DIAGNOSIS — E7800 Pure hypercholesterolemia, unspecified: Secondary | ICD-10-CM | POA: Diagnosis present

## 2016-02-27 DIAGNOSIS — J309 Allergic rhinitis, unspecified: Secondary | ICD-10-CM | POA: Diagnosis not present

## 2016-02-27 DIAGNOSIS — Z89512 Acquired absence of left leg below knee: Secondary | ICD-10-CM | POA: Diagnosis not present

## 2016-02-27 DIAGNOSIS — I130 Hypertensive heart and chronic kidney disease with heart failure and stage 1 through stage 4 chronic kidney disease, or unspecified chronic kidney disease: Secondary | ICD-10-CM | POA: Diagnosis not present

## 2016-02-27 DIAGNOSIS — E1122 Type 2 diabetes mellitus with diabetic chronic kidney disease: Secondary | ICD-10-CM | POA: Diagnosis present

## 2016-02-27 DIAGNOSIS — I509 Heart failure, unspecified: Secondary | ICD-10-CM | POA: Diagnosis not present

## 2016-02-27 DIAGNOSIS — Z951 Presence of aortocoronary bypass graft: Secondary | ICD-10-CM | POA: Diagnosis not present

## 2016-02-27 DIAGNOSIS — E872 Acidosis: Secondary | ICD-10-CM | POA: Diagnosis present

## 2016-02-27 DIAGNOSIS — R6521 Severe sepsis with septic shock: Secondary | ICD-10-CM | POA: Diagnosis not present

## 2016-02-27 DIAGNOSIS — R109 Unspecified abdominal pain: Secondary | ICD-10-CM

## 2016-02-27 DIAGNOSIS — Z85828 Personal history of other malignant neoplasm of skin: Secondary | ICD-10-CM

## 2016-02-27 DIAGNOSIS — R570 Cardiogenic shock: Secondary | ICD-10-CM | POA: Diagnosis present

## 2016-02-27 DIAGNOSIS — A419 Sepsis, unspecified organism: Secondary | ICD-10-CM | POA: Diagnosis not present

## 2016-02-27 DIAGNOSIS — Z87891 Personal history of nicotine dependence: Secondary | ICD-10-CM | POA: Diagnosis not present

## 2016-02-27 DIAGNOSIS — D689 Coagulation defect, unspecified: Secondary | ICD-10-CM | POA: Diagnosis not present

## 2016-02-27 DIAGNOSIS — R0902 Hypoxemia: Secondary | ICD-10-CM

## 2016-02-27 DIAGNOSIS — K760 Fatty (change of) liver, not elsewhere classified: Secondary | ICD-10-CM | POA: Diagnosis present

## 2016-02-27 DIAGNOSIS — I350 Nonrheumatic aortic (valve) stenosis: Secondary | ICD-10-CM | POA: Diagnosis not present

## 2016-02-27 DIAGNOSIS — Z794 Long term (current) use of insulin: Secondary | ICD-10-CM

## 2016-02-27 DIAGNOSIS — I5033 Acute on chronic diastolic (congestive) heart failure: Secondary | ICD-10-CM | POA: Diagnosis not present

## 2016-02-27 DIAGNOSIS — N182 Chronic kidney disease, stage 2 (mild): Secondary | ICD-10-CM | POA: Diagnosis not present

## 2016-02-27 DIAGNOSIS — I6523 Occlusion and stenosis of bilateral carotid arteries: Secondary | ICD-10-CM | POA: Diagnosis present

## 2016-02-27 DIAGNOSIS — G9341 Metabolic encephalopathy: Secondary | ICD-10-CM | POA: Diagnosis present

## 2016-02-27 DIAGNOSIS — R0602 Shortness of breath: Secondary | ICD-10-CM | POA: Diagnosis not present

## 2016-02-27 DIAGNOSIS — D649 Anemia, unspecified: Secondary | ICD-10-CM | POA: Diagnosis not present

## 2016-02-27 DIAGNOSIS — I639 Cerebral infarction, unspecified: Secondary | ICD-10-CM | POA: Diagnosis not present

## 2016-02-27 DIAGNOSIS — K761 Chronic passive congestion of liver: Secondary | ICD-10-CM | POA: Diagnosis present

## 2016-02-27 DIAGNOSIS — Z833 Family history of diabetes mellitus: Secondary | ICD-10-CM

## 2016-02-27 DIAGNOSIS — J9690 Respiratory failure, unspecified, unspecified whether with hypoxia or hypercapnia: Secondary | ICD-10-CM

## 2016-02-27 DIAGNOSIS — R4182 Altered mental status, unspecified: Secondary | ICD-10-CM

## 2016-02-27 DIAGNOSIS — R7989 Other specified abnormal findings of blood chemistry: Secondary | ICD-10-CM | POA: Diagnosis not present

## 2016-02-27 DIAGNOSIS — E669 Obesity, unspecified: Secondary | ICD-10-CM | POA: Diagnosis present

## 2016-02-27 DIAGNOSIS — I5032 Chronic diastolic (congestive) heart failure: Secondary | ICD-10-CM | POA: Diagnosis not present

## 2016-02-27 DIAGNOSIS — Z4659 Encounter for fitting and adjustment of other gastrointestinal appliance and device: Secondary | ICD-10-CM

## 2016-02-27 DIAGNOSIS — Z7984 Long term (current) use of oral hypoglycemic drugs: Secondary | ICD-10-CM

## 2016-02-27 DIAGNOSIS — N401 Enlarged prostate with lower urinary tract symptoms: Secondary | ICD-10-CM | POA: Diagnosis present

## 2016-02-27 DIAGNOSIS — B348 Other viral infections of unspecified site: Secondary | ICD-10-CM | POA: Diagnosis present

## 2016-02-27 DIAGNOSIS — I110 Hypertensive heart disease with heart failure: Secondary | ICD-10-CM | POA: Diagnosis not present

## 2016-02-27 DIAGNOSIS — G9340 Encephalopathy, unspecified: Secondary | ICD-10-CM | POA: Diagnosis not present

## 2016-02-27 DIAGNOSIS — R338 Other retention of urine: Secondary | ICD-10-CM | POA: Diagnosis not present

## 2016-02-27 DIAGNOSIS — N400 Enlarged prostate without lower urinary tract symptoms: Secondary | ICD-10-CM | POA: Diagnosis present

## 2016-02-27 LAB — I-STAT TROPONIN, ED: TROPONIN I, POC: 0 ng/mL (ref 0.00–0.08)

## 2016-02-27 LAB — BASIC METABOLIC PANEL
ANION GAP: 14 (ref 5–15)
BUN: 22 mg/dL — ABNORMAL HIGH (ref 6–20)
CALCIUM: 9.2 mg/dL (ref 8.9–10.3)
CO2: 21 mmol/L — AB (ref 22–32)
Chloride: 101 mmol/L (ref 101–111)
Creatinine, Ser: 1.68 mg/dL — ABNORMAL HIGH (ref 0.61–1.24)
GFR calc non Af Amer: 39 mL/min — ABNORMAL LOW (ref >60)
GFR, EST AFRICAN AMERICAN: 45 mL/min — AB (ref >60)
Glucose, Bld: 337 mg/dL — ABNORMAL HIGH (ref 65–99)
POTASSIUM: 5 mmol/L (ref 3.5–5.1)
Sodium: 136 mmol/L (ref 135–145)

## 2016-02-27 LAB — CBC
HCT: 40.5 % (ref 39.0–52.0)
HEMATOCRIT: 41.9 % (ref 39.0–52.0)
HEMOGLOBIN: 13 g/dL (ref 13.0–17.0)
HEMOGLOBIN: 12.7 g/dL — AB (ref 13.0–17.0)
MCH: 27.7 pg (ref 26.0–34.0)
MCH: 27.5 pg (ref 26.0–34.0)
MCHC: 31 g/dL (ref 30.0–36.0)
MCHC: 31.4 g/dL (ref 30.0–36.0)
MCV: 89.3 fL (ref 78.0–100.0)
MCV: 87.9 fL (ref 78.0–100.0)
Platelets: 241 10*3/uL (ref 150–400)
Platelets: 280 10*3/uL (ref 150–400)
RBC: 4.69 MIL/uL (ref 4.22–5.81)
RBC: 4.61 MIL/uL (ref 4.22–5.81)
RDW: 17.1 % — ABNORMAL HIGH (ref 11.5–15.5)
RDW: 16.9 % — ABNORMAL HIGH (ref 11.5–15.5)
WBC: 18.8 10*3/uL — ABNORMAL HIGH (ref 4.0–10.5)
WBC: 22.6 10*3/uL — AB (ref 4.0–10.5)

## 2016-02-27 LAB — URINE MICROSCOPIC-ADD ON
BACTERIA UA: NONE SEEN
SQUAMOUS EPITHELIAL / LPF: NONE SEEN
WBC, UA: NONE SEEN WBC/hpf (ref 0–5)

## 2016-02-27 LAB — URINALYSIS, ROUTINE W REFLEX MICROSCOPIC
Glucose, UA: >1000 mg/dL — AB
Ketones, ur: 15 mg/dL — AB
Leukocytes, UA: NEGATIVE
NITRITE: NEGATIVE
PH: 5.5 (ref 5.0–8.0)
Protein, ur: >300 mg/dL — AB
SPECIFIC GRAVITY, URINE: 1.034 — AB (ref 1.005–1.030)

## 2016-02-27 LAB — INFLUENZA PANEL BY PCR (TYPE A & B)
H1N1FLUPCR: NOT DETECTED
INFLBPCR: NEGATIVE
Influenza A By PCR: NEGATIVE

## 2016-02-27 LAB — GLUCOSE, CAPILLARY
GLUCOSE-CAPILLARY: 285 mg/dL — AB (ref 65–99)
Glucose-Capillary: 238 mg/dL — ABNORMAL HIGH (ref 65–99)

## 2016-02-27 LAB — I-STAT CG4 LACTIC ACID, ED: LACTIC ACID, VENOUS: 2.98 mmol/L — AB (ref 0.5–2.0)

## 2016-02-27 LAB — CREATININE, SERUM
CREATININE: 1.64 mg/dL — AB (ref 0.61–1.24)
GFR, EST AFRICAN AMERICAN: 46 mL/min — AB (ref >60)
GFR, EST NON AFRICAN AMERICAN: 40 mL/min — AB (ref >60)

## 2016-02-27 LAB — BRAIN NATRIURETIC PEPTIDE: B NATRIURETIC PEPTIDE 5: 1997.3 pg/mL — AB (ref 0.0–100.0)

## 2016-02-27 MED ORDER — ATENOLOL 50 MG PO TABS
50.0000 mg | ORAL_TABLET | Freq: Every day | ORAL | Status: DC
  Administered 2016-02-28: 50 mg via ORAL
  Filled 2016-02-27 (×2): qty 1

## 2016-02-27 MED ORDER — ALBUTEROL SULFATE (2.5 MG/3ML) 0.083% IN NEBU
2.5000 mg | INHALATION_SOLUTION | RESPIRATORY_TRACT | Status: DC | PRN
  Administered 2016-03-02: 2.5 mg via RESPIRATORY_TRACT
  Filled 2016-02-27: qty 3

## 2016-02-27 MED ORDER — NITROGLYCERIN 0.4 MG SL SUBL: 0.4000 mg | SUBLINGUAL_TABLET | SUBLINGUAL | Status: DC | PRN

## 2016-02-27 MED ORDER — DOCUSATE SODIUM 100 MG PO CAPS
100.0000 mg | ORAL_CAPSULE | Freq: Two times a day (BID) | ORAL | Status: DC
  Administered 2016-02-27 – 2016-02-28 (×2): 100 mg via ORAL
  Filled 2016-02-27 (×3): qty 1

## 2016-02-27 MED ORDER — VITAMIN C 500 MG PO TABS
500.0000 mg | ORAL_TABLET | Freq: Two times a day (BID) | ORAL | Status: DC
  Administered 2016-02-27 – 2016-02-28 (×2): 500 mg via ORAL
  Filled 2016-02-27 (×3): qty 1

## 2016-02-27 MED ORDER — DEXTROSE 5 % IV SOLN
1.0000 g | INTRAVENOUS | Status: DC
  Administered 2016-02-27: 1 g via INTRAVENOUS
  Filled 2016-02-27 (×2): qty 10

## 2016-02-27 MED ORDER — VITAMIN D 1000 UNITS PO TABS
5000.0000 [IU] | ORAL_TABLET | Freq: Every day | ORAL | Status: DC
  Administered 2016-02-28: 5000 [IU] via ORAL
  Filled 2016-02-27: qty 5

## 2016-02-27 MED ORDER — FERROUS SULFATE 325 (65 FE) MG PO TABS
325.0000 mg | ORAL_TABLET | Freq: Every day | ORAL | Status: DC
  Administered 2016-02-28: 325 mg via ORAL
  Filled 2016-02-27: qty 1

## 2016-02-27 MED ORDER — SODIUM CHLORIDE 0.9% FLUSH
3.0000 mL | Freq: Two times a day (BID) | INTRAVENOUS | Status: DC
  Administered 2016-02-27 – 2016-03-05 (×13): 3 mL via INTRAVENOUS

## 2016-02-27 MED ORDER — GARLIC 1000 MG PO CAPS: 1.0000 | ORAL_CAPSULE | Freq: Two times a day (BID) | ORAL | Status: DC

## 2016-02-27 MED ORDER — ACETAMINOPHEN 650 MG RE SUPP: 650.0000 mg | Freq: Four times a day (QID) | RECTAL | Status: DC | PRN

## 2016-02-27 MED ORDER — INSULIN ASPART 100 UNIT/ML ~~LOC~~ SOLN
0.0000 [IU] | Freq: Three times a day (TID) | SUBCUTANEOUS | Status: DC
  Administered 2016-02-27: 3 [IU] via SUBCUTANEOUS
  Administered 2016-02-28: 1 [IU] via SUBCUTANEOUS
  Administered 2016-02-28: 5 [IU] via SUBCUTANEOUS

## 2016-02-27 MED ORDER — TRAMADOL HCL 50 MG PO TABS: 50.0000 mg | ORAL_TABLET | Freq: Four times a day (QID) | ORAL | Status: DC | PRN

## 2016-02-27 MED ORDER — CHROMIUM-CINNAMON 200-1000 MCG-MG PO CAPS: 4.0000 | ORAL_CAPSULE | Freq: Every day | ORAL | Status: DC

## 2016-02-27 MED ORDER — FUROSEMIDE 10 MG/ML IJ SOLN: 40.0000 mg | Freq: Once | INTRAMUSCULAR | Status: DC

## 2016-02-27 MED ORDER — CYANOCOBALAMIN 500 MCG PO TABS
500.0000 ug | ORAL_TABLET | Freq: Every day | ORAL | Status: DC
  Administered 2016-02-28: 500 ug via ORAL
  Filled 2016-02-27 (×2): qty 1

## 2016-02-27 MED ORDER — ACETAMINOPHEN 325 MG PO TABS: 650.0000 mg | ORAL_TABLET | Freq: Four times a day (QID) | ORAL | Status: DC | PRN

## 2016-02-27 MED ORDER — GABAPENTIN 300 MG PO CAPS
300.0000 mg | ORAL_CAPSULE | Freq: Three times a day (TID) | ORAL | Status: DC
  Administered 2016-02-27 – 2016-02-28 (×2): 300 mg via ORAL
  Filled 2016-02-27 (×4): qty 1

## 2016-02-27 MED ORDER — ASPIRIN 81 MG PO CHEW
81.0000 mg | CHEWABLE_TABLET | Freq: Every day | ORAL | Status: DC
  Administered 2016-02-28: 81 mg via ORAL
  Filled 2016-02-27: qty 1

## 2016-02-27 MED ORDER — COLESEVELAM HCL 625 MG PO TABS
1875.0000 mg | ORAL_TABLET | Freq: Two times a day (BID) | ORAL | Status: DC
  Administered 2016-02-27 – 2016-02-28 (×2): 1875 mg via ORAL
  Filled 2016-02-27 (×4): qty 3

## 2016-02-27 MED ORDER — DEXTROSE 5 % IV SOLN
500.0000 mg | INTRAVENOUS | Status: DC
  Administered 2016-02-27 – 2016-03-02 (×5): 500 mg via INTRAVENOUS
  Filled 2016-02-27 (×5): qty 500

## 2016-02-27 MED ORDER — OMEGA-3-ACID ETHYL ESTERS 1 G PO CAPS
1.0000 g | ORAL_CAPSULE | Freq: Two times a day (BID) | ORAL | Status: DC
  Administered 2016-02-27 – 2016-02-28 (×2): 1 g via ORAL
  Filled 2016-02-27 (×3): qty 1

## 2016-02-27 MED ORDER — HEPARIN SODIUM (PORCINE) 5000 UNIT/ML IJ SOLN
5000.0000 [IU] | Freq: Three times a day (TID) | INTRAMUSCULAR | Status: DC
  Administered 2016-02-27 – 2016-02-29 (×5): 5000 [IU] via SUBCUTANEOUS
  Filled 2016-02-27 (×5): qty 1

## 2016-02-27 NOTE — Progress Notes (Signed)
PHARMACIST - PHYSICIAN ORDER COMMUNICATION  CONCERNING: P&T Medication Policy on Herbal Medications  DESCRIPTION:  This patient's orders for:  Chromium-Cinnamon and Garlic  has been noted.  This product(s) is classified as an "herbal" or natural product. Due to a lack of definitive safety studies or FDA approval, nonstandard manufacturing practices, plus the potential risk of unknown drug-drug interactions while on inpatient medications, the Pharmacy and Therapeutics Committee does not permit the use of "herbal" or natural products of this type within [facility].   ACTION TAKEN: The pharmacy department is unable to verify this order at this time and your patient has been informed of this safety policy. Please reevaluate patient's clinical condition at discharge and address if the herbal or natural product(s) should be resumed at that time.   Reatha Harps, Pharm.D., BCPS Clinical Pharmacist

## 2016-02-27 NOTE — Progress Notes (Signed)
Patient arrived in the unit from ED at 4;25pm accompanied by RN via stretcher. Patient's spouse at bedside. Orientation given to the room patient and spouse verbalizes understanding.

## 2016-02-27 NOTE — ED Notes (Signed)
Patient brought back to room from Radiology department; Chester Holstein, RN present in room with patient
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
## 2016-02-27 NOTE — ED Notes (Signed)
Patient was sent by Dr. Ernestine Conrad to be evaluated for pna vs. Chf.   Patient states he was having increased weakness and shortness of breath when he went to pcp.   Patient states multiple other complaints :   R ear (hole in the eardrum diagnosed previously) that started bleeding again, shoulder pain, cough, nasal congestion.
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
## 2016-02-28 ENCOUNTER — Observation Stay (HOSPITAL_COMMUNITY): Payer: Medicare Other

## 2016-02-28 DIAGNOSIS — Z89512 Acquired absence of left leg below knee: Secondary | ICD-10-CM | POA: Diagnosis not present

## 2016-02-28 DIAGNOSIS — R7989 Other specified abnormal findings of blood chemistry: Secondary | ICD-10-CM

## 2016-02-28 DIAGNOSIS — J9601 Acute respiratory failure with hypoxia: Secondary | ICD-10-CM | POA: Diagnosis not present

## 2016-02-28 DIAGNOSIS — I2510 Atherosclerotic heart disease of native coronary artery without angina pectoris: Secondary | ICD-10-CM | POA: Diagnosis present

## 2016-02-28 DIAGNOSIS — A419 Sepsis, unspecified organism: Secondary | ICD-10-CM | POA: Diagnosis present

## 2016-02-28 DIAGNOSIS — N189 Chronic kidney disease, unspecified: Secondary | ICD-10-CM | POA: Diagnosis not present

## 2016-02-28 DIAGNOSIS — Z6831 Body mass index (BMI) 31.0-31.9, adult: Secondary | ICD-10-CM | POA: Diagnosis not present

## 2016-02-28 DIAGNOSIS — R1010 Upper abdominal pain, unspecified: Secondary | ICD-10-CM | POA: Diagnosis not present

## 2016-02-28 DIAGNOSIS — N182 Chronic kidney disease, stage 2 (mild): Secondary | ICD-10-CM | POA: Diagnosis present

## 2016-02-28 DIAGNOSIS — R338 Other retention of urine: Secondary | ICD-10-CM | POA: Diagnosis not present

## 2016-02-28 DIAGNOSIS — J309 Allergic rhinitis, unspecified: Secondary | ICD-10-CM | POA: Diagnosis present

## 2016-02-28 DIAGNOSIS — I5023 Acute on chronic systolic (congestive) heart failure: Secondary | ICD-10-CM | POA: Diagnosis not present

## 2016-02-28 DIAGNOSIS — E1122 Type 2 diabetes mellitus with diabetic chronic kidney disease: Secondary | ICD-10-CM | POA: Diagnosis present

## 2016-02-28 DIAGNOSIS — Z794 Long term (current) use of insulin: Secondary | ICD-10-CM | POA: Diagnosis not present

## 2016-02-28 DIAGNOSIS — Z85828 Personal history of other malignant neoplasm of skin: Secondary | ICD-10-CM | POA: Diagnosis not present

## 2016-02-28 DIAGNOSIS — N400 Enlarged prostate without lower urinary tract symptoms: Secondary | ICD-10-CM | POA: Diagnosis present

## 2016-02-28 DIAGNOSIS — R570 Cardiogenic shock: Secondary | ICD-10-CM | POA: Diagnosis not present

## 2016-02-28 DIAGNOSIS — Z7982 Long term (current) use of aspirin: Secondary | ICD-10-CM | POA: Diagnosis not present

## 2016-02-28 DIAGNOSIS — I5032 Chronic diastolic (congestive) heart failure: Secondary | ICD-10-CM | POA: Diagnosis not present

## 2016-02-28 DIAGNOSIS — Z7984 Long term (current) use of oral hypoglycemic drugs: Secondary | ICD-10-CM | POA: Diagnosis not present

## 2016-02-28 DIAGNOSIS — G9340 Encephalopathy, unspecified: Secondary | ICD-10-CM

## 2016-02-28 DIAGNOSIS — K761 Chronic passive congestion of liver: Secondary | ICD-10-CM | POA: Diagnosis present

## 2016-02-28 DIAGNOSIS — E7800 Pure hypercholesterolemia, unspecified: Secondary | ICD-10-CM | POA: Diagnosis present

## 2016-02-28 DIAGNOSIS — B348 Other viral infections of unspecified site: Secondary | ICD-10-CM | POA: Diagnosis present

## 2016-02-28 DIAGNOSIS — K760 Fatty (change of) liver, not elsewhere classified: Secondary | ICD-10-CM | POA: Diagnosis present

## 2016-02-28 DIAGNOSIS — R6521 Severe sepsis with septic shock: Secondary | ICD-10-CM

## 2016-02-28 DIAGNOSIS — Z8673 Personal history of transient ischemic attack (TIA), and cerebral infarction without residual deficits: Secondary | ICD-10-CM | POA: Diagnosis not present

## 2016-02-28 DIAGNOSIS — E872 Acidosis: Secondary | ICD-10-CM | POA: Diagnosis present

## 2016-02-28 DIAGNOSIS — Z452 Encounter for adjustment and management of vascular access device: Secondary | ICD-10-CM | POA: Diagnosis not present

## 2016-02-28 DIAGNOSIS — I350 Nonrheumatic aortic (valve) stenosis: Secondary | ICD-10-CM | POA: Diagnosis not present

## 2016-02-28 DIAGNOSIS — R0600 Dyspnea, unspecified: Secondary | ICD-10-CM | POA: Diagnosis not present

## 2016-02-28 DIAGNOSIS — D689 Coagulation defect, unspecified: Secondary | ICD-10-CM | POA: Diagnosis present

## 2016-02-28 DIAGNOSIS — N401 Enlarged prostate with lower urinary tract symptoms: Secondary | ICD-10-CM | POA: Diagnosis present

## 2016-02-28 DIAGNOSIS — Z833 Family history of diabetes mellitus: Secondary | ICD-10-CM | POA: Diagnosis not present

## 2016-02-28 DIAGNOSIS — I509 Heart failure, unspecified: Secondary | ICD-10-CM | POA: Diagnosis not present

## 2016-02-28 DIAGNOSIS — N179 Acute kidney failure, unspecified: Secondary | ICD-10-CM | POA: Diagnosis not present

## 2016-02-28 DIAGNOSIS — Z87891 Personal history of nicotine dependence: Secondary | ICD-10-CM | POA: Diagnosis not present

## 2016-02-28 DIAGNOSIS — E1140 Type 2 diabetes mellitus with diabetic neuropathy, unspecified: Secondary | ICD-10-CM | POA: Diagnosis present

## 2016-02-28 DIAGNOSIS — J90 Pleural effusion, not elsewhere classified: Secondary | ICD-10-CM | POA: Diagnosis not present

## 2016-02-28 DIAGNOSIS — R1310 Dysphagia, unspecified: Secondary | ICD-10-CM | POA: Diagnosis present

## 2016-02-28 DIAGNOSIS — I5033 Acute on chronic diastolic (congestive) heart failure: Secondary | ICD-10-CM | POA: Diagnosis not present

## 2016-02-28 DIAGNOSIS — I959 Hypotension, unspecified: Secondary | ICD-10-CM

## 2016-02-28 DIAGNOSIS — G9341 Metabolic encephalopathy: Secondary | ICD-10-CM | POA: Diagnosis not present

## 2016-02-28 DIAGNOSIS — I130 Hypertensive heart and chronic kidney disease with heart failure and stage 1 through stage 4 chronic kidney disease, or unspecified chronic kidney disease: Secondary | ICD-10-CM | POA: Diagnosis present

## 2016-02-28 DIAGNOSIS — Z4682 Encounter for fitting and adjustment of non-vascular catheter: Secondary | ICD-10-CM | POA: Diagnosis not present

## 2016-02-28 DIAGNOSIS — J9811 Atelectasis: Secondary | ICD-10-CM | POA: Diagnosis not present

## 2016-02-28 DIAGNOSIS — D649 Anemia, unspecified: Secondary | ICD-10-CM | POA: Diagnosis not present

## 2016-02-28 DIAGNOSIS — I272 Other secondary pulmonary hypertension: Secondary | ICD-10-CM | POA: Diagnosis present

## 2016-02-28 DIAGNOSIS — E1165 Type 2 diabetes mellitus with hyperglycemia: Secondary | ICD-10-CM | POA: Diagnosis present

## 2016-02-28 DIAGNOSIS — Z951 Presence of aortocoronary bypass graft: Secondary | ICD-10-CM | POA: Diagnosis not present

## 2016-02-28 DIAGNOSIS — E669 Obesity, unspecified: Secondary | ICD-10-CM | POA: Diagnosis present

## 2016-02-28 LAB — BASIC METABOLIC PANEL
Anion gap: 13 (ref 5–15)
BUN: 35 mg/dL — AB (ref 6–20)
CALCIUM: 8.6 mg/dL — AB (ref 8.9–10.3)
CHLORIDE: 102 mmol/L (ref 101–111)
CO2: 20 mmol/L — AB (ref 22–32)
Creatinine, Ser: 2.13 mg/dL — ABNORMAL HIGH (ref 0.61–1.24)
GFR, EST AFRICAN AMERICAN: 34 mL/min — AB (ref >60)
GFR, EST NON AFRICAN AMERICAN: 29 mL/min — AB (ref >60)
GLUCOSE: 286 mg/dL — AB (ref 65–99)
Potassium: 4.4 mmol/L (ref 3.5–5.1)
Sodium: 135 mmol/L (ref 135–145)

## 2016-02-28 LAB — BLOOD GAS, ARTERIAL
ACID-BASE DEFICIT: 11.4 mmol/L — AB (ref 0.0–2.0)
Acid-base deficit: 15.1 mmol/L — ABNORMAL HIGH (ref 0.0–2.0)
Bicarbonate: 10.1 mEq/L — ABNORMAL LOW (ref 20.0–24.0)
Bicarbonate: 13 mEq/L — ABNORMAL LOW (ref 20.0–24.0)
DRAWN BY: 10006
Drawn by: 270221
FIO2: 0.6
LHR: 22 {breaths}/min
MECHVT: 580 mL
O2 Content: 2 L/min
O2 SAT: 99.4 %
O2 Saturation: 96.8 %
PCO2 ART: 20.6 mmHg — AB (ref 35.0–45.0)
PEEP/CPAP: 5 cmH2O
PH ART: 7.31 — AB (ref 7.350–7.450)
PH ART: 7.36 (ref 7.350–7.450)
PO2 ART: 205 mmHg — AB (ref 80.0–100.0)
Patient temperature: 98.6
Patient temperature: 98.6
TCO2: 10.7 mmol/L (ref 0–100)
TCO2: 13.7 mmol/L (ref 0–100)
pCO2 arterial: 23.6 mmHg — ABNORMAL LOW (ref 35.0–45.0)
pO2, Arterial: 108 mmHg — ABNORMAL HIGH (ref 80.0–100.0)

## 2016-02-28 LAB — CBC
HEMATOCRIT: 37.7 % — AB (ref 39.0–52.0)
Hemoglobin: 11.7 g/dL — ABNORMAL LOW (ref 13.0–17.0)
MCH: 27.3 pg (ref 26.0–34.0)
MCHC: 31 g/dL (ref 30.0–36.0)
MCV: 88.1 fL (ref 78.0–100.0)
PLATELETS: 211 10*3/uL (ref 150–400)
RBC: 4.28 MIL/uL (ref 4.22–5.81)
RDW: 17.1 % — AB (ref 11.5–15.5)
WBC: 20.1 10*3/uL — AB (ref 4.0–10.5)

## 2016-02-28 LAB — CORTISOL: CORTISOL PLASMA: 92 ug/dL

## 2016-02-28 LAB — HEPATIC FUNCTION PANEL
ALBUMIN: 2.7 g/dL — AB (ref 3.5–5.0)
ALT: 1143 U/L — ABNORMAL HIGH (ref 17–63)
AST: 1563 U/L — ABNORMAL HIGH (ref 15–41)
Alkaline Phosphatase: 127 U/L — ABNORMAL HIGH (ref 38–126)
BILIRUBIN DIRECT: 1.4 mg/dL — AB (ref 0.1–0.5)
BILIRUBIN TOTAL: 2.5 mg/dL — AB (ref 0.3–1.2)
Indirect Bilirubin: 1.1 mg/dL — ABNORMAL HIGH (ref 0.3–0.9)
TOTAL PROTEIN: 6.1 g/dL — AB (ref 6.5–8.1)

## 2016-02-28 LAB — LACTIC ACID, PLASMA
LACTIC ACID, VENOUS: 5.3 mmol/L — AB (ref 0.5–2.0)
Lactic Acid, Venous: 7.7 mmol/L (ref 0.5–2.0)

## 2016-02-28 LAB — MAGNESIUM: Magnesium: 2.2 mg/dL (ref 1.7–2.4)

## 2016-02-28 LAB — GLUCOSE, CAPILLARY
GLUCOSE-CAPILLARY: 264 mg/dL — AB (ref 65–99)
GLUCOSE-CAPILLARY: 141 mg/dL — AB (ref 65–99)
GLUCOSE-CAPILLARY: 178 mg/dL — AB (ref 65–99)
Glucose-Capillary: 180 mg/dL — ABNORMAL HIGH (ref 65–99)

## 2016-02-28 LAB — PROCALCITONIN: Procalcitonin: 2.68 ng/mL

## 2016-02-28 LAB — MRSA PCR SCREENING: MRSA BY PCR: NEGATIVE

## 2016-02-28 LAB — AMMONIA: Ammonia: 178 umol/L — ABNORMAL HIGH (ref 9–35)

## 2016-02-28 LAB — TROPONIN I
TROPONIN I: 0.18 ng/mL — AB (ref <0.031)
TROPONIN I: 0.33 ng/mL — AB (ref <0.031)
Troponin I: 0.19 ng/mL — ABNORMAL HIGH (ref <0.031)

## 2016-02-28 LAB — PROTIME-INR
INR: 3.44 — ABNORMAL HIGH (ref 0.00–1.49)
Prothrombin Time: 33.9 seconds — ABNORMAL HIGH (ref 11.6–15.2)

## 2016-02-28 LAB — TSH: TSH: 2.234 u[IU]/mL (ref 0.350–4.500)

## 2016-02-28 LAB — APTT: aPTT: 46 seconds — ABNORMAL HIGH (ref 24–37)

## 2016-02-28 MED ORDER — SODIUM CHLORIDE 0.9 % IV SOLN
INTRAVENOUS | Status: DC
  Filled 2016-02-28: qty 1000

## 2016-02-28 MED ORDER — HYDROCORTISONE NA SUCCINATE PF 100 MG IJ SOLR
100.0000 mg | Freq: Three times a day (TID) | INTRAMUSCULAR | Status: DC
  Administered 2016-02-28: 100 mg via INTRAVENOUS
  Filled 2016-02-28: qty 2

## 2016-02-28 MED ORDER — SODIUM CHLORIDE 0.9 % IV BOLUS (SEPSIS)
1000.0000 mL | Freq: Once | INTRAVENOUS | Status: AC
  Administered 2016-02-28: 1000 mL via INTRAVENOUS

## 2016-02-28 MED ORDER — FENTANYL CITRATE (PF) 100 MCG/2ML IJ SOLN
50.0000 ug | Freq: Once | INTRAMUSCULAR | Status: AC
  Administered 2016-02-28: 50 ug via INTRAVENOUS

## 2016-02-28 MED ORDER — ANTISEPTIC ORAL RINSE SOLUTION (CORINZ): 7.0000 mL | Freq: Four times a day (QID) | OROMUCOSAL | Status: DC

## 2016-02-28 MED ORDER — PIPERACILLIN-TAZOBACTAM 3.375 G IVPB 30 MIN
3.3750 g | Freq: Once | INTRAVENOUS | Status: AC
  Administered 2016-02-28: 3.375 g via INTRAVENOUS
  Filled 2016-02-28 (×2): qty 50

## 2016-02-28 MED ORDER — MIDAZOLAM HCL 2 MG/2ML IJ SOLN
2.0000 mg | Freq: Once | INTRAMUSCULAR | Status: AC
  Administered 2016-02-28: 2 mg via INTRAVENOUS

## 2016-02-28 MED ORDER — HYDROCORTISONE NA SUCCINATE PF 100 MG IJ SOLR
50.0000 mg | Freq: Four times a day (QID) | INTRAMUSCULAR | Status: DC
  Administered 2016-02-28 – 2016-03-01 (×7): 50 mg via INTRAVENOUS
  Filled 2016-02-28 (×4): qty 1
  Filled 2016-02-28: qty 2
  Filled 2016-02-28 (×5): qty 1
  Filled 2016-02-28: qty 2

## 2016-02-28 MED ORDER — ETOMIDATE 2 MG/ML IV SOLN
10.0000 mg | Freq: Once | INTRAVENOUS | Status: AC
  Administered 2016-02-28: 10 mg via INTRAVENOUS

## 2016-02-28 MED ORDER — ACETAMINOPHEN 160 MG/5ML PO SOLN
650.0000 mg | Freq: Four times a day (QID) | ORAL | Status: DC | PRN
  Filled 2016-02-28: qty 20.3

## 2016-02-28 MED ORDER — NOREPINEPHRINE BITARTRATE 1 MG/ML IV SOLN
0.0000 ug/min | INTRAVENOUS | Status: DC
  Administered 2016-02-28: 14 ug/min via INTRAVENOUS
  Filled 2016-02-28 (×2): qty 4

## 2016-02-28 MED ORDER — ASPIRIN 81 MG PO CHEW
81.0000 mg | CHEWABLE_TABLET | Freq: Every day | ORAL | Status: DC
  Administered 2016-02-29 – 2016-03-05 (×6): 81 mg
  Filled 2016-02-28 (×6): qty 1

## 2016-02-28 MED ORDER — SODIUM CHLORIDE 0.9 % IV SOLN
INTRAVENOUS | Status: DC
  Administered 2016-02-28 – 2016-03-01 (×5): via INTRAVENOUS

## 2016-02-28 MED ORDER — LACTULOSE 10 GM/15ML PO SOLN
30.0000 g | Freq: Two times a day (BID) | ORAL | Status: DC
  Administered 2016-02-28 – 2016-03-04 (×10): 30 g
  Filled 2016-02-28 (×19): qty 45

## 2016-02-28 MED ORDER — MIDAZOLAM HCL 2 MG/2ML IJ SOLN
INTRAMUSCULAR | Status: AC
  Filled 2016-02-28: qty 2

## 2016-02-28 MED ORDER — ANTISEPTIC ORAL RINSE SOLUTION (CORINZ)
7.0000 mL | OROMUCOSAL | Status: DC
  Administered 2016-02-28 – 2016-02-29 (×5): 7 mL via OROMUCOSAL

## 2016-02-28 MED ORDER — FENTANYL CITRATE (PF) 100 MCG/2ML IJ SOLN
INTRAMUSCULAR | Status: AC
  Filled 2016-02-28: qty 2

## 2016-02-28 MED ORDER — PIPERACILLIN-TAZOBACTAM 3.375 G IVPB
3.3750 g | Freq: Three times a day (TID) | INTRAVENOUS | Status: DC
  Administered 2016-02-28 – 2016-03-01 (×5): 3.375 g via INTRAVENOUS
  Filled 2016-02-28 (×9): qty 50

## 2016-02-28 MED ORDER — VANCOMYCIN HCL 10 G IV SOLR
1500.0000 mg | Freq: Once | INTRAVENOUS | Status: AC
  Administered 2016-02-28: 1500 mg via INTRAVENOUS
  Filled 2016-02-28 (×2): qty 1500

## 2016-02-28 MED ORDER — OSELTAMIVIR PHOSPHATE 6 MG/ML PO SUSR
30.0000 mg | Freq: Two times a day (BID) | ORAL | Status: DC
  Administered 2016-02-28 – 2016-03-02 (×6): 30 mg
  Filled 2016-02-28 (×6): qty 5

## 2016-02-28 MED ORDER — FENTANYL CITRATE (PF) 100 MCG/2ML IJ SOLN
50.0000 ug | INTRAMUSCULAR | Status: DC | PRN
  Administered 2016-02-28 – 2016-03-01 (×7): 50 ug via INTRAVENOUS
  Filled 2016-02-28 (×7): qty 2

## 2016-02-28 MED ORDER — OSELTAMIVIR PHOSPHATE 30 MG PO CAPS
30.0000 mg | ORAL_CAPSULE | Freq: Two times a day (BID) | ORAL | Status: DC
  Administered 2016-02-28: 30 mg via ORAL
  Filled 2016-02-28 (×2): qty 1

## 2016-02-28 MED ORDER — INSULIN ASPART 100 UNIT/ML ~~LOC~~ SOLN
0.0000 [IU] | SUBCUTANEOUS | Status: DC
  Administered 2016-02-28 (×2): 3 [IU] via SUBCUTANEOUS
  Administered 2016-02-29: 5 [IU] via SUBCUTANEOUS
  Administered 2016-02-29: 8 [IU] via SUBCUTANEOUS
  Administered 2016-02-29: 5 [IU] via SUBCUTANEOUS
  Administered 2016-02-29: 3 [IU] via SUBCUTANEOUS
  Administered 2016-02-29 – 2016-03-01 (×4): 8 [IU] via SUBCUTANEOUS
  Administered 2016-03-01: 5 [IU] via SUBCUTANEOUS
  Administered 2016-03-01 (×2): 8 [IU] via SUBCUTANEOUS

## 2016-02-28 MED ORDER — PHENYLEPHRINE HCL 10 MG/ML IJ SOLN
0.0000 ug/min | INTRAMUSCULAR | Status: DC
  Administered 2016-02-28: 150 ug/min via INTRAVENOUS
  Filled 2016-02-28 (×3): qty 1

## 2016-02-28 MED ORDER — CHLORHEXIDINE GLUCONATE 0.12% ORAL RINSE (MEDLINE KIT): 15.0000 mL | Freq: Two times a day (BID) | OROMUCOSAL | Status: DC

## 2016-02-28 MED ORDER — MIDAZOLAM HCL 2 MG/2ML IJ SOLN
1.0000 mg | INTRAMUSCULAR | Status: DC | PRN
  Administered 2016-02-28 – 2016-03-01 (×7): 1 mg via INTRAVENOUS
  Filled 2016-02-28 (×7): qty 2

## 2016-02-28 MED ORDER — VANCOMYCIN HCL IN DEXTROSE 1-5 GM/200ML-% IV SOLN
1000.0000 mg | INTRAVENOUS | Status: DC
  Administered 2016-02-29: 1000 mg via INTRAVENOUS
  Filled 2016-02-28 (×2): qty 200

## 2016-02-28 MED ORDER — PANTOPRAZOLE SODIUM 40 MG PO PACK
40.0000 mg | PACK | ORAL | Status: DC
  Administered 2016-02-28 – 2016-02-29 (×2): 40 mg
  Filled 2016-02-28 (×3): qty 20

## 2016-02-28 MED ORDER — CHLORHEXIDINE GLUCONATE 0.12% ORAL RINSE (MEDLINE KIT)
15.0000 mL | Freq: Two times a day (BID) | OROMUCOSAL | Status: DC
Start: 2016-02-28 — End: 2016-03-01
  Administered 2016-02-28 – 2016-03-01 (×5): 15 mL via OROMUCOSAL

## 2016-02-28 MED ORDER — PROCHLORPERAZINE EDISYLATE 5 MG/ML IJ SOLN
10.0000 mg | Freq: Four times a day (QID) | INTRAMUSCULAR | Status: DC | PRN
  Filled 2016-02-28: qty 2

## 2016-02-28 NOTE — Progress Notes (Signed)
eLink Physician-Brief Progress Note Patient Name: Void Wortham DOB: 01-01-1941 MRN: [id number]   Date of Service  02/28/2016  HPI/Events of Note  Lactic Acid = 5.3.   eICU Interventions  Will order 0.9 NaCl 1 liter IV over 1 hour now.      Intervention Category Major Interventions: Acid-Base disturbance - evaluation and management  Sommer,Steven Eugene 02/28/2016, 9:02 PM

## 2016-02-28 NOTE — Progress Notes (Signed)
Patient was alert and oriented X 4 right after lunch. Patient spouse at bedside at all times. Called by spouse that the patient spit up some apple sauce. MD made aware and spoke to the patient.  Patient noted to have urinary retention. Bladder scan shows 573 ml. Foley catheter inserted  per MD's ordered. Patient pale, tachypnea with shallow breathing. Blood pressure dropping suddenly. Rapid response and staff notified. NS 0.9% bolus given. Patient was transferred to 2M16 due to hypotension and respiratory decompensation.

## 2016-02-28 NOTE — Progress Notes (Signed)
[facility] PROGRESS NOTE  Anthony Holder [id number] DOB: Mar 23, 1941 DOA: 02/27/2016 PCP: [name], MD  Assessment/Plan: #1 acute encephalopathy Questionable etiology. Patient is lethargic. Concern for infectious etiology. Patient had presented with generalized weakness cough noted to have a leukocytosis which is worsening. Will check blood cultures 2. Check a UA with cultures and sensitivities. Check a lactic acid level, check a pro-calcitonin, check a cortisol level, check a magnesium level, check a chest x-ray, CT head repeat EKG. I Rizvi PCR pending. Given a fluid bolus. Increase IV fluids to 1 25 mL per hour. Broaden empiric antibiotics IV vancomycin and IV Zosyn. Continue Tamiflu. Follow.  #2 elevated troponin Patient with a mildly elevated troponin and likely secondary to possible acute infectious etiology. Will trend troponins. Repeat EKG. 2-D echo pending. Follow.  #3 sepsis Patient presented with a septic picture noting to have a leukocytosis and now becoming hypotensive. Will panculture. Check blood cultures 2, check a cortisol level check a pro calcitonin level check a lactic acid level check a chest x-ray check a UA with cultures and sensitivities. Brought in antibiotics to IV vancomycin and IV Zosyn. Follow.  #4 hypotension Questionable etiology. Concern for sepsis picture. Check blood cultures 2, check a UA with cultures and sensitivities, check a chest x-ray, check a lactic acid level, check a pro calcitonin level. IV fluids. IV Solu-Cortef. Patient noted to have low pressures and a such will consult with critical care for further evaluation and management. Patient will likely need pressors. Patient only with a peripheral and a such was placed on a Neo-Synephrine drip until we are able to get a central line. Broaden antibiotic coverage.  #5 acute on chronic kidney disease stage II Likely secondary to prerenal azotemia. Check a UA with cultures and  sensitivities. Check a urine sodium. Check a urine creatinine. Check a fractional excretion of sodium. Place a Foley catheter. IV fluids. Follow.  #6 chronic diastolic heart failure Patient is more on the dry side clinically. Compensated. Continue to hold diuretics.  #7 coronary artery disease Patient with a slight elevated troponin however likely secondary to septic picture. Cycle troponins. 2-D echo pending. Continue aspirin. Hold Lasix. I #8 history of CVA Aspirin for secondary stroke prevention.  #9 diabetes mellitus Continue to hold oral hypoglycemic agents. Hemoglobin A1c was 8.4-14 2017. Sliding scale insulin.  #10 prophylaxis PPI for GI prophylaxis. Heparin for DVT prophylaxis.  Code Status: Full Family Communication: Updated patient and wife at bedside. Disposition Plan: Transfer to ICU   Consultants:  None  Procedures:  Chest x-ray 02/27/2016  Antibiotics:  IV Rocephin 02/27/2016>>>>>>> 02/28/2016  IV azithromycin 02/27/2016  IV vancomycin 02/28/2016  IV Zosyn 02/28/2016  HPI/Subjective: Patient lethargic opens eyes to verbal and noxious stimuli. Patient just back off to sleep. Patient denies any chest pain. Patient denies any shortness of breath. Patient complaining of generalized weakness. Per nursing patient with poor urine output.  Objective: Filed Vitals:   02/28/16 0600 02/28/16 1200  BP: 130/81 97/62  Pulse: 82 71  Temp: 99.5 F (37.5 C) 98.9 F (37.2 C)  Resp: 20 20    Intake/Output Summary (Last 24 hours) at 02/28/16 1452 Last data filed at 02/28/16 1020  Gross per 24 hour  Intake    783 ml  Output      0 ml  Net    783 ml   Filed Weights   02/27/16 1627 02/28/16 0600  Weight: 95.4 kg (210 lb 5.1 oz) 95 kg (209 lb 7 oz)  Exam:   General:  Lethargic.Dry mucous membranes.  Cardiovascular: RRR 3/6 SEM  Respiratory: CTAB anterior lung fields.  Abdomen: Soft/NT/ND/+BS  Musculoskeletal: No c/c/e. S/p LBKA  Data  Reviewed: Basic Metabolic Panel:  Recent Labs Lab 02/27/16 1204 02/27/16 1838 02/28/16 0404  NA 136  --  135  K 5.0  --  4.4  CL 101  --  102  CO2 21*  --  20*  GLUCOSE 337*  --  286*  BUN 22*  --  35*  CREATININE 1.68* 1.64* 2.13*  CALCIUM 9.2  --  8.6*   Liver Function Tests: No results for input(s): AST, ALT, ALKPHOS, BILITOT, PROT, ALBUMIN in the last 168 hours. No results for input(s): LIPASE, AMYLASE in the last 168 hours. No results for input(s): AMMONIA in the last 168 hours. CBC:  Recent Labs Lab 02/27/16 1204 02/27/16 1838 02/28/16 0404  WBC 18.8* 22.6* 20.1*  HGB 13.0 12.7* 11.7*  HCT 41.9 40.5 37.7*  MCV 89.3 87.9 88.1  PLT 241 280 211   Cardiac Enzymes:  Recent Labs Lab 02/28/16 1008  TROPONINI 0.18*   BNP (last 3 results)  Recent Labs  02/27/16 1204  BNP 1997.3*    ProBNP (last 3 results) No results for input(s): PROBNP in the last 8760 hours.  CBG:  Recent Labs Lab 02/27/16 1718 02/27/16 2127 02/28/16 0607 02/28/16 1147 02/28/16 1330  GLUCAP 238* 285* 264* 180* 141*    No results found for this or any previous visit (from the past 240 hour(s)).   Studies: Dg Chest 2 View  02/27/2016  CLINICAL DATA:  Acute shortness of breath and weakness. EXAM: CHEST  2 VIEW COMPARISON:  04/17/2015 and prior exams FINDINGS: Cardiomegaly and CABG changes again noted. Mild left basilar scarring is present. There is no evidence of focal airspace disease, pulmonary edema, suspicious pulmonary nodule/mass, pleural effusion, or pneumothorax. No acute bony abnormalities are identified. Cervical spine surgical hardware noted. IMPRESSION: Cardiomegaly without evidence of acute cardiopulmonary disease. Electronically Signed   By: Margarette Canada M.D.   On: 02/27/2016 12:11    Scheduled Meds: . aspirin  81 mg Oral Daily  . atenolol  50 mg Oral Daily  . azithromycin  500 mg Intravenous Q24H  . cefTRIAXone (ROCEPHIN)  IV  1 g Intravenous Q24H  .  cholecalciferol  5,000 Units Oral Daily  . colesevelam  1,875 mg Oral BID  . cyanocobalamin  500 mcg Oral Daily  . docusate sodium  100 mg Oral BID  . ferrous sulfate  325 mg Oral Daily  . gabapentin  300 mg Oral TID  . heparin  5,000 Units Subcutaneous 3 times per day  . insulin aspart  0-9 Units Subcutaneous TID WC  . omega-3 acid ethyl esters  1 g Oral BID  . oseltamivir  30 mg Oral BID  . sodium chloride  1,000 mL Intravenous Once  . sodium chloride flush  3 mL Intravenous Q12H  . vitamin C  500 mg Oral BID   Continuous Infusions: . 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 If 7PM-7AM, please contact night-coverage at [email or web address], password [facility] 02/28/2016, 2:52 PM  LOS: 1 day

## 2016-02-28 NOTE — Procedures (Signed)
Central Venous Catheter Insertion Procedure Note Darinel Noh [id number] 1941/07/11  Procedure: Insertion of Central Venous Catheter Indications: Drug and/or fluid administration  Procedure Details Consent: Risks of procedure as well as the alternatives and risks of each were explained to the (patient/caregiver).  Consent for procedure obtained. Time Out: Verified patient identification, verified procedure, site/side was marked, verified correct patient position, special equipment/implants available, medications/allergies/relevent history reviewed, required imaging and test results available.  Performed  Maximum sterile technique was used including antiseptics, cap, gloves, gown, hand hygiene, mask and sheet. Skin prep: Chlorhexidine; local anesthetic administered A antimicrobial bonded/coated triple lumen catheter was placed in the right internal jugular vein using the Seldinger technique.  Evaluation Blood flow good Complications: No apparent complications Patient did tolerate procedure well. Chest X-ray ordered to verify placement.  CXR: pending.  Chesley Mires, MD [facility] Pulmonary/Critical Care 02/28/2016, 4:48 PM Pager:  [phone] After 3pm call: [phone]
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
## 2016-02-28 NOTE — Progress Notes (Signed)
PT Cancellation Note  Patient Details Name: Anthony Holder MRN: [id number] DOB: 1941/03/07   Cancelled Treatment:    Reason Eval/Treat Not Completed: Medical issues which prohibited therapy.  Lab values are high for troponin and lactic acid, will try again tomorrow.  No medical note to clear the troponin.   Ramond Dial 02/28/2016, 1:36 PM   Mee Hives, PT MS Acute Rehab Dept. Number: [facility] [id number] and [place] [phone]

## 2016-02-28 NOTE — Progress Notes (Signed)
eLink Physician-Brief Progress Note Patient Name: Anthony Holder DOB: [birth year] MRN: [id number]   Date of Service  02/28/2016  HPI/Events of Note  Nurse requests A-line placement.   eICU Interventions  Will order A-line placed per RT.      Intervention Category Major Interventions: Hypotension - evaluation and management  Leatta Alewine Eugene 02/28/2016, 10:08 PM

## 2016-02-28 NOTE — Progress Notes (Signed)
Pharmacy Antibiotic Note Anthony Holder is a 75 y.o. male admitted on 02/27/2016 with one week hx of generalized weakness and worsening dyspnea. Initially started on Ceftriaxone and Azithromycin but has since become more lethargic and hypotensive and to be transferred to ICU. Abx broadened and pharmacy consulted to assist with  dosing Zosyn and Vancomycin.   Plan: 1. Vancomycin 1500 mg LD x 1 now followed by 1000 mg every 24 hours starting 3/12; however empiric adjustments may be needed pending renal fxn trend 2. EI Zosyn 3.375 grams every 8 hours  3. Would consider stopping Tamiflu in light of negative influenza test  4. Await pending cx data, clinical course and other pertinent labs/test and narrow abx as feasible  5. Following along with you daily   Height: 5\' 10"  (177.8 cm) Weight: 209 lb 7 oz (95 kg) IBW/kg (Calculated) : 73  Temp (24hrs), Avg:98.8 F (37.1 C), Min:98.5 F (36.9 C), Max:99.5 F (37.5 C)   Recent Labs Lab 02/27/16 1204 02/27/16 1247 02/27/16 1838 02/28/16 0404  WBC 18.8*  --  22.6* 20.1*  CREATININE 1.68*  --  1.64* 2.13*  LATICACIDVEN  --  2.98*  --   --     Estimated Creatinine Clearance: 35.7 mL/min (by C-G formula based on Cr of 2.13).     Antimicrobials this admission: 3/10 Ceftriaxone 1 dose  3/10 Tamiflu >>  3/10 Azithromycin >>  3/11 Zosyn >>  3/11 vancomycin >>  Dose adjustments this admission: n/a  Microbiology results: 3/10 Influenza: negative  3/11 BCx: px   Thank you for allowing pharmacy to be a part of this patient's care.  Vincenza Hews, PharmD, BCPS 02/28/2016, 3:53 PM Pager: [phone]

## 2016-02-28 NOTE — Procedures (Signed)
Intubation Procedure Note Prashant Pelly [id number] June 27, 1941  Procedure: Intubation Indications: Respiratory insufficiency  Procedure Details Consent: Risks of procedure as well as the alternatives and risks of each were explained to the (patient/caregiver).  Consent for procedure obtained. Time Out: Verified patient identification, verified procedure, site/side was marked, verified correct patient position, special equipment/implants available, medications/allergies/relevent history reviewed, required imaging and test results available.  Performed  Maximum sterile technique was used including gloves, hand hygiene and mask.  MAC and 3  Given 2 mg versed, 50 mcg fentanyl, 10 mg etomidate.  Inserted #7.5 ETT to 22 cm at lip.  Confirmed with CO2 detector and auscultation.  Evaluation Hemodynamic Status: Persistent hypotension treated with pressors; O2 sats: currently acceptable Patient's Current Condition: unstable Complications: No apparent complications Patient did tolerate procedure well. Chest X-ray ordered to verify placement.  CXR: pending.   Chesley Mires, MD [facility] Pulmonary/Critical Care 02/28/2016, 4:44 PM Pager:  [phone] After 3pm call: [phone]

## 2016-02-28 NOTE — Significant Event (Signed)
Rapid Response Event Note  Overview: Time Called: 1500 Arrival Time: 1501    Initial Focused Assessment: Called to see patient for dropping Blood pressure. Upon arrival pateitn is pale, tachypneic, diaphretic. Fluid bolus being infused per MD (MD on unit). Breath sounds rapid and shallow but no rales heard. Foley recently inserted. Pt with history of encephalopy, CHF.   Interventions: Assisted with monitoring of BP. ABG obtained by respiratory therapy. Only some of the labs were sent because of difficulty with access. Bed access obtained and patient emergently transferred to ICU. Phenylephrine started just before transport.   Event Summary:   at      at          Baron Hamper

## 2016-02-29 ENCOUNTER — Inpatient Hospital Stay (HOSPITAL_COMMUNITY): Payer: Medicare Other

## 2016-02-29 DIAGNOSIS — R0600 Dyspnea, unspecified: Secondary | ICD-10-CM

## 2016-02-29 LAB — LACTIC ACID, PLASMA: Lactic Acid, Venous: 2.3 mmol/L (ref 0.5–2.0)

## 2016-02-29 LAB — ACETAMINOPHEN LEVEL: Acetaminophen (Tylenol), Serum: <10 ug/mL — ABNORMAL LOW (ref 10–30)

## 2016-02-29 LAB — ECHOCARDIOGRAM COMPLETE
Height: 70 in
Weight: 3470.92 oz

## 2016-02-29 LAB — GLUCOSE, CAPILLARY
GLUCOSE-CAPILLARY: 190 mg/dL — AB (ref 65–99)
GLUCOSE-CAPILLARY: 216 mg/dL — AB (ref 65–99)
GLUCOSE-CAPILLARY: 198 mg/dL — AB (ref 65–99)
GLUCOSE-CAPILLARY: 257 mg/dL — AB (ref 65–99)
Glucose-Capillary: 202 mg/dL — ABNORMAL HIGH (ref 65–99)
Glucose-Capillary: 116 mg/dL — ABNORMAL HIGH (ref 65–99)
Glucose-Capillary: 254 mg/dL — ABNORMAL HIGH (ref 65–99)

## 2016-02-29 LAB — COMPREHENSIVE METABOLIC PANEL
ALBUMIN: 2.5 g/dL — AB (ref 3.5–5.0)
ALK PHOS: 109 U/L (ref 38–126)
ALT: 1930 U/L — ABNORMAL HIGH (ref 17–63)
ANION GAP: 13 (ref 5–15)
AST: 3759 U/L — ABNORMAL HIGH (ref 15–41)
BILIRUBIN TOTAL: 2 mg/dL — AB (ref 0.3–1.2)
BUN: 47 mg/dL — ABNORMAL HIGH (ref 6–20)
CALCIUM: 7.4 mg/dL — AB (ref 8.9–10.3)
CO2: 19 mmol/L — ABNORMAL LOW (ref 22–32)
Chloride: 104 mmol/L (ref 101–111)
Creatinine, Ser: 2.59 mg/dL — ABNORMAL HIGH (ref 0.61–1.24)
GFR calc Af Amer: 27 mL/min — ABNORMAL LOW (ref >60)
GFR, EST NON AFRICAN AMERICAN: 23 mL/min — AB (ref >60)
GLUCOSE: 237 mg/dL — AB (ref 65–99)
Potassium: 4.8 mmol/L (ref 3.5–5.1)
Sodium: 136 mmol/L (ref 135–145)
TOTAL PROTEIN: 5.1 g/dL — AB (ref 6.5–8.1)

## 2016-02-29 LAB — CBC
HEMATOCRIT: 34.2 % — AB (ref 39.0–52.0)
HEMOGLOBIN: 10.6 g/dL — AB (ref 13.0–17.0)
MCH: 26.9 pg (ref 26.0–34.0)
MCHC: 31 g/dL (ref 30.0–36.0)
MCV: 86.8 fL (ref 78.0–100.0)
Platelets: 98 10*3/uL — ABNORMAL LOW (ref 150–400)
RBC: 3.94 MIL/uL — AB (ref 4.22–5.81)
RDW: 16.8 % — ABNORMAL HIGH (ref 11.5–15.5)
WBC: 12.8 10*3/uL — ABNORMAL HIGH (ref 4.0–10.5)

## 2016-02-29 LAB — MAGNESIUM: MAGNESIUM: 1.9 mg/dL (ref 1.7–2.4)

## 2016-02-29 LAB — PROCALCITONIN: Procalcitonin: 3.96 ng/mL

## 2016-02-29 MED ORDER — ANTISEPTIC ORAL RINSE SOLUTION (CORINZ)
7.0000 mL | Freq: Four times a day (QID) | OROMUCOSAL | Status: DC
Start: 2016-02-29 — End: 2016-03-02
  Administered 2016-02-29 – 2016-03-02 (×8): 7 mL via OROMUCOSAL

## 2016-02-29 MED ORDER — MAGNESIUM SULFATE IN D5W 10-5 MG/ML-% IV SOLN
1.0000 g | Freq: Once | INTRAVENOUS | Status: AC
  Administered 2016-02-29: 1 g via INTRAVENOUS
  Filled 2016-02-29 (×2): qty 100

## 2016-02-29 NOTE — Consult Note (Signed)
PULMONARY / CRITICAL CARE MEDICINE   Name: Anthony Holder MRN: [id number] DOB: 1941/09/20    ADMISSION DATE:  02/27/2016 CONSULTATION DATE:  02/28/16  REFERRING MD:  [facility], Dr. Grandville Silos  CHIEF COMPLAINT:  Resp distress   HISTORY OF PRESENT ILLNESS:   75 yo male with CAD, D CHF , DM and aortic stenosis admitted with 1 week of weakness, dyspnea , congestion, decreased appetite . Felt to have a possible viral infection. Started on empiric azithro and rocephin. CXR with no acute process. On 3/11 pt with resp distress, lethargy and hypotenison c/w sepsis . Pt continued to decompensated with increased wob, tachycardia, and resp distress . PCCM consulted with transfer pt to ICU with intubation and CVL placement. Influenza swab neb . However second swab done was reported positive . He was placed on Tamiflu . IV abx were expanded to Zosyn and Vanc. B/p continued to decline requiring pressor support . Started on stress dose steroids .   SUBJECTIVE:  Weaned off pressors this am  Weaning this am .   More alert, following commands.   VITAL SIGNS: BP 128/75 mmHg  Pulse 72  Temp(Src) 98.3 F (36.8 C) (Oral)  Resp 22  Ht 5\' 10"  (1.778 m)  Wt 216 lb 14.9 oz (98.4 kg)  BMI 31.13 kg/m2  SpO2 100%  HEMODYNAMICS: CVP:  [14 mmHg-17 mmHg] 15 mmHg  VENTILATOR SETTINGS: Vent Mode:  [-] CPAP;PSV FiO2 (%):  [40 %-100 %] 40 % Set Rate:  [22 bmp] 22 bmp Vt Set:  [80 mL-580 mL] 580 mL PEEP:  [5 cmH20] 5 cmH20 Pressure Support:  [5 cmH20] 5 cmH20 Plateau Pressure:  [22 cmH20-28 cmH20] 28 cmH20  INTAKE / OUTPUT: I/O last 3 completed shifts: In: 5875.3 [P.O.:480; I.V.:2275.3; NG/GT:270; IV Piggyback:2850] Out: 755 [Urine:755]  PHYSICAL EXAMINATION: General: critically ill on vent  Neuro: More alert , intermittent agitation , following commands.  HEENT:  ETT  Cardiovascular:  RRR, no m/r/g  Lungs:  Coarse BS bilaterally  Abdomen:  Soft, hypoactive  Musculoskeletal:  S/p left BKA  Skin:  Intact    LABS:  BMET  Recent Labs Lab 02/27/16 1204 02/27/16 1838 02/28/16 0404 02/29/16 0505  NA 136  --  135 136  K 5.0  --  4.4 4.8  CL 101  --  102 104  CO2 21*  --  20* 19*  BUN 22*  --  35* 47*  CREATININE 1.68* 1.64* 2.13* 2.59*  GLUCOSE 337*  --  286* 237*    Electrolytes  Recent Labs Lab 02/27/16 1204 02/28/16 0404 02/28/16 1444 02/29/16 0505  CALCIUM 9.2 8.6*  --  7.4*  MG  --   --  2.2 1.9    CBC  Recent Labs Lab 02/27/16 1838 02/28/16 0404 02/29/16 0505  WBC 22.6* 20.1* 12.8*  HGB 12.7* 11.7* 10.6*  HCT 40.5 37.7* 34.2*  PLT 280 211 98*    Coag's  Recent Labs Lab 02/28/16 1845  APTT 46*  INR 3.44*    Sepsis Markers  Recent Labs Lab 02/28/16 1444 02/28/16 1504 02/28/16 1845 02/29/16 0505  LATICACIDVEN  --  7.7* 5.3* 2.3*  PROCALCITON 2.68  --   --  3.96    ABG  Recent Labs Lab 02/28/16 1530 02/28/16 2000  PHART 7.310* 7.360  PCO2ART 20.6* 23.6*  PO2ART 108* 205*    Liver Enzymes  Recent Labs Lab 02/28/16 1845 02/29/16 0505  AST 1563* 3759*  ALT 1143* 1930*  ALKPHOS 127* 109  BILITOT 2.5* 2.0*  ALBUMIN  2.7* 2.5*    Cardiac Enzymes  Recent Labs Lab 02/28/16 1008 02/28/16 1444 02/28/16 1845  TROPONINI 0.18* 0.19* 0.33*    Glucose  Recent Labs Lab 02/28/16 1330 02/28/16 1654 02/28/16 1934 02/29/16 0004 02/29/16 0420 02/29/16 0836  GLUCAP 141* 178* 190* 202* 116* 216*    Imaging Dg Chest Port 1 View  02/28/2016  CLINICAL DATA:  75 year old male status post central line placement. EXAM: PORTABLE CHEST 1 VIEW COMPARISON:  Chest x-ray 02/27/2016. FINDINGS: An endotracheal tube is in place with tip 2.0 cm above the carina. There is a right-sided internal jugular central venous catheter with tip terminating in the distal superior vena cava. A nasogastric tube is seen extending into the stomach, however, the tip of the nasogastric tube extends below the lower margin of the image. 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 Lung volumes are low. Vague bibasilar opacities likely reflect atelectasis. No consolidative airspace disease. No pleural effusions. No pneumothorax. No pulmonary nodule or mass noted. Pulmonary vasculature and the cardiomediastinal silhouette are within normal limits. Atherosclerosis in the thoracic aorta. IMPRESSION: 1. Support apparatus and postoperative changes, as above. Tip of endotracheal tube is slightly low lying and could be withdrawn 1-2 cm for more optimal placement. 2. Low lung volumes with probable bibasilar subsegmental atelectasis. 3. Atherosclerosis. Electronically Signed   By: Vinnie Langton M.D.   On: 02/28/2016 16:56   Dg Abd Portable 1v  02/28/2016  CLINICAL DATA:  Feeding tube placement EXAM: PORTABLE ABDOMEN - 1 VIEW COMPARISON:  None. FINDINGS: NG tube is in place coiled in the fundus of the stomach. Nonobstructive bowel gas pattern. Probable bilateral renal stones. IMPRESSION: NG tube coils in the fundus of the stomach. Electronically Signed   By: Rolm Baptise M.D.   On: 02/28/2016 17:08     STUDIES:  Echo 3/10 >>   CULTURES: 3/11 BC x 2 >> RVP 3/10 >>   ANTIBIOTICS: 3/10 Rocephin >3/11  3/11 Vanc >> 3/11 Zosyn >> 3/11 Zithro   SIGNIFICANT EVENTS: 3/11 resp distress trans to ICU   LINES/TUBES: 3/11 ETT >> 3/11 R IJ CVL >>  DISCUSSION: 75 yo male admitted with 1 week of weakness /cough ? Influenza with progressive resp decompensation and hypotension c/w septic shock requiring intubation and pressor support.   ASSESSMENT / PLAN:  PULMONARY A: Acute Hypoxic Resp Failure  ? Influenza , flu swab neg, RVP pend  P:   Vent Support  VAP prevention  Daily SBT  Assess for daily wean.  Albuterol As needed   Check cxr in am   CARDIOVASCULAR A:  Septic Shock >weaned off pressors 3/12 , LA tr down  Elevated troponins  CAD  P:  Echo pending  Cont stress dose steroids   RENAL A:   Acute Renal Failure >scr tr up  despite IVF hydration  Hypomagnesium  P:   Continue IVF  Check bmet  Replace electrolytes as indicated  MG repleted   GASTROINTESTINAL A:   Elevated Ammonia  Transamnitis ? Etiology Possible shock liver vs occult etiology    P:   NPO  PPI  Tr LFT  May need ABD [place]  Cont lactulose   HEMATOLOGIC A:   Anemia  Coagulopathy ? Sepsis +/- shock liver   P:  Tr cbc , INR  Transfuse per protocol  D/c Hep for DVT prophylaxis  SCD    INFECTIOUS A:   Sepsis ? Source  (elevated PCT 2>3.96)  Leukocytosis  P:  Trend PCT and LA  Cont IV abx with Zithro/Vanc/Zosyn  Check cxr in am  Follow cx data  Cont Tamiflu  Follow Viral panel .   ENDOCRINE A:   DM  P:   Hold metformin  SSI   NEUROLOGIC A:   Acute Encephalopathy secondary to illness   P:   RASS goal: -1 to 0  Sedation protocol    FAMILY  - Updates: Wife updated by Dr. Halford Chessman  3/11  - Inter-disciplinary family meet or Palliative Care meeting due by:  03/06/16    Rexene Edison NP-C  [facility] [place] Pager: [phone]  02/29/2016, 10:34 AM

## 2016-02-29 NOTE — Progress Notes (Signed)
  Echocardiogram 2D Echocardiogram has been performed.  Anthony Holder 02/29/2016, 1:19 PM

## 2016-03-01 ENCOUNTER — Inpatient Hospital Stay (HOSPITAL_COMMUNITY): Payer: Medicare Other

## 2016-03-01 DIAGNOSIS — I509 Heart failure, unspecified: Secondary | ICD-10-CM | POA: Insufficient documentation

## 2016-03-01 DIAGNOSIS — N189 Chronic kidney disease, unspecified: Secondary | ICD-10-CM

## 2016-03-01 DIAGNOSIS — N179 Acute kidney failure, unspecified: Secondary | ICD-10-CM

## 2016-03-01 DIAGNOSIS — I350 Nonrheumatic aortic (valve) stenosis: Secondary | ICD-10-CM

## 2016-03-01 LAB — POCT I-STAT 3, ART BLOOD GAS (G3+)
Acid-base deficit: 5 mmol/L — ABNORMAL HIGH (ref 0.0–2.0)
BICARBONATE: 18.7 meq/L — AB (ref 20.0–24.0)
O2 Saturation: 96 %
PCO2 ART: 29.6 mmHg — AB (ref 35.0–45.0)
PH ART: 7.41 (ref 7.350–7.450)
PO2 ART: 76 mmHg — AB (ref 80.0–100.0)
Patient temperature: 98.6
TCO2: 20 mmol/L (ref 0–100)

## 2016-03-01 LAB — LACTIC ACID, PLASMA: LACTIC ACID, VENOUS: 1.6 mmol/L (ref 0.5–2.0)

## 2016-03-01 LAB — COMPREHENSIVE METABOLIC PANEL
ALT: 1269 U/L — AB (ref 17–63)
AST: 883 U/L — ABNORMAL HIGH (ref 15–41)
Albumin: 2.2 g/dL — ABNORMAL LOW (ref 3.5–5.0)
Alkaline Phosphatase: 92 U/L (ref 38–126)
Anion gap: 11 (ref 5–15)
BILIRUBIN TOTAL: 1.5 mg/dL — AB (ref 0.3–1.2)
BUN: 44 mg/dL — ABNORMAL HIGH (ref 6–20)
CALCIUM: 7.4 mg/dL — AB (ref 8.9–10.3)
CHLORIDE: 107 mmol/L (ref 101–111)
CO2: 20 mmol/L — ABNORMAL LOW (ref 22–32)
CREATININE: 1.98 mg/dL — AB (ref 0.61–1.24)
GFR, EST AFRICAN AMERICAN: 37 mL/min — AB (ref >60)
GFR, EST NON AFRICAN AMERICAN: 32 mL/min — AB (ref >60)
Glucose, Bld: 295 mg/dL — ABNORMAL HIGH (ref 65–99)
Potassium: 3.8 mmol/L (ref 3.5–5.1)
Sodium: 138 mmol/L (ref 135–145)
TOTAL PROTEIN: 5 g/dL — AB (ref 6.5–8.1)

## 2016-03-01 LAB — GLUCOSE, CAPILLARY
GLUCOSE-CAPILLARY: 264 mg/dL — AB (ref 65–99)
Glucose-Capillary: 238 mg/dL — ABNORMAL HIGH (ref 65–99)
Glucose-Capillary: 252 mg/dL — ABNORMAL HIGH (ref 65–99)
Glucose-Capillary: 265 mg/dL — ABNORMAL HIGH (ref 65–99)
Glucose-Capillary: 275 mg/dL — ABNORMAL HIGH (ref 65–99)
Glucose-Capillary: 280 mg/dL — ABNORMAL HIGH (ref 65–99)

## 2016-03-01 LAB — PROTIME-INR
INR: 2.69 — AB (ref 0.00–1.49)
PROTHROMBIN TIME: 28.2 s — AB (ref 11.6–15.2)

## 2016-03-01 LAB — CBC
HEMATOCRIT: 35.1 % — AB (ref 39.0–52.0)
Hemoglobin: 11.1 g/dL — ABNORMAL LOW (ref 13.0–17.0)
MCH: 27.1 pg (ref 26.0–34.0)
MCHC: 31.6 g/dL (ref 30.0–36.0)
MCV: 85.6 fL (ref 78.0–100.0)
Platelets: 123 10*3/uL — ABNORMAL LOW (ref 150–400)
RBC: 4.1 MIL/uL — ABNORMAL LOW (ref 4.22–5.81)
RDW: 16.5 % — AB (ref 11.5–15.5)
WBC: 12.4 10*3/uL — ABNORMAL HIGH (ref 4.0–10.5)

## 2016-03-01 LAB — HEPATITIS A ANTIBODY, IGM: Hep A IgM: NEGATIVE

## 2016-03-01 LAB — PROCALCITONIN: PROCALCITONIN: 3.01 ng/mL

## 2016-03-01 LAB — HEPATITIS B CORE ANTIBODY, IGM: Hep B C IgM: NEGATIVE

## 2016-03-01 LAB — MAGNESIUM: Magnesium: 2.1 mg/dL (ref 1.7–2.4)

## 2016-03-01 LAB — HEPATITIS B SURFACE ANTIBODY, QUANTITATIVE

## 2016-03-01 LAB — HEPATITIS A ANTIBODY, TOTAL: HEP A TOTAL AB: POSITIVE — AB

## 2016-03-01 LAB — PHOSPHORUS: PHOSPHORUS: 2.5 mg/dL (ref 2.5–4.6)

## 2016-03-01 LAB — HEPATITIS B CORE ANTIBODY, TOTAL: Hep B Core Total Ab: NEGATIVE

## 2016-03-01 LAB — HEPATITIS B SURFACE ANTIGEN: HEP B S AG: NEGATIVE

## 2016-03-01 LAB — CK: CK TOTAL: 168 U/L (ref 49–397)

## 2016-03-01 LAB — HEPATITIS C ANTIBODY: HCV Ab: 0.1 s/co ratio (ref 0.0–0.9)

## 2016-03-01 MED ORDER — FUROSEMIDE 10 MG/ML IJ SOLN
60.0000 mg | Freq: Three times a day (TID) | INTRAMUSCULAR | Status: DC
  Administered 2016-03-01 – 2016-03-02 (×4): 60 mg via INTRAVENOUS
  Filled 2016-03-01 (×4): qty 6

## 2016-03-01 MED ORDER — CHLORHEXIDINE GLUCONATE 0.12 % MT SOLN
15.0000 mL | Freq: Two times a day (BID) | OROMUCOSAL | Status: DC
  Administered 2016-03-02: 15 mL via OROMUCOSAL
  Filled 2016-03-01: qty 15

## 2016-03-01 MED ORDER — INSULIN ASPART 100 UNIT/ML ~~LOC~~ SOLN
0.0000 [IU] | SUBCUTANEOUS | Status: DC
  Administered 2016-03-01: 11 [IU] via SUBCUTANEOUS
  Administered 2016-03-02: 15 [IU] via SUBCUTANEOUS
  Administered 2016-03-02: 7 [IU] via SUBCUTANEOUS
  Administered 2016-03-02 (×2): 4 [IU] via SUBCUTANEOUS
  Administered 2016-03-03: 15 [IU] via SUBCUTANEOUS
  Administered 2016-03-03: 4 [IU] via SUBCUTANEOUS
  Administered 2016-03-03: 16 [IU] via SUBCUTANEOUS
  Administered 2016-03-03 (×2): 7 [IU] via SUBCUTANEOUS
  Administered 2016-03-04: 11 [IU] via SUBCUTANEOUS
  Administered 2016-03-04: 7 [IU] via SUBCUTANEOUS
  Administered 2016-03-04: 11 [IU] via SUBCUTANEOUS
  Administered 2016-03-04: 4 [IU] via SUBCUTANEOUS
  Administered 2016-03-04 (×2): 7 [IU] via SUBCUTANEOUS
  Administered 2016-03-05: 3 [IU] via SUBCUTANEOUS
  Administered 2016-03-05: 4 [IU] via SUBCUTANEOUS
  Administered 2016-03-05: 3 [IU] via SUBCUTANEOUS

## 2016-03-01 MED ORDER — POTASSIUM CHLORIDE 10 MEQ/50ML IV SOLN
10.0000 meq | INTRAVENOUS | Status: AC
  Administered 2016-03-01 (×2): 10 meq via INTRAVENOUS
  Filled 2016-03-01 (×2): qty 50

## 2016-03-01 MED ORDER — CETYLPYRIDINIUM CHLORIDE 0.05 % MT LIQD
7.0000 mL | Freq: Two times a day (BID) | OROMUCOSAL | Status: DC
  Administered 2016-03-02 – 2016-03-05 (×7): 7 mL via OROMUCOSAL

## 2016-03-01 MED ORDER — PANTOPRAZOLE SODIUM 40 MG PO TBEC
40.0000 mg | DELAYED_RELEASE_TABLET | Freq: Every day | ORAL | Status: DC
  Administered 2016-03-01 – 2016-03-04 (×4): 40 mg via ORAL
  Filled 2016-03-01 (×4): qty 1

## 2016-03-01 NOTE — Care Management Note (Signed)
Case Management Note  Patient Details  Name: Anthony Holder MRN: [id number] Date of Birth: 1941/10/01  Subjective/Objective:     Pt admitted with acute encephalopathy and SOB               Action/Plan:  PTA - pt was independent from home with wife, used walker/cane as needed.  CM will continue to monitor for disposition needs   Expected Discharge Date:                  Expected Discharge Plan:  [place]  In-House Referral:     Discharge planning Services  CM Consult  Post Acute Care Choice:    Choice offered to:     DME Arranged:    DME Agency:     HH Arranged:    HH Agency:     Status of Service:  In process, will continue to follow  Medicare Important Message Given:    Date Medicare IM Given:    Medicare IM give by:    Date Additional Medicare IM Given:    Additional Medicare Important Message give by:     If discussed at [place] of Stay Meetings, dates discussed:    Additional Comments:  Maryclare Labrador, RN 03/01/2016, 2:34 PM

## 2016-03-01 NOTE — Procedures (Signed)
Extubation Procedure Note  Patient Details:   Name: Anthony Holder DOB: 08-18-1941 MRN: [id number]   Airway Documentation:     Evaluation  O2 sats: Patient self extubated. Complications: No apparent complications Patient did tolerate procedure well. Bilateral Breath Sounds: Clear, Diminished Suctioning: Airway Yes   RN went in patients room to find that he self extubated. RN called RT; upon RT arrival patient appeared stable and was able to tell me his name and DOB. RT will continue to monitor.  Tiburcio Bash 03/01/2016, 7:39 AM

## 2016-03-01 NOTE — Consult Note (Signed)
PULMONARY / CRITICAL CARE MEDICINE   Name: Anthony Holder MRN: [id number] DOB: September 03, 1941    ADMISSION DATE:  02/27/2016 CONSULTATION DATE:  02/28/16  REFERRING MD:  [facility], Dr. Grandville Silos  CHIEF COMPLAINT:  Resp distress   HISTORY OF PRESENT ILLNESS:   75 yo male with CAD, D CHF , DM and aortic stenosis admitted with 1 week of weakness, dyspnea , congestion, decreased appetite . Felt to have a possible viral infection. Started on empiric azithro and rocephin. CXR with no acute process. On 3/11 pt with resp distress, lethargy and hypotenison c/w sepsis . Pt continued to decompensated with increased wob, tachycardia, and resp distress . PCCM consulted with transfer pt to ICU with intubation and CVL placement. Influenza swab neb . However second swab done was reported positive . He was placed on Tamiflu . IV abx were expanded to Zosyn and Vanc. B/p continued to decline requiring pressor support . Started on stress dose steroids .   SUBJECTIVE:  Se;f extubated  VITAL SIGNS: BP 107/92 mmHg  Pulse 67  Temp(Src) 97.2 F (36.2 C) (Axillary)  Resp 24  Ht 5\' 10"  (1.778 m)  Wt 102 kg (224 lb 13.9 oz)  BMI 32.27 kg/m2  SpO2 90%  HEMODYNAMICS: CVP:  [8 mmHg-15 mmHg] 8 mmHg  VENTILATOR SETTINGS: Vent Mode:  [-] PRVC FiO2 (%):  [40 %-100 %] 40 % Set Rate:  [22 bmp] 22 bmp Vt Set:  [580 mL] 580 mL PEEP:  [5 cmH20] 5 cmH20 Pressure Support:  [8 cmH20] 8 cmH20 Plateau Pressure:  [24 cmH20-26 cmH20] 26 cmH20  INTAKE / OUTPUT: I/O last 3 completed shifts: In: 6384.2 [I.V.:3914.2; NG/GT:520; IV [id number] Out: [id number] [Urine:1575]  PHYSICAL EXAMINATION: General: critically ill, just self extubated Neuro: More alert , FC HEENT:  ETT  Cardiovascular:  RRR, no m/r/g  Lungs:  Coarse Abdomen:  Soft, hypoactive , no r/g Musculoskeletal:  S/p left BKA  Skin:  Intact   LABS:  BMET  Recent Labs Lab 02/28/16 0404 02/29/16 0505 03/01/16 0325  NA 135 136 138  K 4.4 4.8 3.8  CL 102 104  107  CO2 20* 19* 20*  BUN 35* 47* 44*  CREATININE 2.13* 2.59* 1.98*  GLUCOSE 286* 237* 295*    Electrolytes  Recent Labs Lab 02/28/16 0404 02/28/16 1444 02/29/16 0505 03/01/16 0325  CALCIUM 8.6*  --  7.4* 7.4*  MG  --  2.2 1.9 2.1  PHOS  --   --   --  2.5    CBC  Recent Labs Lab 02/28/16 0404 02/29/16 0505 03/01/16 0325  WBC 20.1* 12.8* 12.4*  HGB 11.7* 10.6* 11.1*  HCT 37.7* 34.2* 35.1*  PLT 211 98* 123*    Coag's  Recent Labs Lab 02/28/16 1845 03/01/16 0325  APTT 46*  --   INR 3.44* 2.69*    Sepsis Markers  Recent Labs Lab 02/28/16 1444  02/28/16 1845 02/29/16 0505 03/01/16 0325  LATICACIDVEN  --   < > 5.3* 2.3* 1.6  PROCALCITON 2.68  --   --  3.96 3.01  < > = values in this interval not displayed.  ABG  Recent Labs Lab 02/28/16 1530 02/28/16 2000 03/01/16 0921  PHART 7.310* 7.360 7.410  PCO2ART 20.6* 23.6* 29.6*  PO2ART 108* 205* 76.0*    Liver Enzymes  Recent Labs Lab 02/28/16 1845 02/29/16 0505 03/01/16 0325  AST 1563* 3759* 883*  ALT 1143* 1930* 1269*  ALKPHOS 127* 109 92  BILITOT 2.5* 2.0* 1.5*  ALBUMIN 2.7* 2.5* 2.2*  Cardiac Enzymes  Recent Labs Lab 02/28/16 1008 02/28/16 1444 02/28/16 1845  TROPONINI 0.18* 0.19* 0.33*    Glucose  Recent Labs Lab 02/29/16 1609 02/29/16 1933 03/01/16 0037 03/01/16 0328 03/01/16 0858 03/01/16 1306  GLUCAP 254* 257* 265* 264* 238* 252*    Imaging Dg Chest Port 1 View  03/01/2016  CLINICAL DATA:  Respiratory failure. EXAM: PORTABLE CHEST 1 VIEW COMPARISON:  02/28/2016, 02/27/2016 and 04/17/2015 FINDINGS: Endotracheal tube, NG tube and central line appear in good position. Aortic atherosclerosis. CABG. New right pleural effusion with new bibasilar atelectasis, minimal on the left. Pulmonary vascularity is normal. Bones appear normal. IMPRESSION: New right pleural effusion with new bibasilar atelectasis. Electronically Signed   By: Lorriane Shire M.D.   On: 03/01/2016 07:46      STUDIES:  Echo 3/10 >>25-30%, mod-severe AS, PA 54   CULTURES: 3/11 BC x 2 >> RVP 3/10 >>   ANTIBIOTICS: 3/10 Rocephin >3/11  3/11 Vanc >>3/13 3/11 Zosyn >>>3/13 3/11 Zithro>>>  SIGNIFICANT EVENTS: 3/11 resp distress trans to ICU  3/13- extubated self  LINES/TUBES: 3/11 ETT >>>self 3/13 3/11 R IJ CVL >>  DISCUSSION: 75 yo male admitted with 1 week of weakness /cough ? Influenza with progressive resp decompensation and hypotension c/w septic shock requiring intubation and pressor support.   ASSESSMENT / PLAN:  PULMONARY A: Acute Hypoxic Resp Failure  Low suspicion infection pulm edema and cardia concerns higher P:   May need BIPAP Get abg Neg balance goals needed pcxr in am   CARDIOVASCULAR A:  Septic Shock >>>NO Elevated troponins  CAD  Cardiogenic shock cause likely, AS severe NO rel AI P:  Echo reviewed Dc all roids Cards eval , valve? Lasix  Bb when able   RENAL A:   Acute Renal Failure Cardio- renal P:   Ensure kvo Lasix 60 iv q8h k supp mild  GASTROINTESTINAL A:   Elevated Ammonia  Transamnitis ? Etiology Possible shock liver vs occult etiology - from cardiogenic shock likely Complains of dysphagia P:   ppi Get slp LFt follow up  HEMATOLOGIC A:   Anemia  Coagulopathy - shock, liver congestion  P:  coags in am  SCD   INFECTIOUS A:   Sepsis not buying this fully R/o PNA (elevated PCT 2>3.96, but had ARF crt elevation P:   Dc zosyn Dc vanc Add stop date atypical coverage, 15th  ENDOCRINE A:   DM  P:   Hold metformin  SSI  lantus likely will be needed  NEUROLOGIC A:   Acute Encephalopathy secondary to illness   P:   RASS goal: 0 Sedation protocol off with self extubation   FAMILY  - Updates: Wife updated by Dr. Halford Chessman  3/11  - Inter-disciplinary family meet or Palliative Care meeting due by:  03/06/16   Ccm time 30 min  Lavon Paganini. Titus Mould, MD, [place] Pgr: [facility]
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
## 2016-03-01 NOTE — Progress Notes (Signed)
eLink Physician-Brief Progress Note Patient Name: Anthony Holder DOB: 11/23/1941 MRN: [id number]   Date of Service  03/01/2016  HPI/Events of Note  Blood glucose = 280.   eICU Interventions  Will increase Q 4 hour Novolog SSI to resistant scale.         Sommer,Steven Cornelia Copa 03/01/2016, 9:12 PM

## 2016-03-01 NOTE — Progress Notes (Signed)
Results for DUJUAN, AGRELLA (MRN [id number]) as of 03/01/2016 10:44  Ref. Range 02/29/2016 16:09 02/29/2016 19:33 03/01/2016 00:37 03/01/2016 03:28 03/01/2016 08:58  Glucose-Capillary Latest Ref Range: 65-99 mg/dL 254 (H) 257 (H) 265 (H) 264 (H) 238 (H)  Noted that blood sugars continue to be greater than 180 mg/dl. Patient takes Novolin N 30 units at breakfast and 15 units at supper at home. Recommend increasing Novolog to RESISTANT every 4 hours.  May need low dose basal insulin if CBGs continue to be elevated. Will continue to monitor blood sugars while in the hospital. Harvel Ricks RN [name] CDE

## 2016-03-01 NOTE — Progress Notes (Signed)
Pt found self-extubated, placed on NRB at 15L. Pt alert to self, place, and knows birthdate. [place] notified. Pt seems stable at this time. Sats 100%. Will continue to monitor patient at this time.

## 2016-03-01 NOTE — Progress Notes (Signed)
Patient's blood sugar @ 2045 was 280. Patient given sliding scale coverage and eLink MD notified as patient's blood sugar has been elevated all day. Saw note from diabetes coordinator recommending resistant scale and adding on a longer acting insulin as well. Somer, MD increased scale to resistant. Will continue to monitor blood sugars and treat per orders.

## 2016-03-02 DIAGNOSIS — R1010 Upper abdominal pain, unspecified: Secondary | ICD-10-CM

## 2016-03-02 DIAGNOSIS — I5023 Acute on chronic systolic (congestive) heart failure: Secondary | ICD-10-CM

## 2016-03-02 LAB — BASIC METABOLIC PANEL
ANION GAP: 11 (ref 5–15)
ANION GAP: 13 (ref 5–15)
BUN: 35 mg/dL — ABNORMAL HIGH (ref 6–20)
BUN: 30 mg/dL — ABNORMAL HIGH (ref 6–20)
CALCIUM: 8.1 mg/dL — AB (ref 8.9–10.3)
CO2: 27 mmol/L (ref 22–32)
CO2: 27 mmol/L (ref 22–32)
Calcium: 7.9 mg/dL — ABNORMAL LOW (ref 8.9–10.3)
Chloride: 107 mmol/L (ref 101–111)
Chloride: 105 mmol/L (ref 101–111)
Creatinine, Ser: 1.49 mg/dL — ABNORMAL HIGH (ref 0.61–1.24)
Creatinine, Ser: 1.48 mg/dL — ABNORMAL HIGH (ref 0.61–1.24)
GFR calc Af Amer: 52 mL/min — ABNORMAL LOW (ref >60)
GFR calc Af Amer: 52 mL/min — ABNORMAL LOW (ref >60)
GFR calc non Af Amer: 45 mL/min — ABNORMAL LOW (ref >60)
GFR, EST NON AFRICAN AMERICAN: 45 mL/min — AB (ref >60)
GLUCOSE: 240 mg/dL — AB (ref 65–99)
GLUCOSE: 219 mg/dL — AB (ref 65–99)
POTASSIUM: 2.6 mmol/L — AB (ref 3.5–5.1)
Potassium: 3.3 mmol/L — ABNORMAL LOW (ref 3.5–5.1)
Sodium: 145 mmol/L (ref 135–145)
Sodium: 145 mmol/L (ref 135–145)

## 2016-03-02 LAB — RESPIRATORY VIRUS PANEL
Adenovirus: NEGATIVE
INFLUENZA B 1: NEGATIVE
Influenza A: NEGATIVE
METAPNEUMOVIRUS: NEGATIVE
PARAINFLUENZA 1 A: NEGATIVE
PARAINFLUENZA 2 A: NEGATIVE
PARAINFLUENZA 3 A: NEGATIVE
RESPIRATORY SYNCYTIAL VIRUS A: NEGATIVE
Respiratory Syncytial Virus B: NEGATIVE
Rhinovirus: POSITIVE — AB

## 2016-03-02 LAB — GLUCOSE, CAPILLARY
GLUCOSE-CAPILLARY: 274 mg/dL — AB (ref 65–99)
Glucose-Capillary: 231 mg/dL — ABNORMAL HIGH (ref 65–99)
Glucose-Capillary: 192 mg/dL — ABNORMAL HIGH (ref 65–99)
Glucose-Capillary: 185 mg/dL — ABNORMAL HIGH (ref 65–99)
Glucose-Capillary: 407 mg/dL — ABNORMAL HIGH (ref 65–99)
Glucose-Capillary: 338 mg/dL — ABNORMAL HIGH (ref 65–99)

## 2016-03-02 LAB — HEMOGLOBIN AND HEMATOCRIT, BLOOD
HEMATOCRIT: 39.4 % (ref 39.0–52.0)
HEMOGLOBIN: 12.5 g/dL — AB (ref 13.0–17.0)

## 2016-03-02 LAB — PROTIME-INR
INR: 2.05 — AB (ref 0.00–1.49)
PROTHROMBIN TIME: 23 s — AB (ref 11.6–15.2)

## 2016-03-02 LAB — FIBRINOGEN: FIBRINOGEN: 351 mg/dL (ref 204–475)

## 2016-03-02 LAB — PHOSPHORUS: Phosphorus: 1.6 mg/dL — ABNORMAL LOW (ref 2.5–4.6)

## 2016-03-02 LAB — MAGNESIUM: Magnesium: 1.8 mg/dL (ref 1.7–2.4)

## 2016-03-02 LAB — PLATELET COUNT: PLATELETS: 171 10*3/uL (ref 150–400)

## 2016-03-02 LAB — APTT: aPTT: 39 seconds — ABNORMAL HIGH (ref 24–37)

## 2016-03-02 MED ORDER — INSULIN ASPART 100 UNIT/ML ~~LOC~~ SOLN
6.0000 [IU] | Freq: Once | SUBCUTANEOUS | Status: AC
  Administered 2016-03-02: 6 [IU] via INTRAVENOUS

## 2016-03-02 MED ORDER — CETYLPYRIDINIUM CHLORIDE 0.05 % MT LIQD: 7.0000 mL | Freq: Two times a day (BID) | OROMUCOSAL | Status: DC

## 2016-03-02 MED ORDER — INSULIN ASPART 100 UNIT/ML IV SOLN
12.0000 [IU] | Freq: Once | INTRAVENOUS | Status: AC
  Administered 2016-03-02: 12 [IU] via INTRAVENOUS

## 2016-03-02 MED ORDER — INSULIN ASPART 100 UNIT/ML IV SOLN: 16.0000 [IU] | Freq: Once | INTRAVENOUS | Status: DC

## 2016-03-02 MED ORDER — POTASSIUM CHLORIDE 10 MEQ/50ML IV SOLN
10.0000 meq | INTRAVENOUS | Status: AC
  Administered 2016-03-02 (×4): 10 meq via INTRAVENOUS
  Filled 2016-03-02 (×4): qty 50

## 2016-03-02 MED ORDER — POTASSIUM PHOSPHATES 15 MMOLE/5ML IV SOLN
30.0000 mmol | Freq: Once | INTRAVENOUS | Status: AC
  Administered 2016-03-02: 30 mmol via INTRAVENOUS
  Filled 2016-03-02: qty 10

## 2016-03-02 MED ORDER — POTASSIUM CHLORIDE CRYS ER 20 MEQ PO TBCR
60.0000 meq | EXTENDED_RELEASE_TABLET | Freq: Once | ORAL | Status: AC
  Administered 2016-03-02: 60 meq via ORAL
  Filled 2016-03-02: qty 3
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
## 2016-03-02 NOTE — Progress Notes (Addendum)
Spoke with Oletta Darter, MD regarding patient's respiratory status. Patient heard coughing. RN to bedside and noticed patient desatting to 88% and coughing. Patient also tachypnic to high 30s. Water immediately removed from patient's bedside. Nasal cannula increased to 4L from 2L. RT made aware as well and breathing treatment to be administered. Per Oletta Darter, MD told to hold patient's transfer to floor bed for now until patient recovers.

## 2016-03-02 NOTE — Progress Notes (Signed)
Spoke with Oletta Darter, MD regarding pt's blood sugar of 338. MD ok with sugar as it has significantly come down since last check. Sliding scale insulin of 15 units given per order.

## 2016-03-02 NOTE — Progress Notes (Signed)
[facility] Progress Note Patient Name: Cevin Kutz DOB: February 25, 1941 MRN: [id number]   Date of Service  03/02/2016  HPI/Events of Note    eICU Interventions  Hypokalemia, repleted      Intervention Category Intermediate Interventions: Electrolyte abnormality - evaluation and management  Simonne Maffucci 03/02/2016, 5:59 AM

## 2016-03-02 NOTE — Progress Notes (Signed)
eLink Physician-Brief Progress Note Patient Name: Anthony Holder DOB: 29-Jul-1941 MRN: [id number]   Date of Service  03/02/2016  HPI/Events of Note  Bloog glucose = 407 >> 12 units Novolog IV >> 413.  eICU Interventions  Will give another 6 units Novolog IV now and recheck BG in 1 hour.     Intervention Category Major Interventions: Hyperglycemia - active titration of insulin therapy  Lysle Dingwall 03/02/2016, 5:59 PM

## 2016-03-02 NOTE — Progress Notes (Signed)
PULMONARY / CRITICAL CARE MEDICINE   Name: Anthony Holder MRN: [id number] DOB: 1941-06-13    ADMISSION DATE:  02/27/2016 CONSULTATION DATE:  02/28/16  REFERRING MD:  TR[facility], Dr. Grandville Silos  CHIEF COMPLAINT:  Resp distress   SUBJECTIVE:   patient says he feels bloated after his first meal. Resting comfortably .   VITAL SIGNS: BP 125/63 mmHg  Pulse 77  Temp(Src) 97.7 F (36.5 C) (Oral)  Resp 30  Ht 5\' 10"  (1.778 m)  Wt 212 lb 11.9 oz (96.5 kg)  BMI 30.53 kg/m2  SpO2 97%  HEMODYNAMICS: CVP:  [9 mmHg-14 mmHg] 9 mmHg  VENTILATOR SETTINGS:    INTAKE / OUTPUT: I/O last 3 completed shifts: In: 2969 [P.O.:60; I.V.:2089; NG/GT:320; IV Piggyback:500] Out: 8780 [Urine:8780]  PHYSICAL EXAMINATION: General: elderly white male HEENT: Atraumatic , normocephalic, no disharge    Cardiovascular:  S1S2 ,RRR, No MRG noted Lungs:  Clear, no wheezes, crackles,no rales Abdomen:  Firm, hypoactive ,non tender Musculoskeletal:  S/p left BKA  Skin:  Intact   LABS:  BMET  Recent Labs Lab 03/01/16 0325 03/02/16 0440 03/02/16 1230  NA 138 145 145  K 3.8 2.6* 3.3*  CL 107 107 105  CO2 20* 27 27  BUN 44* 35* 30*  CREATININE 1.98* 1.49* 1.48*  GLUCOSE 295* 240* 219*    Electrolytes  Recent Labs Lab 02/29/16 0505 03/01/16 0325 03/02/16 0440 03/02/16 1230  CALCIUM 7.4* 7.4* 7.9* 8.1*  MG 1.9 2.1 1.8  --   PHOS  --  2.5 1.6*  --     CBC  Recent Labs Lab 02/28/16 0404 02/29/16 0505 03/01/16 0325  WBC 20.1* 12.8* 12.4*  HGB 11.7* 10.6* 11.1*  HCT 37.7* 34.2* 35.1*  PLT 211 98* 123*    Coag's  Recent Labs Lab 02/28/16 1845 03/01/16 0325  APTT 46*  --   INR 3.44* 2.69*    Sepsis Markers  Recent Labs Lab 02/28/16 1444  02/28/16 1845 02/29/16 0505 03/01/16 0325  LATICACIDVEN  --   < > 5.3* 2.3* 1.6  PROCALCITON 2.68  --   --  3.96 3.01  < > = values in this interval not displayed.  ABG  Recent Labs Lab 02/28/16 1530 02/28/16 2000 03/01/16 0921   PHART 7.310* 7.360 7.410  PCO2ART 20.6* 23.6* 29.6*  PO2ART 108* 205* 76.0*    Liver Enzymes  Recent Labs Lab 02/28/16 1845 02/29/16 0505 03/01/16 0325  AST 1563* 3759* 883*  ALT 1143* 1930* 1269*  ALKPHOS 127* 109 92  BILITOT 2.5* 2.0* 1.5*  ALBUMIN 2.7* 2.5* 2.2*    Cardiac Enzymes  Recent Labs Lab 02/28/16 1008 02/28/16 1444 02/28/16 1845  TROPONINI 0.18* 0.19* 0.33*    Glucose  Recent Labs Lab 03/01/16 1625 03/01/16 2053 03/01/16 2331 03/02/16 0350 03/02/16 0829 03/02/16 1217  GLUCAP 275* 280* 274* 231* 192* 185*    Imaging No results found.   STUDIES:  Echo 3/10 >>25-30%, mod-severe AS, PA 54   CULTURES: 3/11 BC x 2 >> RVP 3/10 >> positive for Rhinovirus   ANTIBIOTICS: 3/10 Rocephin >3/11  3/11 Vanc >>3/13 3/11 Zosyn >>>3/13 3/11 Zithro>>> 3/14  SIGNIFICANT EVENTS: 3/11 resp distress trans to ICU  3/13- extubated self  LINES/TUBES: 3/11 ETT >>>self 3/13 3/11 R IJ CVL >>  DISCUSSION: 75 yo male admitted with 1 week of weakness /cough ? Influenza with progressive resp decompensation and hypotension c/w septic shock requiring intubation and pressor support.  Self Extubated on 3/13  ASSESSMENT / PLAN:  PULMONARY A: Acute Hypoxic  Resp Failure  Low suspicion infection pulm edema and cardia concerns higher P:   May need BIPAP Neg for 6.5 L Hold lasix now Follow up CXR in am   CARDIOVASCULAR A:  Septic Shock >>>NO CAD  Cardiogenic shock cause likely, AS severe  P:  Echo reviewed Cards following   RENAL A:   Acute Renal Failure Cardio- renal P:   Ensure kvo Replace elecrolytes Hold lasix , Maintaining 6.5l Negative balance    GASTROINTESTINAL A:   Elevated Ammonia  Transamnitis ? Etiology Possible shock liver vs occult etiology - from cardiogenic shock likely Complains of dysphagia P:   Continue protonix for GI   follow Diet as recommended by speech therapist Trend ALT,AST in am  HEMATOLOGIC A:    Anemia  Coagulopathy - shock, liver congestion  P:  SCD  Trend coag in am  INFECTIOUS A:   Sepsis ? (elevated PCT 2>3.96, but had ARF crt elevation P:   Dc zosyn Dc vanc Dc Azithromycin  ENDOCRINE A:   DM  P:   Continue to hold metformin  continue SSI  lantus likely will be needed  NEUROLOGIC A:   Acute Encephalopathy secondary to illness -Resolved  P:   RASS goal: 0   Patient remains extubated and is doing well on 2l of O2 . Doesn't require ICU level of care. Will be followed on telemetry floor.    Bincy Varughese,AG-ACNP Pulmonary & Critical Care   STAFF NOTE: I, Merrie Roof, MD FACP have personally reviewed patient's available data, including medical history, events of note, physical examination and test results as part of my evaluation. I have discussed with resident/NP and other care providers such as pharmacist, RN and RRT. In addition, I personally evaluated patient and elicited key findings [id number], no distress, less coarse BS, neg over 6 liters, appreciate cards assessment, mobilize, reduce lasix as will be preload sensitive, k , pho supp, to triad, remain on tele  Lavon Paganini. Titus Mould, MD, [place] Pgr: [place] [facility]monary & Critical Care 03/02/2016 3:24 PM

## 2016-03-02 NOTE — Progress Notes (Signed)
CRITICAL VALUE ALERT  Critical value received: K+2.6  Date of notification:  03/02/16  Time of notification:  [id number]  Critical value read back:Yes.    Nurse who received alert:  Lysle Rubens RN  MD notified (1st page):  eLink MD  Time of first page:  [phone]  Responding MD:  Lake Bells, MD  Time MD responded:  [phone]

## 2016-03-02 NOTE — Progress Notes (Signed)
Inpatient Diabetes Program Recommendations  AACE/ADA: New Consensus Statement on Inpatient Glycemic Control (2015)  Target Ranges:  Prepandial:   less than 140 mg/dL      Peak postprandial:   less than 180 mg/dL (1-2 hours)      Critically ill patients:  140 - 180 mg/dL   Results for Anthony Holder, Anthony Holder (MRN [id number]) as of 03/02/2016 10:12  Ref. Range 03/01/2016 00:37 03/01/2016 03:28 03/01/2016 08:58 03/01/2016 13:06 03/01/2016 16:25 03/01/2016 20:53  Glucose-Capillary Latest Ref Range: 65-99 mg/dL 265 (H) 264 (H) 238 (H) 252 (H) 275 (H) 280 (H)   Results for Anthony Holder, Anthony Holder (MRN [id number]) as of 03/02/2016 10:12  Ref. Range 03/01/2016 23:31 03/02/2016 03:50 03/02/2016 08:29  Glucose-Capillary Latest Ref Range: 65-99 mg/dL 274 (H) 231 (H) 192 (H)    Home DM Meds: NPH insulin- 30 units AM/ 15 units PM       Metformin 500 mg QID  Current Insulin Orders: Novolog Resistant Correction Scale/ SSI (0-20 units) Q4 hours     MD- Note last dose IV Solucortef given yesterday at 9am.  No more steroids ordered at this time.  Having consistently elevated glucose levels.  Note patient self-extubated yesterday but remains NPO at this time.  Please consider starting 50% of patient's home dose of NPH insulin- NPH 15 units AM/ NPH 8 units QHS    --Will follow patient during hospitalization--  Wyn Quaker RN, MSN, CDE Diabetes Coordinator Inpatient Glycemic Control Team Team Pager: [phone] (8a-5p)

## 2016-03-03 ENCOUNTER — Inpatient Hospital Stay (HOSPITAL_COMMUNITY): Payer: Medicare Other

## 2016-03-03 LAB — GLUCOSE, CAPILLARY
GLUCOSE-CAPILLARY: 238 mg/dL — AB (ref 65–99)
GLUCOSE-CAPILLARY: 318 mg/dL — AB (ref 65–99)
GLUCOSE-CAPILLARY: 412 mg/dL — AB (ref 65–99)
GLUCOSE-CAPILLARY: 198 mg/dL — AB (ref 65–99)
Glucose-Capillary: 115 mg/dL — ABNORMAL HIGH (ref 65–99)
Glucose-Capillary: 246 mg/dL — ABNORMAL HIGH (ref 65–99)

## 2016-03-03 LAB — HEPATIC FUNCTION PANEL
ALBUMIN: 2.6 g/dL — AB (ref 3.5–5.0)
ALT: 565 U/L — ABNORMAL HIGH (ref 17–63)
AST: 193 U/L — ABNORMAL HIGH (ref 15–41)
Alkaline Phosphatase: 103 U/L (ref 38–126)
Bilirubin, Direct: 0.5 mg/dL (ref 0.1–0.5)
Indirect Bilirubin: 0.9 mg/dL (ref 0.3–0.9)
TOTAL PROTEIN: 5.7 g/dL — AB (ref 6.5–8.1)
Total Bilirubin: 1.4 mg/dL — ABNORMAL HIGH (ref 0.3–1.2)

## 2016-03-03 LAB — BASIC METABOLIC PANEL
Anion gap: 10 (ref 5–15)
BUN: 22 mg/dL — AB (ref 6–20)
CALCIUM: 8 mg/dL — AB (ref 8.9–10.3)
CO2: 26 mmol/L (ref 22–32)
CREATININE: 1.2 mg/dL (ref 0.61–1.24)
Chloride: 107 mmol/L (ref 101–111)
GFR calc Af Amer: >60 mL/min (ref >60)
GFR, EST NON AFRICAN AMERICAN: 58 mL/min — AB (ref >60)
GLUCOSE: 247 mg/dL — AB (ref 65–99)
POTASSIUM: 3.3 mmol/L — AB (ref 3.5–5.1)
Sodium: 143 mmol/L (ref 135–145)

## 2016-03-03 LAB — AMMONIA: AMMONIA: 50 umol/L — AB (ref 9–35)

## 2016-03-03 MED ORDER — POTASSIUM CHLORIDE CRYS ER 20 MEQ PO TBCR
40.0000 meq | EXTENDED_RELEASE_TABLET | Freq: Once | ORAL | Status: AC
  Administered 2016-03-03: 40 meq via ORAL
  Filled 2016-03-03: qty 2

## 2016-03-03 MED ORDER — INSULIN GLARGINE 100 UNIT/ML ~~LOC~~ SOLN
15.0000 [IU] | Freq: Every day | SUBCUTANEOUS | Status: DC
  Administered 2016-03-03 – 2016-03-04 (×2): 15 [IU] via SUBCUTANEOUS
  Filled 2016-03-03 (×3): qty 0.15

## 2016-03-03 NOTE — Progress Notes (Signed)
Inpatient Diabetes Program Recommendations  AACE/ADA: New Consensus Statement on Inpatient Glycemic Control (2015)  Target Ranges:  Prepandial:   less than 140 mg/dL      Peak postprandial:   less than 180 mg/dL (1-2 hours)      Critically ill patients:  140 - 180 mg/dL   Review of Glycemic Control  Inpatient Diabetes Program Recommendations:  Insulin - Basal: add Lantus 18 units   Change Novolog to TID + HS scale per Glycemic Control order-set Thank you  Raoul Pitch [name], RN,CDE Inpatient Diabetes Coordinator [phone] (team pager)

## 2016-03-03 NOTE — Progress Notes (Signed)
Speech Language Pathology Treatment: Dysphagia  Patient Details Name: Anthony Holder MRN: [id number] DOB: [birth year] Today's Date: 03/03/2016 Time: 2376-2831 SLP Time Calculation (min) (ACUTE ONLY): 12 min  Assessment / Plan / Recommendation Clinical Impression  Skilled treatment session focused on addressing dysphagia goals and eduction.  Upon SLP arrival patient re-stated his compensatory strategies that he has been utilizing since his ACDF surgery (previous admission).  Patient stated that he was informed that his symptoms would resolve with time and since they had not he was concerned.  SLP provided education and reassurance that despite symptoms not resolving he appears be be tolerating his current diet well (per lung sounds, temp, chest x-ray) with the use of his previously mentioned strategies.  SLP informed patient that other patients also report ongoing swallow difficulty with course, dry foods after that surgery.   Patient was provided with regular textures trials and thin liquid via cup which he consumed with increased time to allow for mastication of dry solids and no overt s/s of aspiration.  Given that patient is independently able to state and utilize strategies after set-up of PO no further skilled SLP services are warranted at this time.  SLP will sign off.      HPI HPI: 75 year old male admitted 02/27/16 due to weakness, encephalopathy, sepsis. PMH significant for CVA, COPD, ACDF C3-5 (06/19/15) with subsequent difficulty with dry, particulate solids. No overt s/s aspiration with any consistency tested, including graham cracker mixed with applesauce. Pt reports solids and particulates are managed well with compensatory strategies of small bites, chewing thoroughly, and moistening dry, dense, or particulate solids. (crackers, bread, rice). Recommend soft diet with chopped meats and thin liquids, with additional gravy/sauce. Pt aware of consistencies he must avoid, and reports no diffculty  as long as precautiions are taken.       SLP Plan  All goals met     Recommendations  Diet recommendations: Dysphagia 3 (mechanical soft);Thin liquid Liquids provided via: Cup;Straw Medication Administration: Whole meds with liquid Compensations: Minimize environmental distractions;Slow rate;Small sips/bites;Follow solids with liquid Postural Changes and/or Swallow Maneuvers: Seated upright 90 degrees             Oral Care Recommendations: Oral care BID Follow up Recommendations: None Plan: All goals met     GO               Carmelia Roller., CCC-SLP [phone]  Nathania Waldman 03/03/2016, 10:09 AM

## 2016-03-03 NOTE — Progress Notes (Signed)
[facility] PROGRESS NOTE  Anthony Holder [id number] DOB: [birth year] DOA: 02/27/2016 PCP: [name], MD  Assessment/Plan: 75 yo male admitted with 1 week of weakness /cough ? Influenza with progressive resp decompensation and hypotension c/w septic shock requiring intubation and pressor support. Self Extubated on 3/13  1-Acute Hypoxic Resp Failure  Might be related to acute viral illness vs HF  Lasix on hold.  Repeated chest x ray: improved aeration. Small left pleural effusion.  Negative 3 L.  Defer to cardio resumption of lasix.   Cardiogenic shock ,  AS severe Resolved. BP now elevated.  ECHO; Ef 25 to 30 %, moderate to severe Aortic valve stenosis.  Antibiotics discontinue by CCM. Follow WBC trend,   Weakness, deconditioning;  Related to acute illness.   Acute Renal Failure; thought to be related cardio renal.  Improved.  Repeat phosphorus level in am/   Transamnitis ? Etiology Possible shock liver vs occult etiology - from cardiogenic shock likely Positive hepatitis A antibody.  IgM negative.  [place] mild fatty infiltration of liver.  Elevated Ammonia   Dysphagia; dysphagia 3 diet   DM; will start lantus. Continue with SSI.   Acute Encephalopathy secondary to illness -Resolved   Code Status: Full Code.  Family Communication: care discussed with patient  Disposition Plan: Remain inpatient    Consultants:  Cardiology   Procedures: SIGNIFICANT EVENTS: 3/11 resp distress trans to ICU  3/13- extubated self  LINES/TUBES: 3/11 ETT >>>self 3/13 3/11 R IJ CVL >>  Antibiotics:  Discontinue by CCM   HPI/Subjective: He denies pain. Denies worsening dyspnea.  Feels weak.   Objective: Filed Vitals:   03/02/16 2307 03/03/16 0349  BP: 151/76 152/83  Pulse: 91 88  Temp: 98.2 F (36.8 C) 97.7 F (36.5 C)  Resp:      Intake/Output Summary (Last 24 hours) at 03/03/16 0930 Last data filed at 03/03/16 0407  Gross per 24 hour  Intake     350 ml  Output   4326 ml  Net  -3976 ml   Filed Weights   03/02/16 0239 03/02/16 2302 03/03/16 0407  Weight: 96.5 kg (212 lb 11.9 oz) 93.441 kg (206 lb) 93.033 kg (205 lb 1.6 oz)    Exam:   General:  Sleepy, wakeup answer some questions.   Cardiovascular: S 1, S 2 RRR, systolic murmur  Respiratory: no wheezing, no crackles.   Abdomen: Bs present, soft, nt  Musculoskeletal: trace edema  Data Reviewed: Basic Metabolic Panel:  Recent Labs Lab 02/28/16 0404 02/28/16 1444 02/29/16 0505 03/01/16 0325 03/02/16 0440 03/02/16 1230  NA 135  --  136 138 145 145  K 4.4  --  4.8 3.8 2.6* 3.3*  CL 102  --  104 107 107 105  CO2 20*  --  19* 20* 27 27  GLUCOSE 286*  --  237* 295* 240* 219*  BUN 35*  --  47* 44* 35* 30*  CREATININE 2.13*  --  2.59* 1.98* 1.49* 1.48*  CALCIUM 8.6*  --  7.4* 7.4* 7.9* 8.1*  MG  --  2.2 1.9 2.1 1.8  --   PHOS  --   --   --  2.5 1.6*  --    Liver Function Tests:  Recent Labs Lab 02/28/16 1845 02/29/16 0505 03/01/16 0325  AST 1563* 3759* 883*  ALT 1143* 1930* 1269*  ALKPHOS 127* 109 92  BILITOT 2.5* 2.0* 1.5*  PROT 6.1* 5.1* 5.0*  ALBUMIN 2.7* 2.5* 2.2*   No results for input(s): LIPASE,  AMYLASE in the last 168 hours.  Recent Labs Lab 02/28/16 1504  AMMONIA 178*   CBC:  Recent Labs Lab 02/27/16 1204 02/27/16 1838 02/28/16 0404 02/29/16 0505 03/01/16 0325 03/02/16 1558  WBC 18.8* 22.6* 20.1* 12.8* 12.4*  --   HGB 13.0 12.7* 11.7* 10.6* 11.1* 12.5*  HCT 41.9 40.5 37.7* 34.2* 35.1* 39.4  MCV 89.3 87.9 88.1 86.8 85.6  --   PLT 241 280 211 98* 123* 171   Cardiac Enzymes:  Recent Labs Lab 02/28/16 1008 02/28/16 1444 02/28/16 1845 03/01/16 1845  CKTOTAL  --   --   --  168  TROPONINI 0.18* 0.19* 0.33*  --    BNP (last 3 results)  Recent Labs  02/27/16 1204  BNP 1997.3*    ProBNP (last 3 results) No results for input(s): PROBNP in the last 8760 hours.  CBG:  Recent Labs Lab 03/02/16 1746 03/02/16 1931  03/03/16 0023 03/03/16 0346 03/03/16 0805  GLUCAP 407* 338* 246* 238* 115*    Recent Results (from the past 240 hour(s))  Culture, blood (Routine X 2) w Reflex to ID Panel     Status: None (Preliminary result)   Collection Time: 02/28/16  3:04 PM  Result Value Ref Range Status   Specimen Description BLOOD RIGHT ANTECUBITAL  Final   Special Requests IN PEDIATRIC BOTTLE .5CC  Final   Culture NO GROWTH 3 DAYS  Final   Report Status PENDING  Incomplete  MRSA PCR Screening     Status: None   Collection Time: 02/28/16  5:30 PM  Result Value Ref Range Status   MRSA by PCR NEGATIVE NEGATIVE Final    Comment:        The GeneXpert MRSA Assay (FDA approved for NASAL specimens only), is one component of a comprehensive MRSA colonization surveillance program. It is not intended to diagnose MRSA infection nor to guide or monitor treatment for MRSA infections.   Culture, blood (Routine X 2) w Reflex to ID Panel     Status: None (Preliminary result)   Collection Time: 02/28/16  6:45 PM  Result Value Ref Range Status   Specimen Description   Final    BLOOD AS REPORTED BY THE CENTER FOR DISEASE CONTROL   Special Requests BOTTLES DRAWN AEROBIC AND ANAEROBIC 10CC  Final   Culture NO GROWTH 3 DAYS  Final   Report Status PENDING  Incomplete  Respiratory virus panel     Status: Abnormal   Collection Time: 02/29/16  4:11 AM  Result Value Ref Range Status   Source - RVPAN NASAL SWAB  Corrected   Respiratory Syncytial Virus A Negative Negative Final   Respiratory Syncytial Virus B Negative Negative Final   Influenza A Negative Negative Final   Influenza B Negative Negative Final   Parainfluenza 1 Negative Negative Final   Parainfluenza 2 Negative Negative Final   Parainfluenza 3 Negative Negative Final   Metapneumovirus Negative Negative Final   Rhinovirus Positive (A) Negative Final   Adenovirus Negative Negative Final    Comment: (NOTE) Performed At: [facility] [place], [place] [id number] Lindon Romp MD [id number]      Studies: Dg Chest 2 View  03/03/2016  CLINICAL DATA:  Weakness and hypoxemia. EXAM: CHEST  2 VIEW COMPARISON:  03/01/2016 FINDINGS: Endotracheal tube and central line have been removed. Improved aeration in the lungs compared to the previous examination. Blunting at left costophrenic angle probably represents a small pleural effusion. Difficult to exclude a small right pleural  effusion. Negative for pneumothorax. Patient has had a median sternotomy. Surgical plate in the lower cervical spine. IMPRESSION: Removal of support apparatuses and improved aeration in the lungs. Small left pleural effusion. Electronically Signed   By: Markus Daft M.D.   On: 03/03/2016 08:39    Scheduled Meds: . antiseptic oral rinse  7 mL Mouth Rinse q12n4p  . aspirin  81 mg Per Tube Daily  . insulin aspart  0-20 Units Subcutaneous 6 times per day  . lactulose  30 g Per Tube BID  . pantoprazole  40 mg Oral q1800  . 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     Niel Hummer A  [facility] Pager [phone]. If 7PM-7AM, please contact night-coverage at [email or web address], password [facility] 03/03/2016, 9:30 AM  LOS: 5 days

## 2016-03-03 NOTE — Progress Notes (Signed)
PROGRESS NOTE  Subjective:   75 y/o male followed by Dr Percival Spanish in the past. He has a history of CAD and had combined CABG x 5 with RCEA in 2010. He has DM with neuropathy and CRI Admitted with generalized weakness Was found to have moderate - severe Aortic stenosis by echo    Objective:    Vital Signs:   Temp:  [97.7 F (36.5 C)-98.2 F (36.8 C)] 97.7 F (36.5 C) (03/15 0349) Pulse Rate:  [77-94] 88 (03/15 0349) Resp:  [20-35] 32 (03/14 2200) BP: (93-152)/(49-122) 152/83 mmHg (03/15 0349) SpO2:  [94 %-99 %] 96 % (03/15 0349) Weight:  [205 lb 1.6 oz (93.033 kg)-206 lb (93.441 kg)] 205 lb 1.6 oz (93.033 kg) (03/15 0407)  Last BM Date: 03/02/16   24-hour weight change: Weight change: -6 lb 11.9 oz (-3.059 kg)  Weight trends: Filed Weights   03/02/16 0239 03/02/16 2302 03/03/16 0407  Weight: 212 lb 11.9 oz (96.5 kg) 206 lb (93.441 kg) 205 lb 1.6 oz (93.033 kg)    Intake/Output:  03/14 0701 - 03/15 0700 In: 500 [P.O.:240; I.V.:110; IV Piggyback:150] Out: 5176 [Urine:5175; Stool:1] Total I/O In: -  Out: 450 [Urine:450]   Physical Exam: BP 152/83 mmHg  Pulse 88  Temp(Src) 97.7 F (36.5 C) (Oral)  Resp 32  Ht 5\' 10"  (1.778 m)  Wt 205 lb 1.6 oz (93.033 kg)  BMI 29.43 kg/m2  SpO2 96%  Wt Readings from Last 3 Encounters:  03/03/16 205 lb 1.6 oz (93.033 kg)  02/03/16 212 lb (96.163 kg)  10/29/15 198 lb 12.8 oz (90.175 kg)    General: Vital signs reviewed and noted. , obese male , nAD  Head: Normocephalic, atraumatic.  Eyes: conjunctivae/corneas clear.  EOM's intact.   Throat: normal  Neck:  thick   Lungs:    rales, left base   Heart:  RR, [id number] systolic murmur   Abdomen:  Obese   Extremities: No c/c/e   Neurologic: A&O X3, CN II - XII are grossly intact.   Psych: Normal     Labs: BMET:  Recent Labs  03/01/16 0325 03/02/16 0440 03/02/16 1230 03/03/16 0952  NA 138 145 145 143  K 3.8 2.6* 3.3* 3.3*  CL 107 107 105 107  CO2 20* 27 27 26     GLUCOSE 295* 240* 219* 247*  BUN 44* 35* 30* 22*  CREATININE 1.98* 1.49* 1.48* 1.20  CALCIUM 7.4* 7.9* 8.1* 8.0*  MG 2.1 1.8  --   --   PHOS 2.5 1.6*  --   --     Liver function tests:  Recent Labs  03/01/16 0325  AST 883*  ALT 1269*  ALKPHOS 92  BILITOT 1.5*  PROT 5.0*  ALBUMIN 2.2*   No results for input(s): LIPASE, AMYLASE in the last 72 hours.  CBC:  Recent Labs  03/01/16 0325 03/02/16 1558  WBC 12.4*  --   HGB 11.1* 12.5*  HCT 35.1* 39.4  MCV 85.6  --   PLT 123* 171    Cardiac Enzymes:  Recent Labs  03/01/16 1845  CKTOTAL 168    Coagulation Studies:  Recent Labs  03/01/16 0325 03/02/16 1558  LABPROT 28.2* 23.0*  INR 2.69* 2.05*    Other: Invalid input(s): POCBNP No results for input(s): DDIMER in the last 72 hours. No results for input(s): HGBA1C in the last 72 hours. No results for input(s): CHOL, HDL, LDLCALC, TRIG, CHOLHDL in the last 72 hours. No results for input(s): TSH,  T4TOTAL, T3FREE, THYROIDAB in the last 72 hours.  Invalid input(s): FREET3 No results for input(s): VITAMINB12, FOLATE, FERRITIN, TIBC, IRON, RETICCTPCT in the last 72 hours.      Medications:    Infusions:    Scheduled Medications: . antiseptic oral rinse  7 mL Mouth Rinse q12n4p  . aspirin  81 mg Per Tube Daily  . insulin aspart  0-20 Units Subcutaneous 6 times per day  . lactulose  30 g Per Tube BID  . pantoprazole  40 mg Oral q1800  . 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 Aortic stenosis:   He denies any sypmtoms related to AS .  His main complaint is muscular weakness. Thought to have a viral pneumonia He needs to regain his strength quite a bit before he is a candidate for any valve surgery  .   He would be at very high risk for standard aVR ( and I would not recommend) .    Even with TAVR, he is at some risk and needs to improve significantly   2. CAD  3.   Disposition:  Length of Stay: 5  Thayer Headings, Brooke Bonito., MD, [facility] 03/03/2016, 12:35 PM Office [phone] Pager [phone]

## 2016-03-03 NOTE — Care Management Important Message (Signed)
Important Message  Patient Details  Name: Anthony Holder MRN: [id number] Date of Birth: 1941-06-02   Medicare Important Message Given:  Yes    Anthony Holder P [place] 03/03/2016, 11:04 AM

## 2016-03-03 NOTE — Progress Notes (Signed)
Patient arrived to room 3E08 from [place]. Alert and oriented X4. NSR on telemetry. IV infusing. Foley cath draining clear yellow urine. Patient oriented to room and call system.

## 2016-03-04 DIAGNOSIS — I5033 Acute on chronic diastolic (congestive) heart failure: Secondary | ICD-10-CM

## 2016-03-04 LAB — GLUCOSE, CAPILLARY
GLUCOSE-CAPILLARY: 401 mg/dL — AB (ref 65–99)
GLUCOSE-CAPILLARY: 433 mg/dL — AB (ref 65–99)
GLUCOSE-CAPILLARY: 413 mg/dL — AB (ref 65–99)
GLUCOSE-CAPILLARY: 225 mg/dL — AB (ref 65–99)
GLUCOSE-CAPILLARY: 223 mg/dL — AB (ref 65–99)
Glucose-Capillary: 162 mg/dL — ABNORMAL HIGH (ref 65–99)
Glucose-Capillary: 247 mg/dL — ABNORMAL HIGH (ref 65–99)
Glucose-Capillary: 294 mg/dL — ABNORMAL HIGH (ref 65–99)
Glucose-Capillary: 298 mg/dL — ABNORMAL HIGH (ref 65–99)

## 2016-03-04 LAB — COMPREHENSIVE METABOLIC PANEL
ALBUMIN: 2.6 g/dL — AB (ref 3.5–5.0)
ALT: 447 U/L — ABNORMAL HIGH (ref 17–63)
ANION GAP: 9 (ref 5–15)
AST: 131 U/L — ABNORMAL HIGH (ref 15–41)
Alkaline Phosphatase: 93 U/L (ref 38–126)
BILIRUBIN TOTAL: 1.1 mg/dL (ref 0.3–1.2)
BUN: 17 mg/dL (ref 6–20)
CHLORIDE: 104 mmol/L (ref 101–111)
CO2: 27 mmol/L (ref 22–32)
Calcium: 8.2 mg/dL — ABNORMAL LOW (ref 8.9–10.3)
Creatinine, Ser: 1.22 mg/dL (ref 0.61–1.24)
GFR calc Af Amer: >60 mL/min (ref >60)
GFR calc non Af Amer: 57 mL/min — ABNORMAL LOW (ref >60)
GLUCOSE: 230 mg/dL — AB (ref 65–99)
POTASSIUM: 3.5 mmol/L (ref 3.5–5.1)
SODIUM: 140 mmol/L (ref 135–145)
TOTAL PROTEIN: 6 g/dL — AB (ref 6.5–8.1)

## 2016-03-04 LAB — CULTURE, BLOOD (ROUTINE X 2)
CULTURE: NO GROWTH
Culture: NO GROWTH

## 2016-03-04 LAB — CBC
HCT: 38 % — ABNORMAL LOW (ref 39.0–52.0)
Hemoglobin: 11.6 g/dL — ABNORMAL LOW (ref 13.0–17.0)
MCH: 26.6 pg (ref 26.0–34.0)
MCHC: 30.5 g/dL (ref 30.0–36.0)
MCV: 87.2 fL (ref 78.0–100.0)
PLATELETS: 158 10*3/uL (ref 150–400)
RBC: 4.36 MIL/uL (ref 4.22–5.81)
RDW: 16.6 % — AB (ref 11.5–15.5)
WBC: 12 10*3/uL — AB (ref 4.0–10.5)

## 2016-03-04 LAB — PHOSPHORUS: Phosphorus: 2.5 mg/dL (ref 2.5–4.6)

## 2016-03-04 NOTE — Progress Notes (Signed)
[facility] PROGRESS NOTE  Anthony Holder [id number] DOB: 10/12/1941 DOA: 02/27/2016 PCP: [name], MD  Assessment/Plan: 75 yo male admitted with 1 week of weakness /cough ? Influenza with progressive resp decompensation and hypotension c/w septic shock requiring intubation and pressor support. Self Extubated on 3/13  1-Acute Hypoxic Resp Failure  Might be related to acute viral illness vs HF  Lasix on hold.  Repeated chest x ray: improved aeration. Small left pleural effusion.  Negative 3 L.  Will resume lasix at discharge.  Rhinovirus positive.   Cardiogenic shock ,  AS severe Resolved. BP now elevated.  ECHO; Ef 25 to 30 %, moderate to severe Aortic valve stenosis.  Antibiotics discontinue by CCM. Follow WBC trend,  Cbc stable.   Weakness, deconditioning;  Related to acute illness. Needs SNF   Acute Renal Failure; thought to be related cardio renal.  Improved.  Phosphorus Nl   Transamnitis ? Etiology Possible shock liver vs occult etiology - from cardiogenic shock likely Positive hepatitis A antibody.  IgM negative.  [place] mild fatty infiltration of liver.  Elevated Ammonia, continue with lactulose.  LFT Trending down.   Dysphagia; dysphagia 3 diet   DM; Continue with lantus. Continue with SSI.   Acute Encephalopathy secondary to illness -improving slowly. He is more alert today.    Code Status: Full Code.  Family Communication: care discussed with patient  Disposition Plan: SNF 3-17   Consultants:  Cardiology   Procedures: SIGNIFICANT EVENTS: 3/11 resp distress trans to ICU  3/13- extubated self  LINES/TUBES: 3/11 ETT >>>self 3/13 3/11 R IJ CVL >>  Antibiotics:  Discontinue by CCM   HPI/Subjective: He is more alert today, he is sitting in the chair.  He is breathing better. He has been in bed for more than 3 weeks because of callus in his stump, and was not able to ambulate.    Objective: Filed Vitals:   03/04/16 0758  03/04/16 1209  BP: 130/70 154/86  Pulse: 96 86  Temp:  98 F (36.7 C)  Resp:  18    Intake/Output Summary (Last 24 hours) at 03/04/16 1530 Last data filed at 03/04/16 1333  Gross per 24 hour  Intake    820 ml  Output   2600 ml  Net  -1780 ml   Filed Weights   03/02/16 2302 03/03/16 0407 03/04/16 0402  Weight: 93.441 kg (206 lb) 93.033 kg (205 lb 1.6 oz) 91.989 kg (202 lb 12.8 oz)    Exam:   General:  Ore alert today   Cardiovascular: S 1, S 2 RRR, systolic murmur  Respiratory: no wheezing, no crackles.   Abdomen: Bs present, soft, nt  Musculoskeletal: trace edema  Data Reviewed: Basic Metabolic Panel:  Recent Labs Lab 02/28/16 1444 02/29/16 0505 03/01/16 0325 03/02/16 0440 03/02/16 1230 03/03/16 0952 03/04/16 0450  NA  --  136 138 145 145 143 140  K  --  4.8 3.8 2.6* 3.3* 3.3* 3.5  CL  --  104 107 107 105 107 104  CO2  --  19* 20* 27 27 26 27   GLUCOSE  --  237* 295* 240* 219* 247* 230*  BUN  --  47* 44* 35* 30* 22* 17  CREATININE  --  2.59* 1.98* 1.49* 1.48* 1.20 1.22  CALCIUM  --  7.4* 7.4* 7.9* 8.1* 8.0* 8.2*  MG 2.2 1.9 2.1 1.8  --   --   --   PHOS  --   --  2.5 1.6*  --   --  2.5   Liver Function Tests:  Recent Labs Lab 02/28/16 1845 02/29/16 0505 03/01/16 0325 03/03/16 1513 03/04/16 0450  AST 1563* 3759* 883* 193* 131*  ALT 1143* 1930* 1269* 565* 447*  ALKPHOS 127* 109 92 103 93  BILITOT 2.5* 2.0* 1.5* 1.4* 1.1  PROT 6.1* 5.1* 5.0* 5.7* 6.0*  ALBUMIN 2.7* 2.5* 2.2* 2.6* 2.6*   No results for input(s): LIPASE, AMYLASE in the last 168 hours.  Recent Labs Lab 02/28/16 1504 03/03/16 1513  AMMONIA 178* 50*   CBC:  Recent Labs Lab 02/27/16 1838 02/28/16 0404 02/29/16 0505 03/01/16 0325 03/02/16 1558 03/04/16 0450  WBC 22.6* 20.1* 12.8* 12.4*  --  12.0*  HGB 12.7* 11.7* 10.6* 11.1* 12.5* 11.6*  HCT 40.5 37.7* 34.2* 35.1* 39.4 38.0*  MCV 87.9 88.1 86.8 85.6  --  87.2  PLT 280 211 98* 123* 171 158   Cardiac Enzymes:  Recent  Labs Lab 02/28/16 1008 02/28/16 1444 02/28/16 1845 03/01/16 1845  CKTOTAL  --   --   --  168  TROPONINI 0.18* 0.19* 0.33*  --    BNP (last 3 results)  Recent Labs  02/27/16 1204  BNP 1997.3*    ProBNP (last 3 results) No results for input(s): PROBNP in the last 8760 hours.  CBG:  Recent Labs Lab 03/03/16 1943 03/03/16 2359 03/04/16 0356 03/04/16 0739 03/04/16 1130  GLUCAP 198* 294* 225* 162* 298*    Recent Results (from the past 240 hour(s))  Culture, blood (Routine X 2) w Reflex to ID Panel     Status: None   Collection Time: 02/28/16  3:04 PM  Result Value Ref Range Status   Specimen Description BLOOD RIGHT ANTECUBITAL  Final   Special Requests IN PEDIATRIC BOTTLE .5CC  Final   Culture NO GROWTH 5 DAYS  Final   Report Status 03/04/2016 FINAL  Final  MRSA PCR Screening     Status: None   Collection Time: 02/28/16  5:30 PM  Result Value Ref Range Status   MRSA by PCR NEGATIVE NEGATIVE Final    Comment:        The GeneXpert MRSA Assay (FDA approved for NASAL specimens only), is one component of a comprehensive MRSA colonization surveillance program. It is not intended to diagnose MRSA infection nor to guide or monitor treatment for MRSA infections.   Culture, blood (Routine X 2) w Reflex to ID Panel     Status: None   Collection Time: 02/28/16  6:45 PM  Result Value Ref Range Status   Specimen Description   Final    BLOOD AS REPORTED BY THE CENTER FOR DISEASE CONTROL   Special Requests BOTTLES DRAWN AEROBIC AND ANAEROBIC 10CC  Final   Culture NO GROWTH 5 DAYS  Final   Report Status 03/04/2016 FINAL  Final  Respiratory virus panel     Status: Abnormal   Collection Time: 02/29/16  4:11 AM  Result Value Ref Range Status   Source - RVPAN NASAL SWAB  Corrected   Respiratory Syncytial Virus A Negative Negative Final   Respiratory Syncytial Virus B Negative Negative Final   Influenza A Negative Negative Final   Influenza B Negative Negative Final    Parainfluenza 1 Negative Negative Final   Parainfluenza 2 Negative Negative Final   Parainfluenza 3 Negative Negative Final   Metapneumovirus Negative Negative Final   Rhinovirus Positive (A) Negative Final   Adenovirus Negative Negative Final    Comment: (NOTE) Performed At: [place], [place]  [id number] Lindon Romp MD [id number]      Studies: Dg Chest 2 View  03/03/2016  CLINICAL DATA:  Weakness and hypoxemia. EXAM: CHEST  2 VIEW COMPARISON:  03/01/2016 FINDINGS: Endotracheal tube and central line have been removed. Improved aeration in the lungs compared to the previous examination. Blunting at left costophrenic angle probably represents a small pleural effusion. Difficult to exclude a small right pleural effusion. Negative for pneumothorax. Patient has had a median sternotomy. Surgical plate in the lower cervical spine. IMPRESSION: Removal of support apparatuses and improved aeration in the lungs. Small left pleural effusion. Electronically Signed   By: Markus Daft M.D.   On: 03/03/2016 08:39    Scheduled Meds: . antiseptic oral rinse  7 mL Mouth Rinse q12n4p  . aspirin  81 mg Per Tube Daily  . insulin aspart  0-20 Units Subcutaneous 6 times per day  . insulin glargine  15 Units Subcutaneous Daily  . lactulose  30 g Per Tube BID  . pantoprazole  40 mg Oral q1800  . 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     Niel Hummer A  [facility] Pager [phone]. If 7PM-7AM, please contact night-coverage at [email or web address], password [facility] 03/04/2016, 3:30 PM  LOS: 6 days

## 2016-03-04 NOTE — Progress Notes (Signed)
Subjective: No CP  Objective: Vital signs in last 24 hours: Temp:  [97.5 F (36.4 C)-97.8 F (36.6 C)] 97.5 F (36.4 C) (03/16 0401) Pulse Rate:  [81-98] 98 (03/16 0401) Resp:  [18-20] 20 (03/16 0401) BP: (130-141)/(71-87) 136/75 mmHg (03/16 0401) SpO2:  [93 %-97 %] 93 % (03/16 0401) Weight:  [202 lb 12.8 oz (91.989 kg)] 202 lb 12.8 oz (91.989 kg) (03/16 0402) Last BM Date: 03/02/16  Intake/Output from previous day: 03/15 0701 - 03/16 0700 In: 560 [P.O.:560] Out: 1800 [Urine:1800] Intake/Output this shift: Total I/O In: -  Out: 500 [Urine:500]  Medications Scheduled Meds: . antiseptic oral rinse  7 mL Mouth Rinse q12n4p  . aspirin  81 mg Per Tube Daily  . insulin aspart  0-20 Units Subcutaneous 6 times per day  . insulin glargine  15 Units Subcutaneous Daily  . lactulose  30 g Per Tube BID  . pantoprazole  40 mg Oral q1800  . sodium chloride flush  3 mL Intravenous Q12H   Continuous Infusions:  PRN Meds:.acetaminophen (TYLENOL) oral liquid 160 mg/5 mL, albuterol  PE: General appearance: alert, cooperative and no distress Neck: no JVD Lungs: bibasilar crackles.  No wheezing Heart: regular rate and rhythm and 3/6 sys MM Abdomen: +bs, nontender Extremities: No Right LEE Pulses: 2+ and symmetric Skin: warm and dry Neurologic: Grossly normal  Lab Results:   Recent Labs  03/02/16 1558 03/04/16 0450  WBC  --  12.0*  HGB 12.5* 11.6*  HCT 39.4 38.0*  PLT 171 158   BMET  Recent Labs  03/02/16 1230 03/03/16 0952 03/04/16 0450  NA 145 143 140  K 3.3* 3.3* 3.5  CL 105 107 104  CO2 27 26 27   GLUCOSE 219* 247* 230*  BUN 30* 22* 17  CREATININE 1.48* 1.20 1.22  CALCIUM 8.1* 8.0* 8.2*   PT/INR  Recent Labs  03/02/16 1558  LABPROT 23.0*  INR 2.05*     Assessment/Plan   Acute on chronic congestive heart failure (HCC) Net fluids: -1.2L/-5.0L.  Last dose of IV lasix was 3/14.  No PO either.  Basilar crackles on exam. Cold be atelectasis.  Small  left pleural effusion.  Not grossly volume overloaded.  No lasix needed now.    Moderate to severe aortic stenosis  Future TAVR?    Elevated troponin  0.19. Likely demand ischemia from sepsis.     Cardiomyopathy Virally mediated or ishemic?  Troponin only up to 0.19 but akinesis of the inferior myocardium.   His EF in 2011 was normal.  Recommend ischemic eval with L heart cath when able, especially if we are consider TAVR in the future.    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  Agree with assessment and plan as noted above.  Changes made to the above note as needed.  1. Generalized weakness:    Was up in  the chair today  I think he could be DC to the SNF and get stronger . He can see Dr. Percival Spanish or PA for further eval of AS in the clinic .  He was on Lasix 40 BID and Kdur 20 a day as OP Could try him on Lasix 40 a day with Kdur 20 a day   Will sign off.  Call for questions   Ramond Dial., MD, [facility] 03/04/2016, 12:02 PM [address],  [place] Pager [phone]

## 2016-03-04 NOTE — Evaluation (Signed)
Occupational Therapy Evaluation Patient Details Name: Anthony Holder MRN: [id number] DOB: Mar 29, 1941 Today's Date: 03/04/2016    History of Present Illness 75 y/o male followed by Dr Percival Spanish in the past. 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 Pt currently with functional limitations due to the deficits listed below (see OT Problem List).  Pt will benefit from skilled OT to increase their safety and independence with ADL and functional mobility for ADL to facilitate discharge to venue listed below.      Follow Up Recommendations  SNF    Equipment Recommendations  None recommended by OT       Precautions / Restrictions Precautions Precautions: Fall      Mobility Bed Mobility Overal bed mobility: Needs Assistance;+2 for physical assistance Bed Mobility: Supine to Sit     Supine to sit: Mod assist;+2 for physical assistance        Transfers Overall transfer level: Needs assistance Equipment used: Rolling walker (2 wheeled) Transfers: Sit to/from [facility] Sit to Stand: +2 physical assistance;Mod assist Stand pivot transfers: +2 physical assistance;Mod assist                 ADL Overall ADL's : Needs assistance/impaired Eating/Feeding: Set up;Sitting   Grooming: Set up;Sitting   Upper Body Bathing: Set up;Sitting   Lower Body Bathing: Maximal assistance;Sit to/from stand;Cueing for safety;Cueing for sequencing;+2 for safety/equipment   Upper Body Dressing : Sitting   Lower Body Dressing: Maximal assistance;Sit to/from stand;+2 for safety/equipment;Cueing for sequencing;Cueing for safety                 General ADL Comments: Pt did don his prosthetic with increased time and VC.               Pertinent Vitals/Pain Pain Assessment: No/denies pain     Hand Dominance     Extremity/Trunk Assessment Upper Extremity  Assessment Upper Extremity Assessment: Generalized weakness;RUE deficits/detail RUE Deficits / Details: pt explains R UE as stiff           Communication Communication Communication: No difficulties   Cognition Arousal/Alertness: Awake/alert Behavior During Therapy: Flat affect Overall Cognitive Status: Within Functional Limits for tasks assessed                                Home Living Family/patient expects to be discharged to:: Private residence Living Arrangements: Spouse/significant other Available Help at Discharge: Family Type of Home: Mobile home Home Access: Ramped entrance     Home Layout: One level               Home Equipment: Environmental consultant - 2 wheels;Shower seat;Wheelchair - manual;Cane - single point          Prior Functioning/Environment Level of Independence: Independent with assistive device(s)             OT Diagnosis: Generalized weakness   OT Problem List: Decreased strength;Decreased activity tolerance;Decreased safety awareness   OT Treatment/Interventions: Self-care/ADL training;DME and/or AE instruction;Patient/family education    OT Goals(Current goals can be found in the care plan section) Acute Rehab OT Goals Patient Stated Goal: get my new leg OT Goal Formulation: With patient Time For Goal Achievement: 03/18/16 Potential to Achieve Goals: Good ADL Goals Pt Will Perform Grooming: with supervision;standing Pt Will Perform Lower  Body Dressing: with supervision;sit to/from stand Pt Will Transfer to Toilet: with supervision;bedside commode;regular height toilet;ambulating Pt Will Perform Toileting - Clothing Manipulation and hygiene: with supervision;sit to/from stand  OT Frequency: Min 2X/week   Barriers to D/C:            Co-evaluation              End of Session Equipment Utilized During Treatment: Gait belt;Rolling walker  Activity Tolerance: Patient tolerated treatment well Patient left:     Time:  [id number] OT Time Calculation (min): 23 min Charges:  OT General Charges $OT Visit: 1 Procedure OT Evaluation $OT Eval Low Complexity: 1 Procedure G-Codes:    Payton Mccallum D 2016/04/01, 1:16 PM

## 2016-03-04 NOTE — Evaluation (Signed)
Physical Therapy Evaluation Patient Details Name: Anthony Holder MRN: [id number] DOB: 04/05/1941 Today's Date: 03/04/2016   History of Present Illness   75 yo male adm 02/27/15 with weakness and dyspnea; PMHx: CAD, combined CABG x 5 with RCEA in 2010, DM, neuropathy,  CRI, CVA, L BKA  Clinical Impression  Pt admitted with above diagnosis. Pt currently with functional limitations due to the deficits listed below (see PT Problem List). Pt will benefit from skilled PT to increase their independence and safety with mobility to allow discharge to the venue listed below.  Feel pt will benefit from STSNF prior to return home with wife;      Follow Up Recommendations SNF;Supervision/Assistance - 24 hour    Equipment Recommendations  None recommended by PT    Recommendations for Other Services       Precautions / Restrictions Precautions Precautions: Fall      Mobility  Bed Mobility Overal bed mobility: Needs Assistance;+2 for physical assistance Bed Mobility: Supine to Sit     Supine to sit: Mod assist;+2 for physical assistance     General bed mobility comments: incr time, assist for scooting in supine, assist for trunk and cues to self assist  Transfers Overall transfer level: Needs assistance Equipment used: Rolling walker (2 wheeled) Transfers: Sit to/from [employer] Sit to Stand: +2 physical assistance;Mod assist Stand pivot transfers: +2 physical assistance;Mod assist       General transfer comment: cues for hand placement, safety, control of descent  Ambulation/Gait                Stairs            Wheelchair Mobility    Modified Rankin (Stroke Patients Only)       Balance Overall balance assessment: Needs assistance Sitting-balance support: Feet supported;No upper extremity supported Sitting balance-Leahy Scale: Good Sitting balance - Comments: pt shifts wt all directions and dons prosthesis with close supervision; in static  sit he has occasional LOB to R and requires tactile and verbal cues to maintian midline   Standing balance support: Bilateral upper extremity supported Standing balance-Leahy Scale: Poor Standing balance comment: requires UE and external assist/support to maintain balance                             Pertinent Vitals/Pain Pain Assessment: No/denies pain    Home Living Family/patient expects to be discharged to:: Private residence Living Arrangements: Spouse/significant other Available Help at Discharge: Family Type of Home: Mobile home Home Access: Ramped entrance     Home Layout: One level Home Equipment: Environmental consultant - 2 wheels;Shower seat;Wheelchair - manual;Cane - single point      Prior Function Level of Independence: Independent with assistive device(s);Independent         Comments: pt wife reports up until a few wks ago pt ws I with ADLS and mod I with SPC for amb     Hand Dominance        Extremity/Trunk Assessment   Upper Extremity Assessment: Defer to OT evaluation RUE Deficits / Details: pt explains R UE as stiff         Lower Extremity Assessment: Generalized weakness;LLE deficits/detail   LLE Deficits / Details: BKA, knee AROM WFL     Communication   Communication: No difficulties  Cognition Arousal/Alertness: Awake/alert Behavior During Therapy: Flat affect Overall Cognitive Status: Within Functional Limits for tasks assessed Area of Impairment: Problem solving;Following commands  Following Commands: Follows one step commands with increased time     Problem Solving: Slow processing;Requires verbal cues;Requires tactile cues General Comments: cues to stay on task, requires repetition of commands and questions    General Comments      Exercises        Assessment/Plan    PT Assessment Patient needs continued PT services  PT Diagnosis Difficulty walking   PT Problem List Decreased strength;Decreased activity  tolerance;Decreased mobility;Decreased balance;Decreased knowledge of use of DME;Decreased safety awareness  PT Treatment Interventions DME instruction;Gait training;Functional mobility training;Therapeutic activities;Patient/family education;Therapeutic exercise;Balance training   PT Goals (Current goals can be found in the Care Plan section) Acute Rehab PT Goals Patient Stated Goal: get my new leg PT Goal Formulation: With patient/family Time For Goal Achievement: 03/18/16 Potential to Achieve Goals: Good    Frequency Min 3X/week   Barriers to discharge        Co-evaluation PT/OT/SLP Co-Evaluation/Treatment: Yes Reason for Co-Treatment: For patient/therapist safety PT goals addressed during session: Mobility/safety with mobility         End of Session   Activity Tolerance: Patient tolerated treatment well Patient left: in chair;with call bell/phone within reach;with chair alarm set           Time: 1130-1159 PT Time Calculation (min) (ACUTE ONLY): 29 min   Charges:   PT Evaluation $PT Eval Low Complexity: 1 Procedure     PT G Codes:        Anthony Holder 06-Mar-2016, 2:03 PM

## 2016-03-04 NOTE — Progress Notes (Signed)
Inpatient Diabetes Program Recommendations  AACE/ADA: New Consensus Statement on Inpatient Glycemic Control (2015)  Target Ranges:  Prepandial:   less than 140 mg/dL      Peak postprandial:   less than 180 mg/dL (1-2 hours)      Critically ill patients:  140 - 180 mg/dL   Review of Glycemic Control  Results for NAAMAN, MANLOVE (MRN [id number]) as of 03/04/2016 08:42  Ref. Range 03/03/2016 16:53 03/03/2016 19:43 03/03/2016 23:59 03/04/2016 03:56 03/04/2016 07:39  Glucose-Capillary Latest Ref Range: 65-99 mg/dL 412 (H) 198 (H) 294 (H) 225 (H) 162 (H)   Current orders; Novolog 0-20 units q4h, Lantus 15 units qhs  Inpatient Diabetes Program Recommendations: Please consider changing Novolog correction to Novolog 0-20 units tid, Novolog 0-5 qhs  Gentry Fitz, RN, [place], [place], CDE Diabetes Coordinator Inpatient Diabetes Program  [phone] (Team Pager) [phone] ([place]) 03/04/2016 8:43 AM

## 2016-03-05 LAB — BASIC METABOLIC PANEL
ANION GAP: 12 (ref 5–15)
BUN: 12 mg/dL (ref 6–20)
CO2: 26 mmol/L (ref 22–32)
Calcium: 8.6 mg/dL — ABNORMAL LOW (ref 8.9–10.3)
Chloride: 104 mmol/L (ref 101–111)
Creatinine, Ser: 0.99 mg/dL (ref 0.61–1.24)
GFR calc Af Amer: >60 mL/min (ref >60)
GLUCOSE: 163 mg/dL — AB (ref 65–99)
POTASSIUM: 3.4 mmol/L — AB (ref 3.5–5.1)
Sodium: 142 mmol/L (ref 135–145)

## 2016-03-05 LAB — GLUCOSE, CAPILLARY
GLUCOSE-CAPILLARY: 141 mg/dL — AB (ref 65–99)
GLUCOSE-CAPILLARY: 123 mg/dL — AB (ref 65–99)
Glucose-Capillary: 174 mg/dL — ABNORMAL HIGH (ref 65–99)
Glucose-Capillary: 179 mg/dL — ABNORMAL HIGH (ref 65–99)

## 2016-03-05 LAB — AMMONIA: AMMONIA: 47 umol/L — AB (ref 9–35)

## 2016-03-05 MED ORDER — POTASSIUM CHLORIDE CRYS ER 20 MEQ PO TBCR: 40.0000 meq | EXTENDED_RELEASE_TABLET | Freq: Once | ORAL | Status: DC

## 2016-03-05 MED ORDER — FUROSEMIDE 40 MG PO TABS: 40.0000 mg | ORAL_TABLET | Freq: Every day | ORAL | Status: DC

## 2016-03-05 MED ORDER — LACTULOSE 10 GM/15ML PO SOLN: 30.0000 g | Freq: Two times a day (BID) | ORAL | Status: DC

## 2016-03-05 MED ORDER — ATENOLOL 25 MG PO TABS: 25.0000 mg | ORAL_TABLET | Freq: Every day | ORAL | Status: DC

## 2016-03-05 NOTE — Progress Notes (Signed)
Pt able to void post foley cath removal this am.  Karie Kirks, RN.

## 2016-03-05 NOTE — Care Management Note (Signed)
Case Management Note  Patient Details  Name: Anthony Holder MRN: [id number] Date of Birth: 1941/01/05  Subjective/Objective:      Admitted with Severe Sepsis              Action/Plan: Patient lives at home with spouse, has a Producer, television/film/video and cane at home. Patient does not want to go to a SNF at discharge and is planning to return home with [name]. [place] choice offered, patient chose [place] with [facility] called for arrangements.  Expected Discharge Date:     03/05/2016             Expected Discharge Plan:  [place]  Discharge planning Services  CM Consult   HH Arranged:  RN, PT, OT, Nurse's Aide [facility] Agency:  [place]  Status of Service:  In process, will continue to follow  Medicare Important Message Given:  Yes  Sherrilyn Rist [phone] 03/05/2016, 10:15 AM

## 2016-03-05 NOTE — Discharge Summary (Addendum)
Physician Discharge Summary  Anthony Holder [id number] DOB: Oct 25, 1941 DOA: 02/27/2016  PCP: [name], MD  Admit date: 02/27/2016 Discharge date: 03/05/2016  Time spent: 35 minutes  Recommendations for Outpatient Follow-up:  Needs follow up with cardio for further evaluation of aortic valve stenosis.   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   Hx of CABG x 5 2010   Carotid artery disease-RCEA 2010   BPH (benign prostatic hypertrophy)   History of CVA (cerebrovascular accident)   Moderate to severe aortic stenosis   S/P BKA (below knee amputation) unilateral (HCC)   Weakness   Elevated troponin   Acute kidney injury superimposed on CKD ([place])   Arterial hypotension   Respiratory distress   Acute on chronic congestive heart failure (HCC)   Abdominal pain   Discharge Condition: stable  Diet recommendation: heart healthy   Filed Weights   03/03/16 0407 03/04/16 0402 03/05/16 0455  Weight: 93.033 kg (205 lb 1.6 oz) 91.989 kg (202 lb 12.8 oz) 91.354 kg (201 lb 6.4 oz)    History of present illness:  Anthony Holder is a 75 y.o. male, With past medical history of coronary artery disease, chronic diastolic CHF, diabetes mellitus, aortic stenosis presents to ED from his PCP office secondary to worsening generalized weakness over one week, and worsening dyspnea, patient denies any cough or productive sputum, reports exertional dyspnea, denies any orthopnea, with significant lower extremity edema, reports he has been holding his Lasix over last week as he has not been feeling well, and with poor oral intake, denies any chest pain, abdominal pain, fever or chills, chest x-ray significant for cardiomegaly, but no volume overload or infectious process, workup significant for leukocytosis and elevated BNP, urinalysis pending, I was called to  admit.    Hospital Course:  75 yo male admitted with 1 week of weakness /cough ? Influenza with progressive resp decompensation and hypotension c/w septic shock requiring intubation and pressor support. Self Extubated on 3/13  1-Acute Hypoxic Resp Failure  Might be related to acute viral illness vs HF  3/11 resp distress trans to ICU  3/13- extubated self Repeated chest x ray: improved aeration. Small left pleural effusion.  Negative 3 L.  Will resume lasix at discharge.  Rhinovirus positive.   Cardiogenic shock , AS severe Resolved. BP now elevated.  ECHO; Ef 25 to 30 %, moderate to severe Aortic valve stenosis.  Antibiotics discontinue by CCM. Follow WBC trend,  Cbc stable.  No septic shock.  Sepsis was ruled out.   Weakness, deconditioning;  Related to acute illness. Needs SNF , but patient and family wants to be discharge home  Acute Renal Failure; thought to be related cardio renal.  Improved.  Phosphorus Nl   Transamnitis ? Etiology Possible shock liver vs occult etiology - from cardiogenic shock likely Positive hepatitis A antibody. IgM negative.  [place] mild fatty infiltration of liver.  Elevated Ammonia, continue with lactulose.  LFT Trending down.   Dysphagia; dysphagia 3 diet   DM; Continue with lantus. Continue with SSI.   Acute Encephalopathy secondary to illness -improving slowly. He is more alert today. Might be related to infection, viral, Vs encephalopathy , treating with lactulose.    Procedures: SIGNIFICANT EVENTS: 3/11 resp distress trans to ICU  3/13- extubated self  LINES/TUBES: 3/11 ETT >>>self 3/13 3/11 R IJ CVL >>  Consultations:  Cardiology  Discharge Exam: Filed Vitals:   03/04/16 2013 03/05/16 0455  BP: 152/80 155/85  Pulse: 100 98  Temp: 99.1 F (37.3 C) 98.2 F (36.8 C)  Resp: 18 18    General: NAD Cardiovascular: S 1, S 2 RRR Respiratory: CTA  Discharge Instructions   Discharge Instructions    Diet -  low sodium heart healthy    Complete by:  As directed      Increase activity slowly    Complete by:  As directed           Current Discharge Medication List    START taking these medications   Details  lactulose (CHRONULAC) 10 GM/15ML solution Place 45 mLs (30 g total) into feeding tube 2 (two) times daily. Qty: 240 mL, Refills: 0      CONTINUE these medications which have CHANGED   Details  atenolol (TENORMIN) 25 MG tablet Take 1 tablet (25 mg total) by mouth daily. Qty: 30 tablet, Refills: 0    furosemide (LASIX) 40 MG tablet Take 1 tablet (40 mg total) by mouth daily. Qty: 180 tablet, Refills: 0      CONTINUE these medications which have NOT CHANGED   Details  aspirin 81 MG tablet Take 81 mg by mouth daily.    CALCIUM-MAGNESIUM-ZINC PO Take 1 tablet by mouth daily.     Cholecalciferol (VITAMIN D-3) 1000 UNITS CAPS Take 5,000 Units by mouth daily.     fish oil-omega-3 fatty acids 1000 MG capsule Take 1 g by mouth 2 (two) times daily.    gabapentin (NEURONTIN) 300 MG capsule Take 1 capsule (300 mg total) by mouth 3 (three) times daily. Qty: 270 capsule, Refills: 4    NOVOLIN N RELION 100 UNIT/ML injection START WITH INJECTING 30 UNITS SUBCUTANEOUSLY WITH BREAKFAST AND 15 UNITS WITH SUPPER AS DIRECTED Qty: 10 mL, Refills: 99    potassium chloride SA (K-DUR,KLOR-CON) 20 MEQ tablet Take 1 tablet (20 mEq total) by mouth daily. Qty: 90 tablet, Refills: 3    Probiotic Product (PROBIOTIC PO) Take 1 capsule by mouth 2 (two) times daily.     traMADol (ULTRAM) 50 MG tablet Take 1 tablet (50 mg total) by mouth every 6 (six) hours as needed. Qty: 360 tablet, Refills: 1    vitamin B-12 (CYANOCOBALAMIN) 500 MCG tablet Take 500 mcg by mouth daily.     vitamin C (ASCORBIC ACID) 500 MG tablet Take 500 mg by mouth daily.     VITAMIN E PO Take 1 tablet by mouth daily.    nitroGLYCERIN (NITROSTAT) 0.4 MG SL tablet Place 1 tablet (0.4 mg total) under the tongue every 5 (five)  minutes as needed for chest pain. Qty: 25 tablet, Refills: 11      STOP taking these medications     Chromium-Cinnamon (CINNAMON PLUS CHROMIUM) [phone] MCG-MG CAPS      Garlic [id number] MG CAPS      metFORMIN (GLUCOPHAGE-XR) 500 MG 24 hr tablet        Allergies  Allergen Reactions  . Statins Other (See Comments)    Patient prefers to not take statins   Follow-up Information    Follow up with [facility].   Why:  They will do your home health care at your home   Contact information:   [address] [place] [address] [phone]       Follow up with [name], MD In 1 week.   Specialty:  Internal Medicine   Contact information:   [phone]  [employer] [employer] [address] [place] [address] [phone]        The results of significant diagnostics from this hospitalization (including imaging, microbiology, ancillary and laboratory) are listed below for reference.    Significant Diagnostic Studies: Dg Chest 2 View  03/03/2016  CLINICAL DATA:  Weakness and hypoxemia. EXAM: CHEST  2 VIEW COMPARISON:  03/01/2016 FINDINGS: Endotracheal tube and central line have been removed. Improved aeration in the lungs compared to the previous examination. Blunting at left costophrenic angle probably represents a small pleural effusion. Difficult to exclude a small right pleural effusion. Negative for pneumothorax. Patient has had a median sternotomy. Surgical plate in the lower cervical spine. IMPRESSION: Removal of support apparatuses and improved aeration in the lungs. Small left pleural effusion. Electronically Signed   By: Markus Daft M.D.   On: 03/03/2016 08:39   Dg Chest 2 View  02/27/2016  CLINICAL DATA:  Acute shortness of breath and weakness. EXAM: CHEST  2 VIEW COMPARISON:  04/17/2015 and prior exams FINDINGS: Cardiomegaly and CABG changes again noted. Mild left basilar scarring is present. There is no evidence of focal airspace disease, pulmonary  edema, suspicious pulmonary nodule/mass, pleural effusion, or pneumothorax. No acute bony abnormalities are identified. Cervical spine surgical hardware noted. IMPRESSION: Cardiomegaly without evidence of acute cardiopulmonary disease. Electronically Signed   By: Margarette Canada M.D.   On: 02/27/2016 12:11   US Abdomen Complete  02/29/2016  CLINICAL DATA:  Abdominal pain. Elevated LFTs. Acute renal failure. EXAM: ABDOMEN ULTRASOUND COMPLETE COMPARISON:  CT 10/06/2006 FINDINGS: Gallbladder: Prior cholecystectomy Common bile duct: Diameter: Slightly prominent, 7 mm, likely roof be related to post cholecystectomy state and patient's age Liver: Coarsened, coarsened, mildly increased in echotexture suggesting fatty infiltration. 2.2 cm hypoechoic area noted anteriorly was seen on 2007 CT, likely small cyst. IVC: No abnormality visualized. Pancreas: Visualized portion unremarkable. Spleen: Size and appearance within normal limits. Right Kidney: Length: 10.2 cm. Echogenicity within normal limits. No mass or hydronephrosis visualized. Left Kidney: Length: 12.4 cm. Nonobstructing stones in the lower pole, the largest 1.5 cm . Echogenicity within normal limits. No mass or hydronephrosis visualized. Abdominal aorta: No aneurysm visualized. Other findings: None. IMPRESSION: Suspect mild fatty infiltration of the liver. Prior cholecystectomy. Left nephrolithiasis. Electronically Signed   By: Rolm Baptise M.D.   On: 02/29/2016 11:14   Dg Chest Port 1 View  03/01/2016  CLINICAL DATA:  Respiratory failure. EXAM: PORTABLE CHEST 1 VIEW COMPARISON:  02/28/2016, 02/27/2016 and 04/17/2015 FINDINGS: Endotracheal tube, NG tube and central line appear in good position. Aortic atherosclerosis. CABG. New right pleural effusion with new bibasilar atelectasis, minimal on the left. Pulmonary vascularity is normal. Bones appear normal. IMPRESSION: New right pleural effusion with new bibasilar atelectasis. Electronically Signed   By: Lorriane Shire M.D.   On: 03/01/2016 07:46   Dg Chest Port 1 View  02/28/2016  CLINICAL DATA:  75 year old male status post central line placement. EXAM: PORTABLE CHEST 1 VIEW COMPARISON:  Chest x-ray 02/27/2016. FINDINGS: An endotracheal tube is in place with tip 2.0 cm above the carina. There is a right-sided internal jugular central venous catheter with tip terminating in the distal superior vena cava. A nasogastric tube is seen extending into the stomach, however, the tip of the nasogastric tube extends below the lower margin of the image. Status post median sternotomy for CABG, including LIMA to the LAD. Lung volumes are low. Vague bibasilar opacities likely reflect atelectasis. No consolidative airspace disease. No pleural effusions. No pneumothorax. No pulmonary  nodule or mass noted. Pulmonary vasculature and the cardiomediastinal silhouette are within normal limits. Atherosclerosis in the thoracic aorta. IMPRESSION: 1. Support apparatus and postoperative changes, as above. Tip of endotracheal tube is slightly low lying and could be withdrawn 1-2 cm for more optimal placement. 2. Low lung volumes with probable bibasilar subsegmental atelectasis. 3. Atherosclerosis. Electronically Signed   By: Vinnie Langton M.D.   On: 02/28/2016 16:56   Dg Abd Portable 1v  02/28/2016  CLINICAL DATA:  Feeding tube placement EXAM: PORTABLE ABDOMEN - 1 VIEW COMPARISON:  None. FINDINGS: NG tube is in place coiled in the fundus of the stomach. Nonobstructive bowel gas pattern. Probable bilateral renal stones. IMPRESSION: NG tube coils in the fundus of the stomach. Electronically Signed   By: Rolm Baptise M.D.   On: 02/28/2016 17:08    Microbiology: Recent Results (from the past 240 hour(s))  Culture, blood (Routine X 2) w Reflex to ID Panel     Status: None   Collection Time: 02/28/16  3:04 PM  Result Value Ref Range Status   Specimen Description BLOOD RIGHT ANTECUBITAL  Final   Special Requests IN PEDIATRIC BOTTLE .5CC   Final   Culture NO GROWTH 5 DAYS  Final   Report Status 03/04/2016 FINAL  Final  MRSA PCR Screening     Status: None   Collection Time: 02/28/16  5:30 PM  Result Value Ref Range Status   MRSA by PCR NEGATIVE NEGATIVE Final    Comment:        The GeneXpert MRSA Assay (FDA approved for NASAL specimens only), is one component of a comprehensive MRSA colonization surveillance program. It is not intended to diagnose MRSA infection nor to guide or monitor treatment for MRSA infections.   Culture, blood (Routine X 2) w Reflex to ID Panel     Status: None   Collection Time: 02/28/16  6:45 PM  Result Value Ref Range Status   Specimen Description   Final    BLOOD AS REPORTED BY THE CENTER FOR DISEASE CONTROL   Special Requests BOTTLES DRAWN AEROBIC AND ANAEROBIC 10CC  Final   Culture NO GROWTH 5 DAYS  Final   Report Status 03/04/2016 FINAL  Final  Respiratory virus panel     Status: Abnormal   Collection Time: 02/29/16  4:11 AM  Result Value Ref Range Status   Source - RVPAN NASAL SWAB  Corrected   Respiratory Syncytial Virus A Negative Negative Final   Respiratory Syncytial Virus B Negative Negative Final   Influenza A Negative Negative Final   Influenza B Negative Negative Final   Parainfluenza 1 Negative Negative Final   Parainfluenza 2 Negative Negative Final   Parainfluenza 3 Negative Negative Final   Metapneumovirus Negative Negative Final   Rhinovirus Positive (A) Negative Final   Adenovirus Negative Negative Final    Comment: (NOTE) Performed At: [facility] [place], [place] [id number] Lindon Romp MD [id number]      Labs: Basic Metabolic Panel:  Recent Labs Lab 02/28/16 1444 02/29/16 0505 03/01/16 0325 03/02/16 0440 03/02/16 1230 03/03/16 0952 03/04/16 0450 03/05/16 0552  NA  --  136 138 145 145 143 140 142  K  --  4.8 3.8 2.6* 3.3* 3.3* 3.5 3.4*  CL  --  104 107 107 105 107 104 104  CO2  --  19* 20* 27 27 26 27 26   GLUCOSE   --  237* 295* 240* 219* 247* 230* 163*  BUN  --  47* 44* 35* 30* 22* 17 12  CREATININE  --  2.59* 1.98* 1.49* 1.48* 1.20 1.22 0.99  CALCIUM  --  7.4* 7.4* 7.9* 8.1* 8.0* 8.2* 8.6*  MG 2.2 1.9 2.1 1.8  --   --   --   --   PHOS  --   --  2.5 1.6*  --   --  2.5  --    Liver Function Tests:  Recent Labs Lab 02/28/16 1845 02/29/16 0505 03/01/16 0325 03/03/16 1513 03/04/16 0450  AST 1563* 3759* 883* 193* 131*  ALT 1143* 1930* 1269* 565* 447*  ALKPHOS 127* 109 92 103 93  BILITOT 2.5* 2.0* 1.5* 1.4* 1.1  PROT 6.1* 5.1* 5.0* 5.7* 6.0*  ALBUMIN 2.7* 2.5* 2.2* 2.6* 2.6*   No results for input(s): LIPASE, AMYLASE in the last 168 hours.  Recent Labs Lab 02/28/16 1504 03/03/16 1513 03/05/16 0552  AMMONIA 178* 50* 47*   CBC:  Recent Labs Lab 02/27/16 1838 02/28/16 0404 02/29/16 0505 03/01/16 0325 03/02/16 1558 03/04/16 0450  WBC 22.6* 20.1* 12.8* 12.4*  --  12.0*  HGB 12.7* 11.7* 10.6* 11.1* 12.5* 11.6*  HCT 40.5 37.7* 34.2* 35.1* 39.4 38.0*  MCV 87.9 88.1 86.8 85.6  --  87.2  PLT 280 211 98* 123* 171 158   Cardiac Enzymes:  Recent Labs Lab 02/28/16 1008 02/28/16 1444 02/28/16 1845 03/01/16 1845  CKTOTAL  --   --   --  168  TROPONINI 0.18* 0.19* 0.33*  --    BNP: BNP (last 3 results)  Recent Labs  02/27/16 1204  BNP 1997.3*    ProBNP (last 3 results) No results for input(s): PROBNP in the last 8760 hours.  CBG:  Recent Labs Lab 03/04/16 1615 03/04/16 2018 03/05/16 0110 03/05/16 0517 03/05/16 0806  GLUCAP 247* 223* 174* 141* 123*       Signed:  Niel Hummer A MD.  [facility] 03/05/2016, 10:57 AM

## 2016-03-05 NOTE — Progress Notes (Signed)
Pt forget his RX notified Dr. Tyrell Antonio and instructed that it was ok to fax them over to pt's pharmacy.  Pt's wife made aware and she  gave number to pharmacy, [facility] in [place].  Rx faxed and spoke with Danelle in pharmacy who verified  that they received Rxs.  Karie Kirks, Therapist, sports.

## 2016-03-05 NOTE — Progress Notes (Signed)
Inpatient Diabetes Program Recommendations  AACE/ADA: New Consensus Statement on Inpatient Glycemic Control (2015)  Target Ranges:  Prepandial:   less than 140 mg/dL      Peak postprandial:   less than 180 mg/dL (1-2 hours)      Critically ill patients:  140 - 180 mg/dL   Review of Glycemic Control:   Results for RANSON, HOLTHAUS (MRN [id number]) as of 03/05/2016 10:48  Ref. Range 03/04/2016 11:30 03/04/2016 16:15 03/04/2016 20:18 03/05/2016 01:10 03/05/2016 05:17 03/05/2016 08:06  Glucose-Capillary Latest Ref Range: 65-99 mg/dL 298 (H) 247 (H) 223 (H) 174 (H) 141 (H) 123 (H)   Diabetes history: Type 2 diabetes Outpatient Diabetes medications: NPH 30 units q AM, NPH 15 units q PM Current orders for Inpatient glycemic control:  Novolog resistant q 4 hours  Inpatient Diabetes Program Recommendations:  Please change Novolog correction to moderate tid with meals and HS.  Also consider restarting home dose of NPH 30 units q AM and 15 units q HS.    Thanks, Adah Perl, RN, BC-ADM Inpatient Diabetes Coordinator Pager [phone] (8a-5p)

## 2016-03-08 DIAGNOSIS — E1142 Type 2 diabetes mellitus with diabetic polyneuropathy: Secondary | ICD-10-CM | POA: Diagnosis not present

## 2016-03-08 DIAGNOSIS — E1122 Type 2 diabetes mellitus with diabetic chronic kidney disease: Secondary | ICD-10-CM | POA: Diagnosis not present

## 2016-03-08 DIAGNOSIS — A419 Sepsis, unspecified organism: Secondary | ICD-10-CM | POA: Diagnosis not present

## 2016-03-08 DIAGNOSIS — J449 Chronic obstructive pulmonary disease, unspecified: Secondary | ICD-10-CM | POA: Diagnosis not present

## 2016-03-08 DIAGNOSIS — I509 Heart failure, unspecified: Secondary | ICD-10-CM | POA: Diagnosis not present

## 2016-03-08 DIAGNOSIS — Z8673 Personal history of transient ischemic attack (TIA), and cerebral infarction without residual deficits: Secondary | ICD-10-CM | POA: Diagnosis not present

## 2016-03-08 DIAGNOSIS — Z951 Presence of aortocoronary bypass graft: Secondary | ICD-10-CM | POA: Diagnosis not present

## 2016-03-08 DIAGNOSIS — N182 Chronic kidney disease, stage 2 (mild): Secondary | ICD-10-CM | POA: Diagnosis not present

## 2016-03-08 DIAGNOSIS — Z89512 Acquired absence of left leg below knee: Secondary | ICD-10-CM | POA: Diagnosis not present

## 2016-03-08 DIAGNOSIS — I130 Hypertensive heart and chronic kidney disease with heart failure and stage 1 through stage 4 chronic kidney disease, or unspecified chronic kidney disease: Secondary | ICD-10-CM | POA: Diagnosis not present

## 2016-03-09 DIAGNOSIS — E1122 Type 2 diabetes mellitus with diabetic chronic kidney disease: Secondary | ICD-10-CM | POA: Diagnosis not present

## 2016-03-09 DIAGNOSIS — Z951 Presence of aortocoronary bypass graft: Secondary | ICD-10-CM | POA: Diagnosis not present

## 2016-03-09 DIAGNOSIS — I130 Hypertensive heart and chronic kidney disease with heart failure and stage 1 through stage 4 chronic kidney disease, or unspecified chronic kidney disease: Secondary | ICD-10-CM | POA: Diagnosis not present

## 2016-03-09 DIAGNOSIS — Z89512 Acquired absence of left leg below knee: Secondary | ICD-10-CM | POA: Diagnosis not present

## 2016-03-09 DIAGNOSIS — N182 Chronic kidney disease, stage 2 (mild): Secondary | ICD-10-CM | POA: Diagnosis not present

## 2016-03-09 DIAGNOSIS — I509 Heart failure, unspecified: Secondary | ICD-10-CM | POA: Diagnosis not present

## 2016-03-09 DIAGNOSIS — J449 Chronic obstructive pulmonary disease, unspecified: Secondary | ICD-10-CM | POA: Diagnosis not present

## 2016-03-09 DIAGNOSIS — A419 Sepsis, unspecified organism: Secondary | ICD-10-CM | POA: Diagnosis not present

## 2016-03-09 DIAGNOSIS — E1142 Type 2 diabetes mellitus with diabetic polyneuropathy: Secondary | ICD-10-CM | POA: Diagnosis not present

## 2016-03-09 DIAGNOSIS — Z8673 Personal history of transient ischemic attack (TIA), and cerebral infarction without residual deficits: Secondary | ICD-10-CM | POA: Diagnosis not present

## 2016-03-10 ENCOUNTER — Ambulatory Visit: Payer: Self-pay | Admitting: Internal Medicine

## 2016-03-11 DIAGNOSIS — E1122 Type 2 diabetes mellitus with diabetic chronic kidney disease: Secondary | ICD-10-CM | POA: Diagnosis not present

## 2016-03-11 DIAGNOSIS — I130 Hypertensive heart and chronic kidney disease with heart failure and stage 1 through stage 4 chronic kidney disease, or unspecified chronic kidney disease: Secondary | ICD-10-CM | POA: Diagnosis not present

## 2016-03-11 DIAGNOSIS — I509 Heart failure, unspecified: Secondary | ICD-10-CM | POA: Diagnosis not present

## 2016-03-11 DIAGNOSIS — Z951 Presence of aortocoronary bypass graft: Secondary | ICD-10-CM | POA: Diagnosis not present

## 2016-03-11 DIAGNOSIS — A419 Sepsis, unspecified organism: Secondary | ICD-10-CM | POA: Diagnosis not present

## 2016-03-11 DIAGNOSIS — Z8673 Personal history of transient ischemic attack (TIA), and cerebral infarction without residual deficits: Secondary | ICD-10-CM | POA: Diagnosis not present

## 2016-03-11 DIAGNOSIS — E1142 Type 2 diabetes mellitus with diabetic polyneuropathy: Secondary | ICD-10-CM | POA: Diagnosis not present

## 2016-03-11 DIAGNOSIS — Z89512 Acquired absence of left leg below knee: Secondary | ICD-10-CM | POA: Diagnosis not present

## 2016-03-11 DIAGNOSIS — J449 Chronic obstructive pulmonary disease, unspecified: Secondary | ICD-10-CM | POA: Diagnosis not present

## 2016-03-11 DIAGNOSIS — N182 Chronic kidney disease, stage 2 (mild): Secondary | ICD-10-CM | POA: Diagnosis not present

## 2016-03-12 DIAGNOSIS — I130 Hypertensive heart and chronic kidney disease with heart failure and stage 1 through stage 4 chronic kidney disease, or unspecified chronic kidney disease: Secondary | ICD-10-CM | POA: Diagnosis not present

## 2016-03-12 DIAGNOSIS — E1122 Type 2 diabetes mellitus with diabetic chronic kidney disease: Secondary | ICD-10-CM | POA: Diagnosis not present

## 2016-03-12 DIAGNOSIS — A419 Sepsis, unspecified organism: Secondary | ICD-10-CM | POA: Diagnosis not present

## 2016-03-12 DIAGNOSIS — N182 Chronic kidney disease, stage 2 (mild): Secondary | ICD-10-CM | POA: Diagnosis not present

## 2016-03-12 DIAGNOSIS — I509 Heart failure, unspecified: Secondary | ICD-10-CM | POA: Diagnosis not present

## 2016-03-12 DIAGNOSIS — Z89512 Acquired absence of left leg below knee: Secondary | ICD-10-CM | POA: Diagnosis not present

## 2016-03-12 DIAGNOSIS — Z951 Presence of aortocoronary bypass graft: Secondary | ICD-10-CM | POA: Diagnosis not present

## 2016-03-12 DIAGNOSIS — E1142 Type 2 diabetes mellitus with diabetic polyneuropathy: Secondary | ICD-10-CM | POA: Diagnosis not present

## 2016-03-12 DIAGNOSIS — J449 Chronic obstructive pulmonary disease, unspecified: Secondary | ICD-10-CM | POA: Diagnosis not present

## 2016-03-12 DIAGNOSIS — Z8673 Personal history of transient ischemic attack (TIA), and cerebral infarction without residual deficits: Secondary | ICD-10-CM | POA: Diagnosis not present

## 2016-03-15 DIAGNOSIS — E1122 Type 2 diabetes mellitus with diabetic chronic kidney disease: Secondary | ICD-10-CM | POA: Diagnosis not present

## 2016-03-15 DIAGNOSIS — Z8673 Personal history of transient ischemic attack (TIA), and cerebral infarction without residual deficits: Secondary | ICD-10-CM | POA: Diagnosis not present

## 2016-03-15 DIAGNOSIS — Z951 Presence of aortocoronary bypass graft: Secondary | ICD-10-CM | POA: Diagnosis not present

## 2016-03-15 DIAGNOSIS — J449 Chronic obstructive pulmonary disease, unspecified: Secondary | ICD-10-CM | POA: Diagnosis not present

## 2016-03-15 DIAGNOSIS — E1142 Type 2 diabetes mellitus with diabetic polyneuropathy: Secondary | ICD-10-CM | POA: Diagnosis not present

## 2016-03-15 DIAGNOSIS — I130 Hypertensive heart and chronic kidney disease with heart failure and stage 1 through stage 4 chronic kidney disease, or unspecified chronic kidney disease: Secondary | ICD-10-CM | POA: Diagnosis not present

## 2016-03-15 DIAGNOSIS — I509 Heart failure, unspecified: Secondary | ICD-10-CM | POA: Diagnosis not present

## 2016-03-15 DIAGNOSIS — N182 Chronic kidney disease, stage 2 (mild): Secondary | ICD-10-CM | POA: Diagnosis not present

## 2016-03-15 DIAGNOSIS — Z89512 Acquired absence of left leg below knee: Secondary | ICD-10-CM | POA: Diagnosis not present

## 2016-03-15 DIAGNOSIS — A419 Sepsis, unspecified organism: Secondary | ICD-10-CM | POA: Diagnosis not present

## 2016-03-16 DIAGNOSIS — J449 Chronic obstructive pulmonary disease, unspecified: Secondary | ICD-10-CM | POA: Diagnosis not present

## 2016-03-16 DIAGNOSIS — I130 Hypertensive heart and chronic kidney disease with heart failure and stage 1 through stage 4 chronic kidney disease, or unspecified chronic kidney disease: Secondary | ICD-10-CM | POA: Diagnosis not present

## 2016-03-16 DIAGNOSIS — A419 Sepsis, unspecified organism: Secondary | ICD-10-CM | POA: Diagnosis not present

## 2016-03-16 DIAGNOSIS — E1142 Type 2 diabetes mellitus with diabetic polyneuropathy: Secondary | ICD-10-CM | POA: Diagnosis not present

## 2016-03-16 DIAGNOSIS — Z8673 Personal history of transient ischemic attack (TIA), and cerebral infarction without residual deficits: Secondary | ICD-10-CM | POA: Diagnosis not present

## 2016-03-16 DIAGNOSIS — I509 Heart failure, unspecified: Secondary | ICD-10-CM | POA: Diagnosis not present

## 2016-03-16 DIAGNOSIS — N182 Chronic kidney disease, stage 2 (mild): Secondary | ICD-10-CM | POA: Diagnosis not present

## 2016-03-16 DIAGNOSIS — Z951 Presence of aortocoronary bypass graft: Secondary | ICD-10-CM | POA: Diagnosis not present

## 2016-03-16 DIAGNOSIS — Z89512 Acquired absence of left leg below knee: Secondary | ICD-10-CM | POA: Diagnosis not present

## 2016-03-16 DIAGNOSIS — E1122 Type 2 diabetes mellitus with diabetic chronic kidney disease: Secondary | ICD-10-CM | POA: Diagnosis not present

## 2016-03-18 DIAGNOSIS — Z951 Presence of aortocoronary bypass graft: Secondary | ICD-10-CM | POA: Diagnosis not present

## 2016-03-18 DIAGNOSIS — Z8673 Personal history of transient ischemic attack (TIA), and cerebral infarction without residual deficits: Secondary | ICD-10-CM | POA: Diagnosis not present

## 2016-03-18 DIAGNOSIS — E1142 Type 2 diabetes mellitus with diabetic polyneuropathy: Secondary | ICD-10-CM | POA: Diagnosis not present

## 2016-03-18 DIAGNOSIS — E1122 Type 2 diabetes mellitus with diabetic chronic kidney disease: Secondary | ICD-10-CM | POA: Diagnosis not present

## 2016-03-18 DIAGNOSIS — Z89512 Acquired absence of left leg below knee: Secondary | ICD-10-CM | POA: Diagnosis not present

## 2016-03-18 DIAGNOSIS — I509 Heart failure, unspecified: Secondary | ICD-10-CM | POA: Diagnosis not present

## 2016-03-18 DIAGNOSIS — I130 Hypertensive heart and chronic kidney disease with heart failure and stage 1 through stage 4 chronic kidney disease, or unspecified chronic kidney disease: Secondary | ICD-10-CM | POA: Diagnosis not present

## 2016-03-18 DIAGNOSIS — J449 Chronic obstructive pulmonary disease, unspecified: Secondary | ICD-10-CM | POA: Diagnosis not present

## 2016-03-18 DIAGNOSIS — N182 Chronic kidney disease, stage 2 (mild): Secondary | ICD-10-CM | POA: Diagnosis not present

## 2016-03-18 DIAGNOSIS — A419 Sepsis, unspecified organism: Secondary | ICD-10-CM | POA: Diagnosis not present

## 2016-03-19 DIAGNOSIS — N182 Chronic kidney disease, stage 2 (mild): Secondary | ICD-10-CM | POA: Diagnosis not present

## 2016-03-19 DIAGNOSIS — Z951 Presence of aortocoronary bypass graft: Secondary | ICD-10-CM | POA: Diagnosis not present

## 2016-03-19 DIAGNOSIS — E1122 Type 2 diabetes mellitus with diabetic chronic kidney disease: Secondary | ICD-10-CM | POA: Diagnosis not present

## 2016-03-19 DIAGNOSIS — E1142 Type 2 diabetes mellitus with diabetic polyneuropathy: Secondary | ICD-10-CM | POA: Diagnosis not present

## 2016-03-19 DIAGNOSIS — I130 Hypertensive heart and chronic kidney disease with heart failure and stage 1 through stage 4 chronic kidney disease, or unspecified chronic kidney disease: Secondary | ICD-10-CM | POA: Diagnosis not present

## 2016-03-19 DIAGNOSIS — Z89512 Acquired absence of left leg below knee: Secondary | ICD-10-CM | POA: Diagnosis not present

## 2016-03-19 DIAGNOSIS — I509 Heart failure, unspecified: Secondary | ICD-10-CM | POA: Diagnosis not present

## 2016-03-19 DIAGNOSIS — J449 Chronic obstructive pulmonary disease, unspecified: Secondary | ICD-10-CM | POA: Diagnosis not present

## 2016-03-19 DIAGNOSIS — A419 Sepsis, unspecified organism: Secondary | ICD-10-CM | POA: Diagnosis not present

## 2016-03-19 DIAGNOSIS — Z8673 Personal history of transient ischemic attack (TIA), and cerebral infarction without residual deficits: Secondary | ICD-10-CM | POA: Diagnosis not present

## 2016-03-22 DIAGNOSIS — A419 Sepsis, unspecified organism: Secondary | ICD-10-CM | POA: Diagnosis not present

## 2016-03-22 DIAGNOSIS — E1122 Type 2 diabetes mellitus with diabetic chronic kidney disease: Secondary | ICD-10-CM | POA: Diagnosis not present

## 2016-03-22 DIAGNOSIS — I130 Hypertensive heart and chronic kidney disease with heart failure and stage 1 through stage 4 chronic kidney disease, or unspecified chronic kidney disease: Secondary | ICD-10-CM | POA: Diagnosis not present

## 2016-03-22 DIAGNOSIS — E1142 Type 2 diabetes mellitus with diabetic polyneuropathy: Secondary | ICD-10-CM | POA: Diagnosis not present

## 2016-03-22 DIAGNOSIS — Z89512 Acquired absence of left leg below knee: Secondary | ICD-10-CM | POA: Diagnosis not present

## 2016-03-22 DIAGNOSIS — I509 Heart failure, unspecified: Secondary | ICD-10-CM | POA: Diagnosis not present

## 2016-03-22 DIAGNOSIS — N182 Chronic kidney disease, stage 2 (mild): Secondary | ICD-10-CM | POA: Diagnosis not present

## 2016-03-22 DIAGNOSIS — J449 Chronic obstructive pulmonary disease, unspecified: Secondary | ICD-10-CM | POA: Diagnosis not present

## 2016-03-22 DIAGNOSIS — Z8673 Personal history of transient ischemic attack (TIA), and cerebral infarction without residual deficits: Secondary | ICD-10-CM | POA: Diagnosis not present

## 2016-03-22 DIAGNOSIS — Z951 Presence of aortocoronary bypass graft: Secondary | ICD-10-CM | POA: Diagnosis not present

## 2016-03-23 ENCOUNTER — Other Ambulatory Visit: Payer: Self-pay | Admitting: Physician Assistant

## 2016-03-23 DIAGNOSIS — E1122 Type 2 diabetes mellitus with diabetic chronic kidney disease: Secondary | ICD-10-CM | POA: Diagnosis not present

## 2016-03-23 DIAGNOSIS — Z89512 Acquired absence of left leg below knee: Secondary | ICD-10-CM | POA: Diagnosis not present

## 2016-03-23 DIAGNOSIS — N182 Chronic kidney disease, stage 2 (mild): Secondary | ICD-10-CM | POA: Diagnosis not present

## 2016-03-23 DIAGNOSIS — Z8673 Personal history of transient ischemic attack (TIA), and cerebral infarction without residual deficits: Secondary | ICD-10-CM | POA: Diagnosis not present

## 2016-03-23 DIAGNOSIS — A419 Sepsis, unspecified organism: Secondary | ICD-10-CM | POA: Diagnosis not present

## 2016-03-23 DIAGNOSIS — Z951 Presence of aortocoronary bypass graft: Secondary | ICD-10-CM | POA: Diagnosis not present

## 2016-03-23 DIAGNOSIS — E1142 Type 2 diabetes mellitus with diabetic polyneuropathy: Secondary | ICD-10-CM | POA: Diagnosis not present

## 2016-03-23 DIAGNOSIS — I509 Heart failure, unspecified: Secondary | ICD-10-CM | POA: Diagnosis not present

## 2016-03-23 DIAGNOSIS — J449 Chronic obstructive pulmonary disease, unspecified: Secondary | ICD-10-CM | POA: Diagnosis not present

## 2016-03-23 DIAGNOSIS — I130 Hypertensive heart and chronic kidney disease with heart failure and stage 1 through stage 4 chronic kidney disease, or unspecified chronic kidney disease: Secondary | ICD-10-CM | POA: Diagnosis not present

## 2016-03-24 DIAGNOSIS — E1142 Type 2 diabetes mellitus with diabetic polyneuropathy: Secondary | ICD-10-CM | POA: Diagnosis not present

## 2016-03-24 DIAGNOSIS — I509 Heart failure, unspecified: Secondary | ICD-10-CM | POA: Diagnosis not present

## 2016-03-24 DIAGNOSIS — I130 Hypertensive heart and chronic kidney disease with heart failure and stage 1 through stage 4 chronic kidney disease, or unspecified chronic kidney disease: Secondary | ICD-10-CM | POA: Diagnosis not present

## 2016-03-24 DIAGNOSIS — N182 Chronic kidney disease, stage 2 (mild): Secondary | ICD-10-CM | POA: Diagnosis not present

## 2016-03-24 DIAGNOSIS — Z8673 Personal history of transient ischemic attack (TIA), and cerebral infarction without residual deficits: Secondary | ICD-10-CM | POA: Diagnosis not present

## 2016-03-24 DIAGNOSIS — Z951 Presence of aortocoronary bypass graft: Secondary | ICD-10-CM | POA: Diagnosis not present

## 2016-03-24 DIAGNOSIS — A419 Sepsis, unspecified organism: Secondary | ICD-10-CM | POA: Diagnosis not present

## 2016-03-24 DIAGNOSIS — Z89512 Acquired absence of left leg below knee: Secondary | ICD-10-CM | POA: Diagnosis not present

## 2016-03-24 DIAGNOSIS — E1122 Type 2 diabetes mellitus with diabetic chronic kidney disease: Secondary | ICD-10-CM | POA: Diagnosis not present

## 2016-03-24 DIAGNOSIS — J449 Chronic obstructive pulmonary disease, unspecified: Secondary | ICD-10-CM | POA: Diagnosis not present

## 2016-03-26 DIAGNOSIS — N182 Chronic kidney disease, stage 2 (mild): Secondary | ICD-10-CM | POA: Diagnosis not present

## 2016-03-26 DIAGNOSIS — I509 Heart failure, unspecified: Secondary | ICD-10-CM | POA: Diagnosis not present

## 2016-03-26 DIAGNOSIS — A419 Sepsis, unspecified organism: Secondary | ICD-10-CM | POA: Diagnosis not present

## 2016-03-26 DIAGNOSIS — J449 Chronic obstructive pulmonary disease, unspecified: Secondary | ICD-10-CM | POA: Diagnosis not present

## 2016-03-26 DIAGNOSIS — E1142 Type 2 diabetes mellitus with diabetic polyneuropathy: Secondary | ICD-10-CM | POA: Diagnosis not present

## 2016-03-26 DIAGNOSIS — Z89512 Acquired absence of left leg below knee: Secondary | ICD-10-CM | POA: Diagnosis not present

## 2016-03-26 DIAGNOSIS — Z951 Presence of aortocoronary bypass graft: Secondary | ICD-10-CM | POA: Diagnosis not present

## 2016-03-26 DIAGNOSIS — E1122 Type 2 diabetes mellitus with diabetic chronic kidney disease: Secondary | ICD-10-CM | POA: Diagnosis not present

## 2016-03-26 DIAGNOSIS — I130 Hypertensive heart and chronic kidney disease with heart failure and stage 1 through stage 4 chronic kidney disease, or unspecified chronic kidney disease: Secondary | ICD-10-CM | POA: Diagnosis not present

## 2016-03-26 DIAGNOSIS — Z8673 Personal history of transient ischemic attack (TIA), and cerebral infarction without residual deficits: Secondary | ICD-10-CM | POA: Diagnosis not present

## 2016-03-29 DIAGNOSIS — Z8673 Personal history of transient ischemic attack (TIA), and cerebral infarction without residual deficits: Secondary | ICD-10-CM | POA: Diagnosis not present

## 2016-03-29 DIAGNOSIS — E1142 Type 2 diabetes mellitus with diabetic polyneuropathy: Secondary | ICD-10-CM | POA: Diagnosis not present

## 2016-03-29 DIAGNOSIS — E1122 Type 2 diabetes mellitus with diabetic chronic kidney disease: Secondary | ICD-10-CM | POA: Diagnosis not present

## 2016-03-29 DIAGNOSIS — J449 Chronic obstructive pulmonary disease, unspecified: Secondary | ICD-10-CM | POA: Diagnosis not present

## 2016-03-29 DIAGNOSIS — A419 Sepsis, unspecified organism: Secondary | ICD-10-CM | POA: Diagnosis not present

## 2016-03-29 DIAGNOSIS — I130 Hypertensive heart and chronic kidney disease with heart failure and stage 1 through stage 4 chronic kidney disease, or unspecified chronic kidney disease: Secondary | ICD-10-CM | POA: Diagnosis not present

## 2016-03-29 DIAGNOSIS — Z951 Presence of aortocoronary bypass graft: Secondary | ICD-10-CM | POA: Diagnosis not present

## 2016-03-29 DIAGNOSIS — I509 Heart failure, unspecified: Secondary | ICD-10-CM | POA: Diagnosis not present

## 2016-03-29 DIAGNOSIS — N182 Chronic kidney disease, stage 2 (mild): Secondary | ICD-10-CM | POA: Diagnosis not present

## 2016-03-29 DIAGNOSIS — Z89512 Acquired absence of left leg below knee: Secondary | ICD-10-CM | POA: Diagnosis not present

## 2016-03-30 ENCOUNTER — Encounter: Payer: Self-pay | Admitting: Internal Medicine

## 2016-03-30 ENCOUNTER — Ambulatory Visit (INDEPENDENT_AMBULATORY_CARE_PROVIDER_SITE_OTHER): Payer: Medicare Other | Admitting: Internal Medicine

## 2016-03-30 VITALS — BP 110/64 | HR 64 | Temp 97.6°F | Resp 16 | Ht 69.5 in | Wt 197.4 lb

## 2016-03-30 DIAGNOSIS — Z794 Long term (current) use of insulin: Secondary | ICD-10-CM | POA: Diagnosis not present

## 2016-03-30 DIAGNOSIS — Z79899 Other long term (current) drug therapy: Secondary | ICD-10-CM

## 2016-03-30 DIAGNOSIS — E1122 Type 2 diabetes mellitus with diabetic chronic kidney disease: Secondary | ICD-10-CM | POA: Diagnosis not present

## 2016-03-30 DIAGNOSIS — E782 Mixed hyperlipidemia: Secondary | ICD-10-CM

## 2016-03-30 DIAGNOSIS — N182 Chronic kidney disease, stage 2 (mild): Secondary | ICD-10-CM

## 2016-03-30 DIAGNOSIS — I10 Essential (primary) hypertension: Secondary | ICD-10-CM | POA: Diagnosis not present

## 2016-03-30 LAB — CBC WITH DIFFERENTIAL/PLATELET
BASOS ABS: 102 {cells}/uL (ref 0–200)
Basophils Relative: 1 %
EOS ABS: 204 {cells}/uL (ref 15–500)
Eosinophils Relative: 2 %
HEMATOCRIT: 43.5 % (ref 38.5–50.0)
Hemoglobin: 14.3 g/dL (ref 13.2–17.1)
LYMPHS PCT: 25 %
Lymphs Abs: 2550 cells/uL (ref 850–3900)
MCH: 27.3 pg (ref 27.0–33.0)
MCHC: 32.9 g/dL (ref 32.0–36.0)
MCV: 83.2 fL (ref 80.0–100.0)
MONOS PCT: 10 %
MPV: 10.4 fL (ref 7.5–12.5)
Monocytes Absolute: 1020 cells/uL — ABNORMAL HIGH (ref 200–950)
Neutro Abs: 6324 cells/uL (ref 1500–7800)
Neutrophils Relative %: 62 %
PLATELETS: 220 10*3/uL (ref 140–400)
RBC: 5.23 MIL/uL (ref 4.20–5.80)
RDW: 16.2 % — AB (ref 11.0–15.0)
WBC: 10.2 10*3/uL (ref 3.8–10.8)

## 2016-03-30 LAB — HEPATIC FUNCTION PANEL
ALBUMIN: 3.9 g/dL (ref 3.6–5.1)
ALT: 13 U/L (ref 9–46)
AST: 19 U/L (ref 10–35)
Alkaline Phosphatase: 54 U/L (ref 40–115)
BILIRUBIN TOTAL: 0.5 mg/dL (ref 0.2–1.2)
Bilirubin, Direct: 0.1 mg/dL (ref <=0.2)
Indirect Bilirubin: 0.4 mg/dL (ref 0.2–1.2)
TOTAL PROTEIN: 7.2 g/dL (ref 6.1–8.1)

## 2016-03-30 LAB — TSH: TSH: 2.85 mIU/L (ref 0.40–4.50)

## 2016-03-30 LAB — BASIC METABOLIC PANEL WITH GFR
BUN: 23 mg/dL (ref 7–25)
CHLORIDE: 98 mmol/L (ref 98–110)
CO2: 24 mmol/L (ref 20–31)
CREATININE: 1.15 mg/dL (ref 0.70–1.18)
Calcium: 9.2 mg/dL (ref 8.6–10.3)
GFR, Est African American: 73 mL/min (ref >=60)
GFR, Est Non African American: 63 mL/min (ref >=60)
GLUCOSE: 179 mg/dL — AB (ref 65–99)
Potassium: 4.3 mmol/L (ref 3.5–5.3)
Sodium: 133 mmol/L — ABNORMAL LOW (ref 135–146)

## 2016-03-30 LAB — MAGNESIUM: MAGNESIUM: 1.7 mg/dL (ref 1.5–2.5)
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
## 2016-03-30 NOTE — Patient Instructions (Signed)

## 2016-03-31 DIAGNOSIS — Z89512 Acquired absence of left leg below knee: Secondary | ICD-10-CM | POA: Diagnosis not present

## 2016-03-31 DIAGNOSIS — Z951 Presence of aortocoronary bypass graft: Secondary | ICD-10-CM | POA: Diagnosis not present

## 2016-03-31 DIAGNOSIS — I130 Hypertensive heart and chronic kidney disease with heart failure and stage 1 through stage 4 chronic kidney disease, or unspecified chronic kidney disease: Secondary | ICD-10-CM | POA: Diagnosis not present

## 2016-03-31 DIAGNOSIS — Z8673 Personal history of transient ischemic attack (TIA), and cerebral infarction without residual deficits: Secondary | ICD-10-CM | POA: Diagnosis not present

## 2016-03-31 DIAGNOSIS — E1122 Type 2 diabetes mellitus with diabetic chronic kidney disease: Secondary | ICD-10-CM | POA: Diagnosis not present

## 2016-03-31 DIAGNOSIS — I509 Heart failure, unspecified: Secondary | ICD-10-CM | POA: Diagnosis not present

## 2016-03-31 DIAGNOSIS — E1142 Type 2 diabetes mellitus with diabetic polyneuropathy: Secondary | ICD-10-CM | POA: Diagnosis not present

## 2016-03-31 DIAGNOSIS — N182 Chronic kidney disease, stage 2 (mild): Secondary | ICD-10-CM | POA: Diagnosis not present

## 2016-03-31 DIAGNOSIS — A419 Sepsis, unspecified organism: Secondary | ICD-10-CM | POA: Diagnosis not present

## 2016-03-31 DIAGNOSIS — J449 Chronic obstructive pulmonary disease, unspecified: Secondary | ICD-10-CM | POA: Diagnosis not present

## 2016-04-07 DIAGNOSIS — Z8673 Personal history of transient ischemic attack (TIA), and cerebral infarction without residual deficits: Secondary | ICD-10-CM | POA: Diagnosis not present

## 2016-04-07 DIAGNOSIS — J449 Chronic obstructive pulmonary disease, unspecified: Secondary | ICD-10-CM | POA: Diagnosis not present

## 2016-04-07 DIAGNOSIS — N182 Chronic kidney disease, stage 2 (mild): Secondary | ICD-10-CM | POA: Diagnosis not present

## 2016-04-07 DIAGNOSIS — Z89512 Acquired absence of left leg below knee: Secondary | ICD-10-CM | POA: Diagnosis not present

## 2016-04-07 DIAGNOSIS — Z951 Presence of aortocoronary bypass graft: Secondary | ICD-10-CM | POA: Diagnosis not present

## 2016-04-07 DIAGNOSIS — E1142 Type 2 diabetes mellitus with diabetic polyneuropathy: Secondary | ICD-10-CM | POA: Diagnosis not present

## 2016-04-07 DIAGNOSIS — E1122 Type 2 diabetes mellitus with diabetic chronic kidney disease: Secondary | ICD-10-CM | POA: Diagnosis not present

## 2016-04-07 DIAGNOSIS — I130 Hypertensive heart and chronic kidney disease with heart failure and stage 1 through stage 4 chronic kidney disease, or unspecified chronic kidney disease: Secondary | ICD-10-CM | POA: Diagnosis not present

## 2016-04-07 DIAGNOSIS — A419 Sepsis, unspecified organism: Secondary | ICD-10-CM | POA: Diagnosis not present

## 2016-04-07 DIAGNOSIS — I509 Heart failure, unspecified: Secondary | ICD-10-CM | POA: Diagnosis not present

## 2016-04-09 DIAGNOSIS — Z89512 Acquired absence of left leg below knee: Secondary | ICD-10-CM | POA: Diagnosis not present

## 2016-04-09 DIAGNOSIS — N182 Chronic kidney disease, stage 2 (mild): Secondary | ICD-10-CM | POA: Diagnosis not present

## 2016-04-09 DIAGNOSIS — Z8673 Personal history of transient ischemic attack (TIA), and cerebral infarction without residual deficits: Secondary | ICD-10-CM | POA: Diagnosis not present

## 2016-04-09 DIAGNOSIS — I509 Heart failure, unspecified: Secondary | ICD-10-CM | POA: Diagnosis not present

## 2016-04-09 DIAGNOSIS — I130 Hypertensive heart and chronic kidney disease with heart failure and stage 1 through stage 4 chronic kidney disease, or unspecified chronic kidney disease: Secondary | ICD-10-CM | POA: Diagnosis not present

## 2016-04-09 DIAGNOSIS — Z951 Presence of aortocoronary bypass graft: Secondary | ICD-10-CM | POA: Diagnosis not present

## 2016-04-09 DIAGNOSIS — E1122 Type 2 diabetes mellitus with diabetic chronic kidney disease: Secondary | ICD-10-CM | POA: Diagnosis not present

## 2016-04-09 DIAGNOSIS — A419 Sepsis, unspecified organism: Secondary | ICD-10-CM | POA: Diagnosis not present

## 2016-04-09 DIAGNOSIS — E1142 Type 2 diabetes mellitus with diabetic polyneuropathy: Secondary | ICD-10-CM | POA: Diagnosis not present

## 2016-04-09 DIAGNOSIS — J449 Chronic obstructive pulmonary disease, unspecified: Secondary | ICD-10-CM | POA: Diagnosis not present

## 2016-04-12 ENCOUNTER — Encounter: Payer: Self-pay | Admitting: Internal Medicine

## 2016-04-12 DIAGNOSIS — E119 Type 2 diabetes mellitus without complications: Secondary | ICD-10-CM | POA: Diagnosis not present

## 2016-04-22 ENCOUNTER — Ambulatory Visit (INDEPENDENT_AMBULATORY_CARE_PROVIDER_SITE_OTHER): Payer: Medicare Other | Admitting: Internal Medicine

## 2016-04-22 ENCOUNTER — Encounter: Payer: Self-pay | Admitting: Internal Medicine

## 2016-04-22 VITALS — BP 118/60 | HR 70 | Temp 98.2°F | Resp 18 | Ht 69.5 in | Wt 198.0 lb

## 2016-04-22 DIAGNOSIS — Z794 Long term (current) use of insulin: Secondary | ICD-10-CM

## 2016-04-22 DIAGNOSIS — I119 Hypertensive heart disease without heart failure: Secondary | ICD-10-CM | POA: Diagnosis not present

## 2016-04-22 DIAGNOSIS — E782 Mixed hyperlipidemia: Secondary | ICD-10-CM | POA: Diagnosis not present

## 2016-04-22 DIAGNOSIS — Z79899 Other long term (current) drug therapy: Secondary | ICD-10-CM

## 2016-04-22 DIAGNOSIS — N182 Chronic kidney disease, stage 2 (mild): Secondary | ICD-10-CM

## 2016-04-22 DIAGNOSIS — E1129 Type 2 diabetes mellitus with other diabetic kidney complication: Secondary | ICD-10-CM | POA: Diagnosis not present

## 2016-04-22 DIAGNOSIS — E559 Vitamin D deficiency, unspecified: Secondary | ICD-10-CM

## 2016-04-22 DIAGNOSIS — E1122 Type 2 diabetes mellitus with diabetic chronic kidney disease: Secondary | ICD-10-CM

## 2016-04-22 LAB — CBC WITH DIFFERENTIAL/PLATELET
BASOS ABS: 120 {cells}/uL (ref 0–200)
Basophils Relative: 1 %
EOS PCT: 2 %
Eosinophils Absolute: 240 cells/uL (ref 15–500)
HEMATOCRIT: 43.9 % (ref 38.5–50.0)
HEMOGLOBIN: 14.3 g/dL (ref 13.2–17.1)
LYMPHS ABS: 3600 {cells}/uL (ref 850–3900)
LYMPHS PCT: 30 %
MCH: 27.6 pg (ref 27.0–33.0)
MCHC: 32.6 g/dL (ref 32.0–36.0)
MCV: 84.6 fL (ref 80.0–100.0)
MONO ABS: 1080 {cells}/uL — AB (ref 200–950)
MPV: 10.4 fL (ref 7.5–12.5)
Monocytes Relative: 9 %
NEUTROS PCT: 58 %
Neutro Abs: 6960 cells/uL (ref 1500–7800)
Platelets: 267 10*3/uL (ref 140–400)
RBC: 5.19 MIL/uL (ref 4.20–5.80)
RDW: 16.9 % — AB (ref 11.0–15.0)
WBC: 12 10*3/uL — AB (ref 3.8–10.8)

## 2016-04-22 LAB — HEPATIC FUNCTION PANEL
ALBUMIN: 4.2 g/dL (ref 3.6–5.1)
ALT: 13 U/L (ref 9–46)
AST: 19 U/L (ref 10–35)
Alkaline Phosphatase: 53 U/L (ref 40–115)
Bilirubin, Direct: 0.1 mg/dL (ref <=0.2)
Indirect Bilirubin: 0.4 mg/dL (ref 0.2–1.2)
TOTAL PROTEIN: 7.2 g/dL (ref 6.1–8.1)
Total Bilirubin: 0.5 mg/dL (ref 0.2–1.2)

## 2016-04-22 LAB — BASIC METABOLIC PANEL WITH GFR
BUN: 19 mg/dL (ref 7–25)
CALCIUM: 9.6 mg/dL (ref 8.6–10.3)
CO2: 25 mmol/L (ref 20–31)
CREATININE: 1.35 mg/dL — AB (ref 0.70–1.18)
Chloride: 99 mmol/L (ref 98–110)
GFR, EST AFRICAN AMERICAN: 60 mL/min (ref >=60)
GFR, Est Non African American: 52 mL/min — ABNORMAL LOW (ref >=60)
GLUCOSE: 109 mg/dL — AB (ref 65–99)
Potassium: 4.2 mmol/L (ref 3.5–5.3)
Sodium: 136 mmol/L (ref 135–146)

## 2016-04-22 LAB — LIPID PANEL
CHOLESTEROL: 224 mg/dL — AB (ref 125–200)
HDL: 34 mg/dL — ABNORMAL LOW (ref >=40)
LDL Cholesterol: 144 mg/dL — ABNORMAL HIGH (ref <130)
TRIGLYCERIDES: 231 mg/dL — AB (ref <150)
Total CHOL/HDL Ratio: 6.6 Ratio — ABNORMAL HIGH (ref <=5.0)
VLDL: 46 mg/dL — ABNORMAL HIGH (ref <30)

## 2016-04-22 LAB — TSH: TSH: 3.01 m[IU]/L (ref 0.40–4.50)

## 2016-04-22 MED ORDER — BACLOFEN 10 MG PO TABS: 10.0000 mg | ORAL_TABLET | Freq: Every day | ORAL | Status: DC
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
## 2016-04-23 LAB — HEMOGLOBIN A1C
Hgb A1c MFr Bld: 9.6 % — ABNORMAL HIGH (ref <5.7)
Mean Plasma Glucose: 229 mg/dL

## 2016-04-28 ENCOUNTER — Other Ambulatory Visit: Payer: Self-pay | Admitting: Internal Medicine

## 2016-04-28 MED ORDER — BACLOFEN 10 MG PO TABS: 10.0000 mg | ORAL_TABLET | Freq: Every day | ORAL | Status: AC

## 2016-05-04 ENCOUNTER — Ambulatory Visit (INDEPENDENT_AMBULATORY_CARE_PROVIDER_SITE_OTHER): Payer: Medicare Other | Admitting: Internal Medicine

## 2016-05-04 ENCOUNTER — Encounter: Payer: Self-pay | Admitting: Internal Medicine

## 2016-05-04 VITALS — BP 102/66 | HR 76 | Temp 97.9°F | Resp 16 | Ht 69.5 in | Wt 196.6 lb

## 2016-05-04 DIAGNOSIS — E11621 Type 2 diabetes mellitus with foot ulcer: Secondary | ICD-10-CM | POA: Diagnosis not present

## 2016-05-04 DIAGNOSIS — L97509 Non-pressure chronic ulcer of other part of unspecified foot with unspecified severity: Secondary | ICD-10-CM | POA: Diagnosis not present

## 2016-05-06 ENCOUNTER — Other Ambulatory Visit: Payer: Self-pay | Admitting: *Deleted

## 2016-05-06 ENCOUNTER — Ambulatory Visit (INDEPENDENT_AMBULATORY_CARE_PROVIDER_SITE_OTHER): Payer: Medicare Other | Admitting: Vascular Surgery

## 2016-05-06 ENCOUNTER — Encounter: Payer: Self-pay | Admitting: Vascular Surgery

## 2016-05-06 ENCOUNTER — Ambulatory Visit (HOSPITAL_COMMUNITY)
Admission: RE | Admit: 2016-05-06 | Discharge: 2016-05-06 | Disposition: A | Discharge status: Home / Self Care | Payer: Medicare Other | Source: Ambulatory Visit | Attending: Vascular Surgery | Admitting: Vascular Surgery

## 2016-05-06 ENCOUNTER — Other Ambulatory Visit: Payer: Self-pay

## 2016-05-06 VITALS — BP 114/75 | HR 68 | Temp 98.5°F | Resp 18 | Ht 70.0 in | Wt 196.0 lb

## 2016-05-06 DIAGNOSIS — I7025 Atherosclerosis of native arteries of other extremities with ulceration: Secondary | ICD-10-CM | POA: Diagnosis not present

## 2016-05-06 DIAGNOSIS — R0989 Other specified symptoms and signs involving the circulatory and respiratory systems: Secondary | ICD-10-CM | POA: Insufficient documentation
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
## 2016-05-08 ENCOUNTER — Encounter: Payer: Self-pay | Admitting: Internal Medicine

## 2016-05-08 ENCOUNTER — Encounter: Payer: Self-pay | Admitting: *Deleted
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
## 2016-05-10 ENCOUNTER — Encounter: Payer: Self-pay | Admitting: Internal Medicine

## 2016-05-10 ENCOUNTER — Other Ambulatory Visit: Payer: Self-pay | Admitting: *Deleted

## 2016-05-10 ENCOUNTER — Encounter (HOSPITAL_COMMUNITY)
Admission: RE | Disposition: A | Discharge status: Home / Self Care | Payer: Self-pay | Source: Ambulatory Visit | Attending: Vascular Surgery

## 2016-05-10 ENCOUNTER — Ambulatory Visit (HOSPITAL_COMMUNITY)
Admission: RE | Admit: 2016-05-10 | Discharge: 2016-05-10 | Disposition: A | Discharge status: Home / Self Care | Payer: Medicare Other | Source: Ambulatory Visit | Attending: Vascular Surgery | Admitting: Vascular Surgery

## 2016-05-10 DIAGNOSIS — I252 Old myocardial infarction: Secondary | ICD-10-CM | POA: Insufficient documentation

## 2016-05-10 DIAGNOSIS — E7800 Pure hypercholesterolemia, unspecified: Secondary | ICD-10-CM | POA: Insufficient documentation

## 2016-05-10 DIAGNOSIS — Z951 Presence of aortocoronary bypass graft: Secondary | ICD-10-CM | POA: Insufficient documentation

## 2016-05-10 DIAGNOSIS — Z7982 Long term (current) use of aspirin: Secondary | ICD-10-CM | POA: Diagnosis not present

## 2016-05-10 DIAGNOSIS — L98499 Non-pressure chronic ulcer of skin of other sites with unspecified severity: Secondary | ICD-10-CM

## 2016-05-10 DIAGNOSIS — J449 Chronic obstructive pulmonary disease, unspecified: Secondary | ICD-10-CM | POA: Insufficient documentation

## 2016-05-10 DIAGNOSIS — Z89512 Acquired absence of left leg below knee: Secondary | ICD-10-CM | POA: Insufficient documentation

## 2016-05-10 DIAGNOSIS — L97519 Non-pressure chronic ulcer of other part of right foot with unspecified severity: Secondary | ICD-10-CM | POA: Diagnosis not present

## 2016-05-10 DIAGNOSIS — E559 Vitamin D deficiency, unspecified: Secondary | ICD-10-CM | POA: Diagnosis not present

## 2016-05-10 DIAGNOSIS — E1122 Type 2 diabetes mellitus with diabetic chronic kidney disease: Secondary | ICD-10-CM | POA: Diagnosis not present

## 2016-05-10 DIAGNOSIS — N182 Chronic kidney disease, stage 2 (mild): Secondary | ICD-10-CM | POA: Diagnosis not present

## 2016-05-10 DIAGNOSIS — I1310 Hypertensive heart and chronic kidney disease without heart failure, with stage 1 through stage 4 chronic kidney disease, or unspecified chronic kidney disease: Secondary | ICD-10-CM | POA: Diagnosis not present

## 2016-05-10 DIAGNOSIS — I2510 Atherosclerotic heart disease of native coronary artery without angina pectoris: Secondary | ICD-10-CM | POA: Diagnosis not present

## 2016-05-10 DIAGNOSIS — E11621 Type 2 diabetes mellitus with foot ulcer: Secondary | ICD-10-CM | POA: Insufficient documentation

## 2016-05-10 DIAGNOSIS — E669 Obesity, unspecified: Secondary | ICD-10-CM | POA: Diagnosis not present

## 2016-05-10 DIAGNOSIS — I7025 Atherosclerosis of native arteries of other extremities with ulceration: Secondary | ICD-10-CM | POA: Diagnosis present

## 2016-05-10 DIAGNOSIS — Z87891 Personal history of nicotine dependence: Secondary | ICD-10-CM | POA: Insufficient documentation

## 2016-05-10 DIAGNOSIS — Z6827 Body mass index (BMI) 27.0-27.9, adult: Secondary | ICD-10-CM | POA: Diagnosis not present

## 2016-05-10 DIAGNOSIS — Z79899 Other long term (current) drug therapy: Secondary | ICD-10-CM | POA: Insufficient documentation

## 2016-05-10 DIAGNOSIS — I70235 Atherosclerosis of native arteries of right leg with ulceration of other part of foot: Secondary | ICD-10-CM | POA: Insufficient documentation

## 2016-05-10 DIAGNOSIS — Z8673 Personal history of transient ischemic attack (TIA), and cerebral infarction without residual deficits: Secondary | ICD-10-CM | POA: Insufficient documentation

## 2016-05-10 DIAGNOSIS — E1140 Type 2 diabetes mellitus with diabetic neuropathy, unspecified: Secondary | ICD-10-CM | POA: Insufficient documentation

## 2016-05-10 DIAGNOSIS — Z794 Long term (current) use of insulin: Secondary | ICD-10-CM | POA: Insufficient documentation

## 2016-05-10 HISTORY — PX: LOWER EXTREMITY ANGIOGRAM: SHX5508

## 2016-05-10 HISTORY — PX: PERIPHERAL VASCULAR CATHETERIZATION: SHX172C

## 2016-05-10 LAB — POCT I-STAT, CHEM 8
BUN: 19 mg/dL (ref 6–20)
Calcium, Ion: 1.12 mmol/L — ABNORMAL LOW (ref 1.13–1.30)
Chloride: 100 mmol/L — ABNORMAL LOW (ref 101–111)
Creatinine, Ser: 1.1 mg/dL (ref 0.61–1.24)
Glucose, Bld: 153 mg/dL — ABNORMAL HIGH (ref 65–99)
HCT: 42 % (ref 39.0–52.0)
Hemoglobin: 14.3 g/dL (ref 13.0–17.0)
Potassium: 4.1 mmol/L (ref 3.5–5.1)
Sodium: 140 mmol/L (ref 135–145)
TCO2: 29 mmol/L (ref 0–100)

## 2016-05-10 LAB — GLUCOSE, CAPILLARY
Glucose-Capillary: 156 mg/dL — ABNORMAL HIGH (ref 65–99)
Glucose-Capillary: 113 mg/dL — ABNORMAL HIGH (ref 65–99)

## 2016-05-10 SURGERY — ABDOMINAL AORTOGRAM
Laterality: Right

## 2016-05-10 MED ORDER — SODIUM CHLORIDE 0.9 % IV SOLN
INTRAVENOUS | Status: DC
  Administered 2016-05-10: 08:00:00 via INTRAVENOUS

## 2016-05-10 MED ORDER — FENTANYL CITRATE (PF) 100 MCG/2ML IJ SOLN
INTRAMUSCULAR | Status: AC
  Filled 2016-05-10: qty 2

## 2016-05-10 MED ORDER — FENTANYL CITRATE (PF) 100 MCG/2ML IJ SOLN
INTRAMUSCULAR | Status: DC | PRN
  Administered 2016-05-10: 25 ug via INTRAVENOUS

## 2016-05-10 MED ORDER — HEPARIN (PORCINE) IN NACL 2-0.9 UNIT/ML-% IJ SOLN
INTRAMUSCULAR | Status: DC | PRN
  Administered 2016-05-10: 1000 mL

## 2016-05-10 MED ORDER — LIDOCAINE HCL (PF) 1 % IJ SOLN
INTRAMUSCULAR | Status: AC
  Filled 2016-05-10: qty 30

## 2016-05-10 MED ORDER — HYDRALAZINE HCL 20 MG/ML IJ SOLN: 10.0000 mg | INTRAMUSCULAR | Status: DC | PRN

## 2016-05-10 MED ORDER — OXYCODONE-ACETAMINOPHEN 5-325 MG PO TABS: 1.0000 | ORAL_TABLET | Freq: Four times a day (QID) | ORAL | Status: DC | PRN

## 2016-05-10 MED ORDER — MIDAZOLAM HCL 2 MG/2ML IJ SOLN
INTRAMUSCULAR | Status: AC
  Filled 2016-05-10: qty 2

## 2016-05-10 MED ORDER — ACETAMINOPHEN 325 MG PO TABS: 650.0000 mg | ORAL_TABLET | ORAL | Status: DC | PRN

## 2016-05-10 MED ORDER — SODIUM CHLORIDE 0.9 % IV SOLN: 1.0000 mL/kg/h | INTRAVENOUS | Status: DC

## 2016-05-10 MED ORDER — IODIXANOL 320 MG/ML IV SOLN
INTRAVENOUS | Status: DC | PRN
  Administered 2016-05-10: 100 mL via INTRAVENOUS

## 2016-05-10 MED ORDER — LIDOCAINE HCL (PF) 1 % IJ SOLN
INTRAMUSCULAR | Status: DC | PRN
  Administered 2016-05-10: 12 mL

## 2016-05-10 MED ORDER — MORPHINE SULFATE (PF) 2 MG/ML IV SOLN: 2.0000 mg | INTRAVENOUS | Status: DC | PRN

## 2016-05-10 MED ORDER — MIDAZOLAM HCL 2 MG/2ML IJ SOLN
INTRAMUSCULAR | Status: DC | PRN
  Administered 2016-05-10: 0.5 mg via INTRAVENOUS

## 2016-05-10 SURGICAL SUPPLY — 8 items
CATH OMNI FLUSH 5F 65CM (CATHETERS) ×2 IMPLANT
KIT MICROINTRODUCER STIFF 5F (SHEATH) ×2 IMPLANT
KIT PV (KITS) ×4 IMPLANT
SHEATH PINNACLE 5F 10CM (SHEATH) ×2 IMPLANT
SYR MEDRAD MARK V 150ML (SYRINGE) ×4 IMPLANT
TRANSDUCER W/STOPCOCK (MISCELLANEOUS) ×4 IMPLANT
TRAY PV CATH (CUSTOM PROCEDURE TRAY) ×4 IMPLANT
WIRE BENTSON .035X145CM (WIRE) ×2 IMPLANT

## 2016-05-10 NOTE — Progress Notes (Signed)
Up and walked and tol well and left groin stable no bleeding or hematoma 

## 2016-05-10 NOTE — Interval H&P Note (Signed)
History and Physical Interval Note:  05/10/2016 9:02 AM  Anthony Holder  has presented today for surgery, with the diagnosis of right foot ulcer  The various methods of treatment have been discussed with the patient and family. After consideration of risks, benefits and other options for treatment, the patient has consented to  Procedure(s): Lower Extremity Angiography - Right Leg (N/A) as a surgical intervention .  The patient's history has been reviewed, patient examined, no change in status, stable for surgery.  I have reviewed the patient's chart and labs.  Questions were answered to the patient's satisfaction.     Adele Barthel
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
## 2016-05-10 NOTE — Op Note (Signed)
OPERATIVE NOTE   PROCEDURE: 1.  left common femoral artery cannulation under ultrasound guidance 2.  Placement of catheter in aorta 3.  Aortogram 4.  Conscious sedation for 20 minutes 5.  Second order arterial selection 6.  Right leg runoff via catheter  PRE-OPERATIVE DIAGNOSIS: right foot ulcers  POST-OPERATIVE DIAGNOSIS: same as above   SURGEON: Adele Barthel, MD  ANESTHESIA: conscious sedation  ESTIMATED BLOOD LOSS: 50 cc  CONTRAST: 60 cc  FINDING(S):  Aorta: patent  Superior mesenteric artery: not well visualized Celiac artery: not well visualized   Right Left  RA patent patent  CIA patent patent  EIA patent patent  IIA patent patent  CFA patent patent  SFA Patent proximally, total occlusion in proximal 1/3, multiple serial stenoses 75-90% distally   PFA Patent, extensive collaterals   Pop Patent but diseased throughout: 75-90%   Trif Occluded   AT Collateral reconstitute AT which functions as relative rapid runoff to foot   Pero Occluded   PT Occluded, collaterals run adjacent to occluded PT    SPECIMEN(S):  none  INDICATIONS:   Anthony Holder is a 75 y.o. male who presents with right foot ulcers.  The patient presents for: right foot ulcers.  I discussed with the patient the nature of angiographic procedures, especially the limited patencies of any endovascular intervention.  The patient is aware of that the risks of an angiographic procedure include but are not limited to: bleeding, infection, access site complications, renal failure, embolization, rupture of vessel, dissection, possible need for emergent surgical intervention, possible need for surgical procedures to treat the patient's pathology, and stroke and death.  The patient is aware of the risks and agrees to proceed.  DESCRIPTION: After full informed consent was obtained from the patient, the patient was brought back to the angiography suite.  The patient was placed supine upon the angiography  table and connected to cardiopulmonary monitoring equipment.  The patient was then given conscious sedation, the amounts of which are documented in the patient's chart.  A circulating radiologic technician maintained continuous monitoring of the patient's cardiopulmonary status.  Additionally, the control room radiologic technician provided backup monitoring throughout the procedure.  The patient was prepped and drape in the standard fashion for an angiographic procedure.  At this point, attention was turned to the left groin.  Under ultrasound guidance, the subcutaneous tissue surrounding the left common femoral artery was anesthesized with 1% lidocaine with epinephrine.  The artery was then cannulated with a micropuncture needle.  The microwire was advanced into the iliac arterial system.  The needle was exchanged for a microsheath, which was loaded into the common femoral artery over the wire.  The microwire was exchanged for a [facility] wire which was advanced into the aorta.  The microsheath was then exchanged for a 5-Fr sheath which was loaded into the common femoral artery.  The Omniflush catheter was then loaded over the wire up to the level of L1.  The catheter was connected to the power injector circuit.  After de-airring and de-clotting the circuit, a power injector aortogram was completed.  The findings are listed above.  The [facility] wire was replaced in the catheter, and using the [place] and Omniflush, the right common iliac artery was selected.  The catheter and wire were advanced into the external iliac artery.  An automated runoff was completed via the catheter.  The findings are listed above.  Given the rapid filling of the anterior tibial artery which feeds the right foot,  I would start with wound care.  I suspect any endovascular intervention will be of limited patency, given the extensive length of disease.  I replaced the wire into the catheter, straightening out the crook in the catheter.  Both  were removed from the sheath together.  The sheath was aspirated.  No clots were present and the sheath was reloaded with heparinized saline.     COMPLICATIONS: none  CONDITION: stable   Adele Barthel, MD [facility] Office: [phone] Pager: [phone]  05/10/2016, 1:48 PM

## 2016-05-10 NOTE — Discharge Instructions (Signed)
DO NOT TAKE METFORMIN FOR 2 DAYS     Angiogram, Care After These instructions give you information about caring for yourself after your procedure. Your doctor may also give you more specific instructions. Call your doctor if you have any problems or questions after your procedure.  HOME CARE  Take medicines only as told by your doctor.  Follow your doctor's instructions about:  Care of the area where the tube was inserted.  Bandage (dressing) changes and removal.  You may shower 24-48 hours after the procedure or as told by your doctor.  Do not take baths, swim, or use a hot tub until your doctor approves.  Every day, check the area where the tube was inserted. Watch for:  Redness, swelling, or pain.  Fluid, blood, or pus.  Do not apply powder or lotion to the site.  Do not lift anything that is heavier than 10 lb (4.5 kg) for 5 days or as told by your doctor.  Ask your doctor when you can:  Return to work or school.  Do physical activities or play sports.  Have sex.  Do not drive or operate heavy machinery for 24 hours or as told by your doctor.  Have someone with you for the first 24 hours after the procedure.  Keep all follow-up visits as told by your doctor. This is important. GET HELP IF:  You have a fever.   You have chills.   You have more bleeding from the area where the tube was inserted. Hold pressure on the area.  You have redness, swelling, or pain in the area where the tube was inserted.  You have fluid or pus coming from the area. GET HELP RIGHT AWAY IF:   You have a lot of pain in the area where the tube was inserted.  The area where the tube was inserted is bleeding, and the bleeding does not stop after 30 minutes of holding steady pressure on the area.  The area near or just beyond the insertion site becomes pale, cool, tingly, or numb.   This information is not intended to replace advice given to you by your health care provider. Make  sure you discuss any questions you have with your health care provider.   Document Released: 03/04/2009 Document Revised: 12/27/2014 Document Reviewed: 05/09/2013 Elsevier Interactive Patient Education [employer].

## 2016-05-11 ENCOUNTER — Encounter (HOSPITAL_COMMUNITY): Payer: Self-pay | Admitting: Vascular Surgery

## 2016-05-11 ENCOUNTER — Telehealth: Payer: Self-pay | Admitting: Vascular Surgery

## 2016-05-11 NOTE — Telephone Encounter (Signed)
-----   Message from Mena Goes, RN sent at 05/10/2016  1:57 PM EDT ----- Regarding: schedule   ----- Message -----    From: Conrad Huron, MD    Sent: 05/10/2016   1:54 PM      To: [facility] Charge Pool  Anthony Holder PT:[id number] 09-Mar-1941  PROCEDURE: 1.  left common femoral artery cannulation under ultrasound guidance 2.  Placement of catheter in aorta 3.  Aortogram 4.  Conscious sedation for 20 minutes 5.  Second order arterial selection 6.  Right leg runoff via catheter  Follow-up: 1-2 months with [name] (originial attending)  Orders(s) for follow-up: Refer to [facility]

## 2016-05-11 NOTE — Telephone Encounter (Signed)
Sched wound care appt at [facility] on 6/7 at 12:30 with Dr. Con Memos. Sched [facility] appt 6/27 at 10:45. Spoke to pt to inform him of appts.

## 2016-05-20 DIAGNOSIS — I998 Other disorder of circulatory system: Secondary | ICD-10-CM

## 2016-05-20 HISTORY — DX: Other disorder of circulatory system: I99.8

## 2016-05-24 ENCOUNTER — Other Ambulatory Visit: Payer: Self-pay | Admitting: Surgery

## 2016-05-24 ENCOUNTER — Encounter: Payer: Medicare Other | Attending: Surgery | Admitting: Surgery

## 2016-05-24 ENCOUNTER — Ambulatory Visit
Admission: RE | Admit: 2016-05-24 | Discharge: 2016-05-24 | Disposition: A | Discharge status: Home / Self Care | Payer: Medicare Other | Source: Ambulatory Visit | Attending: Surgery | Admitting: Surgery

## 2016-05-24 DIAGNOSIS — E11621 Type 2 diabetes mellitus with foot ulcer: Secondary | ICD-10-CM | POA: Insufficient documentation

## 2016-05-24 DIAGNOSIS — B999 Unspecified infectious disease: Secondary | ICD-10-CM

## 2016-05-24 DIAGNOSIS — Z794 Long term (current) use of insulin: Secondary | ICD-10-CM | POA: Insufficient documentation

## 2016-05-24 DIAGNOSIS — Z8673 Personal history of transient ischemic attack (TIA), and cerebral infarction without residual deficits: Secondary | ICD-10-CM | POA: Insufficient documentation

## 2016-05-24 DIAGNOSIS — S81801A Unspecified open wound, right lower leg, initial encounter: Secondary | ICD-10-CM

## 2016-05-24 DIAGNOSIS — N182 Chronic kidney disease, stage 2 (mild): Secondary | ICD-10-CM | POA: Diagnosis not present

## 2016-05-24 DIAGNOSIS — L97511 Non-pressure chronic ulcer of other part of right foot limited to breakdown of skin: Secondary | ICD-10-CM | POA: Diagnosis not present

## 2016-05-24 DIAGNOSIS — E1140 Type 2 diabetes mellitus with diabetic neuropathy, unspecified: Secondary | ICD-10-CM | POA: Diagnosis not present

## 2016-05-24 DIAGNOSIS — Z89512 Acquired absence of left leg below knee: Secondary | ICD-10-CM | POA: Insufficient documentation

## 2016-05-24 DIAGNOSIS — E785 Hyperlipidemia, unspecified: Secondary | ICD-10-CM | POA: Diagnosis not present

## 2016-05-24 DIAGNOSIS — L97512 Non-pressure chronic ulcer of other part of right foot with fat layer exposed: Secondary | ICD-10-CM | POA: Diagnosis not present

## 2016-05-24 DIAGNOSIS — E669 Obesity, unspecified: Secondary | ICD-10-CM | POA: Insufficient documentation

## 2016-05-24 DIAGNOSIS — I2510 Atherosclerotic heart disease of native coronary artery without angina pectoris: Secondary | ICD-10-CM | POA: Insufficient documentation

## 2016-05-24 DIAGNOSIS — M19071 Primary osteoarthritis, right ankle and foot: Secondary | ICD-10-CM | POA: Diagnosis not present

## 2016-05-24 DIAGNOSIS — J449 Chronic obstructive pulmonary disease, unspecified: Secondary | ICD-10-CM | POA: Diagnosis not present

## 2016-05-24 DIAGNOSIS — E7800 Pure hypercholesterolemia, unspecified: Secondary | ICD-10-CM | POA: Insufficient documentation

## 2016-05-24 DIAGNOSIS — I70235 Atherosclerosis of native arteries of right leg with ulceration of other part of foot: Secondary | ICD-10-CM | POA: Insufficient documentation

## 2016-05-24 DIAGNOSIS — X58XXXA Exposure to other specified factors, initial encounter: Secondary | ICD-10-CM | POA: Insufficient documentation

## 2016-05-24 DIAGNOSIS — I350 Nonrheumatic aortic (valve) stenosis: Secondary | ICD-10-CM | POA: Diagnosis not present

## 2016-05-24 DIAGNOSIS — E1122 Type 2 diabetes mellitus with diabetic chronic kidney disease: Secondary | ICD-10-CM | POA: Diagnosis not present

## 2016-05-24 DIAGNOSIS — I130 Hypertensive heart and chronic kidney disease with heart failure and stage 1 through stage 4 chronic kidney disease, or unspecified chronic kidney disease: Secondary | ICD-10-CM | POA: Insufficient documentation

## 2016-05-24 DIAGNOSIS — S91101A Unspecified open wound of right great toe without damage to nail, initial encounter: Secondary | ICD-10-CM | POA: Diagnosis not present

## 2016-05-24 NOTE — Progress Notes (Addendum)
**Note Anthony Holder-Identified via Obfuscation** Anthony Holder, Anthony Holder ([id number]) Visit Report for 05/24/2016 Chief Complaint Document Details Patient Name: Anthony Holder, Anthony Holder Date of Service: 05/24/2016 1:30 PM Medical Record Number: [id number] Patient Account Number: [id number] Date of Birth/Sex: 04-23-1941 ([name]) Treating RN: [name] Primary Care Physician: [name] Other Clinician: Referring Physician: [name] Treating Physician/Extender: [name] in Treatment: 0 Information Obtained from: Patient Chief Complaint Patient presents to the wound care center for a consult due to nonhealing wounds of the right foot which is head for a few months now. Electronic Signature(s) Signed: 05/24/2016 3:24:55 PM By: Christin Fudge MD, FACS Entered By: Christin Fudge on 05/24/2016 15:24:54 Anthony Holder ([id number]) -------------------------------------------------------------------------------- Debridement Details Patient Name: Anthony Holder Date of Service: 05/24/2016 1:30 PM Medical Record Number: [id number] Patient Account Number: [id number] Date of Birth/Sex: 1941/12/11 ([name]) Treating RN: [name] Primary Care Physician: [name] Other Clinician: Referring Physician: [name] Treating Physician/Extender: [name] in Treatment: 0 Debridement Performed for Wound #1 Right,Medial Metatarsal head first Assessment: Performed By: Physician Christin Fudge, MD Debridement: Debridement Pre-procedure Yes Verification/Time Out Taken: Start Time: 14:29 Pain Control: Other : lidocaine4% Level: Skin/Subcutaneous Tissue Total Area Debrided (L x 2 (cm) x 5.2 (cm) = 10.4 (cm) W): Tissue and other Viable, Non-Viable, Fibrin/Slough, Skin material debrided: Instrument: Scissors Bleeding: None End Time: 14:32 Procedural Pain: 0 Post Procedural Pain: 0 Response to Treatment: Procedure was tolerated well Post Debridement Measurements of Total Wound Length: (cm) 2 Width: (cm)  5.2 Depth: (cm) 0.1 Volume: (cm) 0.817 Post Procedure Diagnosis Same as Pre-procedure Notes his right big toe nail was hanging by the cuticle and after getting permission from him I removed it easily with gentle manual ablation and cutting the cuticle which was attached. Electronic Signature(s) Signed: 05/24/2016 3:18:15 PM By: Christin Fudge MD, FACS Signed: 05/25/2016 4:33:07 PM By: [name] RN, [name], [name], [name] Entered By: Christin Fudge on 05/24/2016 15:18:14 Anthony Holder, Anthony Holder ([id number]) Anthony Holder, Anthony Holder ([id number]) -------------------------------------------------------------------------------- HPI Details Patient Name: Anthony Holder Date of Service: 05/24/2016 1:30 PM Medical Record Number: [id number] Patient Account Number: [id number] Date of Birth/Sex: [birth year] ([name]) Treating RN: [name] Primary Care Physician: [name] Other Clinician: Referring Physician: [name] Treating Physician/Extender: [name] in Treatment: 0 History of Present Illness Location: ulcerated area on the medial part of his first toe of the right foot with his right big toe nail coming off and having some cuts on the dorsum of his right foot Quality: Patient reports No Pain. Severity: Patient states wound are getting worse. Duration: Patient has had the wound for < 6 weeks prior to presenting for treatment Context: The wound appeared gradually over time Modifying Factors: Other treatment(s) tried include:had an angiogram of the right lower extremity recently Associated Signs and Symptoms: Patient reports having difficulty standing for long periods. HPI Description: 75 year old male who Has a past medical history significant for diabetes mellitus type 2, hypercholesterolemia, aortic stenosis, CVA, obesity, diabetic neuropathy, COPD, spinal stenosis, chronic kidney disease stage II, carotid artery disease and history of prior right carotid endarterectomy in 2010  at the time of his CABG. He is also status post cholecystectomy and left BKA in 2013 by Dr. Doran Holder. He was recently seen by the vascular surgeon Dr. Bridgett Larsson who did a procedure on him on 05/10/2016 where he had a aortogram and placement of a catheter and a right leg runoff. His reports were reviewed and the patient was found to have findings which showed filling of the anterior tibial artery. Endovascular  intervention was not going to be beneficial in view of his extensive length of disease and hence he was referred to wound care for further treatment. Note is made of multiple stenosis in his superficial femoral artery on the right side and patient had disease throughout his popliteal artery, the trifurcation was occluded and there were collaterals at the anterior tibial with a rapid runoff to the foot. The posterior tibial was occluded but there were collaterals adjacent. His past medical history is significant for hypertension, hyperlipidemia, coronary artery disease, peripheral vascular disease, status post left BKA. last hemoglobin A1c done a month ago was 9.6% Electronic Signature(s) Signed: 05/24/2016 3:25:03 PM By: Christin Fudge MD, FACS Previous Signature: 05/24/2016 3:15:41 PM Version By: Christin Fudge MD, FACS Previous Signature: 05/24/2016 1:54:02 PM Version By: Christin Fudge MD, FACS Entered By: Christin Fudge on 05/24/2016 15:25:03 Anthony Holder ([id number]) -------------------------------------------------------------------------------- Physical Exam Details Patient Name: Anthony Holder Date of Service: 05/24/2016 1:30 PM Medical Record Number: [id number] Patient Account Number: [id number] Date of Birth/Sex: 03-11-1941 ([name]) Treating RN: [name] Primary Care Physician: [name] Other Clinician: Referring Physician: [name] Treating Physician/Extender: [name] in Treatment: 0 Constitutional . Pulse regular. Respirations normal and unlabored.  Afebrile. . Eyes Nonicteric. Reactive to light. Ears, Nose, Mouth, and Throat Lips, teeth, and gums WNL.[name] Moist mucosa without lesions. Neck supple and nontender. No palpable supraclavicular or cervical adenopathy. Normal sized without goiter. Respiratory WNL. No retractions.. Breath sounds WNL, No rubs, rales, rhonchi, or wheeze.. Cardiovascular no palpable pulses and no ABI could be measured on the right foot. No clubbing, cyanosis or edema. Gastrointestinal (GI) Abdomen without masses or tenderness.. No liver or spleen enlargement or tenderness.. Lymphatic No adneopathy. No adenopathy. No adenopathy. Musculoskeletal Adexa without tenderness or enlargement.. Digits and nails w/o clubbing, cyanosis, infection, petechiae, ischemia, or inflammatory conditions.. Integumentary (Hair, Skin) No suspicious lesions. No crepitus or fluctuance. No peri-wound warmth or erythema. No masses.[name] Psychiatric Judgement and insight Intact.. No evidence of depression, anxiety, or agitation.. Notes the right foot has significant ulceration on the medial part of the first metatarsal head and dorsum of the foot. He also has the right nail completely coming off the nailbed. In the fourth webspace he is got a deep ulceration which has significant amount of slough at the base. He had a lot of dead necrotic skin and maceration and using forceps and scissors I was able to sharply remove some of this so as to prevent further infection. His right nail was also removed with ease and the cuticle at to be snipped off to separate the nail Electronic Signature(s) Signed: 05/24/2016 3:28:57 PM By: Christin Fudge MD, FACS Entered By: Christin Fudge on 05/24/2016 15:28:57 Anthony Holder, Anthony Holder ([id number]) Anthony Holder, Anthony Holder ([id number]) -------------------------------------------------------------------------------- Physician Orders Details Patient Name: Anthony Holder Date of Service: 05/24/2016 1:30 PM Medical Record Number:  [id number] Patient Account Number: [id number] Date of Birth/Sex: Sep 13, 1941 ([name]) Treating RN: [name] Primary Care Physician: [name] Other Clinician: Referring Physician: [name] Treating Physician/Extender: [name] in Treatment: 0 Verbal / Phone Orders: Yes Clinician: [name] Read Back and Verified: Yes Diagnosis Coding Wound Cleansing Wound #1 Right,Medial Metatarsal head first o Clean wound with Normal Saline. Wound #2 Right Toe - Web between 4th and 5th o Clean wound with Normal Saline. Wound #3 Right,Dorsal Foot o Clean wound with Normal Saline. Anesthetic Wound #1 Right,Medial Metatarsal head first o Topical Lidocaine 4% cream applied to wound bed prior to debridement Wound #2 Right  Toe - Web between 4th and 5th o Topical Lidocaine 4% cream applied to wound bed prior to debridement Wound #3 Right,Dorsal Foot o Topical Lidocaine 4% cream applied to wound bed prior to debridement Skin Barriers/Peri-Wound Care Wound #1 Right,Medial Metatarsal head first o Barrier cream Primary Wound Dressing Wound #1 Right,Medial Metatarsal head first o Aquacel Ag Wound #2 Right Toe - Web between 4th and 5th o Aquacel Ag Wound #3 Right,Dorsal Foot o Other: - betadine paint Secondary Dressing Wound #1 Right,Medial Metatarsal head first Anthony Holder, Anthony Holder ([id number]) o ABD and Kerlix/Conform Wound #2 Right Toe - Web between 4th and 5th o ABD and Kerlix/Conform Wound #3 Right,Dorsal Foot o ABD and Kerlix/Conform Dressing Change Frequency Wound #1 Right,Medial Metatarsal head first o Change dressing every other day. Wound #2 Right Toe - Web between 4th and 5th o Change dressing every other day. Follow-up Appointments Wound #1 Right,Medial Metatarsal head first o Return Appointment in 1 week. Wound #2 Right Toe - Web between 4th and 5th o Return Appointment in 1 week. Wound #3 Right,Dorsal Foot o Return  Appointment in 1 week. Additional Orders / Instructions Wound #1 Right,Medial Metatarsal head first o Increase protein intake. Wound #2 Right Toe - Web between 4th and 5th o Increase protein intake. Wound #3 Right,Dorsal Foot o Increase protein intake. Hyperbaric Oxygen Therapy Wound #1 Right,Medial Metatarsal head first o Evaluate for HBO Therapy Wound #2 Right Toe - Web between 4th and 5th o Evaluate for HBO Therapy Wound #3 Right,Dorsal Foot o Evaluate for HBO Therapy Radiology o X-ray, foot - Right foot Anthony Holder, Anthony Holder ([id number]) Electronic Signature(s) Signed: 05/24/2016 4:01:06 PM By: Christin Fudge MD, FACS Signed: 05/25/2016 4:33:07 PM By: [name] RN, [name], [name], [name] Entered By: [name], RN, [name], Kim on 05/24/2016 14:33:09 Anthony Holder ([id number]) -------------------------------------------------------------------------------- Problem List Details Patient Name: Anthony Holder Date of Service: 05/24/2016 1:30 PM Medical Record Number: [id number] Patient Account Number: [id number] Date of Birth/Sex: March 24, 1941 (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) Treating RN: [name] Primary Care Physician: [name] Other Clinician: Referring Physician: [name] Treating Physician/Extender: [name] in Treatment: 0 Subjective Chief Complaint Information obtained from Patient Patient presents to the wound care center for a consult due to nonhealing wounds of the right foot which is head for a few months now. History of Present Illness (HPI) The following HPI elements were documented for the patient's wound: Location: ulcerated area on the medial part of his first toe of the right foot with his right big toe nail coming off and having some cuts on the dorsum of his right foot Quality: Patient reports No Pain. Severity: Patient states wound are getting worse. Duration: Patient has had the wound  for < 6 weeks prior to presenting for treatment Context: The wound appeared gradually over time Modifying Factors: Other treatment(s) tried include:had an angiogram of the right lower extremity recently Associated Signs and Symptoms: Patient reports having difficulty standing for long periods. 75 year old male who Has a past medical history significant for diabetes mellitus type 2, hypercholesterolemia, aortic stenosis, CVA, obesity, diabetic neuropathy, COPD, spinal stenosis, chronic kidney disease stage II, carotid artery disease and history of prior right carotid endarterectomy in 2010 at the time of his CABG. He is also status post cholecystectomy and left BKA in 2013 by Dr. Doran Holder. He was recently seen by the vascular surgeon Dr. Bridgett Larsson who did a procedure on him on 05/10/2016 where he had a aortogram and placement of a catheter and a  right leg runoff. His reports were reviewed and the patient was found to have findings which showed filling of the anterior tibial artery. Endovascular intervention was not going to be beneficial in view of his extensive length of disease and hence he was referred to wound care for further treatment. Note is made of multiple stenosis in his superficial femoral artery on the right side and patient had disease throughout his popliteal artery, the trifurcation was occluded and there were collaterals at the anterior tibial with a rapid runoff to the foot. The posterior tibial was occluded but there were collaterals adjacent. His past medical history is significant for hypertension, hyperlipidemia, coronary artery disease, peripheral vascular disease, status post left BKA. last hemoglobin A1c done a month ago was 9.6% Wound History Patient presents with 3 open wounds that have been present for approximately 1 month. Patient has been treating wounds in the following manner: bag bond, honey, sugar. Laboratory tests have been performed in the last month. Patient reportedly has not tested positive for an antibiotic resistant organism. Patient reportedly has not tested positive for osteomyelitis. Patient reportedly has had testing performed to evaluate Anthony Holder, Anthony Holder ([id number]) circulation in the legs. Patient History Allergies No Known Allergies Social History Never smoker, Marital Status - Married, Alcohol Use - Never, Drug Use - No History, Caffeine Use - Never. Medical History Eyes Patient has history of Cataracts Denies history of Glaucoma, Optic Neuritis Ear/Nose/Mouth/Throat Denies history of Chronic sinus problems/congestion, Middle ear problems Hematologic/Lymphatic Denies history of Anemia, Hemophilia, Human Immunodeficiency Virus, Lymphedema Respiratory Denies history of Aspiration, Asthma, Chronic Obstructive Pulmonary Disease (COPD), Pneumothorax, Sleep Apnea,  Tuberculosis Cardiovascular Patient has history of Coronary Artery Disease, Deep Vein Thrombosis, Hypertension Denies history of Angina, Arrhythmia, Congestive Heart Failure, Hypotension, Myocardial Infarction, Peripheral Arterial Disease, Peripheral Venous Disease, Phlebitis, Vasculitis Gastrointestinal Denies history of Cirrhosis , Colitis, Hepatitis A, Hepatitis B, Hepatitis C Endocrine Patient has history of Type II Diabetes Denies history of Type I Diabetes Genitourinary Patient has history of End Stage Renal Disease Immunological Denies history of Lupus Erythematosus, Raynaud s, Scleroderma Integumentary (Skin) Denies history of History of Burn, History of pressure wounds Musculoskeletal Patient has history of Osteoarthritis Denies history of Gout, Rheumatoid Arthritis, Osteomyelitis Neurologic Patient has history of Neuropathy Denies history of Dementia, Quadriplegia, Paraplegia, Seizure Disorder Oncologic Denies history of Received Chemotherapy, Received Radiation Psychiatric Denies history of Anorexia/bulimia Patient is treated with Insulin. Blood sugar is tested. Blood sugar results noted at the following times: Breakfast - 231. Hospitalization/Surgery History - 02/18/2016, [facility], Aortic Stenosis. Anthony Holder, Anthony Holder ([id number]) Medical And Surgical History Notes Constitutional Symptoms (General Health) L BKA; DM; CVA; Aortic Stenosis; HTN; Spinal stenosis; CKD stage 2; Carotid Artery disease (5 bypass) Genitourinary Chronic Kidney  Disease stage 2 Review of Systems (ROS) Constitutional Symptoms (General Health) The patient has no complaints or symptoms. Eyes The patient has no complaints or symptoms. Ear/Nose/Mouth/Throat The patient has no complaints or symptoms. Hematologic/Lymphatic The patient has no complaints or symptoms. Respiratory The patient has no complaints or symptoms. Cardiovascular The patient has no complaints or symptoms. Gastrointestinal The  patient has no complaints or symptoms. Endocrine Denies complaints or symptoms of Hepatitis, Thyroid disease, Polydypsia (Excessive Thirst). Genitourinary The patient has no complaints or symptoms. Immunological The patient has no complaints or symptoms. Integumentary (Skin) Complains or has symptoms of Wounds. Denies complaints or symptoms of Bleeding or bruising tendency, Breakdown, Swelling. Musculoskeletal Denies complaints or symptoms of Muscle Pain, Muscle Weakness. Neurologic The patient has no complaints or symptoms. Oncologic The patient has no complaints or symptoms. Psychiatric The patient has no complaints or symptoms. Medications tramadol 50 mg tablet oral tablet oral every 6 hours as needed gabapentin 300 mg capsule oral capsule oral three times daily Probiotic 10 billion cell capsule oral capsule oral atenolol 25 mg tablet oral tablet oral Novolin N 100 unit/mL subcutaneous suspension subcutaneous suspension subcutaneous 30 units at breakfast and 15 with supper omega-3 fatty acids-fish oil 300 mg-1,000 mg capsule oral capsule oral furosemide 40 mg tablet oral tablet oral Anthony Holder, Anthony Holder ([id number]) Adult Low Dose Aspirin 81 mg tablet,delayed release oral tablet,delayed release (DR/EC) oral potassium chloride ER 20 mEq tablet,extended release oral tablet extended release oral baclofen 10 mg tablet oral tablet oral Nitrostat 0.4 mg sublingual tablet sublingual tablet, sublingual sublingual B Complex-Vitamin B12 tablet oral tablet oral Vitamin C 500 mg tablet oral tablet oral cholecalciferol (vitamin D3) 1,000 unit capsule oral capsule oral vitamin E 1,000 unit capsule oral capsule oral Objective Constitutional Pulse regular. Respirations normal and unlabored. Afebrile. Vitals Time Taken: 1:34 PM, Height: 70 in, Weight: 189 lbs, BMI: 27.1, Temperature: 98.0 F, Pulse: 76 bpm, Respiratory Rate: 18 breaths/min, Blood Pressure: 127/44 mmHg. Eyes Nonicteric.  Reactive to light. Ears, Nose, Mouth, and Throat Lips, teeth, and gums WNL.[name] Moist mucosa without lesions. Neck supple and nontender. No palpable supraclavicular or cervical adenopathy. Normal sized without goiter. Respiratory WNL. No retractions.. Breath sounds WNL, No rubs, rales, rhonchi, or wheeze.. Cardiovascular no palpable pulses and no ABI could be measured on the right foot. No clubbing, cyanosis or edema. Gastrointestinal (GI) Abdomen without masses or tenderness.. No liver or spleen enlargement or tenderness.. Lymphatic No adneopathy. No adenopathy. No adenopathy. Musculoskeletal Adexa without tenderness or enlargement.. Digits and nails w/o clubbing, cyanosis, infection, petechiae, ischemia, or inflammatory conditions.[name] Psychiatric Judgement and insight Intact.. No evidence of depression, anxiety, or agitation.[name] DELEON, SEMRAD ([id number]) General Notes: the right foot has significant ulceration on the medial part of the first metatarsal head and dorsum of the foot. He also has the right nail completely coming off the nailbed. In the fourth webspace he is got a deep ulceration which has significant amount of slough at the base. He had a lot of dead necrotic skin and maceration and using forceps and scissors I was able to sharply remove some of this so as to prevent further infection. His right nail was also removed with ease and the cuticle at to be snipped off to separate the nail Integumentary (Hair, Skin) No suspicious lesions. No crepitus or fluctuance. No peri-wound warmth or erythema. No masses.. Wound #1 status is Open. Original cause of wound was Gradually Appeared. The wound is located on the Right,Medial Metatarsal head first. The wound measures 2cm length x  5.2cm width x 0.1cm depth; 8.168cm^2 area and 0.817cm^3 volume. The wound is limited to skin breakdown. There is a none present amount of drainage noted. The wound margin is flat and intact. There is no  granulation within the wound bed. There is a large (67-100%) amount of necrotic tissue within the wound bed including Eschar. The periwound skin appearance exhibited: Maceration. The periwound skin appearance did not exhibit: Callus, Crepitus, Excoriation, Fluctuance, Friable, Induration, Localized Edema, Rash, Scarring, Dry/Scaly, Moist, Atrophie Blanche, Cyanosis, Ecchymosis, Hemosiderin Staining, Mottled, Pallor, Rubor, Erythema. Wound #2 status is Open. Original cause of wound was Gradually Appeared. The wound is located on the Right Toe - Web between 4th and 5th. The wound measures 1.2cm length x 1cm width x 0.1cm depth; 0.942cm^2 area and 0.094cm^3 volume. The wound is limited to skin breakdown. There is a medium amount of serous drainage noted. The wound margin is indistinct and nonvisible. There is no granulation within the wound bed. There is a large (67-100%) amount of necrotic tissue within the wound bed including Adherent Slough. The periwound skin appearance exhibited: Maceration. The periwound skin appearance did not exhibit: Callus, Crepitus, Excoriation, Fluctuance, Friable, Induration, Localized Edema, Rash, Scarring, Dry/Scaly, Moist, Atrophie Blanche, Cyanosis, Ecchymosis, Hemosiderin Staining, Mottled, Pallor, Rubor, Erythema. Wound #3 status is Open. Original cause of wound was Trauma. The wound is located on the Right,Dorsal Foot. The wound measures 2.7cm length x 1.2cm width x 0.1cm depth; 2.545cm^2 area and 0.254cm^3 volume. The wound is limited to skin breakdown. There is no tunneling or undermining noted. There is a none present amount of drainage noted. The wound margin is flat and intact. There is no granulation within the wound bed. There is a large (67-100%) amount of necrotic tissue within the wound bed including Adherent Slough. The periwound skin appearance did not exhibit: Callus, Crepitus, Excoriation, Fluctuance, Friable, Induration, Localized Edema, Rash,  Scarring, Dry/Scaly, Maceration, Moist, Atrophie Blanche, Cyanosis, Ecchymosis, Hemosiderin Staining, Mottled, Pallor, Rubor, Erythema. Assessment Active Problems ICD-10 E11.621 - Type 2 diabetes mellitus with foot ulcer I70.235 - Atherosclerosis of native arteries of right leg with ulceration of other part of foot L97.512 - Non-pressure chronic ulcer of other part of right foot with fat layer exposed Brizuela, Chozen ([id number]) [id number] - Acquired absence of left leg below knee This 75 year old gentleman with poorly controlled diabetes mellitus and significant peripheral vascular disease has ulceration of the right forefoot with significant rubor and poor vascularity of the forefoot. Though there is no overt abscess I suspect deep tissue infection and possible osteomyelitis and we will try to work this up appropriately to see if this is her Earleen Newport 3 ulceration He's had a recent angiogram which shows a good runoff towards the foot but I'm afraid this may not be good enough to heal the ulcers on the forefoot. At the present time I am going to recommend: 1. Silver alginate over the wounds on the forefoot including the web space of the fourth toe. 2. X-ray of the right foot and if there is any doubt we will proceed with an MRI 3. Continue to offload this foot as much as possible. 4. depending on the workup, if this is a Wagner 3 ulceration he may benefit from hyperbaric oxygen therapy and we will discuss this further with him when the time arises. In the meanwhile all pertinent discussions have been had with the patient and he fully understands the treatment plan. Procedures Wound #1 Wound #1 is a Diabetic Wound/Ulcer of the Lower Extremity located on  the Right,Medial Metatarsal head first . There was a Skin/Subcutaneous Tissue Debridement [id number]) debridement with total area of 10.4 sq cm performed by Christin Fudge, MD. with the following instrument(s): Scissors to remove Viable  and Non-Viable tissue/material including Fibrin/Slough and Skin after achieving pain control using Other (lidocaine4%). A time out was conducted prior to the start of the procedure. There was no bleeding. The procedure was tolerated well with a pain level of 0 throughout and a pain level of 0 following the procedure. Post Debridement Measurements: 2cm length x 5.2cm width x 0.1cm depth; 0.817cm^3 volume. Post procedure Diagnosis Wound #1: Same as Pre-Procedure General Notes: his right big toe nail was hanging by the cuticle and after getting permission from him I removed it easily with gentle manual ablation and cutting the cuticle which was attached.. Plan Wound Cleansing: Wound #1 Right,Medial Metatarsal head first: Clean wound with Normal Saline. Wound #2 Right Toe - Web between 4th and 5th: Clean wound with Normal Saline. HANNA, COMINS ([id number]) Wound #3 Right,Dorsal Foot: Clean wound with Normal Saline. Anesthetic: Wound #1 Right,Medial Metatarsal head first: Topical Lidocaine 4% cream applied to wound bed prior to debridement Wound #2 Right Toe - Web between 4th and 5th: Topical Lidocaine 4% cream applied to wound bed prior to debridement Wound #3 Right,Dorsal Foot: Topical Lidocaine 4% cream applied to wound bed prior to debridement Skin Barriers/Peri-Wound Care: Wound #1 Right,Medial Metatarsal head first: Barrier cream Primary Wound Dressing: Wound #1 Right,Medial Metatarsal head first: Aquacel Ag Wound #2 Right Toe - Web between 4th and 5th: Aquacel Ag Wound #3 Right,Dorsal Foot: Other: - betadine paint Secondary Dressing: Wound #1 Right,Medial Metatarsal head first: ABD and Kerlix/Conform Wound #2 Right Toe - Web between 4th and 5th: ABD and Kerlix/Conform Wound #3 Right,Dorsal Foot: ABD and Kerlix/Conform Dressing Change Frequency: Wound #1 Right,Medial Metatarsal head first: Change dressing every other day. Wound #2 Right Toe - Web between 4th and  5th: Change dressing every other day. Follow-up Appointments: Wound #1 Right,Medial Metatarsal head first: Return Appointment in 1 week. Wound #2 Right Toe - Web between 4th and 5th: Return Appointment in 1 week. Wound #3 Right,Dorsal Foot: Return Appointment in 1 week. Additional Orders / Instructions: Wound #1 Right,Medial Metatarsal head first: Increase protein intake. Wound #2 Right Toe - Web between 4th and 5th: Increase protein intake. Wound #3 Right,Dorsal Foot: Increase protein intake. Hyperbaric Oxygen Therapy: Wound #1 Right,Medial Metatarsal head first: Evaluate for HBO Therapy Wound #2 Right Toe - Web between 4th and 5th: Evaluate for HBO Therapy Wound #3 Right,Dorsal Foot: Anthony Holder ([id number]) Evaluate for HBO Therapy Radiology ordered were: X-ray, foot - Right foot This 75 year old gentleman with poorly controlled diabetes mellitus and significant peripheral vascular disease has ulceration of the right forefoot with significant rubor and poor vascularity of the forefoot. Though there is no overt abscess I suspect deep tissue infection and possible osteomyelitis and we will try to work this up appropriately to see if this is her Earleen [place] 3 ulceration He's had a recent angiogram which shows a good runoff towards the foot but I'm afraid this may not be good enough to heal the ulcers on the forefoot. At the present time I am going to recommend: 1. Silver alginate over the wounds on the forefoot including the web space of the fourth toe. 2. X-ray of the right foot and if there is any doubt we will proceed with an MRI 3. Continue to offload this foot as much as possible. 4. depending on  the workup, if this is a Wagner 3 ulceration he may benefit from hyperbaric oxygen therapy and we will discuss this further with him when the time arises. In the meanwhile all pertinent discussions have been had with the patient and he fully understands the treatment  plan. Electronic Signature(s) Signed: 05/24/2016 3:34:37 PM By: Christin Fudge MD, FACS Entered By: Christin Fudge on 05/24/2016 15:34:37 Anthony Holder ([id number]) -------------------------------------------------------------------------------- ROS/PFSH Details Patient Name: Anthony Holder Date of Service: 05/24/2016 1:30 PM Medical Record Number: [id number] Patient Account Number: [id number] Date of Birth/Sex: 1941/02/25 ([name]) Treating RN: [name] Primary Care Physician: [name] Other Clinician: Referring Physician: [name] Treating Physician/Extender: [name] in Treatment: 0 Wound History Do you currently have one or more open woundso Yes How many open wounds do you currently haveo 3 Approximately how long have you had your woundso 1 month How have you been treating your wound(s) until nowo bag bond, honey, sugar Has your wound(s) ever healed and then re-openedo No Have you had any lab work done in the past montho Yes Have you tested positive for an antibiotic resistant organism (MRSA, VRE)o No Have you tested positive for osteomyelitis (bone infection)o No Have you had any tests for circulation on your legso Yes Where was the test doneo [facility] Endocrine Complaints and Symptoms: Negative for: Hepatitis; Thyroid disease; Polydypsia (Excessive Thirst) Medical History: Positive for: Type II Diabetes Negative for: Type I Diabetes Time with diabetes: 25 years Treated with: Insulin Blood sugar tested every day: Yes Tested : evening Blood sugar testing results: Breakfast: 231 Integumentary (Skin) Complaints and Symptoms: Positive for: Wounds Negative for: Bleeding or bruising tendency; Breakdown; Swelling Medical History: Negative for: History of Burn; History of pressure wounds Musculoskeletal Complaints and Symptoms: Negative for: Muscle Pain; Muscle Weakness Medical History: Positive for: Osteoarthritis DURIEL, STRANGER  ([id number]) Negative for: Gout; Rheumatoid Arthritis; Osteomyelitis Constitutional Symptoms (General Health) Complaints and Symptoms: No Complaints or Symptoms Medical History: Past Medical History Notes: L BKA; DM; CVA; Aortic Stenosis; HTN; Spinal stenosis; CKD stage 2; Carotid Artery disease (5 bypass) Eyes Complaints and Symptoms: No Complaints or Symptoms Medical History: Positive for: Cataracts Negative for: Glaucoma; Optic Neuritis Ear/Nose/Mouth/Throat Complaints and Symptoms: No Complaints or Symptoms Medical History: Negative for: Chronic sinus problems/congestion; Middle ear problems Hematologic/Lymphatic Complaints and Symptoms: No Complaints or Symptoms Medical History: Negative for: Anemia; Hemophilia; Human Immunodeficiency Virus; Lymphedema Respiratory Complaints and Symptoms: No Complaints or Symptoms Medical History: Negative for: Aspiration; Asthma; Chronic Obstructive Pulmonary Disease (COPD); Pneumothorax; Sleep Apnea; Tuberculosis Cardiovascular Complaints and Symptoms: No Complaints or Symptoms Medical History: Positive for: Coronary Artery Disease; Deep Vein Thrombosis; Hypertension Watkinson, Torez ([id number]) Negative for: Angina; Arrhythmia; Congestive Heart Failure; Hypotension; Myocardial Infarction; Peripheral Arterial Disease; Peripheral Venous Disease; Phlebitis; Vasculitis Gastrointestinal Complaints and Symptoms: No Complaints or Symptoms Medical History: Negative for: Cirrhosis ; Colitis; Hepatitis A; Hepatitis B; Hepatitis C Genitourinary Complaints and Symptoms: No Complaints or Symptoms Medical History: Positive for: End Stage Renal Disease Past Medical History Notes: Chronic Kidney Disease stage 2 Immunological Complaints and Symptoms: No Complaints or Symptoms Medical History: Negative for: Lupus Erythematosus; Raynaudos; Scleroderma Neurologic Complaints and Symptoms: No Complaints or Symptoms Medical  History: Positive for: Neuropathy Negative for: Dementia; Quadriplegia; Paraplegia; Seizure Disorder Oncologic Complaints and Symptoms: No Complaints or Symptoms Medical History: Negative for: Received Chemotherapy; Received Radiation Psychiatric Complaints and Symptoms: No Complaints or Symptoms Medical HistoryUWE, KOIS ([id number]) Negative for: Anorexia/bulimia HBO Extended History Items Eyes: Cataracts Hospitalization / Surgery History Name of  Hospital Purpose of Hospitalization/Surgery Date [facility] Aortic Stenosis 02/18/2016 Family and Social History Never smoker; Marital Status - Married; Alcohol Use: Never; Drug Use: No History; Caffeine Use: Never; Advanced Directives: No; Patient does not want information on Advanced Directives; Living Will: No; Medical Power of Attorney: No Physician Affirmation I have reviewed and agree with the above information. Electronic Signature(s) Signed: 05/24/2016 2:14:52 PM By: Christin Fudge MD, FACS Signed: 05/25/2016 4:33:07 PM By: [name], RN, [name], [name], [name] Entered By: Christin Fudge on 05/24/2016 14:14:52 Anthony Holder ([id number]) -------------------------------------------------------------------------------- SuperBill Details Patient Name: Anthony Holder Date of Service: 05/24/2016 Medical Record Number: [id number] Patient Account Number: [id number] Date of Birth/Sex: [birth year] (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 Facility Procedures CPT4:  Description Modifier Quantity Code [id number] - WOUND CARE VISIT-LEV 3 EST PT 1 CPT4: [id number] - DEB SUBQ TISSUE 20 SQ CM/< 1 ICD-10 Description Diagnosis 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 of other part of foot L97.512 Non-pressure chronic  ulcer of other part of right foot with fat layer exposed Z89.512 Acquired absence of left leg below knee Physician Procedures CPT4: Description Modifier Quantity Code [id number] - WC PHYS LEVEL 4 - NEW PT 25 1 ICD-10 Description Diagnosis 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 of other part of  foot 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

## 2016-05-26 ENCOUNTER — Encounter (HOSPITAL_BASED_OUTPATIENT_CLINIC_OR_DEPARTMENT_OTHER): Payer: Medicare Other

## 2016-05-26 NOTE — Progress Notes (Signed)
[name], [name] ([id number]) Visit Report for 05/24/2016 Abuse/Suicide Risk Screen Details Patient Name: Anthony Holder, Anthony Holder Date of Service: 05/24/2016 1:30 PM Medical Record Number: [id number] Patient Account Number: [id number] Date of Birth/Sex: 1941-09-28 ([name]) Treating RN: [name] Primary Care Physician: [name] Other Clinician: Referring Physician: [name] Treating Physician/Extender: [name] in Treatment: 0 Abuse/Suicide Risk Screen Items Answer ABUSE/SUICIDE RISK SCREEN: Has anyone close to you tried to hurt or harm you recentlyo No Do you feel uncomfortable with anyone in your familyo No Has anyone forced you do things that you didnot want to doo No Do you have any thoughts of harming yourselfo No Patient displays signs or symptoms of abuse and/or neglect. No Electronic Signature(s) Signed: 05/25/2016 4:33:07 PM By: [name], RN, [name], [name], [name] Entered By: [name], RN, [name], Kim on 05/24/2016 13:56:25 Anthony Holder ([id number]) -------------------------------------------------------------------------------- Activities of Daily Living Details Patient Name: Anthony Holder Date of Service: 05/24/2016 1:30 PM Medical Record Number: [id number] Patient Account Number: [id number] Date of Birth/Sex: 03/31/1941 ([name]) Treating RN: [name] Primary Care Physician: [name] Other Clinician: Referring Physician: [name] Treating Physician/Extender: [name] in Treatment: 0 Activities of Daily Living Items Answer Activities of Daily Living (Please select one for each item) Drive Automobile Completely Able Take Medications Completely Able Use Telephone Completely Able Care for Appearance Completely Able Use Toilet Completely Able Bath / Shower Completely Able Dress Self Completely Able Feed Self Completely Able Walk Completely Able Get In / Out Bed Completely Able Housework Completely Able Prepare Meals  Completely [place] for Self Completely Able Electronic Signature(s) Signed: 05/25/2016 4:33:07 PM By: [name], RN, [name], [name], [name] Entered By: [name], RN, [name], Kim on 05/24/2016 13:56:55 Anthony Holder ([id number]) -------------------------------------------------------------------------------- Education Assessment Details Patient Name: Anthony Holder Date of Service: 05/24/2016 1:30 PM Medical Record Number: [id number] Patient Account Number: [id number] Date of Birth/Sex: May 01, 1941 ([name]) Treating RN: [name] Primary Care Physician: [name] Other Clinician: Referring Physician: [name] Treating Physician/Extender: [name] in Treatment: 0 Primary Learner Assessed: Patient Learning Preferences/Education Level/Primary Language Highest Education Level: High School Preferred Language: English Cognitive Barrier Assessment/Beliefs Language Barrier: No Translator Needed: No Memory Deficit: No Emotional Barrier: No Cultural/Religious Beliefs Affecting Medical No Care: Physical Barrier Assessment Impaired Vision: No Impaired Hearing: No Decreased Hand dexterity: No Knowledge/Comprehension Assessment Knowledge Level: High Comprehension Level: High Ability to understand written High instructions: Ability to understand verbal High instructions: Motivation Assessment Anxiety Level: Calm Cooperation: Cooperative Education Importance: Acknowledges Need Interest in Health Problems: Asks Questions Perception: Coherent Willingness to Engage in Self- High Management Activities: Readiness to Engage in Self- High Management Activities: Electronic Signature(s) Signed: 05/25/2016 4:33:07 PM By: [name], RN, [name], [name], [name] [name], [name] Entered By: [name], RN, [name], Kim on 05/24/2016 13:57:33 [name], [name]  ([id number]) -------------------------------------------------------------------------------- Fall Risk Assessment Details Patient Name: Anthony Holder Date of Service: 05/24/2016 1:30 PM Medical Record Number: [id number] Patient Account Number: [id number] Date of Birth/Sex: 15-Dec-1941 ([name]) Treating RN: [name] Primary Care Physician: [name] Other Clinician: Referring Physician: [name] Treating Physician/Extender: [name] in Treatment: 0 Fall Risk Assessment Items Have you had 2 or more falls in the last 12 monthso 0 No Have you had any fall that resulted in injury in the last 12 monthso 0 No FALL RISK ASSESSMENT: History of falling - immediate or within 3 months 25 Yes Secondary diagnosis 0 No Ambulatory aid None/bed rest/wheelchair/nurse 0 No  Crutches/cane/walker 15 Yes Furniture 0 No IV Access/Saline Lock 0 No Gait/Training Normal/bed rest/immobile 0 No Weak 10 Yes Impaired 0 No Mental Status Oriented to own ability 0 Yes Electronic Signature(s) Signed: 05/25/2016 4:33:07 PM By: [name], RN, [name], [name], [name] Entered By: [name], RN, [name], Kim on 05/24/2016 13:58:15 Anthony Holder ([id number]) -------------------------------------------------------------------------------- Foot Assessment Details Patient Name: Anthony Holder Date of Service: 05/24/2016 1:30 PM Medical Record Number: [id number] Patient Account Number: [id number] Date of Birth/Sex: [birth year] ([name]) Treating RN: [name] Primary Care Physician: [name] Other Clinician: Referring Physician: [name] Treating Physician/Extender: [name] in Treatment: 0 Foot Assessment Items Site Locations + = Sensation present, - = Sensation absent, C = Callus, U = Ulcer R = Redness, W = Warmth, M = Maceration, PU = Pre-ulcerative lesion F = Fissure, S = Swelling, D = Dryness Assessment Right: Left: Other Deformity: No No Prior Foot Ulcer: No  No Prior Amputation: No Yes Charcot Joint: No No Ambulatory Status: Ambulatory With Help Assistance Device: Cane Gait: Steady Electronic Signature(s) Signed: 05/25/2016 4:33:07 PM By: [name], RN, [name], [name], [name] Entered By: [name], RN, [name], Kim on 05/24/2016 13:58:59 Anthony Holder ([id number]) -------------------------------------------------------------------------------- Nutrition Risk Assessment Details Patient Name: Anthony Holder Date of Service: 05/24/2016 1:30 PM Medical Record Number: [id number] Patient Account Number: [id number] Date of Birth/Sex: [birth year] ([name]) Treating RN: [name] Primary Care Physician: [name] Other Clinician: Referring Physician: [name] Treating Physician/Extender: [name] in Treatment: 0 Height (in): 70 Weight (lbs): 189 Body Mass Index (BMI): 27.1 Nutrition Risk Assessment Items NUTRITION RISK SCREEN: I have an illness or condition that made me change the kind and/or 0 No amount of food I eat I eat fewer than two meals per day 0 No I eat few fruits and vegetables, or milk products 0 No I have three or more drinks of beer, liquor or wine almost every day 0 No I have tooth or mouth problems that make it hard for me to eat 0 No I don't always have enough money to buy the food I need 0 No I eat alone most of the time 0 No I take three or more different prescribed or over-the-counter drugs a 1 Yes day Without wanting to, I have lost or gained 10 pounds in the last six 0 No months I am not always physically able to shop, cook and/or feed myself 0 No Nutrition Protocols Good Risk Protocol 0 No interventions needed Moderate Risk Protocol Electronic Signature(s) Signed: 05/25/2016 4:33:07 PM By: [name], RN, [name], [name], [name] Entered By: [name], RN, [name], Kim on 05/24/2016 13:58:28

## 2016-05-26 NOTE — Progress Notes (Signed)
lidociane 4%, Treatment Notes Wound #3 (Right, Dorsal Foot) 1. Cleansed with: Clean wound with Normal Saline 3. Peri-wound Care: Other peri-wound care (specify in notes) Notes betadine Electronic Signature(s) Signed: 05/25/2016 4:33:07 PM By: [name], RN, [name], [name], [name] Entered By: [name], RN, [name], Kim on 05/24/2016 14:04:28 Anthony Holder  ([id number]) -------------------------------------------------------------------------------- [place] Details Patient Name: Anthony Holder Date of Service: 05/24/2016 1:30 PM Medical Record Number: [id number] Patient Account Number: [id number] Date of Birth/Sex: 1941/03/16 ([name]) Treating RN: [name] Primary Care Physician: [name] Other Clinician: Referring Physician: [name] Treating Physician/Extender: [name] in Treatment: 0 Vital Signs Time Taken: 13:34 Temperature (F): 98.0 Height (in): 70 Pulse (bpm): 76 Weight (lbs): 189 Respiratory Rate (breaths/min): 18 Body Mass Index (BMI): 27.1 Blood Pressure (mmHg): 127/44 Reference Range: 80 - 120 mg / dl Electronic Signature(s) Signed: 05/25/2016 4:33:07 PM By: [name], RN, [name], [name], [name] Entered By: [name], RN, [name], Kim on 05/24/2016 13:34:59  years: Yes Total Score: 85 Level Of Care: New/Established - Level 3 Electronic Signature(s) Signed: 05/25/2016 4:33:07 PM By: [name], RN, [name], [name], [name] Entered By: [name], RN, [name], Kim on 05/24/2016 14:49:45 Anthony Holder ([id number]) -------------------------------------------------------------------------------- Encounter Discharge Information Details Patient Name: Anthony Holder Date of Service: 05/24/2016 1:30 PM Medical Record Number: [id number] Patient Account Number: [id number] Date of Birth/Sex: 1941-09-19 ([name]) Treating RN: [name] Primary Care Physician: [name] Other Clinician: Referring Physician: [name] Treating Physician/Extender: [name] in Treatment: 0 Encounter Discharge Information Items Discharge Pain Level: 0 Discharge Condition: Stable Ambulatory Status: Cane Discharge Destination: Home Private Transportation: Auto Accompanied By: self Schedule Follow-up Appointment: Yes Medication Reconciliation completed and Yes provided to Patient/Care Dametra Whetsel: Clinical Summary of Care: Electronic Signature(s) Signed: 05/25/2016 4:33:07 PM By: [name], RN, [name], [name], [name] Entered By: [name], RN, [name], Kim on 05/24/2016 14:51:01 Anthony Holder ([id number]) -------------------------------------------------------------------------------- General Visit Notes Details Patient Name: Anthony Holder Date of Service: 05/24/2016 1:30 PM Medical Record Number: [id number] Patient Account Number: [id number] Date of Birth/Sex: 22-Sep-1941 ([name]) Treating RN: [name] Primary Care Physician: [name] Other Clinician: Referring Physician: [name] Treating Physician/Extender: [name] in Treatment: 0 Notes Patient's right great toenail was removed by the MD at today's visit. Electronic Signature(s) Signed: 05/25/2016 4:33:07 PM By: [name], RN, [name], [name], [name] Entered By: [name], RN, [name], Kim on 05/24/2016 16:07:34 Anthony Holder ([id number]) -------------------------------------------------------------------------------- Lower Extremity Assessment Details Patient Name: Anthony Holder Date of Service: 05/24/2016 1:30 PM Medical Record Number: [id number] Patient Account Number: [id number] Date of Birth/Sex: March 13, 1941 ([name]) Treating RN: [name] Primary Care Physician: [name] Other Clinician: Referring Physician: [name] Treating Physician/Extender: [name] in Treatment: 0 Edema Assessment Assessed: Shirlyn Goltz: No] [Right: No] Edema: [Left: N] [Right: o] Vascular Assessment Pulses: Posterior Tibial Palpable: [Right:No] Doppler: [Right:Inaudible] Dorsalis Pedis Palpable: [Right:No] Doppler: [Right:Inaudible] Extremity colors, hair growth, and conditions: Extremity Color: [Right:Pale] Hair Growth on Extremity: [Right:Yes] Temperature of Extremity: [Right:Cool] Capillary Refill: [Right:> 3 seconds] Toe Nail Assessment Left: Right: Thick: No Discolored: No Deformed: No Improper Length and Hygiene: No Notes Great toenail is barely hanging on. Electronic Signature(s) Signed: 05/25/2016 4:33:07 PM By: [name], RN, [name], [name], [name] Entered By: [name], RN, [name], Kim on 05/24/2016 13:48:26 Anthony Holder ([id number]) -------------------------------------------------------------------------------- Multi Wound Chart Details Patient Name: Anthony Holder Date of Service: 05/24/2016 1:30 PM Medical Record Number: [id number] Patient Account Number: [id number] Date of Birth/Sex: [birth year] (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] [2:the Lower Extremity] [3:the Lower Extremity] 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, Other] Comorbid History: [1:Cataracts, Coronary Artery Disease, Deep Vein Artery Disease, Deep Vein Artery Disease, Deep Vein Thrombosis, Hypertension, Type II Diabetes, End Stage Renal Disease, Osteoarthritis, Neuropathy Osteoarthritis, Neuropathy  Osteoarthritis, Neuropathy] [2:Cataracts, Coronary Thrombosis, Hypertension, Type II Diabetes, End Stage Renal Disease,] [3:Cataracts, Coronary Thrombosis, Hypertension, Type II Diabetes, End Stage Renal Disease,] Date Acquired: [1:05/10/2016] [2:05/10/2016] [3:05/10/2016] Weeks of Treatment: [1:0] [2:0] [3:0] Wound  Status: [1:Open] [2:Open] [3:Open] Pending Amputation on Yes [2:No] [3:No] Presentation: Measurements L x W x D 2x5.2x0.1 [2:1.2x1x0.1] [3:2.7x1.2x0.1] (cm) Area (cm) : [1:8.168] [2:0.942] [3:2.545] Volume (cm) : [1:0.817] [2:0.094] [3:0.254] % Reduction in Area: [1:0.00%] [2:N/A] [3:N/A] % Reduction in Volume: 0.00% [2:N/A] [3:N/A] Classification: [1:Unable to visualize wound Grade 2 bed] [3:Grade 1] Exudate Amount: [1:None Present] [2:Medium] [3:None Present] Exudate Type: [1:N/A] [2:Serous] [3:N/A] Exudate Color: [1:N/A] [2:amber] [3:N/A] Wound Margin: Flat and Intact Indistinct,  nonvisible Flat and Intact Granulation Amount: None Present (0%) None Present (0%) None Present (0%) Necrotic Amount: Large (67-100%) Large (67-100%) Large (67-100%) Necrotic Tissue: Eschar Adherent [place] Exposed Structures: Fascia: No Fascia: No Fascia: No Fat: No Fat: No Fat: No Tendon: No Tendon: No Tendon: No Muscle: No Muscle: No Muscle: No Joint: No Joint: No Joint: No Bone: No Bone: No Bone: No Limited to Skin Limited to Skin Limited to Skin Breakdown Breakdown Breakdown Epithelialization: None None None Periwound Skin Texture: Edema: No Edema: No Edema: No Excoriation: No Excoriation: No Excoriation: No Induration: No Induration: No Induration: No Callus: No Callus: No Callus: No Crepitus: No Crepitus: No Crepitus: No Fluctuance: No Fluctuance: No Fluctuance: No Friable: No Friable: No Friable: No Rash: No Rash: No Rash: No Scarring: No Scarring: No Scarring: No Periwound Skin Maceration: Yes Maceration: Yes Maceration: No Moisture: Moist: No Moist: No Moist: No Dry/Scaly: No Dry/Scaly: No Dry/Scaly: No Periwound Skin Color: Atrophie Blanche: No Atrophie Blanche: No Atrophie Blanche: No Cyanosis: No Cyanosis: No Cyanosis: No Ecchymosis: No Ecchymosis: No Ecchymosis: No Erythema: No Erythema: No Erythema: No Hemosiderin Staining: No Hemosiderin Staining: No Hemosiderin Staining: No Mottled: No Mottled: No Mottled: No Pallor: No Pallor: No Pallor: No Rubor: No Rubor: No Rubor: No Tenderness on No No No Palpation: Wound Preparation: Ulcer Cleansing: Ulcer Cleansing: Ulcer Cleansing: Rinsed/Irrigated with Rinsed/Irrigated with Rinsed/Irrigated with Saline Saline Saline Topical Anesthetic Topical Anesthetic Topical Anesthetic Applied: Other: Applied: Other: lidocaine Applied: Other: lidociane lidocaine4% 4% 4% Treatment Notes Electronic Signature(s) Signed: 05/25/2016 4:33:07 PM By: [name], RN, [name], Kim  RN, [name] Entered By: [name], RN, [name], Kim on 05/24/2016 14:19:36 SAUNDERS, HEINZMAN ([id number]) GRASYN, COSSE ([id number]) -------------------------------------------------------------------------------- Multi-Disciplinary Care Plan Details Patient Name: Anthony Holder Date of Service: 05/24/2016 1:30 PM Medical Record Number: [id number] Patient Account Number: [id number] Date of Birth/Sex: [birth year] ([name]) Treating RN: [name] Primary Care Physician: [name] Other Clinician: Referring Physician: [name] Treating Physician/Extender: [name] in Treatment: 0 Active Inactive Necrotic Tissue Nursing Diagnoses: Impaired tissue integrity related to necrotic/devitalized tissue Goals: Necrotic/devitalized tissue will be minimized in the wound bed Date Initiated: 05/24/2016 Goal Status: Active Interventions: Provide education on necrotic tissue and debridement process Treatment Activities: Apply topical anesthetic as ordered : 05/24/2016 Notes: Nutrition Nursing Diagnoses: Imbalanced nutrition Goals: Patient/caregiver agrees to and verbalizes understanding of need to obtain nutritional consultation Date Initiated: 05/24/2016 Goal Status: Active Interventions: Assess HgA1c results as ordered upon admission and as needed Treatment Activities: Obtain HgA1c : 05/24/2016 Notes: Orientation to the [facility] [place], [place] ([id number]) Nursing Diagnoses: Knowledge deficit related to the wound healing center program Goals: Patient/caregiver will verbalize understanding of the [facility] Date Initiated: 05/24/2016 Goal Status: Active Interventions: Provide education on orientation to the wound center Notes: Tissue Oxygenation Nursing Diagnoses: Actual ineffective tissue perfusion; peripheral (select once diagnosis is confirmed) Goals: Invasive arterial studies completed as ordered Date Initiated: 05/24/2016 Goal Status:  Active Interventions: Assess patient understanding of disease process and management upon diagnosis  MAISEN, PEARSALL ([id number]) Visit Report for 05/24/2016 Allergy List Details Patient Name: TRICE, SCHMOCK Date of Service: 05/24/2016 1:30 PM Medical Record Number: [id number] Patient Account Number: [id number] Date of Birth/Sex: 10/24/1941 ([name]) Treating RN: [name] Primary Care Physician: [name] Other Clinician: Referring Physician: [name] Treating Physician/Extender: [name] in Treatment: 0 Allergies Active Allergies No Known Allergies Allergy Notes Electronic Signature(s) Signed: 05/25/2016 4:33:07 PM By: [name], RN, [name], [name], [name] Entered By: [name], RN, [name], Kim on 05/24/2016 13:59:22 Anthony Holder ([id number]) -------------------------------------------------------------------------------- Arrival Information Details Patient Name: Anthony Holder Date of Service: 05/24/2016 1:30 PM Medical Record Number: [id number] Patient Account Number: [id number] Date of Birth/Sex: 1941-11-08 (76 y.o. Male) Treating RN: [name] Primary Care Physician: [name] Other Clinician: Referring Physician: [name] Treating Physician/Extender: [name] in Treatment: 0 Visit Information Patient Arrived: Ambulatory Arrival Time: 13:33 Accompanied By: self Transfer Assistance: None Patient Identification Verified: Yes Secondary Verification Process Yes Completed: Patient Has Alerts: Yes Patient Alerts: DM II Electronic Signature(s) Signed: 05/25/2016 4:33:07 PM By: [name], RN, [name], [name], [name] Entered By: [name], RN, [name], Kim on 05/24/2016 13:34:17 Anthony Holder ([id number]) -------------------------------------------------------------------------------- Clinic Level of Care Assessment Details Patient Name: Anthony Holder Date of Service: 05/24/2016 1:30 PM Medical Record Number: [id number] Patient Account Number: [id number] Date of Birth/Sex: 1941-10-04 ([name]) Treating RN: [name] Primary Care Physician:  [name] Other Clinician: Referring Physician: [name] Treating Physician/Extender: [name] in Treatment: 0 Clinic Level of Care Assessment Items TOOL 1 Quantity Score []  - Use when EandM and Procedure is performed on INITIAL visit 0 ASSESSMENTS - Nursing Assessment / Reassessment X - General Physical Exam (combine w/ comprehensive assessment (listed just 1 20 below) when performed on new pt. evals) X - Comprehensive Assessment (HX, ROS, Risk Assessments, Wounds Hx, etc.) 1 25 ASSESSMENTS - Wound and Skin Assessment / Reassessment []  - Dermatologic / Skin Assessment (not related to wound area) 0 ASSESSMENTS - Ostomy and/or Continence Assessment and Care []  - Incontinence Assessment and Management 0 []  - Ostomy Care Assessment and Management (repouching, etc.) 0 PROCESS - Coordination of Care X - Simple Patient / Family Education for ongoing care 1 15 []  - Complex (extensive) Patient / Family Education for ongoing care 0 X - Staff obtains Programmer, systems, Records, Test Results / Process Orders 1 10 []  - Staff telephones HHA, Nursing Homes / Clarify orders / etc 0 []  - Routine Transfer to another Facility (non-emergent condition) 0 []  - Routine Hospital Admission (non-emergent condition) 0 []  - New Admissions / Biomedical engineer / Ordering NPWT, Apligraf, etc. 0 []  - Emergency Hospital Admission (emergent condition) 0 PROCESS - Special Needs []  - Pediatric / Minor Patient Management 0 []  - Isolation Patient Management 0 Holaway, Danny ([id number]) []  - Hearing / Language / Visual special needs 0 []  - Assessment of Community assistance (transportation, D/C planning, etc.) 0 []  - Additional assistance / Altered mentation 0 []  - Support Surface(s) Assessment (bed, cushion, seat, etc.) 0 INTERVENTIONS - Miscellaneous []  - External ear exam 0 []  - Patient Transfer (multiple staff / Civil Service fast streamer / Similar devices) 0 []  - Simple Staple / Suture removal (25 or  less) 0 []  - Complex Staple / Suture removal (26 or more) 0 []  - Hypo/Hyperglycemic Management (do not check if billed separately) 0 X - Ankle / Brachial Index (ABI) - do not check if billed separately 1 15 Has the patient been seen at the hospital within the last three  MAISEN, PEARSALL ([id number]) Visit Report for 05/24/2016 Allergy List Details Patient Name: TRICE, SCHMOCK Date of Service: 05/24/2016 1:30 PM Medical Record Number: [id number] Patient Account Number: [id number] Date of Birth/Sex: 10/24/1941 ([name]) Treating RN: [name] Primary Care Physician: [name] Other Clinician: Referring Physician: [name] Treating Physician/Extender: [name] in Treatment: 0 Allergies Active Allergies No Known Allergies Allergy Notes Electronic Signature(s) Signed: 05/25/2016 4:33:07 PM By: [name], RN, [name], [name], [name] Entered By: [name], RN, [name], Kim on 05/24/2016 13:59:22 Anthony Holder ([id number]) -------------------------------------------------------------------------------- Arrival Information Details Patient Name: Anthony Holder Date of Service: 05/24/2016 1:30 PM Medical Record Number: [id number] Patient Account Number: [id number] Date of Birth/Sex: 1941-11-08 (76 y.o. Male) Treating RN: [name] Primary Care Physician: [name] Other Clinician: Referring Physician: [name] Treating Physician/Extender: [name] in Treatment: 0 Visit Information Patient Arrived: Ambulatory Arrival Time: 13:33 Accompanied By: self Transfer Assistance: None Patient Identification Verified: Yes Secondary Verification Process Yes Completed: Patient Has Alerts: Yes Patient Alerts: DM II Electronic Signature(s) Signed: 05/25/2016 4:33:07 PM By: [name], RN, [name], [name], [name] Entered By: [name], RN, [name], Kim on 05/24/2016 13:34:17 Anthony Holder ([id number]) -------------------------------------------------------------------------------- Clinic Level of Care Assessment Details Patient Name: Anthony Holder Date of Service: 05/24/2016 1:30 PM Medical Record Number: [id number] Patient Account Number: [id number] Date of Birth/Sex: 1941-10-04 ([name]) Treating RN: [name] Primary Care Physician:  [name] Other Clinician: Referring Physician: [name] Treating Physician/Extender: [name] in Treatment: 0 Clinic Level of Care Assessment Items TOOL 1 Quantity Score []  - Use when EandM and Procedure is performed on INITIAL visit 0 ASSESSMENTS - Nursing Assessment / Reassessment X - General Physical Exam (combine w/ comprehensive assessment (listed just 1 20 below) when performed on new pt. evals) X - Comprehensive Assessment (HX, ROS, Risk Assessments, Wounds Hx, etc.) 1 25 ASSESSMENTS - Wound and Skin Assessment / Reassessment []  - Dermatologic / Skin Assessment (not related to wound area) 0 ASSESSMENTS - Ostomy and/or Continence Assessment and Care []  - Incontinence Assessment and Management 0 []  - Ostomy Care Assessment and Management (repouching, etc.) 0 PROCESS - Coordination of Care X - Simple Patient / Family Education for ongoing care 1 15 []  - Complex (extensive) Patient / Family Education for ongoing care 0 X - Staff obtains Programmer, systems, Records, Test Results / Process Orders 1 10 []  - Staff telephones HHA, Nursing Homes / Clarify orders / etc 0 []  - Routine Transfer to another Facility (non-emergent condition) 0 []  - Routine Hospital Admission (non-emergent condition) 0 []  - New Admissions / Biomedical engineer / Ordering NPWT, Apligraf, etc. 0 []  - Emergency Hospital Admission (emergent condition) 0 PROCESS - Special Needs []  - Pediatric / Minor Patient Management 0 []  - Isolation Patient Management 0 Holaway, Danny ([id number]) []  - Hearing / Language / Visual special needs 0 []  - Assessment of Community assistance (transportation, D/C planning, etc.) 0 []  - Additional assistance / Altered mentation 0 []  - Support Surface(s) Assessment (bed, cushion, seat, etc.) 0 INTERVENTIONS - Miscellaneous []  - External ear exam 0 []  - Patient Transfer (multiple staff / Civil Service fast streamer / Similar devices) 0 []  - Simple Staple / Suture removal (25 or  less) 0 []  - Complex Staple / Suture removal (26 or more) 0 []  - Hypo/Hyperglycemic Management (do not check if billed separately) 0 X - Ankle / Brachial Index (ABI) - do not check if billed separately 1 15 Has the patient been seen at the hospital within the last three  Status: [1:Open] [2:Open] [3:Open] Pending Amputation on Yes [2:No] [3:No] Presentation: Measurements L x W x D 2x5.2x0.1 [2:1.2x1x0.1] [3:2.7x1.2x0.1] (cm) Area (cm) : [1:8.168] [2:0.942] [3:2.545] Volume (cm) : [1:0.817] [2:0.094] [3:0.254] % Reduction in Area: [1:0.00%] [2:N/A] [3:N/A] % Reduction in Volume: 0.00% [2:N/A] [3:N/A] Classification: [1:Unable to visualize wound Grade 2 bed] [3:Grade 1] Exudate Amount: [1:None Present] [2:Medium] [3:None Present] Exudate Type: [1:N/A] [2:Serous] [3:N/A] Exudate Color: [1:N/A] [2:amber] [3:N/A] Wound Margin: Flat and Intact Indistinct,  nonvisible Flat and Intact Granulation Amount: None Present (0%) None Present (0%) None Present (0%) Necrotic Amount: Large (67-100%) Large (67-100%) Large (67-100%) Necrotic Tissue: Eschar Adherent [place] Exposed Structures: Fascia: No Fascia: No Fascia: No Fat: No Fat: No Fat: No Tendon: No Tendon: No Tendon: No Muscle: No Muscle: No Muscle: No Joint: No Joint: No Joint: No Bone: No Bone: No Bone: No Limited to Skin Limited to Skin Limited to Skin Breakdown Breakdown Breakdown Epithelialization: None None None Periwound Skin Texture: Edema: No Edema: No Edema: No Excoriation: No Excoriation: No Excoriation: No Induration: No Induration: No Induration: No Callus: No Callus: No Callus: No Crepitus: No Crepitus: No Crepitus: No Fluctuance: No Fluctuance: No Fluctuance: No Friable: No Friable: No Friable: No Rash: No Rash: No Rash: No Scarring: No Scarring: No Scarring: No Periwound Skin Maceration: Yes Maceration: Yes Maceration: No Moisture: Moist: No Moist: No Moist: No Dry/Scaly: No Dry/Scaly: No Dry/Scaly: No Periwound Skin Color: Atrophie Blanche: No Atrophie Blanche: No Atrophie Blanche: No Cyanosis: No Cyanosis: No Cyanosis: No Ecchymosis: No Ecchymosis: No Ecchymosis: No Erythema: No Erythema: No Erythema: No Hemosiderin Staining: No Hemosiderin Staining: No Hemosiderin Staining: No Mottled: No Mottled: No Mottled: No Pallor: No Pallor: No Pallor: No Rubor: No Rubor: No Rubor: No Tenderness on No No No Palpation: Wound Preparation: Ulcer Cleansing: Ulcer Cleansing: Ulcer Cleansing: Rinsed/Irrigated with Rinsed/Irrigated with Rinsed/Irrigated with Saline Saline Saline Topical Anesthetic Topical Anesthetic Topical Anesthetic Applied: Other: Applied: Other: lidocaine Applied: Other: lidociane lidocaine4% 4% 4% Treatment Notes Electronic Signature(s) Signed: 05/25/2016 4:33:07 PM By: [name], RN, [name], Kim  RN, [name] Entered By: [name], RN, [name], Kim on 05/24/2016 14:19:36 SAUNDERS, HEINZMAN ([id number]) GRASYN, COSSE ([id number]) -------------------------------------------------------------------------------- Multi-Disciplinary Care Plan Details Patient Name: Anthony Holder Date of Service: 05/24/2016 1:30 PM Medical Record Number: [id number] Patient Account Number: [id number] Date of Birth/Sex: [birth year] ([name]) Treating RN: [name] Primary Care Physician: [name] Other Clinician: Referring Physician: [name] Treating Physician/Extender: [name] in Treatment: 0 Active Inactive Necrotic Tissue Nursing Diagnoses: Impaired tissue integrity related to necrotic/devitalized tissue Goals: Necrotic/devitalized tissue will be minimized in the wound bed Date Initiated: 05/24/2016 Goal Status: Active Interventions: Provide education on necrotic tissue and debridement process Treatment Activities: Apply topical anesthetic as ordered : 05/24/2016 Notes: Nutrition Nursing Diagnoses: Imbalanced nutrition Goals: Patient/caregiver agrees to and verbalizes understanding of need to obtain nutritional consultation Date Initiated: 05/24/2016 Goal Status: Active Interventions: Assess HgA1c results as ordered upon admission and as needed Treatment Activities: Obtain HgA1c : 05/24/2016 Notes: Orientation to the [facility], [place] ([id number]) Nursing Diagnoses: Knowledge deficit related to the wound healing center program Goals: Patient/caregiver will verbalize understanding of the [facility] Date Initiated: 05/24/2016 Goal Status: Active Interventions: Provide education on orientation to the wound center Notes: Tissue Oxygenation Nursing Diagnoses: Actual ineffective tissue perfusion; peripheral (select once diagnosis is confirmed) Goals: Invasive arterial studies completed as ordered Date Initiated: 05/24/2016 Goal Status:  Active Interventions: Assess patient understanding of disease process and management upon diagnosis

## 2016-05-27 DIAGNOSIS — L97519 Non-pressure chronic ulcer of other part of right foot with unspecified severity: Secondary | ICD-10-CM | POA: Diagnosis not present

## 2016-05-31 ENCOUNTER — Encounter: Payer: Medicare Other | Admitting: Surgery

## 2016-05-31 DIAGNOSIS — N182 Chronic kidney disease, stage 2 (mild): Secondary | ICD-10-CM | POA: Diagnosis not present

## 2016-05-31 DIAGNOSIS — E1140 Type 2 diabetes mellitus with diabetic neuropathy, unspecified: Secondary | ICD-10-CM | POA: Diagnosis not present

## 2016-05-31 DIAGNOSIS — L97512 Non-pressure chronic ulcer of other part of right foot with fat layer exposed: Secondary | ICD-10-CM | POA: Diagnosis not present

## 2016-05-31 DIAGNOSIS — J449 Chronic obstructive pulmonary disease, unspecified: Secondary | ICD-10-CM | POA: Diagnosis not present

## 2016-05-31 DIAGNOSIS — L97511 Non-pressure chronic ulcer of other part of right foot limited to breakdown of skin: Secondary | ICD-10-CM | POA: Diagnosis not present

## 2016-05-31 DIAGNOSIS — I70235 Atherosclerosis of native arteries of right leg with ulceration of other part of foot: Secondary | ICD-10-CM | POA: Diagnosis not present

## 2016-05-31 DIAGNOSIS — Z794 Long term (current) use of insulin: Secondary | ICD-10-CM | POA: Diagnosis not present

## 2016-05-31 DIAGNOSIS — E1122 Type 2 diabetes mellitus with diabetic chronic kidney disease: Secondary | ICD-10-CM | POA: Diagnosis not present

## 2016-05-31 DIAGNOSIS — E11621 Type 2 diabetes mellitus with foot ulcer: Secondary | ICD-10-CM | POA: Diagnosis not present

## 2016-05-31 DIAGNOSIS — I2510 Atherosclerotic heart disease of native coronary artery without angina pectoris: Secondary | ICD-10-CM | POA: Diagnosis not present

## 2016-05-31 DIAGNOSIS — E785 Hyperlipidemia, unspecified: Secondary | ICD-10-CM | POA: Diagnosis not present

## 2016-05-31 DIAGNOSIS — I350 Nonrheumatic aortic (valve) stenosis: Secondary | ICD-10-CM | POA: Diagnosis not present

## 2016-05-31 DIAGNOSIS — E7800 Pure hypercholesterolemia, unspecified: Secondary | ICD-10-CM | POA: Diagnosis not present

## 2016-05-31 DIAGNOSIS — I130 Hypertensive heart and chronic kidney disease with heart failure and stage 1 through stage 4 chronic kidney disease, or unspecified chronic kidney disease: Secondary | ICD-10-CM | POA: Diagnosis not present

## 2016-05-31 DIAGNOSIS — Z89512 Acquired absence of left leg below knee: Secondary | ICD-10-CM | POA: Diagnosis not present

## 2016-05-31 DIAGNOSIS — Z8673 Personal history of transient ischemic attack (TIA), and cerebral infarction without residual deficits: Secondary | ICD-10-CM | POA: Diagnosis not present

## 2016-05-31 NOTE — Progress Notes (Addendum)
Anthony Holder, DOELLING ([id number]) Visit Report for 05/31/2016 Chief Complaint Document Details Patient Name: Anthony Holder, Anthony Holder Date of Service: 05/31/2016 9:15 AM Medical Record Number: [id number] Patient Account Number: [id number] Date of Birth/Sex: [birth year] ([name]) Treating RN: Primary Care Physician: [name] Other Clinician: Referring Physician: [name] Treating Physician/Extender: [name] in Treatment: 1 Information Obtained from: Patient Chief Complaint Patient presents to the wound care center for a consult due to nonhealing wounds of the right foot which is head for a few months now. Electronic Signature(s) Signed: 05/31/2016 10:06:05 AM By: Christin Fudge MD, FACS Entered By: Christin Fudge on 05/31/2016 10:06:05 Anthony Holder ([id number]) -------------------------------------------------------------------------------- Debridement Details Patient Name: Anthony Holder Date of Service: 05/31/2016 9:15 AM Medical Record Number: [id number] Patient Account Number: [id number] Date of Birth/Sex: Jan 26, 1941 ([name]) Treating RN: Primary Care Physician: [name] Other Clinician: Referring Physician: [name] Treating Physician/Extender: [name] in Treatment: 1 Debridement Performed for Wound #2 Right Toe - Web between 4th and 5th Assessment: Performed By: Physician Christin Fudge, MD Debridement: Debridement Pre-procedure Yes Verification/Time Out Taken: Start Time: 09:40 Pain Control: Other : lidocaine 4% Level: Skin/Subcutaneous Tissue Total Area Debrided (L x 1.2 (cm) x 1 (cm) = 1.2 (cm) W): Tissue and other Viable, Non-Viable, Exudate, Fibrin/Slough, Skin, Subcutaneous material debrided: Instrument: Curette Bleeding: Large Hemostasis Achieved: Pressure End Time: 09:44 Procedural Pain: 0 Post Procedural Pain: 0 Response to Treatment: Procedure was tolerated well Post Debridement Measurements of  Total Wound Length: (cm) 1.2 Width: (cm) 1 Depth: (cm) 0.2 Volume: (cm) 0.188 Post Procedure Diagnosis Same as Pre-procedure Electronic Signature(s) Signed: 05/31/2016 10:05:57 AM By: Christin Fudge MD, FACS Entered By: Christin Fudge on 05/31/2016 10:05:56 Anthony Holder ([id number]) -------------------------------------------------------------------------------- HPI Details Patient Name: Anthony Holder Date of Service: 05/31/2016 9:15 AM Medical Record Number: [id number] Patient Account Number: [id number] Date of Birth/Sex: 1941-03-13 ([name]) Treating RN: Primary Care Physician: [name] Other Clinician: Referring Physician: [name] Treating Physician/Extender: [name] in Treatment: 1 History of Present Illness Location: ulcerated area on the medial part of his first toe of the right foot with his right big toe nail coming off and having some cuts on the dorsum of his right foot Quality: Patient reports No Pain. Severity: Patient states wound are getting worse. Duration: Patient has had the wound for < 6 weeks prior to presenting for treatment Context: The wound appeared gradually over time Modifying Factors: Other treatment(s) tried include:had an angiogram of the right lower extremity recently Associated Signs and Symptoms: Patient reports having difficulty standing for long periods. HPI Description: 75 year old male who has a past medical history significant for diabetes mellitus type 2, hypercholesterolemia, aortic stenosis, CVA, obesity, diabetic neuropathy, COPD, spinal stenosis, chronic kidney disease stage II, carotid artery disease and history of prior right carotid endarterectomy in 2010(at the time of his CABG). He is also status post cholecystectomy and left BKA in 2013 by Dr. Doran Durand. He was recently seen by the vascular surgeon Dr. Bridgett Larsson who did a procedure on him on 05/10/2016 where he had a aortogram and placement of a catheter  and a right leg runoff. His reports were reviewed and the patient was found to have findings which showed filling of the anterior tibial artery. Endovascular intervention was not going to be beneficial in view of his extensive length of disease and hence he was referred to wound care for further treatment. Note is made of multiple stenosis in his superficial femoral artery on the right side and patient  had disease throughout his popliteal artery, the trifurcation was occluded and there were collaterals at the anterior tibial with a rapid runoff to the foot. The posterior tibial was occluded but there were collaterals adjacent. His past medical history is significant for hypertension, hyperlipidemia, coronary artery disease, peripheral vascular disease, status post left BKA. last hemoglobin A1c done a month ago was 9.6% 05/31/2016 -- Right foot x-ray -- FINDINGS:Mild degenerative changes of the first MTP joint are noted. No acute fracture or dislocation is seen. Mild degenerative changes are noted in the tarsal bones as well. No gross soft tissue abnormality is seen. No bony erosion to suggest osteomyelitis is noted. IMPRESSION: Degenerative change without acute bony abnormality. Electronic Signature(s) Signed: 05/31/2016 10:07:20 AM By: Christin Fudge MD, FACS Previous Signature: 05/31/2016 9:32:13 AM Version By: Christin Fudge MD, FACS Entered By: Christin Fudge on 05/31/2016 10:07:20 Anthony Holder ([id number]) -------------------------------------------------------------------------------- Physical Exam Details Patient Name: Anthony Holder Date of Service: 05/31/2016 9:15 AM Medical Record Number: [id number] Patient Account Number: [id number] Date of Birth/Sex: 05-Apr-1941 ([name]) Treating RN: Primary Care Physician: [name] Other Clinician: Referring Physician: [name] Treating Physician/Extender: [name] in Treatment: 1 Constitutional . Pulse  regular. Respirations normal and unlabored. Afebrile. . Eyes Nonicteric. Reactive to light. Ears, Nose, Mouth, and Throat Lips, teeth, and gums WNL.[name] Moist mucosa without lesions. Neck supple and nontender. No palpable supraclavicular or cervical adenopathy. Normal sized without goiter. Respiratory WNL. No retractions.. Cardiovascular Pedal Pulses WNL. No clubbing, cyanosis or edema. Chest Breasts symmetical and no nipple discharge.. Breast tissue WNL, no masses, lumps, or tenderness.. Lymphatic No adneopathy. No adenopathy. No adenopathy. Musculoskeletal Adexa without tenderness or enlargement.. Digits and nails w/o clubbing, cyanosis, infection, petechiae, ischemia, or inflammatory conditions.. Integumentary (Hair, Skin) No suspicious lesions. No crepitus or fluctuance. No peri-wound warmth or erythema. No masses.[name] Psychiatric Judgement and insight Intact.. No evidence of depression, anxiety, or agitation.. Notes the area on the medial part of his first metatarsal now looks like a patch of dry gangrene and has demarcated well without evidence of surrounding cellulitis. The area on the right first toe where the nailbed was moist is now dry and looks good. In the fourth web space the ulceration has now also demarcated well and the lateral part of his right fourth toe shows a patch of dry gangrene. There is some maceration in the web space and I have been able to sharply remove some of the subcutaneous debris to make sure there is no pocket of pus. Electronic Signature(s) Signed: 05/31/2016 10:08:36 AM By: Christin Fudge MD, FACS Entered By: Christin Fudge on 05/31/2016 10:08:36 DEVEREAUX, STALLS ([id number]) MALEEK, BOUGHTON ([id number]) -------------------------------------------------------------------------------- Physician Orders Details Patient Name: Anthony Holder Date of Service: 05/31/2016 9:15 AM Medical Record Number: [id number] Patient Account Number: [id number] Date of  Birth/Sex: [birth year] ([name]) Treating RN: [name] Primary Care Physician: [name] Other Clinician: Referring Physician: [name] Treating Physician/Extender: [name] in Treatment: 1 Verbal / Phone Orders: Yes Clinician: [name] Read Back and Verified: Yes Diagnosis Coding Wound Cleansing Wound #1 Right,Medial Metatarsal head first o Clean wound with Normal Saline. Wound #2 Right Toe - Web between 4th and 5th o Clean wound with Normal Saline. Wound #3 Right,Dorsal Foot o Clean wound with Normal Saline. Anesthetic Wound #1 Right,Medial Metatarsal head first o Topical Lidocaine 4% cream applied to wound bed prior to debridement Wound #2 Right Toe - Web between 4th and 5th o Topical Lidocaine 4% cream applied to wound bed  prior to debridement Wound #3 Right,Dorsal Foot o Topical Lidocaine 4% cream applied to wound bed prior to debridement Skin Barriers/Peri-Wound Care Wound #1 Right,Medial Metatarsal head first o Barrier cream Primary Wound Dressing Wound #1 Right,Medial Metatarsal head first o Aquacel Ag o Other: - betadine paint Wound #2 Right Toe - Web between 4th and 5th o Aquacel Ag Wound #3 Right,Dorsal Foot o Other: - betadine paint Secondary Dressing Grimes, Zalyn ([id number]) Wound #1 Right,Medial Metatarsal head first o ABD and Kerlix/Conform Wound #2 Right Toe - Web between 4th and 5th o ABD and Kerlix/Conform Wound #3 Right,Dorsal Foot o ABD and Kerlix/Conform Dressing Change Frequency Wound #1 Right,Medial Metatarsal head first o Change dressing every other day. Wound #2 Right Toe - Web between 4th and 5th o Change dressing every other day. Follow-up Appointments Wound #1 Right,Medial Metatarsal head first o Return Appointment in 1 week. Wound #2 Right Toe - Web between 4th and 5th o Return Appointment in 1 week. Wound #3 Right,Dorsal Foot o Return Appointment in 1  week. Additional Orders / Instructions Wound #1 Right,Medial Metatarsal head first o Increase protein intake. Wound #2 Right Toe - Web between 4th and 5th o Increase protein intake. Wound #3 Right,Dorsal Foot o Increase protein intake. Hyperbaric Oxygen Therapy Wound #1 Right,Medial Metatarsal head first o Evaluate for HBO Therapy Wound #2 Right Toe - Web between 4th and 5th o Evaluate for HBO Therapy Wound #3 Right,Dorsal Foot o Evaluate for HBO Therapy Radiology AQUAN, BARNHARD ([id number]) o MRI, lower extremity with contast - Right Foot Electronic Signature(s) Signed: 05/31/2016 4:16:32 PM By: Christin Fudge MD, FACS Signed: 05/31/2016 5:25:15 PM By: [name], RN, [name], [name], [name] Entered By: [name], RN, [name], Kim on 05/31/2016 09:47:54 Anthony Holder ([id number]) -------------------------------------------------------------------------------- Problem List Details Patient Name: Anthony Holder Date of Service: 05/31/2016 9:15 AM Medical Record Number: [id number] Patient Account Number: [id number] Date of Birth/Sex: 1941-03-04 (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) Treating RN: Primary Care Physician: [name] Other Clinician: Referring Physician: [name] Treating Physician/Extender: [name] in Treatment: 1 Subjective Chief Complaint Information obtained from Patient Patient presents to the wound care center for a consult due to nonhealing wounds of the right foot which is head for a few months now. History of Present Illness (HPI) The following HPI elements were documented for the patient's wound: Location: ulcerated area on the medial part of his first toe of the right foot with his right big toe nail coming off and having some cuts on the dorsum of his right foot Quality: Patient reports No Pain. Severity: Patient states wound are getting worse. Duration: Patient has had the wound for < 6 weeks prior to presenting for treatment Context: The wound  appeared gradually over time Modifying Factors: Other treatment(s) tried include:had an angiogram of the right lower extremity recently Associated Signs and Symptoms: Patient reports having difficulty standing for long periods. 75 year old male who has a past medical history significant for diabetes mellitus type 2, hypercholesterolemia, aortic stenosis, CVA, obesity, diabetic neuropathy, COPD, spinal stenosis, chronic kidney disease stage II, carotid artery disease and history of prior right carotid endarterectomy in 2010(at the time of his CABG). He is also status post cholecystectomy and left BKA in 2013 by Dr. Doran Durand. He was recently seen by the vascular surgeon Dr. Bridgett Larsson who did a procedure on him on 05/10/2016 where he had a aortogram and placement of a catheter and a right leg  runoff. His reports were reviewed and the patient was found to have findings which showed filling of the anterior tibial artery. Endovascular intervention was not going to be beneficial in view of his extensive length of disease and hence he was referred to wound care for further treatment. Note is made of multiple stenosis in his superficial femoral artery on the right side and patient had disease throughout his popliteal artery, the trifurcation was occluded and there were collaterals at the anterior tibial with a rapid runoff to the foot. The posterior tibial was occluded but there were collaterals adjacent. His past medical history is significant for hypertension, hyperlipidemia, coronary artery disease, peripheral vascular disease, status post left BKA. last hemoglobin A1c done a month ago was 9.6% 05/31/2016 -- Right foot x-ray -- FINDINGS:Mild degenerative changes of the first MTP joint are noted. No acute fracture or dislocation is seen. Mild degenerative changes are noted in the tarsal bones as well. No gross soft tissue abnormality is seen. No bony erosion to suggest osteomyelitis is noted. IMPRESSION: Degenerative change without acute bony abnormality. [place], CLEO ([id number]) Objective Constitutional Pulse regular. Respirations normal and unlabored. Afebrile. Vitals Time Taken: 9:03 AM, Height: 70 in, Weight: 189 lbs, BMI: 27.1, Temperature: 98.2 F, Pulse: 71 bpm, Respiratory Rate: 18 breaths/min, Blood Pressure: 130/54 mmHg. Eyes Nonicteric. Reactive to light. Ears, Nose, Mouth, and Throat Lips, teeth, and gums WNL.[name] Moist mucosa without lesions. Neck supple and nontender. No palpable supraclavicular or cervical adenopathy. Normal sized without goiter. Respiratory WNL. No retractions.. Cardiovascular Pedal Pulses WNL. No clubbing, cyanosis or edema. Chest Breasts symmetical and no nipple discharge.. Breast tissue WNL, no masses, lumps, or  tenderness.. Lymphatic No adneopathy. No adenopathy. No adenopathy. Musculoskeletal Adexa without tenderness or enlargement.. Digits and nails w/o clubbing, cyanosis, infection, petechiae, ischemia, or inflammatory conditions.[name] Psychiatric Judgement and insight Intact.. No evidence of depression, anxiety, or agitation.. General Notes: the area on the medial part of his first metatarsal now looks like a patch of dry gangrene and has demarcated well without evidence of surrounding cellulitis. The area on the right first toe where the nailbed was moist is now dry and looks good. In the fourth web space the ulceration has now also demarcated well and the lateral part of his right fourth toe shows a patch of dry gangrene. There is some maceration in the web space and I have been able to sharply remove some of the subcutaneous debris to make sure there is no pocket of pus. DANIEL, TIENKEN ([id number]) Integumentary (Hair, Skin) No suspicious lesions. No crepitus or fluctuance. No peri-wound warmth or erythema. No masses.. Wound #1 status is Open. Original cause of wound was Gradually Appeared. The wound is located on the Right,Medial Metatarsal head first. The wound measures 3cm length x 4cm width x  0.1cm depth; 9.425cm^2 area and 0.942cm^3 volume. The wound is limited to skin breakdown. There is no tunneling or undermining noted. There is a none present amount of drainage noted. The wound margin is flat and intact. There is no granulation within the wound bed. There is a large (67-100%) amount of necrotic tissue within the wound bed including Eschar. The periwound skin appearance exhibited: Maceration. The periwound skin appearance did not exhibit: Callus, Crepitus, Excoriation, Fluctuance, Friable, Induration, Localized Edema, Rash, Scarring, Dry/Scaly, Moist, Atrophie Blanche, Cyanosis, Ecchymosis, Hemosiderin Staining, Mottled, Pallor, Rubor, Erythema. Wound #2 status is Open. Original cause  of wound was Gradually Appeared. The wound is located on the Right Toe - Web between 4th and 5th. The wound measures 1.2cm length x 1cm width x 0.1cm depth; 0.942cm^2 area and 0.094cm^3 volume. The wound is limited to skin breakdown. There is no tunneling or undermining noted. There is a large amount of serous drainage noted. The wound margin is indistinct and nonvisible. There is no granulation within the wound bed. There is a large (67-100%) amount of necrotic tissue within the wound bed including Eschar and Adherent Slough. The periwound skin appearance exhibited: Maceration. The periwound skin appearance did not exhibit: Callus, Crepitus, Excoriation, Fluctuance, Friable, Induration, Localized Edema, Rash, Scarring, Dry/Scaly, Moist, Atrophie Blanche, Cyanosis, Ecchymosis, Hemosiderin Staining, Mottled, Pallor, Rubor, Erythema. Wound #3 status is Open. Original cause of wound was Trauma. The wound is located on the Right,Dorsal Foot. The wound measures 2.5cm length x 1.4cm width x 0.1cm depth; 2.749cm^2 area and 0.275cm^3 volume. The wound is limited to skin breakdown. There is no tunneling or undermining noted. There is a small amount of serous drainage noted. The wound margin is flat and intact. There is no granulation within the wound bed. There is a large (67-100%) amount of necrotic tissue within the wound bed including Adherent Slough. The periwound skin appearance did not exhibit: Callus, Crepitus, Excoriation, Fluctuance, Friable, Induration, Localized Edema, Rash, Scarring, Dry/Scaly, Maceration, Moist, Atrophie Blanche, Cyanosis, Ecchymosis, Hemosiderin Staining, Mottled, Pallor, Rubor, Erythema. Review of his electronic medical records showed that in March of this year he had a left ventricle ejection fraction of 25-30%. He does not have a chest x-ray done since March of this year and we will need to repeat this. His regular cardiologist is Dr. Percival Spanish, but during his last  admission in March was seen by Dr. Mertie Moores, for acute on chronic congestive heart failure, moderate to severe aortic stenosis, elevated troponins, cardiomyopathy. Assessment Active Problems ICD-10 JOSEFINA, RAFFETTO ([id number]) E11.621 - Type 2 diabetes mellitus with foot ulcer I70.235 - Atherosclerosis of native arteries of right leg with ulceration of other part of foot L97.512 - Non-pressure chronic ulcer of other part of right foot with fat layer exposed Z89.512 - Acquired absence of left leg below knee This 75 year old gentleman with poorly controlled diabetes mellitus and significant peripheral vascular disease has ulceration of the right forefoot with significant rubor and poor vascularity of the forefoot. 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 He's had a recent angiogram which shows a good runoff towards the foot but I'm afraid this may not be good enough to heal the ulcers on the forefoot. At the present time I  am going to recommend: 1. Silver alginate over the wounds in the web space of the fourth toe. 2. we will use Betadine on the medial part of the right forefoot where he has a dry gangrene 3. X-ray of the right foot results reviewed with the patient and though they are essentially normal I will proceed with an MRI 4. Continue to offload this foot as much as possible. 5. depending on the workup, if this is a Wagner 4 ulceration he may benefit from hyperbaric oxygen therapy and we have discussed this in detail with the risks benefits and alternatives explained to him and he is agreeable to proceed with this. 6. due to his significant cardiac history and low ejection fraction when he was admitted  in March I'm recommending a review by his cardiologist for fitness regarding possible hyperbaric oxygen therapy. In the meanwhile all pertinent discussions have been had with the patient and he fully understands the treatment plan. Procedures Wound #2 Wound #2 is a Diabetic Wound/Ulcer of the Lower Extremity located on the Right Toe - Web between 4th and 5th . There was a Skin/Subcutaneous Tissue Debridement [id number]) debridement with total area of 1.2 sq cm performed by Christin Fudge, MD. with the following instrument(s): Curette to remove Viable and Non-Viable tissue/material including Exudate, Fibrin/Slough, Skin, and Subcutaneous after achieving pain control using Other (lidocaine 4%). A time out was conducted prior to the start of the procedure. A Large amount of bleeding was controlled with Pressure. The procedure was tolerated well with a pain level of 0 throughout and a pain level of 0 following the procedure. Post Debridement Measurements: 1.2cm length x 1cm width x 0.2cm depth; 0.188cm^3 volume. Post procedure Diagnosis Wound #2: Same as Pre-Procedure [place], [place] ([id number]) Plan Wound Cleansing: Wound #1 Right,Medial Metatarsal head first: Clean wound with Normal Saline. Wound #2 Right Toe - Web between 4th and 5th: Clean wound with Normal Saline. Wound #3 Right,Dorsal Foot: Clean wound with Normal Saline. Anesthetic: Wound #1 Right,Medial Metatarsal head first: Topical Lidocaine 4% cream applied to wound bed prior to debridement Wound #2 Right Toe - Web between 4th and 5th: Topical Lidocaine 4% cream applied to wound bed prior to debridement Wound #3 Right,Dorsal Foot: Topical Lidocaine 4% cream applied to wound bed prior to debridement Skin Barriers/Peri-Wound Care: Wound #1 Right,Medial Metatarsal head first: Barrier cream Primary Wound Dressing: Wound #1 Right,Medial Metatarsal head first: Aquacel Ag Other: - betadine paint Wound #2 Right Toe - Web  between 4th and 5th: Aquacel Ag Wound #3 Right,Dorsal Foot: Other: - betadine paint Secondary Dressing: Wound #1 Right,Medial Metatarsal head first: ABD and Kerlix/Conform Wound #2 Right Toe - Web between 4th and 5th: ABD and Kerlix/Conform Wound #3 Right,Dorsal Foot: ABD and Kerlix/Conform Dressing Change Frequency: Wound #1 Right,Medial Metatarsal head first: Change dressing every other day. Wound #2 Right Toe - Web between 4th and 5th: Change dressing every other day. Follow-up Appointments: Wound #1 Right,Medial Metatarsal head first: Return Appointment in 1 week. Wound #2 Right Toe - Web between 4th and 5th: Return Appointment in 1 week. Wound #3 Right,Dorsal Foot: Return Appointment in 1 week. Additional Orders / InstructionsOMARR, SARMA ([id number]) Wound #1 Right,Medial Metatarsal head first: Increase protein intake. Wound #2 Right Toe - Web between 4th and 5th: Increase protein intake. Wound #3 Right,Dorsal Foot: Increase protein intake. Hyperbaric Oxygen Therapy: Wound #1 Right,Medial Metatarsal head first: Evaluate for HBO Therapy Wound #2 Right Toe - Web between 4th and 5th: Evaluate for HBO Therapy Wound #  3 Right,Dorsal Foot: Evaluate for HBO Therapy Radiology ordered were: MRI, lower extremity with contast - Right Foot This 75 year old gentleman with poorly controlled diabetes mellitus and significant peripheral vascular disease has ulceration of the right forefoot with significant rubor and poor vascularity of the forefoot. 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 He's had a recent angiogram which shows a good runoff towards the foot but I'm afraid this may not be good enough  to heal the ulcers on the forefoot. At the present time I am going to recommend: 1. Silver alginate over the wounds in the web space of the fourth toe. 2. we will use Betadine on the medial part of the right forefoot where he has a dry gangrene 3. X-ray of the right foot results reviewed with the patient and though they are essentially normal I will proceed with an MRI 4. Continue to offload this foot as much as possible. 5. depending on the workup, if this is a Wagner 4 ulceration he may benefit from hyperbaric oxygen therapy and we have discussed this in detail with the risks benefits and alternatives explained to him and he is agreeable to proceed with this. 6. due to his significant cardiac history and low ejection fraction when he was admitted in March I'm recommending a review by his cardiologist for fitness regarding possible hyperbaric oxygen therapy. In the meanwhile all pertinent discussions have been had with the patient and he fully understands the treatment plan. Electronic Signature(s) Signed: 05/31/2016 10:18:46 AM By: Christin Fudge MD, FACS Previous Signature: 05/31/2016 10:12:23 AM Version By: Christin Fudge MD, FACS Clifton, Hanson ([id number]) Entered By: Christin Fudge on 05/31/2016 10:18:45 Anthony Holder ([id number]) -------------------------------------------------------------------------------- SuperBill Details Patient Name: Anthony Holder Date of Service: 05/31/2016 Medical Record Number: [id number] Patient Account Number: [id number] Date of Birth/Sex: Mar 14, 1941 (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 foot with fat layer exposed Z89.512 Acquired absence of left leg below knee Facility Procedures CPT4: Description Modifier Quantity Code [id number] - DEB SUBQ TISSUE 20 SQ CM/< 1 ICD-10 Description Diagnosis 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 of other part of  foot 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 Physician Procedures CPT4: Description Modifier Quantity Code [id number] - WC PHYS LEVEL 3 - EST PT 25 1 ICD-10 Description Diagnosis 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

## 2016-06-01 ENCOUNTER — Other Ambulatory Visit: Payer: Self-pay | Admitting: Surgery

## 2016-06-01 DIAGNOSIS — M869 Osteomyelitis, unspecified: Secondary | ICD-10-CM

## 2016-06-01 NOTE — Progress Notes (Signed)
and Kerlix/Conform 7. Secured with Self adhesive bandage KRISTER, SHIRE ([id number]) Electronic Signature(s) Signed: 05/31/2016 5:25:15 PM By: [name], RN, [name], [name], [name] Signed: 05/31/2016 5:25:44 PM By: Alric Quan Entered By: [name], RN, [name], Kim on 05/31/2016 09:51:36 [place], Glenna Durand ([id number]) -------------------------------------------------------------------------------- Wound Assessment Details Patient Name: Anthony Holder Date of Service: 05/31/2016 9:15 AM Medical Record Number: [id number] Patient Account Number: [id number] Date of Birth/Sex: [birth year] ([name]) Treating RN: Ahmed Prima Primary Care Physician: [name] Other Clinician: Referring Physician: [name] Treating Physician/Extender: [name] in Treatment: 1 Wound Status Wound Number: 2 Primary Diabetic Wound/Ulcer of the Lower Etiology: Extremity Wound Location: Right Toe - Web between 4th and 5th Secondary Arterial Insufficiency Ulcer Etiology: Wounding Event: Gradually Appeared Wound Open Date Acquired:  05/10/2016 Status: Weeks Of Treatment: 1 Comorbid Cataracts, Coronary Artery Disease, Clustered Wound: No History: Deep Vein Thrombosis, Hypertension, Type II Diabetes, End Stage Renal Disease, Osteoarthritis, Neuropathy Photos Photo Uploaded By: Alric Quan on 05/31/2016 16:45:37 Wound Measurements Length: (cm) 1.2 Width: (cm) 1 Depth: (cm) 0.1 Area: (cm) 0.942 Volume: (cm) 0.094 % Reduction in Area: 0% % Reduction in Volume: 0% Epithelialization: None Tunneling: No Undermining: No Wound Description Classification: Grade 2 Wound Margin: Indistinct, nonvisible Exudate Amount: Large Exudate Type: Serous Exudate Color: amber Wound Bed Granulation Amount: None Present (0%) Exposed Structure Klutz, Ahmed ([id number]) Necrotic Amount: Large (67-100%) Fascia Exposed: No Necrotic Quality: Eschar, Adherent Slough Fat Layer Exposed: No Tendon Exposed: No Muscle Exposed: No Joint Exposed: No Bone Exposed: No Limited to Skin Breakdown Periwound Skin Texture Texture Color No Abnormalities Noted: No No Abnormalities Noted: No Callus: No Atrophie Blanche: No Crepitus: No Cyanosis: No Excoriation: No Ecchymosis: No Fluctuance: No Erythema: No Friable: No Hemosiderin Staining: No Induration: No Mottled: No Localized Edema: No Pallor: No Rash: No Rubor: No Scarring: No Moisture No Abnormalities Noted: No Dry / Scaly: No Maceration: Yes Moist: No Wound Preparation Ulcer Cleansing: Rinsed/Irrigated with Saline Topical Anesthetic Applied: Other: lidocaine 4%, Treatment Notes Wound #2 (Right Toe - Web between 4th and 5th) 1. Cleansed with: Clean wound with Normal Saline 2. Anesthetic Topical Lidocaine 4% cream to wound bed prior to debridement 3. Peri-wound Care: Barrier cream 4. Dressing Applied: Aquacel Ag 5. Secondary Dressing Applied ABD and Kerlix/Conform 7. Secured with Self adhesive bandage Electronic Signature(s) Signed: 05/31/2016  5:25:44 PM By: Levy Pupa, Regie ([id number]) Entered By: Alric Quan on 05/31/2016 09:10:09 Anthony Holder ([id number]) -------------------------------------------------------------------------------- Wound Assessment Details Patient Name: Anthony Holder Date of Service: 05/31/2016 9:15 AM Medical Record Number: [id number] Patient Account Number: [id number] Date of Birth/Sex: 08-27-1941 ([name]) Treating RN: Ahmed Prima Primary Care Physician: [name] Other Clinician: Referring Physician: [name] Treating Physician/Extender: [name] in Treatment: 1 Wound Status Wound Number: 3 Primary Diabetic Wound/Ulcer of the Lower Etiology: Extremity Wound Location: Right Foot - Dorsal Secondary Trauma, Other Wounding Event: Trauma Etiology: Date Acquired: 05/10/2016 Wound Open Weeks Of Treatment: 1 Status: Clustered Wound: No Comorbid Cataracts, Coronary Artery Disease, History: Deep Vein Thrombosis, Hypertension, Type II Diabetes, End Stage Renal Disease, Osteoarthritis, Neuropathy Photos Photo Uploaded By: Alric Quan on 05/31/2016 16:46:08 Wound Measurements Length: (cm) 2.5 Width: (cm) 1.4 Depth: (cm) 0.1 Area: (cm) 2.749 Volume: (cm) 0.275 % Reduction in Area: -8% % Reduction in Volume: -8.3% Epithelialization: None Tunneling: No Undermining: No Wound Description Classification: Grade 1 Wound Margin: Flat and Intact Exudate Amount: Small Exudate Type: Serous Exudate Color: amber Foul Odor After Cleansing: No Wound Bed Granulation Amount: None Present (  diagnosis and as  needed Notes: Electronic Signature(s) Signed: 05/31/2016 5:25:44 PM By: Alric Quan Entered By: Alric Quan on 05/31/2016 09:12:51 Anthony Holder ([id number]) -------------------------------------------------------------------------------- Pain Assessment Details Patient Name: Anthony Holder Date of Service: 05/31/2016 9:15 AM Medical Record Number: [id number] Patient Account Number: [id number] Date of Birth/Sex: 15-Apr-1941 ([name]) Treating RN: Ahmed Prima Primary Care Physician: [name] Other Clinician: Referring Physician: [name] Treating Physician/Extender: [name] in Treatment: 1 Active Problems Location of Pain Severity and Description of Pain Patient Has Paino Yes Site Locations Pain Location: Pain in Ulcers With Dressing Change: Yes Duration of the Pain. Constant / Intermittento Constant Rate the pain. Current Pain Level: 6 Character of Pain Describe the Pain: Aching Pain Management and Medication Current Pain Management: Electronic Signature(s) Signed: 05/31/2016 5:25:44 PM By: Alric Quan Entered By: Alric Quan on 05/31/2016 09:03:52 Anthony Holder ([id number]) -------------------------------------------------------------------------------- Patient/Caregiver Education Details Patient Name: Anthony Holder Date of Service: 05/31/2016 9:15 AM Medical Record Number: [id number] Patient Account Number: [id number] Date of Birth/Gender: [birth year] ([name]) Treating RN: [name] Primary Care Physician: [name] Other Clinician: Referring Physician: [name] Treating Physician/Extender: [name] in Treatment: 1 Education Assessment Education Provided To: Patient Education Topics Provided Wound/Skin Impairment: Handouts: Caring for Your Ulcer, Other: wound care as prescribed Methods: Demonstration, Explain/Verbal Responses: State content correctly Electronic  Signature(s) Signed: 05/31/2016 5:25:15 PM By: [name], RN, [name], [name], [name] Entered By: [name], RN, [name], Kim on 05/31/2016 09:57:58 Anthony Holder ([id number]) -------------------------------------------------------------------------------- Wound Assessment Details Patient Name: Anthony Holder Date of Service: 05/31/2016 9:15 AM Medical Record Number: [id number] Patient Account Number: [id number] Date of Birth/Sex: 11/11/1941 ([name]) Treating RN: Ahmed Prima Primary Care Physician: [name] Other Clinician: Referring Physician: [name] Treating Physician/Extender: [name] in Treatment: 1 Wound Status Wound Number: 1 Primary Diabetic Wound/Ulcer of the Lower Etiology: Extremity Wound Location: Right Metatarsal head first - Medial Secondary Arterial Insufficiency Ulcer Etiology: Wounding Event: Gradually Appeared Wound Open Date Acquired: 05/10/2016 Status: Weeks Of Treatment: 1 Comorbid Cataracts, Coronary Artery Disease, Clustered Wound: No History: Deep Vein Thrombosis, Hypertension, Pending Amputation On Presentation Type II Diabetes, End Stage Renal Disease, Osteoarthritis, Neuropathy Photos Photo Uploaded By: Alric Quan on 05/31/2016 16:45:37 Wound Measurements Length: (cm) 3 Width: (cm) 4 Depth: (cm) 0.1 Area: (cm) 9.425 Volume: (cm) 0.942 % Reduction in Area: -15.4% % Reduction in Volume: -15.3% Epithelialization: None Tunneling: No Undermining: No Wound Description Classification: Grade 4 Wagner Verification: Other Wound Margin: Flat and Intact Method: gangrene Rationale: MD decision Exudate Amount: None Present Wound Bed Stroble, Bralin ([id number]) Granulation Amount: None Present (0%) Exposed Structure Necrotic Amount: Large (67-100%) Fascia Exposed: No Necrotic Quality: Eschar Fat Layer Exposed: No Tendon Exposed: No Muscle Exposed: No Joint Exposed: No Bone Exposed: No Limited to Skin  Breakdown Periwound Skin Texture Texture Color No Abnormalities Noted: No No Abnormalities Noted: No Callus: No Atrophie Blanche: No Crepitus: No Cyanosis: No Excoriation: No Ecchymosis: No Fluctuance: No Erythema: No Friable: No Hemosiderin Staining: No Induration: No Mottled: No Localized Edema: No Pallor: No Rash: No Rubor: No Scarring: No Moisture No Abnormalities Noted: No Dry / Scaly: No Maceration: Yes Moist: No Wound Preparation Ulcer Cleansing: Rinsed/Irrigated with Saline Topical Anesthetic Applied: Other: lidocaine4%, Treatment Notes Wound #1 (Right, Medial Metatarsal head first) 1. Cleansed with: Clean wound with Normal Saline 2. Anesthetic Topical Lidocaine 4% cream to wound bed prior to debridement 3. Peri-wound Care: Barrier cream 4. Dressing Applied: Aquacel Ag 5. Secondary Dressing Applied ABD  diagnosis and as  needed Notes: Electronic Signature(s) Signed: 05/31/2016 5:25:44 PM By: Alric Quan Entered By: Alric Quan on 05/31/2016 09:12:51 Anthony Holder ([id number]) -------------------------------------------------------------------------------- Pain Assessment Details Patient Name: Anthony Holder Date of Service: 05/31/2016 9:15 AM Medical Record Number: [id number] Patient Account Number: [id number] Date of Birth/Sex: 15-Apr-1941 ([name]) Treating RN: Ahmed Prima Primary Care Physician: [name] Other Clinician: Referring Physician: [name] Treating Physician/Extender: [name] in Treatment: 1 Active Problems Location of Pain Severity and Description of Pain Patient Has Paino Yes Site Locations Pain Location: Pain in Ulcers With Dressing Change: Yes Duration of the Pain. Constant / Intermittento Constant Rate the pain. Current Pain Level: 6 Character of Pain Describe the Pain: Aching Pain Management and Medication Current Pain Management: Electronic Signature(s) Signed: 05/31/2016 5:25:44 PM By: Alric Quan Entered By: Alric Quan on 05/31/2016 09:03:52 Anthony Holder ([id number]) -------------------------------------------------------------------------------- Patient/Caregiver Education Details Patient Name: Anthony Holder Date of Service: 05/31/2016 9:15 AM Medical Record Number: [id number] Patient Account Number: [id number] Date of Birth/Gender: [birth year] ([name]) Treating RN: [name] Primary Care Physician: [name] Other Clinician: Referring Physician: [name] Treating Physician/Extender: [name] in Treatment: 1 Education Assessment Education Provided To: Patient Education Topics Provided Wound/Skin Impairment: Handouts: Caring for Your Ulcer, Other: wound care as prescribed Methods: Demonstration, Explain/Verbal Responses: State content correctly Electronic  Signature(s) Signed: 05/31/2016 5:25:15 PM By: [name], RN, [name], [name], [name] Entered By: [name], RN, [name], Kim on 05/31/2016 09:57:58 Anthony Holder ([id number]) -------------------------------------------------------------------------------- Wound Assessment Details Patient Name: Anthony Holder Date of Service: 05/31/2016 9:15 AM Medical Record Number: [id number] Patient Account Number: [id number] Date of Birth/Sex: 11/11/1941 ([name]) Treating RN: Ahmed Prima Primary Care Physician: [name] Other Clinician: Referring Physician: [name] Treating Physician/Extender: [name] in Treatment: 1 Wound Status Wound Number: 1 Primary Diabetic Wound/Ulcer of the Lower Etiology: Extremity Wound Location: Right Metatarsal head first - Medial Secondary Arterial Insufficiency Ulcer Etiology: Wounding Event: Gradually Appeared Wound Open Date Acquired: 05/10/2016 Status: Weeks Of Treatment: 1 Comorbid Cataracts, Coronary Artery Disease, Clustered Wound: No History: Deep Vein Thrombosis, Hypertension, Pending Amputation On Presentation Type II Diabetes, End Stage Renal Disease, Osteoarthritis, Neuropathy Photos Photo Uploaded By: Alric Quan on 05/31/2016 16:45:37 Wound Measurements Length: (cm) 3 Width: (cm) 4 Depth: (cm) 0.1 Area: (cm) 9.425 Volume: (cm) 0.942 % Reduction in Area: -15.4% % Reduction in Volume: -15.3% Epithelialization: None Tunneling: No Undermining: No Wound Description Classification: Grade 4 Wagner Verification: Other Wound Margin: Flat and Intact Method: gangrene Rationale: MD decision Exudate Amount: None Present Wound Bed Stroble, Bralin ([id number]) Granulation Amount: None Present (0%) Exposed Structure Necrotic Amount: Large (67-100%) Fascia Exposed: No Necrotic Quality: Eschar Fat Layer Exposed: No Tendon Exposed: No Muscle Exposed: No Joint Exposed: No Bone Exposed: No Limited to Skin  Breakdown Periwound Skin Texture Texture Color No Abnormalities Noted: No No Abnormalities Noted: No Callus: No Atrophie Blanche: No Crepitus: No Cyanosis: No Excoriation: No Ecchymosis: No Fluctuance: No Erythema: No Friable: No Hemosiderin Staining: No Induration: No Mottled: No Localized Edema: No Pallor: No Rash: No Rubor: No Scarring: No Moisture No Abnormalities Noted: No Dry / Scaly: No Maceration: Yes Moist: No Wound Preparation Ulcer Cleansing: Rinsed/Irrigated with Saline Topical Anesthetic Applied: Other: lidocaine4%, Treatment Notes Wound #1 (Right, Medial Metatarsal head first) 1. Cleansed with: Clean wound with Normal Saline 2. Anesthetic Topical Lidocaine 4% cream to wound bed prior to debridement 3. Peri-wound Care: Barrier cream 4. Dressing Applied: Aquacel Ag 5. Secondary Dressing Applied ABD  3:05/10/2016] Weeks of Treatment: [1:1] [2:1] [3:1] Wound Status: [1:Open] [2:Open] [3:Open] Pending Amputation on Yes [2:No] [3:No] Presentation: Measurements L x W x D 3x4x0.1 [2:1.2x1x0.1] [3:2.5x1.4x0.1] (cm) Area (cm) : [1:9.425] [2:0.942] [3:2.749] Volume (cm) : [1:0.942] [2:0.094] [3:0.275] % Reduction in Area: [1:-15.40%] [2:0.00%] [3:-8.00%] % Reduction in Volume: -15.30% [2:0.00%] [3:-8.30%] Classification: [1:Unable to visualize wound Grade 2 bed] [3:Grade 1] Exudate Amount: [1:None Present] [2:Large] [3:Small] Exudate Type: [1:N/A] [2:Serous] [3:Serous] Exudate Color: [1:N/A] [2:amber] [3:amber] Wound Margin: Flat and Intact Indistinct, nonvisible Flat and Intact Granulation Amount: None Present (0%) None Present (0%) None Present (0%) Necrotic Amount: Large (67-100%) Large (67-100%) Large (67-100%) Necrotic Tissue: Eschar Eschar, Adherent Slough  Adherent Slough Exposed Structures: Fascia: No Fascia: No Fascia: No Fat: No Fat: No Fat: No Tendon: No Tendon: No Tendon: No Muscle: No Muscle: No Muscle: No Joint: No Joint: No Joint: No Bone: No Bone: No Bone: No Limited to Skin Limited to Skin Limited to Skin Breakdown Breakdown Breakdown Epithelialization: None None None Periwound Skin Texture: Edema: No Edema: No Edema: No Excoriation: No Excoriation: No Excoriation: No Induration: No Induration: No Induration: No Callus: No Callus: No Callus: No Crepitus: No Crepitus: No Crepitus: No Fluctuance: No Fluctuance: No Fluctuance: No Friable: No Friable: No Friable: No Rash: No Rash: No Rash: No Scarring: No Scarring: No Scarring: No Periwound Skin Maceration: Yes Maceration: Yes Maceration: No Moisture: Moist: No Moist: No Moist: No Dry/Scaly: No Dry/Scaly: No Dry/Scaly: No Periwound Skin Color: Atrophie Blanche: No Atrophie Blanche: No Atrophie Blanche: No Cyanosis: No Cyanosis: No Cyanosis: No Ecchymosis: No Ecchymosis: No Ecchymosis: No Erythema: No Erythema: No Erythema: No Hemosiderin Staining: No Hemosiderin Staining: No Hemosiderin Staining: No Mottled: No Mottled: No Mottled: No Pallor: No Pallor: No Pallor: No Rubor: No Rubor: No Rubor: No Tenderness on No No No Palpation: Wound Preparation: Ulcer Cleansing: Ulcer Cleansing: Ulcer Cleansing: Rinsed/Irrigated with Rinsed/Irrigated with Rinsed/Irrigated with Saline Saline Saline Topical Anesthetic Topical Anesthetic Topical Anesthetic Applied: Other: Applied: Other: lidocaine Applied: Other: lidociane lidocaine4% 4% 4% Treatment Notes Electronic Signature(s) Signed: 05/31/2016 5:25:44 PM By: Alric Quan Entered By: Alric Quan on 05/31/2016 09:12:59 TRUNG, ARDITO ([id number]) [place], Glenna Durand  ([id number]) -------------------------------------------------------------------------------- Multi-Disciplinary Care Plan Details Patient Name: Anthony Holder Date of Service: 05/31/2016 9:15 AM Medical Record Number: [id number] Patient Account Number: [id number] Date of Birth/Sex: 25-Nov-1941 ([name]) Treating RN: Ahmed Prima Primary Care Physician: [name] Other Clinician: Referring Physician: [name] Treating Physician/Extender: [name] in Treatment: 1 Active Inactive Necrotic Tissue Nursing Diagnoses: Impaired tissue integrity related to necrotic/devitalized tissue Goals: Necrotic/devitalized tissue will be minimized in the wound bed Date Initiated: 05/24/2016 Goal Status: Active Interventions: Provide education on necrotic tissue and debridement process Treatment Activities: Apply topical anesthetic as ordered : 05/24/2016 Notes: Nutrition Nursing Diagnoses: Imbalanced nutrition Goals: Patient/caregiver agrees to and verbalizes understanding of need to obtain nutritional consultation Date Initiated: 05/24/2016 Goal Status: Active Interventions: Assess HgA1c results as ordered upon admission and as needed Treatment Activities: Obtain HgA1c : 05/24/2016 Notes: Orientation to the [facility] [place], [place] ([id number]) Nursing Diagnoses: Knowledge deficit related to the wound healing center program Goals: Patient/caregiver will verbalize understanding of the [place] Date Initiated: 05/24/2016 Goal Status: Active Interventions: Provide education on orientation to the wound center Notes: Tissue Oxygenation Nursing Diagnoses: Actual ineffective tissue perfusion; peripheral (select once diagnosis is confirmed) Goals: Invasive arterial studies completed as ordered Date Initiated: 05/24/2016 Goal Status: Active Interventions: Assess patient understanding of disease process and management upon  3:05/10/2016] Weeks of Treatment: [1:1] [2:1] [3:1] Wound Status: [1:Open] [2:Open] [3:Open] Pending Amputation on Yes [2:No] [3:No] Presentation: Measurements L x W x D 3x4x0.1 [2:1.2x1x0.1] [3:2.5x1.4x0.1] (cm) Area (cm) : [1:9.425] [2:0.942] [3:2.749] Volume (cm) : [1:0.942] [2:0.094] [3:0.275] % Reduction in Area: [1:-15.40%] [2:0.00%] [3:-8.00%] % Reduction in Volume: -15.30% [2:0.00%] [3:-8.30%] Classification: [1:Unable to visualize wound Grade 2 bed] [3:Grade 1] Exudate Amount: [1:None Present] [2:Large] [3:Small] Exudate Type: [1:N/A] [2:Serous] [3:Serous] Exudate Color: [1:N/A] [2:amber] [3:amber] Wound Margin: Flat and Intact Indistinct, nonvisible Flat and Intact Granulation Amount: None Present (0%) None Present (0%) None Present (0%) Necrotic Amount: Large (67-100%) Large (67-100%) Large (67-100%) Necrotic Tissue: Eschar Eschar, Adherent Slough  Adherent Slough Exposed Structures: Fascia: No Fascia: No Fascia: No Fat: No Fat: No Fat: No Tendon: No Tendon: No Tendon: No Muscle: No Muscle: No Muscle: No Joint: No Joint: No Joint: No Bone: No Bone: No Bone: No Limited to Skin Limited to Skin Limited to Skin Breakdown Breakdown Breakdown Epithelialization: None None None Periwound Skin Texture: Edema: No Edema: No Edema: No Excoriation: No Excoriation: No Excoriation: No Induration: No Induration: No Induration: No Callus: No Callus: No Callus: No Crepitus: No Crepitus: No Crepitus: No Fluctuance: No Fluctuance: No Fluctuance: No Friable: No Friable: No Friable: No Rash: No Rash: No Rash: No Scarring: No Scarring: No Scarring: No Periwound Skin Maceration: Yes Maceration: Yes Maceration: No Moisture: Moist: No Moist: No Moist: No Dry/Scaly: No Dry/Scaly: No Dry/Scaly: No Periwound Skin Color: Atrophie Blanche: No Atrophie Blanche: No Atrophie Blanche: No Cyanosis: No Cyanosis: No Cyanosis: No Ecchymosis: No Ecchymosis: No Ecchymosis: No Erythema: No Erythema: No Erythema: No Hemosiderin Staining: No Hemosiderin Staining: No Hemosiderin Staining: No Mottled: No Mottled: No Mottled: No Pallor: No Pallor: No Pallor: No Rubor: No Rubor: No Rubor: No Tenderness on No No No Palpation: Wound Preparation: Ulcer Cleansing: Ulcer Cleansing: Ulcer Cleansing: Rinsed/Irrigated with Rinsed/Irrigated with Rinsed/Irrigated with Saline Saline Saline Topical Anesthetic Topical Anesthetic Topical Anesthetic Applied: Other: Applied: Other: lidocaine Applied: Other: lidociane lidocaine4% 4% 4% Treatment Notes Electronic Signature(s) Signed: 05/31/2016 5:25:44 PM By: Alric Quan Entered By: Alric Quan on 05/31/2016 09:12:59 TRUNG, ARDITO ([id number]) [place], Glenna Durand  ([id number]) -------------------------------------------------------------------------------- Multi-Disciplinary Care Plan Details Patient Name: Anthony Holder Date of Service: 05/31/2016 9:15 AM Medical Record Number: [id number] Patient Account Number: [id number] Date of Birth/Sex: 25-Nov-1941 ([name]) Treating RN: Ahmed Prima Primary Care Physician: [name] Other Clinician: Referring Physician: [name] Treating Physician/Extender: [name] in Treatment: 1 Active Inactive Necrotic Tissue Nursing Diagnoses: Impaired tissue integrity related to necrotic/devitalized tissue Goals: Necrotic/devitalized tissue will be minimized in the wound bed Date Initiated: 05/24/2016 Goal Status: Active Interventions: Provide education on necrotic tissue and debridement process Treatment Activities: Apply topical anesthetic as ordered : 05/24/2016 Notes: Nutrition Nursing Diagnoses: Imbalanced nutrition Goals: Patient/caregiver agrees to and verbalizes understanding of need to obtain nutritional consultation Date Initiated: 05/24/2016 Goal Status: Active Interventions: Assess HgA1c results as ordered upon admission and as needed Treatment Activities: Obtain HgA1c : 05/24/2016 Notes: Orientation to the [facility] [place], [place] ([id number]) Nursing Diagnoses: Knowledge deficit related to the wound healing center program Goals: Patient/caregiver will verbalize understanding of the [facility] Date Initiated: 05/24/2016 Goal Status: Active Interventions: Provide education on orientation to the wound center Notes: Tissue Oxygenation Nursing Diagnoses: Actual ineffective tissue perfusion; peripheral (select once diagnosis is confirmed) Goals: Invasive arterial studies completed as ordered Date Initiated: 05/24/2016 Goal Status: Active Interventions: Assess patient understanding of disease process and management upon

## 2016-06-02 ENCOUNTER — Ambulatory Visit
Admission: RE | Admit: 2016-06-02 | Discharge: 2016-06-02 | Disposition: A | Discharge status: Home / Self Care | Payer: Medicare Other | Source: Ambulatory Visit | Attending: Surgery | Admitting: Surgery

## 2016-06-02 DIAGNOSIS — R600 Localized edema: Secondary | ICD-10-CM | POA: Insufficient documentation

## 2016-06-02 DIAGNOSIS — S91301A Unspecified open wound, right foot, initial encounter: Secondary | ICD-10-CM | POA: Diagnosis not present

## 2016-06-02 DIAGNOSIS — S91101A Unspecified open wound of right great toe without damage to nail, initial encounter: Secondary | ICD-10-CM | POA: Diagnosis not present

## 2016-06-02 DIAGNOSIS — X58XXXA Exposure to other specified factors, initial encounter: Secondary | ICD-10-CM | POA: Insufficient documentation

## 2016-06-02 DIAGNOSIS — M19071 Primary osteoarthritis, right ankle and foot: Secondary | ICD-10-CM | POA: Insufficient documentation

## 2016-06-02 DIAGNOSIS — M869 Osteomyelitis, unspecified: Secondary | ICD-10-CM

## 2016-06-02 MED ORDER — GADOBENATE DIMEGLUMINE 529 MG/ML IV SOLN
20.0000 mL | Freq: Once | INTRAVENOUS | Status: AC | PRN
  Administered 2016-06-02: 18 mL via INTRAVENOUS

## 2016-06-03 ENCOUNTER — Other Ambulatory Visit: Payer: Self-pay

## 2016-06-03 ENCOUNTER — Emergency Department (HOSPITAL_COMMUNITY): Payer: Medicare Other

## 2016-06-03 ENCOUNTER — Inpatient Hospital Stay (HOSPITAL_COMMUNITY)
Admission: EM | Admit: 2016-06-03 | Discharge: 2016-06-17 | DRG: 270 | Disposition: A | Discharge status: Skilled Nursing Facility | Payer: Medicare Other | Attending: Internal Medicine | Admitting: Internal Medicine

## 2016-06-03 ENCOUNTER — Encounter (HOSPITAL_COMMUNITY): Payer: Self-pay | Admitting: Neurology

## 2016-06-03 DIAGNOSIS — Z89512 Acquired absence of left leg below knee: Secondary | ICD-10-CM | POA: Diagnosis not present

## 2016-06-03 DIAGNOSIS — E1152 Type 2 diabetes mellitus with diabetic peripheral angiopathy with gangrene: Secondary | ICD-10-CM | POA: Diagnosis not present

## 2016-06-03 DIAGNOSIS — I130 Hypertensive heart and chronic kidney disease with heart failure and stage 1 through stage 4 chronic kidney disease, or unspecified chronic kidney disease: Secondary | ICD-10-CM | POA: Diagnosis not present

## 2016-06-03 DIAGNOSIS — L97519 Non-pressure chronic ulcer of other part of right foot with unspecified severity: Secondary | ICD-10-CM | POA: Diagnosis not present

## 2016-06-03 DIAGNOSIS — N400 Enlarged prostate without lower urinary tract symptoms: Secondary | ICD-10-CM | POA: Diagnosis not present

## 2016-06-03 DIAGNOSIS — I96 Gangrene, not elsewhere classified: Secondary | ICD-10-CM | POA: Diagnosis not present

## 2016-06-03 DIAGNOSIS — I272 Other secondary pulmonary hypertension: Secondary | ICD-10-CM | POA: Diagnosis not present

## 2016-06-03 DIAGNOSIS — I6523 Occlusion and stenosis of bilateral carotid arteries: Secondary | ICD-10-CM | POA: Diagnosis present

## 2016-06-03 DIAGNOSIS — Z7982 Long term (current) use of aspirin: Secondary | ICD-10-CM

## 2016-06-03 DIAGNOSIS — I5023 Acute on chronic systolic (congestive) heart failure: Secondary | ICD-10-CM | POA: Diagnosis not present

## 2016-06-03 DIAGNOSIS — I350 Nonrheumatic aortic (valve) stenosis: Secondary | ICD-10-CM | POA: Insufficient documentation

## 2016-06-03 DIAGNOSIS — E1140 Type 2 diabetes mellitus with diabetic neuropathy, unspecified: Secondary | ICD-10-CM | POA: Diagnosis not present

## 2016-06-03 DIAGNOSIS — E11621 Type 2 diabetes mellitus with foot ulcer: Secondary | ICD-10-CM | POA: Diagnosis not present

## 2016-06-03 DIAGNOSIS — D638 Anemia in other chronic diseases classified elsewhere: Secondary | ICD-10-CM | POA: Diagnosis present

## 2016-06-03 DIAGNOSIS — R079 Chest pain, unspecified: Secondary | ICD-10-CM | POA: Diagnosis not present

## 2016-06-03 DIAGNOSIS — E119 Type 2 diabetes mellitus without complications: Secondary | ICD-10-CM

## 2016-06-03 DIAGNOSIS — R000 Tachycardia, unspecified: Secondary | ICD-10-CM | POA: Diagnosis not present

## 2016-06-03 DIAGNOSIS — M48061 Spinal stenosis, lumbar region without neurogenic claudication: Secondary | ICD-10-CM | POA: Diagnosis present

## 2016-06-03 DIAGNOSIS — E7800 Pure hypercholesterolemia, unspecified: Secondary | ICD-10-CM | POA: Diagnosis present

## 2016-06-03 DIAGNOSIS — S91301A Unspecified open wound, right foot, initial encounter: Secondary | ICD-10-CM | POA: Diagnosis not present

## 2016-06-03 DIAGNOSIS — E876 Hypokalemia: Secondary | ICD-10-CM | POA: Diagnosis present

## 2016-06-03 DIAGNOSIS — I255 Ischemic cardiomyopathy: Secondary | ICD-10-CM | POA: Diagnosis not present

## 2016-06-03 DIAGNOSIS — L03115 Cellulitis of right lower limb: Secondary | ICD-10-CM | POA: Diagnosis present

## 2016-06-03 DIAGNOSIS — E11628 Type 2 diabetes mellitus with other skin complications: Secondary | ICD-10-CM | POA: Diagnosis present

## 2016-06-03 DIAGNOSIS — I77810 Thoracic aortic ectasia: Secondary | ICD-10-CM | POA: Diagnosis not present

## 2016-06-03 DIAGNOSIS — I5022 Chronic systolic (congestive) heart failure: Secondary | ICD-10-CM | POA: Diagnosis not present

## 2016-06-03 DIAGNOSIS — Z794 Long term (current) use of insulin: Secondary | ICD-10-CM

## 2016-06-03 DIAGNOSIS — Z951 Presence of aortocoronary bypass graft: Secondary | ICD-10-CM

## 2016-06-03 DIAGNOSIS — Z87891 Personal history of nicotine dependence: Secondary | ICD-10-CM

## 2016-06-03 DIAGNOSIS — Z79899 Other long term (current) drug therapy: Secondary | ICD-10-CM

## 2016-06-03 DIAGNOSIS — E782 Mixed hyperlipidemia: Secondary | ICD-10-CM | POA: Diagnosis present

## 2016-06-03 DIAGNOSIS — K219 Gastro-esophageal reflux disease without esophagitis: Secondary | ICD-10-CM | POA: Diagnosis present

## 2016-06-03 DIAGNOSIS — N183 Chronic kidney disease, stage 3 (moderate): Secondary | ICD-10-CM | POA: Diagnosis not present

## 2016-06-03 DIAGNOSIS — J449 Chronic obstructive pulmonary disease, unspecified: Secondary | ICD-10-CM | POA: Diagnosis present

## 2016-06-03 DIAGNOSIS — I2510 Atherosclerotic heart disease of native coronary artery without angina pectoris: Secondary | ICD-10-CM | POA: Diagnosis not present

## 2016-06-03 DIAGNOSIS — Z85828 Personal history of other malignant neoplasm of skin: Secondary | ICD-10-CM

## 2016-06-03 DIAGNOSIS — E871 Hypo-osmolality and hyponatremia: Secondary | ICD-10-CM | POA: Diagnosis not present

## 2016-06-03 DIAGNOSIS — Z8673 Personal history of transient ischemic attack (TIA), and cerebral infarction without residual deficits: Secondary | ICD-10-CM | POA: Diagnosis not present

## 2016-06-03 DIAGNOSIS — R7989 Other specified abnormal findings of blood chemistry: Secondary | ICD-10-CM | POA: Diagnosis not present

## 2016-06-03 DIAGNOSIS — I25700 Atherosclerosis of coronary artery bypass graft(s), unspecified, with unstable angina pectoris: Secondary | ICD-10-CM | POA: Diagnosis not present

## 2016-06-03 DIAGNOSIS — I739 Peripheral vascular disease, unspecified: Secondary | ICD-10-CM | POA: Diagnosis present

## 2016-06-03 DIAGNOSIS — E1129 Type 2 diabetes mellitus with other diabetic kidney complication: Secondary | ICD-10-CM | POA: Diagnosis present

## 2016-06-03 DIAGNOSIS — E778 Other disorders of glycoprotein metabolism: Secondary | ICD-10-CM | POA: Diagnosis present

## 2016-06-03 DIAGNOSIS — Z7984 Long term (current) use of oral hypoglycemic drugs: Secondary | ICD-10-CM

## 2016-06-03 DIAGNOSIS — I10 Essential (primary) hypertension: Secondary | ICD-10-CM | POA: Diagnosis present

## 2016-06-03 DIAGNOSIS — E1122 Type 2 diabetes mellitus with diabetic chronic kidney disease: Secondary | ICD-10-CM | POA: Diagnosis not present

## 2016-06-03 DIAGNOSIS — Z89519 Acquired absence of unspecified leg below knee: Secondary | ICD-10-CM

## 2016-06-03 DIAGNOSIS — R778 Other specified abnormalities of plasma proteins: Secondary | ICD-10-CM

## 2016-06-03 DIAGNOSIS — E11622 Type 2 diabetes mellitus with other skin ulcer: Secondary | ICD-10-CM | POA: Diagnosis not present

## 2016-06-03 DIAGNOSIS — D509 Iron deficiency anemia, unspecified: Secondary | ICD-10-CM | POA: Diagnosis present

## 2016-06-03 DIAGNOSIS — M4806 Spinal stenosis, lumbar region: Secondary | ICD-10-CM | POA: Diagnosis not present

## 2016-06-03 DIAGNOSIS — R0602 Shortness of breath: Secondary | ICD-10-CM

## 2016-06-03 DIAGNOSIS — I25810 Atherosclerosis of coronary artery bypass graft(s) without angina pectoris: Secondary | ICD-10-CM | POA: Insufficient documentation

## 2016-06-03 DIAGNOSIS — Z981 Arthrodesis status: Secondary | ICD-10-CM

## 2016-06-03 HISTORY — DX: Ischemic cardiomyopathy: I25.5

## 2016-06-03 HISTORY — DX: Other disorder of circulatory system: I99.8

## 2016-06-03 LAB — BASIC METABOLIC PANEL
Anion gap: 9 (ref 5–15)
BUN: 22 mg/dL — AB (ref 6–20)
CALCIUM: 9.4 mg/dL (ref 8.9–10.3)
CO2: 23 mmol/L (ref 22–32)
CREATININE: 1.16 mg/dL (ref 0.61–1.24)
Chloride: 100 mmol/L — ABNORMAL LOW (ref 101–111)
GFR calc non Af Amer: >60 mL/min (ref >60)
Glucose, Bld: 233 mg/dL — ABNORMAL HIGH (ref 65–99)
Potassium: 4.1 mmol/L (ref 3.5–5.1)
Sodium: 132 mmol/L — ABNORMAL LOW (ref 135–145)

## 2016-06-03 LAB — CBC
HCT: 36.5 % — ABNORMAL LOW (ref 39.0–52.0)
Hemoglobin: 11.9 g/dL — ABNORMAL LOW (ref 13.0–17.0)
MCH: 27.3 pg (ref 26.0–34.0)
MCHC: 32.6 g/dL (ref 30.0–36.0)
MCV: 83.7 fL (ref 78.0–100.0)
PLATELETS: 303 10*3/uL (ref 150–400)
RBC: 4.36 MIL/uL (ref 4.22–5.81)
RDW: 15.1 % (ref 11.5–15.5)
WBC: 16.2 10*3/uL — ABNORMAL HIGH (ref 4.0–10.5)

## 2016-06-03 LAB — GLUCOSE, CAPILLARY
GLUCOSE-CAPILLARY: 170 mg/dL — AB (ref 65–99)
GLUCOSE-CAPILLARY: 155 mg/dL — AB (ref 65–99)

## 2016-06-03 LAB — I-STAT TROPONIN, ED: TROPONIN I, POC: 0.22 ng/mL — AB (ref 0.00–0.08)

## 2016-06-03 LAB — TROPONIN I: TROPONIN I: 0.95 ng/mL — AB (ref <0.031)

## 2016-06-03 MED ORDER — INSULIN NPH (HUMAN) (ISOPHANE) 100 UNIT/ML ~~LOC~~ SUSP
15.0000 [IU] | Freq: Two times a day (BID) | SUBCUTANEOUS | Status: DC
  Administered 2016-06-03: 7 [IU] via SUBCUTANEOUS
  Administered 2016-06-04 – 2016-06-08 (×7): 15 [IU] via SUBCUTANEOUS
  Filled 2016-06-03: qty 10

## 2016-06-03 MED ORDER — INSULIN ASPART 100 UNIT/ML ~~LOC~~ SOLN
0.0000 [IU] | Freq: Every day | SUBCUTANEOUS | Status: DC
  Administered 2016-06-05: 3 [IU] via SUBCUTANEOUS
  Administered 2016-06-06: 2 [IU] via SUBCUTANEOUS
  Administered 2016-06-07: 4 [IU] via SUBCUTANEOUS
  Administered 2016-06-08 – 2016-06-14 (×7): 2 [IU] via SUBCUTANEOUS

## 2016-06-03 MED ORDER — POTASSIUM CHLORIDE CRYS ER 20 MEQ PO TBCR
20.0000 meq | EXTENDED_RELEASE_TABLET | Freq: Every day | ORAL | Status: DC
  Administered 2016-06-05 – 2016-06-17 (×10): 20 meq via ORAL
  Filled 2016-06-03 (×12): qty 1

## 2016-06-03 MED ORDER — ACETAMINOPHEN 325 MG PO TABS
650.0000 mg | ORAL_TABLET | ORAL | Status: DC | PRN
  Administered 2016-06-04 – 2016-06-06 (×4): 650 mg via ORAL
  Filled 2016-06-03 (×4): qty 2

## 2016-06-03 MED ORDER — ENOXAPARIN SODIUM 40 MG/0.4ML ~~LOC~~ SOLN
40.0000 mg | SUBCUTANEOUS | Status: DC
  Administered 2016-06-03 – 2016-06-06 (×3): 40 mg via SUBCUTANEOUS
  Filled 2016-06-03 (×4): qty 0.4

## 2016-06-03 MED ORDER — TRAMADOL HCL 50 MG PO TABS
50.0000 mg | ORAL_TABLET | Freq: Four times a day (QID) | ORAL | Status: DC | PRN
  Administered 2016-06-09 – 2016-06-13 (×3): 50 mg via ORAL
  Filled 2016-06-03 (×3): qty 1

## 2016-06-03 MED ORDER — MORPHINE SULFATE (PF) 2 MG/ML IV SOLN
2.0000 mg | INTRAVENOUS | Status: DC | PRN
  Administered 2016-06-04 – 2016-06-12 (×3): 2 mg via INTRAVENOUS
  Filled 2016-06-03 (×3): qty 1

## 2016-06-03 MED ORDER — GI COCKTAIL ~~LOC~~: 30.0000 mL | Freq: Four times a day (QID) | ORAL | Status: DC | PRN

## 2016-06-03 MED ORDER — FUROSEMIDE 40 MG PO TABS
40.0000 mg | ORAL_TABLET | Freq: Two times a day (BID) | ORAL | Status: DC
  Administered 2016-06-03 – 2016-06-04 (×2): 40 mg via ORAL
  Filled 2016-06-03 (×3): qty 1

## 2016-06-03 MED ORDER — GABAPENTIN 300 MG PO CAPS
300.0000 mg | ORAL_CAPSULE | Freq: Three times a day (TID) | ORAL | Status: DC
  Administered 2016-06-03 – 2016-06-17 (×28): 300 mg via ORAL
  Filled 2016-06-03 (×32): qty 1

## 2016-06-03 MED ORDER — ASPIRIN EC 81 MG PO TBEC
81.0000 mg | DELAYED_RELEASE_TABLET | Freq: Every day | ORAL | Status: DC
  Administered 2016-06-06 – 2016-06-17 (×11): 81 mg via ORAL
  Filled 2016-06-03 (×13): qty 1

## 2016-06-03 MED ORDER — ONDANSETRON HCL 4 MG/2ML IJ SOLN: 4.0000 mg | Freq: Four times a day (QID) | INTRAMUSCULAR | Status: DC | PRN

## 2016-06-03 MED ORDER — ATENOLOL 25 MG PO TABS
50.0000 mg | ORAL_TABLET | Freq: Every day | ORAL | Status: DC
  Administered 2016-06-04 – 2016-06-08 (×4): 50 mg via ORAL
  Filled 2016-06-03 (×4): qty 2

## 2016-06-03 MED ORDER — VANCOMYCIN HCL IN DEXTROSE 1-5 GM/200ML-% IV SOLN
1000.0000 mg | Freq: Two times a day (BID) | INTRAVENOUS | Status: DC
  Administered 2016-06-03 – 2016-06-11 (×14): 1000 mg via INTRAVENOUS
  Filled 2016-06-03 (×17): qty 200

## 2016-06-03 MED ORDER — PIPERACILLIN-TAZOBACTAM 3.375 G IVPB
3.3750 g | Freq: Three times a day (TID) | INTRAVENOUS | Status: DC
  Administered 2016-06-03 – 2016-06-15 (×34): 3.375 g via INTRAVENOUS
  Filled 2016-06-03 (×38): qty 50

## 2016-06-03 MED ORDER — BACLOFEN 10 MG PO TABS
10.0000 mg | ORAL_TABLET | Freq: Every day | ORAL | Status: DC
  Administered 2016-06-08 – 2016-06-14 (×6): 10 mg via ORAL
  Filled 2016-06-03 (×8): qty 1

## 2016-06-03 MED ORDER — INSULIN ASPART 100 UNIT/ML ~~LOC~~ SOLN
0.0000 [IU] | Freq: Three times a day (TID) | SUBCUTANEOUS | Status: DC
  Administered 2016-06-03: 2 [IU] via SUBCUTANEOUS
  Administered 2016-06-04: 7 [IU] via SUBCUTANEOUS
  Administered 2016-06-04 – 2016-06-05 (×2): 2 [IU] via SUBCUTANEOUS
  Administered 2016-06-05: 5 [IU] via SUBCUTANEOUS
  Administered 2016-06-06: 3 [IU] via SUBCUTANEOUS
  Administered 2016-06-06: 5 [IU] via SUBCUTANEOUS
  Administered 2016-06-06: 2 [IU] via SUBCUTANEOUS
  Administered 2016-06-08: 3 [IU] via SUBCUTANEOUS
  Administered 2016-06-08: 5 [IU] via SUBCUTANEOUS
  Administered 2016-06-08: 3 [IU] via SUBCUTANEOUS
  Administered 2016-06-09 – 2016-06-10 (×2): 2 [IU] via SUBCUTANEOUS
  Administered 2016-06-10: 1 [IU] via SUBCUTANEOUS
  Administered 2016-06-10: 3 [IU] via SUBCUTANEOUS
  Administered 2016-06-12: 5 [IU] via SUBCUTANEOUS
  Administered 2016-06-12 (×2): 3 [IU] via SUBCUTANEOUS
  Administered 2016-06-13: 5 [IU] via SUBCUTANEOUS
  Administered 2016-06-13: 2 [IU] via SUBCUTANEOUS
  Administered 2016-06-14: 3 [IU] via SUBCUTANEOUS
  Administered 2016-06-14 – 2016-06-15 (×3): 2 [IU] via SUBCUTANEOUS
  Administered 2016-06-15: 3 [IU] via SUBCUTANEOUS
  Administered 2016-06-16: 2 [IU] via SUBCUTANEOUS
  Administered 2016-06-17: 5 [IU] via SUBCUTANEOUS

## 2016-06-03 MED ORDER — FUROSEMIDE 40 MG PO TABS: 40.0000 mg | ORAL_TABLET | Freq: Two times a day (BID) | ORAL | Status: DC

## 2016-06-03 NOTE — ED Notes (Addendum)
Admit provider at bedside talking to family member at bedside.
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
## 2016-06-03 NOTE — ED Notes (Signed)
Provider returned and at bedside speaking with patient.
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
## 2016-06-03 NOTE — Progress Notes (Signed)
Pharmacy Antibiotic Note  Thaddaeus Capobianco is a 75 y.o. male admitted on 06/03/2016 with foot infection, neg for osteo per Mri.  Pharmacy has been consulted for vancomycin and zosyn dosing.  Plan: Vancomycin 1 IV every 12 hours.  Goal trough 10-15 mcg/mL. Zosyn 3.375g IV q8h (4 hour infusion).     Temp (24hrs), Avg:98.1 F (36.7 C), Min:98.1 F (36.7 C), Max:98.1 F (36.7 C)   Recent Labs Lab 06/03/16 1116  WBC 16.2*  CREATININE 1.16    CrCl cannot be calculated (Unknown ideal weight.).    Allergies  Allergen Reactions  . Statins Other (See Comments)    Patient prefers to not take statins    Levester Fresh, PharmD, BCPS, [facility] Clinical Pharmacist Pager [phone] 06/03/2016 1:53 PM

## 2016-06-03 NOTE — ED Notes (Addendum)
Pt reports wound to right toe that he is seeing wound center, reports it is black and this week has been having pain to right little toe. Reports last night his HR was elevated 95-105. Denies cp or sob. Is a x 4. Has left AKA. Pt also reports last night he had some cp that he thought was reflux.
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
## 2016-06-03 NOTE — ED Notes (Signed)
Heart Health/Carb modified diet ordered for patient.

## 2016-06-03 NOTE — ED Notes (Signed)
Patient transported to X-ray 

## 2016-06-03 NOTE — ED Notes (Signed)
Pt is only concerned about his toes and states he doesn't have any heart problems.

## 2016-06-03 NOTE — ED Notes (Signed)
Elmyra Ricks, [place] notified of elevated troponin and at bedside to evaluate pt. Dr. Audie Pinto also came in to assess pt.
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
## 2016-06-04 ENCOUNTER — Encounter (HOSPITAL_COMMUNITY)
Admission: EM | Disposition: A | Discharge status: Skilled Nursing Facility | Payer: Self-pay | Source: Home / Self Care | Attending: Family Medicine

## 2016-06-04 DIAGNOSIS — N400 Enlarged prostate without lower urinary tract symptoms: Secondary | ICD-10-CM

## 2016-06-04 DIAGNOSIS — I6523 Occlusion and stenosis of bilateral carotid arteries: Secondary | ICD-10-CM | POA: Diagnosis not present

## 2016-06-04 DIAGNOSIS — L97519 Non-pressure chronic ulcer of other part of right foot with unspecified severity: Secondary | ICD-10-CM | POA: Diagnosis not present

## 2016-06-04 DIAGNOSIS — I5022 Chronic systolic (congestive) heart failure: Secondary | ICD-10-CM | POA: Diagnosis not present

## 2016-06-04 LAB — GLUCOSE, CAPILLARY
GLUCOSE-CAPILLARY: 303 mg/dL — AB (ref 65–99)
GLUCOSE-CAPILLARY: 200 mg/dL — AB (ref 65–99)
Glucose-Capillary: 96 mg/dL (ref 65–99)
Glucose-Capillary: 156 mg/dL — ABNORMAL HIGH (ref 65–99)

## 2016-06-04 LAB — TROPONIN I
TROPONIN I: 0.94 ng/mL — AB (ref <0.031)
Troponin I: 1.08 ng/mL (ref <0.031)

## 2016-06-04 LAB — CBC
HEMATOCRIT: 29.9 % — AB (ref 39.0–52.0)
HEMOGLOBIN: 9.8 g/dL — AB (ref 13.0–17.0)
MCH: 27 pg (ref 26.0–34.0)
MCHC: 32.8 g/dL (ref 30.0–36.0)
MCV: 82.4 fL (ref 78.0–100.0)
Platelets: 270 10*3/uL (ref 150–400)
RBC: 3.63 MIL/uL — ABNORMAL LOW (ref 4.22–5.81)
RDW: 15 % (ref 11.5–15.5)
WBC: 12.4 10*3/uL — AB (ref 4.0–10.5)

## 2016-06-04 LAB — HEMOGLOBIN A1C
Hgb A1c MFr Bld: 8 % — ABNORMAL HIGH (ref 4.8–5.6)
Mean Plasma Glucose: 183 mg/dL

## 2016-06-04 LAB — PROTIME-INR
INR: 1.29 (ref 0.00–1.49)
Prothrombin Time: 16.2 seconds — ABNORMAL HIGH (ref 11.6–15.2)

## 2016-06-04 SURGERY — LEFT HEART CATH AND CORS/GRAFTS ANGIOGRAPHY

## 2016-06-04 MED ORDER — SODIUM CHLORIDE 0.9% FLUSH
3.0000 mL | Freq: Two times a day (BID) | INTRAVENOUS | Status: DC
  Administered 2016-06-04 – 2016-06-05 (×2): 3 mL via INTRAVENOUS

## 2016-06-04 MED ORDER — SODIUM CHLORIDE 0.9 % IV SOLN
250.0000 mL | INTRAVENOUS | Status: DC | PRN
  Administered 2016-06-06: 250 mL via INTRAVENOUS

## 2016-06-04 MED ORDER — SODIUM CHLORIDE 0.9 % WEIGHT BASED INFUSION
1.0000 mL/kg/h | INTRAVENOUS | Status: DC
  Administered 2016-06-06: 1 mL/kg/h via INTRAVENOUS

## 2016-06-04 MED ORDER — SODIUM CHLORIDE 0.9% FLUSH: 3.0000 mL | INTRAVENOUS | Status: DC | PRN

## 2016-06-04 MED ORDER — ASPIRIN 81 MG PO CHEW
81.0000 mg | CHEWABLE_TABLET | ORAL | Status: AC
  Administered 2016-06-04: 81 mg via ORAL
  Filled 2016-06-04: qty 1

## 2016-06-04 MED ORDER — SODIUM CHLORIDE 0.9 % WEIGHT BASED INFUSION: 3.0000 mL/kg/h | INTRAVENOUS | Status: AC

## 2016-06-04 NOTE — Progress Notes (Signed)
MD notified of pt having a fever of 102.2. Pt was also shivering about an hour earlier. Pt was given 650 mg of tylenol for the fever. Will continue to monitor the pt. Hoover Brunette, RN

## 2016-06-04 NOTE — Progress Notes (Signed)
   VASCULAR SURGERY ASSESSMENT & PLAN:   GANGRENOUS WOUNDS RIGHT FOOT WITH SEVERE INFRAINGUINAL ARTERIAL OCCLUSIVE DISEASE: I have reviewed his films and it appears that his only option for revascularization would be a femoral to peroneal bypass with prosthetic graft.I think the chances of this being successful are small and would be associated with significant risk given his severe aortic stenosis. I think the safest approach would be primary below the knee amputation.   The patient is scheduled for heart cath today.   This patient was scheduled to see Dr. Sherren Mocha Early next week and he can also render an opinion as he is on call this weekend.  SUBJECTIVE: No specific complaints  PHYSICAL EXAM: Filed Vitals:   06/03/16 1500 06/03/16 1535 06/03/16 2023 06/04/16 0516  BP: 127/62 130/63 94/54 111/59  Pulse:  91 102 88  Temp:  99.5 F (37.5 C) 100 F (37.8 C) 99.5 F (37.5 C)  TempSrc:  Oral Oral Oral  Resp: 19 18 18    Height:  5\' 10"  (1.778 m)    Weight:  188 lb 8 oz (85.503 kg)  187 lb 14.4 oz (85.231 kg)  SpO2:  97% 98% 92%   No change and dry gangrene of right fifth toe, fourth toe, and great toe. Cellulitis slightly improved.  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

## 2016-06-04 NOTE — Progress Notes (Signed)
[facility]                                                                              Patient Demographics  Anthony Holder, is a 75 y.o. male, DOB - 04/29/1941, [id number]  Admit date - 06/03/2016   Admitting Physician Albertine Patricia, MD  Outpatient Primary MD for the patient is [name], MD  Outpatient specialists:   LOS -   days    Chief Complaint  Patient presents with  . Tachycardia  . Toe Pain       Brief summary   Patient is a 75 year old male with severe PVD,with prior left BKA and known ischemic ulcers right foot, chronic systolic CHF, AS with PAH, hypertension, history of CAD/ CABG 5 in 2010, DM, BPH, dyslipidemia and history of CVA. Patient recently underwent aortogram with right lower extremity runoff and may was found to have significant disease that was not amenable to intervention or surgery and medical therapy was opted for by the vascular surgeon. The past 2 days he noticed increased darkening of the pinky toe of the right foot with pain. He also noticed palpitations. He underwent an outpatient MRI yesterday on 6/14 that showed negative findings regarding abscess or osteomyelitis.  Patient was admitted for further workup. Troponin was 0.2    Assessment & Plan   Principal Problem:  Elevated troponin/Hx of CABG x 5 2010/abnormal EKG, cardiomyopathy, palpitations -Patient without chest pain, troponin trended up to 1.08, new ST segment downsloping and T-wave inversion on EKG.  - Cardiology consulted, per Dr. 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 Appreciate vascular surgery consult, likely patient will need BKA   HTN (hypertension) -Blood pressure currently well-controlled on Tenormin   Hyperlipidemia -Continue omega-3 fatty acids   BPH (benign prostatic hypertrophy) -Monitor for acute urinary retention   History of CVA (cerebrovascular accident) -Continue aspirin -Patient with documented statin allergy  Anemia - Follow H&H and FOBT, no obvious GI bleeding  Code Status: Full CODE STATUS DVT Prophylaxis:  Lovenox  Family Communication: Discussed in detail with the patient, all imaging results, lab results explained to the patient and wife at the bedside  Disposition Plan:    Procedures:  None   Consultants:   Cardiology Vascular surgery  Antimicrobials:   IV vancomycin 6/15>  IV Zosyn 6/15>   Medications  Scheduled Meds: . aspirin EC  81 mg Oral Daily  . atenolol  50 mg Oral Daily  . baclofen  10 mg  Oral Daily  . enoxaparin (LOVENOX) injection  40 mg Subcutaneous Q24H  . furosemide  40 mg Oral BID  . gabapentin  300 mg Oral TID  . insulin aspart  0-5 Units Subcutaneous QHS  . insulin aspart  0-9 Units Subcutaneous TID WC  . insulin NPH Human  15 Units Subcutaneous BID AC & HS  . piperacillin-tazobactam (ZOSYN)  IV  3.375 g Intravenous Q8H  . potassium chloride SA  20 mEq Oral Daily  . sodium chloride flush  3 mL Intravenous Q12H  . vancomycin  1,000 mg Intravenous Q12H   Continuous Infusions: . sodium chloride 1 mL/kg/hr (06/04/16 0515)   PRN Meds:.sodium chloride, acetaminophen, gi cocktail, morphine injection, ondansetron (ZOFRAN) IV, sodium chloride  flush, traMADol   Antibiotics   Anti-infectives    Start     Dose/Rate Route Frequency Ordered Stop   06/03/16 1400  piperacillin-tazobactam (ZOSYN) IVPB 3.375 g     3.375 g 12.5 mL/hr over 240 Minutes Intravenous Every 8 hours 06/03/16 1352     06/03/16 1400  vancomycin (VANCOCIN) IVPB 1000 mg/200 mL premix     1,000 mg 200 mL/hr over 60 Minutes Intravenous Every 12 hours 06/03/16 1352          Subjective:   Anthony Holder was seen and examined today.  No specific complaints. Wife at the bedside. Patient denies dizziness, chest pain, shortness of breath, abdominal pain, N/V/D/C, new weakness, numbess, tingling. No acute events overnight.    Objective:   Filed Vitals:   06/03/16 1535 06/03/16 2023 06/04/16 0516 06/04/16 1028  BP: 130/63 94/54 111/59 115/63  Pulse: 91 102 88   Temp: 99.5 F (37.5 C) 100 F (37.8 C) 99.5 F (37.5 C)   TempSrc: Oral Oral Oral   Resp: 18 18    Height: 5\' 10"  (1.778 m)     Weight: 85.503 kg (188 lb 8 oz)  85.231 kg (187 lb 14.4 oz)   SpO2: 97% 98% 92%     Intake/Output Summary (Last 24 hours) at 06/04/16 1312 Last data filed at 06/04/16 0246  Gross per 24 hour  Intake    500 ml  Output      0 ml  Net    500 ml     Wt Readings from Last 3 Encounters:  06/04/16 85.231 kg (187 lb 14.4 oz)  05/10/16 86.183 kg (190 lb)  05/06/16 88.905 kg (196 lb)     Exam  General: Alert and oriented x 3, NAD  HEENT:    Neck: Supple, no JVD, no masses  Cardiovascular: S1 S2 auscultated, no rubs, murmurs or gallops. Regular rate and rhythm.  Respiratory: Clear to auscultation bilaterally, no wheezing, rales or rhonchi  Gastrointestinal: Soft, nontender, nondistended, + bowel sounds  Ext: no cyanosis clubbing, left BKA  Neuro: no new deficit  Skin: Right 4,5 and great toe dry gangrene with eschar, cellulitis on the surrounding area  Psych: Normal affect and demeanor, alert and oriented x3    Data Reviewed:  I have personally  reviewed following labs and imaging studies  Micro Results No results found for this or any previous visit (from the past 240 hour(s)).  Radiology Reports Dg Chest 2 View  06/03/2016  CLINICAL DATA:  Chest pain EXAM: CHEST  2 VIEW COMPARISON:  March 03, 2016 FINDINGS: There is mild scarring in the left base. Lungs elsewhere are clear. Heart is upper normal in size with pulmonary vascularity within normal limits. Patient is status post coronary artery bypass grafting.  No adenopathy. There is postoperative change in the lower cervical spine region. IMPRESSION: Scarring left base. No edema or consolidation. Stable cardiac silhouette. Electronically Signed   By: Lowella Grip III M.D.   On: 06/03/2016 13:19   Mr Foot Right W Wo Contrast  06/02/2016  CLINICAL DATA:  Nonhealing wounds at the distal aspect of the right first metatarsal and between the fourth and fifth toes for 4-5 weeks. Question abscess or osteomyelitis. EXAM: MRI OF THE RIGHT FOREFOOT WITHOUT AND WITH CONTRAST TECHNIQUE: Multiplanar, multisequence MR imaging was performed both before and after administration of intravenous contrast. CONTRAST:  18 ml MULTIHANCE GADOBENATE DIMEGLUMINE 529 MG/ML IV SOLN COMPARISON:  Plain films of the right foot 05/24/2016. FINDINGS: There may be a skin wound along the head of the first metatarsal versus artifactual loss of visualization of cutaneous tissues. Artifact is favored. No abscess is identified. No bone marrow signal abnormality to suggest osteomyelitis is seen. Mild first MTP degenerative change is seen with some associated marrow edema in the medial sesamoid. Intrinsic musculature the foot demonstrates some fatty replacement with associated mildly increased T2 signal. IMPRESSION: Negative for abscess or osteomyelitis. Mild first MTP osteoarthritis. Electronically Signed   By: Inge Rise M.D.   On: 06/02/2016 16:03   Dg Foot Complete Right  05/24/2016  CLINICAL DATA:  Nonhealing wound in the  first toe EXAM: RIGHT FOOT COMPLETE - 3+ VIEW COMPARISON:  None. FINDINGS: Mild degenerative changes of the first MTP joint are noted. No acute fracture or dislocation is seen. Mild degenerative changes are noted in the tarsal bones as well. No gross soft tissue abnormality is seen. No bony erosion to suggest osteomyelitis is noted. IMPRESSION: Degenerative change without acute bony abnormality. Electronically Signed   By: Inez Catalina M.D.   On: 05/24/2016 21:18    Lab Data:  CBC:  Recent Labs Lab 06/03/16 1116 06/04/16 0559  WBC 16.2* 12.4*  HGB 11.9* 9.8*  HCT 36.5* 29.9*  MCV 83.7 82.4  PLT 303 [id number]   Basic Metabolic Panel:  Recent Labs Lab 06/03/16 1116  NA 132*  K 4.1  CL 100*  CO2 23  GLUCOSE 233*  BUN 22*  CREATININE 1.16  CALCIUM 9.4   GFR: Estimated Creatinine Clearance: 57.7 mL/min (by C-G formula based on Cr of 1.16). Liver Function Tests: No results for input(s): AST, ALT, ALKPHOS, BILITOT, PROT, ALBUMIN in the last 168 hours. No results for input(s): LIPASE, AMYLASE in the last 168 hours. No results for input(s): AMMONIA in the last 168 hours. Coagulation Profile:  Recent Labs Lab 06/04/16 0559  INR 1.29   Cardiac Enzymes:  Recent Labs Lab 06/03/16 1825 06/03/16 2333 06/04/16 0559  TROPONINI 0.95* 0.94* 1.08*   BNP (last 3 results) No results for input(s): PROBNP in the last 8760 hours. HbA1C:  Recent Labs  06/03/16 1116  HGBA1C 8.0*   CBG:  Recent Labs Lab 06/03/16 1619 06/03/16 2127 06/04/16 0740 06/04/16 1146  GLUCAP 170* 155* 96 303*   Lipid Profile: No results for input(s): CHOL, HDL, LDLCALC, TRIG, CHOLHDL, LDLDIRECT in the last 72 hours. Thyroid Function Tests: No results for input(s): TSH, T4TOTAL, FREET4, T3FREE, THYROIDAB in the last 72 hours. Anemia Panel: No results for input(s): VITAMINB12, FOLATE, FERRITIN, TIBC, IRON, RETICCTPCT in the last 72 hours. Urine analysis:    Component Value Date/Time   COLORURINE  AMBER* 02/27/2016 1357   APPEARANCEUR CLEAR 02/27/2016 1357   LABSPEC 1.034* 02/27/2016 1357   PHURINE 5.5 02/27/2016 1357   GLUCOSEU >  1000* 02/27/2016 1357   HGBUR LARGE* 02/27/2016 1357   BILIRUBINUR SMALL* 02/27/2016 1357   KETONESUR 15* 02/27/2016 1357   PROTEINUR >300* 02/27/2016 1357   UROBILINOGEN 1.0 06/13/2015 0957   NITRITE NEGATIVE 02/27/2016 1357   LEUKOCYTESUR NEGATIVE 02/27/2016 1357     RAI,RIPUDEEP M.D. [facility] 06/04/2016, 1:12 PM  Pager: [phone] Between 7am to 7pm - call Pager - [phone]  After 7pm go to [email or web address] - password [email or web address]  Call night coverage person covering after 7pm

## 2016-06-04 NOTE — Progress Notes (Signed)
SUBJECTIVE:  No chest pain.     PHYSICAL EXAM Filed Vitals:   06/03/16 1500 06/03/16 1535 06/03/16 2023 06/04/16 0516  BP: 127/62 130/63 94/54 111/59  Pulse:  91 102 88  Temp:  99.5 F (37.5 C) 100 F (37.8 C) 99.5 F (37.5 C)  TempSrc:  Oral Oral Oral  Resp: 19 18 18    Height:  5\' 10"  (1.778 m)    Weight:  188 lb 8 oz (85.503 kg)  187 lb 14.4 oz (85.231 kg)  SpO2:  97% 98% 92%   General:  No distress Lungs:  Clear Heart:  RRR, murmur unchanged Abdomen:  Positive bowel sounds, no rebound no guarding Extremities:  Absent right DP/PT.   LABS: Lab Results  Component Value Date   TROPONINI 0.94* 06/03/2016   Results for orders placed or performed during the hospital encounter of 06/03/16 (from the past 24 hour(s))  Basic metabolic panel     Status: Abnormal   Collection Time: 06/03/16 11:16 AM  Result Value Ref Range   Sodium 132 (L) 135 - 145 mmol/L   Potassium 4.1 3.5 - 5.1 mmol/L   Chloride 100 (L) 101 - 111 mmol/L   CO2 23 22 - 32 mmol/L   Glucose, Bld 233 (H) 65 - 99 mg/dL   BUN 22 (H) 6 - 20 mg/dL   Creatinine, Ser 1.16 0.61 - 1.24 mg/dL   Calcium 9.4 8.9 - 10.3 mg/dL   GFR calc non Af Amer >60 >60 mL/min   GFR calc Af Amer >60 >60 mL/min   Anion gap 9 5 - 15  CBC     Status: Abnormal   Collection Time: 06/03/16 11:16 AM  Result Value Ref Range   WBC 16.2 (H) 4.0 - 10.5 K/uL   RBC 4.36 4.22 - 5.81 MIL/uL   Hemoglobin 11.9 (L) 13.0 - 17.0 g/dL   HCT 36.5 (L) 39.0 - 52.0 %   MCV 83.7 78.0 - 100.0 fL   MCH 27.3 26.0 - 34.0 pg   MCHC 32.6 30.0 - 36.0 g/dL   RDW 15.1 11.5 - 15.5 %   Platelets 303 150 - 400 K/uL  Hemoglobin A1c     Status: Abnormal   Collection Time: 06/03/16 11:16 AM  Result Value Ref Range   Hgb A1c MFr Bld 8.0 (H) 4.8 - 5.6 %   Mean Plasma Glucose 183 mg/dL  I-stat troponin, ED     Status: Abnormal   Collection Time: 06/03/16 11:31 AM  Result Value Ref Range   Troponin i, poc 0.22 (HH) 0.00 - 0.08 ng/mL   Comment NOTIFIED  PHYSICIAN    Comment 3          Glucose, capillary     Status: Abnormal   Collection Time: 06/03/16  4:19 PM  Result Value Ref Range   Glucose-Capillary 170 (H) 65 - 99 mg/dL  Troponin I (q 6hr x 3)     Status: Abnormal   Collection Time: 06/03/16  6:25 PM  Result Value Ref Range   Troponin I 0.95 (HH) <0.031 ng/mL  Glucose, capillary     Status: Abnormal   Collection Time: 06/03/16  9:27 PM  Result Value Ref Range   Glucose-Capillary 155 (H) 65 - 99 mg/dL  Troponin I (q 6hr x 3)     Status: Abnormal   Collection Time: 06/03/16 11:33 PM  Result Value Ref Range   Troponin I 0.94 (HH) <0.031 ng/mL    Intake/Output Summary (Last 24 hours) at  06/04/16 0631 Last data filed at 06/04/16 0246  Gross per 24 hour  Intake    500 ml  Output      0 ml  Net    500 ml     ASSESSMENT AND PLAN:  CARDIOMYOPATHY:   Needs a right and left heart cath.  However, his Hbg dropped.  This is an elective procedure.  I will cancel this for today.  Cath on Monday if his labs are stable and there has been no evidence of active bleeding.   Check stool guaiac.  (He is not yet on the cath board.)    ELEVATED TROPONIN:  As above.  Troponin still elevated but flat trend.    PVD:  On antibiotics for non healing foot wound.    HYPONATREMIA:    No BMET resulted today.  Follow per primary team.    Minus Breeding 06/04/2016 6:31 AM

## 2016-06-04 NOTE — Care Management Obs Status (Signed)
[place] NOTIFICATION   Patient Details  Name: Anthony Holder MRN: [id number] Date of Birth: Jan 23, 1941   Medicare Observation Status Notification Given:  Yes    Bethena Roys, RN 06/04/2016, 11:41 AM

## 2016-06-05 ENCOUNTER — Inpatient Hospital Stay (HOSPITAL_COMMUNITY): Payer: Medicare Other

## 2016-06-05 DIAGNOSIS — E11628 Type 2 diabetes mellitus with other skin complications: Secondary | ICD-10-CM | POA: Diagnosis present

## 2016-06-05 DIAGNOSIS — I350 Nonrheumatic aortic (valve) stenosis: Secondary | ICD-10-CM

## 2016-06-05 DIAGNOSIS — E871 Hypo-osmolality and hyponatremia: Secondary | ICD-10-CM | POA: Diagnosis not present

## 2016-06-05 DIAGNOSIS — L97519 Non-pressure chronic ulcer of other part of right foot with unspecified severity: Secondary | ICD-10-CM | POA: Diagnosis present

## 2016-06-05 DIAGNOSIS — Z89512 Acquired absence of left leg below knee: Secondary | ICD-10-CM | POA: Diagnosis not present

## 2016-06-05 DIAGNOSIS — E778 Other disorders of glycoprotein metabolism: Secondary | ICD-10-CM | POA: Diagnosis present

## 2016-06-05 DIAGNOSIS — J449 Chronic obstructive pulmonary disease, unspecified: Secondary | ICD-10-CM | POA: Diagnosis not present

## 2016-06-05 DIAGNOSIS — I6523 Occlusion and stenosis of bilateral carotid arteries: Secondary | ICD-10-CM | POA: Diagnosis not present

## 2016-06-05 DIAGNOSIS — Z951 Presence of aortocoronary bypass graft: Secondary | ICD-10-CM

## 2016-06-05 DIAGNOSIS — I130 Hypertensive heart and chronic kidney disease with heart failure and stage 1 through stage 4 chronic kidney disease, or unspecified chronic kidney disease: Secondary | ICD-10-CM | POA: Diagnosis present

## 2016-06-05 DIAGNOSIS — Z87891 Personal history of nicotine dependence: Secondary | ICD-10-CM | POA: Diagnosis not present

## 2016-06-05 DIAGNOSIS — Z7984 Long term (current) use of oral hypoglycemic drugs: Secondary | ICD-10-CM | POA: Diagnosis not present

## 2016-06-05 DIAGNOSIS — K219 Gastro-esophageal reflux disease without esophagitis: Secondary | ICD-10-CM | POA: Diagnosis present

## 2016-06-05 DIAGNOSIS — R7989 Other specified abnormal findings of blood chemistry: Secondary | ICD-10-CM | POA: Diagnosis not present

## 2016-06-05 DIAGNOSIS — I5022 Chronic systolic (congestive) heart failure: Secondary | ICD-10-CM | POA: Diagnosis not present

## 2016-06-05 DIAGNOSIS — I77810 Thoracic aortic ectasia: Secondary | ICD-10-CM | POA: Diagnosis present

## 2016-06-05 DIAGNOSIS — R0602 Shortness of breath: Secondary | ICD-10-CM | POA: Diagnosis not present

## 2016-06-05 DIAGNOSIS — Z4889 Encounter for other specified surgical aftercare: Secondary | ICD-10-CM | POA: Diagnosis not present

## 2016-06-05 DIAGNOSIS — I2510 Atherosclerotic heart disease of native coronary artery without angina pectoris: Secondary | ICD-10-CM | POA: Diagnosis not present

## 2016-06-05 DIAGNOSIS — M6281 Muscle weakness (generalized): Secondary | ICD-10-CM | POA: Diagnosis not present

## 2016-06-05 DIAGNOSIS — E876 Hypokalemia: Secondary | ICD-10-CM | POA: Diagnosis present

## 2016-06-05 DIAGNOSIS — I255 Ischemic cardiomyopathy: Secondary | ICD-10-CM | POA: Diagnosis present

## 2016-06-05 DIAGNOSIS — Z8673 Personal history of transient ischemic attack (TIA), and cerebral infarction without residual deficits: Secondary | ICD-10-CM | POA: Diagnosis not present

## 2016-06-05 DIAGNOSIS — D638 Anemia in other chronic diseases classified elsewhere: Secondary | ICD-10-CM | POA: Diagnosis present

## 2016-06-05 DIAGNOSIS — E1140 Type 2 diabetes mellitus with diabetic neuropathy, unspecified: Secondary | ICD-10-CM | POA: Diagnosis not present

## 2016-06-05 DIAGNOSIS — E7800 Pure hypercholesterolemia, unspecified: Secondary | ICD-10-CM | POA: Diagnosis present

## 2016-06-05 DIAGNOSIS — I25700 Atherosclerosis of coronary artery bypass graft(s), unspecified, with unstable angina pectoris: Secondary | ICD-10-CM | POA: Diagnosis not present

## 2016-06-05 DIAGNOSIS — I5020 Unspecified systolic (congestive) heart failure: Secondary | ICD-10-CM | POA: Diagnosis not present

## 2016-06-05 DIAGNOSIS — N183 Chronic kidney disease, stage 3 (moderate): Secondary | ICD-10-CM | POA: Diagnosis present

## 2016-06-05 DIAGNOSIS — Z7982 Long term (current) use of aspirin: Secondary | ICD-10-CM | POA: Diagnosis not present

## 2016-06-05 DIAGNOSIS — Z85828 Personal history of other malignant neoplasm of skin: Secondary | ICD-10-CM | POA: Diagnosis not present

## 2016-06-05 DIAGNOSIS — R2681 Unsteadiness on feet: Secondary | ICD-10-CM | POA: Diagnosis not present

## 2016-06-05 DIAGNOSIS — I96 Gangrene, not elsewhere classified: Secondary | ICD-10-CM | POA: Diagnosis not present

## 2016-06-05 DIAGNOSIS — E11622 Type 2 diabetes mellitus with other skin ulcer: Secondary | ICD-10-CM | POA: Diagnosis present

## 2016-06-05 DIAGNOSIS — Z981 Arthrodesis status: Secondary | ICD-10-CM | POA: Diagnosis not present

## 2016-06-05 DIAGNOSIS — M86171 Other acute osteomyelitis, right ankle and foot: Secondary | ICD-10-CM | POA: Diagnosis not present

## 2016-06-05 DIAGNOSIS — L03115 Cellulitis of right lower limb: Secondary | ICD-10-CM | POA: Diagnosis present

## 2016-06-05 DIAGNOSIS — E11621 Type 2 diabetes mellitus with foot ulcer: Secondary | ICD-10-CM | POA: Diagnosis present

## 2016-06-05 DIAGNOSIS — D509 Iron deficiency anemia, unspecified: Secondary | ICD-10-CM | POA: Diagnosis present

## 2016-06-05 DIAGNOSIS — M4806 Spinal stenosis, lumbar region: Secondary | ICD-10-CM | POA: Diagnosis not present

## 2016-06-05 DIAGNOSIS — I272 Other secondary pulmonary hypertension: Secondary | ICD-10-CM | POA: Diagnosis present

## 2016-06-05 DIAGNOSIS — N400 Enlarged prostate without lower urinary tract symptoms: Secondary | ICD-10-CM | POA: Diagnosis not present

## 2016-06-05 DIAGNOSIS — Z79899 Other long term (current) drug therapy: Secondary | ICD-10-CM | POA: Diagnosis not present

## 2016-06-05 DIAGNOSIS — E1152 Type 2 diabetes mellitus with diabetic peripheral angiopathy with gangrene: Secondary | ICD-10-CM | POA: Diagnosis not present

## 2016-06-05 DIAGNOSIS — Z794 Long term (current) use of insulin: Secondary | ICD-10-CM | POA: Diagnosis not present

## 2016-06-05 DIAGNOSIS — E1122 Type 2 diabetes mellitus with diabetic chronic kidney disease: Secondary | ICD-10-CM | POA: Diagnosis not present

## 2016-06-05 DIAGNOSIS — I5023 Acute on chronic systolic (congestive) heart failure: Secondary | ICD-10-CM | POA: Diagnosis not present

## 2016-06-05 DIAGNOSIS — N182 Chronic kidney disease, stage 2 (mild): Secondary | ICD-10-CM | POA: Diagnosis not present

## 2016-06-05 DIAGNOSIS — M79609 Pain in unspecified limb: Secondary | ICD-10-CM | POA: Diagnosis not present

## 2016-06-05 DIAGNOSIS — I739 Peripheral vascular disease, unspecified: Secondary | ICD-10-CM | POA: Diagnosis not present

## 2016-06-05 LAB — CBC
HEMATOCRIT: 29.6 % — AB (ref 39.0–52.0)
HEMOGLOBIN: 9.7 g/dL — AB (ref 13.0–17.0)
MCH: 26.7 pg (ref 26.0–34.0)
MCHC: 32.8 g/dL (ref 30.0–36.0)
MCV: 81.5 fL (ref 78.0–100.0)
Platelets: 254 10*3/uL (ref 150–400)
RBC: 3.63 MIL/uL — AB (ref 4.22–5.81)
RDW: 15 % (ref 11.5–15.5)
WBC: 12.4 10*3/uL — AB (ref 4.0–10.5)

## 2016-06-05 LAB — BASIC METABOLIC PANEL
ANION GAP: 10 (ref 5–15)
BUN: 16 mg/dL (ref 6–20)
CHLORIDE: 99 mmol/L — AB (ref 101–111)
CO2: 23 mmol/L (ref 22–32)
Calcium: 8.3 mg/dL — ABNORMAL LOW (ref 8.9–10.3)
Creatinine, Ser: 1.11 mg/dL (ref 0.61–1.24)
GFR calc Af Amer: >60 mL/min (ref >60)
GLUCOSE: 121 mg/dL — AB (ref 65–99)
POTASSIUM: 3.4 mmol/L — AB (ref 3.5–5.1)
Sodium: 132 mmol/L — ABNORMAL LOW (ref 135–145)

## 2016-06-05 LAB — IRON AND TIBC
Iron: 22 ug/dL — ABNORMAL LOW (ref 45–182)
SATURATION RATIOS: 12 % — AB (ref 17.9–39.5)
TIBC: 183 ug/dL — ABNORMAL LOW (ref 250–450)
UIBC: 161 ug/dL

## 2016-06-05 LAB — VITAMIN B12: VITAMIN B 12: 485 pg/mL (ref 180–914)

## 2016-06-05 LAB — GLUCOSE, CAPILLARY
GLUCOSE-CAPILLARY: 260 mg/dL — AB (ref 65–99)
Glucose-Capillary: 120 mg/dL — ABNORMAL HIGH (ref 65–99)
Glucose-Capillary: 200 mg/dL — ABNORMAL HIGH (ref 65–99)
Glucose-Capillary: 227 mg/dL — ABNORMAL HIGH (ref 65–99)

## 2016-06-05 LAB — FERRITIN: Ferritin: 463 ng/mL — ABNORMAL HIGH (ref 24–336)

## 2016-06-05 LAB — RETICULOCYTES
RBC.: 4.13 MIL/uL — AB (ref 4.22–5.81)
RETIC COUNT ABSOLUTE: 41.3 10*3/uL (ref 19.0–186.0)
RETIC CT PCT: 1 % (ref 0.4–3.1)

## 2016-06-05 LAB — FOLATE: Folate: 21.4 ng/mL (ref >5.9)

## 2016-06-05 LAB — PROTIME-INR
INR: 1.36 (ref 0.00–1.49)
PROTHROMBIN TIME: 16.9 s — AB (ref 11.6–15.2)

## 2016-06-05 LAB — VANCOMYCIN, TROUGH: VANCOMYCIN TR: 17 ug/mL (ref 10.0–20.0)

## 2016-06-05 NOTE — Progress Notes (Signed)
[facility]                                                                              Patient Demographics  Anthony Holder, is a 75 y.o. male, DOB - 02-25-1941, [id number]  Admit date - 06/03/2016   Admitting Physician Albertine Patricia, MD  Outpatient Primary MD for the patient is [name], MD  Outpatient specialists:   LOS -   days    Chief Complaint  Patient presents with  . Tachycardia  . Toe Pain       Brief summary   Patient is a 75 year old male with severe PVD,with prior left BKA and known ischemic ulcers right foot, chronic systolic CHF, AS with PAH, hypertension, history of CAD/ CABG 5 in 2010, DM, BPH, dyslipidemia and history of CVA. Patient recently underwent aortogram with right lower extremity runoff and may was found to have significant disease that was not amenable to intervention or surgery and medical therapy was opted for by the vascular surgeon. The past 2 days he noticed increased darkening of the pinky toe of the right foot with pain. He also noticed palpitations. He underwent an outpatient MRI yesterday on 6/14 that showed negative findings regarding abscess or osteomyelitis.  Patient was admitted for further workup. Troponin was 0.2  I assumed his care on 06/05/2016.   Assessment & Plan   Elevated troponin/Hx of CABG x 5 2010/abnormal EKG, cardiomyopathy, palpitations - Patient without chest pain, troponin trended up to 1.08, new ST segment downsloping and T-wave inversion on EKG. Cardiology consulted, per Dr. Percival Spanish, needs a right and left cardiac cath, Cardiology following. Currently chest pain-free, on aspirin, beta blocker for secondary prevention, has statin allergy.  Aortic stenosis, moderate/Pulmonary hypertension - has a loud murmur, per echo moderate to severe stenosis, cardiology following defer management to cardiology.   Chronic systolic congestive heart failure, NYHA class 1 EF 25%. With moderate to  severe AS. Currently symptom-free, blood pressure is borderline, on beta blocker and Lasix, not on ACE inhibitor or ARB due to soft blood pressures and moderate to severe AS.  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 Surrounding cellulitis  - Continue IV vancomycin and Zosyn. Vascular surgery following, may require right BKA, did spike a temperature 102 on 06/04/2016. Blood cultures have been ordered on 06/05/2016 he is febrile again. Discussed with Dr. early vascular surgeon on 06/05/2016.  HTN (hypertension) -Blood pressure currently well-controlled on Tenormin  Hyperlipidemia -Continue omega-3 fatty acids  BPH (benign prostatic hypertrophy)-Monitor for acute urinary retention  History of CVA (cerebrovascular accident)-Continue aspirin, Patient with documented statin allergy  Anemia - likely AOCD -  Follow H&H and FOBT, check Anemia panel.  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 Wall thickness was normal. Systolic function was severely reduced. The estimated ejection fraction was in the range of 25% to 30%. Akinesis of the inferior myocardium. - Aortic valve: There was moderate to severe stenosis. Valve area (VTI): 0.66 cm^2. Valve area (Vmax): 0.62 cm^2. Valve area (Vmean): 0.58 cm^2. - Left atrium: The atrium was mildly dilated. - Pulmonary arteries:  Systolic pressure was moderately increased. PA peak pressure: 54 mm Hg (S).   MR R foot - no osteomyelitis or abscess.    Consultants:   Cardiology Vascular surgery  Antimicrobials:   IV vancomycin 6/15>  IV Zosyn 6/15>   Medications  Scheduled Meds: . aspirin EC  81 mg Oral Daily  . atenolol  50 mg Oral Daily  . baclofen  10 mg Oral Daily  . enoxaparin (LOVENOX) injection  40 mg Subcutaneous Q24H  . furosemide  40 mg Oral BID  . gabapentin  300 mg Oral TID  . insulin aspart  0-5 Units Subcutaneous QHS  . insulin aspart  0-9 Units Subcutaneous TID WC  . insulin NPH Human  15 Units Subcutaneous BID AC & HS  . piperacillin-tazobactam (ZOSYN)  IV  3.375 g Intravenous Q8H  . potassium chloride SA  20 mEq Oral Daily  . sodium chloride flush  3 mL Intravenous Q12H  . vancomycin  1,000 mg Intravenous Q12H   Continuous Infusions: . sodium chloride 1 mL/kg/hr (06/04/16 0515)   PRN Meds:.sodium chloride, acetaminophen, gi cocktail, morphine injection, ondansetron (ZOFRAN) IV, sodium chloride flush, traMADol   Antibiotics   Anti-infectives    Start     Dose/Rate Route Frequency Ordered Stop   06/03/16 1400  piperacillin-tazobactam (ZOSYN) IVPB 3.375 g     3.375 g 12.5 mL/hr over 240 Minutes Intravenous Every 8 hours 06/03/16 1352     06/03/16 1400  vancomycin (VANCOCIN) IVPB 1000 mg/200 mL premix     1,000 mg 200 mL/hr over 60 Minutes Intravenous Every 12 hours 06/03/16 1352          Subjective:   Anthony Holder  .    Objective:   Filed Vitals:   06/04/16 1924 06/04/16 2229 06/05/16 0459 06/05/16 0938  BP: 100/54  119/55 126/65  Pulse:   90   Temp: 100.2 F (37.9 C) 98 F (36.7 C) 98.4 F (36.9 C) 98.4 F (36.9 C)  TempSrc: Oral  Oral Oral  Resp: 16  16   Height:      Weight:   84.913 kg (187 lb 3.2 oz)   SpO2: 99%  91%     Intake/Output Summary (Last 24 hours) at 06/05/16 1032 Last data filed at 06/05/16 0100  Gross per 24 hour  Intake   2080 ml   Output    150 ml  Net   1930 ml     Wt Readings from Last 3 Encounters:  06/05/16 84.913 kg (187 lb 3.2 oz)  05/10/16 86.183 kg (190 lb)  05/06/16 88.905 kg (196 lb)     Exam  General: Alert and oriented x 3, NAD  HEENT:    Neck: Supple, no JVD, no masses  Cardiovascular: S1 S2 auscultated, no rubs, Loud aortic systolic murmur, Regular rate and rhythm.  Respiratory: Clear to auscultation bilaterally, no wheezing, rales or rhonchi  Gastrointestinal:  Soft, nontender, nondistended, + bowel sounds  Ext: no cyanosis clubbing, left BKA  Neuro: no new deficit  Skin: Right 4,5 and great toe dry gangrene with eschar, cellulitis on the surrounding area  Psych: Normal affect and demeanor, alert and oriented x3    Data Reviewed:  I have personally reviewed following labs and imaging studies  Micro Results No results found for this or any previous visit (from the past 240 hour(s)).  Radiology Reports Dg Chest 2 View  06/03/2016  CLINICAL DATA:  Chest pain EXAM: CHEST  2 VIEW COMPARISON:  March 03, 2016 FINDINGS: There is mild scarring in the left base. Lungs elsewhere are clear. Heart is upper normal in size with pulmonary vascularity within normal limits. Patient is status post coronary artery bypass grafting. No adenopathy. There is postoperative change in the lower cervical spine region. IMPRESSION: Scarring left base. No edema or consolidation. Stable cardiac silhouette. Electronically Signed   By: Lowella Grip III M.D.   On: 06/03/2016 13:19   Mr Foot Right W Wo Contrast  06/02/2016  CLINICAL DATA:  Nonhealing wounds at the distal aspect of the right first metatarsal and between the fourth and fifth toes for 4-5 weeks. Question abscess or osteomyelitis. EXAM: MRI OF THE RIGHT FOREFOOT WITHOUT AND WITH CONTRAST TECHNIQUE: Multiplanar, multisequence MR imaging was performed both before and after administration of intravenous contrast. CONTRAST:  18 ml MULTIHANCE GADOBENATE  DIMEGLUMINE 529 MG/ML IV SOLN COMPARISON:  Plain films of the right foot 05/24/2016. FINDINGS: There may be a skin wound along the head of the first metatarsal versus artifactual loss of visualization of cutaneous tissues. Artifact is favored. No abscess is identified. No bone marrow signal abnormality to suggest osteomyelitis is seen. Mild first MTP degenerative change is seen with some associated marrow edema in the medial sesamoid. Intrinsic musculature the foot demonstrates some fatty replacement with associated mildly increased T2 signal. IMPRESSION: Negative for abscess or osteomyelitis. Mild first MTP osteoarthritis. Electronically Signed   By: Inge Rise M.D.   On: 06/02/2016 16:03   Dg Foot Complete Right  05/24/2016  CLINICAL DATA:  Nonhealing wound in the first toe EXAM: RIGHT FOOT COMPLETE - 3+ VIEW COMPARISON:  None. FINDINGS: Mild degenerative changes of the first MTP joint are noted. No acute fracture or dislocation is seen. Mild degenerative changes are noted in the tarsal bones as well. No gross soft tissue abnormality is seen. No bony erosion to suggest osteomyelitis is noted. IMPRESSION: Degenerative change without acute bony abnormality. Electronically Signed   By: Inez Catalina M.D.   On: 05/24/2016 21:18     Lab Data:  CBC:  Recent Labs Lab 06/03/16 1116 06/04/16 0559 06/05/16 0638  WBC 16.2* 12.4* 12.4*  HGB 11.9* 9.8* 9.7*  HCT 36.5* 29.9* 29.6*  MCV 83.7 82.4 81.5  PLT 303 270 0000000   Basic Metabolic Panel:  Recent Labs Lab 06/03/16 1116 06/05/16 0638  NA 132* 132*  K 4.1 3.4*  CL 100* 99*  CO2 23 23  GLUCOSE 233* 121*  BUN 22* 16  CREATININE 1.16 1.11  CALCIUM 9.4 8.3*   GFR: Estimated Creatinine Clearance: 60.3 mL/min (by C-G formula based on Cr of 1.11). Liver Function Tests: No results for input(s): AST, ALT, ALKPHOS, BILITOT, PROT, ALBUMIN in the last 168 hours. No results for input(s): LIPASE, AMYLASE in the last 168 hours. No results for  input(s): AMMONIA in the last 168 hours. Coagulation Profile:  Recent Labs Lab 06/04/16 0559  INR 1.29  Cardiac Enzymes:  Recent Labs Lab 06/03/16 1825 06/03/16 2333 06/04/16 0559  TROPONINI 0.95* 0.94* 1.08*   BNP (last 3 results) No results for input(s): PROBNP in the last 8760 hours. HbA1C:  Recent Labs  06/03/16 1116  HGBA1C 8.0*   CBG:  Recent Labs Lab 06/04/16 0740 06/04/16 1146 06/04/16 1642 06/04/16 2203 06/05/16 0738  GLUCAP 96 303* 200* 156* 120*   Lipid Profile: No results for input(s): CHOL, HDL, LDLCALC, TRIG, CHOLHDL, LDLDIRECT in the last 72 hours. Thyroid Function Tests: No results for input(s): TSH, T4TOTAL, FREET4, T3FREE, THYROIDAB in the last 72 hours. Anemia Panel: No results for input(s): VITAMINB12, FOLATE, FERRITIN, TIBC, IRON, RETICCTPCT in the last 72 hours. Urine analysis:    Component Value Date/Time   COLORURINE AMBER* 02/27/2016 1357   APPEARANCEUR CLEAR 02/27/2016 1357   LABSPEC 1.034* 02/27/2016 1357   PHURINE 5.5 02/27/2016 1357   GLUCOSEU >1000* 02/27/2016 1357   HGBUR LARGE* 02/27/2016 1357   BILIRUBINUR SMALL* 02/27/2016 1357   KETONESUR 15* 02/27/2016 1357   PROTEINUR >300* 02/27/2016 1357   UROBILINOGEN 1.0 06/13/2015 0957   NITRITE NEGATIVE 02/27/2016 1357   LEUKOCYTESUR NEGATIVE 02/27/2016 1357    Signature  Lala Lund K M.D on 06/05/2016 at 10:32 AM  Between 7am to 7pm - Pager - [phone], After 7pm go to [email or web address] - password [facility]  [facility]  - Office  [phone]

## 2016-06-05 NOTE — Progress Notes (Signed)
While giving morning medications, patient made RN aware that he had been taking his home medications (neurontin, lasix, aspirin) because he was told yesterday by a staff member that it was ok due to his "observation status". Patient was upset that his insurance wouldn't pay for all his medications that he had already bought so he wanted to take his own. RN advised patient that this was not our policy. Patient continued to take his home medications throughout the day despite hospital policy; nurse noted medications that patient was taking. Charge nurse aware.

## 2016-06-05 NOTE — Progress Notes (Addendum)
Patient ID: Anthony Holder, male   DOB: 11/24/1941, 75 y.o.   MRN: [id number]    SUBJECTIVE:  No chest pain.     PHYSICAL EXAM Filed Vitals:   06/04/16 1924 06/04/16 2229 06/05/16 0459 06/05/16 0938  BP: 100/54  119/55 126/65  Pulse:   90   Temp: 100.2 F (37.9 C) 98 F (36.7 C) 98.4 F (36.9 C) 98.4 F (36.9 C)  TempSrc: Oral  Oral Oral  Resp: 16  16   Height:      Weight:   187 lb 3.2 oz (84.913 kg)   SpO2: 99%  91%    Affect appropriate Pale chronically ill male  HEENT: normal Neck supple with no adenopathy JVP normal Right CEA  no thyromegaly Lungs clear with no wheezing and good diaphragmatic motion Heart:  S1/S2 AS  murmur, no rub, gallop or click PMI normal Abdomen: benighn, BS positve, no tenderness, no AAA  No HSM or HJR Decreased right femoral pulse.  Left BKA No edema Neuro non-focal Skin warm and dry No muscular weakness   LABS: Lab Results  Component Value Date   TROPONINI 1.08* 06/04/2016   Results for orders placed or performed during the hospital encounter of 06/03/16 (from the past 24 hour(s))  Glucose, capillary     Status: Abnormal   Collection Time: 06/04/16 11:46 AM  Result Value Ref Range   Glucose-Capillary 303 (H) 65 - 99 mg/dL  Glucose, capillary     Status: Abnormal   Collection Time: 06/04/16  4:42 PM  Result Value Ref Range   Glucose-Capillary 200 (H) 65 - 99 mg/dL  Glucose, capillary     Status: Abnormal   Collection Time: 06/04/16 10:03 PM  Result Value Ref Range   Glucose-Capillary 156 (H) 65 - 99 mg/dL  CBC     Status: Abnormal   Collection Time: 06/05/16  6:38 AM  Result Value Ref Range   WBC 12.4 (H) 4.0 - 10.5 K/uL   RBC 3.63 (L) 4.22 - 5.81 MIL/uL   Hemoglobin 9.7 (L) 13.0 - 17.0 g/dL   HCT 29.6 (L) 39.0 - 52.0 %   MCV 81.5 78.0 - 100.0 fL   MCH 26.7 26.0 - 34.0 pg   MCHC 32.8 30.0 - 36.0 g/dL   RDW 15.0 11.5 - 15.5 %   Platelets 254 150 - 400 K/uL  Basic metabolic panel     Status: Abnormal   Collection Time:  06/05/16  6:38 AM  Result Value Ref Range   Sodium 132 (L) 135 - 145 mmol/L   Potassium 3.4 (L) 3.5 - 5.1 mmol/L   Chloride 99 (L) 101 - 111 mmol/L   CO2 23 22 - 32 mmol/L   Glucose, Bld 121 (H) 65 - 99 mg/dL   BUN 16 6 - 20 mg/dL   Creatinine, Ser 1.11 0.61 - 1.24 mg/dL   Calcium 8.3 (L) 8.9 - 10.3 mg/dL   GFR calc non Af Amer >60 >60 mL/min   GFR calc Af Amer >60 >60 mL/min   Anion gap 10 5 - 15  Glucose, capillary     Status: Abnormal   Collection Time: 06/05/16  7:38 AM  Result Value Ref Range   Glucose-Capillary 120 (H) 65 - 99 mg/dL   Comment 1 Notify RN    Comment 2 Document in Chart     Intake/Output Summary (Last 24 hours) at 06/05/16 1046 Last data filed at 06/05/16 0100  Gross per 24 hour  Intake   2080 ml  Output    150 ml  Net   1930 ml     ASSESSMENT AND PLAN:  CARDIOMYOPATHY:   Distant CABG  SSCP likely need for vascular surgery or Right BKA with difficult rehab given previous left BKA Troponin still elevated but flat trend.  Right and left cath Monday Hb low but stable can do cath from left wrist   PVD:  On antibiotics for non healing foot wound. Dr Donnetta Hutching has seen ? Refer to [facility] for high risk LE limb salvage    CHF/AS:  Impressive murmur and able to generate mean gradient of 36 mmHg despite EF 25-30% I wonder if he might be a TAVR candidate Will have to assess after cath   Carotid: previous RCEA needs repeat carotid duplex none done in over 3 years   Jenkins Rouge 06/05/2016 10:46 AM

## 2016-06-05 NOTE — Progress Notes (Signed)
Text paged [facility] ( Dr. Rogue Bussing) Re; Elevated tempt 100.4-101.9, will give Tylenol and placed order for blood cultures x 2 as per previous order and notified lab. I also let Dr Rogue Bussing know that he was going to have blood cultures done and Tylenol was being given and he agreed, no other orders received, will continue to monitor.

## 2016-06-05 NOTE — Progress Notes (Signed)
Subjective: Interval History: none.. Patient examined and chart reviewed. Known to me from prior endarterectomy at the same setting with coronary bypass grafting in 2010. He is status post left below-knee amputation with Dr. Doran Durand for diabetic foot infection. He had presented several weeks ago with blistering over the medial arch of his right foot. Had had ongoing wound care and developed dry gangrene of his fifth toe as well. Underwent outpatient arteriography and I reviewed these films from 05/10/2016. Had extensive disease throughout his SFA popliteal and tibial vessels with the peroneal being the dominant runoff into collateralization of his foot. Was felt that he had a very good chance this would heal this with the current flow and was scheduled for outpatient follow-up. He presented with cardiac issues and some worsening of his foot and is admitted for this.  Objective: Vital signs in last 24 hours: Temp:  [98 F (36.7 C)-102.2 F (39 C)] 98.4 F (36.9 C) (06/17 0459) Pulse Rate:  [82-90] 90 (06/17 0459) Resp:  [16] 16 (06/17 0459) BP: (100-148)/(54-66) 119/55 mmHg (06/17 0459) SpO2:  [91 %-99 %] 91 % (06/17 0459) Weight:  [187 lb 3.2 oz (84.913 kg)] 187 lb 3.2 oz (84.913 kg) (06/17 0459)  Intake/Output from previous day: 06/16 0701 - 06/17 0700 In: 2080 [P.O.:1930; IV Piggyback:150] Out: 150 [Urine:150] Intake/Output this shift:    Easily palpable right femoral pulse. Absent popliteal pulse. Does have eschar with no surrounding erythema in the medial arch of his foot. Dry gangrene of his fifth toe with no evidence of erythema as well.  Lab Results:  Recent Labs  06/04/16 0559 06/05/16 0638  WBC 12.4* 12.4*  HGB 9.8* 9.7*  HCT 29.9* 29.6*  PLT 270 254   BMET  Recent Labs  06/03/16 1116 06/05/16 0638  NA 132* 132*  K 4.1 3.4*  CL 100* 99*  CO2 23 23  GLUCOSE 233* 121*  BUN 22* 16  CREATININE 1.16 1.11  CALCIUM 9.4 8.3*    Studies/Results: Dg Chest 2  View  06/03/2016  CLINICAL DATA:  Chest pain EXAM: CHEST  2 VIEW COMPARISON:  March 03, 2016 FINDINGS: There is mild scarring in the left base. Lungs elsewhere are clear. Heart is upper normal in size with pulmonary vascularity within normal limits. Patient is status post coronary artery bypass grafting. No adenopathy. There is postoperative change in the lower cervical spine region. IMPRESSION: Scarring left base. No edema or consolidation. Stable cardiac silhouette. Electronically Signed   By: Lowella Grip III M.D.   On: 06/03/2016 13:19   Mr Foot Right W Wo Contrast  06/02/2016  CLINICAL DATA:  Nonhealing wounds at the distal aspect of the right first metatarsal and between the fourth and fifth toes for 4-5 weeks. Question abscess or osteomyelitis. EXAM: MRI OF THE RIGHT FOREFOOT WITHOUT AND WITH CONTRAST TECHNIQUE: Multiplanar, multisequence MR imaging was performed both before and after administration of intravenous contrast. CONTRAST:  18 ml MULTIHANCE GADOBENATE DIMEGLUMINE 529 MG/ML IV SOLN COMPARISON:  Plain films of the right foot 05/24/2016. FINDINGS: There may be a skin wound along the head of the first metatarsal versus artifactual loss of visualization of cutaneous tissues. Artifact is favored. No abscess is identified. No bone marrow signal abnormality to suggest osteomyelitis is seen. Mild first MTP degenerative change is seen with some associated marrow edema in the medial sesamoid. Intrinsic musculature the foot demonstrates some fatty replacement with associated mildly increased T2 signal. IMPRESSION: Negative for abscess or osteomyelitis. Mild first MTP osteoarthritis. Electronically Signed  By: Inge Rise M.D.   On: 06/02/2016 16:03   Dg Foot Complete Right  05/24/2016  CLINICAL DATA:  Nonhealing wound in the first toe EXAM: RIGHT FOOT COMPLETE - 3+ VIEW COMPARISON:  None. FINDINGS: Mild degenerative changes of the first MTP joint are noted. No acute fracture or dislocation is  seen. Mild degenerative changes are noted in the tarsal bones as well. No gross soft tissue abnormality is seen. No bony erosion to suggest osteomyelitis is noted. IMPRESSION: Degenerative change without acute bony abnormality. Electronically Signed   By: Inez Catalina M.D.   On: 05/24/2016 21:18   Anti-infectives: Anti-infectives    Start     Dose/Rate Route Frequency Ordered Stop   06/03/16 1400  piperacillin-tazobactam (ZOSYN) IVPB 3.375 g     3.375 g 12.5 mL/hr over 240 Minutes Intravenous Every 8 hours 06/03/16 1352     06/03/16 1400  vancomycin (VANCOCIN) IVPB 1000 mg/200 mL premix     1,000 mg 200 mL/hr over 60 Minutes Intravenous Every 12 hours 06/03/16 1352        Assessment/Plan: s/p Procedure(s): Left Heart Cath and Cors/Grafts Angiography (N/A) I had a very long discussion with the patient and his wife present. Explained that he has had progressive tissue loss despite his current level of flow. I do not feel he is a candidate for femoral to peroneal bypass since he does not have any vein conduit. Feel that his risk from a femoral to peroneal bypass with prosthetic Gore-Tex graft with much greater than any option chance for limb salvage. He does have significant cardiac disease and is undergoing further workup of this as well. I spoke with Dr. Candiss Norse as well. Patient has had the positive blood cultures for MRSA. At this certainly does not clinically appear to have a septic foot and the x-rays showed no evidence of characteristics for osteo. Continue to follow with you. Explained to the patient very high risk for needing below-knee amputation. Might be a candidate for aggressive endovascular treatment of his superficial femoral and popliteal stenosis and will make further recommendations pending cardiac evaluation     Maxine Fredman 06/05/2016, 9:34 AM

## 2016-06-05 NOTE — Progress Notes (Signed)
Report received via Donnella Bi RN in patient's room using SBAR format, reviewed VS, meds, tests, PMH and patient's general condition, assumed care of patient.

## 2016-06-05 NOTE — Progress Notes (Signed)
Pharmacy Antibiotic Note  Jayvian Pesta is a 75 y.o. male admitted on 06/03/2016 with foot infection/cellulitis.  Pharmacy has been consulted for vancomycin and Zosyn dosing.  Patient has dry gangrene in multiple  LE digits.  His renal function has been stable.  Patient's vancomycin trough was drawn ~1.5 hours late, but would expect it to be therapeutic nonetheless.   Plan: Continue vanc 1gm IV Q12H Continue Zosyn 3.375gm IV Q8H, 4 hr infusion Monitor renal fxn, clinical progress, repeat VT to r/o accumulation  Height: 5\' 10"  (177.8 cm) Weight: 187 lb 3.2 oz (84.913 kg) IBW/kg (Calculated) : 73  Temp (24hrs), Avg:99.5 F (37.5 C), Min:98 F (36.7 C), Max:102.2 F (39 C)   Recent Labs Lab 06/03/16 1116 06/04/16 0559 06/05/16 0638 06/05/16 1441  WBC 16.2* 12.4* 12.4*  --   CREATININE 1.16  --  1.11  --   VANCOTROUGH  --   --   --  17    Estimated Creatinine Clearance: 60.3 mL/min (by C-G formula based on Cr of 1.11).    Allergies  Allergen Reactions  . Statins Other (See Comments)    Patient prefers to not take statins    Antimicrobials this admission: Vanc 6/15>> Zosyn 6/15>>  Dose adjustments this admission: 6/17 VT = 17 mcg/mL (drawn ~1.5 hrs late) > no change  Microbiology results: N/A   Robie Oats D. Mina Marble, PharmD, BCPS Pager:  [phone] 06/05/2016, 4:23 PM

## 2016-06-06 LAB — URINE MICROSCOPIC-ADD ON

## 2016-06-06 LAB — BASIC METABOLIC PANEL
ANION GAP: 10 (ref 5–15)
ANION GAP: 9 (ref 5–15)
BUN: 17 mg/dL (ref 6–20)
BUN: 17 mg/dL (ref 6–20)
CALCIUM: 8.5 mg/dL — AB (ref 8.9–10.3)
CALCIUM: 8.2 mg/dL — AB (ref 8.9–10.3)
CO2: 26 mmol/L (ref 22–32)
CO2: 24 mmol/L (ref 22–32)
CREATININE: 1.28 mg/dL — AB (ref 0.61–1.24)
CREATININE: 1.24 mg/dL (ref 0.61–1.24)
Chloride: 96 mmol/L — ABNORMAL LOW (ref 101–111)
Chloride: 96 mmol/L — ABNORMAL LOW (ref 101–111)
GFR calc Af Amer: >60 mL/min (ref >60)
GFR calc non Af Amer: 53 mL/min — ABNORMAL LOW (ref >60)
GFR, EST NON AFRICAN AMERICAN: 56 mL/min — AB (ref >60)
Glucose, Bld: 206 mg/dL — ABNORMAL HIGH (ref 65–99)
Glucose, Bld: 200 mg/dL — ABNORMAL HIGH (ref 65–99)
POTASSIUM: 3.5 mmol/L (ref 3.5–5.1)
Potassium: 3.5 mmol/L (ref 3.5–5.1)
Sodium: 132 mmol/L — ABNORMAL LOW (ref 135–145)
Sodium: 129 mmol/L — ABNORMAL LOW (ref 135–145)

## 2016-06-06 LAB — CBC
HCT: 30.5 % — ABNORMAL LOW (ref 39.0–52.0)
Hemoglobin: 9.9 g/dL — ABNORMAL LOW (ref 13.0–17.0)
MCH: 26.8 pg (ref 26.0–34.0)
MCHC: 32.5 g/dL (ref 30.0–36.0)
MCV: 82.7 fL (ref 78.0–100.0)
PLATELETS: 268 10*3/uL (ref 150–400)
RBC: 3.69 MIL/uL — ABNORMAL LOW (ref 4.22–5.81)
RDW: 14.9 % (ref 11.5–15.5)
WBC: 9.8 10*3/uL (ref 4.0–10.5)

## 2016-06-06 LAB — GLUCOSE, CAPILLARY
GLUCOSE-CAPILLARY: 282 mg/dL — AB (ref 65–99)
Glucose-Capillary: 169 mg/dL — ABNORMAL HIGH (ref 65–99)
Glucose-Capillary: 244 mg/dL — ABNORMAL HIGH (ref 65–99)
Glucose-Capillary: 222 mg/dL — ABNORMAL HIGH (ref 65–99)

## 2016-06-06 LAB — URINALYSIS, ROUTINE W REFLEX MICROSCOPIC
BILIRUBIN URINE: NEGATIVE
Glucose, UA: 100 mg/dL — AB
KETONES UR: NEGATIVE mg/dL
Leukocytes, UA: NEGATIVE
Nitrite: NEGATIVE
PH: 5.5 (ref 5.0–8.0)
PROTEIN: 100 mg/dL — AB
Specific Gravity, Urine: 1.022 (ref 1.005–1.030)

## 2016-06-06 MED ORDER — SODIUM CHLORIDE 0.9 % WEIGHT BASED INFUSION
3.0000 mL/kg/h | INTRAVENOUS | Status: DC
  Administered 2016-06-07: 3 mL/kg/h via INTRAVENOUS

## 2016-06-06 MED ORDER — ASPIRIN 81 MG PO CHEW
81.0000 mg | CHEWABLE_TABLET | ORAL | Status: AC
  Administered 2016-06-07: 81 mg via ORAL
  Filled 2016-06-06: qty 1

## 2016-06-06 MED ORDER — FUROSEMIDE 40 MG PO TABS
40.0000 mg | ORAL_TABLET | Freq: Two times a day (BID) | ORAL | Status: DC
  Administered 2016-06-07 – 2016-06-17 (×19): 40 mg via ORAL
  Filled 2016-06-06 (×19): qty 1

## 2016-06-06 MED ORDER — SODIUM CHLORIDE 0.9 % IV SOLN
250.0000 mL | INTRAVENOUS | Status: DC | PRN
  Administered 2016-06-06: 250 mL via INTRAVENOUS

## 2016-06-06 MED ORDER — SODIUM CHLORIDE 0.9 % WEIGHT BASED INFUSION
1.0000 mL/kg/h | INTRAVENOUS | Status: DC
Start: 2016-06-07 — End: 2016-06-07
  Administered 2016-06-07: 1 mL/kg/h via INTRAVENOUS

## 2016-06-06 MED ORDER — SODIUM CHLORIDE 0.9% FLUSH: 3.0000 mL | INTRAVENOUS | Status: DC | PRN

## 2016-06-06 MED ORDER — SODIUM CHLORIDE 0.9% FLUSH
3.0000 mL | Freq: Two times a day (BID) | INTRAVENOUS | Status: DC
  Administered 2016-06-06: 3 mL via INTRAVENOUS

## 2016-06-06 NOTE — Progress Notes (Signed)
Patient feels "feverish" and temp was 101.1. MD notified and blood cultures were drawn. Tylenol was given. Will continue to monitor.

## 2016-06-06 NOTE — Progress Notes (Signed)
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   Hyperlipidemia   Hx of CABG x 5 2010   Carotid artery disease-RCEA 2010   Spinal stenosis of lumbar region   BPH (benign prostatic hypertrophy)   History of CVA (cerebrovascular accident)   Moderate to severe aortic stenosis   S/P BKA (below knee amputation) unilateral (HCC)   HTN (hypertension)   PVD (peripheral vascular disease) (HCC)   Elevated troponin   Pulmonary hypertension (HCC)   Chronic systolic congestive heart failure, NYHA class 1 (HCC)   Tachycardia   ICM-25-30% March 2017 echo   Ischemic ulcer of toe of right foot [facility])   Primary Cardiologist: Dr Percival Spanish  Patient Profile: 75 yo male w/ hx CABG x 5 with RCEA in 2010, DM w/ neuropathy, CRI-2, s/p Lt BKA 2013 2nd chronic DM foot infection. Admitted 06/15 w/ dry gangrene R foot, cards seeing for CHF/CAD, R/L heart cath 06/19, do from L wrist.  SUBJECTIVE: Sad to be missing Father's day, no CP or SOB.  OBJECTIVE Filed Vitals:   06/05/16 1953 06/05/16 2000 06/06/16 0000 06/06/16 0520  BP: 98/54   122/60  Pulse: 88   88  Temp: 101.7 F (38.7 C) 100.4 F (38 C) 98.4 F (36.9 C) 99.6 F (37.6 C)  TempSrc: Oral  Oral Oral  Resp: 20   20  Height:      Weight:    189 lb 6 oz (85.9 kg)  SpO2: 93%   94%    Intake/Output Summary (Last 24 hours) at 06/06/16 0921 Last data filed at 06/06/16 0200  Gross per 24 hour  Intake    850 ml  Output      0 ml  Net    850 ml   Filed Weights   06/04/16 0516 06/05/16 0459 06/06/16 0520  Weight: 187 lb 14.4 oz (85.231 kg) 187 lb 3.2 oz (84.913 kg) 189 lb 6 oz (85.9 kg)    PHYSICAL EXAM General: Well developed, well nourished, male in no acute distress. Head: Normocephalic, atraumatic.  Neck: Supple without bruits, JVD minimal elevation. Lungs:  Resp regular and unlabored,  decreased BS bases. Heart: RRR, S1, S2, no S3, S4, typical AS murmur; no rub. Abdomen: Soft, non-tender, non-distended, BS + x 4.  Extremities: No clubbing, cyanosis, edema. S/p L BKA Neuro: Alert and oriented X 3. Moves all extremities spontaneously. Psych: Normal affect.  LABS: CBC: Recent Labs  06/05/16 0638 06/06/16 0258  WBC 12.4* 9.8  HGB 9.7* 9.9*  HCT 29.6* 30.5*  MCV 81.5 82.7  PLT 254 268   INR: Recent Labs  06/05/16 0958  INR [id number]   Basic Metabolic Panel: Recent Labs  06/05/16 0638 06/06/16 0258  NA 132* 132*  K 3.4* 3.5  CL 99* 96*  CO2 23 26  GLUCOSE 121* 206*  BUN 16 17  CREATININE 1.11 1.28*  CALCIUM 8.3* 8.5*   Cardiac Enzymes: Recent Labs  06/03/16 1825 06/03/16 2333 06/04/16 0559  TROPONINI 0.95* 0.94* 1.08*    Recent Labs  06/03/16 1131  TROPIPOC 0.22*   Hemoglobin A1C: Recent Labs  06/03/16 1116  HGBA1C 8.0*   Anemia Panel: Recent Labs  06/05/16 1441  VITAMINB12 485  FOLATE 21.4  FERRITIN 463*  TIBC 183*  IRON 22*  RETICCTPCT 1.0    TELE:  ECG:   Radiology/Studies: Dg Chest 2 View 06/05/2016  CLINICAL DATA:  Shortness breath EXAM: CHEST  2 VIEW COMPARISON:  06/03/2016 chest radiograph. FINDINGS: Sternotomy wires appear aligned and intact. CABG clips overlie the mediastinum. Partially visualized surgical hardware from ACDF overlying the lower cervical spine. Stable cardiomediastinal silhouette with mild cardiomegaly. No pneumothorax. Stable trace left pleural effusion. No right pleural effusion. No pulmonary edema. No acute consolidative airspace disease. Stable mild left basilar atelectasis. IMPRESSION: 1. Stable mild cardiomegaly without pulmonary edema. 2. Stable trace left pleural effusion and mild left basilar atelectasis. Electronically Signed   By: Ilona Sorrel M.D.   On: 06/05/2016 12:26     Current Medications:  . aspirin EC  81 mg Oral Daily  . atenolol  50 mg Oral Daily  . baclofen  10 mg Oral Daily   . enoxaparin (LOVENOX) injection  40 mg Subcutaneous Q24H  . furosemide  40 mg Oral BID  . gabapentin  300 mg Oral TID  . insulin aspart  0-5 Units Subcutaneous QHS  . insulin aspart  0-9 Units Subcutaneous TID WC  . insulin NPH Human  15 Units Subcutaneous BID AC & HS  . piperacillin-tazobactam (ZOSYN)  IV  3.375 g Intravenous Q8H  . potassium chloride SA  20 mEq Oral Daily  . sodium chloride flush  3 mL Intravenous Q12H  . vancomycin  1,000 mg Intravenous Q12H   . sodium chloride 1 mL/kg/hr (06/04/16 0515)    ASSESSMENT AND PLAN: CARDIOMYOPATHY: Distant CABG, SSCP w/ likely need for vascular surgery or Right BKA with difficult rehab given previous left BKA -Troponin still elevated but flat trend. - Right and left cath Monday, pt and wife have no questions. Orders written, will make sure he is on the board - Hb low but stable can do cath from left wrist   PVD: On antibiotics for non healing foot wound. Dr Donnetta Hutching has seen ? 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   Hyperlipidemia   Hx of CABG x 5 2010   Carotid artery disease-RCEA 2010   Spinal stenosis of lumbar region   BPH (benign prostatic hypertrophy)   History of CVA (cerebrovascular accident)   Moderate to severe aortic stenosis   S/P BKA (below knee amputation) unilateral (HCC)   HTN (hypertension)   PVD (peripheral vascular disease) (HCC)   Elevated troponin   Pulmonary hypertension  ([place])   Chronic systolic congestive heart failure, NYHA class 1 ([place])   Tachycardia   ICM-25-30% March 2017 echo   Ischemic ulcer of toe of right foot ([place])   Signed, Rosaria Ferries , PA-C 9:21 AM 06/06/2016  Patient examined chart reviewed. Ischemic DCM with CABG 2010 EF EF 25-30% but still able to generate AV gradient 36 mmHg with severe AS murmur on exam  Needs vascular surgery for limb salvage on right Already had left BKA  Also previos RCEA.  Hold diuretic for  cath orders writen on board  May be a TAVR Candidate at some point but not sure his PV dx can wait for this w/u  Jenkins Rouge

## 2016-06-06 NOTE — Progress Notes (Signed)
PT Cancellation Note  Patient Details Name: Anthony Holder MRN: [id number] DOB: [birth year]   Cancelled Treatment:    Reason Eval/Treat Not Completed: Medical issues which prohibited therapy, pt alseep and when aroused reports that he feels feverish and does not feel that he can get up. Going for cath tomorrow, will f/u then.    [place], [place] 06/06/2016, 4:21 PM

## 2016-06-06 NOTE — Progress Notes (Signed)
Subjective: Interval History: none.. Remains stable. For heart cath tomorrow.   Objective: Vital signs in last 24 hours: Temp:  [98.4 F (36.9 C)-101.7 F (38.7 C)] 99.6 F (37.6 C) (06/18 0520) Pulse Rate:  [88-89] 88 (06/18 0520) Resp:  [18-20] 20 (06/18 0520) BP: (98-126)/(54-69) 122/60 mmHg (06/18 0520) SpO2:  [91 %-94 %] 94 % (06/18 0520) Weight:  [189 lb 6 oz (85.9 kg)] 189 lb 6 oz (85.9 kg) (06/18 0520)  Intake/Output from previous day: 06/17 0701 - 06/18 0700 In: 1090 [P.O.:840; IV Piggyback:250] Out: -  Intake/Output this shift:    No change in his right foot. Dry gangrene of his fifth toe and eschar in his urge of his foot. Continues to have a fever with MAXIMUM TEMPERATURE of 101.7. No right popliteal pulse and 2+ femoral pulse  Lab Results:  Recent Labs  06/05/16 0638 06/06/16 0258  WBC 12.4* 9.8  HGB 9.7* 9.9*  HCT 29.6* 30.5*  PLT 254 268   BMET  Recent Labs  06/05/16 0638 06/06/16 0258  NA 132* 132*  K 3.4* 3.5  CL 99* 96*  CO2 23 26  GLUCOSE 121* 206*  BUN 16 17  CREATININE 1.11 1.28*  CALCIUM 8.3* 8.5*    Studies/Results: Dg Chest 2 View  06/05/2016  CLINICAL DATA:  Shortness breath EXAM: CHEST  2 VIEW COMPARISON:  06/03/2016 chest radiograph. FINDINGS: Sternotomy wires appear aligned and intact. CABG clips overlie the mediastinum. Partially visualized surgical hardware from ACDF overlying the lower cervical spine. Stable cardiomediastinal silhouette with mild cardiomegaly. No pneumothorax. Stable trace left pleural effusion. No right pleural effusion. No pulmonary edema. No acute consolidative airspace disease. Stable mild left basilar atelectasis. IMPRESSION: 1. Stable mild cardiomegaly without pulmonary edema. 2. Stable trace left pleural effusion and mild left basilar atelectasis. Electronically Signed   By: Ilona Sorrel M.D.   On: 06/05/2016 12:26   Dg Chest 2 View  06/03/2016  CLINICAL DATA:  Chest pain EXAM: CHEST  2 VIEW COMPARISON:   March 03, 2016 FINDINGS: There is mild scarring in the left base. Lungs elsewhere are clear. Heart is upper normal in size with pulmonary vascularity within normal limits. Patient is status post coronary artery bypass grafting. No adenopathy. There is postoperative change in the lower cervical spine region. IMPRESSION: Scarring left base. No edema or consolidation. Stable cardiac silhouette. Electronically Signed   By: Lowella Grip III M.D.   On: 06/03/2016 13:19   Mr Foot Right W Wo Contrast  06/02/2016  CLINICAL DATA:  Nonhealing wounds at the distal aspect of the right first metatarsal and between the fourth and fifth toes for 4-5 weeks. Question abscess or osteomyelitis. EXAM: MRI OF THE RIGHT FOREFOOT WITHOUT AND WITH CONTRAST TECHNIQUE: Multiplanar, multisequence MR imaging was performed both before and after administration of intravenous contrast. CONTRAST:  18 ml MULTIHANCE GADOBENATE DIMEGLUMINE 529 MG/ML IV SOLN COMPARISON:  Plain films of the right foot 05/24/2016. FINDINGS: There may be a skin wound along the head of the first metatarsal versus artifactual loss of visualization of cutaneous tissues. Artifact is favored. No abscess is identified. No bone marrow signal abnormality to suggest osteomyelitis is seen. Mild first MTP degenerative change is seen with some associated marrow edema in the medial sesamoid. Intrinsic musculature the foot demonstrates some fatty replacement with associated mildly increased T2 signal. IMPRESSION: Negative for abscess or osteomyelitis. Mild first MTP osteoarthritis. Electronically Signed   By: Inge Rise M.D.   On: 06/02/2016 16:03   Dg Foot Complete  Right  05/24/2016  CLINICAL DATA:  Nonhealing wound in the first toe EXAM: RIGHT FOOT COMPLETE - 3+ VIEW COMPARISON:  None. FINDINGS: Mild degenerative changes of the first MTP joint are noted. No acute fracture or dislocation is seen. Mild degenerative changes are noted in the tarsal bones as well. No gross  soft tissue abnormality is seen. No bony erosion to suggest osteomyelitis is noted. IMPRESSION: Degenerative change without acute bony abnormality. Electronically Signed   By: Inez Catalina M.D.   On: 05/24/2016 21:18   Anti-infectives: Anti-infectives    Start     Dose/Rate Route Frequency Ordered Stop   06/03/16 1400  piperacillin-tazobactam (ZOSYN) IVPB 3.375 g     3.375 g 12.5 mL/hr over 240 Minutes Intravenous Every 8 hours 06/03/16 1352     06/03/16 1400  vancomycin (VANCOCIN) IVPB 1000 mg/200 mL premix     1,000 mg 200 mL/hr over 60 Minutes Intravenous Every 12 hours 06/03/16 1352        Assessment/Plan: s/p Procedure(s): Left Heart Cath and Cors/Grafts Angiography (N/A) For further cardiac evaluation tomorrow. No change in his right foot. The patient is questioning possible referral to another center for treatment. I assured him that we had all options available for limb salvage. Certainly would not object to outside referral. In reviewing his films, he was felt initially to have adequate flow for healing attempt. This currently is not happening and therefore do feel that he is a candidate for potential endovascular treatment. Has high-grade stenosis of his SFA over a focal area and is popliteal. Also at the origin of his peroneal runoff. Did have improvement of flow with in-line flow at least to his popliteal. Will make further recommendations regarding this versus prosthetic femoral to peroneal bypass ending on his cardiac status. Discussed at length with the patient and his wife present to understand and agree with this plan.   LOS: 1 day   Curt Jews 06/06/2016, 9:54 AM

## 2016-06-06 NOTE — Progress Notes (Signed)
[facility]                                                                              Patient Demographics  Anthony Holder, is a 75 y.o. male, DOB - 12/10/1941, [id number]  Admit date - 06/03/2016   Admitting Physician Albertine Patricia, MD  Outpatient Primary MD for the patient is [name], MD  Outpatient specialists:   LOS -   days    Chief Complaint  Patient presents with  . Tachycardia  . Toe Pain       Brief summary   Patient is a 75 year old male with severe PVD,with prior left BKA and known ischemic ulcers right foot, chronic systolic CHF, AS with PAH, hypertension, history of CAD/ CABG 5 in 2010, DM, BPH, dyslipidemia and history of CVA. Patient recently underwent aortogram with right lower extremity runoff and may was found to have significant disease that was not amenable to intervention or surgery and medical therapy was opted for by the vascular surgeon. The past 2 days he noticed increased darkening of the pinky toe of the right foot with pain. He also noticed palpitations. He underwent an outpatient MRI yesterday on 6/14 that showed negative findings regarding abscess or osteomyelitis.  Patient was admitted for further workup. Troponin was 0.2  I assumed his care on 06/05/2016.   Assessment & Plan   Elevated troponin/Hx of CABG x 5 2010/abnormal EKG, cardiomyopathy, palpitations - Patient without chest pain, troponin trended up to 1.08, new ST segment downsloping and T-wave inversion on EKG. Cardiology consulted, per Dr. Percival Spanish, needs a right and left cardiac cath, Cardiology following. Currently chest pain-free, on aspirin, beta blocker for secondary prevention, has statin allergy.  Aortic stenosis, moderate/Pulmonary hypertension - has a loud murmur, per echo moderate to severe stenosis, cardiology following defer management to cardiology.   Chronic systolic congestive heart failure, NYHA class 1 EF 25%. With moderate to  severe AS. Currently symptom-free, blood pressure is borderline, on beta blocker and Lasix, not on ACE inhibitor or ARB due to soft blood pressures and moderate to severe AS.  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 Surrounding cellulitis  - Continue IV vancomycin and Zosyn. Vascular surgery following, may require right BKA, he is spiking temps up to 102 since his sustaining 06/04/2016, UA ordered, chest x-ray stable. Blood cultures ordered . Discussed with [name] vascular surgeon on 06/05/2016.  HTN (hypertension) -Blood pressure currently well-controlled on Tenormin  Hyperlipidemia -Continue omega-3 fatty acids  BPH (benign prostatic hypertrophy)-Monitor for acute urinary retention  History of CVA (cerebrovascular accident)-Continue aspirin, Patient with documented statin allergy  Anemia - likely AOCD -  Follow H&H and FOBT, anemia panel inconclusive but most consistent with iron deficiency, once infection is ruled out we'll place on iron.  CKD 3 - close to baseline creat of 1.2.  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 Wall thickness was normal. Systolic function was severely reduced. The estimated ejection fraction was in the range of 25% to 30%. Akinesis of the inferior myocardium. - Aortic valve: There was moderate to severe stenosis. Valve area  (VTI): 0.66 cm^2. Valve area (Vmax): 0.62 cm^2. Valve area (Vmean): 0.58 cm^2. - Left atrium: The atrium was mildly dilated. - Pulmonary arteries: Systolic pressure was moderately increased. PA peak pressure: 54 mm Hg (S).   MR R foot - no osteomyelitis or abscess.    Consultants:    Cardiology Vascular surgery  Antibiotics :   Anti-infectives    Start     Dose/Rate Route Frequency Ordered Stop   06/03/16 1400  piperacillin-tazobactam (ZOSYN) IVPB 3.375 g     3.375 g 12.5 mL/hr over 240 Minutes Intravenous Every 8 hours 06/03/16 1352     06/03/16 1400  vancomycin (VANCOCIN) IVPB 1000 mg/200 mL premix     1,000 mg 200 mL/hr over 60 Minutes Intravenous Every 12 hours 06/03/16 1352       Medications  Scheduled Meds: . aspirin EC  81 mg Oral Daily  . atenolol  50 mg Oral Daily  . baclofen  10 mg Oral Daily  . enoxaparin (LOVENOX) injection  40 mg Subcutaneous Q24H  . furosemide  40 mg Oral BID  . gabapentin  300 mg Oral TID  . insulin aspart  0-5 Units Subcutaneous QHS  . insulin aspart  0-9 Units Subcutaneous TID WC  . insulin NPH Human  15 Units Subcutaneous BID AC & HS  . piperacillin-tazobactam (ZOSYN)  IV  3.375 g Intravenous Q8H  . potassium chloride SA  20 mEq Oral Daily  . sodium chloride flush  3 mL Intravenous Q12H  . vancomycin  1,000 mg Intravenous Q12H   Continuous Infusions: . sodium chloride 1 mL/kg/hr (06/04/16 0515)   PRN Meds:.sodium chloride, acetaminophen, gi cocktail, morphine injection, ondansetron (ZOFRAN) IV, sodium chloride flush, traMADol   Subjective:   Anthony Holder  .    Objective:   Filed Vitals:   06/05/16 1953 06/05/16 2000 06/06/16 0000 06/06/16 0520  BP: 98/54   122/60  Pulse: 88   88  Temp: 101.7 F (38.7 C) 100.4 F (38 C) 98.4 F (36.9 C) 99.6 F (37.6 C)  TempSrc: Oral  Oral Oral  Resp: 20   20  Height:      Weight:    85.9 kg (189 lb 6 oz)  SpO2: 93%   94%    Intake/Output Summary (Last 24 hours) at 06/06/16  0952 Last data filed at 06/06/16 0200  Gross per 24 hour  Intake    850 ml  Output      0 ml  Net    850 ml     Wt Readings from Last 3 Encounters:  06/06/16 85.9 kg (189 lb 6 oz)  05/10/16 86.183 kg (190 lb)  05/06/16 88.905 kg (196 lb)     Exam  General: Alert and oriented x 3, NAD  HEENT:    Neck: Supple, no JVD, no masses  Cardiovascular: S1 S2 auscultated, no rubs, Loud  aortic systolic murmur, Regular rate and rhythm.  Respiratory: Clear to auscultation bilaterally, no wheezing, rales or rhonchi  Gastrointestinal: Soft, nontender, nondistended, + bowel sounds  Ext: no cyanosis clubbing, left BKA  Neuro: no new deficit  Skin: Right 4,5 and great toe dry gangrene with eschar, cellulitis on the surrounding area  Psych: Normal affect and demeanor, alert and oriented x3    Data Reviewed:  I have personally reviewed following labs and imaging studies  Micro Results No results found for this or any previous visit (from the past 240 hour(s)).  Radiology Reports Dg Chest 2 View  06/05/2016  CLINICAL DATA:  Shortness breath EXAM: CHEST  2 VIEW COMPARISON:  06/03/2016 chest radiograph. FINDINGS: Sternotomy wires appear aligned and intact. CABG clips overlie the mediastinum. Partially visualized surgical hardware from ACDF overlying the lower cervical spine. Stable cardiomediastinal silhouette with mild cardiomegaly. No pneumothorax. Stable trace left pleural effusion. No right pleural effusion. No pulmonary edema. No acute consolidative airspace disease. Stable mild left basilar atelectasis. IMPRESSION: 1. Stable mild cardiomegaly without pulmonary edema. 2. Stable trace left pleural effusion and mild left basilar atelectasis. Electronically Signed   By: Ilona Sorrel M.D.   On: 06/05/2016 12:26   Dg Chest 2 View  06/03/2016  CLINICAL DATA:  Chest pain EXAM: CHEST  2 VIEW COMPARISON:  March 03, 2016 FINDINGS: There is mild scarring in the left base. Lungs elsewhere are  clear. Heart is upper normal in size with pulmonary vascularity within normal limits. Patient is status post coronary artery bypass grafting. No adenopathy. There is postoperative change in the lower cervical spine region. IMPRESSION: Scarring left base. No edema or consolidation. Stable cardiac silhouette. Electronically Signed   By: Lowella Grip III M.D.   On: 06/03/2016 13:19   Mr Foot Right W Wo Contrast  06/02/2016  CLINICAL DATA:  Nonhealing wounds at the distal aspect of the right first metatarsal and between the fourth and fifth toes for 4-5 weeks. Question abscess or osteomyelitis. EXAM: MRI OF THE RIGHT FOREFOOT WITHOUT AND WITH CONTRAST TECHNIQUE: Multiplanar, multisequence MR imaging was performed both before and after administration of intravenous contrast. CONTRAST:  18 ml MULTIHANCE GADOBENATE DIMEGLUMINE 529 MG/ML IV SOLN COMPARISON:  Plain films of the right foot 05/24/2016. FINDINGS: There may be a skin wound along the head of the first metatarsal versus artifactual loss of visualization of cutaneous tissues. Artifact is favored. No abscess is identified. No bone marrow signal abnormality to suggest osteomyelitis is seen. Mild first MTP degenerative change is seen with some associated marrow edema in the medial sesamoid. Intrinsic musculature the foot demonstrates some fatty replacement with associated mildly increased T2 signal. IMPRESSION: Negative for abscess or osteomyelitis. Mild first MTP osteoarthritis. Electronically Signed   By: Inge Rise M.D.   On: 06/02/2016 16:03   Dg Foot Complete Right  05/24/2016  CLINICAL DATA:  Nonhealing wound in the first toe EXAM: RIGHT FOOT COMPLETE - 3+ VIEW COMPARISON:  None. FINDINGS: Mild degenerative changes of the first MTP joint are noted. No acute fracture or dislocation is seen. Mild degenerative changes are noted in the tarsal bones as well. No gross soft tissue abnormality is seen. No bony erosion to suggest osteomyelitis is noted.  IMPRESSION: Degenerative change without acute bony abnormality. Electronically Signed   By: Inez Catalina M.D.   On: 05/24/2016 21:18     Lab Data:  CBC:  Recent Labs Lab 06/03/16 1116 06/04/16 0559 06/05/16 0638 06/06/16 0258  WBC 16.2* 12.4* 12.4* 9.8  HGB 11.9* 9.8* 9.7* 9.9*  HCT 36.5* 29.9* 29.6* 30.5*  MCV 83.7 82.4 81.5 82.7  PLT 303 270 254 [id number]   Basic Metabolic Panel:  Recent Labs Lab 06/03/16 1116 06/05/16 0638 06/06/16 0258  NA 132* 132* 132*  K 4.1 3.4* 3.5  CL 100* 99* 96*  CO2 23 23 26   GLUCOSE 233* 121* 206*  BUN 22* 16 17  CREATININE 1.16 1.11 1.28*  CALCIUM 9.4 8.3* 8.5*   GFR: Estimated Creatinine Clearance: 52.3 mL/min (by C-G formula based on Cr of 1.28). Liver Function Tests: No results for input(s): AST, ALT, ALKPHOS, BILITOT, PROT, ALBUMIN in the last 168 hours. No results for input(s): LIPASE, AMYLASE in the last 168 hours. No results for input(s): AMMONIA in the last 168 hours. Coagulation Profile:  Recent Labs Lab 06/04/16 0559 06/05/16 0958  INR 1.29 1.36   Cardiac Enzymes:  Recent Labs Lab 06/03/16 1825 06/03/16 2333 06/04/16 0559  TROPONINI 0.95* 0.94* 1.08*   BNP (last 3 results) No results for input(s): PROBNP in the last 8760 hours. HbA1C:  Recent Labs  06/03/16 1116  HGBA1C 8.0*   CBG:  Recent Labs Lab 06/05/16 0738 06/05/16 1228 06/05/16 1729 06/05/16 2111 06/06/16 0737  GLUCAP 120* 200* 260* 227* 169*   Lipid Profile: No results for input(s): CHOL, HDL, LDLCALC, TRIG, CHOLHDL, LDLDIRECT in the last 72 hours. Thyroid Function Tests: No results for input(s): TSH, T4TOTAL, FREET4, T3FREE, THYROIDAB in the last 72 hours. Anemia Panel:  Recent Labs  06/05/16 1441  VITAMINB12 485  FOLATE 21.4  FERRITIN 463*  TIBC 183*  IRON 22*  RETICCTPCT 1.0   Urine analysis:    Component Value Date/Time   COLORURINE AMBER* 02/27/2016 1357   APPEARANCEUR CLEAR 02/27/2016 1357   LABSPEC 1.034* 02/27/2016  1357   PHURINE 5.5 02/27/2016 1357   GLUCOSEU >1000* 02/27/2016 1357   HGBUR LARGE* 02/27/2016 1357   BILIRUBINUR SMALL* 02/27/2016 1357   KETONESUR 15* 02/27/2016 1357   PROTEINUR >300* 02/27/2016 1357   UROBILINOGEN 1.0 06/13/2015 0957   NITRITE NEGATIVE 02/27/2016 1357   LEUKOCYTESUR NEGATIVE 02/27/2016 1357    Signature  SINGH,PRASHANT K M.D on 06/06/2016 at 9:52 AM  Between 7am to 7pm - Pager - [phone], After 7pm go to [email or web address] - password [facility]  [facility]  - Office  [phone]

## 2016-06-07 ENCOUNTER — Ambulatory Visit: Payer: Medicare Other | Admitting: Surgery

## 2016-06-07 ENCOUNTER — Encounter (HOSPITAL_COMMUNITY)
Admission: EM | Disposition: A | Discharge status: Skilled Nursing Facility | Payer: Self-pay | Source: Home / Self Care | Attending: Family Medicine

## 2016-06-07 DIAGNOSIS — I350 Nonrheumatic aortic (valve) stenosis: Secondary | ICD-10-CM

## 2016-06-07 DIAGNOSIS — I77810 Thoracic aortic ectasia: Secondary | ICD-10-CM | POA: Insufficient documentation

## 2016-06-07 DIAGNOSIS — I2510 Atherosclerotic heart disease of native coronary artery without angina pectoris: Secondary | ICD-10-CM

## 2016-06-07 DIAGNOSIS — I25810 Atherosclerosis of coronary artery bypass graft(s) without angina pectoris: Secondary | ICD-10-CM | POA: Insufficient documentation

## 2016-06-07 HISTORY — PX: CARDIAC CATHETERIZATION: SHX172

## 2016-06-07 HISTORY — PX: PERIPHERAL VASCULAR CATHETERIZATION: SHX172C

## 2016-06-07 LAB — CBC
HEMATOCRIT: 28.6 % — AB (ref 39.0–52.0)
HEMOGLOBIN: 9.3 g/dL — AB (ref 13.0–17.0)
MCH: 27.2 pg (ref 26.0–34.0)
MCHC: 32.5 g/dL (ref 30.0–36.0)
MCV: 83.6 fL (ref 78.0–100.0)
Platelets: 244 10*3/uL (ref 150–400)
RBC: 3.42 MIL/uL — ABNORMAL LOW (ref 4.22–5.81)
RDW: 15.2 % (ref 11.5–15.5)
WBC: 9.4 10*3/uL (ref 4.0–10.5)

## 2016-06-07 LAB — POCT I-STAT 3, VENOUS BLOOD GAS (G3P V)
Acid-Base Excess: 1 mmol/L (ref 0.0–2.0)
Acid-Base Excess: 2 mmol/L (ref 0.0–2.0)
BICARBONATE: 25.1 meq/L — AB (ref 20.0–24.0)
Bicarbonate: 26.3 mEq/L — ABNORMAL HIGH (ref 20.0–24.0)
O2 SAT: 48 %
O2 Saturation: 56 %
PCO2 VEN: 37.3 mmHg — AB (ref 45.0–50.0)
PCO2 VEN: 39.3 mmHg — AB (ref 45.0–50.0)
PH VEN: 7.433 — AB (ref 7.250–7.300)
PH VEN: 7.435 — AB (ref 7.250–7.300)
PO2 VEN: 25 mmHg — AB (ref 31.0–45.0)
TCO2: 26 mmol/L (ref 0–100)
TCO2: 27 mmol/L (ref 0–100)
pO2, Ven: 28 mmHg — ABNORMAL LOW (ref 31.0–45.0)

## 2016-06-07 LAB — BASIC METABOLIC PANEL
ANION GAP: 9 (ref 5–15)
BUN: 15 mg/dL (ref 6–20)
CO2: 24 mmol/L (ref 22–32)
Calcium: 8.2 mg/dL — ABNORMAL LOW (ref 8.9–10.3)
Chloride: 98 mmol/L — ABNORMAL LOW (ref 101–111)
Creatinine, Ser: 1.25 mg/dL — ABNORMAL HIGH (ref 0.61–1.24)
GFR calc Af Amer: >60 mL/min (ref >60)
GFR calc non Af Amer: 55 mL/min — ABNORMAL LOW (ref >60)
GLUCOSE: 159 mg/dL — AB (ref 65–99)
POTASSIUM: 3.6 mmol/L (ref 3.5–5.1)
Sodium: 131 mmol/L — ABNORMAL LOW (ref 135–145)

## 2016-06-07 LAB — POCT I-STAT 3, ART BLOOD GAS (G3+)
Bicarbonate: 24.1 mEq/L — ABNORMAL HIGH (ref 20.0–24.0)
O2 Saturation: 98 %
PH ART: 7.454 — AB (ref 7.350–7.450)
TCO2: 25 mmol/L (ref 0–100)
pCO2 arterial: 34.4 mmHg — ABNORMAL LOW (ref 35.0–45.0)
pO2, Arterial: 105 mmHg — ABNORMAL HIGH (ref 80.0–100.0)

## 2016-06-07 LAB — GLUCOSE, CAPILLARY
GLUCOSE-CAPILLARY: 141 mg/dL — AB (ref 65–99)
GLUCOSE-CAPILLARY: 63 mg/dL — AB (ref 65–99)
Glucose-Capillary: 146 mg/dL — ABNORMAL HIGH (ref 65–99)
Glucose-Capillary: 377 mg/dL — ABNORMAL HIGH (ref 65–99)

## 2016-06-07 LAB — URINE CULTURE: Culture: NO GROWTH

## 2016-06-07 SURGERY — RIGHT/LEFT HEART CATH AND CORONARY/GRAFT ANGIOGRAPHY
Anesthesia: LOCAL

## 2016-06-07 MED ORDER — DIAZEPAM 5 MG PO TABS: 5.0000 mg | ORAL_TABLET | ORAL | Status: DC | PRN

## 2016-06-07 MED ORDER — FENTANYL CITRATE (PF) 100 MCG/2ML IJ SOLN
INTRAMUSCULAR | Status: AC
  Filled 2016-06-07: qty 2

## 2016-06-07 MED ORDER — IOPAMIDOL (ISOVUE-370) INJECTION 76%
INTRAVENOUS | Status: DC | PRN
  Administered 2016-06-07: 140 mL via INTRA_ARTERIAL

## 2016-06-07 MED ORDER — HEPARIN (PORCINE) IN NACL 2-0.9 UNIT/ML-% IJ SOLN
INTRAMUSCULAR | Status: AC
  Filled 2016-06-07: qty 1000

## 2016-06-07 MED ORDER — IOPAMIDOL (ISOVUE-370) INJECTION 76%
INTRAVENOUS | Status: AC
  Filled 2016-06-07: qty 50

## 2016-06-07 MED ORDER — SODIUM CHLORIDE 0.9% FLUSH
3.0000 mL | Freq: Two times a day (BID) | INTRAVENOUS | Status: DC
  Administered 2016-06-07 – 2016-06-08 (×2): 3 mL via INTRAVENOUS

## 2016-06-07 MED ORDER — SODIUM CHLORIDE 0.9% FLUSH: 3.0000 mL | INTRAVENOUS | Status: DC | PRN

## 2016-06-07 MED ORDER — LIDOCAINE HCL (PF) 1 % IJ SOLN
INTRAMUSCULAR | Status: DC | PRN
  Administered 2016-06-07: 20 mL via INTRADERMAL

## 2016-06-07 MED ORDER — ENOXAPARIN SODIUM 30 MG/0.3ML ~~LOC~~ SOLN
30.0000 mg | SUBCUTANEOUS | Status: DC
  Administered 2016-06-08: 30 mg via SUBCUTANEOUS
  Filled 2016-06-07: qty 0.3

## 2016-06-07 MED ORDER — MIDAZOLAM HCL 2 MG/2ML IJ SOLN
INTRAMUSCULAR | Status: DC | PRN
  Administered 2016-06-07 (×2): 1 mg via INTRAVENOUS

## 2016-06-07 MED ORDER — LIDOCAINE HCL (PF) 1 % IJ SOLN
INTRAMUSCULAR | Status: AC
  Filled 2016-06-07: qty 30

## 2016-06-07 MED ORDER — SODIUM CHLORIDE 0.9 % IV SOLN: INTRAVENOUS | Status: AC

## 2016-06-07 MED ORDER — ~~LOC~~ CARDIAC SURGERY, PATIENT & FAMILY EDUCATION
Freq: Once | Status: AC
  Administered 2016-06-07: 21:00:00
  Filled 2016-06-07: qty 1

## 2016-06-07 MED ORDER — FENTANYL CITRATE (PF) 100 MCG/2ML IJ SOLN
INTRAMUSCULAR | Status: DC | PRN
  Administered 2016-06-07: 25 ug via INTRAVENOUS

## 2016-06-07 MED ORDER — ONDANSETRON HCL 4 MG/2ML IJ SOLN: 4.0000 mg | Freq: Four times a day (QID) | INTRAMUSCULAR | Status: DC | PRN

## 2016-06-07 MED ORDER — SODIUM CHLORIDE 0.9 % IV SOLN: 250.0000 mL | INTRAVENOUS | Status: DC | PRN

## 2016-06-07 MED ORDER — ACETAMINOPHEN 325 MG PO TABS
650.0000 mg | ORAL_TABLET | ORAL | Status: DC | PRN
  Administered 2016-06-07 – 2016-06-09 (×4): 650 mg via ORAL
  Filled 2016-06-07 (×4): qty 2

## 2016-06-07 MED ORDER — HEPARIN (PORCINE) IN NACL 2-0.9 UNIT/ML-% IJ SOLN
INTRAMUSCULAR | Status: DC | PRN
  Administered 2016-06-07: 1000 mL

## 2016-06-07 MED ORDER — ZOLPIDEM TARTRATE 5 MG PO TABS
5.0000 mg | ORAL_TABLET | Freq: Once | ORAL | Status: AC
  Administered 2016-06-07: 5 mg via ORAL
  Filled 2016-06-07: qty 1

## 2016-06-07 MED ORDER — IOPAMIDOL (ISOVUE-370) INJECTION 76%
INTRAVENOUS | Status: AC
  Filled 2016-06-07: qty 125

## 2016-06-07 MED ORDER — MIDAZOLAM HCL 2 MG/2ML IJ SOLN
INTRAMUSCULAR | Status: AC
  Filled 2016-06-07: qty 2

## 2016-06-07 SURGICAL SUPPLY — 16 items
CATH INFINITI 5 FR IM (CATHETERS) ×1 IMPLANT
CATH INFINITI 5FR JL5 (CATHETERS) ×1 IMPLANT
CATH INFINITI 5FR MULTPACK ANG (CATHETERS) ×1 IMPLANT
CATH SWAN GANZ 7F STRAIGHT (CATHETERS) ×1 IMPLANT
KIT HEART LEFT (KITS) ×3 IMPLANT
PACK CARDIAC CATHETERIZATION (CUSTOM PROCEDURE TRAY) ×3 IMPLANT
SHEATH PINNACLE 5F 10CM (SHEATH) IMPLANT
SHEATH PINNACLE 6F 10CM (SHEATH) ×1 IMPLANT
SHEATH PINNACLE 7F 10CM (SHEATH) ×1 IMPLANT
SYR MEDRAD MARK V 150ML (SYRINGE) ×3 IMPLANT
TRANSDUCER W/STOPCOCK (MISCELLANEOUS) ×6 IMPLANT
TUBING ART PRESS 72  MALE/FEM (TUBING) ×1
TUBING ART PRESS 72 MALE/FEM (TUBING) IMPLANT
WIRE EMERALD 3MM-J .035X150CM (WIRE) ×1 IMPLANT
WIRE EMERALD 3MM-J .035X260CM (WIRE) ×1 IMPLANT
WIRE EMERALD ST .035X150CM (WIRE) ×1 IMPLANT

## 2016-06-07 NOTE — Progress Notes (Signed)
[facility]                                                                              Patient Demographics  Anthony Holder, is a 75 y.o. male, DOB - [birth year], [id number]  Admit date - 06/03/2016   Admitting Physician Albertine Patricia, MD  Outpatient Primary MD for the patient is [name], MD  Outpatient specialists:   LOS -   days    Chief Complaint  Patient presents with  . Tachycardia  . Toe Pain       Brief summary   Patient is a 75 year old male with severe PVD,with prior left BKA and known ischemic ulcers right foot, chronic systolic CHF, AS with PAH, hypertension, history of CAD/ CABG 5 in 2010, DM, BPH, dyslipidemia and history of CVA. Patient recently underwent aortogram with right lower extremity runoff and may was found to have significant disease that was not amenable to intervention or surgery and medical therapy was opted for by the vascular surgeon. The past 2 days he noticed increased darkening of the pinky toe of the right foot with pain. He also noticed palpitations. He underwent an outpatient MRI yesterday on 6/14 that showed negative findings regarding abscess or osteomyelitis.  Patient was admitted for further workup. Troponin was 0.2  I assumed his care on 06/05/2016.   Assessment & Plan   Elevated troponin/Hx of CABG x 5 2010/abnormal EKG, cardiomyopathy, palpitations - Patient without chest pain, troponin trended up to 1.08, new ST segment downsloping and T-wave inversion on EKG. Cardiology consulted, per Dr. Percival Spanish, needs a right and left cardiac cath, Cardiology following. Currently chest pain-free, on aspirin, beta blocker for secondary prevention, has statin allergy. Due for catheterization on 06/07/2016.  Aortic stenosis, moderate/Pulmonary hypertension - has a loud murmur, per echo moderate to severe stenosis, cardiology following defer management to cardiology.   Chronic systolic congestive heart failure,  NYHA class 1 EF 25%. With moderate to severe AS. Currently symptom-free, blood pressure is borderline, on beta blocker and Lasix, not on ACE inhibitor or ARB due to soft blood pressures and moderate to severe AS.  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 Surrounding cellulitis  - Continue IV vancomycin and Zosyn. Vascular surgery following, may require right BKA, he is spiking temps up to 102 since his sustaining 06/04/2016, UA ordered, chest x-ray stable. Blood cultures ordered . Discussed with [name] vascular surgeon on 06/05/2016, vascular surgery to decide angioplasty versus bypass.  HTN (hypertension) -Blood pressure currently well-controlled on Tenormin  Hyperlipidemia -Continue omega-3 fatty acids  BPH (benign prostatic hypertrophy)-Monitor for acute urinary retention  History of CVA (cerebrovascular accident)-Continue aspirin, Patient with documented statin allergy  Anemia - likely AOCD -  Follow H&H and FOBT, anemia panel inconclusive but most consistent with iron deficiency, once infection/bacteremia is ruled out we'll place on iron.  CKD 3 - close to baseline creat of 1.2.  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 Wall thickness was normal. Systolic function was severely reduced. The estimated ejection fraction was in the range of  25% to 30%. Akinesis of the inferior myocardium. - Aortic valve: There was moderate to severe stenosis. Valve area (VTI): 0.66 cm^2. Valve area (Vmax): 0.62 cm^2. Valve area (Vmean): 0.58 cm^2. - Left atrium: The atrium was mildly dilated. - Pulmonary arteries: Systolic pressure was moderately increased. PA peak pressure: 54 mm Hg (S).   MR R foot - no osteomyelitis or abscess.  L heart Cath 06-07-16   Consultants:    Cardiology Vascular surgery  Antibiotics :   Anti-infectives    Start     Dose/Rate Route Frequency Ordered Stop   06/03/16 1400  piperacillin-tazobactam (ZOSYN) IVPB 3.375 g     3.375 g 12.5 mL/hr over 240 Minutes Intravenous Every 8 hours 06/03/16 1352     06/03/16 1400  vancomycin (VANCOCIN) IVPB 1000 mg/200 mL premix     1,000 mg 200 mL/hr over 60 Minutes Intravenous Every 12 hours 06/03/16 1352       Medications  Scheduled Meds: . aspirin EC  81 mg Oral Daily  . atenolol  50 mg Oral Daily  . baclofen  10 mg Oral Daily  . enoxaparin (LOVENOX) injection  40 mg Subcutaneous Q24H  . furosemide  40 mg Oral BID  . gabapentin  300 mg Oral TID  . insulin aspart  0-5 Units Subcutaneous QHS  . insulin aspart  0-9 Units Subcutaneous TID WC  . insulin NPH Human  15 Units Subcutaneous BID AC & HS  . piperacillin-tazobactam (ZOSYN)  IV  3.375 g Intravenous Q8H  . potassium chloride SA  20 mEq Oral Daily  . sodium chloride flush  3 mL Intravenous Q12H  . vancomycin  1,000 mg Intravenous Q12H   Continuous Infusions: . sodium chloride 1 mL/kg/hr (06/07/16 0726)   PRN Meds:.sodium chloride, acetaminophen, gi cocktail, morphine injection, ondansetron (ZOFRAN) IV, sodium chloride flush, traMADol   Subjective:   Anthony Holder  .    Objective:   Filed Vitals:   06/06/16 1630 06/06/16 1726 06/06/16 2020 06/07/16 0500  BP: 142/74 107/49 125/79 130/65  Pulse: 107 98 82 83  Temp: 101.1 F (38.4 C)  97.1 F (36.2 C) 99.3 F (37.4 C)  TempSrc: Oral  Axillary  Oral  Resp: 22  20 20   Height:      Weight:    85.594 kg (188 lb 11.2 oz)  SpO2: 92% 100% 98% 95%    Intake/Output Summary (Last 24 hours) at 06/07/16 1023 Last data filed at 06/07/16 0826  Gross per 24 hour  Intake 1083.68 ml  Output    201 ml  Net 882.68 ml     Wt Readings from Last 3 Encounters:  06/07/16 85.594 kg (188 lb 11.2 oz)  05/10/16 86.183 kg (190 lb)  05/06/16 88.905 kg (196 lb)     Exam  General: Alert and oriented x 3, NAD  HEENT:    Neck: Supple, no JVD, no masses  Cardiovascular: S1  S2 auscultated, no rubs, Loud aortic systolic murmur, Regular rate and rhythm.  Respiratory: Clear to auscultation bilaterally, no wheezing, rales or rhonchi  Gastrointestinal: Soft, nontender, nondistended, + bowel sounds  Ext: no cyanosis clubbing, left BKA  Neuro: no new deficit  Skin: Right 4,5 and great toe dry gangrene with eschar, cellulitis on the surrounding area  Psych: Normal affect and demeanor, alert and oriented x3    Data Reviewed:  I have personally reviewed following labs and imaging studies  Micro Results Recent Results (from the past 240 hour(s))  Urine culture     Status: None   Collection Time: 06/06/16 10:37 AM  Result Value Ref Range Status   Specimen Description URINE, RANDOM  Final   Special Requests NONE  Final   Culture NO GROWTH  Final   Report Status 06/07/2016 FINAL  Final    Radiology Reports Dg Chest 2 View  06/05/2016  CLINICAL DATA:  Shortness breath EXAM: CHEST  2 VIEW COMPARISON:  06/03/2016 chest radiograph. FINDINGS: Sternotomy wires appear aligned and intact. CABG clips overlie the mediastinum. Partially visualized surgical hardware from ACDF overlying the lower cervical spine. Stable cardiomediastinal silhouette with mild cardiomegaly. No pneumothorax. Stable trace left pleural effusion. No right pleural effusion. No pulmonary edema. No acute consolidative airspace disease. Stable mild left basilar atelectasis.  IMPRESSION: 1. Stable mild cardiomegaly without pulmonary edema. 2. Stable trace left pleural effusion and mild left basilar atelectasis. Electronically Signed   By: Ilona Sorrel M.D.   On: 06/05/2016 12:26   Dg Chest 2 View  06/03/2016  CLINICAL DATA:  Chest pain EXAM: CHEST  2 VIEW COMPARISON:  March 03, 2016 FINDINGS: There is mild scarring in the left base. Lungs elsewhere are clear. Heart is upper normal in size with pulmonary vascularity within normal limits. Patient is status post coronary artery bypass grafting. No adenopathy. There is postoperative change in the lower cervical spine region. IMPRESSION: Scarring left base. No edema or consolidation. Stable cardiac silhouette. Electronically Signed   By: Lowella Grip III M.D.   On: 06/03/2016 13:19   Mr Foot Right W Wo Contrast  06/02/2016  CLINICAL DATA:  Nonhealing wounds at the distal aspect of the right first metatarsal and between the fourth and fifth toes for 4-5 weeks. Question abscess or osteomyelitis. EXAM: MRI OF THE RIGHT FOREFOOT WITHOUT AND WITH CONTRAST TECHNIQUE: Multiplanar, multisequence MR imaging was performed both before and after administration of intravenous contrast. CONTRAST:  18 ml MULTIHANCE GADOBENATE DIMEGLUMINE 529 MG/ML IV SOLN COMPARISON:  Plain films of the right foot 05/24/2016. FINDINGS: There may be a skin wound along the head of the first metatarsal versus artifactual loss of visualization of cutaneous tissues. Artifact is favored. No abscess is identified. No bone marrow signal abnormality to suggest osteomyelitis is seen. Mild first MTP degenerative change is seen with some associated marrow edema in the medial sesamoid. Intrinsic musculature the foot demonstrates some fatty replacement with associated mildly increased T2 signal. IMPRESSION: Negative for abscess or osteomyelitis. Mild first MTP osteoarthritis. Electronically Signed   By: Inge Rise M.D.   On: 06/02/2016 16:03   Dg Foot Complete  Right  05/24/2016  CLINICAL DATA:  Nonhealing wound in the first toe EXAM: RIGHT FOOT COMPLETE - 3+ VIEW COMPARISON:  None. FINDINGS: Mild degenerative changes of the first MTP joint are noted. No acute fracture or dislocation is seen. Mild degenerative changes are noted in the tarsal bones as well. No gross soft tissue abnormality is seen. No bony erosion  to suggest osteomyelitis is noted. IMPRESSION: Degenerative change without acute bony abnormality. Electronically Signed   By: Inez Catalina M.D.   On: 05/24/2016 21:18     Lab Data:  CBC:  Recent Labs Lab 06/03/16 1116 06/04/16 0559 06/05/16 [id number] 06/06/16 0258 06/07/16 0451  WBC 16.2* 12.4* 12.4* 9.8 9.4  HGB 11.9* 9.8* 9.7* 9.9* 9.3*  HCT 36.5* 29.9* 29.6* 30.5* 28.6*  MCV 83.7 82.4 81.5 82.7 83.6  PLT 303 270 254 268 [id number]   Basic Metabolic Panel:  Recent Labs Lab 06/03/16 1116 06/05/16 0638 06/06/16 0258 06/06/16 2301 06/07/16 0451  NA 132* 132* 132* 129* 131*  K 4.1 3.4* 3.5 3.5 3.6  CL 100* 99* 96* 96* 98*  CO2 23 23 26 24 24   GLUCOSE 233* 121* 206* 200* 159*  BUN 22* 16 17 17 15   CREATININE 1.16 1.11 1.28* 1.24 1.25*  CALCIUM 9.4 8.3* 8.5* 8.2* 8.2*   GFR: Estimated Creatinine Clearance: 53.5 mL/min (by C-G formula based on Cr of 1.25). Liver Function Tests: No results for input(s): AST, ALT, ALKPHOS, BILITOT, PROT, ALBUMIN in the last 168 hours. No results for input(s): LIPASE, AMYLASE in the last 168 hours. No results for input(s): AMMONIA in the last 168 hours. Coagulation Profile:  Recent Labs Lab 06/04/16 0559 06/05/16 0958  INR 1.29 1.36   Cardiac Enzymes:  Recent Labs Lab 06/03/16 1825 06/03/16 2333 06/04/16 0559  TROPONINI 0.95* 0.94* 1.08*   BNP (last 3 results) No results for input(s): PROBNP in the last 8760 hours. HbA1C: No results for input(s): HGBA1C in the last 72 hours. CBG:  Recent Labs Lab 06/06/16 0737 06/06/16 1131 06/06/16 1626 06/06/16 2135 06/07/16 0738  GLUCAP  169* 244* 282* 222* 146*   Lipid Profile: No results for input(s): CHOL, HDL, LDLCALC, TRIG, CHOLHDL, LDLDIRECT in the last 72 hours. Thyroid Function Tests: No results for input(s): TSH, T4TOTAL, FREET4, T3FREE, THYROIDAB in the last 72 hours. Anemia Panel:  Recent Labs  06/05/16 1441  VITAMINB12 485  FOLATE 21.4  FERRITIN 463*  TIBC 183*  IRON 22*  RETICCTPCT 1.0   Urine analysis:    Component Value Date/Time   COLORURINE YELLOW 06/06/2016 1037   APPEARANCEUR HAZY* 06/06/2016 1037   LABSPEC 1.022 06/06/2016 1037   PHURINE 5.5 06/06/2016 1037   GLUCOSEU 100* 06/06/2016 1037   HGBUR SMALL* 06/06/2016 1037   BILIRUBINUR NEGATIVE 06/06/2016 1037   KETONESUR NEGATIVE 06/06/2016 1037   PROTEINUR 100* 06/06/2016 1037   UROBILINOGEN 1.0 06/13/2015 0957   NITRITE NEGATIVE 06/06/2016 1037   LEUKOCYTESUR NEGATIVE 06/06/2016 1037    Signature  SINGH,PRASHANT K M.D on 06/07/2016 at 10:23 AM  Between 7am to 7pm - Pager - [phone], After 7pm go to [email or web address] - password [facility]  [facility]  - Office  [phone]

## 2016-06-07 NOTE — Progress Notes (Addendum)
Site area: Right groin a 6 french arterial sheath and a 7 french venous sheath was removed.  Site Prior to Removal:  Level 0  Pressure Applied For 20 mins  Bedrest Beginning at 1505 Manual:   Yes.    Patient Status During Pull:  stable  Post Pull Groin Site:  Level 0  Post Pull Instructions Given:  Yes.    Post Pull Pulses Present:  Yes.    Dressing Applied:  Yes.    Comments:  VS remain stable during sheath pull  Sheath pulled by Willene Hatchet RN from 2900.  Report given to [place] on [place]

## 2016-06-07 NOTE — Progress Notes (Addendum)
Inpatient Diabetes Program Recommendations  AACE/ADA: New Consensus Statement on Inpatient Glycemic Control (2015)  Target Ranges:  Prepandial:   less than 140 mg/dL      Peak postprandial:   less than 180 mg/dL (1-2 hours)      Critically ill patients:  140 - 180 mg/dL   Results for Anthony Holder, Anthony Holder (MRN [id number]) as of 06/07/2016 08:31  Ref. Range 06/06/2016 07:37 06/06/2016 11:31 06/06/2016 16:26 06/06/2016 21:35  Glucose-Capillary Latest Ref Range: 65-99 mg/dL 169 (H) 244 (H) 282 (H) 222 (H)    Home DM Meds: NPH Insulin- 30 units AM/ 15 units PM       Metformin 1000 mg bid  Current Insulin Orders: NPH Insulin 15 units bid      Novolog Sensitive Correction Scale/ SSI (0-9 units) TID AC + HS     MD- Please consider the following in-hospital insulin adjustments:  Increase AM dose of NPH Insulin to 30 units QAM (home dose)      --Will follow patient during hospitalization--  Wyn Quaker RN, MSN, CDE Diabetes Coordinator Inpatient Glycemic Control Team Team Pager: [phone] (8a-5p)

## 2016-06-07 NOTE — Progress Notes (Signed)
Cardiologist: Dr. Percival Spanish Subjective:  No CP, no SOB. Fever off and on. Right foot infection. [name].   Objective:  Vital Signs in the last 24 hours: Temp:  [97.1 F (36.2 C)-101.1 F (38.4 C)] 99.3 F (37.4 C) (06/19 0500) Pulse Rate:  [82-107] 83 (06/19 0500) Resp:  [20-22] 20 (06/19 0500) BP: (107-142)/(49-79) 130/65 mmHg (06/19 0500) SpO2:  [92 %-100 %] 95 % (06/19 0500) Weight:  [188 lb 11.2 oz (85.594 kg)] 188 lb 11.2 oz (85.594 kg) (06/19 0500)  Intake/Output from previous day: 06/18 0701 - 06/19 0700 In: 1323.7 [P.O.:720; I.V.:303.7; IV Piggyback:300] Out: 201 [Urine:200; Stool:1]   Physical Exam: General: Well developed, well nourished, in no acute distress. Head:  Normocephalic and atraumatic. Lungs: Clear to auscultation and percussion. Heart: Normal S1 and S2.  3 over 6 systolic ejection murmur musical with radiation of the carotids, no rubs or gallops.  Abdomen: soft, non-tender, positive bowel sounds. Extremities: L BKA, right foot pink, warm. Neurologic: Alert and oriented x 3.    Lab Results:  Recent Labs  06/06/16 0258 06/07/16 0451  WBC 9.8 9.4  HGB 9.9* 9.3*  PLT 268 244    Recent Labs  06/06/16 2301 06/07/16 0451  NA 129* 131*  K 3.5 3.6  CL 96* 98*  CO2 24 24  GLUCOSE 200* 159*  BUN 17 15  CREATININE 1.24 1.25*    Imaging: Dg Chest 2 View  06/05/2016  CLINICAL DATA:  Shortness breath EXAM: CHEST  2 VIEW COMPARISON:  06/03/2016 chest radiograph. FINDINGS: Sternotomy wires appear aligned and intact. CABG clips overlie the mediastinum. Partially visualized surgical hardware from ACDF overlying the lower cervical spine. Stable cardiomediastinal silhouette with mild cardiomegaly. No pneumothorax. Stable trace left pleural effusion. No right pleural effusion. No pulmonary edema. No acute consolidative airspace disease. Stable mild left basilar atelectasis. IMPRESSION: 1. Stable mild cardiomegaly without pulmonary edema. 2. Stable trace  left pleural effusion and mild left basilar atelectasis. Electronically Signed   By: Ilona Sorrel M.D.   On: 06/05/2016 12:26   Personally viewed.   Telemetry: No adverse arrhythmias Personally viewed.   EKG:  Sinus rhythm Personally viewed.  Cardiac Studies:  ECHO 02/29/16 - Left ventricle: The cavity size was normal. Wall thickness was  normal. Systolic function was severely reduced. The estimated  ejection fraction was in the range of 25% to 30%. Akinesis of the  inferior myocardium. - Aortic valve: There was moderate to severe stenosis. Valve area  (VTI): 0.66 cm^2. Valve area (Vmax): 0.62 cm^2. Valve area  (Vmean): 0.58 cm^2. - Left atrium: The atrium was mildly dilated. - Pulmonary arteries: Systolic pressure was moderately increased.  PA peak pressure: 54 mm Hg (S).   Meds: Scheduled Meds: . aspirin EC  81 mg Oral Daily  . atenolol  50 mg Oral Daily  . baclofen  10 mg Oral Daily  . enoxaparin (LOVENOX) injection  40 mg Subcutaneous Q24H  . furosemide  40 mg Oral BID  . gabapentin  300 mg Oral TID  . insulin aspart  0-5 Units Subcutaneous QHS  . insulin aspart  0-9 Units Subcutaneous TID WC  . insulin NPH Human  15 Units Subcutaneous BID AC & HS  . piperacillin-tazobactam (ZOSYN)  IV  3.375 g Intravenous Q8H  . potassium chloride SA  20 mEq Oral Daily  . sodium chloride flush  3 mL Intravenous Q12H  . vancomycin  1,000 mg Intravenous Q12H   Continuous Infusions: . 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  - Vancomycin, Zosyn  - Limb salvage. High-grade SFA stenosis over focal area.  Severe peripheral vascular disease with diabetes  - Status post left BKA, prosthesis  - Right foot as above  Severe coronary artery disease status post CABG 5 in 2010  - On for heart catheterization today, right and left to assess coronary anatomy in anticipation of vascular surgery.  - Flat troponin trend.  - Left radial artery palpated.  - No further substernal chest pain.  Carotid artery disease  - Right CEA  Ischemic cardiomyopathy  - EF 25-30% fairly recent discovery. Awaiting heart catheterization to assist with diagnosis  - Consider changing atenolol to carvedilol  - Fluid status stable.  Moderate to severe aortic stenosis  - Further evaluation with right and left heart cath.  - Mean gradient 36 mmHg despite ejection fraction of 25-30%.   - In the future, which he be a potential TAVR candidate or would his severe peripheral vascular disease limit him?   Candee Furbish 06/07/2016, 10:21 AM

## 2016-06-07 NOTE — Evaluation (Signed)
Physical Therapy Evaluation Patient Details Name: Anthony Holder MRN: [id number] DOB: 1941-06-13 Today's Date: 06/07/2016   History of Present Illness    75 year old male with severe PVD,with prior left BKA and known ischemic ulcers right foot, chronic systolic CHF, AS with PAH, hypertension, history of CAD/ CABG 5 in 2010, DM, BPH, dyslipidemia and history of CVA presents with increased right foot pain, chest palpitations, elevated troponins and abnormal EKG.  Clinical Impression  Patient seen for mobility assessment. Patient as ambulating well with use of prosthesis and requires no physical assist for activity. At this time, do not feel patient requires further ACUTE PT needs, recommend return home and resumption of HHPT upon discharge. Patient in agreement. Will sign off.    Follow Up Recommendations Home health PT (resume services)    Equipment Recommendations  None recommended by PT    Recommendations for Other Services       Precautions / Restrictions        Mobility  Bed Mobility Overal bed mobility: Modified Independent             General bed mobility comments: increased time to perform, no assist or cues needed  Transfers Overall transfer level: Modified independent Equipment used: None;Rolling walker (2 wheeled)             General transfer comment: performed with and without RW, no physical assist required.  Ambulation/Gait Ambulation/Gait assistance: Supervision Ambulation Distance (Feet): 60 Feet Assistive device: Straight cane Gait Pattern/deviations: WFL(Within Functional Limits) Gait velocity: decreaesd   General Gait Details: wears prosthesis, used cane for ambulation due to increased edema LLE impacting fit of prosthesis  Stairs            Wheelchair Mobility    Modified Rankin (Stroke Patients Only)       Balance Overall balance assessment: Modified Independent                                            Pertinent Vitals/Pain Pain Assessment: No/denies pain    Home Living                        Prior Function Level of Independence: Independent with assistive device(s);Independent         Comments: pt wife reports up until a few wks ago pt ws I with ADLS and mod I with SPC for amb     Hand Dominance   Dominant Hand: Right    Extremity/Trunk Assessment   Upper Extremity Assessment: Overall WFL for tasks assessed           Lower Extremity Assessment: LLE deficits/detail   LLE Deficits / Details: BKA LLE wears prosthesis     Communication   Communication: No difficulties  Cognition Arousal/Alertness: Awake/alert Behavior During Therapy: WFL for tasks assessed/performed Overall Cognitive Status: Within Functional Limits for tasks assessed                      General Comments      Exercises        Assessment/Plan    PT Assessment All further PT needs can be met in the next venue of care  PT Diagnosis Difficulty walking   PT Problem List Decreased activity tolerance;Other (comment) (was having therapy for prosthesis training)  PT Treatment Interventions     PT Goals (Current goals can  be found in the Care Plan section) Acute Rehab PT Goals PT Goal Formulation: All assessment and education complete, DC therapy    Frequency     Barriers to discharge        Co-evaluation               End of Session   Activity Tolerance: Patient tolerated treatment well Patient left: in bed;with call bell/phone within reach;with family/visitor present Nurse Communication: Mobility status         Time: 1202-1220 PT Time Calculation (min) (ACUTE ONLY): 18 min   Charges:   PT Evaluation $PT Eval Low Complexity: 1 Procedure     PT G CodesDuncan Dull 2016/06/29, 1:25 PM Alben Deeds, [place] DPT  [phone]

## 2016-06-07 NOTE — Interval H&P Note (Signed)
Cath Lab Visit (complete for each Cath Lab visit)  Clinical Evaluation Leading to the Procedure:   ACS: Yes.    Non-ACS:    Anginal Classification: CCS III  Anti-ischemic medical therapy: Minimal Therapy (1 class of medications)  Non-Invasive Test Results: No non-invasive testing performed  Prior CABG: Previous CABG      History and Physical Interval Note:  06/07/2016 1:13 PM  Anthony Holder  has presented today for surgery, with the diagnosis of chest pain  The various methods of treatment have been discussed with the patient and family. After consideration of risks, benefits and other options for treatment, the patient has consented to  Procedure(s): Right/Left Heart Cath and Coronary/Graft Angiography (N/A) as a surgical intervention .  The patient's history has been reviewed, patient examined, no change in status, stable for surgery.  I have reviewed the patient's chart and labs.  Questions were answered to the patient's satisfaction.     Shelva Majestic

## 2016-06-07 NOTE — H&P (View-Only) (Signed)
Cardiologist: Dr. Percival Spanish Subjective:  No CP, no SOB. Fever off and on. Right foot infection. [name].   Objective:  Vital Signs in the last 24 hours: Temp:  [97.1 F (36.2 C)-101.1 F (38.4 C)] 99.3 F (37.4 C) (06/19 0500) Pulse Rate:  [82-107] 83 (06/19 0500) Resp:  [20-22] 20 (06/19 0500) BP: (107-142)/(49-79) 130/65 mmHg (06/19 0500) SpO2:  [92 %-100 %] 95 % (06/19 0500) Weight:  [188 lb 11.2 oz (85.594 kg)] 188 lb 11.2 oz (85.594 kg) (06/19 0500)  Intake/Output from previous day: 06/18 0701 - 06/19 0700 In: 1323.7 [P.O.:720; I.V.:303.7; IV Piggyback:300] Out: 201 [Urine:200; Stool:1]   Physical Exam: General: Well developed, well nourished, in no acute distress. Head:  Normocephalic and atraumatic. Lungs: Clear to auscultation and percussion. Heart: Normal S1 and S2.  3 over 6 systolic ejection murmur musical with radiation of the carotids, no rubs or gallops.  Abdomen: soft, non-tender, positive bowel sounds. Extremities: L BKA, right foot pink, warm. Neurologic: Alert and oriented x 3.    Lab Results:  Recent Labs  06/06/16 0258 06/07/16 0451  WBC 9.8 9.4  HGB 9.9* 9.3*  PLT 268 244    Recent Labs  06/06/16 2301 06/07/16 0451  NA 129* 131*  K 3.5 3.6  CL 96* 98*  CO2 24 24  GLUCOSE 200* 159*  BUN 17 15  CREATININE 1.24 1.25*    Imaging: Dg Chest 2 View  06/05/2016  CLINICAL DATA:  Shortness breath EXAM: CHEST  2 VIEW COMPARISON:  06/03/2016 chest radiograph. FINDINGS: Sternotomy wires appear aligned and intact. CABG clips overlie the mediastinum. Partially visualized surgical hardware from ACDF overlying the lower cervical spine. Stable cardiomediastinal silhouette with mild cardiomegaly. No pneumothorax. Stable trace left pleural effusion. No right pleural effusion. No pulmonary edema. No acute consolidative airspace disease. Stable mild left basilar atelectasis. IMPRESSION: 1. Stable mild cardiomegaly without pulmonary edema. 2. Stable trace  left pleural effusion and mild left basilar atelectasis. Electronically Signed   By: Ilona Sorrel M.D.   On: 06/05/2016 12:26   Personally viewed.   Telemetry: No adverse arrhythmias Personally viewed.   EKG:  Sinus rhythm Personally viewed.  Cardiac Studies:  ECHO 02/29/16 - Left ventricle: The cavity size was normal. Wall thickness was  normal. Systolic function was severely reduced. The estimated  ejection fraction was in the range of 25% to 30%. Akinesis of the  inferior myocardium. - Aortic valve: There was moderate to severe stenosis. Valve area  (VTI): 0.66 cm^2. Valve area (Vmax): 0.62 cm^2. Valve area  (Vmean): 0.58 cm^2. - Left atrium: The atrium was mildly dilated. - Pulmonary arteries: Systolic pressure was moderately increased.  PA peak pressure: 54 mm Hg (S).   Meds: Scheduled Meds: . aspirin EC  81 mg Oral Daily  . atenolol  50 mg Oral Daily  . baclofen  10 mg Oral Daily  . enoxaparin (LOVENOX) injection  40 mg Subcutaneous Q24H  . furosemide  40 mg Oral BID  . gabapentin  300 mg Oral TID  . insulin aspart  0-5 Units Subcutaneous QHS  . insulin aspart  0-9 Units Subcutaneous TID WC  . insulin NPH Human  15 Units Subcutaneous BID AC & HS  . piperacillin-tazobactam (ZOSYN)  IV  3.375 g Intravenous Q8H  . potassium chloride SA  20 mEq Oral Daily  . sodium chloride flush  3 mL Intravenous Q12H  . vancomycin  1,000 mg Intravenous Q12H   Continuous Infusions: . 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  - Vancomycin, Zosyn  - Limb salvage. High-grade SFA stenosis over focal area.  Severe peripheral vascular disease with diabetes  - Status post left BKA, prosthesis  - Right foot as above  Severe coronary artery disease status post CABG 5 in 2010  - On for heart catheterization today, right and left to assess coronary anatomy in anticipation of vascular surgery.  - Flat troponin trend.  - Left radial artery palpated.  - No further substernal chest pain.  Carotid artery disease  - Right CEA  Ischemic cardiomyopathy  - EF 25-30% fairly recent discovery. Awaiting heart catheterization to assist with diagnosis  - Consider changing atenolol to carvedilol  - Fluid status stable.  Moderate to severe aortic stenosis  - Further evaluation with right and left heart cath.  - Mean gradient 36 mmHg despite ejection fraction of 25-30%.   - In the future, which he be a potential TAVR candidate or would his severe peripheral vascular disease limit him?   Candee Furbish 06/07/2016, 10:21 AM

## 2016-06-08 ENCOUNTER — Encounter (HOSPITAL_COMMUNITY): Payer: Self-pay | Admitting: Cardiovascular Disease

## 2016-06-08 DIAGNOSIS — I25700 Atherosclerosis of coronary artery bypass graft(s), unspecified, with unstable angina pectoris: Secondary | ICD-10-CM

## 2016-06-08 DIAGNOSIS — I96 Gangrene, not elsewhere classified: Secondary | ICD-10-CM

## 2016-06-08 LAB — BASIC METABOLIC PANEL
ANION GAP: 12 (ref 5–15)
BUN: 15 mg/dL (ref 6–20)
CALCIUM: 8.7 mg/dL — AB (ref 8.9–10.3)
CHLORIDE: 100 mmol/L — AB (ref 101–111)
CO2: 22 mmol/L (ref 22–32)
Creatinine, Ser: 1.42 mg/dL — ABNORMAL HIGH (ref 0.61–1.24)
GFR calc non Af Amer: 47 mL/min — ABNORMAL LOW (ref >60)
GFR, EST AFRICAN AMERICAN: 55 mL/min — AB (ref >60)
GLUCOSE: 280 mg/dL — AB (ref 65–99)
POTASSIUM: 4 mmol/L (ref 3.5–5.1)
Sodium: 134 mmol/L — ABNORMAL LOW (ref 135–145)

## 2016-06-08 LAB — CBC
HEMATOCRIT: 33.5 % — AB (ref 39.0–52.0)
HEMATOCRIT: 30.4 % — AB (ref 39.0–52.0)
HEMOGLOBIN: 10.6 g/dL — AB (ref 13.0–17.0)
HEMOGLOBIN: 9.6 g/dL — AB (ref 13.0–17.0)
MCH: 27 pg (ref 26.0–34.0)
MCH: 26.7 pg (ref 26.0–34.0)
MCHC: 31.6 g/dL (ref 30.0–36.0)
MCHC: 31.6 g/dL (ref 30.0–36.0)
MCV: 85.2 fL (ref 78.0–100.0)
MCV: 84.7 fL (ref 78.0–100.0)
Platelets: 302 10*3/uL (ref 150–400)
Platelets: 256 10*3/uL (ref 150–400)
RBC: 3.93 MIL/uL — AB (ref 4.22–5.81)
RBC: 3.59 MIL/uL — ABNORMAL LOW (ref 4.22–5.81)
RDW: 15.4 % (ref 11.5–15.5)
RDW: 15.3 % (ref 11.5–15.5)
WBC: 13 10*3/uL — AB (ref 4.0–10.5)
WBC: 12.4 10*3/uL — AB (ref 4.0–10.5)

## 2016-06-08 LAB — GLUCOSE, CAPILLARY
GLUCOSE-CAPILLARY: 247 mg/dL — AB (ref 65–99)
GLUCOSE-CAPILLARY: 227 mg/dL — AB (ref 65–99)
Glucose-Capillary: 254 mg/dL — ABNORMAL HIGH (ref 65–99)
Glucose-Capillary: 245 mg/dL — ABNORMAL HIGH (ref 65–99)

## 2016-06-08 MED ORDER — ZOLPIDEM TARTRATE 5 MG PO TABS
5.0000 mg | ORAL_TABLET | Freq: Every evening | ORAL | Status: DC | PRN
  Administered 2016-06-09: 5 mg via ORAL
  Filled 2016-06-08 (×2): qty 1

## 2016-06-08 MED ORDER — CARVEDILOL 12.5 MG PO TABS
12.5000 mg | ORAL_TABLET | Freq: Two times a day (BID) | ORAL | Status: DC
  Administered 2016-06-08 – 2016-06-17 (×17): 12.5 mg via ORAL
  Filled 2016-06-08 (×17): qty 1

## 2016-06-08 MED ORDER — DEXTROSE 5 % IV SOLN
INTRAVENOUS | Status: DC
  Administered 2016-06-09: 06:00:00 via INTRAVENOUS

## 2016-06-08 MED ORDER — FUROSEMIDE 10 MG/ML IJ SOLN
20.0000 mg | Freq: Once | INTRAMUSCULAR | Status: AC
  Administered 2016-06-08: 20 mg via INTRAVENOUS
  Filled 2016-06-08: qty 2

## 2016-06-08 MED ORDER — INSULIN NPH (HUMAN) (ISOPHANE) 100 UNIT/ML ~~LOC~~ SUSP
20.0000 [IU] | Freq: Two times a day (BID) | SUBCUTANEOUS | Status: DC
Start: 2016-06-08 — End: 2016-06-17
  Administered 2016-06-08 – 2016-06-16 (×12): 20 [IU] via SUBCUTANEOUS
  Filled 2016-06-08 (×2): qty 10

## 2016-06-08 MED ORDER — INSULIN ASPART 100 UNIT/ML ~~LOC~~ SOLN
3.0000 [IU] | Freq: Three times a day (TID) | SUBCUTANEOUS | Status: DC
  Administered 2016-06-08 – 2016-06-17 (×15): 3 [IU] via SUBCUTANEOUS

## 2016-06-08 MED ORDER — ENOXAPARIN SODIUM 40 MG/0.4ML ~~LOC~~ SOLN
40.0000 mg | SUBCUTANEOUS | Status: DC
  Administered 2016-06-10 – 2016-06-17 (×7): 40 mg via SUBCUTANEOUS
  Filled 2016-06-08 (×7): qty 0.4

## 2016-06-08 MED ORDER — ENOXAPARIN SODIUM 40 MG/0.4ML ~~LOC~~ SOLN: 40.0000 mg | SUBCUTANEOUS | Status: DC

## 2016-06-08 NOTE — Progress Notes (Signed)
1 Day Post-Op Procedure(s) (LRB): Right/Left Heart Cath and Coronary/Graft Angiography (N/A) Thoracic Aortogram Subjective: Patient examined and echocardiogram, coronary angiogram reviewed He is currently undergoing vascular intervention for threatened RLE Bypass grafts to LAD, OM and RCA-PD are patent but he has mod-severe AS with EF .25 - .30 He would not survive redo-sternotomy open AVR - he appears to be a potential candidate for TAVR after his active infection- gangrene is resolved.  Objective: Vital signs in last 24 hours: Temp:  [97.6 F (36.4 C)-101 F (38.3 C)] 97.6 F (36.4 C) (06/20 1333) Pulse Rate:  [74-125] 74 (06/20 1333) Cardiac Rhythm:  [-] Normal sinus rhythm (06/20 0841) Resp:  [22] 22 (06/20 1333) BP: (103-128)/(55-68) 103/55 mmHg (06/20 1333) SpO2:  [93 %-95 %] 93 % (06/20 1333) Weight:  [186 lb 1.6 oz (84.414 kg)] 186 lb 1.6 oz (84.414 kg) (06/20 0526)  Hemodynamic parameters for last 24 hours:  nsr  Intake/Output from previous day: 06/19 0701 - 06/20 0700 In: 540 [P.O.:240; IV Piggyback:300] Out: 300 [Urine:300] Intake/Output this shift:   3/6 murmur AS Gangrenous changes R foot  R leg is warm Breath sounds distant  Lab Results:  Recent Labs  06/08/16 0401 06/08/16 1549  WBC 13.0* 12.4*  HGB 10.6* 9.6*  HCT 33.5* 30.4*  PLT 302 256   BMET:  Recent Labs  06/07/16 0451 06/08/16 0401  NA 131* 134*  K 3.6 4.0  CL 98* 100*  CO2 24 22  GLUCOSE 159* 280*  BUN 15 15  CREATININE 1.25* 1.42*  CALCIUM 8.2* 8.7*    PT/INR: No results for input(s): LABPROT, INR in the last 72 hours. ABG    Component Value Date/Time   PHART 7.454* 06/07/2016 1338   HCO3 25.1* 06/07/2016 1342   TCO2 26 06/07/2016 1342   ACIDBASEDEF 5.0* 03/01/2016 0921   O2SAT 56.0 06/07/2016 1342   CBG (last 3)   Recent Labs  06/08/16 0724 06/08/16 1129 06/08/16 1625  GLUCAP 247* 254* 245*    Assessment/Plan: S/P Procedure(s) (LRB): Right/Left Heart Cath and  Coronary/Graft Angiography (N/A) Thoracic Aortogram Will follow after VVS intervention[s]   LOS: 3 days    Anthony Holder 06/08/2016

## 2016-06-08 NOTE — Progress Notes (Signed)
Patient ID: Anthony Holder, male   DOB: Jan 12, 1941, 75 y.o.   MRN: [id number] Seems to have some increased work of breathing this morning. His wife is in the room with him as well. Reports that he was just had his bed changed and is a little fatigued from this. Upset regarding cath findings yesterday with severe aortic stenosis and markedly decreased ejection fraction of 25%. Once to heart surgery will be required.  No change in his right foot. Dry gangrene of his fifth toe and at the medial aspect of his first metatarsal head. Minimal surrounding erythema no fluctuance.  Again discussed options for potential limb salvage. Have scheduled him for Dr. Trula Slade tomorrow for intervention on right superficial femoral popliteal and potentially the origin of his peroneal artery runoff. Understands he may still suffer limb loss despite these attempts. Would not recommend prosthetic right femoral to peroneal bypass particularly in light of his severe cardiac issues. They understand and ready to proceed tomorrow for endovascular treatment of his critical limb ischemia

## 2016-06-08 NOTE — Care Management Important Message (Signed)
Important Message  Patient Details  Name: Anthony Holder MRN: [id number] Date of Birth: Dec 31, 1940   Medicare Important Message Given:  Yes    Nathen May 06/08/2016, 10:53 AM

## 2016-06-08 NOTE — Care Management Note (Addendum)
Case Management Note  Patient Details  Name: Anthony Holder MRN: [id number] Date of Birth: 1941/03/14  Subjective/Objective: Patient is a 75 year old male with severe PVD,with prior left BKA and known ischemic ulcers right foot, chronic systolic CHF, AS with PAH, hypertension, history of CAD/ CABG 5 in 2010, DM, BPH, dyslipidemia and history of CVA. Patient recently underwent aortogram with right lower extremity runoff and may was found to have significant disease that was not amenable to intervention or surgery and medical therapy was opted for by the vascular surgeon.  Patient underwent left heart cath showing multivessel disease and severe aortic stenosis along with severe systolic dysfunction with EF 25%, cardiology following, he also had evidence of right foot fifth toe dry gangrene with surrounding cellulitis and severe PAD. Vascular surgery following. Plan is to attempt right leg angioplasty on 06/09/2016, cardiology, vascular surgery following. Pt is from home with wife. Plan to return home once stable. Pt was currently active with [facility] for RN/PT. Pt has cane, wheelchair and walker at home.   Action/Plan: CM did offer choice and pt wants to utilize [facility] agency for services. CM did make referral and SOC to begin within 24-48 hours of d/c.    Expected Discharge Date:                  Expected Discharge Plan:  [place]  In-House Referral:  NA  Discharge planning Services  CM Consult  Post Acute Care Choice:  Home Health Choice offered to:  Patient, Spouse  DME Arranged:  N/A DME Agency:  NA  HH Arranged:  RN, PT [place] Agency:  [place]  Status of Service:  Completed, signed off  If discussed at [place] of Stay Meetings, dates discussed:    Additional Comments: 1510 06-15-16 Jacqlyn Krauss, RN, [name] [phone] CM did hear back from CIR- plan will be for recommendation for SNF. CSW is aware and will need to speak with pt to see if agreeable.  Pt is set up with [facility] Services if plan continues to be home. No further needs at this time.   1439 06-15-16 Jacqlyn Krauss, RN,[name] [phone] PT recommendations for CIR. Consult was placed for CIR consult. CM did place call to [facility] in ref to recommendation for CIR. CM will continue to monitor for disposition needs.    1432 06-14-16 Jacqlyn Krauss, RN,[name] [phone] PT to re-evaluate for disposition needs post surgery. CM will continue to monitor for additional needs.   Bethena Roys, RN 06/08/2016, 12:24 PM

## 2016-06-08 NOTE — Progress Notes (Signed)
[facility]                                                                              Patient Demographics  Anthony Holder, is a 75 y.o. male, DOB - 14-Feb-1941, [id number]  Admit date - 06/03/2016   Admitting Physician Albertine Patricia, MD  Outpatient Primary MD for the patient is [name], MD  Outpatient specialists:   LOS -   days    Chief Complaint  Patient presents with  . Tachycardia  . Toe Pain       Brief summary   Patient is a 75 year old male with severe PVD,with prior left BKA and known ischemic ulcers right foot, chronic systolic CHF, AS with PAH, hypertension, history of CAD/ CABG 5 in 2010, DM, BPH, dyslipidemia and history of CVA. Patient recently underwent aortogram with right lower extremity runoff and may was found to have significant disease that was not amenable to intervention or surgery and medical therapy was opted for by the vascular surgeon. The past 2 days he noticed increased darkening of the pinky toe of the right foot with pain. He also noticed palpitations. He underwent an outpatient MRI on 6/14 that showed negative findings regarding abscess or osteomyelitis.   I assumed his care on 06/05/2016.During further workup patient underwent left heart cath showing multivessel disease and severe aortic stenosis along with severe systolic dysfunction with EF 25%, cardiology following, he also had evidence of right foot fifth toe dry gangrene with surrounding cellulitis and severe PAD. Vascular surgery following. Plan is to attempt right leg angioplasty on 06/09/2016, cardiology, vascular surgery following. Cardiology is consulted in cardiothoracic surgery for multivessel disease and severe AS.   Assessment & Plan   Elevated troponin/Hx of CABG x 5 2010/abnormal EKG, cardiomyopathy, palpitations - Patient without chest pain, troponin trended up to 1.08, new ST segment downsloping and T-wave inversion on EKG. Cardiology  consulted, per Dr. Percival Spanish, needs a right and left cardiac cath, Cardiology following. Currently chest pain-free, on aspirin, beta blocker for secondary prevention, has statin allergy. He underwent catheterization on 06/07/2016 showing multivessel disease, EF of 25% along with severe AS. Cardiology following and consulting cardiothoracic surgery for input.  Aortic stenosis, moderate/Pulmonary hypertension - as above.   Mild acute on Chronic systolic congestive heart failure, NYHA class 1 EF 25%. With moderate to severe AS. Currently symptom-free, blood pressure is borderline, on beta blocker and Lasix, not on ACE inhibitor or ARB due to soft blood pressures and moderate to severe AS. On 06/08/2016 he is mildly short of breath as he received IV fluids for cardiac catheterization prep, extra Lasix today and monitor.  PVD (peripheral vascular disease)/S/P BKA (below knee amputation) unilateral/ Ischemic ulcer of right foot with dry gangrene of the fifth toe and then around the first metatarsal head with some surrounding cellulitis  - blood cultures pending, no other source of infection, UA unremarkable, chest x-ray unimpressive, Continue IV vancomycin and Zosyn. Vascular surgery following, discussed his case with [name] on 06/08/2016, vascular surgery will attempt right leg angioplasty on 06/09/2016. For now no plans of amputation.  HTN (hypertension) -Blood pressure currently well-controlled on  Tenormin  Hyperlipidemia -Continue omega-3 fatty acids  BPH (benign prostatic hypertrophy)-Monitor for acute urinary retention  History of CVA (cerebrovascular accident)-Continue aspirin, Patient with documented statin allergy  Anemia - likely AOCD -  Follow H&H and FOBT, anemia panel inconclusive but most consistent with iron deficiency, once infection/bacteremia is ruled out we'll place on iron.  CKD 3 - close to baseline creat of 1.2.  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   Lab Results  Component Value Date   HGBA1C 8.0* 06/03/2016   CBG (last 3)   Recent Labs  06/07/16 1503 06/07/16 2053 06/08/16 0724  GLUCAP 63* 377* 247*      Code Status: Full CODE STATUS DVT Prophylaxis:  Lovenox  Family Communication: Discussed in detail with the patient and wife Daily bedside  Disposition Plan: Stay inpatient   Procedures:   TTE - Left ventricle: The cavity size was normal. Wall thickness was normal. Systolic function was severely reduced. The estimated ejection fraction was in the range of 25% to 30%. Akinesis of the inferior myocardium. - Aortic valve: There was moderate to severe stenosis. Valve area (VTI): 0.66 cm^2. Valve area (Vmax): 0.62 cm^2. Valve area (Vmean): 0.58 cm^2. - Left atrium: The atrium was mildly dilated. - Pulmonary arteries: Systolic pressure was moderately increased. PA peak pressure: 54 mm Hg (S).   MR R foot - no osteomyelitis or abscess.  L heart Cath 06-07-16 - multivessel CAD with severe aortic stenosis  Consultants:    Cardiology Vascular surgery Cardiology Has consulted Dr. Lucianne Lei tright cardiothoracic surgery  Antibiotics :   Anti-infectives    Start     Dose/Rate Route Frequency Ordered Stop   06/03/16 1400  piperacillin-tazobactam (ZOSYN) IVPB 3.375 g     3.375 g 12.5 mL/hr over 240 Minutes Intravenous Every 8 hours 06/03/16 1352     06/03/16 1400  vancomycin (VANCOCIN) IVPB 1000 mg/200 mL premix     1,000 mg 200 mL/hr over 60 Minutes Intravenous Every 12 hours 06/03/16 1352       Medications  Scheduled Meds: . aspirin EC  81 mg Oral Daily  . atenolol  50 mg Oral Daily  . baclofen  10 mg Oral Daily  . [START ON 06/09/2016] enoxaparin (LOVENOX) injection  40 mg Subcutaneous Q24H  . furosemide  20 mg Intravenous Once  . furosemide   40 mg Oral BID  . gabapentin  300 mg Oral TID  . insulin aspart  0-5 Units Subcutaneous QHS  . insulin aspart  0-9 Units Subcutaneous TID WC  . insulin NPH Human  15 Units Subcutaneous BID AC & HS  . piperacillin-tazobactam (ZOSYN)  IV  3.375 g Intravenous Q8H  . potassium chloride SA  20 mEq Oral Daily  . sodium chloride flush  3 mL Intravenous Q12H  . vancomycin  1,000 mg Intravenous Q12H   Continuous Infusions: . [START ON 06/09/2016] dextrose     PRN Meds:.sodium chloride, acetaminophen, diazepam, gi cocktail, morphine injection, ondansetron (ZOFRAN) IV, sodium chloride flush, traMADol   Subjective:   Ikaika Amesquita  .    Objective:   Filed Vitals:   06/08/16 0330 06/08/16 0525 06/08/16 0526 06/08/16 0833  BP:   128/68   Pulse:   125   Temp: 98.3 F (36.8 C) 101 F (38.3 C)  98.5 F (  36.9 C)  TempSrc: Oral   Oral  Resp:   22   Height:      Weight:   84.414 kg (186 lb 1.6 oz)   SpO2:   94%     Intake/Output Summary (Last 24 hours) at 06/08/16 0935 Last data filed at 06/07/16 2030  Gross per 24 hour  Intake    540 ml  Output    300 ml  Net    240 ml     Wt Readings from Last 3 Encounters:  06/08/16 84.414 kg (186 lb 1.6 oz)  05/10/16 86.183 kg (190 lb)  05/06/16 88.905 kg (196 lb)     Exam  General: Alert and oriented x 3, NAD  HEENT:    Neck: Supple, no JVD, no masses  Cardiovascular: S1 S2 auscultated, no rubs, Loud aortic systolic murmur, Regular rate and rhythm.  Respiratory: Clear to auscultation bilaterally, no wheezing, rales or rhonchi  Gastrointestinal: Soft, nontender, nondistended, + bowel sounds  Ext: no cyanosis clubbing, left BKA  Neuro: no new deficit  Skin: Right 4,5 and great toe dry gangrene with eschar, cellulitis on the surrounding area  Psych: Normal affect and demeanor, alert and oriented x3    Data Reviewed:  I have personally reviewed following labs and imaging studies  Micro Results Recent Results (from the past  240 hour(s))  Urine culture     Status: None   Collection Time: 06/06/16 10:37 AM  Result Value Ref Range Status   Specimen Description URINE, RANDOM  Final   Special Requests NONE  Final   Culture NO GROWTH  Final   Report Status 06/07/2016 FINAL  Final  Culture, blood (Routine X 2) w Reflex to ID Panel     Status: None (Preliminary result)   Collection Time: 06/06/16  5:09 PM  Result Value Ref Range Status   Specimen Description BLOOD RIGHT HAND  Final   Special Requests BOTTLES DRAWN AEROBIC AND ANAEROBIC 5CC  Final   Culture NO GROWTH < 24 HOURS  Final   Report Status PENDING  Incomplete  Culture, blood (Routine X 2) w Reflex to ID Panel     Status: None (Preliminary result)   Collection Time: 06/06/16  5:09 PM  Result Value Ref Range Status   Specimen Description BLOOD RIGHT HAND  Final   Special Requests IN PEDIATRIC BOTTLE 5CC  Final   Culture NO GROWTH < 24 HOURS  Final   Report Status PENDING  Incomplete    Radiology Reports Dg Chest 2 View  06/05/2016  CLINICAL DATA:  Shortness breath EXAM: CHEST  2 VIEW COMPARISON:  06/03/2016 chest radiograph. FINDINGS: Sternotomy wires appear aligned and intact. CABG clips overlie the mediastinum. Partially visualized surgical hardware from ACDF overlying the lower cervical spine. Stable cardiomediastinal silhouette with mild cardiomegaly. No pneumothorax. Stable trace left pleural effusion. No right pleural effusion. No pulmonary edema. No acute consolidative airspace disease. Stable mild left basilar atelectasis. IMPRESSION: 1. Stable mild cardiomegaly without pulmonary edema. 2. Stable trace left pleural effusion and mild left basilar atelectasis. Electronically Signed   By: Ilona Sorrel M.D.   On: 06/05/2016 12:26   Dg Chest 2 View  06/03/2016  CLINICAL DATA:  Chest pain EXAM: CHEST  2 VIEW COMPARISON:  March 03, 2016 FINDINGS: There is mild scarring in the left base. Lungs elsewhere are clear. Heart is upper normal in size with  pulmonary vascularity within normal limits. Patient is status post coronary artery bypass grafting. No adenopathy. There is postoperative  change in the lower cervical spine region. IMPRESSION: Scarring left base. No edema or consolidation. Stable cardiac silhouette. Electronically Signed   By: Lowella Grip III M.D.   On: 06/03/2016 13:19   Mr Foot Right W Wo Contrast  06/02/2016  CLINICAL DATA:  Nonhealing wounds at the distal aspect of the right first metatarsal and between the fourth and fifth toes for 4-5 weeks. Question abscess or osteomyelitis. EXAM: MRI OF THE RIGHT FOREFOOT WITHOUT AND WITH CONTRAST TECHNIQUE: Multiplanar, multisequence MR imaging was performed both before and after administration of intravenous contrast. CONTRAST:  18 ml MULTIHANCE GADOBENATE DIMEGLUMINE 529 MG/ML IV SOLN COMPARISON:  Plain films of the right foot 05/24/2016. FINDINGS: There may be a skin wound along the head of the first metatarsal versus artifactual loss of visualization of cutaneous tissues. Artifact is favored. No abscess is identified. No bone marrow signal abnormality to suggest osteomyelitis is seen. Mild first MTP degenerative change is seen with some associated marrow edema in the medial sesamoid. Intrinsic musculature the foot demonstrates some fatty replacement with associated mildly increased T2 signal. IMPRESSION: Negative for abscess or osteomyelitis. Mild first MTP osteoarthritis. Electronically Signed   By: Inge Rise M.D.   On: 06/02/2016 16:03   Dg Foot Complete Right  05/24/2016  CLINICAL DATA:  Nonhealing wound in the first toe EXAM: RIGHT FOOT COMPLETE - 3+ VIEW COMPARISON:  None. FINDINGS: Mild degenerative changes of the first MTP joint are noted. No acute fracture or dislocation is seen. Mild degenerative changes are noted in the tarsal bones as well. No gross soft tissue abnormality is seen. No bony erosion to suggest osteomyelitis is noted. IMPRESSION: Degenerative change without  acute bony abnormality. Electronically Signed   By: Inez Catalina M.D.   On: 05/24/2016 21:18     Lab Data:  CBC:  Recent Labs Lab 06/04/16 0559 06/05/16 [id number] 06/06/16 0258 06/07/16 0451 06/08/16 0401  WBC 12.4* 12.4* 9.8 9.4 13.0*  HGB 9.8* 9.7* 9.9* 9.3* 10.6*  HCT 29.9* 29.6* 30.5* 28.6* 33.5*  MCV 82.4 81.5 82.7 83.6 85.2  PLT 270 254 268 244 [id number]   Basic Metabolic Panel:  Recent Labs Lab 06/05/16 0638 06/06/16 0258 06/06/16 2301 06/07/16 0451 06/08/16 0401  NA 132* 132* 129* 131* 134*  K 3.4* 3.5 3.5 3.6 4.0  CL 99* 96* 96* 98* 100*  CO2 23 26 24 24 22   GLUCOSE 121* 206* 200* 159* 280*  BUN 16 17 17 15 15   CREATININE 1.11 1.28* 1.24 1.25* 1.42*  CALCIUM 8.3* 8.5* 8.2* 8.2* 8.7*   GFR: Estimated Creatinine Clearance: 47.1 mL/min (by C-G formula based on Cr of 1.42). Liver Function Tests: No results for input(s): AST, ALT, ALKPHOS, BILITOT, PROT, ALBUMIN in the last 168 hours. No results for input(s): LIPASE, AMYLASE in the last 168 hours. No results for input(s): AMMONIA in the last 168 hours. Coagulation Profile:  Recent Labs Lab 06/04/16 0559 06/05/16 0958  INR 1.29 1.36   Cardiac Enzymes:  Recent Labs Lab 06/03/16 1825 06/03/16 2333 06/04/16 0559  TROPONINI 0.95* 0.94* 1.08*   BNP (last 3 results) No results for input(s): PROBNP in the last 8760 hours. HbA1C: No results for input(s): HGBA1C in the last 72 hours. CBG:  Recent Labs Lab 06/07/16 0738 06/07/16 1144 06/07/16 1503 06/07/16 2053 06/08/16 0724  GLUCAP 146* 141* 63* 377* 247*   Lipid Profile: No results for input(s): CHOL, HDL, LDLCALC, TRIG, CHOLHDL, LDLDIRECT in the last 72 hours. Thyroid Function Tests: No results for input(s): TSH,  T4TOTAL, FREET4, T3FREE, THYROIDAB in the last 72 hours. Anemia Panel:  Recent Labs  06/05/16 1441  VITAMINB12 485  FOLATE 21.4  FERRITIN 463*  TIBC 183*  IRON 22*  RETICCTPCT 1.0   Urine analysis:    Component Value Date/Time     COLORURINE YELLOW 06/06/2016 1037   APPEARANCEUR HAZY* 06/06/2016 1037   LABSPEC 1.022 06/06/2016 1037   PHURINE 5.5 06/06/2016 1037   GLUCOSEU 100* 06/06/2016 1037   HGBUR SMALL* 06/06/2016 1037   BILIRUBINUR NEGATIVE 06/06/2016 1037   KETONESUR NEGATIVE 06/06/2016 1037   PROTEINUR 100* 06/06/2016 1037   UROBILINOGEN 1.0 06/13/2015 0957   NITRITE NEGATIVE 06/06/2016 1037   LEUKOCYTESUR NEGATIVE 06/06/2016 1037    Signature  SINGH,PRASHANT K M.D on 06/08/2016 at 9:35 AM  Between 7am to 7pm - Pager - [phone], After 7pm go to [email or web address] - password [facility]  [facility]  - Office  [phone]

## 2016-06-08 NOTE — Progress Notes (Signed)
Pharmacy Antibiotic Note  Anthony Holder is a 75 y.o. male admitted on 06/03/2016 with foot infection/cellulitis.   Continue on Vancomycin and Zosyn for right foot gangrene Planning leg angioplasty 6/21  Plan: Continue vanc 1gm IV Q12H Continue Zosyn 3.375gm IV Q8H, 4 hr infusion Monitor renal fxn, clinical progress, repeat VT to r/o accumulation  Height: 5\' 10"  (177.8 cm) Weight: 186 lb 1.6 oz (84.414 kg) (pt unable to stand long enough for scale to register) IBW/kg (Calculated) : 73  Temp (24hrs), Avg:99.6 F (37.6 C), Min:98.3 F (36.8 C), Max:101 F (38.3 C)   Recent Labs Lab 06/04/16 0559 06/05/16 0638 06/05/16 1441 06/06/16 0258 06/06/16 2301 06/07/16 0451 06/08/16 0401  WBC 12.4* 12.4*  --  9.8  --  9.4 13.0*  CREATININE  --  1.11  --  1.28* 1.24 1.25* 1.42*  VANCOTROUGH  --   --  17  --   --   --   --     Estimated Creatinine Clearance: 47.1 mL/min (by C-G formula based on Cr of 1.42).    Allergies  Allergen Reactions  . Statins Other (See Comments)    Patient prefers to not take statins    Antimicrobials this admission: Vanc 6/15>> Zosyn 6/15>>  Dose adjustments this admission: 6/17 VT = 17 mcg/mL (drawn ~1.5 hrs late) > no change  Microbiology results: N/A  Thank you Anette Guarneri, PharmD [phone] 06/08/2016, 12:58 PM

## 2016-06-08 NOTE — Progress Notes (Signed)
Patient Name: Anthony Holder Date of Encounter: 06/08/2016  Primary Cardiologist: Dr. 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   Hyperlipidemia   Hx of CABG x 5 2010   Carotid artery disease-RCEA 2010   Spinal stenosis of lumbar region   BPH (benign prostatic hypertrophy)   History of CVA (cerebrovascular accident)   Moderate to severe aortic stenosis   S/P BKA (below knee amputation) unilateral (HCC)   HTN (hypertension)   PVD (peripheral vascular disease) (HCC)   Elevated troponin   Pulmonary hypertension (HCC)   Chronic systolic congestive heart failure, NYHA class 1 (HCC)   Tachycardia   ICM-25-30% March 2017 echo   Ischemic ulcer of toe of right foot ([place])   CAD in native artery   Aortic stenosis, severe   Coronary artery disease involving coronary bypass graft of native heart   Dilated aortic root ([place])    SUBJECTIVE  Denies any CP or SOB. Slept ok  CURRENT MEDS . aspirin EC  81 mg Oral Daily  . atenolol  50 mg Oral Daily  . baclofen  10 mg Oral Daily  . enoxaparin (LOVENOX) injection  30 mg Subcutaneous Q24H  . furosemide  40 mg Oral BID  . gabapentin  300 mg Oral TID  . insulin aspart  0-5 Units Subcutaneous QHS  . insulin aspart  0-9 Units Subcutaneous TID WC  . insulin NPH Human  15 Units Subcutaneous BID AC & HS  . piperacillin-tazobactam (ZOSYN)  IV  3.375 g Intravenous Q8H  . potassium chloride SA  20 mEq Oral Daily  . sodium chloride flush  3 mL Intravenous Q12H  . vancomycin  1,000 mg Intravenous Q12H    OBJECTIVE  Filed Vitals:   06/07/16 1949 06/08/16 0330 06/08/16 0525 06/08/16 0526  BP: 116/68   128/68  Pulse: 98   125  Temp: 100.5 F (38.1 C) 98.3 F (36.8 C) 101 F (38.3 C)   TempSrc: Oral Oral    Resp: 22   22  Height:      Weight:    186 lb 1.6 oz (84.414 kg)  SpO2: 95%   94%    Intake/Output Summary (Last 24 hours) at 06/08/16  0745 Last data filed at 06/07/16 2030  Gross per 24 hour  Intake    540 ml  Output    300 ml  Net    240 ml   Filed Weights   06/06/16 0520 06/07/16 0500 06/08/16 0526  Weight: 189 lb 6 oz (85.9 kg) 188 lb 11.2 oz (85.594 kg) 186 lb 1.6 oz (84.414 kg)    PHYSICAL EXAM  General: Pleasant, NAD. Neuro: Alert and oriented X 3. Moves all extremities spontaneously. Psych: Normal affect. HEENT:  Normal  Neck: Supple without bruits or JVD. Lungs:  Resp regular and unlabored. Moderately decreased breath sound bilaterally. No obvious rale Heart: RRR no s3, s4. 4/6 systolic murmur Abdomen: Soft, non-tender, non-distended, BS + x 4.  Extremities: No clubbing, cyanosis or edema. DP/PT/Radials 2+ and equal bilaterally.  Accessory Clinical Findings  CBC  Recent Labs  06/07/16 0451 06/08/16 0401  WBC 9.4 13.0*  HGB 9.3* 10.6*  HCT 28.6* 33.5*  MCV 83.6 85.2  PLT 244 [id number]   Basic Metabolic Panel  Recent Labs  06/07/16 0451 06/08/16 0401  NA 131* 134*  K 3.6 4.0  CL 98* 100*  CO2 24 22  GLUCOSE 159* 280*  BUN 15 15  CREATININE 1.25* 1.42*  CALCIUM 8.2* 8.7*    TELE Sinus tach overnight    ECG  Sinus tach with ST depression in lateral leads  Echocardiogram 02/29/2016  LV EF: 25% - 30%  ------------------------------------------------------------------- Indications: Dyspnea 786.09.  ------------------------------------------------------------------- History: PMH: Coronary artery disease. Risk factors: Septic shock. Elevated troponin. Stroke. Aortic stenosis. Diabetes mellitus.  ------------------------------------------------------------------- Study Conclusions  - Left ventricle: The cavity size was normal. Wall thickness was  normal. Systolic function was severely reduced. The estimated  ejection fraction was in the range of 25% to 30%. Akinesis of the  inferior myocardium. - Aortic valve: There was moderate to severe stenosis. Valve  area  (VTI): 0.66 cm^2. Valve area (Vmax): 0.62 cm^2. Valve area  (Vmean): 0.58 cm^2. - Left atrium: The atrium was mildly dilated. - Pulmonary arteries: Systolic pressure was moderately increased.  PA peak pressure: 54 mm Hg (S).    Radiology/Studies  Dg Chest 2 View  06/05/2016  CLINICAL DATA:  Shortness breath EXAM: CHEST  2 VIEW COMPARISON:  06/03/2016 chest radiograph. FINDINGS: Sternotomy wires appear aligned and intact. CABG clips overlie the mediastinum. Partially visualized surgical hardware from ACDF overlying the lower cervical spine. Stable cardiomediastinal silhouette with mild cardiomegaly. No pneumothorax. Stable trace left pleural effusion. No right pleural effusion. No pulmonary edema. No acute consolidative airspace disease. Stable mild left basilar atelectasis. IMPRESSION: 1. Stable mild cardiomegaly without pulmonary edema. 2. Stable trace left pleural effusion and mild left basilar atelectasis. Electronically Signed   By: Ilona Sorrel M.D.   On: 06/05/2016 12:26   Dg Chest 2 View  06/03/2016  CLINICAL DATA:  Chest pain EXAM: CHEST  2 VIEW COMPARISON:  March 03, 2016 FINDINGS: There is mild scarring in the left base. Lungs elsewhere are clear. Heart is upper normal in size with pulmonary vascularity within normal limits. Patient is status post coronary artery bypass grafting. No adenopathy. There is postoperative change in the lower cervical spine region. IMPRESSION: Scarring left base. No edema or consolidation. Stable cardiac silhouette. Electronically Signed   By: Lowella Grip III M.D.   On: 06/03/2016 13:19   Mr Foot Right W Wo Contrast  06/02/2016  CLINICAL DATA:  Nonhealing wounds at the distal aspect of the right first metatarsal and between the fourth and fifth toes for 4-5 weeks. Question abscess or osteomyelitis. EXAM: MRI OF THE RIGHT FOREFOOT WITHOUT AND WITH CONTRAST TECHNIQUE: Multiplanar, multisequence MR imaging was performed both before and after  administration of intravenous contrast. CONTRAST:  18 ml MULTIHANCE GADOBENATE DIMEGLUMINE 529 MG/ML IV SOLN COMPARISON:  Plain films of the right foot 05/24/2016. FINDINGS: There may be a skin wound along the head of the first metatarsal versus artifactual loss of visualization of cutaneous tissues. Artifact is favored. No abscess is identified. No bone marrow signal abnormality to suggest osteomyelitis is seen. Mild first MTP degenerative change is seen with some associated marrow edema in the medial sesamoid. Intrinsic musculature the foot demonstrates some fatty replacement with associated mildly increased T2 signal. IMPRESSION: Negative for abscess or osteomyelitis. Mild first MTP osteoarthritis. Electronically Signed   By: Inge Rise M.D.   On: 06/02/2016 16:03   Dg Foot Complete Right  05/24/2016  CLINICAL DATA:  Nonhealing wound in the first toe EXAM: RIGHT FOOT COMPLETE - 3+ VIEW COMPARISON:  None. FINDINGS: Mild degenerative changes of the first MTP joint are noted. No acute fracture or dislocation is seen. Mild degenerative changes are noted in the tarsal  bones as well. No gross soft tissue abnormality is seen. No bony erosion to suggest osteomyelitis is noted. IMPRESSION: Degenerative change without acute bony abnormality. Electronically Signed   By: Inez Catalina M.D.   On: 05/24/2016 21:18    ASSESSMENT AND PLAN  75 yo male with PMH of CAD s/p CABGx5 with RCEA in 2010, DM with neuropathy, CRI-2, PAD s/p L BKA 2013 2/2 chronic diabetic foot infection presented with R foot pain 2/2 severe RLE arterial disease. Previously in March 2017, he was noted to have new LV dysfunction with EF 25-30% with moderate to severe AS.   1. Elevated trop with new cardiomyopathy  - L&RHC 06/07/2016 severe multivessel native CAD with 70% LM, 100% mid LAD, 100% mid RCA, patent LIMA to LAD, patent seq SVG to OM2/OM3, patent SVG to dist RCA, occluded SVG to ramus. LVEDP 31-20mm. PW 22. CO 5.1. CI 2.5. Recommended  surgical consultation.   - unclear cause for persistent ST depression in lateral leads seen on today's EKG during tachycardia, ?related to 90% prox to mid LCx prior to the 100% mid LCx occlusion?  - consider change atenolol to coreg. Mild acute on chronic renal insufficiency after cath, once renal function back to baseline, may consider add ACEI  - sinus tach and fever overnight, but patient says he slept great, unclear cause  2. Severe AS  - Mean gradient 56 mmHg despite ejection fraction of 25-30% on cath.   - Called Dr Prescott Gum.   3. CAD s/p CABGx5 2010  4. PAD with R food ulcer 2/2 severe RLE arterial disease, h/o L BKA  - seen by Dr. Scot Dock on 6/16 and by [name] on 6/17, ?limb salvage    Signed, Almyra Deforest PA-C Pager: [phone]  Personally seen and examined. Agree with above.  75 year old with severe/critical aortic stenosis, CABG (ramus graft occluded), severe PVD, left BKA, right foot dry gangrene, fever unclear source.   - Changed atenolol 50 to coreg 12.5 BID.   - Dr. Trula Slade, SFA PTCA tomorrow, reviewed [name] note  - Was fatigued this AM, kept falling asleep during discussion with wife.   - Concerning overall prognosis (EF 25%, severe AS, symptomatic, 1 year mortality up to 50%) wife aware.   Candee Furbish, MD

## 2016-06-09 ENCOUNTER — Encounter (HOSPITAL_COMMUNITY)
Admission: EM | Disposition: A | Discharge status: Skilled Nursing Facility | Payer: Self-pay | Source: Home / Self Care | Attending: Family Medicine

## 2016-06-09 DIAGNOSIS — I10 Essential (primary) hypertension: Secondary | ICD-10-CM

## 2016-06-09 DIAGNOSIS — I255 Ischemic cardiomyopathy: Secondary | ICD-10-CM

## 2016-06-09 HISTORY — PX: PERIPHERAL VASCULAR CATHETERIZATION: SHX172C

## 2016-06-09 LAB — PROTIME-INR
INR: 1.55 — AB (ref 0.00–1.49)
Prothrombin Time: 18.7 seconds — ABNORMAL HIGH (ref 11.6–15.2)

## 2016-06-09 LAB — BASIC METABOLIC PANEL
ANION GAP: 10 (ref 5–15)
BUN: 20 mg/dL (ref 6–20)
CALCIUM: 8.7 mg/dL — AB (ref 8.9–10.3)
CO2: 26 mmol/L (ref 22–32)
CREATININE: 1.79 mg/dL — AB (ref 0.61–1.24)
Chloride: 101 mmol/L (ref 101–111)
GFR calc Af Amer: 41 mL/min — ABNORMAL LOW (ref >60)
GFR calc non Af Amer: 36 mL/min — ABNORMAL LOW (ref >60)
Glucose, Bld: 151 mg/dL — ABNORMAL HIGH (ref 65–99)
Potassium: 3.6 mmol/L (ref 3.5–5.1)
Sodium: 137 mmol/L (ref 135–145)

## 2016-06-09 LAB — GLUCOSE, CAPILLARY
GLUCOSE-CAPILLARY: 138 mg/dL — AB (ref 65–99)
GLUCOSE-CAPILLARY: 126 mg/dL — AB (ref 65–99)
GLUCOSE-CAPILLARY: 159 mg/dL — AB (ref 65–99)
GLUCOSE-CAPILLARY: 233 mg/dL — AB (ref 65–99)

## 2016-06-09 LAB — CBC
HCT: 27.8 % — ABNORMAL LOW (ref 39.0–52.0)
Hemoglobin: 8.9 g/dL — ABNORMAL LOW (ref 13.0–17.0)
MCH: 26.6 pg (ref 26.0–34.0)
MCHC: 32 g/dL (ref 30.0–36.0)
MCV: 83 fL (ref 78.0–100.0)
PLATELETS: 237 10*3/uL (ref 150–400)
RBC: 3.35 MIL/uL — AB (ref 4.22–5.81)
RDW: 15.3 % (ref 11.5–15.5)
WBC: 12.4 10*3/uL — AB (ref 4.0–10.5)

## 2016-06-09 LAB — POCT ACTIVATED CLOTTING TIME
ACTIVATED CLOTTING TIME: 213 s
ACTIVATED CLOTTING TIME: 186 s
Activated Clotting Time: 252 seconds
Activated Clotting Time: 279 seconds
Activated Clotting Time: 175 seconds

## 2016-06-09 SURGERY — ABDOMINAL AORTOGRAM
Anesthesia: LOCAL | Laterality: Right

## 2016-06-09 MED ORDER — LIDOCAINE HCL (PF) 1 % IJ SOLN
INTRAMUSCULAR | Status: AC
  Filled 2016-06-09: qty 30

## 2016-06-09 MED ORDER — IODIXANOL 320 MG/ML IV SOLN
INTRAVENOUS | Status: DC | PRN
  Administered 2016-06-09: 75 mL via INTRAVENOUS

## 2016-06-09 MED ORDER — HEPARIN SODIUM (PORCINE) 1000 UNIT/ML IJ SOLN
INTRAMUSCULAR | Status: DC | PRN
  Administered 2016-06-09: 1000 [IU] via INTRAVENOUS
  Administered 2016-06-09: 9000 [IU] via INTRAVENOUS
  Administered 2016-06-09: 2000 [IU] via INTRAVENOUS

## 2016-06-09 MED ORDER — HEPARIN SODIUM (PORCINE) 1000 UNIT/ML IJ SOLN
INTRAMUSCULAR | Status: AC
  Filled 2016-06-09: qty 1

## 2016-06-09 MED ORDER — MAGNESIUM SULFATE 2 GM/50ML IV SOLN: 2.0000 g | Freq: Every day | INTRAVENOUS | Status: DC | PRN

## 2016-06-09 MED ORDER — PHENOL 1.4 % MT LIQD: 1.0000 | OROMUCOSAL | Status: DC | PRN

## 2016-06-09 MED ORDER — LABETALOL HCL 5 MG/ML IV SOLN: 10.0000 mg | INTRAVENOUS | Status: DC | PRN

## 2016-06-09 MED ORDER — HEPARIN (PORCINE) IN NACL 2-0.9 UNIT/ML-% IJ SOLN
INTRAMUSCULAR | Status: AC
  Filled 2016-06-09: qty 1000

## 2016-06-09 MED ORDER — POTASSIUM CHLORIDE CRYS ER 20 MEQ PO TBCR
20.0000 meq | EXTENDED_RELEASE_TABLET | Freq: Every day | ORAL | Status: DC | PRN
  Filled 2016-06-09: qty 1

## 2016-06-09 MED ORDER — METOPROLOL TARTRATE 5 MG/5ML IV SOLN: 2.0000 mg | INTRAVENOUS | Status: DC | PRN

## 2016-06-09 MED ORDER — ACETAMINOPHEN 325 MG PO TABS
325.0000 mg | ORAL_TABLET | ORAL | Status: DC | PRN
  Administered 2016-06-12: 650 mg via ORAL
  Filled 2016-06-09: qty 2

## 2016-06-09 MED ORDER — HYDRALAZINE HCL 20 MG/ML IJ SOLN: 5.0000 mg | INTRAMUSCULAR | Status: DC | PRN

## 2016-06-09 MED ORDER — VERAPAMIL HCL 2.5 MG/ML IV SOLN
INTRAVENOUS | Status: AC
  Filled 2016-06-09: qty 2

## 2016-06-09 MED ORDER — SODIUM CHLORIDE 0.9 % IV SOLN: 500.0000 mL | Freq: Once | INTRAVENOUS | Status: DC | PRN

## 2016-06-09 MED ORDER — GUAIFENESIN-DM 100-10 MG/5ML PO SYRP: 15.0000 mL | ORAL_SOLUTION | ORAL | Status: DC | PRN

## 2016-06-09 MED ORDER — PANTOPRAZOLE SODIUM 40 MG PO TBEC
40.0000 mg | DELAYED_RELEASE_TABLET | Freq: Every day | ORAL | Status: DC
  Administered 2016-06-09 – 2016-06-17 (×9): 40 mg via ORAL
  Filled 2016-06-09 (×9): qty 1

## 2016-06-09 MED ORDER — ONDANSETRON HCL 4 MG/2ML IJ SOLN: 4.0000 mg | Freq: Four times a day (QID) | INTRAMUSCULAR | Status: DC | PRN

## 2016-06-09 MED ORDER — NITROGLYCERIN IN D5W 200-5 MCG/ML-% IV SOLN
INTRAVENOUS | Status: AC
  Filled 2016-06-09: qty 250

## 2016-06-09 MED ORDER — DOCUSATE SODIUM 100 MG PO CAPS
100.0000 mg | ORAL_CAPSULE | Freq: Every day | ORAL | Status: DC
  Administered 2016-06-10 – 2016-06-17 (×8): 100 mg via ORAL
  Filled 2016-06-09 (×8): qty 1

## 2016-06-09 MED ORDER — ACETAMINOPHEN 325 MG PO TABS
ORAL_TABLET | ORAL | Status: AC
  Filled 2016-06-09: qty 2

## 2016-06-09 MED ORDER — VIPERSLIDE LUBRICANT OPTIME
TOPICAL | Status: DC | PRN
  Administered 2016-06-09: 10:00:00 via SURGICAL_CAVITY

## 2016-06-09 MED ORDER — HEPARIN (PORCINE) IN NACL 2-0.9 UNIT/ML-% IJ SOLN
INTRAMUSCULAR | Status: DC | PRN
  Administered 2016-06-09: 1000 mL

## 2016-06-09 MED ORDER — ACETAMINOPHEN 650 MG RE SUPP: 325.0000 mg | RECTAL | Status: DC | PRN

## 2016-06-09 SURGICAL SUPPLY — 25 items
BALLN COYOTE OTW 3X220X150 (BALLOONS) ×3
BALLN LUTONIX 5X150X130 (BALLOONS) ×6
BALLOON COYOTE OTW 3X220X150 (BALLOONS) IMPLANT
BALLOON LUTONIX 5X150X130 (BALLOONS) IMPLANT
CATH ANGIO 5F BER2 65CM (CATHETERS) ×1 IMPLANT
CATH CXI SUPP ANG 2.6FR 150CM (MICROCATHETER) ×1 IMPLANT
CATH OMNI FLUSH 5F 65CM (CATHETERS) ×1 IMPLANT
CROWN STEALTH MICRO-30 1.25MM (CATHETERS) ×1 IMPLANT
DEVICE CONTINUOUS FLUSH (MISCELLANEOUS) ×1 IMPLANT
KIT ENCORE 26 ADVANTAGE (KITS) ×1 IMPLANT
KIT MICROINTRODUCER STIFF 5F (SHEATH) ×1 IMPLANT
KIT PV (KITS) ×3 IMPLANT
LUBRICANT VIPERSLIDE CORONARY (MISCELLANEOUS) ×1 IMPLANT
SHEATH FLEX ANSEL ST 6FR 45CM (SHEATH) ×1 IMPLANT
SHEATH PINNACLE 5F 10CM (SHEATH) ×1 IMPLANT
SHIELD RADPAD SCOOP 12X17 (MISCELLANEOUS) ×2 IMPLANT
SYR MEDRAD MARK V 150ML (SYRINGE) ×3 IMPLANT
TAPE RADIOPAQUE TURBO (MISCELLANEOUS) ×2 IMPLANT
TRANSDUCER W/STOPCOCK (MISCELLANEOUS) ×3 IMPLANT
TRAY PV CATH (CUSTOM PROCEDURE TRAY) ×3 IMPLANT
WIRE APROACH 18G .014X300CM (WIRE) ×1 IMPLANT
WIRE BENTSON .035X145CM (WIRE) ×1 IMPLANT
WIRE COUGAR XT STRL 300CM (WIRE) ×1 IMPLANT
WIRE ROSEN-J .035X180CM (WIRE) ×1 IMPLANT
WIRE VIPER ADVANCE .017X335CM (WIRE) ×1 IMPLANT

## 2016-06-09 NOTE — Progress Notes (Signed)
Site area: Scientific laboratory technician Prior to Removal:  Level 0 Pressure Applied For: 62min Manual:   yes Patient Status During Pull:  A/O Post Pull Site:  Level 0 Post Pull Instructions Given:  Post instructions given and pt understands Post Pull Pulses Present: doppler rt pt/dp Dressing Applied:  tegaderm and a 4x4 Bedrest begins @ 15:55:0 Comments: pt leaves cath lab holding in stable cindition. Rt groin unremarkable. Dressing is CDI.

## 2016-06-09 NOTE — Progress Notes (Signed)
[facility]                                                                              Patient Demographics  Anthony Holder, is a 75 y.o. male, DOB - 10/04/1941, [id number]  Admit date - 06/03/2016   Admitting Physician Albertine Patricia, MD  Outpatient Primary MD for the patient is [name], MD  Outpatient specialists:   LOS -   days   Chief Complaint  Patient presents with  . Tachycardia  . Toe Pain       Brief summary   Patient is a 75 year old male with severe PVD,with prior left BKA and known ischemic ulcers right foot, chronic systolic CHF, AS with PAH, hypertension, history of CAD/ CABG 5 in 2010, DM, BPH, dyslipidemia and history of CVA. Patient recently underwent aortogram with right lower extremity runoff and may was found to have significant disease that was not amenable to intervention or surgery and medical therapy was opted for by the vascular surgeon. The past 2 days he noticed increased darkening of the pinky toe of the right foot with pain. He also noticed palpitations. He underwent an outpatient MRI on 6/14 that showed negative findings regarding abscess or osteomyelitis. During further workup patient underwent left heart cath showing multivessel disease and severe aortic stenosis along with severe systolic dysfunction with EF 25%, cardiology following, he also had evidence of right foot fifth toe dry gangrene with surrounding cellulitis and severe PAD. Vascular surgery following. Pt had right leg angioplasty on 06/09/2016, cardiology, vascular surgery following. Cardiology is consulted in cardiothoracic surgery for multivessel disease and severe AS.  Cardiothoracic surgery following for possible TAVR.    Assessment & Plan   Elevated troponin/Hx of CABG x 5 2010/abnormal EKG, cardiomyopathy, palpitations - Patient without chest pain, troponin trended up to 1.08, new ST segment downsloping and T-wave inversion on EKG. Cardiology  consulted, per Dr. Percival Spanish, needs a right and left cardiac cath, Cardiology following. Currently chest pain-free, on aspirin, beta blocker for secondary prevention, has statin allergy. He underwent catheterization on 06/07/2016 showing multivessel disease, EF of 25% along with severe AS. Cardiology following and consulting cardiothoracic surgery for input.  Aortic stenosis, moderate/Pulmonary hypertension - as above.   Mild acute on Chronic systolic congestive heart failure, NYHA class 1 EF 25%. With moderate to severe AS. Currently symptom-free, blood pressure is borderline, on beta blocker and Lasix, not on ACE inhibitor or ARB due to soft blood pressures and moderate to severe AS. On 06/08/2016 he was mildly short of breath as he received IV fluids for cardiac catheterization prep, extra Lasix today and monitor.  PVD (peripheral vascular disease)/S/P BKA (below knee amputation) unilateral/ Ischemic ulcer of right foot with dry gangrene of the fifth toe and then around the first metatarsal head with some surrounding cellulitis  - blood cultures NGTD, no other source of infection, UA unremarkable, chest x-ray unimpressive, Continue IV vancomycin and Zosyn. Vascular surgery following, discussed his case with [name] on 06/08/2016, vascular surgery will attempt right leg angioplasty on 06/09/2016. For now no plans of amputation.  Pt had endovascular procedures on 6/21 below:  #1  successful atherectomy and angioplasty of a occluded right peroneal artery using a CSI 1.25 device and a coyote 3 mm balloon. Residual stenosis was less than 10% #2 successful atherectomy and drug coated balloon angioplasty of the right superficial femoral and popliteal artery treating 90% stenosis. Residual stenosis was less than 10%. #3 2 non-flow-limiting dissections were seen in the popliteal superficial femoral artery. I did not feel needed intervention #4 a total of 75 cc of  contrast were utilized  Plan to continue zosyn and vancomycin for now.  Blood cultures no growth to date.    HTN (hypertension) -Blood pressure currently well-controlled on Tenormin  Hyperlipidemia -Continue omega-3 fatty acids  BPH (benign prostatic hypertrophy)-Monitor for acute urinary retention  History of CVA (cerebrovascular accident)-Continue aspirin, Patient with documented statin allergy  Anemia - likely AOCD -  Follow H&H and FOBT, anemia panel inconclusive but most consistent with iron deficiency, once infection/bacteremia is ruled out will place on iron.  CKD 3 - close to baseline creat of 1.2.  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     Lab Results  Component Value Date   HGBA1C 8.0* 06/03/2016   CBG (last 3)   Recent Labs  06/08/16 2129 06/09/16 0735 06/09/16 1406  GLUCAP 227* 138* 126*    Code Status: Full CODE STATUS DVT Prophylaxis:  Lovenox  Family Communication: Discussed with the patient and wife Daily bedside  Disposition Plan: Stay inpatient   Procedures:   TTE - Left ventricle: The cavity size was normal. Wall thickness was normal. Systolic function was severely reduced. The estimated ejection fraction was in the range of 25% to 30%. Akinesis of the inferior myocardium. - Aortic valve: There was moderate to severe stenosis. Valve area (VTI): 0.66 cm^2. Valve area (Vmax): 0.62 cm^2. Valve area (Vmean): 0.58 cm^2. - Left atrium: The atrium was mildly dilated. - Pulmonary arteries: Systolic pressure was moderately increased. PA peak pressure: 54 mm Hg (S).   MR R foot - no osteomyelitis or abscess.  L heart Cath 06-07-16 - multivessel CAD with severe aortic stenosis  Consultants:    Cardiology Vascular surgery Cardiology Has consulted Dr. Lucianne Lei tright cardiothoracic surgery  Antibiotics :   Anti-infectives    Start     Dose/Rate Route  Frequency Ordered Stop   06/03/16 1400  piperacillin-tazobactam (ZOSYN) IVPB 3.375 g     3.375 g 12.5 mL/hr over 240 Minutes Intravenous Every 8 hours 06/03/16 1352     06/03/16 1400  vancomycin (VANCOCIN) IVPB 1000 mg/200 mL premix     1,000 mg 200 mL/hr over 60 Minutes Intravenous Every 12 hours 06/03/16 1352       Medications  Scheduled Meds: . aspirin EC  81 mg Oral Daily  . baclofen  10 mg Oral Daily  . carvedilol  12.5 mg Oral BID WC  . [START ON 06/10/2016] enoxaparin (LOVENOX) injection  40 mg Subcutaneous Q24H  . furosemide  40 mg Oral BID  . gabapentin  300 mg Oral TID  . insulin aspart  0-5 Units Subcutaneous QHS  . insulin aspart  0-9 Units Subcutaneous TID WC  . insulin aspart  3 Units Subcutaneous TID WC  . insulin NPH Human  20 Units Subcutaneous BID AC & HS  . piperacillin-tazobactam (ZOSYN)  IV  3.375 g Intravenous Q8H  . potassium chloride SA  20 mEq Oral Daily  . sodium chloride flush  3 mL  Intravenous Q12H  . vancomycin  1,000 mg Intravenous Q12H   Continuous Infusions: . dextrose 50 mL/hr at 06/09/16 0547   PRN Meds:.sodium chloride, acetaminophen, diazepam, gi cocktail, morphine injection, ondansetron (ZOFRAN) IV, sodium chloride flush, traMADol, zolpidem   Subjective:   Demauri Lumsden  Pt was seen post-op and recovering well, ready to order a diet, dopplers positive at bedside  Objective:   Filed Vitals:   06/09/16 1530 06/09/16 1540 06/09/16 1545 06/09/16 1600  BP: 127/108 114/63 104/57 89/50  Pulse: 80 77 76 74  Temp:      TempSrc:      Resp: 21 23 17 24   Height:      Weight:      SpO2: 96% 96% 95% 94%    Intake/Output Summary (Last 24 hours) at 06/09/16 1629 Last data filed at 06/09/16 0835  Gross per 24 hour  Intake    790 ml  Output    600 ml  Net    190 ml     Wt Readings from Last 3 Encounters:  06/09/16 191 lb 3.2 oz (86.728 kg)  05/10/16 190 lb (86.183 kg)  05/06/16 196 lb (88.905 kg)     Exam  General: Alert and  oriented x 3, NAD  HEENT:    Neck: Supple, no JVD, no masses  Cardiovascular: S1 S2 auscultated, no rubs, Loud aortic systolic murmur, Regular rate and rhythm.  Respiratory: Clear to auscultation bilaterally, no wheezing, rales or rhonchi  Gastrointestinal: Soft, nontender, nondistended, + bowel sounds  Ext: no cyanosis clubbing, left BKA  Neuro: no new deficit  Skin: Right 4,5 and great toe dry gangrene with eschar, cellulitis on the surrounding area  Psych: Normal affect and demeanor, alert and oriented x3    Data Reviewed:  I have personally reviewed following labs and imaging studies  Micro Results Recent Results (from the past 240 hour(s))  Urine culture     Status: None   Collection Time: 06/06/16 10:37 AM  Result Value Ref Range Status   Specimen Description URINE, RANDOM  Final   Special Requests NONE  Final   Culture NO GROWTH  Final   Report Status 06/07/2016 FINAL  Final  Culture, blood (Routine X 2) w Reflex to ID Panel     Status: None (Preliminary result)   Collection Time: 06/06/16  5:09 PM  Result Value Ref Range Status   Specimen Description BLOOD RIGHT HAND  Final   Special Requests BOTTLES DRAWN AEROBIC AND ANAEROBIC 5CC  Final   Culture NO GROWTH 3 DAYS  Final   Report Status PENDING  Incomplete  Culture, blood (Routine X 2) w Reflex to ID Panel     Status: None (Preliminary result)   Collection Time: 06/06/16  5:09 PM  Result Value Ref Range Status   Specimen Description BLOOD RIGHT HAND  Final   Special Requests IN PEDIATRIC BOTTLE 5CC  Final   Culture NO GROWTH 3 DAYS  Final   Report Status PENDING  Incomplete    Radiology Reports Dg Chest 2 View  06/05/2016  CLINICAL DATA:  Shortness breath EXAM: CHEST  2 VIEW COMPARISON:  06/03/2016 chest radiograph. FINDINGS: Sternotomy wires appear aligned and intact. CABG clips overlie the mediastinum. Partially visualized surgical hardware from ACDF overlying the lower cervical spine. Stable  cardiomediastinal silhouette with mild cardiomegaly. No pneumothorax. Stable trace left pleural effusion. No right pleural effusion. No pulmonary edema. No acute consolidative airspace disease. Stable mild left basilar atelectasis. IMPRESSION: 1. Stable mild cardiomegaly without  pulmonary edema. 2. Stable trace left pleural effusion and mild left basilar atelectasis. Electronically Signed   By: Ilona Sorrel M.D.   On: 06/05/2016 12:26   Dg Chest 2 View  06/03/2016  CLINICAL DATA:  Chest pain EXAM: CHEST  2 VIEW COMPARISON:  March 03, 2016 FINDINGS: There is mild scarring in the left base. Lungs elsewhere are clear. Heart is upper normal in size with pulmonary vascularity within normal limits. Patient is status post coronary artery bypass grafting. No adenopathy. There is postoperative change in the lower cervical spine region. IMPRESSION: Scarring left base. No edema or consolidation. Stable cardiac silhouette. Electronically Signed   By: Lowella Grip III M.D.   On: 06/03/2016 13:19   Mr Foot Right W Wo Contrast  06/02/2016  CLINICAL DATA:  Nonhealing wounds at the distal aspect of the right first metatarsal and between the fourth and fifth toes for 4-5 weeks. Question abscess or osteomyelitis. EXAM: MRI OF THE RIGHT FOREFOOT WITHOUT AND WITH CONTRAST TECHNIQUE: Multiplanar, multisequence MR imaging was performed both before and after administration of intravenous contrast. CONTRAST:  18 ml MULTIHANCE GADOBENATE DIMEGLUMINE 529 MG/ML IV SOLN COMPARISON:  Plain films of the right foot 05/24/2016. FINDINGS: There may be a skin wound along the head of the first metatarsal versus artifactual loss of visualization of cutaneous tissues. Artifact is favored. No abscess is identified. No bone marrow signal abnormality to suggest osteomyelitis is seen. Mild first MTP degenerative change is seen with some associated marrow edema in the medial sesamoid. Intrinsic musculature the foot demonstrates some fatty  replacement with associated mildly increased T2 signal. IMPRESSION: Negative for abscess or osteomyelitis. Mild first MTP osteoarthritis. Electronically Signed   By: Inge Rise M.D.   On: 06/02/2016 16:03   Dg Foot Complete Right  05/24/2016  CLINICAL DATA:  Nonhealing wound in the first toe EXAM: RIGHT FOOT COMPLETE - 3+ VIEW COMPARISON:  None. FINDINGS: Mild degenerative changes of the first MTP joint are noted. No acute fracture or dislocation is seen. Mild degenerative changes are noted in the tarsal bones as well. No gross soft tissue abnormality is seen. No bony erosion to suggest osteomyelitis is noted. IMPRESSION: Degenerative change without acute bony abnormality. Electronically Signed   By: Inez Catalina M.D.   On: 05/24/2016 21:18     Lab Data:  CBC:  Recent Labs Lab 06/06/16 0258 06/07/16 0451 06/08/16 0401 06/08/16 1549 06/09/16 0430  WBC 9.8 9.4 13.0* 12.4* 12.4*  HGB 9.9* 9.3* 10.6* 9.6* 8.9*  HCT 30.5* 28.6* 33.5* 30.4* 27.8*  MCV 82.7 83.6 85.2 84.7 83.0  PLT 268 244 302 256 [id number]   Basic Metabolic Panel:  Recent Labs Lab 06/06/16 0258 06/06/16 2301 06/07/16 0451 06/08/16 0401 06/09/16 0430  NA 132* 129* 131* 134* 137  K 3.5 3.5 3.6 4.0 3.6  CL 96* 96* 98* 100* 101  CO2 26 24 24 22 26   GLUCOSE 206* 200* 159* 280* 151*  BUN 17 17 15 15 20   CREATININE 1.28* 1.24 1.25* 1.42* 1.79*  CALCIUM 8.5* 8.2* 8.2* 8.7* 8.7*   GFR: Estimated Creatinine Clearance: 37.4 mL/min (by C-G formula based on Cr of 1.79). Liver Function Tests: No results for input(s): AST, ALT, ALKPHOS, BILITOT, PROT, ALBUMIN in the last 168 hours. No results for input(s): LIPASE, AMYLASE in the last 168 hours. No results for input(s): AMMONIA in the last 168 hours. Coagulation Profile:  Recent Labs Lab 06/04/16 0559 06/05/16 0958 06/09/16 0430  INR 1.29 1.36 1.55*   Cardiac  Enzymes:  Recent Labs Lab 06/03/16 1825 06/03/16 2333 06/04/16 0559  TROPONINI 0.95* 0.94* 1.08*    BNP (last 3 results) No results for input(s): PROBNP in the last 8760 hours. HbA1C: No results for input(s): HGBA1C in the last 72 hours. CBG:  Recent Labs Lab 06/08/16 1129 06/08/16 1625 06/08/16 2129 06/09/16 0735 06/09/16 1406  GLUCAP 254* 245* 227* 138* 126*   Lipid Profile: No results for input(s): CHOL, HDL, LDLCALC, TRIG, CHOLHDL, LDLDIRECT in the last 72 hours. Thyroid Function Tests: No results for input(s): TSH, T4TOTAL, FREET4, T3FREE, THYROIDAB in the last 72 hours. Anemia Panel: No results for input(s): VITAMINB12, FOLATE, FERRITIN, TIBC, IRON, RETICCTPCT in the last 72 hours. Urine analysis:    Component Value Date/Time   COLORURINE YELLOW 06/06/2016 1037   APPEARANCEUR HAZY* 06/06/2016 1037   LABSPEC 1.022 06/06/2016 1037   PHURINE 5.5 06/06/2016 1037   GLUCOSEU 100* 06/06/2016 1037   HGBUR SMALL* 06/06/2016 1037   BILIRUBINUR NEGATIVE 06/06/2016 1037   KETONESUR NEGATIVE 06/06/2016 1037   PROTEINUR 100* 06/06/2016 1037   UROBILINOGEN 1.0 06/13/2015 0957   NITRITE NEGATIVE 06/06/2016 1037   LEUKOCYTESUR NEGATIVE 06/06/2016 1037    Signature  Clanford Johnson M.D on 06/09/2016 at 4:29 PM  Between 7am to 7pm - Pager - [phone], After 7pm go to [email or web address] - password [facility]  [facility]  - Office  [phone]

## 2016-06-09 NOTE — Plan of Care (Addendum)
Problem: Physical Regulation: Goal: Will remain free from infection Outcome: Not Progressing The client continues to get a fever every night that has increased peaking at this time at 102.2 fahrenheit last night. Tylenol decrease the fever but it has returned night after night for the past three nights. He is going for vascular surgery today for arteriogram and possible intervention on the right lower extremity with angioplasty and possible stenting for critical limb ischemia of right foot with gangrene.

## 2016-06-09 NOTE — Plan of Care (Signed)
Problem: Activity: Goal: Risk for activity intolerance will decrease Outcome: Not Progressing The client is becoming weaker and is not able to stand long enough to get his daily standing weights.

## 2016-06-09 NOTE — Op Note (Addendum)
Patient name: Anthony Holder MRN: [id number] DOB: December 26, 1940 Sex: male  06/03/2016 - 06/09/2016 Pre-operative Diagnosis: Right leg ulcer Post-operative diagnosis:  Same Surgeon:  Annamarie Major Procedure Performed:  1.  Antegrade access, right femoral artery  2.  Right lower extremity runoff  3.  Atherectomy with balloon angioplasty, right peroneal and tibioperoneal trunk  4.  Atherectomy with drug coated balloon angioplasty, right popliteal and superficial femoral artery    Indications:  The patient suffers from ischemic ulcers to his right foot.  He is at high risk for amputation.  Recent angiography shows severe multivessel disease.  The patient is not operative candidate because of his cardiac risk factors.  He comes in today for limb salvage procedure from an endovascular approach  Procedure:  The patient was identified in the holding area and taken to room 8.  The patient was then placed supine on the table and prepped and draped in the usual sterile fashion.  A time out was called.  Ultrasound was used to evaluate the right common femoral artery.  It was patent .  A digital ultrasound image was acquired.  A micropuncture needle was used to access the right common femoral artery under ultrasound guidance and a and take rate approach.  An 018 wire was advanced without resistance and a micropuncture sheath was placed.  The 018 wire was removed and a benson wire was placed.  The micropuncture sheath was exchanged for a 5 french sheath.  A Berenstein 2 catheter and a Benson wire were used to select the right superficial femoral artery.  Once this was done, a 6 x 45 Ansell 1 sheath was inserted into the right superficial femoral artery.  The patient was fully heparinized.  Next, using a CSI 014 angled glide catheter and a cougar wire, the stenoses within the superficial femoral artery and popliteal artery were successfully crossed.  I had difficulty crossing the tibioperoneal trunk/peroneal  artery origin stenosis/occlusion and therefore I switched out to a 18 g 014 Approach wire and was able to successfully cross the peroneal occlusion.  The catheter was advanced into the distal peroneal artery and a contrast injection was performed which confirmed successful reentry.  A Viper wire was then inserted.  I then selected a CSI 1.25 device and performed atherectomy at low medium and high speeds from the proximal peroneal artery into the tibioperoneal trunk and popliteal artery as well as across the lesions in the superficial femoral artery.  I selected a 3 x 2 2001 4 coronary balloon and performed a primary balloon angioplasty of the peroneal, tibioperoneal trunk and below-knee popliteal artery.  I then selected a drug coated Lutonix 5 x 1 50 balloon 2 to perform drug coated balloon angioplasty within the popliteal and superficial femoral artery.  Completion angiography was then performed.  This showed patent in line flow from the groin to across the ankle.  There was a non-flow-limiting dissection in the distal superficial femoral artery as well as in the popliteal artery.  Because these were non-flow-limiting a did not think further intervention was needed.  Catheters and wires were removed.  The patient taken the holding area for sheath pull once the coagulation profile corrects   Impression:  #1  successful atherectomy and angioplasty of a occluded right peroneal artery using a CSI 1.25 device and a coyote 3 mm balloon.  Residual stenosis was less than 10%  #2  successful atherectomy and drug coated balloon angioplasty of the right superficial femoral and popliteal artery  treating 90% stenosis.  Residual stenosis was less than 10%.  #3  2 non-flow-limiting dissections were seen in the popliteal superficial femoral artery.  I did not feel needed intervention  #4  a total of 75 cc of contrast were utilized   V. Annamarie Major, M.D. [facility] Office:  [phone] Pager:  [phone]

## 2016-06-10 ENCOUNTER — Encounter (HOSPITAL_COMMUNITY): Payer: Self-pay | Admitting: Surgery

## 2016-06-10 LAB — COMPREHENSIVE METABOLIC PANEL
ALBUMIN: 2 g/dL — AB (ref 3.5–5.0)
ALT: 21 U/L (ref 17–63)
AST: 30 U/L (ref 15–41)
Alkaline Phosphatase: 51 U/L (ref 38–126)
Anion gap: 8 (ref 5–15)
BUN: 20 mg/dL (ref 6–20)
CHLORIDE: 99 mmol/L — AB (ref 101–111)
CO2: 25 mmol/L (ref 22–32)
CREATININE: 1.7 mg/dL — AB (ref 0.61–1.24)
Calcium: 8.1 mg/dL — ABNORMAL LOW (ref 8.9–10.3)
GFR calc Af Amer: 44 mL/min — ABNORMAL LOW (ref >60)
GFR, EST NON AFRICAN AMERICAN: 38 mL/min — AB (ref >60)
GLUCOSE: 157 mg/dL — AB (ref 65–99)
Potassium: 3.5 mmol/L (ref 3.5–5.1)
Sodium: 132 mmol/L — ABNORMAL LOW (ref 135–145)
Total Bilirubin: 0.5 mg/dL (ref 0.3–1.2)
Total Protein: 5.8 g/dL — ABNORMAL LOW (ref 6.5–8.1)

## 2016-06-10 LAB — GLUCOSE, CAPILLARY
GLUCOSE-CAPILLARY: 138 mg/dL — AB (ref 65–99)
GLUCOSE-CAPILLARY: 194 mg/dL — AB (ref 65–99)
GLUCOSE-CAPILLARY: 250 mg/dL — AB (ref 65–99)
Glucose-Capillary: 231 mg/dL — ABNORMAL HIGH (ref 65–99)

## 2016-06-10 LAB — CBC
HEMATOCRIT: 25.1 % — AB (ref 39.0–52.0)
Hemoglobin: 8 g/dL — ABNORMAL LOW (ref 13.0–17.0)
MCH: 26.9 pg (ref 26.0–34.0)
MCHC: 31.9 g/dL (ref 30.0–36.0)
MCV: 84.5 fL (ref 78.0–100.0)
PLATELETS: 247 10*3/uL (ref 150–400)
RBC: 2.97 MIL/uL — AB (ref 4.22–5.81)
RDW: 15.6 % — ABNORMAL HIGH (ref 11.5–15.5)
WBC: 12.1 10*3/uL — AB (ref 4.0–10.5)

## 2016-06-10 LAB — PREPARE RBC (CROSSMATCH)

## 2016-06-10 MED ORDER — SODIUM CHLORIDE 0.9 % IV SOLN
Freq: Once | INTRAVENOUS | Status: AC
  Administered 2016-06-10: 18:00:00 via INTRAVENOUS

## 2016-06-10 MED ORDER — FUROSEMIDE 10 MG/ML IJ SOLN
20.0000 mg | Freq: Once | INTRAMUSCULAR | Status: AC
  Administered 2016-06-10: 20 mg via INTRAVENOUS
  Filled 2016-06-10: qty 2

## 2016-06-10 NOTE — Plan of Care (Signed)
Problem: Education: Goal: Knowledge of [facility] Education information/materials will improve Outcome: Progressing Patient aware of plan of care.  Medication education provided to patient on all medications administered thus far this shift.  Patient stated understanding.

## 2016-06-10 NOTE — Progress Notes (Addendum)
Patient Profile: 76 yo male with PMH of CAD s/p CABGx5 with RCEA in 2010, severe LV systolic HF with EF of 123XX123 and moderate to severe AS, DM with neuropathy, CRI-2, PAD s/p L BKA 2013 2/2 chronic diabetic foot infection who presented with R foot pain 2/2 severe RLE arterial disease.  Day 1 s/p Peripheral Vascular Atherectomy (Right)  Subjective: Doing well. No complaints. He denies CP and dyspnea.   Objective: Vital signs in last 24 hours: Temp:  [97.9 F (36.6 C)-98.2 F (36.8 C)] 98.1 F (36.7 C) (06/22 0500) Pulse Rate:  [0-85] 80 (06/22 0500) Resp:  [0-68] 14 (06/21 2125) BP: (89-181)/(49-151) 101/59 mmHg (06/22 0500) SpO2:  [0 %-99 %] 93 % (06/22 0500) Weight:  [189 lb (85.73 kg)] 189 lb (85.73 kg) (06/22 0500) Last BM Date: 06/06/16 (last bowel movement per patient )  Intake/Output from previous day: 06/21 0701 - 06/22 0700 In: 1014.8 [P.O.:400; I.V.:350; IV Piggyback:264.8] Out: 875 [Urine:875] Intake/Output this shift:    Medications Current Facility-Administered Medications  Medication Dose Route Frequency Provider Last Rate Last Dose  . 0.9 %  sodium chloride infusion  500 mL Intravenous Once PRN Alvia Grove, PA-C      . acetaminophen (TYLENOL) tablet 325-650 mg  325-650 mg Oral Q4H PRN Alvia Grove, PA-C       Or  . acetaminophen (TYLENOL) suppository 325-650 mg  325-650 mg Rectal Q4H PRN Alvia Grove, PA-C      . aspirin EC tablet 81 mg  81 mg Oral Daily Samella Parr, NP   81 mg at 06/10/16 [id number]  . baclofen (LIORESAL) tablet 10 mg  10 mg Oral Daily Samella Parr, NP   10 mg at 06/10/16 0829  . carvedilol (COREG) tablet 12.5 mg  12.5 mg Oral BID WC Jerline Pain, MD   12.5 mg at 06/10/16 [id number]  . dextrose 5 % solution   Intravenous Continuous Thurnell Lose, MD 50 mL/hr at 06/09/16 0547    . docusate sodium (COLACE) capsule 100 mg  100 mg Oral Daily Alvia Grove, PA-C   100 mg at 06/10/16 0827  . enoxaparin (LOVENOX) injection 40 mg   40 mg Subcutaneous Q24H Samantha J Rhyne, PA-C   40 mg at 06/10/16 0830  . furosemide (LASIX) tablet 40 mg  40 mg Oral BID Josue Hector, MD   40 mg at 06/10/16 0829  . gabapentin (NEURONTIN) capsule 300 mg  300 mg Oral TID Samella Parr, NP   300 mg at 06/10/16 0827  . gi cocktail (Maalox,Lidocaine,Donnatal)  30 mL Oral QID PRN Samella Parr, NP      . guaiFENesin-dextromethorphan (ROBITUSSIN DM) 100-10 MG/5ML syrup 15 mL  15 mL Oral Q4H PRN Alvia Grove, PA-C      . hydrALAZINE (APRESOLINE) injection 5 mg  5 mg Intravenous Q20 Min PRN Alvia Grove, PA-C      . insulin aspart (novoLOG) injection 0-5 Units  0-5 Units Subcutaneous QHS Samella Parr, NP   2 Units at 06/09/16 2202  . insulin aspart (novoLOG) injection 0-9 Units  0-9 Units Subcutaneous TID WC Samella Parr, NP   1 Units at 06/10/16 0827  . insulin aspart (novoLOG) injection 3 Units  3 Units Subcutaneous TID WC Thurnell Lose, MD   3 Units at 06/10/16 [phone]  . insulin NPH Human (HUMULIN N,NOVOLIN N) injection 20 Units  20 Units Subcutaneous BID AC & HS Prashant Jennette Kettle,  MD   20 Units at 06/10/16 0825  . labetalol (NORMODYNE,TRANDATE) injection 10 mg  10 mg Intravenous Q10 min PRN Alvia Grove, PA-C      . magnesium sulfate IVPB 2 g 50 mL  2 g Intravenous Daily PRN Alvia Grove, PA-C      . metoprolol (LOPRESSOR) injection 2-5 mg  2-5 mg Intravenous Q2H PRN Alvia Grove, PA-C      . morphine 2 MG/ML injection 2 mg  2 mg Intravenous Q2H PRN Samella Parr, NP   2 mg at 06/07/16 1129  . ondansetron (ZOFRAN) injection 4 mg  4 mg Intravenous Q6H PRN Alvia Grove, PA-C      . pantoprazole (PROTONIX) EC tablet 40 mg  40 mg Oral Daily Alvia Grove, PA-C   40 mg at 06/10/16 [id number]  . phenol (CHLORASEPTIC) mouth spray 1 spray  1 spray Mouth/Throat PRN Alvia Grove, PA-C      . piperacillin-tazobactam (ZOSYN) IVPB 3.375 g  3.375 g Intravenous Q8H Wynell Balloon, RPH 12.5 mL/hr at 06/10/16 0549 3.375 g at  06/10/16 0549  . potassium chloride SA (K-DUR,KLOR-CON) CR tablet 20 mEq  20 mEq Oral Daily Samella Parr, NP   20 mEq at 06/10/16 0828  . potassium chloride SA (K-DUR,KLOR-CON) CR tablet 20-40 mEq  20-40 mEq Oral Daily PRN Alvia Grove, PA-C      . traMADol (ULTRAM) tablet 50 mg  50 mg Oral Q6H PRN Samella Parr, NP   50 mg at 06/09/16 1710  . vancomycin (VANCOCIN) IVPB 1000 mg/200 mL premix  1,000 mg Intravenous Q12H Wynell Balloon, RPH 200 mL/hr at 06/10/16 0359 1,000 mg at 06/10/16 0359  . zolpidem (AMBIEN) tablet 5 mg  5 mg Oral QHS PRN Jeryl [place], NP   5 mg at 06/09/16 2204    PE: General appearance: alert, cooperative and no distress Neck: no JVD and + radiation of AS murmur bilaterally Lungs: clear to auscultation bilaterally Heart: regular rate and rhythm and 4/6 systolic murmur throughout the precordium, loudest at the RUSB Extremities: s/p left BKA, grangrenous right LEE Pulses: 2+ and symmetric Skin: warm and dry Neurologic: Grossly normal  Lab Results:   Recent Labs  06/08/16 1549 06/09/16 0430 06/10/16 0410  WBC 12.4* 12.4* 12.1*  HGB 9.6* 8.9* 8.0*  HCT 30.4* 27.8* 25.1*  PLT 256 237 247   BMET  Recent Labs  06/08/16 0401 06/09/16 0430 06/10/16 0410  NA 134* 137 132*  K 4.0 3.6 3.5  CL 100* 101 99*  CO2 22 26 25   GLUCOSE 280* 151* 157*  BUN 15 20 20   CREATININE 1.42* 1.79* 1.70*  CALCIUM 8.7* 8.7* 8.1*   PT/INR  Recent Labs  06/09/16 0430  LABPROT 18.7*  INR 1.55*    Assessment/Plan  Principal Problem:   Ischemic ulcer of right foot ([place]) Active Problems:   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   Hyperlipidemia   Hx of CABG x 5 2010   Carotid artery disease-RCEA 2010   Spinal stenosis of lumbar region   BPH (benign prostatic hypertrophy)   History of CVA (cerebrovascular accident)   Moderate to severe aortic stenosis   S/P BKA (below knee amputation) unilateral ([facility])   HTN (hypertension)   PVD  (peripheral vascular disease) ([facility])   Elevated troponin   Pulmonary hypertension ([place])   Chronic systolic congestive heart failure, NYHA class 1 ([place])   Tachycardia   ICM-25-30% March  2017 echo   Ischemic ulcer of toe of right foot ([place])   CAD in native artery   Aortic stenosis, severe   Coronary artery disease involving coronary bypass graft of native heart   Dilated aortic root ([place])   1. Severe PVD: ischemic ulcer on right foot. Day 1 s/p revascularization with right Peripheral Vascular Atherectomy per Dr. Trula Slade. Per Vascular, he will likely need definitive surgical treatment to his foot while he has markedly improved flow for hopes for healing. Vascular recommends transmetatarsal amputation.  2. Severe/ Critical AS: seen by Dr. Prescott Gum. It is felt that he would not survive redo-sternotomy open AVR - he appears to be a potential candidate for TAVR after his active infection- gangrene is resolved. He denies CP, dyspnea, syncope/ near syncope.   3. Chronic Systolic HF: EF 123XX123. On Coreg and Lasix. No ACE/ARB given renal insufficieny. I/Os are in the positive 5.6 L but he does not appear to have any excess peripheral edema. Normal breathing. Monitor volume status closely. Continue lasix.   4. CAD s/p CABG x 5 in 2010: denies any anginal symptoms.     LOS: 5 days    Brittainy M. Ladoris Gene 06/10/2016 8:37 AM   Personally seen and examined. Agree with above. Creat mildly improved today EF 25% with severe AS and PVD Afebrile OK to receive 1 unit of PRBC (lasix 20IV following). Discussed with Dr. Mee Hives, MD

## 2016-06-10 NOTE — Plan of Care (Signed)
Problem: Education: Goal: Understanding of cardiac disease, CV risk reduction, and recovery process will improve Outcome: Progressing Verbalized understanding

## 2016-06-10 NOTE — Progress Notes (Addendum)
Subjective: Interval History: none.. Comfortable this morning. Wife is present in the room with him. Continues to have some mild shortness of breath but this is no change since yesterday.  Objective: Vital signs in last 24 hours: Temp:  [97.9 F (36.6 C)-98.2 F (36.8 C)] 98.1 F (36.7 C) (06/22 0500) Pulse Rate:  [0-85] 80 (06/22 0500) Resp:  [0-68] 14 (06/21 2125) BP: (89-181)/(49-151) 101/59 mmHg (06/22 0500) SpO2:  [0 %-99 %] 93 % (06/22 0500) Weight:  [189 lb (85.73 kg)] 189 lb (85.73 kg) (06/22 0500)  Intake/Output from previous day: 06/21 0701 - 06/22 0700 In: 1014.8 [P.O.:400; I.V.:350; IV Piggyback:264.8] Out: 875 [Urine:875] Intake/Output this shift:    Easily palpable right popliteal pulse and biphasic dorsalis pedis and peroneal signal. No change in right foot. Dry gangrene of his fifth toe with some extension onto the metatarsal head. Dry gangrene of the entire metatarsal head laterally on the great toe.  Lab Results:  Recent Labs  06/09/16 0430 06/10/16 0410  WBC 12.4* 12.1*  HGB 8.9* 8.0*  HCT 27.8* 25.1*  PLT 237 247   BMET  Recent Labs  06/09/16 0430 06/10/16 0410  NA 137 132*  K 3.6 3.5  CL 101 99*  CO2 26 25  GLUCOSE 151* 157*  BUN 20 20  CREATININE 1.79* 1.70*  CALCIUM 8.7* 8.1*    Studies/Results: Dg Chest 2 View  06/05/2016  CLINICAL DATA:  Shortness breath EXAM: CHEST  2 VIEW COMPARISON:  06/03/2016 chest radiograph. FINDINGS: Sternotomy wires appear aligned and intact. CABG clips overlie the mediastinum. Partially visualized surgical hardware from ACDF overlying the lower cervical spine. Stable cardiomediastinal silhouette with mild cardiomegaly. No pneumothorax. Stable trace left pleural effusion. No right pleural effusion. No pulmonary edema. No acute consolidative airspace disease. Stable mild left basilar atelectasis. IMPRESSION: 1. Stable mild cardiomegaly without pulmonary edema. 2. Stable trace left pleural effusion and mild left  basilar atelectasis. Electronically Signed   By: Ilona Sorrel M.D.   On: 06/05/2016 12:26   Dg Chest 2 View  06/03/2016  CLINICAL DATA:  Chest pain EXAM: CHEST  2 VIEW COMPARISON:  March 03, 2016 FINDINGS: There is mild scarring in the left base. Lungs elsewhere are clear. Heart is upper normal in size with pulmonary vascularity within normal limits. Patient is status post coronary artery bypass grafting. No adenopathy. There is postoperative change in the lower cervical spine region. IMPRESSION: Scarring left base. No edema or consolidation. Stable cardiac silhouette. Electronically Signed   By: Lowella Grip III M.D.   On: 06/03/2016 13:19   Mr Foot Right W Wo Contrast  06/02/2016  CLINICAL DATA:  Nonhealing wounds at the distal aspect of the right first metatarsal and between the fourth and fifth toes for 4-5 weeks. Question abscess or osteomyelitis. EXAM: MRI OF THE RIGHT FOREFOOT WITHOUT AND WITH CONTRAST TECHNIQUE: Multiplanar, multisequence MR imaging was performed both before and after administration of intravenous contrast. CONTRAST:  18 ml MULTIHANCE GADOBENATE DIMEGLUMINE 529 MG/ML IV SOLN COMPARISON:  Plain films of the right foot 05/24/2016. FINDINGS: There may be a skin wound along the head of the first metatarsal versus artifactual loss of visualization of cutaneous tissues. Artifact is favored. No abscess is identified. No bone marrow signal abnormality to suggest osteomyelitis is seen. Mild first MTP degenerative change is seen with some associated marrow edema in the medial sesamoid. Intrinsic musculature the foot demonstrates some fatty replacement with associated mildly increased T2 signal. IMPRESSION: Negative for abscess or osteomyelitis. Mild first MTP  osteoarthritis. Electronically Signed   By: Inge Rise M.D.   On: 06/02/2016 16:03   Dg Foot Complete Right  05/24/2016  CLINICAL DATA:  Nonhealing wound in the first toe EXAM: RIGHT FOOT COMPLETE - 3+ VIEW COMPARISON:  None.  FINDINGS: Mild degenerative changes of the first MTP joint are noted. No acute fracture or dislocation is seen. Mild degenerative changes are noted in the tarsal bones as well. No gross soft tissue abnormality is seen. No bony erosion to suggest osteomyelitis is noted. IMPRESSION: Degenerative change without acute bony abnormality. Electronically Signed   By: Inez Catalina M.D.   On: 05/24/2016 21:18   Anti-infectives: Anti-infectives    Start     Dose/Rate Route Frequency Ordered Stop   06/03/16 1400  piperacillin-tazobactam (ZOSYN) IVPB 3.375 g     3.375 g 12.5 mL/hr over 240 Minutes Intravenous Every 8 hours 06/03/16 1352     06/03/16 1400  vancomycin (VANCOCIN) IVPB 1000 mg/200 mL premix     1,000 mg 200 mL/hr over 60 Minutes Intravenous Every 12 hours 06/03/16 1352        Assessment/Plan: s/p Procedure(s) with comments: Abdominal Aortogram (N/A) Peripheral Vascular Atherectomy (Right) - Superficial femoral, popliteal, peroneal Creatinine stable at 1.7 following yesterday's procedure. Excellent result now with palpable popliteal pulse and much better flow to his foot. Discussed options with the patient and his wife this morning. Feel that he needs definitive surgical treatment to his foot while he has markedly improved flow for hopes for healing. Recommend transmetatarsal amputation. Does have involvement of his first and fifth metatarsal head. Feel he would have a much harder time healing a ray amputation of his first and fifth toe with minimal improvement as far as walking. Plan surgery for tomorrow. Hopefully can do this with a block so that he does not require general anesthesia with his severe aortic stenosis and low ejection fraction. Patient has many questions regarding ongoing treatment regarding his heart disease. Explained would have to take care of the infectious issue regarding his foot before any possible cardiac intervention could be entertained. Patient is wife understand to  proceed with surgery tomorrow. Also explained understand that this has risk of nonhealing and would result in below-knee amputation.   Anemic with a hemoglobin of 8 and hematocrit of 25. Will defer need for transfusion to primary team in light of his congestive heart failure. Should not lose a great deal of blood with transmetatarsal amputation tomorrow.   LOS: 5 days   Jordanne Elsbury 06/10/2016, 8:30 AM
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
## 2016-06-10 NOTE — Plan of Care (Signed)
Problem: Activity: Goal: Risk for activity intolerance will decrease Outcome: Progressing Tolerates OOB to chair with moderate assist

## 2016-06-10 NOTE — Progress Notes (Signed)
[facility]                                                                              Patient Demographics  Anthony Holder, is a 75 y.o. male, DOB - 1941/10/17, [id number]  Admit date - 06/03/2016   Admitting Physician Albertine Patricia, MD  Outpatient Primary MD for the patient is [name], MD  Outpatient specialists:   LOS -   days   Chief Complaint  Patient presents with  . Tachycardia  . Toe Pain      Brief summary   Patient is a 75 year old male with severe PVD,with prior left BKA and known ischemic ulcers right foot, chronic systolic CHF, AS with PAH, hypertension, history of CAD/ CABG 5 in 2010, DM, BPH, dyslipidemia and history of CVA. Patient recently underwent aortogram with right lower extremity runoff and may was found to have significant disease that was not amenable to intervention or surgery and medical therapy was opted for by the vascular surgeon. The past 2 days he noticed increased darkening of the pinky toe of the right foot with pain. He also noticed palpitations. He underwent an outpatient MRI on 6/14 that showed negative findings regarding abscess or osteomyelitis. During further workup patient underwent left heart cath showing multivessel disease and severe aortic stenosis along with severe systolic dysfunction with EF 25%, cardiology following, he also had evidence of right foot fifth toe dry gangrene with surrounding cellulitis and severe PAD. Vascular surgery following. Pt had right leg angioplasty on 06/09/2016, cardiology, vascular surgery following. Cardiology is consulted in cardiothoracic surgery for multivessel disease and severe AS.  Cardiothoracic surgery following for possible TAVR.    Assessment & Plan   Elevated troponin/Hx of CABG x 5 2010/abnormal EKG, cardiomyopathy, palpitations - Patient without chest pain, troponin trended up to 1.08, new ST segment downsloping and T-wave inversion on EKG. Cardiology  consulted, per Dr. Percival Spanish, needs a right and left cardiac cath, Cardiology following. Currently chest pain-free, on aspirin, beta blocker for secondary prevention, has statin allergy. He underwent catheterization on 06/07/2016 showing multivessel disease, EF of 25% along with severe AS. Cardiology following and consulting cardiothoracic surgery for input.  Aortic stenosis, moderate/Pulmonary hypertension - as above.   Mild acute on Chronic systolic congestive heart failure, NYHA class 1 EF 25%. With moderate to severe AS. Currently symptom-free, blood pressure is borderline, on beta blocker and Lasix, not on ACE inhibitor or ARB due to soft blood pressures and moderate to severe AS. On 06/08/2016 he was mildly short of breath as he received IV fluids for cardiac catheterization prep, extra Lasix today and monitor.  PVD (peripheral vascular disease)/S/P BKA (below knee amputation) unilateral/ Ischemic ulcer of right foot with dry gangrene of the fifth toe and then around the first metatarsal head with some surrounding cellulitis  - blood cultures NGTD, no other source of infection, UA unremarkable, chest x-ray unimpressive, Continue IV vancomycin and Zosyn. Vascular surgery following, discussed his case with [name] on 06/08/2016, vascular surgery will attempt right leg angioplasty on 06/09/2016. For now no plans of amputation.  Pt had endovascular procedures on 6/21 below:  #1 successful  atherectomy and angioplasty of a occluded right peroneal artery using a CSI 1.25 device and a coyote 3 mm balloon. Residual stenosis was less than 10% #2 successful atherectomy and drug coated balloon angioplasty of the right superficial femoral and popliteal artery treating 90% stenosis. Residual stenosis was less than 10%. #3 2 non-flow-limiting dissections were seen in the popliteal superficial femoral artery.  Pt is scheduled for transmetatarsal amputation tomorrow with vascular  surgery. - see notes.   Plan to continue zosyn and vancomycin for now.  Blood cultures no growth to date.    HTN (hypertension) -Blood pressure currently well-controlled on Tenormin  Hyperlipidemia -Continue omega-3 fatty acids  BPH (benign prostatic hypertrophy)-Monitor for acute urinary retention  History of CVA (cerebrovascular accident)-Continue aspirin, Patient with documented statin allergy  Anemia - likely AOCD -  Hemoglobin dropping, will give 1 unit PRBC slowly today with lasix given post transfusion, follow up hemogobin post transfusion.   FOBT, anemia panel inconclusive but most consistent with iron deficiency.  Will need to get him on iron supplement daily after transfusion.   CKD 3 - close to baseline creat of 1.2.  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     Lab Results  Component Value Date   HGBA1C 8.0* 06/03/2016   CBG (last 3)   Recent Labs  06/09/16 1634 06/09/16 2122 06/10/16 0742  GLUCAP 159* 233* 138*    Code Status: Full CODE STATUS DVT Prophylaxis:  Lovenox  Family Communication: Discussed with the patient and wife Daily bedside  Disposition Plan: Stay inpatient  Procedures:   TTE - Left ventricle: The cavity size was normal. Wall thickness was normal. Systolic function was severely reduced. The estimated ejection fraction was in the range of 25% to 30%. Akinesis of the inferior myocardium. - Aortic valve: There was moderate to severe stenosis. Valve area (VTI): 0.66 cm^2. Valve area (Vmax): 0.62 cm^2. Valve area (Vmean): 0.58 cm^2. - Left atrium: The atrium was mildly dilated. - Pulmonary arteries: Systolic pressure was moderately increased. PA peak pressure: 54 mm Hg (S).   MR R foot - no osteomyelitis or abscess.  L heart Cath 06-07-16 - multivessel CAD with severe aortic stenosis  Consultants:    Cardiology Vascular surgery Cardiology  Has consulted Dr. Lucianne Lei tright cardiothoracic surgery  Antibiotics :   Anti-infectives    Start     Dose/Rate Route Frequency Ordered Stop   06/03/16 1400  piperacillin-tazobactam (ZOSYN) IVPB 3.375 g     3.375 g 12.5 mL/hr over 240 Minutes Intravenous Every 8 hours 06/03/16 1352     06/03/16 1400  vancomycin (VANCOCIN) IVPB 1000 mg/200 mL premix     1,000 mg 200 mL/hr over 60 Minutes Intravenous Every 12 hours 06/03/16 1352       Medications  Scheduled Meds: . aspirin EC  81 mg Oral Daily  . baclofen  10 mg Oral Daily  . carvedilol  12.5 mg Oral BID WC  . docusate sodium  100 mg Oral Daily  . enoxaparin (LOVENOX) injection  40 mg Subcutaneous Q24H  . furosemide  40 mg Oral BID  . gabapentin  300 mg Oral TID  . insulin aspart  0-5 Units Subcutaneous QHS  . insulin aspart  0-9 Units Subcutaneous TID WC  . insulin aspart  3 Units Subcutaneous TID WC  . insulin NPH Human  20 Units Subcutaneous BID AC & HS  . pantoprazole  40 mg Oral Daily  . piperacillin-tazobactam (ZOSYN)  IV  3.375 g Intravenous Q8H  . potassium chloride SA  20 mEq Oral Daily  . vancomycin  1,000 mg Intravenous Q12H   Continuous Infusions: . dextrose 50 mL/hr at 06/09/16 0547   PRN Meds:.sodium chloride, acetaminophen **OR** acetaminophen, gi cocktail, guaiFENesin-dextromethorphan, hydrALAZINE, labetalol, magnesium sulfate 1 - 4 g bolus IVPB, metoprolol, morphine injection, ondansetron, phenol, potassium chloride, traMADol, zolpidem   Subjective:   Anthony Holder  Pt is not complaining about anything today.  He appears comfortable.   Objective:   Filed Vitals:   06/09/16 1600 06/09/16 1659 06/09/16 2125 06/10/16 0500  BP: 89/50 107/57 105/59 101/59  Pulse: 74 75 77 80  Temp:  98.2 F (36.8 C) 97.9 F (36.6 C) 98.1 F (36.7 C)  TempSrc:  Oral Oral Oral  Resp: 24 16 14    Height:      Weight:    189 lb (85.73 kg)  SpO2: 94% 95% 96% 93%    Intake/Output Summary (Last 24 hours) at 06/10/16  1004 Last data filed at 06/10/16 0700  Gross per 24 hour  Intake 1014.79 ml  Output    275 ml  Net 739.79 ml    Wt Readings from Last 3 Encounters:  06/10/16 189 lb (85.73 kg)  05/10/16 190 lb (86.183 kg)  05/06/16 196 lb (88.905 kg)   Exam  General: Alert and oriented x 3, NAD  HEENT:    Neck: Supple, no JVD, no masses  Cardiovascular: S1 S2 auscultated, no rubs, Loud aortic systolic murmur, Regular rate and rhythm.  Respiratory: Clear to auscultation bilaterally, no wheezing, rales or rhonchi  Gastrointestinal: Soft, nontender, nondistended, + bowel sounds  Ext: no cyanosis clubbing, left BKA  Neuro: no new deficit  Skin: Right 4,5 and great toe dry gangrene with eschar  Psych: Normal affect and demeanor, alert and oriented x3    Data Reviewed:  I have personally reviewed following labs and imaging studies  Micro Results Recent Results (from the past 240 hour(s))  Urine culture     Status: None   Collection Time: 06/06/16 10:37 AM  Result Value Ref Range Status   Specimen Description URINE, RANDOM  Final   Special Requests NONE  Final   Culture NO GROWTH  Final   Report Status 06/07/2016 FINAL  Final  Culture, blood (Routine X 2) w Reflex to ID Panel     Status: None (Preliminary result)   Collection Time: 06/06/16  5:09 PM  Result Value Ref Range Status   Specimen Description BLOOD RIGHT HAND  Final   Special Requests BOTTLES DRAWN AEROBIC AND ANAEROBIC 5CC  Final   Culture NO GROWTH 3 DAYS  Final   Report Status PENDING  Incomplete  Culture, blood (Routine X 2) w Reflex to ID Panel     Status: None (Preliminary result)   Collection Time: 06/06/16  5:09 PM  Result Value Ref Range Status   Specimen Description BLOOD RIGHT HAND  Final   Special Requests IN PEDIATRIC BOTTLE 5CC  Final   Culture NO GROWTH 3 DAYS  Final   Report Status PENDING  Incomplete    Radiology Reports Dg Chest 2 View  06/05/2016  CLINICAL DATA:  Shortness breath EXAM: CHEST  2  VIEW COMPARISON:  06/03/2016 chest radiograph. FINDINGS: Sternotomy wires appear aligned and intact. CABG clips overlie the mediastinum. Partially visualized surgical hardware from ACDF overlying the lower cervical spine. Stable cardiomediastinal silhouette with mild cardiomegaly. No pneumothorax. Stable trace  left pleural effusion. No right pleural effusion. No pulmonary edema. No acute consolidative airspace disease. Stable mild left basilar atelectasis. IMPRESSION: 1. Stable mild cardiomegaly without pulmonary edema. 2. Stable trace left pleural effusion and mild left basilar atelectasis. Electronically Signed   By: Ilona Sorrel M.D.   On: 06/05/2016 12:26   Dg Chest 2 View  06/03/2016  CLINICAL DATA:  Chest pain EXAM: CHEST  2 VIEW COMPARISON:  March 03, 2016 FINDINGS: There is mild scarring in the left base. Lungs elsewhere are clear. Heart is upper normal in size with pulmonary vascularity within normal limits. Patient is status post coronary artery bypass grafting. No adenopathy. There is postoperative change in the lower cervical spine region. IMPRESSION: Scarring left base. No edema or consolidation. Stable cardiac silhouette. Electronically Signed   By: Lowella Grip III M.D.   On: 06/03/2016 13:19   Mr Foot Right W Wo Contrast  06/02/2016  CLINICAL DATA:  Nonhealing wounds at the distal aspect of the right first metatarsal and between the fourth and fifth toes for 4-5 weeks. Question abscess or osteomyelitis. EXAM: MRI OF THE RIGHT FOREFOOT WITHOUT AND WITH CONTRAST TECHNIQUE: Multiplanar, multisequence MR imaging was performed both before and after administration of intravenous contrast. CONTRAST:  18 ml MULTIHANCE GADOBENATE DIMEGLUMINE 529 MG/ML IV SOLN COMPARISON:  Plain films of the right foot 05/24/2016. FINDINGS: There may be a skin wound along the head of the first metatarsal versus artifactual loss of visualization of cutaneous tissues. Artifact is favored. No abscess is identified. No  bone marrow signal abnormality to suggest osteomyelitis is seen. Mild first MTP degenerative change is seen with some associated marrow edema in the medial sesamoid. Intrinsic musculature the foot demonstrates some fatty replacement with associated mildly increased T2 signal. IMPRESSION: Negative for abscess or osteomyelitis. Mild first MTP osteoarthritis. Electronically Signed   By: Inge Rise M.D.   On: 06/02/2016 16:03   Dg Foot Complete Right  05/24/2016  CLINICAL DATA:  Nonhealing wound in the first toe EXAM: RIGHT FOOT COMPLETE - 3+ VIEW COMPARISON:  None. FINDINGS: Mild degenerative changes of the first MTP joint are noted. No acute fracture or dislocation is seen. Mild degenerative changes are noted in the tarsal bones as well. No gross soft tissue abnormality is seen. No bony erosion to suggest osteomyelitis is noted. IMPRESSION: Degenerative change without acute bony abnormality. Electronically Signed   By: Inez Catalina M.D.   On: 05/24/2016 21:18   Lab Data:  CBC:  Recent Labs Lab 06/07/16 0451 06/08/16 0401 06/08/16 1549 06/09/16 0430 06/10/16 0410  WBC 9.4 13.0* 12.4* 12.4* 12.1*  HGB 9.3* 10.6* 9.6* 8.9* 8.0*  HCT 28.6* 33.5* 30.4* 27.8* 25.1*  MCV 83.6 85.2 84.7 83.0 84.5  PLT 244 302 256 237 [id number]   Basic Metabolic Panel:  Recent Labs Lab 06/06/16 2301 06/07/16 0451 06/08/16 0401 06/09/16 0430 06/10/16 0410  NA 129* 131* 134* 137 132*  K 3.5 3.6 4.0 3.6 3.5  CL 96* 98* 100* 101 99*  CO2 24 24 22 26 25   GLUCOSE 200* 159* 280* 151* 157*  BUN 17 15 15 20 20   CREATININE 1.24 1.25* 1.42* 1.79* 1.70*  CALCIUM 8.2* 8.2* 8.7* 8.7* 8.1*   GFR: Estimated Creatinine Clearance: 39.4 mL/min (by C-G formula based on Cr of 1.7). Liver Function Tests:  Recent Labs Lab 06/10/16 0410  AST 30  ALT 21  ALKPHOS 51  BILITOT 0.5  PROT 5.8*  ALBUMIN 2.0*   No results for input(s): LIPASE, AMYLASE  in the last 168 hours. No results for input(s): AMMONIA in the last 168  hours. Coagulation Profile:  Recent Labs Lab 06/04/16 0559 06/05/16 0958 06/09/16 0430  INR 1.29 1.36 1.55*   Cardiac Enzymes:  Recent Labs Lab 06/03/16 1825 06/03/16 2333 06/04/16 0559  TROPONINI 0.95* 0.94* 1.08*   BNP (last 3 results) No results for input(s): PROBNP in the last 8760 hours. HbA1C: No results for input(s): HGBA1C in the last 72 hours. CBG:  Recent Labs Lab 06/09/16 0735 06/09/16 1406 06/09/16 1634 06/09/16 2122 06/10/16 0742  GLUCAP 138* 126* 159* 233* 138*   Lipid Profile: No results for input(s): CHOL, HDL, LDLCALC, TRIG, CHOLHDL, LDLDIRECT in the last 72 hours. Thyroid Function Tests: No results for input(s): TSH, T4TOTAL, FREET4, T3FREE, THYROIDAB in the last 72 hours. Anemia Panel: No results for input(s): VITAMINB12, FOLATE, FERRITIN, TIBC, IRON, RETICCTPCT in the last 72 hours. Urine analysis:    Component Value Date/Time   COLORURINE YELLOW 06/06/2016 1037   APPEARANCEUR HAZY* 06/06/2016 1037   LABSPEC 1.022 06/06/2016 1037   PHURINE 5.5 06/06/2016 1037   GLUCOSEU 100* 06/06/2016 1037   HGBUR SMALL* 06/06/2016 1037   BILIRUBINUR NEGATIVE 06/06/2016 1037   KETONESUR NEGATIVE 06/06/2016 1037   PROTEINUR 100* 06/06/2016 1037   UROBILINOGEN 1.0 06/13/2015 0957   NITRITE NEGATIVE 06/06/2016 1037   LEUKOCYTESUR NEGATIVE 06/06/2016 1037   Signature  Clanford Johnson M.D on 06/10/2016 at 10:04 AM  Between 7am to 7pm - Pager - [phone], After 7pm go to [email or web address] - password [facility]  [facility]  - Office  [phone]

## 2016-06-10 NOTE — Care Management Important Message (Signed)
Important Message  Patient Details  Name: Anthony Holder MRN: [id number] Date of Birth: 07-17-1941   Medicare Important Message Given:  Yes    Bethena Roys, RN 06/10/2016, 11:59 AM

## 2016-06-11 ENCOUNTER — Inpatient Hospital Stay (HOSPITAL_COMMUNITY): Payer: Medicare Other | Admitting: Anesthesiology

## 2016-06-11 ENCOUNTER — Encounter (HOSPITAL_COMMUNITY)
Admission: EM | Disposition: A | Discharge status: Skilled Nursing Facility | Payer: Self-pay | Source: Home / Self Care | Attending: Family Medicine

## 2016-06-11 DIAGNOSIS — N183 Chronic kidney disease, stage 3 (moderate): Secondary | ICD-10-CM

## 2016-06-11 DIAGNOSIS — E1122 Type 2 diabetes mellitus with diabetic chronic kidney disease: Secondary | ICD-10-CM

## 2016-06-11 HISTORY — PX: TRANSMETATARSAL AMPUTATION: SHX6197

## 2016-06-11 LAB — CBC
HCT: 33.2 % — ABNORMAL LOW (ref 39.0–52.0)
Hemoglobin: 10.9 g/dL — ABNORMAL LOW (ref 13.0–17.0)
MCH: 27 pg (ref 26.0–34.0)
MCHC: 32.8 g/dL (ref 30.0–36.0)
MCV: 82.2 fL (ref 78.0–100.0)
Platelets: 263 10*3/uL (ref 150–400)
RBC: 4.04 MIL/uL — ABNORMAL LOW (ref 4.22–5.81)
RDW: 15.3 % (ref 11.5–15.5)
WBC: 11.9 10*3/uL — ABNORMAL HIGH (ref 4.0–10.5)

## 2016-06-11 LAB — CULTURE, BLOOD (ROUTINE X 2)
CULTURE: NO GROWTH
Culture: NO GROWTH

## 2016-06-11 LAB — SURGICAL PCR SCREEN
MRSA, PCR: NEGATIVE
Staphylococcus aureus: NEGATIVE

## 2016-06-11 LAB — BASIC METABOLIC PANEL
Anion gap: 7 (ref 5–15)
BUN: 17 mg/dL (ref 6–20)
CALCIUM: 8.4 mg/dL — AB (ref 8.9–10.3)
CHLORIDE: 103 mmol/L (ref 101–111)
CO2: 25 mmol/L (ref 22–32)
CREATININE: 1.69 mg/dL — AB (ref 0.61–1.24)
GFR calc Af Amer: 44 mL/min — ABNORMAL LOW (ref >60)
GFR calc non Af Amer: 38 mL/min — ABNORMAL LOW (ref >60)
GLUCOSE: 135 mg/dL — AB (ref 65–99)
Potassium: 3.5 mmol/L (ref 3.5–5.1)
Sodium: 135 mmol/L (ref 135–145)

## 2016-06-11 LAB — GLUCOSE, CAPILLARY
GLUCOSE-CAPILLARY: 113 mg/dL — AB (ref 65–99)
GLUCOSE-CAPILLARY: 120 mg/dL — AB (ref 65–99)
GLUCOSE-CAPILLARY: 235 mg/dL — AB (ref 65–99)
Glucose-Capillary: 104 mg/dL — ABNORMAL HIGH (ref 65–99)
Glucose-Capillary: 129 mg/dL — ABNORMAL HIGH (ref 65–99)

## 2016-06-11 SURGERY — AMPUTATION, FOOT, TRANSMETATARSAL
Anesthesia: Regional | Site: Foot | Laterality: Right

## 2016-06-11 MED ORDER — FENTANYL CITRATE (PF) 250 MCG/5ML IJ SOLN
INTRAMUSCULAR | Status: AC
  Filled 2016-06-11: qty 5

## 2016-06-11 MED ORDER — MIDAZOLAM HCL 2 MG/2ML IJ SOLN
INTRAMUSCULAR | Status: AC
  Filled 2016-06-11: qty 2

## 2016-06-11 MED ORDER — DEXTROSE 5 % IV SOLN: INTRAVENOUS | Status: DC

## 2016-06-11 MED ORDER — FENTANYL CITRATE (PF) 100 MCG/2ML IJ SOLN
25.0000 ug | Freq: Once | INTRAMUSCULAR | Status: AC
  Administered 2016-06-11: 25 ug via INTRAVENOUS

## 2016-06-11 MED ORDER — FENTANYL CITRATE (PF) 100 MCG/2ML IJ SOLN
INTRAMUSCULAR | Status: AC
  Filled 2016-06-11: qty 2

## 2016-06-11 MED ORDER — 0.9 % SODIUM CHLORIDE (POUR BTL) OPTIME
TOPICAL | Status: DC | PRN
  Administered 2016-06-11: 1000 mL

## 2016-06-11 MED ORDER — BACITRACIN ZINC 500 UNIT/GM EX OINT
TOPICAL_OINTMENT | CUTANEOUS | Status: AC
  Filled 2016-06-11: qty 28.35

## 2016-06-11 MED ORDER — FENTANYL CITRATE (PF) 100 MCG/2ML IJ SOLN: 25.0000 ug | INTRAMUSCULAR | Status: DC | PRN

## 2016-06-11 MED ORDER — LACTATED RINGERS IV SOLN
INTRAVENOUS | Status: DC
Start: 2016-06-11 — End: 2016-06-17
  Administered 2016-06-11: 11:00:00 via INTRAVENOUS

## 2016-06-11 MED ORDER — DEXTROSE 5 % IV SOLN
INTRAVENOUS | Status: DC
  Administered 2016-06-11: 10:00:00 via INTRAVENOUS

## 2016-06-11 MED ORDER — BUPIVACAINE-EPINEPHRINE (PF) 0.5% -1:200000 IJ SOLN
INTRAMUSCULAR | Status: DC | PRN
  Administered 2016-06-11: 15 mL

## 2016-06-11 MED ORDER — PROPOFOL 10 MG/ML IV BOLUS
INTRAVENOUS | Status: AC
  Filled 2016-06-11: qty 20

## 2016-06-11 MED ORDER — MORPHINE SULFATE (PF) 2 MG/ML IV SOLN: 2.0000 mg | INTRAVENOUS | Status: DC | PRN

## 2016-06-11 MED ORDER — BACITRACIN ZINC 500 UNIT/GM EX OINT
TOPICAL_OINTMENT | CUTANEOUS | Status: DC | PRN
  Administered 2016-06-11: 1 via TOPICAL

## 2016-06-11 MED ORDER — ONDANSETRON HCL 4 MG/2ML IJ SOLN
INTRAMUSCULAR | Status: AC
  Filled 2016-06-11: qty 2

## 2016-06-11 MED ORDER — FENTANYL CITRATE (PF) 250 MCG/5ML IJ SOLN
INTRAMUSCULAR | Status: DC | PRN
  Administered 2016-06-11: 50 ug via INTRAVENOUS

## 2016-06-11 MED ORDER — MEPIVACAINE HCL 1.5 % IJ SOLN
INTRAMUSCULAR | Status: DC | PRN
  Administered 2016-06-11: 15 mL via PERINEURAL

## 2016-06-11 MED ORDER — OXYCODONE-ACETAMINOPHEN 5-325 MG PO TABS
1.0000 | ORAL_TABLET | ORAL | Status: DC | PRN
  Administered 2016-06-12 (×2): 2 via ORAL
  Administered 2016-06-12: 1 via ORAL
  Administered 2016-06-13 – 2016-06-15 (×3): 2 via ORAL
  Filled 2016-06-11 (×5): qty 2
  Filled 2016-06-11: qty 1

## 2016-06-11 MED ORDER — PROMETHAZINE HCL 25 MG/ML IJ SOLN: 6.2500 mg | INTRAMUSCULAR | Status: DC | PRN

## 2016-06-11 MED ORDER — VANCOMYCIN HCL IN DEXTROSE 1-5 GM/200ML-% IV SOLN
1000.0000 mg | Freq: Two times a day (BID) | INTRAVENOUS | Status: DC
  Administered 2016-06-11 – 2016-06-14 (×8): 1000 mg via INTRAVENOUS
  Filled 2016-06-11 (×8): qty 200

## 2016-06-11 SURGICAL SUPPLY — 34 items
BANDAGE ELASTIC 4 VELCRO ST LF (GAUZE/BANDAGES/DRESSINGS) ×3 IMPLANT
BLADE LONG MED 31MMX9MM (MISCELLANEOUS) ×1
BLADE LONG MED 31X9 (MISCELLANEOUS) ×1 IMPLANT
BNDG GAUZE ELAST 4 BULKY (GAUZE/BANDAGES/DRESSINGS) ×3 IMPLANT
CANISTER SUCTION 2500CC (MISCELLANEOUS) ×3 IMPLANT
COVER SURGICAL LIGHT HANDLE (MISCELLANEOUS) ×3 IMPLANT
DRAPE EXTREMITY T 121X128X90 (DRAPE) ×3 IMPLANT
DRAPE PROXIMA HALF (DRAPES) ×2 IMPLANT
DRSG ADAPTIC 3X8 NADH LF (GAUZE/BANDAGES/DRESSINGS) ×2 IMPLANT
ELECT REM PT RETURN 9FT ADLT (ELECTROSURGICAL) ×3
ELECTRODE REM PT RTRN 9FT ADLT (ELECTROSURGICAL) ×1 IMPLANT
GAUZE SPONGE 4X4 12PLY STRL (GAUZE/BANDAGES/DRESSINGS) ×3 IMPLANT
GLOVE BIO SURGEON STRL SZ 6 (GLOVE) ×2 IMPLANT
GLOVE BIO SURGEON STRL SZ7.5 (GLOVE) ×3 IMPLANT
GLOVE BIOGEL PI IND STRL 7.0 (GLOVE) IMPLANT
GLOVE BIOGEL PI IND STRL 8 (GLOVE) ×1 IMPLANT
GLOVE BIOGEL PI INDICATOR 7.0 (GLOVE) ×4
GLOVE BIOGEL PI INDICATOR 8 (GLOVE) ×2
GLOVE SURG SS PI 6.5 STRL IVOR (GLOVE) ×2 IMPLANT
GOWN STRL REUS W/ TWL LRG LVL3 (GOWN DISPOSABLE) ×3 IMPLANT
GOWN STRL REUS W/TWL LRG LVL3 (GOWN DISPOSABLE) ×9
KIT BASIN OR (CUSTOM PROCEDURE TRAY) ×3 IMPLANT
KIT ROOM TURNOVER OR (KITS) ×3 IMPLANT
NS IRRIG 1000ML POUR BTL (IV SOLUTION) ×3 IMPLANT
PACK GENERAL/GYN (CUSTOM PROCEDURE TRAY) ×3 IMPLANT
PAD ARMBOARD 7.5X6 YLW CONV (MISCELLANEOUS) ×6 IMPLANT
SPONGE GAUZE 4X4 12PLY STER LF (GAUZE/BANDAGES/DRESSINGS) ×2 IMPLANT
SUT ETHILON 3 0 PS 1 (SUTURE) ×5 IMPLANT
SUT VIC AB 3-0 SH 27 (SUTURE) ×9
SUT VIC AB 3-0 SH 27X BRD (SUTURE) IMPLANT
SWAB COLLECTION DEVICE MRSA (MISCELLANEOUS) IMPLANT
TUBE ANAEROBIC SPECIMEN COL (MISCELLANEOUS) IMPLANT
UNDERPAD 30X30 INCONTINENT (UNDERPADS AND DIAPERS) ×3 IMPLANT
WATER STERILE IRR 1000ML POUR (IV SOLUTION) ×3 IMPLANT

## 2016-06-11 NOTE — Interval H&P Note (Signed)
History and Physical Interval Note:  06/11/2016 10:34 AM  Anthony Holder  has presented today for surgery, with the diagnosis of Gangrene right first and fifth toe metatarsal head I96  The various methods of treatment have been discussed with the patient and family. After consideration of risks, benefits and other options for treatment, the patient has consented to  Procedure(s): TRANSMETATARSAL AMPUTATION (Right) as a surgical intervention .  The patient's history has been reviewed, patient examined, no change in status, stable for surgery.  I have reviewed the patient's chart and labs.  Questions were answered to the patient's satisfaction.     [name]

## 2016-06-11 NOTE — Progress Notes (Signed)
Patient Profile: 75 yo male with PMH of CAD s/p CABGx5 with RCEA in 2010, severe LV systolic HF with EF of 123XX123 and moderate to severe AS, DM with neuropathy, CRI-2, PAD s/p L BKA 2013 2/2 chronic diabetic foot infection who presented with R foot pain 2/2 severe RLE arterial disease.  Day 2 s/p Peripheral Vascular Atherectomy (Right)  Subjective: Doing well. No complaints. He denies CP and dyspnea. Going for surgery today for transmetatarsal amputation of right LE.   Objective: Vital signs in last 24 hours: Temp:  [97.6 F (36.4 C)-100.2 F (37.9 C)] 100.2 F (37.9 C) (06/23 0501) Pulse Rate:  [70-87] 74 (06/23 0501) Resp:  [20-28] 25 (06/23 0501) BP: (102-119)/(46-72) 119/59 mmHg (06/23 0501) SpO2:  [92 %-100 %] 98 % (06/23 0501) Weight:  [187 lb 1.6 oz (84.868 kg)] 187 lb 1.6 oz (84.868 kg) (06/23 0500) Last BM Date: 06/08/16  Intake/Output from previous day: 06/22 0701 - 06/23 0700 In: 1008.5 [I.V.:65; Blood:670; IV Piggyback:273.5] Out: 550 [Urine:550] Intake/Output this shift:    Medications Current Facility-Administered Medications  Medication Dose Route Frequency Provider Last Rate Last Dose  . 0.9 %  sodium chloride infusion  500 mL Intravenous Once PRN Alvia Grove, PA-C      . acetaminophen (TYLENOL) tablet 325-650 mg  325-650 mg Oral Q4H PRN Alvia Grove, PA-C       Or  . acetaminophen (TYLENOL) suppository 325-650 mg  325-650 mg Rectal Q4H PRN Alvia Grove, PA-C      . aspirin EC tablet 81 mg  81 mg Oral Daily Samella Parr, NP   81 mg at 06/11/16 0836  . baclofen (LIORESAL) tablet 10 mg  10 mg Oral Daily Samella Parr, NP   10 mg at 06/11/16 0836  . carvedilol (COREG) tablet 12.5 mg  12.5 mg Oral BID WC Jerline Pain, MD   12.5 mg at 06/11/16 0837  . docusate sodium (COLACE) capsule 100 mg  100 mg Oral Daily Alvia Grove, PA-C   100 mg at 06/11/16 0837  . enoxaparin (LOVENOX) injection 40 mg  40 mg Subcutaneous Q24H Samantha J Rhyne,  PA-C   40 mg at 06/10/16 0830  . furosemide (LASIX) tablet 40 mg  40 mg Oral BID Josue Hector, MD   40 mg at 06/11/16 0836  . gabapentin (NEURONTIN) capsule 300 mg  300 mg Oral TID Samella Parr, NP   300 mg at 06/11/16 0836  . gi cocktail (Maalox,Lidocaine,Donnatal)  30 mL Oral QID PRN Samella Parr, NP      . guaiFENesin-dextromethorphan (ROBITUSSIN DM) 100-10 MG/5ML syrup 15 mL  15 mL Oral Q4H PRN Alvia Grove, PA-C      . hydrALAZINE (APRESOLINE) injection 5 mg  5 mg Intravenous Q20 Min PRN Alvia Grove, PA-C      . insulin aspart (novoLOG) injection 0-5 Units  0-5 Units Subcutaneous QHS Samella Parr, NP   2 Units at 06/10/16 2149  . insulin aspart (novoLOG) injection 0-9 Units  0-9 Units Subcutaneous TID WC Samella Parr, NP   3 Units at 06/10/16 1845  . insulin aspart (novoLOG) injection 3 Units  3 Units Subcutaneous TID WC Thurnell Lose, MD   3 Units at 06/10/16 1846  . insulin NPH Human (HUMULIN N,NOVOLIN N) injection 20 Units  20 Units Subcutaneous BID AC & HS Thurnell Lose, MD   20 Units at 06/10/16 2151  . labetalol (NORMODYNE,TRANDATE) injection  10 mg  10 mg Intravenous Q10 min PRN Alvia Grove, PA-C      . magnesium sulfate IVPB 2 g 50 mL  2 g Intravenous Daily PRN Alvia Grove, PA-C      . metoprolol (LOPRESSOR) injection 2-5 mg  2-5 mg Intravenous Q2H PRN Alvia Grove, PA-C      . morphine 2 MG/ML injection 2 mg  2 mg Intravenous Q2H PRN Samella Parr, NP   2 mg at 06/07/16 1129  . ondansetron (ZOFRAN) injection 4 mg  4 mg Intravenous Q6H PRN Alvia Grove, PA-C      . pantoprazole (PROTONIX) EC tablet 40 mg  40 mg Oral Daily Alvia Grove, PA-C   40 mg at 06/11/16 0836  . phenol (CHLORASEPTIC) mouth spray 1 spray  1 spray Mouth/Throat PRN Alvia Grove, PA-C      . piperacillin-tazobactam (ZOSYN) IVPB 3.375 g  3.375 g Intravenous Q8H Wynell Balloon, RPH 12.5 mL/hr at 06/11/16 0507 3.375 g at 06/11/16 0507  . potassium chloride SA  (K-DUR,KLOR-CON) CR tablet 20 mEq  20 mEq Oral Daily Samella Parr, NP   20 mEq at 06/11/16 0837  . potassium chloride SA (K-DUR,KLOR-CON) CR tablet 20-40 mEq  20-40 mEq Oral Daily PRN Alvia Grove, PA-C      . traMADol (ULTRAM) tablet 50 mg  50 mg Oral Q6H PRN Samella Parr, NP   50 mg at 06/09/16 1710  . vancomycin (VANCOCIN) IVPB 1000 mg/200 mL premix  1,000 mg Intravenous Q12H Crystal Trellis Moment, RPH      . zolpidem (AMBIEN) tablet 5 mg  5 mg Oral QHS PRN Jeryl Columbia, NP   5 mg at 06/09/16 2204    PE: General appearance: alert, cooperative and no distress Neck: no JVD and + radiation of AS murmur bilaterally Lungs: clear to auscultation bilaterally Heart: regular rate and rhythm and 4/6 systolic murmur throughout the precordium, loudest at the RUSB Extremities: s/p left BKA, grangrenous right LEE Pulses: 2+ and symmetric Skin: warm and dry Neurologic: Grossly normal  Lab Results:   Recent Labs  06/09/16 0430 06/10/16 0410 06/11/16 0425  WBC 12.4* 12.1* 11.9*  HGB 8.9* 8.0* 10.9*  HCT 27.8* 25.1* 33.2*  PLT 237 247 263   BMET  Recent Labs  06/09/16 0430 06/10/16 0410 06/11/16 0425  NA 137 132* 135  K 3.6 3.5 3.5  CL 101 99* 103  CO2 26 25 25   GLUCOSE 151* 157* 135*  BUN 20 20 17   CREATININE 1.79* 1.70* 1.69*  CALCIUM 8.7* 8.1* 8.4*   PT/INR  Recent Labs  06/09/16 0430  LABPROT 18.7*  INR 1.55*    Assessment/Plan  Principal Problem:   Ischemic ulcer of right foot (HCC) Active Problems:   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   Hyperlipidemia   Hx of CABG x 5 2010   Carotid artery disease-RCEA 2010   Spinal stenosis of lumbar region   BPH (benign prostatic hypertrophy)   History of CVA (cerebrovascular accident)   Moderate to severe aortic stenosis   S/P BKA (below knee amputation) unilateral (HCC)   HTN (hypertension)   PVD (peripheral vascular disease) (HCC)   Elevated troponin   Pulmonary hypertension (HCC)    Chronic systolic congestive heart failure, NYHA class 1 (HCC)   Tachycardia   ICM-25-30% March 2017 echo   Ischemic ulcer of toe of right foot ([place])   CAD in native artery  Aortic stenosis, severe   Coronary artery disease involving coronary bypass graft of native heart   Dilated aortic root ([place])   1. Severe PVD: ischemic ulcer on right foot. Day 2 s/p revascularization with right Peripheral Vascular Atherectomy per Dr. Trula Slade. Per Vascular. Going to OR today for transmetatarsal amputation of the RLE.  2. Severe/ Critical AS: seen by Dr. Prescott Gum. It is felt that he would not survive redo-sternotomy open AVR - he appears to be a potential candidate for TAVR after his active infection- gangrene is resolved. He denies CP, dyspnea, syncope/ near syncope. Transfusion given yesterday for anemia. Hgb stable.   3. Chronic Systolic HF: EF 123XX123. On Coreg and Lasix. No ACE/ARB given renal insufficieny. I/Os are in the positive 6.1 L but he does not appear to have any excess peripheral edema. Normal breathing. Monitor volume status closely. Continue lasix.   4. CAD s/p CABG x 5 in 2010: denies any anginal symptoms.   5. Anemia: s/p transfusion 1 unit of blood. Hgb improved from 8.0>>10.9   LOS: 6 days    Brittainy M. Ladoris Gene 06/11/2016 9:34 AM  Personally seen and examined. Agree with above. No new cardiac recs at this time.   Candee Furbish, MD

## 2016-06-11 NOTE — Transfer of Care (Signed)
Immediate Anesthesia Transfer of Care Note  Patient: Josey Packard  Procedure(s) Performed: Procedure(s): RIGHT TRANSMETATARSAL AMPUTATION (Right)  Patient Location: PACU  Anesthesia Type:MAC  Level of Consciousness: awake, alert  and oriented  Airway & Oxygen Therapy: Patient Spontanous Breathing and Patient connected to nasal cannula oxygen  Post-op Assessment: Report given to RN and Post -op Vital signs reviewed and stable  Post vital signs: Reviewed and stable  Last Vitals:  Filed Vitals:   06/11/16 1100 06/11/16 1212  BP: 100/54 100/60  Pulse: 64 64  Temp:  37.1 C  Resp: 23 9    Last Pain:  Filed Vitals:   06/11/16 1216  PainSc: 0-No pain      Patients Stated Pain Goal: 0 ([id number])  Complications: No apparent anesthesia complications

## 2016-06-11 NOTE — Op Note (Signed)
    NAMEJevonte Holder   MRN: [id number] DOB: [birth year]    DATE OF OPERATION: 06/11/2016  PREOP DIAGNOSIS: Gangrene of the right first, fourth, and fifth toes.  POSTOP DIAGNOSIS: Same  PROCEDURE: Right transmetatarsal amputation  SURGEON: Judeth Cornfield. Scot Dock, MD, FACS  ASSIST: Vernie Murders, RNFA  ANESTHESIA: popliteal block   EBL: minimal  INDICATIONS: Anthony Holder is a 75 y.o. male wwho presented with gangrene of the right first and fourth and fifth toes.  He underwent angioplasty and stenting of the right superficial femoral artery, popliteal artery and peroneal arteries. He presents for  Transmetatarsal amputation.  FINDINGS: The wound involving the right great toe extended fairly far back on the dorsum of the foot.  There was also a patch of ischemic tissue on the lateral aspect of the foot that extended fairly far back. This reason the incision on the plantar aspect of the foot was further back then normal in order to encompass these areas.  TECHNIQUE: The patient was taken to the operating room after a popliteal block was placed. The right foot was prepped and draped in usual sterile fashion. The incision on the plantar aspect of the foot was made just proximal to the areas of necrotic tissue both on the medial aspect and lateral aspect of the foot. The incision was then extended across the dorsum of the foot. Dissection was carried down to the metatarsal bones. These were individually divided using a CD 4 saw. The forefoot was then removed sharply. Hemostasis was obtained using electrocautery and small clips. I then beveled the plantar aspect of the bone for each metatarsal in order to allow for  A more ergonomic pushoff. The deep layer was closed with interrupted 3-0 Vicryl's. The skin was closed staples. The posterior flap appeared to be under some tension and therefore I made small incisions using a 15 blade  In order to release some of this tension. A sterile dressing was  applied. The patient tolerated the procedure well and was transferred to the recovery room in stable condition. All needle and sponge counts were correct.   [name], MD, FACS [facility]  DATE OF DICTATION:   06/11/2016

## 2016-06-11 NOTE — Progress Notes (Signed)
Patient sleeping when RN entered room (as evidenced by snoring).  RN completed vitals signs and patient did not awaken.  RN noted patient had been incontinent and bed sheets and gown were wet.  RN called patient's name several times and sternal rubbed patient and patient aroused. RN asked patient to open his eyes and patient briefly opened eyes.  Patient did not assist with turning in order to change linen.  After changing patient's linen and gown RN asked patient what his name was and patient replied you've asked me this 5,000 times it's the same as it was last night.  RN explained to patient that he seemed very sleepy and RN was just checking on him.  RN asked patient if he could tell RN his name and patient replied poontang.  RN asked patient the year and patient replied 2017.  Due to incontinent episode, RN explained to patient RN was going to apply a condom catheter.  Patient had no objections.

## 2016-06-11 NOTE — Anesthesia Preprocedure Evaluation (Signed)
Anesthesia Evaluation  Patient identified by MRN, date of birth, ID band Patient awake    Reviewed: Allergy & Precautions, NPO status , Patient's Chart, lab work & pertinent test results  Airway Mallampati: II  TM Distance: >3 FB Neck ROM: Full    Dental   Pulmonary COPD, former smoker,    breath sounds clear to auscultation       Cardiovascular hypertension, Pt. on medications and Pt. on home beta blockers + CAD, + CABG, + Peripheral Vascular Disease and +CHF  + Valvular Problems/Murmurs AS  Rhythm:Regular Rate:Normal  Severe AS with mean gradient of 63mmHg and low EF 25-30%.   Neuro/Psych negative neurological ROS     GI/Hepatic negative GI ROS, Neg liver ROS,   Endo/Other  diabetes, Type 2  Renal/GU CRFRenal disease     Musculoskeletal  (+) Arthritis ,   Abdominal   Peds  Hematology  (+) anemia ,   Anesthesia Other Findings   Reproductive/Obstetrics                             Lab Results  Component Value Date   WBC 11.9* 06/11/2016   HGB 10.9* 06/11/2016   HCT 33.2* 06/11/2016   MCV 82.2 06/11/2016   PLT 263 06/11/2016   Lab Results  Component Value Date   CREATININE 1.69* 06/11/2016   BUN 17 06/11/2016   NA 135 06/11/2016   K 3.5 06/11/2016   CL 103 06/11/2016   CO2 25 06/11/2016   Lab Results  Component Value Date   INR 1.55* 06/09/2016   INR 1.36 06/05/2016   INR 1.29 06/04/2016    Anesthesia Physical Anesthesia Plan  ASA: IV  Anesthesia Plan: MAC and Regional   Post-op Pain Management:    Induction: Intravenous  Airway Management Planned: Simple Face Mask  Additional Equipment:   Intra-op Plan:   Post-operative Plan:   Informed Consent: I have reviewed the patients History and Physical, chart, labs and discussed the procedure including the risks, benefits and alternatives for the proposed anesthesia with the patient or authorized representative who  has indicated his/her understanding and acceptance.     Plan Discussed with: CRNA  Anesthesia Plan Comments:         Anesthesia Quick Evaluation
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
## 2016-06-11 NOTE — H&P (View-Only) (Signed)
Subjective: Interval History: none.. Comfortable this morning. Wife is present in the room with him. Continues to have some mild shortness of breath but this is no change since yesterday.  Objective: Vital signs in last 24 hours: Temp:  [97.9 F (36.6 C)-98.2 F (36.8 C)] 98.1 F (36.7 C) (06/22 0500) Pulse Rate:  [0-85] 80 (06/22 0500) Resp:  [0-68] 14 (06/21 2125) BP: (89-181)/(49-151) 101/59 mmHg (06/22 0500) SpO2:  [0 %-99 %] 93 % (06/22 0500) Weight:  [189 lb (85.73 kg)] 189 lb (85.73 kg) (06/22 0500)  Intake/Output from previous day: 06/21 0701 - 06/22 0700 In: 1014.8 [P.O.:400; I.V.:350; IV Piggyback:264.8] Out: 875 [Urine:875] Intake/Output this shift:    Easily palpable right popliteal pulse and biphasic dorsalis pedis and peroneal signal. No change in right foot. Dry gangrene of his fifth toe with some extension onto the metatarsal head. Dry gangrene of the entire metatarsal head laterally on the great toe.  Lab Results:  Recent Labs  06/09/16 0430 06/10/16 0410  WBC 12.4* 12.1*  HGB 8.9* 8.0*  HCT 27.8* 25.1*  PLT 237 247   BMET  Recent Labs  06/09/16 0430 06/10/16 0410  NA 137 132*  K 3.6 3.5  CL 101 99*  CO2 26 25  GLUCOSE 151* 157*  BUN 20 20  CREATININE 1.79* 1.70*  CALCIUM 8.7* 8.1*    Studies/Results: Dg Chest 2 View  06/05/2016  CLINICAL DATA:  Shortness breath EXAM: CHEST  2 VIEW COMPARISON:  06/03/2016 chest radiograph. FINDINGS: Sternotomy wires appear aligned and intact. CABG clips overlie the mediastinum. Partially visualized surgical hardware from ACDF overlying the lower cervical spine. Stable cardiomediastinal silhouette with mild cardiomegaly. No pneumothorax. Stable trace left pleural effusion. No right pleural effusion. No pulmonary edema. No acute consolidative airspace disease. Stable mild left basilar atelectasis. IMPRESSION: 1. Stable mild cardiomegaly without pulmonary edema. 2. Stable trace left pleural effusion and mild left  basilar atelectasis. Electronically Signed   By: Ilona Sorrel M.D.   On: 06/05/2016 12:26   Dg Chest 2 View  06/03/2016  CLINICAL DATA:  Chest pain EXAM: CHEST  2 VIEW COMPARISON:  March 03, 2016 FINDINGS: There is mild scarring in the left base. Lungs elsewhere are clear. Heart is upper normal in size with pulmonary vascularity within normal limits. Patient is status post coronary artery bypass grafting. No adenopathy. There is postoperative change in the lower cervical spine region. IMPRESSION: Scarring left base. No edema or consolidation. Stable cardiac silhouette. Electronically Signed   By: Lowella Grip III M.D.   On: 06/03/2016 13:19   Mr Foot Right W Wo Contrast  06/02/2016  CLINICAL DATA:  Nonhealing wounds at the distal aspect of the right first metatarsal and between the fourth and fifth toes for 4-5 weeks. Question abscess or osteomyelitis. EXAM: MRI OF THE RIGHT FOREFOOT WITHOUT AND WITH CONTRAST TECHNIQUE: Multiplanar, multisequence MR imaging was performed both before and after administration of intravenous contrast. CONTRAST:  18 ml MULTIHANCE GADOBENATE DIMEGLUMINE 529 MG/ML IV SOLN COMPARISON:  Plain films of the right foot 05/24/2016. FINDINGS: There may be a skin wound along the head of the first metatarsal versus artifactual loss of visualization of cutaneous tissues. Artifact is favored. No abscess is identified. No bone marrow signal abnormality to suggest osteomyelitis is seen. Mild first MTP degenerative change is seen with some associated marrow edema in the medial sesamoid. Intrinsic musculature the foot demonstrates some fatty replacement with associated mildly increased T2 signal. IMPRESSION: Negative for abscess or osteomyelitis. Mild first MTP  osteoarthritis. Electronically Signed   By: Inge Rise M.D.   On: 06/02/2016 16:03   Dg Foot Complete Right  05/24/2016  CLINICAL DATA:  Nonhealing wound in the first toe EXAM: RIGHT FOOT COMPLETE - 3+ VIEW COMPARISON:  None.  FINDINGS: Mild degenerative changes of the first MTP joint are noted. No acute fracture or dislocation is seen. Mild degenerative changes are noted in the tarsal bones as well. No gross soft tissue abnormality is seen. No bony erosion to suggest osteomyelitis is noted. IMPRESSION: Degenerative change without acute bony abnormality. Electronically Signed   By: Inez Catalina M.D.   On: 05/24/2016 21:18   Anti-infectives: Anti-infectives    Start     Dose/Rate Route Frequency Ordered Stop   06/03/16 1400  piperacillin-tazobactam (ZOSYN) IVPB 3.375 g     3.375 g 12.5 mL/hr over 240 Minutes Intravenous Every 8 hours 06/03/16 1352     06/03/16 1400  vancomycin (VANCOCIN) IVPB 1000 mg/200 mL premix     1,000 mg 200 mL/hr over 60 Minutes Intravenous Every 12 hours 06/03/16 1352        Assessment/Plan: s/p Procedure(s) with comments: Abdominal Aortogram (N/A) Peripheral Vascular Atherectomy (Right) - Superficial femoral, popliteal, peroneal Creatinine stable at 1.7 following yesterday's procedure. Excellent result now with palpable popliteal pulse and much better flow to his foot. Discussed options with the patient and his wife this morning. Feel that he needs definitive surgical treatment to his foot while he has markedly improved flow for hopes for healing. Recommend transmetatarsal amputation. Does have involvement of his first and fifth metatarsal head. Feel he would have a much harder time healing a ray amputation of his first and fifth toe with minimal improvement as far as walking. Plan surgery for tomorrow. Hopefully can do this with a block so that he does not require general anesthesia with his severe aortic stenosis and low ejection fraction. Patient has many questions regarding ongoing treatment regarding his heart disease. Explained would have to take care of the infectious issue regarding his foot before any possible cardiac intervention could be entertained. Patient is wife understand to  proceed with surgery tomorrow. Also explained understand that this has risk of nonhealing and would result in below-knee amputation.   Anemic with a hemoglobin of 8 and hematocrit of 25. Will defer need for transfusion to primary team in light of his congestive heart failure. Should not lose a great deal of blood with transmetatarsal amputation tomorrow.   LOS: 5 days   Cyndee Giammarco 06/10/2016, 8:30 AM

## 2016-06-11 NOTE — Anesthesia Postprocedure Evaluation (Signed)
Anesthesia Post Note  Patient: [name]  Procedure(s) Performed: Procedure(s) (LRB): RIGHT TRANSMETATARSAL AMPUTATION (Right)  Patient location during evaluation: PACU Anesthesia Type: MAC and Regional Level of consciousness: awake and alert Pain management: pain level controlled Vital Signs Assessment: post-procedure vital signs reviewed and stable Respiratory status: spontaneous breathing, nonlabored ventilation, respiratory function stable and patient connected to nasal cannula oxygen Cardiovascular status: stable and blood pressure returned to baseline Anesthetic complications: no    Last Vitals:  Filed Vitals:   06/11/16 1341 06/11/16 1342  BP: 84/52 104/57  Pulse: 62 59  Temp:  36.7 C  Resp: 25 16    Last Pain:  Filed Vitals:   06/11/16 1353  PainSc: 0-No pain                 Anthony Holder

## 2016-06-11 NOTE — Anesthesia Procedure Notes (Addendum)
Procedure Name: MAC Date/Time: 06/11/2016 11:19 AM Performed by: Mariea Clonts Pre-anesthesia Checklist: Patient identified, Patient being monitored, Emergency Drugs available, Timeout performed and Suction available Patient Re-evaluated:Patient Re-evaluated prior to inductionOxygen Delivery Method: Simple face mask   Anesthesia Regional Block:  Popliteal block  Pre-Anesthetic Checklist: ,, timeout performed, Correct Patient, Correct Site, Correct Laterality, Correct Procedure, Correct Position, site marked, Risks and benefits discussed,  Surgical consent,  Pre-op evaluation,  At surgeon's request and post-op pain management  Laterality: Right  Prep: chloraprep       Needles:  Injection technique: Single-shot  Needle Type: Echogenic Needle     Needle Length: 9cm 9 cm Needle Gauge: 21 and 21 G    Additional Needles:  Procedures: ultrasound guided (picture in chart) Popliteal block Narrative:  Start time: 06/11/2016 10:35 AM End time: 06/11/2016 10:42 AM Injection made incrementally with aspirations every 5 mL.  Performed by: Personally  Anesthesiologist: Suzette Battiest

## 2016-06-11 NOTE — Progress Notes (Signed)
[facility]                                                                              Patient Demographics  Anthony Holder, is a 75 y.o. male, DOB - 07-19-1941, [id number]  Admit date - 06/03/2016   Admitting Physician Albertine Patricia, MD  Outpatient Primary MD for the patient is [name], MD  Outpatient specialists:   Chief Complaint  Patient presents with  . Tachycardia  . Toe Pain      Brief summary   Patient is a 75 year old male with severe PVD,with prior left BKA and known ischemic ulcers right foot, chronic systolic CHF, AS with PAH, hypertension, history of CAD/ CABG 5 in 2010, DM, BPH, dyslipidemia and history of CVA. Patient recently underwent aortogram with right lower extremity runoff and may was found to have significant disease that was not amenable to intervention or surgery and medical therapy was opted for by the vascular surgeon. The past 2 days he noticed increased darkening of the pinky toe of the right foot with pain. He also noticed palpitations. He underwent an outpatient MRI on 6/14 that showed negative findings regarding abscess or osteomyelitis. During further workup patient underwent left heart cath showing multivessel disease and severe aortic stenosis along with severe systolic dysfunction with EF 25%, cardiology following, he also had evidence of right foot fifth toe dry gangrene with surrounding cellulitis and severe PAD. Vascular surgery following. Pt had right leg angioplasty on 06/09/2016, cardiology, vascular surgery following. Cardiology is consulted in cardiothoracic surgery for multivessel disease and severe AS.  Cardiothoracic surgery following for possible TAVR.    Assessment & Plan   Elevated troponin/Hx of CABG x 5 2010/abnormal EKG, cardiomyopathy, palpitations - Patient without chest pain, troponin trended up to 1.08, new ST segment downsloping and T-wave inversion on EKG. Cardiology consulted, per Dr.  Percival Spanish, needs a right and left cardiac cath, Cardiology following. Currently chest pain-free, on aspirin, beta blocker for secondary prevention, has statin allergy. He underwent catheterization on 06/07/2016 showing multivessel disease, EF of 25% along with severe AS. Cardiology following and consulting cardiothoracic surgery for input.  Pt seemed to tolerated 1 unit PRBC on 6/22.   Aortic stenosis, moderate/Pulmonary hypertension - as above.   Mild acute on Chronic systolic congestive heart failure, NYHA class 1 EF 25%. With moderate to severe AS. Currently symptom-free, blood pressure is borderline, on beta blocker and Lasix, not on ACE inhibitor or ARB due to soft blood pressures and moderate to severe AS. On 06/08/2016 he was mildly short of breath as he received IV fluids for cardiac catheterization prep, extra Lasix today and monitor.  PVD (peripheral vascular disease)/S/P BKA (below knee amputation) unilateral/ Ischemic ulcer of right foot with dry gangrene of the fifth toe and then around the first metatarsal head with some surrounding cellulitis  - blood cultures NGTD, no other source of infection, UA unremarkable, chest x-ray unimpressive, Continue IV vancomycin and Zosyn. Vascular surgery following, discussed his case with [name] on 06/08/2016, vascular surgery will attempt right leg angioplasty on 06/09/2016. For now no plans of amputation.  Pt had endovascular procedures on 6/21  below:  #1 successful atherectomy and angioplasty of a occluded right peroneal artery using a CSI 1.25 device and a coyote 3 mm balloon. Residual stenosis was less than 10% #2 successful atherectomy and drug coated balloon angioplasty of the right superficial femoral and popliteal artery treating 90% stenosis. Residual stenosis was less than 10%. #3 2 non-flow-limiting dissections were seen in the popliteal superficial femoral artery.  Pt is scheduled for transmetatarsal amputation  6/23 with vascular surgery. - see notes.   Plan to continue zosyn and vancomycin for now.  Blood cultures no growth to date.    HTN (hypertension) -Blood pressure currently well-controlled on Tenormin  Hyperlipidemia -Continue omega-3 fatty acids  BPH (benign prostatic hypertrophy)-Monitor for acute urinary retention. Good urine output reported.   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  Hg improved to 10.9.   FOBT, anemia panel inconclusive but most consistent with iron deficiency.  Will need to get him on iron supplement daily after transfusion.    CKD 3 - following creatinine.  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  Will place on low dextrose infusion while NPO to avoid hypoglycemia.     Lab Results  Component Value Date   HGBA1C 8.0* 06/03/2016   CBG (last 3)   Recent Labs  06/10/16 1621 06/10/16 2114 06/11/16 0729  GLUCAP 250* 231* 104*    Code Status: Full CODE STATUS DVT Prophylaxis:  Lovenox  Family Communication: Discussed with the patient and wife Daily bedside  Disposition Plan: Stay inpatient  Procedures:   TTE - Left ventricle: The cavity size was normal. Wall thickness was normal. Systolic function was severely reduced. The estimated ejection fraction was in the range of 25% to 30%. Akinesis of the inferior myocardium. - Aortic valve: There was moderate to severe stenosis. Valve area (VTI): 0.66 cm^2. Valve area (Vmax): 0.62 cm^2. Valve area (Vmean): 0.58 cm^2. - Left atrium: The atrium was mildly dilated. - Pulmonary arteries: Systolic pressure was moderately increased. PA peak pressure: 54 mm Hg (S).  MR R foot - no osteomyelitis or abscess.  L heart Cath 06-07-16 - multivessel CAD with severe aortic stenosis  Consultants:    Cardiology Vascular  surgery Cardiology Has consulted Dr. Lucianne Lei tright cardiothoracic surgery  Antibiotics :   Anti-infectives    Start     Dose/Rate Route Frequency Ordered Stop   06/11/16 0945  vancomycin (VANCOCIN) IVPB 1000 mg/200 mL premix     1,000 mg 200 mL/hr over 60 Minutes Intravenous Every 12 hours 06/11/16 0930     06/03/16 1400  piperacillin-tazobactam (ZOSYN) IVPB 3.375 g     3.375 g 12.5 mL/hr over 240 Minutes Intravenous Every 8 hours 06/03/16 1352     06/03/16 1400  vancomycin (VANCOCIN) IVPB 1000 mg/200 mL premix  Status:  Discontinued     1,000 mg 200 mL/hr over 60 Minutes Intravenous Every 12 hours 06/03/16 1352 06/11/16 0930     Medications  Scheduled Meds: . aspirin EC  81 mg Oral Daily  . baclofen  10 mg Oral Daily  . carvedilol  12.5 mg Oral BID WC  . docusate sodium  100 mg Oral Daily  . enoxaparin (LOVENOX) injection  40 mg Subcutaneous Q24H  . furosemide  40 mg Oral BID  . gabapentin  300 mg Oral TID  . insulin aspart  0-5 Units Subcutaneous QHS  . insulin aspart  0-9 Units Subcutaneous TID WC  . insulin aspart  3 Units Subcutaneous TID WC  . insulin NPH Human  20 Units Subcutaneous BID AC & HS  . pantoprazole  40 mg Oral Daily  . piperacillin-tazobactam (ZOSYN)  IV  3.375 g Intravenous Q8H  . potassium chloride SA  20 mEq Oral Daily  . vancomycin  1,000 mg Intravenous Q12H   Continuous Infusions:   PRN Meds:.sodium chloride, acetaminophen **OR** acetaminophen, gi cocktail, guaiFENesin-dextromethorphan, hydrALAZINE, labetalol, magnesium sulfate 1 - 4 g bolus IVPB, metoprolol, morphine injection, ondansetron, phenol, potassium chloride, traMADol, zolpidem   Subjective:   Mariana Sachdev  Pt is not complaining but had some confusion yesterday and had to be re-oriented  Objective:   Filed Vitals:   06/10/16 2243 06/11/16 0130 06/11/16 0500 06/11/16 0501  BP: 118/60 111/62  119/59  Pulse: 83 75  74  Temp: 99.8 F (37.7 C) 100 F (37.8 C)  100.2 F (37.9 C)    TempSrc: Oral Axillary  Axillary  Resp: 28 24  25   Height:      Weight:   187 lb 1.6 oz (84.868 kg)   SpO2: 94% 96%  98%    Intake/Output Summary (Last 24 hours) at 06/11/16 0933 Last data filed at 06/11/16 0840  Gross per 24 hour  Intake 1008.54 ml  Output    550 ml  Net 458.54 ml    Wt Readings from Last 3 Encounters:  06/11/16 187 lb 1.6 oz (84.868 kg)  05/10/16 190 lb (86.183 kg)  05/06/16 196 lb (88.905 kg)   Exam  General: Alert and oriented x 3, NAD  HEENT:    Neck: Supple, no JVD, no masses  Cardiovascular: S1 S2 auscultated, no rubs, Loud aortic systolic murmur, Regular rate and rhythm.  Respiratory: Clear to auscultation bilaterally, no wheezing, rales or rhonchi  Gastrointestinal: Soft, nontender, nondistended, + bowel sounds  Ext: no cyanosis clubbing, left BKA  Neuro: no new deficit  Skin: Right 4,5 and great toe dry gangrene with eschar  Psych: Normal affect and demeanor, alert and oriented x3    Data Reviewed:  I have personally reviewed following labs and imaging studies  Micro Results Recent Results (from the past 240 hour(s))  Urine culture     Status: None   Collection Time: 06/06/16 10:37 AM  Result Value Ref Range Status   Specimen Description URINE, RANDOM  Final   Special Requests NONE  Final   Culture NO GROWTH  Final   Report Status 06/07/2016 FINAL  Final  Culture, blood (Routine X 2) w Reflex to ID Panel     Status: None (Preliminary result)   Collection Time: 06/06/16  5:09 PM  Result Value Ref Range Status   Specimen Description BLOOD RIGHT HAND  Final   Special Requests BOTTLES DRAWN AEROBIC AND ANAEROBIC 5CC  Final   Culture NO GROWTH 4 DAYS  Final   Report Status PENDING  Incomplete  Culture, blood (Routine X 2) w Reflex to ID Panel     Status: None (Preliminary result)   Collection Time: 06/06/16  5:09 PM  Result Value Ref Range Status   Specimen Description BLOOD RIGHT HAND  Final   Special Requests IN PEDIATRIC  BOTTLE 5CC  Final   Culture NO GROWTH 4 DAYS  Final   Report Status PENDING  Incomplete  Surgical pcr screen     Status: None   Collection Time: 06/10/16 10:04 PM  Result  Value Ref Range Status   MRSA, PCR NEGATIVE NEGATIVE Final   Staphylococcus aureus NEGATIVE NEGATIVE Final    Comment:        The Xpert SA Assay (FDA approved for NASAL specimens in patients over 75 years of age), is one component of a comprehensive surveillance program.  Test performance has been validated by [facility] for patients greater than or equal to 60 year old. It is not intended to diagnose infection nor to guide or monitor treatment.    Radiology Reports Dg Chest 2 View  06/05/2016  CLINICAL DATA:  Shortness breath EXAM: CHEST  2 VIEW COMPARISON:  06/03/2016 chest radiograph. FINDINGS: Sternotomy wires appear aligned and intact. CABG clips overlie the mediastinum. Partially visualized surgical hardware from ACDF overlying the lower cervical spine. Stable cardiomediastinal silhouette with mild cardiomegaly. No pneumothorax. Stable trace left pleural effusion. No right pleural effusion. No pulmonary edema. No acute consolidative airspace disease. Stable mild left basilar atelectasis. IMPRESSION: 1. Stable mild cardiomegaly without pulmonary edema. 2. Stable trace left pleural effusion and mild left basilar atelectasis. Electronically Signed   By: Ilona Sorrel M.D.   On: 06/05/2016 12:26   Dg Chest 2 View  06/03/2016  CLINICAL DATA:  Chest pain EXAM: CHEST  2 VIEW COMPARISON:  March 03, 2016 FINDINGS: There is mild scarring in the left base. Lungs elsewhere are clear. Heart is upper normal in size with pulmonary vascularity within normal limits. Patient is status post coronary artery bypass grafting. No adenopathy. There is postoperative change in the lower cervical spine region. IMPRESSION: Scarring left base. No edema or consolidation. Stable cardiac silhouette. Electronically Signed   By: Lowella Grip  III M.D.   On: 06/03/2016 13:19   Mr Foot Right W Wo Contrast  06/02/2016  CLINICAL DATA:  Nonhealing wounds at the distal aspect of the right first metatarsal and between the fourth and fifth toes for 4-5 weeks. Question abscess or osteomyelitis. EXAM: MRI OF THE RIGHT FOREFOOT WITHOUT AND WITH CONTRAST TECHNIQUE: Multiplanar, multisequence MR imaging was performed both before and after administration of intravenous contrast. CONTRAST:  18 ml MULTIHANCE GADOBENATE DIMEGLUMINE 529 MG/ML IV SOLN COMPARISON:  Plain films of the right foot 05/24/2016. FINDINGS: There may be a skin wound along the head of the first metatarsal versus artifactual loss of visualization of cutaneous tissues. Artifact is favored. No abscess is identified. No bone marrow signal abnormality to suggest osteomyelitis is seen. Mild first MTP degenerative change is seen with some associated marrow edema in the medial sesamoid. Intrinsic musculature the foot demonstrates some fatty replacement with associated mildly increased T2 signal. IMPRESSION: Negative for abscess or osteomyelitis. Mild first MTP osteoarthritis. Electronically Signed   By: Inge Rise M.D.   On: 06/02/2016 16:03   Dg Foot Complete Right  05/24/2016  CLINICAL DATA:  Nonhealing wound in the first toe EXAM: RIGHT FOOT COMPLETE - 3+ VIEW COMPARISON:  None. FINDINGS: Mild degenerative changes of the first MTP joint are noted. No acute fracture or dislocation is seen. Mild degenerative changes are noted in the tarsal bones as well. No gross soft tissue abnormality is seen. No bony erosion to suggest osteomyelitis is noted. IMPRESSION: Degenerative change without acute bony abnormality. Electronically Signed   By: Inez Catalina M.D.   On: 05/24/2016 21:18   Lab Data:  CBC:  Recent Labs Lab 06/08/16 0401 06/08/16 1549 06/09/16 0430 06/10/16 0410 06/11/16 0425  WBC 13.0* 12.4* 12.4* 12.1* 11.9*  HGB 10.6* 9.6* 8.9* 8.0* 10.9*  HCT  33.5* 30.4* 27.8* 25.1* 33.2*    MCV 85.2 84.7 83.0 84.5 82.2  PLT 302 256 237 247 [id number]   Basic Metabolic Panel:  Recent Labs Lab 06/07/16 0451 06/08/16 0401 06/09/16 0430 06/10/16 0410 06/11/16 0425  NA 131* 134* 137 132* 135  K 3.6 4.0 3.6 3.5 3.5  CL 98* 100* 101 99* 103  CO2 24 22 26 25 25   GLUCOSE 159* 280* 151* 157* 135*  BUN 15 15 20 20 17   CREATININE 1.25* 1.42* 1.79* 1.70* 1.69*  CALCIUM 8.2* 8.7* 8.7* 8.1* 8.4*   GFR: Estimated Creatinine Clearance: 39.6 mL/min (by C-G formula based on Cr of 1.69). Liver Function Tests:  Recent Labs Lab 06/10/16 0410  AST 30  ALT 21  ALKPHOS 51  BILITOT 0.5  PROT 5.8*  ALBUMIN 2.0*   No results for input(s): LIPASE, AMYLASE in the last 168 hours. No results for input(s): AMMONIA in the last 168 hours. Coagulation Profile:  Recent Labs Lab 06/05/16 0958 06/09/16 0430  INR 1.36 1.55*   Cardiac Enzymes: No results for input(s): CKTOTAL, CKMB, CKMBINDEX, TROPONINI in the last 168 hours. BNP (last 3 results) No results for input(s): PROBNP in the last 8760 hours. HbA1C: No results for input(s): HGBA1C in the last 72 hours. CBG:  Recent Labs Lab 06/10/16 0742 06/10/16 1147 06/10/16 1621 06/10/16 2114 06/11/16 0729  GLUCAP 138* 194* 250* 231* 104*   Lipid Profile: No results for input(s): CHOL, HDL, LDLCALC, TRIG, CHOLHDL, LDLDIRECT in the last 72 hours. Thyroid Function Tests: No results for input(s): TSH, T4TOTAL, FREET4, T3FREE, THYROIDAB in the last 72 hours. Anemia Panel: No results for input(s): VITAMINB12, FOLATE, FERRITIN, TIBC, IRON, RETICCTPCT in the last 72 hours. Urine analysis:    Component Value Date/Time   COLORURINE YELLOW 06/06/2016 1037   APPEARANCEUR HAZY* 06/06/2016 1037   LABSPEC 1.022 06/06/2016 1037   PHURINE 5.5 06/06/2016 1037   GLUCOSEU 100* 06/06/2016 1037   HGBUR SMALL* 06/06/2016 1037   BILIRUBINUR NEGATIVE 06/06/2016 1037   KETONESUR NEGATIVE 06/06/2016 1037   PROTEINUR 100* 06/06/2016 1037    UROBILINOGEN 1.0 06/13/2015 0957   NITRITE NEGATIVE 06/06/2016 1037   LEUKOCYTESUR NEGATIVE 06/06/2016 1037   Signature  Clanford Johnson M.D on 06/11/2016 at 9:33 AM  Between 7am to 7pm - Pager - [phone], After 7pm go to [email or web address] - password [facility]  [facility]  - Office  [phone]

## 2016-06-11 NOTE — Interval H&P Note (Signed)
History and Physical Interval Note:  06/11/2016 10:55 AM  Anthony Holder  has presented today for surgery, with the diagnosis of Gangrene right first and fifth toe metatarsal head I96  The various methods of treatment have been discussed with the patient and family. After consideration of risks, benefits and other options for treatment, the patient has consented to  Procedure(s): TRANSMETATARSAL AMPUTATION (Right) as a surgical intervention .  The patient's history has been reviewed, patient examined, no change in status, stable for surgery.  I have reviewed the patient's chart and labs.  Questions were answered to the patient's satisfaction.     [name]

## 2016-06-12 LAB — CBC
HCT: 30.6 % — ABNORMAL LOW (ref 39.0–52.0)
Hemoglobin: 9.9 g/dL — ABNORMAL LOW (ref 13.0–17.0)
MCH: 26.8 pg (ref 26.0–34.0)
MCHC: 32.4 g/dL (ref 30.0–36.0)
MCV: 82.7 fL (ref 78.0–100.0)
Platelets: 287 10*3/uL (ref 150–400)
RBC: 3.7 MIL/uL — ABNORMAL LOW (ref 4.22–5.81)
RDW: 15.5 % (ref 11.5–15.5)
WBC: 14.3 10*3/uL — ABNORMAL HIGH (ref 4.0–10.5)

## 2016-06-12 LAB — COMPREHENSIVE METABOLIC PANEL WITH GFR
ALT: 26 U/L (ref 17–63)
AST: 18 U/L (ref 15–41)
Albumin: 2.1 g/dL — ABNORMAL LOW (ref 3.5–5.0)
Alkaline Phosphatase: 51 U/L (ref 38–126)
Anion gap: 8 (ref 5–15)
BUN: 17 mg/dL (ref 6–20)
CO2: 25 mmol/L (ref 22–32)
Calcium: 8.3 mg/dL — ABNORMAL LOW (ref 8.9–10.3)
Chloride: 101 mmol/L (ref 101–111)
Creatinine, Ser: 1.76 mg/dL — ABNORMAL HIGH (ref 0.61–1.24)
GFR calc Af Amer: 42 mL/min — ABNORMAL LOW
GFR calc non Af Amer: 36 mL/min — ABNORMAL LOW
Glucose, Bld: 261 mg/dL — ABNORMAL HIGH (ref 65–99)
Potassium: 3.4 mmol/L — ABNORMAL LOW (ref 3.5–5.1)
Sodium: 134 mmol/L — ABNORMAL LOW (ref 135–145)
Total Bilirubin: 0.8 mg/dL (ref 0.3–1.2)
Total Protein: 6.2 g/dL — ABNORMAL LOW (ref 6.5–8.1)

## 2016-06-12 LAB — GLUCOSE, CAPILLARY
GLUCOSE-CAPILLARY: 257 mg/dL — AB (ref 65–99)
GLUCOSE-CAPILLARY: 235 mg/dL — AB (ref 65–99)
Glucose-Capillary: 222 mg/dL — ABNORMAL HIGH (ref 65–99)
Glucose-Capillary: 202 mg/dL — ABNORMAL HIGH (ref 65–99)

## 2016-06-12 MED ORDER — POTASSIUM CHLORIDE CRYS ER 20 MEQ PO TBCR
40.0000 meq | EXTENDED_RELEASE_TABLET | Freq: Once | ORAL | Status: AC
  Administered 2016-06-12: 40 meq via ORAL
  Filled 2016-06-12: qty 2

## 2016-06-12 NOTE — Progress Notes (Signed)
   Daily Progress Note  Assessment/Planning: POD #1 s/p s/p TMA, POD #3 R fem-pop, TPT, and peroneal OA+PTA   Dressing off tomorrow   Subjective  - 1 Day Post-Op  Sleeping, pain controlled per family  Objective Filed Vitals:   06/11/16 1615 06/11/16 1933 06/12/16 0025 06/12/16 0500  BP: 101/53 127/70  111/57  Pulse: 59 86  73  Temp: 97.9 F (36.6 C) 98.4 F (36.9 C) 100.9 F (38.3 C) 98 F (36.7 C)  TempSrc: Oral Oral Oral Oral  Resp: 16 18  16   Height:      Weight:    190 lb 8 oz (86.41 kg)  SpO2: 96% 95%  95%    Intake/Output Summary (Last 24 hours) at 06/12/16 0758 Last data filed at 06/12/16 0700  Gross per 24 hour  Intake 1603.95 ml  Output   1595 ml  Net   8.95 ml    VASC  R foot bandaged, no blood on dressing  Laboratory CBC    Component Value Date/Time   WBC 14.3* 06/12/2016 0229   HGB 9.9* 06/12/2016 0229   HCT 30.6* 06/12/2016 0229   PLT 287 06/12/2016 0229    BMET    Component Value Date/Time   NA 134* 06/12/2016 0229   K 3.4* 06/12/2016 0229   CL 101 06/12/2016 0229   CO2 25 06/12/2016 0229   GLUCOSE 261* 06/12/2016 0229   BUN 17 06/12/2016 0229   CREATININE 1.76* 06/12/2016 0229   CREATININE 1.35* 04/22/2016 1542   CALCIUM 8.3* 06/12/2016 0229   GFRNONAA 36* 06/12/2016 0229   GFRNONAA 52* 04/22/2016 1542   GFRAA 42* 06/12/2016 0229   GFRAA 60 04/22/2016 1542    Adele Barthel, MD [facility] [place]: [phone] Pager: [phone]  06/12/2016, 7:58 AM

## 2016-06-12 NOTE — Progress Notes (Signed)
Patient ID: Anthony Holder, male   DOB: 1941/04/18, 75 y.o.   MRN: [id number]    Patient Profile: 74 yo male with PMH of CAD s/p CABGx5 with RCEA in 2010, severe LV systolic HF with EF of 123XX123 and moderate to severe AS, DM with neuropathy, CRI-2, PAD s/p L BKA 2013 2/2 chronic diabetic foot infection who presented with R foot pain 2/2 severe RLE arterial disease.  Day 1 s/p right transmetatarsal amputation   Subjective: Doing well. No complaints. He denies CP and dyspnea. Dressing dry   Objective: BP 111/57 mmHg  Pulse 73  Temp(Src) 98 F (36.7 C) (Oral)  Resp 16  Ht 5\' 10"  (1.778 m)  Wt 190 lb 8 oz (86.41 kg)  BMI 27.33 kg/m2  SpO2 95%  I/O last 3 completed shifts: In: 2612.5 [P.O.:1040; I.V.:365; Blood:670; IV Piggyback:537.5] Out: [id number]; Blood:20]     Medications Current Facility-Administered Medications  Medication Dose Route Frequency Provider Last Rate Last Dose  . 0.9 %  sodium chloride infusion  500 mL Intravenous Once PRN Alvia Grove, PA-C      . acetaminophen (TYLENOL) tablet 325-650 mg  325-650 mg Oral Q4H PRN Alvia Grove, PA-C   650 mg at 06/12/16 0026   Or  . acetaminophen (TYLENOL) suppository 325-650 mg  325-650 mg Rectal Q4H PRN Alvia Grove, PA-C      . aspirin EC tablet 81 mg  81 mg Oral Daily Samella Parr, NP   81 mg at 06/11/16 0836  . baclofen (LIORESAL) tablet 10 mg  10 mg Oral Daily Samella Parr, NP   10 mg at 06/11/16 0836  . carvedilol (COREG) tablet 12.5 mg  12.5 mg Oral BID WC Jerline Pain, MD   12.5 mg at 06/12/16 0840  . docusate sodium (COLACE) capsule 100 mg  100 mg Oral Daily Alvia Grove, PA-C   100 mg at 06/11/16 0837  . enoxaparin (LOVENOX) injection 40 mg  40 mg Subcutaneous Q24H Samantha J Rhyne, PA-C   40 mg at 06/12/16 0842  . furosemide (LASIX) tablet 40 mg  40 mg Oral BID Josue Hector, MD   40 mg at 06/12/16 0840  . gabapentin (NEURONTIN) capsule 300 mg  300 mg Oral TID Samella Parr, NP   300  mg at 06/11/16 2201  . gi cocktail (Maalox,Lidocaine,Donnatal)  30 mL Oral QID PRN Samella Parr, NP      . guaiFENesin-dextromethorphan (ROBITUSSIN DM) 100-10 MG/5ML syrup 15 mL  15 mL Oral Q4H PRN Alvia Grove, PA-C      . hydrALAZINE (APRESOLINE) injection 5 mg  5 mg Intravenous Q20 Min PRN Alvia Grove, PA-C      . insulin aspart (novoLOG) injection 0-5 Units  0-5 Units Subcutaneous QHS Samella Parr, NP   2 Units at 06/11/16 2201  . insulin aspart (novoLOG) injection 0-9 Units  0-9 Units Subcutaneous TID WC Samella Parr, NP   3 Units at 06/10/16 1845  . insulin aspart (novoLOG) injection 3 Units  3 Units Subcutaneous TID WC Thurnell Lose, MD   3 Units at 06/10/16 1846  . insulin NPH Human (HUMULIN N,NOVOLIN N) injection 20 Units  20 Units Subcutaneous BID AC & HS Thurnell Lose, MD   20 Units at 06/11/16 2201  . labetalol (NORMODYNE,TRANDATE) injection 10 mg  10 mg Intravenous Q10 min PRN Alvia Grove, PA-C      . lactated ringers infusion   Intravenous  Continuous Suzette Battiest, MD 10 mL/hr at 06/11/16 1052    . magnesium sulfate IVPB 2 g 50 mL  2 g Intravenous Daily PRN Alvia Grove, PA-C      . metoprolol (LOPRESSOR) injection 2-5 mg  2-5 mg Intravenous Q2H PRN Alvia Grove, PA-C      . morphine 2 MG/ML injection 2 mg  2 mg Intravenous Q2H PRN Samella Parr, NP   2 mg at 06/07/16 1129  . ondansetron (ZOFRAN) injection 4 mg  4 mg Intravenous Q6H PRN Alvia Grove, PA-C      . oxyCODONE-acetaminophen (PERCOCET/ROXICET) 5-325 MG per tablet 1-2 tablet  1-2 tablet Oral Q4H PRN Angelia Mould, MD   1 tablet at 06/12/16 [phone]  . pantoprazole (PROTONIX) EC tablet 40 mg  40 mg Oral Daily Alvia Grove, PA-C   40 mg at 06/11/16 0836  . phenol (CHLORASEPTIC) mouth spray 1 spray  1 spray Mouth/Throat PRN Alvia Grove, PA-C      . piperacillin-tazobactam (ZOSYN) IVPB 3.375 g  3.375 g Intravenous Q8H Wynell Balloon, RPH 12.5 mL/hr at 06/12/16 0553  3.375 g at 06/12/16 0553  . potassium chloride SA (K-DUR,KLOR-CON) CR tablet 20 mEq  20 mEq Oral Daily Samella Parr, NP   20 mEq at 06/11/16 0837  . potassium chloride SA (K-DUR,KLOR-CON) CR tablet 20-40 mEq  20-40 mEq Oral Daily PRN Alvia Grove, PA-C      . traMADol (ULTRAM) tablet 50 mg  50 mg Oral Q6H PRN Samella Parr, NP   50 mg at 06/09/16 1710  . vancomycin (VANCOCIN) IVPB 1000 mg/200 mL premix  1,000 mg Intravenous Q12H Karren Cobble, RPH   1,000 mg at 06/11/16 2204  . zolpidem (AMBIEN) tablet 5 mg  5 mg Oral QHS PRN Jeryl Columbia, NP   5 mg at 06/09/16 2204    PE: General appearance: alert, cooperative and no distress Neck: no JVD and + radiation of AS murmur bilaterally Lungs: clear to auscultation bilaterally Heart: regular rate and rhythm and 4/6 systolic murmur throughout the precordium, loudest at the RUSB Extremities: s/p left BKA, grangrenous right LEE Pulses: 2+ and symmetric Skin: warm and dry Neurologic: Grossly normal  Lab Results:   Recent Labs  06/10/16 0410 06/11/16 0425 06/12/16 0229  WBC 12.1* 11.9* 14.3*  HGB 8.0* 10.9* 9.9*  HCT 25.1* 33.2* 30.6*  PLT 247 263 287   BMET  Recent Labs  06/10/16 0410 06/11/16 0425 06/12/16 0229  NA 132* 135 134*  K 3.5 3.5 3.4*  CL 99* 103 101  CO2 25 25 25   GLUCOSE 157* 135* 261*  BUN 20 17 17   CREATININE 1.70* 1.69* 1.76*  CALCIUM 8.1* 8.4* 8.3*    Assessment/Plan  Principal Problem:   Ischemic ulcer of right foot (HCC) Active Problems:   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   Hyperlipidemia   Hx of CABG x 5 2010   Carotid artery disease-RCEA 2010   Spinal stenosis of lumbar region   BPH (benign prostatic hypertrophy)   History of CVA (cerebrovascular accident)   Moderate to severe aortic stenosis   S/P BKA (below knee amputation) unilateral (HCC)   HTN (hypertension)   PVD (peripheral vascular disease) (HCC)   Elevated troponin   Pulmonary hypertension  (HCC)   Chronic systolic congestive heart failure, NYHA class 1 (HCC)   Tachycardia   ICM-25-30% March 2017 echo   Ischemic ulcer of toe of right  foot ([place])   CAD in native artery   Aortic stenosis, severe   Coronary artery disease involving coronary bypass graft of native heart   Dilated aortic root ([place])   1. Severe PVD: ischemic ulcer on right foot. Day 1 s/p  Transmetatarsal amputation. Had popliteal nerve block not general Dressing intact plan per [facility]  2. Severe/ Critical AS: seen by Dr. Prescott Gum. It is felt that he would not survive redo-sternotomy open AVR - he appears to be a potential candidate for TAVR after his active infection- gangrene is resolved. He denies CP, dyspnea, syncope/ near syncope.   3. Chronic Systolic HF: EF 123XX123. On Coreg and Lasix. No ACE/ARB given renal insufficieny. I/Os are in the positive 5.6 L but he does not appear to have any excess peripheral edema. Normal breathing. Monitor volume status closely. Continue lasix. 40 bid   4. CAD s/p CABG x 5 in 2010: denies any anginal symptoms.    Jenkins Rouge

## 2016-06-12 NOTE — Progress Notes (Addendum)
[facility]                                                                              Patient Demographics  Anthony Holder, is a 75 y.o. male, DOB - 1941-09-06, JJO:[id number]  Admit date - 06/03/2016   Admitting Physician Starleen Arms, MD  Outpatient Primary MD for the patient is [name], MD  Outpatient specialists:   Chief Complaint  Patient presents with  . Tachycardia  . Toe Pain      Brief summary   Patient is a 75 year old male with severe PVD,with prior left BKA and known ischemic ulcers right foot, chronic systolic CHF, AS with PAH, hypertension, history of CAD/ CABG 5 in 2010, DM, BPH, dyslipidemia and history of CVA. Patient recently underwent aortogram with right lower extremity runoff and may was found to have significant disease that was not amenable to intervention or surgery and medical therapy was opted for by the vascular surgeon. The past 2 days he noticed increased darkening of the pinky toe of the right foot with pain. He also noticed palpitations. He underwent an outpatient MRI on 6/14 that showed negative findings regarding abscess or osteomyelitis. During further workup patient underwent left heart cath showing multivessel disease and severe aortic stenosis along with severe systolic dysfunction with EF 25%, cardiology following, he also had evidence of right foot fifth toe dry gangrene with surrounding cellulitis and severe PAD. Vascular surgery following. Pt had right leg angioplasty on 06/09/2016, cardiology, vascular surgery following. Cardiology is consulted in cardiothoracic surgery for multivessel disease and severe AS.  Cardiothoracic surgery following for possible TAVR.    Assessment & Plan   Elevated troponin/Hx of CABG x 5 2010/abnormal EKG, cardiomyopathy, palpitations - Patient without chest pain, troponin trended up to 1.08, new ST segment downsloping and T-wave inversion on EKG. Cardiology consulted, per Dr.  Antoine Poche, needs a right and left cardiac cath, Cardiology following. Currently chest pain-free, on aspirin, beta blocker for secondary prevention, has statin allergy. He underwent catheterization on 06/07/2016 showing multivessel disease, EF of 25% along with severe AS. Cardiology following and consulting cardiothoracic surgery for input.  Pt seemed to tolerated 1 unit PRBC on 6/22.   Aortic stenosis, moderate/Pulmonary hypertension - as above.   Mild acute on Chronic systolic congestive heart failure, NYHA class 1 EF 25%. With moderate to severe AS. Currently symptom-free, blood pressure is borderline, on beta blocker and Lasix, not on ACE inhibitor or ARB due to soft blood pressures and moderate to severe AS.  Continue diuretics per cardiology service.    PVD (peripheral vascular disease)/S/P BKA (below knee amputation) unilateral/ Ischemic ulcer of right foot with dry gangrene of the fifth toe and then around the first metatarsal head with some surrounding cellulitis  - blood cultures NGTD, no other source of infection, UA unremarkable, chest x-ray unimpressive, Continue IV vancomycin and Zosyn. Vascular surgery following, discussed his case with Dr. Arbie Cookey on 06/08/2016, vascular surgery will attempt right leg angioplasty on 06/09/2016. For now no plans of amputation.  Pt had endovascular procedures on 6/21 below:  #1 successful atherectomy and angioplasty of a occluded right peroneal artery using  a CSI 1.25 device and a coyote 3 mm balloon. Residual stenosis was less than 10% #2 successful atherectomy and drug coated balloon angioplasty of the right superficial femoral and popliteal artery treating 90% stenosis. Residual stenosis was less than 10%. #3 2 non-flow-limiting dissections were seen in the popliteal superficial femoral artery.  Pt is post-op from transmetatarsal amputation 6/23 with vascular surgery. - see notes.   Plan to continue zosyn and vancomycin for  now.  Blood cultures no growth x 5 days.    HTN (hypertension) -Blood pressure currently well-controlled on Tenormin  Hyperlipidemia -Continue omega-3 fatty acids  BPH (benign prostatic hypertrophy)-Monitor for acute urinary retention. Good urine output reported.   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  Hg improved to 10.9.   FOBT, anemia panel inconclusive but most consistent with iron deficiency.  Will need to get him on iron supplement daily after transfusion.    CKD 3 - following creatinine.  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  Will place on low dextrose infusion while NPO to avoid hypoglycemia.    Hypokalemia - ordered extra dose of oral potassium today.    Lab Results  Component Value Date   HGBA1C 8.0* 06/03/2016   CBG (last 3)   Recent Labs  06/11/16 1616 06/11/16 2058 06/12/16 0753  GLUCAP 120* 235* 222*    Code Status: Full CODE STATUS DVT Prophylaxis:  Lovenox  Family Communication: Discussed with the patient and wife Daily bedside  Disposition Plan: Stay inpatient  Procedures:   TTE - Left ventricle: The cavity size was normal. Wall thickness was normal. Systolic function was severely reduced. The estimated ejection fraction was in the range of 25% to 30%. Akinesis of the inferior myocardium. - Aortic valve: There was moderate to severe stenosis. Valve area (VTI): 0.66 cm^2. Valve area (Vmax): 0.62 cm^2. Valve area (Vmean): 0.58 cm^2. - Left atrium: The atrium was mildly dilated. - Pulmonary arteries: Systolic pressure was moderately increased. PA peak pressure: 54 mm Hg (S).  MR R foot - no osteomyelitis or abscess.  L heart Cath 06-07-16 - multivessel CAD with severe aortic stenosis 6/23 - transmetarsal amputation  Consultants:     Cardiology Vascular surgery Cardiology Has consulted Dr. Zenaida Niece tright cardiothoracic surgery  Antibiotics :   Anti-infectives    Start     Dose/Rate Route Frequency Ordered Stop   06/11/16 0945  vancomycin (VANCOCIN) IVPB 1000 mg/200 mL premix     1,000 mg 200 mL/hr over 60 Minutes Intravenous Every 12 hours 06/11/16 0930     06/03/16 1400  piperacillin-tazobactam (ZOSYN) IVPB 3.375 g     3.375 g 12.5 mL/hr over 240 Minutes Intravenous Every 8 hours 06/03/16 1352     06/03/16 1400  vancomycin (VANCOCIN) IVPB 1000 mg/200 mL premix  Status:  Discontinued     1,000 mg 200 mL/hr over 60 Minutes Intravenous Every 12 hours 06/03/16 1352 06/11/16 0930     Medications  Scheduled Meds: . aspirin EC  81 mg Oral Daily  . baclofen  10 mg Oral Daily  . carvedilol  12.5 mg Oral BID WC  . docusate sodium  100 mg Oral Daily  . enoxaparin (LOVENOX) injection  40 mg Subcutaneous Q24H  . furosemide  40 mg Oral BID  . gabapentin  300 mg Oral TID  .  insulin aspart  0-5 Units Subcutaneous QHS  . insulin aspart  0-9 Units Subcutaneous TID WC  . insulin aspart  3 Units Subcutaneous TID WC  . insulin NPH Human  20 Units Subcutaneous BID AC & HS  . pantoprazole  40 mg Oral Daily  . piperacillin-tazobactam (ZOSYN)  IV  3.375 g Intravenous Q8H  . potassium chloride SA  20 mEq Oral Daily  . vancomycin  1,000 mg Intravenous Q12H   Continuous Infusions: . lactated ringers 10 mL/hr at 06/11/16 1052   PRN Meds:.sodium chloride, acetaminophen **OR** acetaminophen, gi cocktail, guaiFENesin-dextromethorphan, hydrALAZINE, labetalol, magnesium sulfate 1 - 4 g bolus IVPB, metoprolol, morphine injection, ondansetron, oxyCODONE-acetaminophen, phenol, potassium chloride, traMADol, zolpidem   Subjective:   Devion Wachter  Pt resting comfortably, wife says pain has been controlled  Objective:   Filed Vitals:   06/11/16 1615 06/11/16 1933 06/12/16 0025 06/12/16 0500  BP: 101/53 127/70  111/57  Pulse: 59  86  73  Temp: 97.9 F (36.6 C) 98.4 F (36.9 C) 100.9 F (38.3 C) 98 F (36.7 C)  TempSrc: Oral Oral Oral Oral  Resp: 16 18  16   Height:      Weight:    190 lb 8 oz (86.41 kg)  SpO2: 96% 95%  95%    Intake/Output Summary (Last 24 hours) at 06/12/16 1028 Last data filed at 06/12/16 0700  Gross per 24 hour  Intake 1603.95 ml  Output   1595 ml  Net   8.95 ml    Wt Readings from Last 3 Encounters:  06/12/16 190 lb 8 oz (86.41 kg)  05/10/16 190 lb (86.183 kg)  05/06/16 196 lb (88.905 kg)   Exam  General: Alert and oriented x 3, NAD  HEENT:    Neck: Supple, no JVD, no masses  Cardiovascular: S1 S2 auscultated, no rubs, Loud aortic systolic murmur, Regular rate and rhythm.  Respiratory: Clear to auscultation bilaterally, no wheezing, rales or rhonchi  Gastrointestinal: Soft, nontender, nondistended, + bowel sounds  Ext: no cyanosis clubbing, left BKA  Neuro: no new deficit  Skin: bandages clean and dry  Psych: Normal affect and demeanor, alert and oriented x3    Data Reviewed:  I have personally reviewed following labs and imaging studies  Micro Results Recent Results (from the past 240 hour(s))  Urine culture     Status: None   Collection Time: 06/06/16 10:37 AM  Result Value Ref Range Status   Specimen Description URINE, RANDOM  Final   Special Requests NONE  Final   Culture NO GROWTH  Final   Report Status 06/07/2016 FINAL  Final  Culture, blood (Routine X 2) w Reflex to ID Panel     Status: None   Collection Time: 06/06/16  5:09 PM  Result Value Ref Range Status   Specimen Description BLOOD RIGHT HAND  Final   Special Requests BOTTLES DRAWN AEROBIC AND ANAEROBIC 5CC  Final   Culture NO GROWTH 5 DAYS  Final   Report Status 06/11/2016 FINAL  Final  Culture, blood (Routine X 2) w Reflex to ID Panel     Status: None   Collection Time: 06/06/16  5:09 PM  Result Value Ref Range Status   Specimen Description BLOOD RIGHT HAND  Final   Special Requests IN  PEDIATRIC BOTTLE 5CC  Final   Culture NO GROWTH 5 DAYS  Final   Report Status 06/11/2016 FINAL  Final  Surgical pcr screen     Status: None   Collection Time: 06/10/16 10:04  PM  Result Value Ref Range Status   MRSA, PCR NEGATIVE NEGATIVE Final   Staphylococcus aureus NEGATIVE NEGATIVE Final    Comment:        The Xpert SA Assay (FDA approved for NASAL specimens in patients over 27 years of age), is one component of a comprehensive surveillance program.  Test performance has been validated by [facility] for patients greater than or equal to 65 year old. It is not intended to diagnose infection nor to guide or monitor treatment.    Radiology Reports Dg Chest 2 View  06/05/2016  CLINICAL DATA:  Shortness breath EXAM: CHEST  2 VIEW COMPARISON:  06/03/2016 chest radiograph. FINDINGS: Sternotomy wires appear aligned and intact. CABG clips overlie the mediastinum. Partially visualized surgical hardware from ACDF overlying the lower cervical spine. Stable cardiomediastinal silhouette with mild cardiomegaly. No pneumothorax. Stable trace left pleural effusion. No right pleural effusion. No pulmonary edema. No acute consolidative airspace disease. Stable mild left basilar atelectasis. IMPRESSION: 1. Stable mild cardiomegaly without pulmonary edema. 2. Stable trace left pleural effusion and mild left basilar atelectasis. Electronically Signed   By: Delbert Phenix M.D.   On: 06/05/2016 12:26   Dg Chest 2 View  06/03/2016  CLINICAL DATA:  Chest pain EXAM: CHEST  2 VIEW COMPARISON:  March 03, 2016 FINDINGS: There is mild scarring in the left base. Lungs elsewhere are clear. Heart is upper normal in size with pulmonary vascularity within normal limits. Patient is status post coronary artery bypass grafting. No adenopathy. There is postoperative change in the lower cervical spine region. IMPRESSION: Scarring left base. No edema or consolidation. Stable cardiac silhouette. Electronically Signed   By:  Bretta Bang III M.D.   On: 06/03/2016 13:19   Mr Foot Right W Wo Contrast  06/02/2016  CLINICAL DATA:  Nonhealing wounds at the distal aspect of the right first metatarsal and between the fourth and fifth toes for 4-5 weeks. Question abscess or osteomyelitis. EXAM: MRI OF THE RIGHT FOREFOOT WITHOUT AND WITH CONTRAST TECHNIQUE: Multiplanar, multisequence MR imaging was performed both before and after administration of intravenous contrast. CONTRAST:  18 ml MULTIHANCE GADOBENATE DIMEGLUMINE 529 MG/ML IV SOLN COMPARISON:  Plain films of the right foot 05/24/2016. FINDINGS: There may be a skin wound along the head of the first metatarsal versus artifactual loss of visualization of cutaneous tissues. Artifact is favored. No abscess is identified. No bone marrow signal abnormality to suggest osteomyelitis is seen. Mild first MTP degenerative change is seen with some associated marrow edema in the medial sesamoid. Intrinsic musculature the foot demonstrates some fatty replacement with associated mildly increased T2 signal. IMPRESSION: Negative for abscess or osteomyelitis. Mild first MTP osteoarthritis. Electronically Signed   By: Drusilla Kanner M.D.   On: 06/02/2016 16:03   Dg Foot Complete Right  05/24/2016  CLINICAL DATA:  Nonhealing wound in the first toe EXAM: RIGHT FOOT COMPLETE - 3+ VIEW COMPARISON:  None. FINDINGS: Mild degenerative changes of the first MTP joint are noted. No acute fracture or dislocation is seen. Mild degenerative changes are noted in the tarsal bones as well. No gross soft tissue abnormality is seen. No bony erosion to suggest osteomyelitis is noted. IMPRESSION: Degenerative change without acute bony abnormality. Electronically Signed   By: Alcide Clever M.D.   On: 05/24/2016 21:18   Lab Data:  CBC:  Recent Labs Lab 06/08/16 1549 06/09/16 0430 06/10/16 0410 06/11/16 0425 06/12/16 0229  WBC 12.4* 12.4* 12.1* 11.9* 14.3*  HGB 9.6* 8.9* 8.0* 10.9*  9.9*  HCT 30.4* 27.8*  25.1* 33.2* 30.6*  MCV 84.7 83.0 84.5 82.2 82.7  PLT 256 237 247 263 287   Basic Metabolic Panel:  Recent Labs Lab 06/08/16 0401 06/09/16 0430 06/10/16 0410 06/11/16 0425 06/12/16 0229  NA 134* 137 132* 135 134*  K 4.0 3.6 3.5 3.5 3.4*  CL 100* 101 99* 103 101  CO2 22 26 25 25 25   GLUCOSE 280* 151* 157* 135* 261*  BUN 15 20 20 17 17   CREATININE 1.42* 1.79* 1.70* 1.69* 1.76*  CALCIUM 8.7* 8.7* 8.1* 8.4* 8.3*   GFR: Estimated Creatinine Clearance: 38 mL/min (by C-G formula based on Cr of 1.76). Liver Function Tests:  Recent Labs Lab 06/10/16 0410 06/12/16 0229  AST 30 18  ALT 21 26  ALKPHOS 51 51  BILITOT 0.5 0.8  PROT 5.8* 6.2*  ALBUMIN 2.0* 2.1*   No results for input(s): LIPASE, AMYLASE in the last 168 hours. No results for input(s): AMMONIA in the last 168 hours. Coagulation Profile:  Recent Labs Lab 06/09/16 0430  INR 1.55*   Cardiac Enzymes: No results for input(s): CKTOTAL, CKMB, CKMBINDEX, TROPONINI in the last 168 hours. BNP (last 3 results) No results for input(s): PROBNP in the last 8760 hours. HbA1C: No results for input(s): HGBA1C in the last 72 hours. CBG:  Recent Labs Lab 06/11/16 1050 06/11/16 1213 06/11/16 1616 06/11/16 2058 06/12/16 0753  GLUCAP 129* 113* 120* 235* 222*   Lipid Profile: No results for input(s): CHOL, HDL, LDLCALC, TRIG, CHOLHDL, LDLDIRECT in the last 72 hours. Thyroid Function Tests: No results for input(s): TSH, T4TOTAL, FREET4, T3FREE, THYROIDAB in the last 72 hours. Anemia Panel: No results for input(s): VITAMINB12, FOLATE, FERRITIN, TIBC, IRON, RETICCTPCT in the last 72 hours. Urine analysis:    Component Value Date/Time   COLORURINE YELLOW 06/06/2016 1037   APPEARANCEUR HAZY* 06/06/2016 1037   LABSPEC 1.022 06/06/2016 1037   PHURINE 5.5 06/06/2016 1037   GLUCOSEU 100* 06/06/2016 1037   HGBUR SMALL* 06/06/2016 1037   BILIRUBINUR NEGATIVE 06/06/2016 1037   KETONESUR NEGATIVE 06/06/2016 1037   PROTEINUR  100* 06/06/2016 1037   UROBILINOGEN 1.0 06/13/2015 0957   NITRITE NEGATIVE 06/06/2016 1037   LEUKOCYTESUR NEGATIVE 06/06/2016 1037   Signature  Haset Oaxaca M.D on 06/12/2016 at 10:28 AM  Between 7am to 7pm - Pager - [phone], After 7pm go to [email or web address] - password [facility]  [facility]  - Office  [phone]

## 2016-06-13 DIAGNOSIS — I272 Other secondary pulmonary hypertension: Secondary | ICD-10-CM

## 2016-06-13 DIAGNOSIS — Z89519 Acquired absence of unspecified leg below knee: Secondary | ICD-10-CM

## 2016-06-13 LAB — GLUCOSE, CAPILLARY
GLUCOSE-CAPILLARY: 98 mg/dL (ref 65–99)
GLUCOSE-CAPILLARY: 198 mg/dL — AB (ref 65–99)
GLUCOSE-CAPILLARY: 231 mg/dL — AB (ref 65–99)
Glucose-Capillary: 254 mg/dL — ABNORMAL HIGH (ref 65–99)

## 2016-06-13 NOTE — Progress Notes (Signed)
[facility]                                                                              Patient Demographics  Anthony Holder, is a 75 y.o. male, DOB - [birth year], YQM:[id number]  Admit date - 06/03/2016   Admitting Physician Starleen Arms, MD  Outpatient Primary MD for the patient is [name], MD  Outpatient specialists:   Chief Complaint  Patient presents with  . Tachycardia  . Toe Pain      Brief summary   Patient is a 75 year old male with severe PVD,with prior left BKA and known ischemic ulcers right foot, chronic systolic CHF, AS with PAH, hypertension, history of CAD/ CABG 5 in 2010, DM, BPH, dyslipidemia and history of CVA. Patient recently underwent aortogram with right lower extremity runoff and may was found to have significant disease that was not amenable to intervention or surgery and medical therapy was opted for by the vascular surgeon. The past 2 days he noticed increased darkening of the pinky toe of the right foot with pain. He also noticed palpitations. He underwent an outpatient MRI on 6/14 that showed negative findings regarding abscess or osteomyelitis. During further workup patient underwent left heart cath showing multivessel disease and severe aortic stenosis along with severe systolic dysfunction with EF 25%, cardiology following, he also had evidence of right foot fifth toe dry gangrene with surrounding cellulitis and severe PAD. Vascular surgery following. Pt had right leg angioplasty on 06/09/2016, cardiology, vascular surgery following. Cardiology is consulted in cardiothoracic surgery for multivessel disease and severe AS.  Cardiothoracic surgery following for possible TAVR.    Assessment & Plan   Elevated troponin/Hx of CABG x 5 2010/abnormal EKG, cardiomyopathy, palpitations - Patient without chest pain, troponin trended up to 1.08, new ST segment downsloping and T-wave inversion on EKG. Cardiology consulted, per Dr.  Antoine Poche, needs a right and left cardiac cath, Cardiology following. Currently chest pain-free, on aspirin, beta blocker for secondary prevention, has statin allergy. He underwent catheterization on 06/07/2016 showing multivessel disease, EF of 25% along with severe AS. Cardiology following and consulting cardiothoracic surgery for input.  Pt seemed to tolerated 1 unit PRBC on 6/22.   Aortic stenosis, moderate/Pulmonary hypertension - as above.   Mild acute on Chronic systolic congestive heart failure, NYHA class 1 EF 25%. With moderate to severe AS. Currently symptom-free, blood pressure is borderline, on beta blocker and Lasix, not on ACE inhibitor or ARB due to soft blood pressures and moderate to severe AS.  Continue diuretics per cardiology service.    PVD (peripheral vascular disease)/S/P BKA (below knee amputation) unilateral/ Ischemic ulcer of right foot with dry gangrene of the fifth toe and then around the first metatarsal head with some surrounding cellulitis  - blood cultures NGTD, no other source of infection, UA unremarkable, chest x-ray unimpressive, Continue IV vancomycin and Zosyn. Vascular surgery following, discussed his case with Dr. Arbie Cookey on 06/08/2016, vascular surgery will attempt right leg angioplasty on 06/09/2016. For now no plans of amputation.  Pt had endovascular procedures on 6/21 below:  #1 successful atherectomy and angioplasty of a occluded right peroneal artery using  a CSI 1.25 device and a coyote 3 mm balloon. Residual stenosis was less than 10% #2 successful atherectomy and drug coated balloon angioplasty of the right superficial femoral and popliteal artery treating 90% stenosis. Residual stenosis was less than 10%. #3 2 non-flow-limiting dissections were seen in the popliteal superficial femoral artery.  Pt is post-op from transmetatarsal amputation 6/23 with vascular surgery. - see notes.  Bandage changes started 6/25.    Plan to  continue zosyn and vancomycin for now.  Blood cultures no growth x 5 days.    HTN (hypertension) -Blood pressure currently well-controlled on Tenormin  Hyperlipidemia -Continue omega-3 fatty acids  BPH (benign prostatic hypertrophy)-Monitor for acute urinary retention. Good urine output reported.   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  Hg improved to 10.9.   FOBT, anemia panel inconclusive but most consistent with iron deficiency.  Will need to get him on iron supplement daily after transfusion.    CKD 3 - following creatinine. Had been stable. Recheck 6/26.  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  Will place on low dextrose infusion while NPO to avoid hypoglycemia.  Continue monitoring.  See below.    Hypokalemia - ordered extra dose of oral potassium today.    Lab Results  Component Value Date   HGBA1C 8.0* 06/03/2016   CBG (last 3)   Recent Labs  06/12/16 2053 06/13/16 0743 06/13/16 1133  GLUCAP 202* 98 198*    Code Status: Full CODE STATUS DVT Prophylaxis:  Lovenox  Family Communication: Discussed with the patient and wife Daily bedside  Disposition Plan: Stay inpatient  Procedures:   TTE - Left ventricle: The cavity size was normal. Wall thickness was normal. Systolic function was severely reduced. The estimated ejection fraction was in the range of 25% to 30%. Akinesis of the inferior myocardium. - Aortic valve: There was moderate to severe stenosis. Valve area (VTI): 0.66 cm^2. Valve area (Vmax): 0.62 cm^2. Valve area (Vmean): 0.58 cm^2. - Left atrium: The atrium was mildly dilated. - Pulmonary arteries: Systolic pressure was moderately increased. PA peak pressure: 54 mm Hg (S).  MR R foot - no osteomyelitis or abscess.  L heart Cath 06-07-16 - multivessel  CAD with severe aortic stenosis 6/23 - transmetarsal amputation  Consultants:    Cardiology Vascular surgery Cardiology Has consulted Dr. Zenaida Niece tright cardiothoracic surgery  Antibiotics :   Anti-infectives    Start     Dose/Rate Route Frequency Ordered Stop   06/11/16 0945  vancomycin (VANCOCIN) IVPB 1000 mg/200 mL premix     1,000 mg 200 mL/hr over 60 Minutes Intravenous Every 12 hours 06/11/16 0930     06/03/16 1400  piperacillin-tazobactam (ZOSYN) IVPB 3.375 g     3.375 g 12.5 mL/hr over 240 Minutes Intravenous Every 8 hours 06/03/16 1352     06/03/16 1400  vancomycin (VANCOCIN) IVPB 1000 mg/200 mL premix  Status:  Discontinued     1,000 mg 200 mL/hr over 60 Minutes Intravenous Every 12 hours 06/03/16 1352 06/11/16 0930     Medications  Scheduled Meds: . aspirin EC  81 mg Oral Daily  . baclofen  10 mg Oral Daily  . carvedilol  12.5 mg Oral BID WC  . docusate sodium  100 mg Oral Daily  . enoxaparin (LOVENOX) injection  40 mg Subcutaneous Q24H  .  furosemide  40 mg Oral BID  . gabapentin  300 mg Oral TID  . insulin aspart  0-5 Units Subcutaneous QHS  . insulin aspart  0-9 Units Subcutaneous TID WC  . insulin aspart  3 Units Subcutaneous TID WC  . insulin NPH Human  20 Units Subcutaneous BID AC & HS  . pantoprazole  40 mg Oral Daily  . piperacillin-tazobactam (ZOSYN)  IV  3.375 g Intravenous Q8H  . potassium chloride SA  20 mEq Oral Daily  . vancomycin  1,000 mg Intravenous Q12H   Continuous Infusions: . lactated ringers 10 mL/hr at 06/11/16 1052   PRN Meds:.sodium chloride, acetaminophen **OR** acetaminophen, gi cocktail, guaiFENesin-dextromethorphan, hydrALAZINE, labetalol, magnesium sulfate 1 - 4 g bolus IVPB, metoprolol, morphine injection, ondansetron, oxyCODONE-acetaminophen, phenol, potassium chloride, traMADol, zolpidem   Subjective:   Kayton Holts  Pt resting comfortably, pt in no distress   Objective:   Filed Vitals:   06/12/16 0500 06/12/16 1500  06/12/16 2106 06/13/16 0622  BP: 111/57 96/54 127/68 110/61  Pulse: 73 60 67 73  Temp: 98 F (36.7 C)  97.7 F (36.5 C) 98.4 F (36.9 C)  TempSrc: Oral  Oral Oral  Resp: 16 16 18 16   Height:      Weight: 190 lb 8 oz (86.41 kg)     SpO2: 95% 94% 97% 90%    Intake/Output Summary (Last 24 hours) at 06/13/16 1220 Last data filed at 06/13/16 0700  Gross per 24 hour  Intake      0 ml  Output    800 ml  Net   -800 ml    Wt Readings from Last 3 Encounters:  06/12/16 190 lb 8 oz (86.41 kg)  05/10/16 190 lb (86.183 kg)  05/06/16 196 lb (88.905 kg)   Exam  General: Alert and oriented x 3, NAD  HEENT:    Neck: Supple, no JVD, no masses  Cardiovascular: S1 S2 auscultated, no rubs, Loud aortic systolic murmur, Regular rate and rhythm.  Respiratory: Clear to auscultation bilaterally, no wheezing, rales or rhonchi  Gastrointestinal: Soft, nontender, nondistended, + bowel sounds  Ext: no cyanosis clubbing, left BKA, right foot wound clean and dry, dried blood seen, no s/s infection seen.   Neuro: no new deficit  Skin: bandages clean and dry  Psych: Normal affect and demeanor, alert and oriented x3    Data Reviewed:  I have personally reviewed following labs and imaging studies  Micro Results Recent Results (from the past 240 hour(s))  Urine culture     Status: None   Collection Time: 06/06/16 10:37 AM  Result Value Ref Range Status   Specimen Description URINE, RANDOM  Final   Special Requests NONE  Final   Culture NO GROWTH  Final   Report Status 06/07/2016 FINAL  Final  Culture, blood (Routine X 2) w Reflex to ID Panel     Status: None   Collection Time: 06/06/16  5:09 PM  Result Value Ref Range Status   Specimen Description BLOOD RIGHT HAND  Final   Special Requests BOTTLES DRAWN AEROBIC AND ANAEROBIC 5CC  Final   Culture NO GROWTH 5 DAYS  Final   Report Status 06/11/2016 FINAL  Final  Culture, blood (Routine X 2) w Reflex to ID Panel     Status: None    Collection Time: 06/06/16  5:09 PM  Result Value Ref Range Status   Specimen Description BLOOD RIGHT HAND  Final   Special Requests IN PEDIATRIC BOTTLE 5CC  Final  Culture NO GROWTH 5 DAYS  Final   Report Status 06/11/2016 FINAL  Final  Surgical pcr screen     Status: None   Collection Time: 06/10/16 10:04 PM  Result Value Ref Range Status   MRSA, PCR NEGATIVE NEGATIVE Final   Staphylococcus aureus NEGATIVE NEGATIVE Final    Comment:        The Xpert SA Assay (FDA approved for NASAL specimens in patients over 43 years of age), is one component of a comprehensive surveillance program.  Test performance has been validated by [facility] for patients greater than or equal to 108 year old. It is not intended to diagnose infection nor to guide or monitor treatment.    Radiology Reports Dg Chest 2 View  06/05/2016  CLINICAL DATA:  Shortness breath EXAM: CHEST  2 VIEW COMPARISON:  06/03/2016 chest radiograph. FINDINGS: Sternotomy wires appear aligned and intact. CABG clips overlie the mediastinum. Partially visualized surgical hardware from ACDF overlying the lower cervical spine. Stable cardiomediastinal silhouette with mild cardiomegaly. No pneumothorax. Stable trace left pleural effusion. No right pleural effusion. No pulmonary edema. No acute consolidative airspace disease. Stable mild left basilar atelectasis. IMPRESSION: 1. Stable mild cardiomegaly without pulmonary edema. 2. Stable trace left pleural effusion and mild left basilar atelectasis. Electronically Signed   By: Delbert Phenix M.D.   On: 06/05/2016 12:26   Dg Chest 2 View  06/03/2016  CLINICAL DATA:  Chest pain EXAM: CHEST  2 VIEW COMPARISON:  March 03, 2016 FINDINGS: There is mild scarring in the left base. Lungs elsewhere are clear. Heart is upper normal in size with pulmonary vascularity within normal limits. Patient is status post coronary artery bypass grafting. No adenopathy. There is postoperative change in the lower  cervical spine region. IMPRESSION: Scarring left base. No edema or consolidation. Stable cardiac silhouette. Electronically Signed   By: Bretta Bang III M.D.   On: 06/03/2016 13:19   Mr Foot Right W Wo Contrast  06/02/2016  CLINICAL DATA:  Nonhealing wounds at the distal aspect of the right first metatarsal and between the fourth and fifth toes for 4-5 weeks. Question abscess or osteomyelitis. EXAM: MRI OF THE RIGHT FOREFOOT WITHOUT AND WITH CONTRAST TECHNIQUE: Multiplanar, multisequence MR imaging was performed both before and after administration of intravenous contrast. CONTRAST:  18 ml MULTIHANCE GADOBENATE DIMEGLUMINE 529 MG/ML IV SOLN COMPARISON:  Plain films of the right foot 05/24/2016. FINDINGS: There may be a skin wound along the head of the first metatarsal versus artifactual loss of visualization of cutaneous tissues. Artifact is favored. No abscess is identified. No bone marrow signal abnormality to suggest osteomyelitis is seen. Mild first MTP degenerative change is seen with some associated marrow edema in the medial sesamoid. Intrinsic musculature the foot demonstrates some fatty replacement with associated mildly increased T2 signal. IMPRESSION: Negative for abscess or osteomyelitis. Mild first MTP osteoarthritis. Electronically Signed   By: Drusilla Kanner M.D.   On: 06/02/2016 16:03   Dg Foot Complete Right  05/24/2016  CLINICAL DATA:  Nonhealing wound in the first toe EXAM: RIGHT FOOT COMPLETE - 3+ VIEW COMPARISON:  None. FINDINGS: Mild degenerative changes of the first MTP joint are noted. No acute fracture or dislocation is seen. Mild degenerative changes are noted in the tarsal bones as well. No gross soft tissue abnormality is seen. No bony erosion to suggest osteomyelitis is noted. IMPRESSION: Degenerative change without acute bony abnormality. Electronically Signed   By: Alcide Clever M.D.   On: 05/24/2016 21:18  Lab Data:  CBC:  Recent Labs Lab 06/08/16 1549  06/09/16 0430 06/10/16 0410 06/11/16 0425 06/12/16 0229  WBC 12.4* 12.4* 12.1* 11.9* 14.3*  HGB 9.6* 8.9* 8.0* 10.9* 9.9*  HCT 30.4* 27.8* 25.1* 33.2* 30.6*  MCV 84.7 83.0 84.5 82.2 82.7  PLT 256 237 247 263 287   Basic Metabolic Panel:  Recent Labs Lab 06/08/16 0401 06/09/16 0430 06/10/16 0410 06/11/16 0425 06/12/16 0229  NA 134* 137 132* 135 134*  K 4.0 3.6 3.5 3.5 3.4*  CL 100* 101 99* 103 101  CO2 22 26 25 25 25   GLUCOSE 280* 151* 157* 135* 261*  BUN 15 20 20 17 17   CREATININE 1.42* 1.79* 1.70* 1.69* 1.76*  CALCIUM 8.7* 8.7* 8.1* 8.4* 8.3*   GFR: Estimated Creatinine Clearance: 38 mL/min (by C-G formula based on Cr of 1.76). Liver Function Tests:  Recent Labs Lab 06/10/16 0410 06/12/16 0229  AST 30 18  ALT 21 26  ALKPHOS 51 51  BILITOT 0.5 0.8  PROT 5.8* 6.2*  ALBUMIN 2.0* 2.1*   No results for input(s): LIPASE, AMYLASE in the last 168 hours. No results for input(s): AMMONIA in the last 168 hours. Coagulation Profile:  Recent Labs Lab 06/09/16 0430  INR 1.55*   Cardiac Enzymes: No results for input(s): CKTOTAL, CKMB, CKMBINDEX, TROPONINI in the last 168 hours. BNP (last 3 results) No results for input(s): PROBNP in the last 8760 hours. HbA1C: No results for input(s): HGBA1C in the last 72 hours. CBG:  Recent Labs Lab 06/12/16 1127 06/12/16 1642 06/12/16 2053 06/13/16 0743 06/13/16 1133  GLUCAP 257* 235* 202* 98 198*   Lipid Profile: No results for input(s): CHOL, HDL, LDLCALC, TRIG, CHOLHDL, LDLDIRECT in the last 72 hours. Thyroid Function Tests: No results for input(s): TSH, T4TOTAL, FREET4, T3FREE, THYROIDAB in the last 72 hours. Anemia Panel: No results for input(s): VITAMINB12, FOLATE, FERRITIN, TIBC, IRON, RETICCTPCT in the last 72 hours. Urine analysis:    Component Value Date/Time   COLORURINE YELLOW 06/06/2016 1037   APPEARANCEUR HAZY* 06/06/2016 1037   LABSPEC 1.022 06/06/2016 1037   PHURINE 5.5 06/06/2016 1037   GLUCOSEU  100* 06/06/2016 1037   HGBUR SMALL* 06/06/2016 1037   BILIRUBINUR NEGATIVE 06/06/2016 1037   KETONESUR NEGATIVE 06/06/2016 1037   PROTEINUR 100* 06/06/2016 1037   UROBILINOGEN 1.0 06/13/2015 0957   NITRITE NEGATIVE 06/06/2016 1037   LEUKOCYTESUR NEGATIVE 06/06/2016 1037   Signature  Adrienne Trombetta M.D on 06/13/2016 at 12:20 PM  Between 7am to 7pm - Pager - [phone], After 7pm go to [email or web address] - password [facility]  [facility]  - Office  [phone]

## 2016-06-13 NOTE — Progress Notes (Signed)
   Daily Progress Note  Assessment/Planning: POD #2 s/p TMA, POD #4 R fem-pop, TPT, and peroneal OA+PTA   Marginal appearing plantar flap, no clear if due to maceration vs ischemia  Keep ACE off for now.  Change bandages daily  Dr. Scot Dock back tomorrow  Subjective  - 2 Days Post-Op  No complaints, pain tolerated  Objective Filed Vitals:   06/12/16 0500 06/12/16 1500 06/12/16 2106 06/13/16 0622  BP: 111/57 96/54 127/68 110/61  Pulse: 73 60 67 73  Temp: 98 F (36.7 C)  97.7 F (36.5 C) 98.4 F (36.9 C)  TempSrc: Oral  Oral Oral  Resp: 16 16 18 16   Height:      Weight: 190 lb 8 oz (86.41 kg)     SpO2: 95% 94% 97% 90%    Intake/Output Summary (Last 24 hours) at 06/13/16 [id number] Last data filed at 06/12/16 2106  Gross per 24 hour  Intake      0 ml  Output    400 ml  Net   -400 ml    VASC  Plantar flap pallorous, dried blood on skin, inc c/d/i, viable appearing foot  Laboratory CBC    Component Value Date/Time   WBC 14.3* 06/12/2016 0229   HGB 9.9* 06/12/2016 0229   HCT 30.6* 06/12/2016 0229   PLT 287 06/12/2016 0229    BMET    Component Value Date/Time   NA 134* 06/12/2016 0229   K 3.4* 06/12/2016 0229   CL 101 06/12/2016 0229   CO2 25 06/12/2016 0229   GLUCOSE 261* 06/12/2016 0229   BUN 17 06/12/2016 0229   CREATININE 1.76* 06/12/2016 0229   CREATININE 1.35* 04/22/2016 1542   CALCIUM 8.3* 06/12/2016 0229   GFRNONAA 36* 06/12/2016 0229   GFRNONAA 52* 04/22/2016 1542   GFRAA 42* 06/12/2016 0229   GFRAA 60 04/22/2016 [place], MD [facility]: [phone] Pager: [phone]  06/13/2016, 8:12 AM

## 2016-06-14 ENCOUNTER — Encounter (HOSPITAL_COMMUNITY): Payer: Self-pay | Admitting: Vascular Surgery

## 2016-06-14 LAB — CBC
HCT: 32.1 % — ABNORMAL LOW (ref 39.0–52.0)
Hemoglobin: 10.1 g/dL — ABNORMAL LOW (ref 13.0–17.0)
MCH: 26.9 pg (ref 26.0–34.0)
MCHC: 31.5 g/dL (ref 30.0–36.0)
MCV: 85.6 fL (ref 78.0–100.0)
Platelets: 276 K/uL (ref 150–400)
RBC: 3.75 MIL/uL — ABNORMAL LOW (ref 4.22–5.81)
RDW: 15.9 % — ABNORMAL HIGH (ref 11.5–15.5)
WBC: 13.9 K/uL — ABNORMAL HIGH (ref 4.0–10.5)

## 2016-06-14 LAB — BASIC METABOLIC PANEL WITH GFR
Anion gap: 11 (ref 5–15)
BUN: 20 mg/dL (ref 6–20)
CO2: 23 mmol/L (ref 22–32)
Calcium: 8.6 mg/dL — ABNORMAL LOW (ref 8.9–10.3)
Chloride: 101 mmol/L (ref 101–111)
Creatinine, Ser: 1.64 mg/dL — ABNORMAL HIGH (ref 0.61–1.24)
GFR calc Af Amer: 46 mL/min — ABNORMAL LOW
GFR calc non Af Amer: 40 mL/min — ABNORMAL LOW
Glucose, Bld: 128 mg/dL — ABNORMAL HIGH (ref 65–99)
Potassium: 3.9 mmol/L (ref 3.5–5.1)
Sodium: 135 mmol/L (ref 135–145)

## 2016-06-14 LAB — GLUCOSE, CAPILLARY
GLUCOSE-CAPILLARY: 216 mg/dL — AB (ref 65–99)
Glucose-Capillary: 112 mg/dL — ABNORMAL HIGH (ref 65–99)
Glucose-Capillary: 179 mg/dL — ABNORMAL HIGH (ref 65–99)
Glucose-Capillary: 212 mg/dL — ABNORMAL HIGH (ref 65–99)

## 2016-06-14 LAB — TYPE AND SCREEN
ABO/RH(D): O POS
ANTIBODY SCREEN: POSITIVE
DAT, IgG: NEGATIVE
DAT, complement: NEGATIVE
DONOR AG TYPE: NEGATIVE
Donor AG Type: NEGATIVE
UNIT DIVISION: 0
Unit division: 0
Unit division: 0
Unit division: 0

## 2016-06-14 LAB — VANCOMYCIN, TROUGH: Vancomycin Tr: 43 ug/mL (ref 10.0–20.0)

## 2016-06-14 NOTE — Progress Notes (Signed)
Patient Name: Anthony Holder Date of Encounter: 06/14/2016  Primary Cardiologist: Dr. 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   Hyperlipidemia   Hx of CABG x 5 2010   Carotid artery disease-RCEA 2010   Spinal stenosis of lumbar region   BPH (benign prostatic hypertrophy)   History of CVA (cerebrovascular accident)   Moderate to severe aortic stenosis   S/P BKA (below knee amputation) unilateral (HCC)   HTN (hypertension)   PVD (peripheral vascular disease) (HCC)   Elevated troponin   Pulmonary hypertension (HCC)   Chronic systolic congestive heart failure, NYHA class 1 (HCC)   Tachycardia   ICM-25-30% March 2017 echo   Ischemic ulcer of toe of right foot ([place])   CAD in native artery   Aortic stenosis, severe   Coronary artery disease involving coronary bypass graft of native heart   Dilated aortic root ([place])    SUBJECTIVE  Denies any CP or SOB.   CURRENT MEDS . aspirin EC  81 mg Oral Daily  . baclofen  10 mg Oral Daily  . carvedilol  12.5 mg Oral BID WC  . docusate sodium  100 mg Oral Daily  . enoxaparin (LOVENOX) injection  40 mg Subcutaneous Q24H  . furosemide  40 mg Oral BID  . gabapentin  300 mg Oral TID  . insulin aspart  0-5 Units Subcutaneous QHS  . insulin aspart  0-9 Units Subcutaneous TID WC  . insulin aspart  3 Units Subcutaneous TID WC  . insulin NPH Human  20 Units Subcutaneous BID AC & HS  . pantoprazole  40 mg Oral Daily  . piperacillin-tazobactam (ZOSYN)  IV  3.375 g Intravenous Q8H  . potassium chloride SA  20 mEq Oral Daily  . vancomycin  1,000 mg Intravenous Q12H    OBJECTIVE  Filed Vitals:   06/13/16 0622 06/13/16 1700 06/13/16 2051 06/14/16 0500  BP: 110/61 119/61 112/64 117/54  Pulse: 73 73 66 70  Temp: 98.4 F (36.9 C) 99.6 F (37.6 C) 97.6 F (36.4 C) 99.4 F (37.4 C)  TempSrc: Oral Oral Oral Oral  Resp: 16 18    Height:        Weight:    184 lb 11.9 oz (83.8 kg)  SpO2: 90% 97% 94% 95%    Intake/Output Summary (Last 24 hours) at 06/14/16 0943 Last data filed at 06/14/16 0932  Gross per 24 hour  Intake   1030 ml  Output   1925 ml  Net   -895 ml   Filed Weights   06/11/16 0500 06/12/16 0500 06/14/16 0500  Weight: 187 lb 1.6 oz (84.868 kg) 190 lb 8 oz (86.41 kg) 184 lb 11.9 oz (83.8 kg)    PHYSICAL EXAM  General: Pleasant, NAD. Neuro: Alert and oriented X 3. Moves all extremities spontaneously. Psych: Normal affect. HEENT:  Normal  Neck: Supple without bruits or JVD. Lungs:  Resp regular and unlabored, CTA. Heart: RRR no s3, s4, or murmurs. Abdomen: Soft, non-tender, non-distended, BS + x 4.  Extremities: No clubbing, cyanosis or edema. L BKA, R metatarsal amputation    Accessory Clinical Findings  CBC  Recent Labs  06/12/16 0229 06/14/16 0523  WBC 14.3* 13.9*  HGB 9.9* 10.1*  HCT 30.6* 32.1*  MCV 82.7 85.6  PLT 287 [id number]   Basic Metabolic Panel  Recent Labs  06/12/16 0229 06/14/16 0523  NA 134* 135  K 3.4* 3.9  CL 101 101  CO2 25 23  GLUCOSE 261* 128*  BUN 17 20  CREATININE 1.76* 1.64*  CALCIUM 8.3* 8.6*   Liver Function Tests  Recent Labs  06/12/16 0229  AST 18  ALT 26  ALKPHOS 51  BILITOT 0.8  PROT 6.2*  ALBUMIN 2.1*    TELE NSR without significant ventricular ectopy    ECG  No new EKG  Echocardiogram 02/29/2016  LV EF: 25% - 30%  ------------------------------------------------------------------- Indications: Dyspnea 786.09.  ------------------------------------------------------------------- History: PMH: Coronary artery disease. Risk factors: Septic shock. Elevated troponin. Stroke. Aortic stenosis. Diabetes mellitus.  ------------------------------------------------------------------- Study Conclusions  - Left ventricle: The cavity size was normal. Wall thickness was  normal. Systolic function was severely reduced. The  estimated  ejection fraction was in the range of 25% to 30%. Akinesis of the  inferior myocardium. - Aortic valve: There was moderate to severe stenosis. Valve area  (VTI): 0.66 cm^2. Valve area (Vmax): 0.62 cm^2. Valve area  (Vmean): 0.58 cm^2. - Left atrium: The atrium was mildly dilated. - Pulmonary arteries: Systolic pressure was moderately increased.  PA peak pressure: 54 mm Hg (S).     Radiology/Studies  Dg Chest 2 View  06/05/2016  CLINICAL DATA:  Shortness breath EXAM: CHEST  2 VIEW COMPARISON:  06/03/2016 chest radiograph. FINDINGS: Sternotomy wires appear aligned and intact. CABG clips overlie the mediastinum. Partially visualized surgical hardware from ACDF overlying the lower cervical spine. Stable cardiomediastinal silhouette with mild cardiomegaly. No pneumothorax. Stable trace left pleural effusion. No right pleural effusion. No pulmonary edema. No acute consolidative airspace disease. Stable mild left basilar atelectasis. IMPRESSION: 1. Stable mild cardiomegaly without pulmonary edema. 2. Stable trace left pleural effusion and mild left basilar atelectasis. Electronically Signed   By: Ilona Sorrel M.D.   On: 06/05/2016 12:26   Dg Chest 2 View  06/03/2016  CLINICAL DATA:  Chest pain EXAM: CHEST  2 VIEW COMPARISON:  March 03, 2016 FINDINGS: There is mild scarring in the left base. Lungs elsewhere are clear. Heart is upper normal in size with pulmonary vascularity within normal limits. Patient is status post coronary artery bypass grafting. No adenopathy. There is postoperative change in the lower cervical spine region. IMPRESSION: Scarring left base. No edema or consolidation. Stable cardiac silhouette. Electronically Signed   By: Lowella Grip III M.D.   On: 06/03/2016 13:19   Mr Foot Right W Wo Contrast  06/02/2016  CLINICAL DATA:  Nonhealing wounds at the distal aspect of the right first metatarsal and between the fourth and fifth toes for 4-5 weeks. Question abscess  or osteomyelitis. EXAM: MRI OF THE RIGHT FOREFOOT WITHOUT AND WITH CONTRAST TECHNIQUE: Multiplanar, multisequence MR imaging was performed both before and after administration of intravenous contrast. CONTRAST:  18 ml MULTIHANCE GADOBENATE DIMEGLUMINE 529 MG/ML IV SOLN COMPARISON:  Plain films of the right foot 05/24/2016. FINDINGS: There may be a skin wound along the head of the first metatarsal versus artifactual loss of visualization of cutaneous tissues. Artifact is favored. No abscess is identified. No bone marrow signal abnormality to suggest osteomyelitis is seen. Mild first MTP degenerative change is seen with some associated marrow edema in the medial sesamoid. Intrinsic musculature the foot demonstrates some fatty replacement with associated mildly increased T2 signal. IMPRESSION: Negative for abscess or osteomyelitis. Mild first MTP osteoarthritis. Electronically Signed   By: Inge Rise M.D.   On: 06/02/2016 16:03   Dg Foot Complete Right  05/24/2016  CLINICAL DATA:  Nonhealing wound in  the first toe EXAM: RIGHT FOOT COMPLETE - 3+ VIEW COMPARISON:  None. FINDINGS: Mild degenerative changes of the first MTP joint are noted. No acute fracture or dislocation is seen. Mild degenerative changes are noted in the tarsal bones as well. No gross soft tissue abnormality is seen. No bony erosion to suggest osteomyelitis is noted. IMPRESSION: Degenerative change without acute bony abnormality. Electronically Signed   By: Inez Catalina M.D.   On: 05/24/2016 21:18    ASSESSMENT AND PLAN  1. Severe PVD: ischemic ulcer on right foot. S/p atherectomy with balloon angioplasty of R peroneal and tibioperoneal trunk, atherectomy with drug coated balloon angioplasty, R popliteal and superficial femoral artery 6/21. S/p Transmetatarsal amputation 6/23. Had popliteal nerve block not general Dressing intact plan per VVS  2. Severe/ Critical AS: seen by Dr. Prescott Gum. It is felt that he would not survive  redo-sternotomy open AVR - he appears to be a potential candidate for TAVR after his active infection- gangrene is resolved. He denies CP, dyspnea, syncope/ near syncope.   - no further cardiology recommendation. Potentially outpatient evaluation for TAVR once leg issue resolve. I will arrange followup.   3. Chronic Systolic HF: EF [id number]. On Coreg and Lasix. No ACE/ARB given renal insufficieny. I/Os are in the positive 5.6 L but he does not appear to have any excess peripheral edema. Normal breathing. Monitor volume status closely. Continue lasix. 40 bid   4. CAD s/p CABG x 5 in 2010: denies any anginal symptoms.   - L&RHC 06/07/2016 severe multivessel native CAD with 70% LM, 100% mid LAD, 100% mid RCA, patent LIMA to LAD, patent seq SVG to OM2/OM3, patent SVG to dist RCA, occluded SVG to ramus. LVEDP 31-20mm. PW 22. CO 5.1. CI 2.5.  Signed, Almyra Deforest PA-C Pager: [phone]  Still with swelling and erythema right foot. Severe AS murmur on exam Elective w/u for possible TAVR  In future but will be a while until his PVD issues are on right track  [employer]

## 2016-06-14 NOTE — Progress Notes (Signed)
MD also text paged concerning critical vancomycin level of 43. Awaiting call back. Thanks. Rosana Fret RN

## 2016-06-14 NOTE — Progress Notes (Signed)
Pharmacy Antibiotic Note  37 YOM with history of unilateral BKA presented on 06/07/16 with progressive skin changes to right foot.  Pharmacy consulted to manage vancomycin and Zosyn (abx day #12) for gangrenous right foot wound with ischemic ulcer/cellulits.  PTA MRI negative for osteomyelitis/abscess.  He is now s/p transmetatarsal amputation of 06/11/16.  He has CKD and his SCr has been relatively stable.  PM f/u: Vancomycin trough = 43, appears to have been drawn appropriately, next dose given before results were back.  Plan: - Hold vancomycin for now.  Recheck level tomorrow night (~ 24 hrs). - Continue vanc 1gm IV Q12H.  Check VT tonight. - Continue Zosyn 3.375gm IV Q8H, 4 hr infusion - Monitor renal fxn, clinical progress, abx LOT  Height: 5\' 10"  (177.8 cm) Weight: 184 lb 11.9 oz (83.8 kg) IBW/kg (Calculated) : 73  Temp (24hrs), Avg:99 F (37.2 C), Min:98.5 F (36.9 C), Max:99.4 F (37.4 C)   Recent Labs Lab 06/09/16 0430 06/10/16 0410 06/11/16 0425 06/12/16 0229 06/14/16 0523 06/14/16 2035  WBC 12.4* 12.1* 11.9* 14.3* 13.9*  --   CREATININE 1.79* 1.70* 1.69* 1.76* 1.64*  --   VANCOTROUGH  --   --   --   --   --  43*    Estimated Creatinine Clearance: 40.8 mL/min (by C-G formula based on Cr of 1.64).    Allergies  Allergen Reactions  . Statins Other (See Comments)    Patient prefers to not take statins    Antimicrobials this admission: Vanc 6/15 >> Zosyn 6/15 >>  Dose adjustments this admission: 6/17 VT = 17 mcg/mL (drawn ~1.5 hrs late) > no change  Microbiology results: 6/18 UCx - negative 6/18 BCx x2 - negative 6/22 MRSA PCR - negative  Uvaldo Rising, BCPS  Clinical Pharmacist Pager [phone]  06/14/2016 9:56 PM

## 2016-06-14 NOTE — Progress Notes (Signed)
Rehab Admissions Coordinator Note:  Patient was screened by Cleatrice Burke for appropriateness for an Inpatient Acute Rehab Consult per PT recommendation.   At this time, we are recommending Inpatient Rehab consult. Please place order.  Cleatrice Burke 06/14/2016, 7:12 PM  I can be reached at [phone].

## 2016-06-14 NOTE — Progress Notes (Signed)
Pharmacy Antibiotic Note  82 YOM with history of unilateral BKA presented on 06/07/16 with progressive skin changes to right foot.  Pharmacy consulted to manage vancomycin and Zosyn (abx day #12) for gangrenous right foot wound with ischemic ulcer/cellulits.  PTA MRI negative for osteomyelitis/abscess.  He is now s/p transmetatarsal amputation of 06/11/16.  He has CKD and his SCr has been relatively stable.  Plan: - Continue vanc 1gm IV Q12H.  Check VT tonight. - Continue Zosyn 3.375gm IV Q8H, 4 hr infusion - Monitor renal fxn, clinical progress, abx LOT  Height: 5\' 10"  (177.8 cm) Weight: 184 lb 11.9 oz (83.8 kg) IBW/kg (Calculated) : 73  Temp (24hrs), Avg:98.9 F (37.2 C), Min:97.6 F (36.4 C), Max:99.6 F (37.6 C)   Recent Labs Lab 06/09/16 0430 06/10/16 0410 06/11/16 0425 06/12/16 0229 06/14/16 0523  WBC 12.4* 12.1* 11.9* 14.3* 13.9*  CREATININE 1.79* 1.70* 1.69* 1.76* 1.64*    Estimated Creatinine Clearance: 40.8 mL/min (by C-G formula based on Cr of 1.64).    Allergies  Allergen Reactions  . Statins Other (See Comments)    Patient prefers to not take statins    Antimicrobials this admission: Vanc 6/15 >> Zosyn 6/15 >>  Dose adjustments this admission: 6/17 VT = 17 mcg/mL (drawn ~1.5 hrs late) > no change  Microbiology results: 6/18 UCx - negative 6/18 BCx x2 - negative 6/22 MRSA PCR - negative    [place]ceola Depaz D. Mina Marble, PharmD, BCPS Pager:  [phone] 06/14/2016, 10:32 AM

## 2016-06-14 NOTE — Progress Notes (Signed)
Orthopedic Tech Progress Note Patient Details:  Anthony Holder December 21, 1940 [id number] Fit pt. for Darco shoe and left same at bedside for later use. Ortho Devices Type of Ortho Device: Darco shoe Ortho Device/Splint Location: RLE Ortho Device/Splint Interventions: Other (comment)   Darrol Poke 06/14/2016, 3:18 PM

## 2016-06-14 NOTE — Progress Notes (Signed)
PT Cancellation Note  Patient Details Name: Anthony Holder MRN: [id number] DOB: 1941/04/18   Cancelled Treatment:    Reason Eval/Treat Not Completed: Other (comment); noted PT order to ambulate PWB with heel wedge Darco, but no order for this shoe.  Attempting to contact PA to get order for shoe.    Reginia Naas 06/14/2016, 2:29 PM  Magda Kiel, [place] 06/14/2016'

## 2016-06-14 NOTE — Evaluation (Signed)
Physical Therapy Evaluation Patient Details Name: Anthony Holder MRN: [id number] DOB: [birth year] Today's Date: 06/14/2016   History of Present Illness  75 yo male w/ hx CABG x 5 with RCEA in 2010, DM w/ neuropathy, CRI-2, s/p Lt BKA 2013 2nd chronic DM foot infection. Admitted 06/15 w/ dry gangrene R foot, cards seeing for CHF/CAD, R/L heart cath 06/19, R transmet amputation 6/23.  Clinical Impression  Patient presents with decreased mobility due to deficits listed in PT problem list below.  He will benefit from skilled PT in the acute setting to allow return home with family support following CIR level rehab stay.     Follow Up Recommendations CIR    Equipment Recommendations  None recommended by PT    Recommendations for Other Services Rehab consult;OT consult     Precautions / Restrictions Precautions Precautions: Fall Precaution Comments: wears prosthesis on LLE Required Braces or Orthoses: Other Brace/Splint Other Brace/Splint: R heel wedge darco Restrictions Weight Bearing Restrictions: Yes RLE Weight Bearing: Partial weight bearing RLE Partial Weight Bearing Percentage or Pounds: heel weight only       Mobility  Bed Mobility Overal bed mobility: Needs Assistance Bed Mobility: Supine to Sit;Sit to Supine     Supine to sit: Max assist Sit to supine: Mod assist   General bed mobility comments: assist for legs off bed and to lift trunk falling posterior after up to EOB; to supine assist for both legs into bed and to scoot up with bed in trendelenberg and using rail  Transfers Overall transfer level: Needs assistance   Transfers: Lateral/Scoot Transfers          Lateral/Scoot Transfers: Mod assist General transfer comment: barely able to clear hips with assist to scoot very small amount toward [facility] (had already attempted sit to stand from elevated height after increased time to don prosthesis, but pt unable with max A)  Ambulation/Gait                 Stairs            Wheelchair Mobility    Modified Rankin (Stroke Patients Only)       Balance Overall balance assessment: Needs assistance Sitting-balance support: Single extremity supported;Feet supported Sitting balance-Leahy Scale: Poor Sitting balance - Comments: initially falling posterior, then sitting at least 10 minutes to don L prosthesis with mod support and assist for donning leg due to L UE supported on rail jerking and risk for anterior LOB bending to don leg; once had leg on with both feet on floor able to sit at least 1 minute with supervision prior to return to supine                                     Pertinent Vitals/Pain Pain Assessment: No/denies pain    Home Living Family/patient expects to be discharged to:: Private residence Living Arrangements: Spouse/significant other Available Help at Discharge: Family Type of Home: Mobile home Home Access: Ramped entrance     Home Layout: One level Home Equipment: Environmental consultant - 2 wheels;Shower seat;Wheelchair - manual;Cane - single point      Prior Function Level of Independence: Independent with assistive device(s);Independent         Comments: pt wife reports up until a few wks ago pt ws I with ADLS and mod I with SPC for amb     Hand Dominance   Dominant Hand: Right  Extremity/Trunk Assessment   Upper Extremity Assessment: RUE deficits/detail;LUE deficits/detail RUE Deficits / Details: gross AROM WFL, less jerking than L, able to hold phone, to use it to don prosthesis with assist     LUE Deficits / Details: gross AROM WFL, has jerking with losing grip and reports was unable to use cell phone to call wife due to dropping it (mycolonic jerks)   Lower Extremity Assessment: RLE deficits/detail;LLE deficits/detail RLE Deficits / Details: transmet amputation with dressing on foot, lifts leg antigravity, decreased functional strength for transfers or for  LLE Deficits / Details: BKA  LLE wears prosthesis     Communication   Communication: HOH  Cognition Arousal/Alertness: Lethargic;Suspect due to medications Behavior During Therapy: [facility] for tasks assessed/performed Overall Cognitive Status: Within Functional Limits for tasks assessed                      General Comments      Exercises        Assessment/Plan    PT Assessment Patient needs continued PT services  PT Diagnosis Generalized weakness;Difficulty walking   PT Problem List Decreased strength;Decreased activity tolerance;Decreased balance;Decreased mobility;Decreased coordination;Decreased safety awareness;Decreased knowledge of use of DME  PT Treatment Interventions DME instruction;Balance training;Gait training;Functional mobility training;Patient/family education;Therapeutic activities;Therapeutic exercise;Wheelchair mobility training   PT Goals (Current goals can be found in the Care Plan section) Acute Rehab PT Goals Patient Stated Goal: To get stronger PT Goal Formulation: With patient Time For Goal Achievement: 06/28/16 Potential to Achieve Goals: Good    Frequency Min 4X/week   Barriers to discharge        Co-evaluation               End of Session Equipment Utilized During Treatment: Gait belt Activity Tolerance: Patient limited by fatigue Patient left: in bed;with call bell/phone within reach;with bed alarm set           Time: 1529-1600 PT Time Calculation (min) (ACUTE ONLY): 31 min   Charges:   PT Evaluation $PT Eval High Complexity: 1 Procedure PT Treatments $Therapeutic Activity: 8-22 mins   PT G CodesReginia Naas Jun 27, 2016, 4:50 PM  Magda Kiel, [place] 2016-06-27

## 2016-06-14 NOTE — Progress Notes (Signed)
2040: Critical vancomycin level 43, MD paged, awaiting call back. Will continue to monitor pt. Rosana Fret RN

## 2016-06-14 NOTE — Progress Notes (Addendum)
  [facility] Progress Note  VASCULAR SURGERY ASSESSMENT AND PLAN:   So far, the right transmetatarsal amputation site appears okay. 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 He has some persistent cellulitis and I would continue his intravenous antibiotics. He is on vancomycin and Zosyn.   I will begin physical therapy, partial weight-bearing right foot heel only with Darko shoe.   Begin daily dressing changes as ordered.  [name], MD, FACS Beeper [phone] Office: [phone]  Subjective  - POD #3  No complaints.   Objective Filed Vitals:   06/13/16 2051 06/14/16 0500  BP: 112/64 117/54  Pulse: 66 70  Temp: 97.6 F (36.4 C) 99.4 F (37.4 C)  Resp:      Intake/Output Summary (Last 24 hours) at 06/14/16 0830 Last data filed at 06/14/16 0500  Gross per 24 hour  Intake    910 ml  Output   1925 ml  Net  -1015 ml   Right foot swollen and erythematous distally . Staples intact with no drainage. Some dried blood on staple line. Foot is warm.   Assessment/Planning: 75 y.o. male is s/p: right transmetatarsal amputation. 3 Days Post-Op   Right foot appears viable. Continue daily dressing changes.   Alvia Grove 06/14/2016 8:30 AM --  Laboratory CBC    Component Value Date/Time   WBC 13.9* 06/14/2016 0523   HGB 10.1* 06/14/2016 0523   HCT 32.1* 06/14/2016 0523   PLT 276 06/14/2016 0523    BMET    Component Value Date/Time   NA 135 06/14/2016 0523   K 3.9 06/14/2016 0523   CL 101 06/14/2016 0523   CO2 23 06/14/2016 0523   GLUCOSE 128* 06/14/2016 0523   BUN 20 06/14/2016 0523   CREATININE 1.64* 06/14/2016 0523   CREATININE 1.35* 04/22/2016 1542   CALCIUM 8.6* 06/14/2016 0523   GFRNONAA 40* 06/14/2016 0523   GFRNONAA 52* 04/22/2016 1542   GFRAA 46* 06/14/2016 0523   GFRAA 60 04/22/2016 1542    COAG Lab Results  Component Value Date   INR 1.55* 06/09/2016   INR 1.36 06/05/2016   INR 1.29 06/04/2016   No results found for: PTT  Antibiotics Anti-infectives    Start     Dose/Rate Route Frequency Ordered Stop   06/11/16 0945  vancomycin (VANCOCIN) IVPB 1000 mg/200 mL premix     1,000 mg 200 mL/hr over 60 Minutes Intravenous Every 12 hours 06/11/16 0930     06/03/16 1400  piperacillin-tazobactam (ZOSYN) IVPB 3.375 g     3.375 g 12.5 mL/hr over 240 Minutes Intravenous Every 8 hours 06/03/16 1352     06/03/16 1400  vancomycin (VANCOCIN) IVPB 1000 mg/200 mL premix  Status:  Discontinued     1,000 mg 200 mL/hr over 60 Minutes Intravenous Every 12 hours 06/03/16 1352 06/11/16 0930       Virgina Jock, PA-C [facility] Office: [phone] Pager: [phone] 06/14/2016 8:30 AM   I have interviewed the patient and examined the patient. I agree with the findings by the PA.  Gae Gallop, MD [phone]

## 2016-06-14 NOTE — Progress Notes (Signed)
[facility]                                                                              Patient Demographics  Anthony Holder, is a 75 y.o. male, DOB - [birth year], [id number]  Admit date - 06/03/2016   Admitting Physician Albertine Patricia, MD  Outpatient Primary MD for the patient is [name], MD  Outpatient specialists:   Chief Complaint  Patient presents with  . Tachycardia  . Toe Pain      Brief summary   Patient is a 75 year old male with severe PVD,with prior left BKA and known ischemic ulcers right foot, chronic systolic CHF, AS with PAH, hypertension, history of CAD/ CABG 5 in 2010, DM, BPH, dyslipidemia and history of CVA. Patient recently underwent aortogram with right lower extremity runoff and may was found to have significant disease that was not amenable to intervention or surgery and medical therapy was opted for by the vascular surgeon. The past 2 days he noticed increased darkening of the pinky toe of the right foot with pain. He also noticed palpitations. He underwent an outpatient MRI on 6/14 that showed negative findings regarding abscess or osteomyelitis. During further workup patient underwent left heart cath showing multivessel disease and severe aortic stenosis along with severe systolic dysfunction with EF 25%, cardiology following, he also had evidence of right foot fifth toe dry gangrene with surrounding cellulitis and severe PAD. Vascular surgery following. Pt had right leg angioplasty on 06/09/2016, cardiology, vascular surgery following. Cardiology is consulted in cardiothoracic surgery for multivessel disease and severe AS.  Cardiothoracic surgery following for possible TAVR.  Likely would need to be done electively when he has recovered from vascular surgery and amputation.    Assessment & Plan   Elevated troponin/Hx of CABG x 5 2010/abnormal EKG, cardiomyopathy, palpitations - Patient without chest pain, troponin trended up  to 1.08, new ST segment downsloping and T-wave inversion on EKG. Cardiology consulted, per Dr. Percival Spanish, needs a right and left cardiac cath, Cardiology following. Currently chest pain-free, on aspirin, beta blocker for secondary prevention, has statin allergy. He underwent catheterization on 06/07/2016 showing multivessel disease, EF of 25% along with severe AS. Cardiology following and consulting cardiothoracic surgery for input.  Pt seemed to tolerated 1 unit PRBC on 6/22.   Aortic stenosis, moderate/Pulmonary hypertension - as above.   Mild acute on Chronic systolic congestive heart failure, NYHA class 1 EF 25%. With moderate to severe AS. Currently symptom-free, blood pressure is borderline, on beta blocker and Lasix, not on ACE inhibitor or ARB due to soft blood pressures and moderate to severe AS.  Continue diuretics per cardiology service.    PVD (peripheral vascular disease)/S/P BKA (below knee amputation) unilateral/ Ischemic ulcer of right foot with dry gangrene of the fifth toe and then around the first metatarsal head with some surrounding cellulitis  - blood cultures NGTD, no other source of infection, UA unremarkable, chest x-ray unimpressive, Continue IV vancomycin and Zosyn. Vascular surgery following, discussed his case with [name] on 06/08/2016, vascular surgery will attempt right leg angioplasty on 06/09/2016. For now no plans of amputation.  Pt had endovascular  procedures on 6/21 below:  #1 successful atherectomy and angioplasty of a occluded right peroneal artery using a CSI 1.25 device and a coyote 3 mm balloon. Residual stenosis was less than 10% #2 successful atherectomy and drug coated balloon angioplasty of the right superficial femoral and popliteal artery treating 90% stenosis. Residual stenosis was less than 10%. #3 2 non-flow-limiting dissections were seen in the popliteal superficial femoral artery.  Pt is post-op from right foot  transmetatarsal amputation 6/23 with vascular surgery. - see notes.  Bandage changes started 6/25.  Wound appears to be healing.  Will ask vascular surgery when we can stop the antibiotics.   Plan to continue zosyn and vancomycin for now.  Blood cultures no growth x 5 days.  Planning to switch to oral antibiotic 6/27.  PT/OT consultation re-ordered 6/26.       HTN (hypertension) -Blood pressure currently well-controlled on Tenormin  Hyperlipidemia -Continue omega-3 fatty acids  BPH (benign prostatic hypertrophy)-Monitor for acute urinary retention. Good urine output reported.   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  Hg improved, following.   FOBT, anemia panel inconclusive but most consistent with iron deficiency.  Will need to get him on iron supplement daily after transfusion.    CKD 3 - following creatinine. Had been stable. Following.   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  Continue monitoring.  See below.    Hypokalemia - repleted, will follow.   Lab Results  Component Value Date   HGBA1C 8.0* 06/03/2016   CBG (last 3)   Recent Labs  06/13/16 1705 06/13/16 2119 06/14/16 0740  GLUCAP 254* 231* 112*    Code Status: Full CODE STATUS DVT Prophylaxis:  Lovenox  Family Communication: Discussed with the patient and wife Daily at bedside  Disposition Plan: Stay inpatient  Procedures:   TTE - Left ventricle: The cavity size was normal. Wall thickness was normal. Systolic function was severely reduced. The estimated ejection fraction was in the range of 25% to 30%. Akinesis of the inferior myocardium. - Aortic valve: There was moderate to severe stenosis. Valve area (VTI): 0.66 cm^2. Valve area (Vmax): 0.62 cm^2. Valve area (Vmean): 0.58 cm^2. - Left atrium: The atrium  was mildly dilated. - Pulmonary arteries: Systolic pressure was moderately increased. PA peak pressure: 54 mm Hg (S).  MR R foot - no osteomyelitis or abscess.  L heart Cath 06-07-16 - multivessel CAD with severe aortic stenosis 6/23 - transmetarsal amputation  Consultants:    Cardiology Vascular surgery Cardiology Has consulted Dr. Lucianne Lei tright cardiothoracic surgery  Antibiotics :   Anti-infectives    Start     Dose/Rate Route Frequency Ordered Stop   06/11/16 0945  vancomycin (VANCOCIN) IVPB 1000 mg/200 mL premix     1,000 mg 200 mL/hr over 60 Minutes Intravenous Every 12 hours 06/11/16 0930     06/03/16 1400  piperacillin-tazobactam (ZOSYN) IVPB 3.375 g     3.375 g 12.5 mL/hr over 240 Minutes Intravenous Every 8 hours 06/03/16 1352     06/03/16 1400  vancomycin (VANCOCIN) IVPB 1000 mg/200 mL premix  Status:  Discontinued     1,000 mg 200 mL/hr over 60 Minutes Intravenous Every 12 hours 06/03/16 1352 06/11/16 0930     Medications  Scheduled Meds: . aspirin EC  81 mg Oral Daily  . baclofen  10 mg Oral Daily  . carvedilol  12.5 mg Oral BID WC  . docusate sodium  100 mg Oral Daily  . enoxaparin (LOVENOX) injection  40 mg Subcutaneous Q24H  . furosemide  40 mg Oral BID  . gabapentin  300 mg Oral TID  . insulin aspart  0-5 Units Subcutaneous QHS  . insulin aspart  0-9 Units Subcutaneous TID WC  . insulin aspart  3 Units Subcutaneous TID WC  . insulin NPH Human  20 Units Subcutaneous BID AC & HS  . pantoprazole  40 mg Oral Daily  . piperacillin-tazobactam (ZOSYN)  IV  3.375 g Intravenous Q8H  . potassium chloride SA  20 mEq Oral Daily  . vancomycin  1,000 mg Intravenous Q12H   Continuous Infusions: . lactated ringers 10 mL/hr at 06/11/16 1052   PRN Meds:.sodium chloride, acetaminophen **OR** acetaminophen, gi cocktail, guaiFENesin-dextromethorphan, hydrALAZINE, labetalol, magnesium sulfate 1 - 4 g bolus IVPB, metoprolol, morphine injection, ondansetron,  oxyCODONE-acetaminophen, phenol, potassium chloride, traMADol, zolpidem   Subjective:   Marquan Caraway  Pt resting comfortably, Pt starting to improve to his normal self and personality - this was noticed this morning.     Objective:   Filed Vitals:   06/13/16 0622 06/13/16 1700 06/13/16 2051 06/14/16 0500  BP: 110/61 119/61 112/64 117/54  Pulse: 73 73 66 70  Temp: 98.4 F (36.9 C) 99.6 F (37.6 C) 97.6 F (36.4 C) 99.4 F (37.4 C)  TempSrc: Oral Oral Oral Oral  Resp: 16 18    Height:      Weight:    184 lb 11.9 oz (83.8 kg)  SpO2: 90% 97% 94% 95%    Intake/Output Summary (Last 24 hours) at 06/14/16 1029 Last data filed at 06/14/16 0932  Gross per 24 hour  Intake   1030 ml  Output   1925 ml  Net   -895 ml    Wt Readings from Last 3 Encounters:  06/14/16 184 lb 11.9 oz (83.8 kg)  05/10/16 190 lb (86.183 kg)  05/06/16 196 lb (88.905 kg)   Exam  General: Alert and oriented x 3, NAD  HEENT:    Neck: Supple, no JVD, no masses  Cardiovascular: S1 S2 auscultated, no rubs, Loud aortic systolic murmur, Regular rate and rhythm.  Respiratory: Clear to auscultation bilaterally, no wheezing, rales or rhonchi  Gastrointestinal: Soft, nontender, nondistended, + bowel sounds  Ext: no cyanosis clubbing, left BKA, right foot wound clean and dry, dried blood seen, no s/s infection seen.   Neuro: no new deficit  Skin: bandages clean and dry  Psych: Normal affect and demeanor, alert and oriented x3    Data Reviewed:  I have personally reviewed following labs and imaging studies  Micro Results Recent Results (from the past 240 hour(s))  Urine culture     Status: None   Collection Time: 06/06/16 10:37 AM  Result Value Ref Range Status   Specimen Description URINE, RANDOM  Final   Special Requests NONE  Final   Culture NO GROWTH  Final   Report Status 06/07/2016 FINAL  Final  Culture, blood (Routine X 2) w Reflex to ID Panel     Status: None   Collection Time:  06/06/16  5:09 PM  Result Value Ref Range Status   Specimen Description BLOOD RIGHT HAND  Final   Special Requests BOTTLES DRAWN AEROBIC AND ANAEROBIC 5CC  Final   Culture NO GROWTH 5 DAYS  Final   Report Status 06/11/2016 FINAL  Final  Culture, blood (Routine  X 2) w Reflex to ID Panel     Status: None   Collection Time: 06/06/16  5:09 PM  Result Value Ref Range Status   Specimen Description BLOOD RIGHT HAND  Final   Special Requests IN PEDIATRIC BOTTLE 5CC  Final   Culture NO GROWTH 5 DAYS  Final   Report Status 06/11/2016 FINAL  Final  Surgical pcr screen     Status: None   Collection Time: 06/10/16 10:04 PM  Result Value Ref Range Status   MRSA, PCR NEGATIVE NEGATIVE Final   Staphylococcus aureus NEGATIVE NEGATIVE Final    Comment:        The Xpert SA Assay (FDA approved for NASAL specimens in patients over 37 years of age), is one component of a comprehensive surveillance program.  Test performance has been validated by [facility] for patients greater than or equal to 20 year old. It is not intended to diagnose infection nor to guide or monitor treatment.    Radiology Reports Dg Chest 2 View  06/05/2016  CLINICAL DATA:  Shortness breath EXAM: CHEST  2 VIEW COMPARISON:  06/03/2016 chest radiograph. FINDINGS: Sternotomy wires appear aligned and intact. CABG clips overlie the mediastinum. Partially visualized surgical hardware from ACDF overlying the lower cervical spine. Stable cardiomediastinal silhouette with mild cardiomegaly. No pneumothorax. Stable trace left pleural effusion. No right pleural effusion. No pulmonary edema. No acute consolidative airspace disease. Stable mild left basilar atelectasis. IMPRESSION: 1. Stable mild cardiomegaly without pulmonary edema. 2. Stable trace left pleural effusion and mild left basilar atelectasis. Electronically Signed   By: Ilona Sorrel M.D.   On: 06/05/2016 12:26   Dg Chest 2 View  06/03/2016  CLINICAL DATA:  Chest pain EXAM: CHEST   2 VIEW COMPARISON:  March 03, 2016 FINDINGS: There is mild scarring in the left base. Lungs elsewhere are clear. Heart is upper normal in size with pulmonary vascularity within normal limits. Patient is status post coronary artery bypass grafting. No adenopathy. There is postoperative change in the lower cervical spine region. IMPRESSION: Scarring left base. No edema or consolidation. Stable cardiac silhouette. Electronically Signed   By: Lowella Grip III M.D.   On: 06/03/2016 13:19   Mr Foot Right W Wo Contrast  06/02/2016  CLINICAL DATA:  Nonhealing wounds at the distal aspect of the right first metatarsal and between the fourth and fifth toes for 4-5 weeks. Question abscess or osteomyelitis. EXAM: MRI OF THE RIGHT FOREFOOT WITHOUT AND WITH CONTRAST TECHNIQUE: Multiplanar, multisequence MR imaging was performed both before and after administration of intravenous contrast. CONTRAST:  18 ml MULTIHANCE GADOBENATE DIMEGLUMINE 529 MG/ML IV SOLN COMPARISON:  Plain films of the right foot 05/24/2016. FINDINGS: There may be a skin wound along the head of the first metatarsal versus artifactual loss of visualization of cutaneous tissues. Artifact is favored. No abscess is identified. No bone marrow signal abnormality to suggest osteomyelitis is seen. Mild first MTP degenerative change is seen with some associated marrow edema in the medial sesamoid. Intrinsic musculature the foot demonstrates some fatty replacement with associated mildly increased T2 signal. IMPRESSION: Negative for abscess or osteomyelitis. Mild first MTP osteoarthritis. Electronically Signed   By: Inge Rise M.D.   On: 06/02/2016 16:03   Dg Foot Complete Right  05/24/2016  CLINICAL DATA:  Nonhealing wound in the first toe EXAM: RIGHT FOOT COMPLETE - 3+ VIEW COMPARISON:  None. FINDINGS: Mild degenerative changes of the first MTP joint are noted. No acute fracture or dislocation is seen. Mild  degenerative changes are noted in the tarsal  bones as well. No gross soft tissue abnormality is seen. No bony erosion to suggest osteomyelitis is noted. IMPRESSION: Degenerative change without acute bony abnormality. Electronically Signed   By: Inez Catalina M.D.   On: 05/24/2016 21:18   Lab Data:  CBC:  Recent Labs Lab 06/09/16 0430 06/10/16 0410 06/11/16 0425 06/12/16 0229 06/14/16 0523  WBC 12.4* 12.1* 11.9* 14.3* 13.9*  HGB 8.9* 8.0* 10.9* 9.9* 10.1*  HCT 27.8* 25.1* 33.2* 30.6* 32.1*  MCV 83.0 84.5 82.2 82.7 85.6  PLT 237 247 263 287 [id number]   Basic Metabolic Panel:  Recent Labs Lab 06/09/16 0430 06/10/16 0410 06/11/16 0425 06/12/16 0229 06/14/16 0523  NA 137 132* 135 134* 135  K 3.6 3.5 3.5 3.4* 3.9  CL 101 99* 103 101 101  CO2 26 25 25 25 23   GLUCOSE 151* 157* 135* 261* 128*  BUN 20 20 17 17 20   CREATININE 1.79* 1.70* 1.69* 1.76* 1.64*  CALCIUM 8.7* 8.1* 8.4* 8.3* 8.6*   GFR: Estimated Creatinine Clearance: 40.8 mL/min (by C-G formula based on Cr of 1.64). Liver Function Tests:  Recent Labs Lab 06/10/16 0410 06/12/16 0229  AST 30 18  ALT 21 26  ALKPHOS 51 51  BILITOT 0.5 0.8  PROT 5.8* 6.2*  ALBUMIN 2.0* 2.1*   No results for input(s): LIPASE, AMYLASE in the last 168 hours. No results for input(s): AMMONIA in the last 168 hours. Coagulation Profile:  Recent Labs Lab 06/09/16 0430  INR 1.55*   Cardiac Enzymes: No results for input(s): CKTOTAL, CKMB, CKMBINDEX, TROPONINI in the last 168 hours. BNP (last 3 results) No results for input(s): PROBNP in the last 8760 hours. HbA1C: No results for input(s): HGBA1C in the last 72 hours. CBG:  Recent Labs Lab 06/13/16 0743 06/13/16 1133 06/13/16 1705 06/13/16 2119 06/14/16 0740  GLUCAP 98 198* 254* 231* 112*   Lipid Profile: No results for input(s): CHOL, HDL, LDLCALC, TRIG, CHOLHDL, LDLDIRECT in the last 72 hours. Thyroid Function Tests: No results for input(s): TSH, T4TOTAL, FREET4, T3FREE, THYROIDAB in the last 72 hours. Anemia  Panel: No results for input(s): VITAMINB12, FOLATE, FERRITIN, TIBC, IRON, RETICCTPCT in the last 72 hours. Urine analysis:    Component Value Date/Time   COLORURINE YELLOW 06/06/2016 1037   APPEARANCEUR HAZY* 06/06/2016 1037   LABSPEC 1.022 06/06/2016 1037   PHURINE 5.5 06/06/2016 1037   GLUCOSEU 100* 06/06/2016 1037   HGBUR SMALL* 06/06/2016 1037   BILIRUBINUR NEGATIVE 06/06/2016 1037   KETONESUR NEGATIVE 06/06/2016 1037   PROTEINUR 100* 06/06/2016 1037   UROBILINOGEN 1.0 06/13/2015 0957   NITRITE NEGATIVE 06/06/2016 1037   LEUKOCYTESUR NEGATIVE 06/06/2016 1037   Signature  Clanford Johnson M.D on 06/14/2016 at 10:29 AM  Between 7am to 7pm - Pager - [phone], After 7pm go to [email or web address] - password [facility]  [facility]  - Office  [phone]

## 2016-06-14 NOTE — Progress Notes (Signed)
Inpatient Diabetes Program Recommendations  AACE/ADA: New Consensus Statement on Inpatient Glycemic Control (2015)  Target Ranges:  Prepandial:   less than 140 mg/dL      Peak postprandial:   less than 180 mg/dL (1-2 hours)      Critically ill patients:  140 - 180 mg/dL   Results for SHED, LIPP (MRN [id number]) as of 06/14/2016 07:49  Ref. Range 06/13/2016 07:43 06/13/2016 11:33 06/13/2016 17:05 06/13/2016 21:19  Glucose-Capillary Latest Ref Range: 65-99 mg/dL 98 198 (H) 254 (H) 231 (H)    Home DM Meds: NPH Insulin- 30 units AM/ 15 units PM       Metformin 1000 mg bid  Current Insulin Orders: NPH Insulin- 20 units bid (hold if CBG <120 mg/dl or if patient NPO)      Novolog Sensitive Correction Scale/ SSI (0-9 units) TID AC + HS      Novolog 3 units tidwc     MD- Patient's glucose levels likely elevated yesterday b/c patient did not get AM dose of NPH insulin.  There are hold parameters on current NPH dose (hold if CBG <120 mg/dl and hold if patient NPO). CBG yesterday AM was 98 mg/dl so NPH was held.  Please consider adjusting hold parameters on current NPH doses     --Will follow patient during hospitalization--  Wyn Quaker RN, MSN, CDE Diabetes Coordinator Inpatient Glycemic Control Team Team Pager: [phone] (8a-5p)

## 2016-06-14 NOTE — Care Management Important Message (Signed)
Important Message  Patient Details  Name: Anthony Holder MRN: [id number] Date of Birth: December 07, 1941   Medicare Important Message Given:  Yes    Nathen May 06/14/2016, 10:45 AM

## 2016-06-15 ENCOUNTER — Ambulatory Visit: Payer: Medicare Other | Admitting: Vascular Surgery

## 2016-06-15 DIAGNOSIS — E782 Mixed hyperlipidemia: Secondary | ICD-10-CM

## 2016-06-15 LAB — GLUCOSE, CAPILLARY
GLUCOSE-CAPILLARY: 223 mg/dL — AB (ref 65–99)
GLUCOSE-CAPILLARY: 187 mg/dL — AB (ref 65–99)
Glucose-Capillary: 153 mg/dL — ABNORMAL HIGH (ref 65–99)
Glucose-Capillary: 199 mg/dL — ABNORMAL HIGH (ref 65–99)

## 2016-06-15 MED ORDER — BACLOFEN 10 MG PO TABS
10.0000 mg | ORAL_TABLET | Freq: Every day | ORAL | Status: DC
  Administered 2016-06-15 – 2016-06-16 (×2): 10 mg via ORAL
  Filled 2016-06-15 (×2): qty 1

## 2016-06-15 MED ORDER — DOXYCYCLINE HYCLATE 100 MG PO TABS
100.0000 mg | ORAL_TABLET | Freq: Two times a day (BID) | ORAL | Status: DC
  Administered 2016-06-16 – 2016-06-17 (×3): 100 mg via ORAL
  Filled 2016-06-15 (×3): qty 1

## 2016-06-15 NOTE — Progress Notes (Signed)
Noted pt with poor ability to participate with therapy today. Likely will need SNF rehab based on OT assessment today. I have updated RN CM with change in rehab venue recommendations. [phone]

## 2016-06-15 NOTE — Progress Notes (Signed)
Inpatient Diabetes Program Recommendations  AACE/ADA: New Consensus Statement on Inpatient Glycemic Control (2015)  Target Ranges:  Prepandial:   less than 140 mg/dL      Peak postprandial:   less than 180 mg/dL (1-2 hours)      Critically ill patients:  140 - 180 mg/dL   Results for Anthony Holder, Anthony Holder (MRN [id number]) as of 06/15/2016 09:04  Ref. Range 06/13/2016 07:43 06/13/2016 11:33 06/13/2016 17:05 06/13/2016 21:19  Glucose-Capillary Latest Ref Range: 65-99 mg/dL 98 198 (H) 254 (H) 231 (H)   Results for Anthony Holder, Anthony Holder (MRN [id number]) as of 06/15/2016 09:04  Ref. Range 06/14/2016 07:40 06/14/2016 11:50 06/14/2016 16:33 06/14/2016 20:57  Glucose-Capillary Latest Ref Range: 65-99 mg/dL 112 (H) 179 (H) 216 (H) 212 (H)    Home DM Meds: NPH Insulin- 30 units AM/ 15 units PM  Metformin 1000 mg bid  Current Insulin Orders: NPH Insulin- 20 units bid (hold if CBG <120 mg/dl or if patient NPO)  Novolog Sensitive Correction Scale/ SSI (0-9 units) TID AC + HS  Novolog 3 units tidwc     MD- Patient's glucose levels likely elevated yesterday and the day prior b/c patient did not get AM dose of NPH insulin. There are hold parameters on current NPH dose (hold if CBG <120 mg/dl and hold if patient NPO).   CBG yesterday AM was 112 mg/dl so NPH was held.  Please consider adjusting hold parameters on current NPH doses     --Will follow patient during hospitalization--  Wyn Quaker RN, MSN, CDE Diabetes Coordinator Inpatient Glycemic Control Team Team Pager: [phone] (8a-5p)

## 2016-06-15 NOTE — Progress Notes (Signed)
Occupational Therapy Evaluation Patient Details Name: Anthony Holder MRN: [id number] DOB: [birth year] Today's Date: 06/15/2016    History of Present Illness 75 yo male w/ hx CABG x 5 with RCEA in 2010, DM w/ neuropathy, CRI-2, s/p Lt BKA 2013 2nd chronic DM foot infection. Admitted 06/15 w/ dry gangrene R foot, cards seeing for CHF/CAD, R/L heart cath 06/19, R transmet amputation 6/23.   Clinical Impression   PTA, pt was independent with ADLs and used [facility] for mobility up until a few weeks ago. Pt was very deconditioned and lethargic during session and was unable to assist much. Pt currently requires max-total +2 assist for all ADLs and anterior-posterior transfer to chair as pt is now NWB on RLE. Recommend CIR as pt is motivated to regain independence and be able to ambulate again. Will continue to follow acutely.    Follow Up Recommendations  CIR;Supervision/Assistance - 24 hour    Equipment Recommendations  Other (comment) (TBD at next venue)    Recommendations for Other Services       Precautions / Restrictions Precautions Precautions: Fall Precaution Comments: wears prosthesis on LLE Required Braces or Orthoses: Other Brace/Splint Other Brace/Splint: R heel wedge darco Restrictions Weight Bearing Restrictions: Yes RLE Weight Bearing: Non weight bearing      Mobility Bed Mobility Overal bed mobility: Needs Assistance Bed Mobility: Supine to Sit     Supine to sit: Max assist;+2 for physical assistance;HOB elevated     General bed mobility comments: HOB elevated to chair position, max +2 assist for trunk support, to pivot hips and complete anterior-posterior transfer into chair. Pt very deconditioned and lethargic making it difficult for him to assist.  Transfers Overall transfer level: Needs assistance Equipment used: None Transfers: Comptroller transfers: Total assist;+2 physical assistance   General transfer comment:  Total +2 assist for transfer into chair. VCs for hand placement and safety.    Balance Overall balance assessment: Needs assistance Sitting-balance support: No upper extremity supported;Feet supported Sitting balance-Leahy Scale: Poor Sitting balance - Comments: Posterior lean with mod assist to maintain safe upright position Postural control: Posterior lean                                  ADL Overall ADL's : Needs assistance/impaired         Upper Body Bathing: Moderate assistance;Sitting   Lower Body Bathing: Total assistance;Sitting/lateral leans   Upper Body Dressing : Moderate assistance;Sitting   Lower Body Dressing: Total assistance;Sitting/lateral leans   Toilet Transfer: Total assistance;+2 for physical assistance;Anterior/posterior   Toileting- Water quality scientist and Hygiene: Total assistance;Sitting/lateral lean       Functional mobility during ADLs: Maximal assistance;+2 for physical assistance General ADL Comments: Pt very deconditioned and lethargic and unable to assist much with transfers.      Vision Vision Assessment?: No apparent visual deficits   Perception     Praxis      Pertinent Vitals/Pain Pain Assessment: Faces Faces Pain Scale: Hurts even more Pain Location: RLE Pain Descriptors / Indicators: Sore Pain Intervention(s): Limited activity within patient's tolerance;Repositioned;Premedicated before session;Monitored during session     Hand Dominance Right   Extremity/Trunk Assessment Upper Extremity Assessment Upper Extremity Assessment: Generalized weakness RUE Deficits / Details: STRENGTH: 2+/5 overall; AROM: wfl; SENSATION: intact LUE Deficits / Details: STRENGTH: 2+/5 overall, gross AROM WFL, has jerking with losing grip and reports was unable to use cell  phone to call wife due to dropping it (mycolonic jerks)   Lower Extremity Assessment Lower Extremity Assessment: RLE deficits/detail;LLE deficits/detail RLE  Deficits / Details: transmet amputation with dressing on foot, lifts leg antigravity, decreased functional strength for transfers or for  LLE Deficits / Details: BKA LLE wears prosthesis       Communication Communication Communication: HOH   Cognition Arousal/Alertness: Lethargic;Suspect due to medications Behavior During Therapy: Flat affect Overall Cognitive Status: Within Functional Limits for tasks assessed                     General Comments       Exercises       Shoulder Instructions      Home Living Family/patient expects to be discharged to:: Private residence Living Arrangements: Spouse/significant other Available Help at Discharge: Family;Available 24 hours/day Type of Home: Mobile home Home Access: Ramped entrance     Home Layout: One level     Bathroom Shower/Tub: Occupational psychologist: Standard Bathroom Accessibility: Yes   Home Equipment: Environmental consultant - 2 wheels;Shower seat;Wheelchair - manual;Cane - single point          Prior Functioning/Environment Level of Independence: Independent with assistive device(s);Independent        Comments: pt wife reports up until a few wks ago pt ws I with ADLS and mod I with SPC for amb    OT Diagnosis: Generalized weakness;Cognitive deficits;Acute pain   OT Problem List: Decreased strength;Decreased range of motion;Decreased activity tolerance;Impaired balance (sitting and/or standing);Decreased coordination;Decreased cognition;Decreased safety awareness;Decreased knowledge of precautions;Decreased knowledge of use of DME or AE;Pain   OT Treatment/Interventions: Self-care/ADL training;Therapeutic exercise;Energy conservation;DME and/or AE instruction;Therapeutic activities;Patient/family education;Balance training    OT Goals(Current goals can be found in the care plan section) Acute Rehab OT Goals Patient Stated Goal: to get stronger and be able to go home after rehab OT Goal Formulation: With  patient Time For Goal Achievement: 06/29/16 Potential to Achieve Goals: Good ADL Goals Pt Will Perform Grooming: with modified independence;sitting Pt Will Perform Upper Body Dressing: with modified independence;sitting Pt Will Perform Lower Body Dressing: with min assist;sitting/lateral leans (including donning/doffing L prosthetic) Pt Will Transfer to Toilet: with mod assist;anterior/posterior transfer;bedside commode Pt Will Perform Toileting - Clothing Manipulation and hygiene: with supervision;sitting/lateral leans Pt/caregiver will Perform Home Exercise Program: Increased strength;Both right and left upper extremity;Independently;With written HEP provided  OT Frequency: Min 2X/week   Barriers to D/C:            Co-evaluation              End of Session Nurse Communication: Mobility status  Activity Tolerance: Patient limited by lethargy;Patient limited by fatigue;Patient limited by pain Patient left: in chair;with call bell/phone within reach;with family/visitor present   Time: 1142-1205 OT Time Calculation (min): 23 min Charges:  OT General Charges $OT Visit: 1 Procedure OT Evaluation $OT Eval Moderate Complexity: 1 Procedure OT Treatments $Self Care/Home Management : 8-22 mins G-Codes:    Redmond Baseman, OTR/L Pager: [phone] 06/15/2016, 1:44 PM

## 2016-06-15 NOTE — Progress Notes (Signed)
Dressing supplies placed at bedside yesterday evening per order. Dressing this am was changed by [name] with RN at the bedside.

## 2016-06-15 NOTE — Progress Notes (Signed)
[facility]                                                                              Patient Demographics  Anthony Holder, is a 75 y.o. male, DOB - 07-20-1941, [id number]  Admit date - 06/03/2016   Admitting Physician Starleen Arms, MD  Outpatient Primary MD for the patient is [name], MD  Outpatient specialists:   Chief Complaint  Patient presents with  . Tachycardia  . Toe Pain      Brief summary   Patient is a 75 year old male with severe PVD,with prior left BKA and known ischemic ulcers right foot, chronic systolic CHF, AS with PAH, hypertension, history of CAD/ CABG 5 in 2010, DM, BPH, dyslipidemia and history of CVA. Patient recently underwent aortogram with right lower extremity runoff and may was found to have significant disease that was not amenable to intervention or surgery and medical therapy was opted for by the vascular surgeon. The past 2 days he noticed increased darkening of the pinky toe of the right foot with pain. He also noticed palpitations. He underwent an outpatient MRI on 6/14 that showed negative findings regarding abscess or osteomyelitis. During further workup patient underwent left heart cath showing multivessel disease and severe aortic stenosis along with severe systolic dysfunction with EF 25%, cardiology following, he also had evidence of right foot fifth toe dry gangrene with surrounding cellulitis and severe PAD. Vascular surgery following. Pt had right leg angioplasty on 06/09/2016, cardiology, vascular surgery following. Cardiology is consulted in cardiothoracic surgery for multivessel disease and severe AS.  Cardiothoracic surgery following for possible TAVR.  Likely would need to be done electively when he has recovered from recent vascular surgery and amputation.    Assessment & Plan   Elevated troponin/Hx of CABG x 5 2010/abnormal EKG, cardiomyopathy, palpitations - Patient without chest pain, troponin  trended up to 1.08, new ST segment downsloping and T-wave inversion on EKG. Cardiology consulted, per Dr. Antoine Poche, needs a right and left cardiac cath, Cardiology following. Currently chest pain-free, on aspirin, beta blocker for secondary prevention, has statin allergy. He underwent catheterization on 06/07/2016 showing multivessel disease, EF of 25% along with severe AS. Cardiology following and consulting cardiothoracic surgery for input.  Pt seemed to tolerated 1 unit PRBC on 6/22.   Aortic stenosis, moderate/Pulmonary hypertension - as above. Cardiothoracic surgery following for possible TAVR.  Likely would need to be done electively when he has recovered from recent vascular surgery and amputation.   Mild acute on Chronic systolic congestive heart failure, NYHA class 1 EF 25%. With moderate to severe AS. Currently symptom-free, blood pressure is borderline, on beta blocker and Lasix, not on ACE inhibitor or ARB due to soft blood pressures and moderate to severe AS.  Continue diuretics per cardiology service.    PVD (peripheral vascular disease)/S/P BKA (below knee amputation) unilateral/ Ischemic ulcer of right foot with dry gangrene of the fifth toe and then around the first metatarsal head with some surrounding cellulitis  - blood cultures NGTD, no other source of infection, UA unremarkable, chest x-ray unimpressive, Continue IV vancomycin and Zosyn. Vascular surgery following, discussed his  case with Dr. Arbie Cookey on 06/08/2016, vascular surgery will attempt right leg angioplasty on 06/09/2016. For now no plans of amputation.  Pt had endovascular procedures on 6/21 below:  #1 successful atherectomy and angioplasty of a occluded right peroneal artery using a CSI 1.25 device and a coyote 3 mm balloon. Residual stenosis was less than 10% #2 successful atherectomy and drug coated balloon angioplasty of the right superficial femoral and popliteal artery treating 90% stenosis. Residual  stenosis was less than 10%. #3 2 non-flow-limiting dissections were seen in the popliteal superficial femoral artery.  Pt is post-op from right foot transmetatarsal amputation 6/23 with vascular surgery. - see notes.  Bandage changes started 6/25.  Wound appears to be healing.   Discontinued zosyn and vancomycin 6/27.   Blood cultures no growth x 5 days.  Switch to oral antibiotic 6/28.          HTN (hypertension) -Blood pressure currently well-controlled on Tenormin  Hyperlipidemia -Continue omega-3 fatty acids  BPH (benign prostatic hypertrophy)- Has foley in place now. Would d/c foley in next 1-2 days.  He will need voiding trial with his history of acute urinary retention.   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  Hg improved, following.   FOBT, anemia panel inconclusive but most consistent with iron deficiency.  Will need to get him on iron supplement at discharge.    CKD 3 - following creatinine. Had been stable. Following.   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  Will need to monitor closely as he has not been eating as well but improving consumption daily.  Continue monitoring.  See below.    Hypokalemia - repleted, will follow.   Lab Results  Component Value Date   HGBA1C 8.0* 06/03/2016   CBG (last 3)   Recent Labs  06/14/16 1633 06/14/16 2057 06/15/16 0741  GLUCAP 216* 212* 153*    Code Status: Full CODE STATUS DVT Prophylaxis:  Lovenox  Family Communication: Discussed with the patient and wife Daily at bedside  Disposition Plan: Stay inpatient  Procedures:   TTE - Left ventricle: The cavity size was normal. Wall thickness was normal. Systolic function was severely reduced. The estimated ejection fraction was in the range of 25% to 30%.  Akinesis of the inferior myocardium. - Aortic valve: There was moderate to severe stenosis. Valve area (VTI): 0.66 cm^2. Valve area (Vmax): 0.62 cm^2. Valve area (Vmean): 0.58 cm^2. - Left atrium: The atrium was mildly dilated. - Pulmonary arteries: Systolic pressure was moderately increased. PA peak pressure: 54 mm Hg (S).  MR R foot - no osteomyelitis or abscess.  L heart Cath 06-07-16 - multivessel CAD with severe aortic stenosis 6/23 - transmetarsal amputation  Consultants:    Cardiology Vascular surgery Cardiology Has consulted Dr. Zenaida Niece tright cardiothoracic surgery  Antibiotics :   Anti-infectives    Start     Dose/Rate Route Frequency Ordered Stop   06/16/16 1000  doxycycline (VIBRA-TABS) tablet 100 mg     100 mg Oral Every 12 hours 06/15/16 0949     06/11/16 0945  vancomycin (VANCOCIN) IVPB 1000 mg/200 mL premix  Status:  Discontinued     1,000 mg 200 mL/hr over 60 Minutes Intravenous Every 12 hours 06/11/16 0930 06/14/16 2158   06/03/16 1400  piperacillin-tazobactam (ZOSYN) IVPB 3.375 g  Status:  Discontinued     3.375 g 12.5 mL/hr over 240 Minutes Intravenous Every 8 hours 06/03/16 1352 06/15/16 0820   06/03/16 1400  vancomycin (VANCOCIN) IVPB 1000 mg/200 mL premix  Status:  Discontinued     1,000 mg 200 mL/hr over 60 Minutes Intravenous Every 12 hours 06/03/16 1352 06/11/16 0930     Medications  Scheduled Meds: . aspirin EC  81 mg Oral Daily  . baclofen  10 mg Oral Daily  . carvedilol  12.5 mg Oral BID WC  . docusate sodium  100 mg Oral Daily  . [START ON 06/16/2016] doxycycline  100 mg Oral Q12H  . enoxaparin (LOVENOX) injection  40 mg Subcutaneous Q24H  . furosemide  40 mg Oral BID  . gabapentin  300 mg Oral TID  . insulin aspart  0-5 Units Subcutaneous QHS  . insulin aspart  0-9 Units Subcutaneous TID WC  . insulin aspart  3 Units Subcutaneous TID WC  . insulin NPH Human  20 Units Subcutaneous BID AC & HS  . pantoprazole  40 mg Oral Daily  . potassium  chloride SA  20 mEq Oral Daily   Continuous Infusions: . lactated ringers 10 mL/hr at 06/11/16 1052   PRN Meds:.sodium chloride, acetaminophen **OR** acetaminophen, gi cocktail, guaiFENesin-dextromethorphan, hydrALAZINE, labetalol, magnesium sulfate 1 - 4 g bolus IVPB, metoprolol, morphine injection, ondansetron, oxyCODONE-acetaminophen, phenol, potassium chloride, traMADol, zolpidem   Subjective:   Hassel Rathman  Pt reported a lot of pain last night to healing right foot.      Objective:   Filed Vitals:   06/14/16 0500 06/14/16 1326 06/14/16 2100 06/15/16 0533  BP: 117/54 109/55 130/65 122/64  Pulse: 70 72 69 64  Temp: 99.4 F (37.4 C) 98.5 F (36.9 C) 99 F (37.2 C) 97.5 F (36.4 C)  TempSrc: Oral Oral Oral Oral  Resp:  20  18  Height:      Weight: 184 lb 11.9 oz (83.8 kg)   186 lb 8.2 oz (84.6 kg)  SpO2: 95% 95% 96% 91%    Intake/Output Summary (Last 24 hours) at 06/15/16 0952 Last data filed at 06/15/16 0700  Gross per 24 hour  Intake 471.25 ml  Output   3350 ml  Net -2878.75 ml    Wt Readings from Last 3 Encounters:  06/15/16 186 lb 8.2 oz (84.6 kg)  05/10/16 190 lb (86.183 kg)  05/06/16 196 lb (88.905 kg)   Exam  General: Alert and oriented x 3, NAD  HEENT:  NCAT  Neck: Supple, no JVD, no masses  Cardiovascular: S1 S2 auscultated, no rubs, Loud aortic systolic murmur, Regular rate and rhythm.  Respiratory: Clear to auscultation bilaterally, no wheezing, rales or rhonchi  Gastrointestinal: Soft, nontender, nondistended, + bowel sounds  Ext: no cyanosis clubbing, left BKA, right foot wound clean and dry.   Neuro: no new deficit  Skin: bandages clean and dry  Psych: Normal affect and demeanor, alert and oriented x3    Data Reviewed:  I have personally reviewed following labs and imaging studies  Micro Results Recent Results (from the past 240 hour(s))  Urine culture     Status: None   Collection Time: 06/06/16 10:37 AM  Result Value Ref  Range Status   Specimen Description URINE, RANDOM  Final   Special Requests NONE  Final   Culture NO GROWTH  Final   Report Status 06/07/2016 FINAL  Final  Culture, blood (Routine X 2) w Reflex to ID Panel     Status: None  Collection Time: 06/06/16  5:09 PM  Result Value Ref Range Status   Specimen Description BLOOD RIGHT HAND  Final   Special Requests BOTTLES DRAWN AEROBIC AND ANAEROBIC 5CC  Final   Culture NO GROWTH 5 DAYS  Final   Report Status 06/11/2016 FINAL  Final  Culture, blood (Routine X 2) w Reflex to ID Panel     Status: None   Collection Time: 06/06/16  5:09 PM  Result Value Ref Range Status   Specimen Description BLOOD RIGHT HAND  Final   Special Requests IN PEDIATRIC BOTTLE 5CC  Final   Culture NO GROWTH 5 DAYS  Final   Report Status 06/11/2016 FINAL  Final  Surgical pcr screen     Status: None   Collection Time: 06/10/16 10:04 PM  Result Value Ref Range Status   MRSA, PCR NEGATIVE NEGATIVE Final   Staphylococcus aureus NEGATIVE NEGATIVE Final    Comment:        The Xpert SA Assay (FDA approved for NASAL specimens in patients over 74 years of age), is one component of a comprehensive surveillance program.  Test performance has been validated by [facility] for patients greater than or equal to 56 year old. It is not intended to diagnose infection nor to guide or monitor treatment.    Radiology Reports Dg Chest 2 View  06/05/2016  CLINICAL DATA:  Shortness breath EXAM: CHEST  2 VIEW COMPARISON:  06/03/2016 chest radiograph. FINDINGS: Sternotomy wires appear aligned and intact. CABG clips overlie the mediastinum. Partially visualized surgical hardware from ACDF overlying the lower cervical spine. Stable cardiomediastinal silhouette with mild cardiomegaly. No pneumothorax. Stable trace left pleural effusion. No right pleural effusion. No pulmonary edema. No acute consolidative airspace disease. Stable mild left basilar atelectasis. IMPRESSION: 1. Stable mild  cardiomegaly without pulmonary edema. 2. Stable trace left pleural effusion and mild left basilar atelectasis. Electronically Signed   By: Delbert Phenix M.D.   On: 06/05/2016 12:26   Dg Chest 2 View  06/03/2016  CLINICAL DATA:  Chest pain EXAM: CHEST  2 VIEW COMPARISON:  March 03, 2016 FINDINGS: There is mild scarring in the left base. Lungs elsewhere are clear. Heart is upper normal in size with pulmonary vascularity within normal limits. Patient is status post coronary artery bypass grafting. No adenopathy. There is postoperative change in the lower cervical spine region. IMPRESSION: Scarring left base. No edema or consolidation. Stable cardiac silhouette. Electronically Signed   By: Bretta Bang III M.D.   On: 06/03/2016 13:19   Mr Foot Right W Wo Contrast  06/02/2016  CLINICAL DATA:  Nonhealing wounds at the distal aspect of the right first metatarsal and between the fourth and fifth toes for 4-5 weeks. Question abscess or osteomyelitis. EXAM: MRI OF THE RIGHT FOREFOOT WITHOUT AND WITH CONTRAST TECHNIQUE: Multiplanar, multisequence MR imaging was performed both before and after administration of intravenous contrast. CONTRAST:  18 ml MULTIHANCE GADOBENATE DIMEGLUMINE 529 MG/ML IV SOLN COMPARISON:  Plain films of the right foot 05/24/2016. FINDINGS: There may be a skin wound along the head of the first metatarsal versus artifactual loss of visualization of cutaneous tissues. Artifact is favored. No abscess is identified. No bone marrow signal abnormality to suggest osteomyelitis is seen. Mild first MTP degenerative change is seen with some associated marrow edema in the medial sesamoid. Intrinsic musculature the foot demonstrates some fatty replacement with associated mildly increased T2 signal. IMPRESSION: Negative for abscess or osteomyelitis. Mild first MTP osteoarthritis. Electronically Signed   By: Maisie Fus  Dalessio M.D.   On: 06/02/2016 16:03   Dg Foot Complete Right  05/24/2016  CLINICAL DATA:   Nonhealing wound in the first toe EXAM: RIGHT FOOT COMPLETE - 3+ VIEW COMPARISON:  None. FINDINGS: Mild degenerative changes of the first MTP joint are noted. No acute fracture or dislocation is seen. Mild degenerative changes are noted in the tarsal bones as well. No gross soft tissue abnormality is seen. No bony erosion to suggest osteomyelitis is noted. IMPRESSION: Degenerative change without acute bony abnormality. Electronically Signed   By: Alcide Clever M.D.   On: 05/24/2016 21:18   Lab Data:  CBC:  Recent Labs Lab 06/09/16 0430 06/10/16 0410 06/11/16 0425 06/12/16 0229 06/14/16 0523  WBC 12.4* 12.1* 11.9* 14.3* 13.9*  HGB 8.9* 8.0* 10.9* 9.9* 10.1*  HCT 27.8* 25.1* 33.2* 30.6* 32.1*  MCV 83.0 84.5 82.2 82.7 85.6  PLT 237 247 263 287 276   Basic Metabolic Panel:  Recent Labs Lab 06/09/16 0430 06/10/16 0410 06/11/16 0425 06/12/16 0229 06/14/16 0523  NA 137 132* 135 134* 135  K 3.6 3.5 3.5 3.4* 3.9  CL 101 99* 103 101 101  CO2 26 25 25 25 23   GLUCOSE 151* 157* 135* 261* 128*  BUN 20 20 17 17 20   CREATININE 1.79* 1.70* 1.69* 1.76* 1.64*  CALCIUM 8.7* 8.1* 8.4* 8.3* 8.6*   GFR: Estimated Creatinine Clearance: 40.8 mL/min (by C-G formula based on Cr of 1.64). Liver Function Tests:  Recent Labs Lab 06/10/16 0410 06/12/16 0229  AST 30 18  ALT 21 26  ALKPHOS 51 51  BILITOT 0.5 0.8  PROT 5.8* 6.2*  ALBUMIN 2.0* 2.1*   No results for input(s): LIPASE, AMYLASE in the last 168 hours. No results for input(s): AMMONIA in the last 168 hours. Coagulation Profile:  Recent Labs Lab 06/09/16 0430  INR 1.55*   Cardiac Enzymes: No results for input(s): CKTOTAL, CKMB, CKMBINDEX, TROPONINI in the last 168 hours. BNP (last 3 results) No results for input(s): PROBNP in the last 8760 hours. HbA1C: No results for input(s): HGBA1C in the last 72 hours. CBG:  Recent Labs Lab 06/14/16 0740 06/14/16 1150 06/14/16 1633 06/14/16 2057 06/15/16 0741  GLUCAP 112* 179*  216* 212* 153*   Lipid Profile: No results for input(s): CHOL, HDL, LDLCALC, TRIG, CHOLHDL, LDLDIRECT in the last 72 hours. Thyroid Function Tests: No results for input(s): TSH, T4TOTAL, FREET4, T3FREE, THYROIDAB in the last 72 hours. Anemia Panel: No results for input(s): VITAMINB12, FOLATE, FERRITIN, TIBC, IRON, RETICCTPCT in the last 72 hours. Urine analysis:    Component Value Date/Time   COLORURINE YELLOW 06/06/2016 1037   APPEARANCEUR HAZY* 06/06/2016 1037   LABSPEC 1.022 06/06/2016 1037   PHURINE 5.5 06/06/2016 1037   GLUCOSEU 100* 06/06/2016 1037   HGBUR SMALL* 06/06/2016 1037   BILIRUBINUR NEGATIVE 06/06/2016 1037   KETONESUR NEGATIVE 06/06/2016 1037   PROTEINUR 100* 06/06/2016 1037   UROBILINOGEN 1.0 06/13/2015 0957   NITRITE NEGATIVE 06/06/2016 1037   LEUKOCYTESUR NEGATIVE 06/06/2016 1037   Signature  Terralyn Matsumura M.D on 06/15/2016 at 9:52 AM  Between 7am to 7pm - Pager - [phone], After 7pm go to [email or web address] - password [facility]  [facility]  - Office  [phone]

## 2016-06-15 NOTE — Progress Notes (Signed)
[facility]  Subjective  - Doing well over all.  Dressing was changed by nursing staff this am.   Objective 122/64 64 97.5 F (36.4 C) (Oral) 18 91%  Intake/Output Summary (Last 24 hours) at 06/15/16 0827 Last data filed at 06/15/16 0700  Gross per 24 hour  Intake 711.25 ml  Output   3350 ml  Net -2638.75 ml    Doppler peroneal and PT signals on left LE Dressing clean and dry  Assessment/Planning: POD # 4 right transmetatarsal amputation.  Doing well will plan on dressing change tomorrow morning for wound inspection. Continue IV antibiotics for cellulitis  [facility] 06/15/2016 8:27 AM --  Laboratory Lab Results:  Recent Labs  06/14/16 0523  WBC 13.9*  HGB 10.1*  HCT 32.1*  PLT 276   BMET  Recent Labs  06/14/16 0523  NA 135  K 3.9  CL 101  CO2 23  GLUCOSE 128*  BUN 20  CREATININE 1.64*  CALCIUM 8.6*    COAG Lab Results  Component Value Date   INR 1.55* 06/09/2016   INR 1.36 06/05/2016   INR 1.29 06/04/2016   No results found for: PTT

## 2016-06-15 NOTE — Progress Notes (Signed)
Subjective: Interval History: none.. Patient groggy this morning. Wife reports a great deal of pain last night in his foot slowing his ability to sleep   Objective: Vital signs in last 24 hours: Temp:  [97.5 F (36.4 C)-99 F (37.2 C)] 97.5 F (36.4 C) (06/27 0533) Pulse Rate:  [64-72] 64 (06/27 0533) Resp:  [18-20] 18 (06/27 0533) BP: (109-130)/(55-65) 122/64 mmHg (06/27 0533) SpO2:  [91 %-96 %] 91 % (06/27 0533) Weight:  [186 lb 8.2 oz (84.6 kg)] 186 lb 8.2 oz (84.6 kg) (06/27 0533)  Intake/Output from previous day: 06/26 0701 - 06/27 0700 In: 711.3 [P.O.:440; IV Piggyback:271.3] Out: 3350 [Urine:3350] Intake/Output this shift:    Remains afebrile. Dressing removed. Does have some erythema on his distal amputation site and also one area over the lateral aspect with some duskiness. No significant change from yesterday.  Lab Results:  Recent Labs  06/14/16 0523  WBC 13.9*  HGB 10.1*  HCT 32.1*  PLT 276   BMET  Recent Labs  06/14/16 0523  NA 135  K 3.9  CL 101  CO2 23  GLUCOSE 128*  BUN 20  CREATININE 1.64*  CALCIUM 8.6*    Studies/Results: Dg Chest 2 View  06/05/2016  CLINICAL DATA:  Shortness breath EXAM: CHEST  2 VIEW COMPARISON:  06/03/2016 chest radiograph. FINDINGS: Sternotomy wires appear aligned and intact. CABG clips overlie the mediastinum. Partially visualized surgical hardware from ACDF overlying the lower cervical spine. Stable cardiomediastinal silhouette with mild cardiomegaly. No pneumothorax. Stable trace left pleural effusion. No right pleural effusion. No pulmonary edema. No acute consolidative airspace disease. Stable mild left basilar atelectasis. IMPRESSION: 1. Stable mild cardiomegaly without pulmonary edema. 2. Stable trace left pleural effusion and mild left basilar atelectasis. Electronically Signed   By: Ilona Sorrel M.D.   On: 06/05/2016 12:26   Dg Chest 2 View  06/03/2016  CLINICAL DATA:  Chest pain EXAM: CHEST  2 VIEW COMPARISON:   March 03, 2016 FINDINGS: There is mild scarring in the left base. Lungs elsewhere are clear. Heart is upper normal in size with pulmonary vascularity within normal limits. Patient is status post coronary artery bypass grafting. No adenopathy. There is postoperative change in the lower cervical spine region. IMPRESSION: Scarring left base. No edema or consolidation. Stable cardiac silhouette. Electronically Signed   By: Lowella Grip III M.D.   On: 06/03/2016 13:19   Mr Foot Right W Wo Contrast  06/02/2016  CLINICAL DATA:  Nonhealing wounds at the distal aspect of the right first metatarsal and between the fourth and fifth toes for 4-5 weeks. Question abscess or osteomyelitis. EXAM: MRI OF THE RIGHT FOREFOOT WITHOUT AND WITH CONTRAST TECHNIQUE: Multiplanar, multisequence MR imaging was performed both before and after administration of intravenous contrast. CONTRAST:  18 ml MULTIHANCE GADOBENATE DIMEGLUMINE 529 MG/ML IV SOLN COMPARISON:  Plain films of the right foot 05/24/2016. FINDINGS: There may be a skin wound along the head of the first metatarsal versus artifactual loss of visualization of cutaneous tissues. Artifact is favored. No abscess is identified. No bone marrow signal abnormality to suggest osteomyelitis is seen. Mild first MTP degenerative change is seen with some associated marrow edema in the medial sesamoid. Intrinsic musculature the foot demonstrates some fatty replacement with associated mildly increased T2 signal. IMPRESSION: Negative for abscess or osteomyelitis. Mild first MTP osteoarthritis. Electronically Signed   By: Inge Rise M.D.   On: 06/02/2016 16:03   Dg Foot Complete Right  05/24/2016  CLINICAL DATA:  Nonhealing wound in  the first toe EXAM: RIGHT FOOT COMPLETE - 3+ VIEW COMPARISON:  None. FINDINGS: Mild degenerative changes of the first MTP joint are noted. No acute fracture or dislocation is seen. Mild degenerative changes are noted in the tarsal bones as well. No gross  soft tissue abnormality is seen. No bony erosion to suggest osteomyelitis is noted. IMPRESSION: Degenerative change without acute bony abnormality. Electronically Signed   By: Inez Catalina M.D.   On: 05/24/2016 21:18   Anti-infectives: Anti-infectives    Start     Dose/Rate Route Frequency Ordered Stop   06/11/16 0945  vancomycin (VANCOCIN) IVPB 1000 mg/200 mL premix  Status:  Discontinued     1,000 mg 200 mL/hr over 60 Minutes Intravenous Every 12 hours 06/11/16 0930 06/14/16 2158   06/03/16 1400  piperacillin-tazobactam (ZOSYN) IVPB 3.375 g  Status:  Discontinued     3.375 g 12.5 mL/hr over 240 Minutes Intravenous Every 8 hours 06/03/16 1352 06/15/16 0820   06/03/16 1400  vancomycin (VANCOCIN) IVPB 1000 mg/200 mL premix  Status:  Discontinued     1,000 mg 200 mL/hr over 60 Minutes Intravenous Every 12 hours 06/03/16 1352 06/11/16 0930      Assessment/Plan: s/p Procedure(s): RIGHT TRANSMETATARSAL AMPUTATION (Right) Stable overall. Will not have him walk with physical therapy. Limit transfer bed to chair with the Darco shoe present   LOS: 10 days   Early, Todd 06/15/2016, 8:25 AM

## 2016-06-16 DIAGNOSIS — Z8673 Personal history of transient ischemic attack (TIA), and cerebral infarction without residual deficits: Secondary | ICD-10-CM

## 2016-06-16 DIAGNOSIS — E119 Type 2 diabetes mellitus without complications: Secondary | ICD-10-CM

## 2016-06-16 DIAGNOSIS — Z794 Long term (current) use of insulin: Secondary | ICD-10-CM

## 2016-06-16 DIAGNOSIS — I739 Peripheral vascular disease, unspecified: Secondary | ICD-10-CM

## 2016-06-16 DIAGNOSIS — Z89512 Acquired absence of left leg below knee: Secondary | ICD-10-CM

## 2016-06-16 LAB — BASIC METABOLIC PANEL
Anion gap: 13 (ref 5–15)
BUN: 24 mg/dL — AB (ref 6–20)
CO2: 24 mmol/L (ref 22–32)
CREATININE: 1.44 mg/dL — AB (ref 0.61–1.24)
Calcium: 8.8 mg/dL — ABNORMAL LOW (ref 8.9–10.3)
Chloride: 97 mmol/L — ABNORMAL LOW (ref 101–111)
GFR, EST AFRICAN AMERICAN: 54 mL/min — AB (ref >60)
GFR, EST NON AFRICAN AMERICAN: 46 mL/min — AB (ref >60)
GLUCOSE: 162 mg/dL — AB (ref 65–99)
POTASSIUM: 3.7 mmol/L (ref 3.5–5.1)
SODIUM: 134 mmol/L — AB (ref 135–145)

## 2016-06-16 LAB — CBC
HCT: 35 % — ABNORMAL LOW (ref 39.0–52.0)
HEMOGLOBIN: 11.2 g/dL — AB (ref 13.0–17.0)
MCH: 27.1 pg (ref 26.0–34.0)
MCHC: 32 g/dL (ref 30.0–36.0)
MCV: 84.5 fL (ref 78.0–100.0)
PLATELETS: 360 10*3/uL (ref 150–400)
RBC: 4.14 MIL/uL — AB (ref 4.22–5.81)
RDW: 15.2 % (ref 11.5–15.5)
WBC: 12.2 10*3/uL — AB (ref 4.0–10.5)

## 2016-06-16 LAB — GLUCOSE, CAPILLARY
GLUCOSE-CAPILLARY: 198 mg/dL — AB (ref 65–99)
Glucose-Capillary: 131 mg/dL — ABNORMAL HIGH (ref 65–99)
Glucose-Capillary: 138 mg/dL — ABNORMAL HIGH (ref 65–99)
Glucose-Capillary: 182 mg/dL — ABNORMAL HIGH (ref 65–99)

## 2016-06-16 NOTE — Progress Notes (Signed)
[facility]                                                                              Patient Demographics  Anthony Holder, is a 75 y.o. male, DOB - Sep 14, 1941, ZOX:[id number]  Admit date - 06/03/2016   Admitting Physician Starleen Arms, MD  Outpatient Primary MD for the patient is [name], MD  Outpatient specialists:   Chief Complaint  Patient presents with  . Tachycardia  . Toe Pain      Brief summary   Patient is a 75 year old male with severe PVD,with prior left BKA and known ischemic ulcers right foot, chronic systolic CHF, AS with PAH, hypertension, history of CAD/ CABG 5 in 2010, DM, BPH, dyslipidemia and history of CVA. Patient recently underwent aortogram with right lower extremity runoff and may was found to have significant disease that was not amenable to intervention or surgery and medical therapy was opted for by the vascular surgeon. The past 2 days he noticed increased darkening of the pinky toe of the right foot with pain. He also noticed palpitations. He underwent an outpatient MRI on 6/14 that showed negative findings regarding abscess or osteomyelitis. During further workup patient underwent left heart cath showing multivessel disease and severe aortic stenosis along with severe systolic dysfunction with EF 25%, cardiology following, he also had evidence of right foot fifth toe dry gangrene with surrounding cellulitis and severe PAD. Vascular surgery following. Pt had right leg angioplasty on 06/09/2016, cardiology, vascular surgery following. Cardiology is consulted in cardiothoracic surgery for multivessel disease and severe AS.  Cardiothoracic surgery following for possible TAVR.  Likely would need to be done electively when he has recovered from recent vascular surgery and amputation.    Assessment & Plan   Elevated troponin/Hx of CABG x 5 2010/abnormal EKG, cardiomyopathy, palpitations - Patient without chest pain, troponin  trended up to 1.08, new ST segment downsloping and T-wave inversion on EKG. Cardiology consulted, per Dr. Antoine Poche, needs a right and left cardiac cath, Cardiology following. Currently chest pain-free, on aspirin, beta blocker for secondary prevention, has statin allergy. He underwent catheterization on 06/07/2016 showing multivessel disease, EF of 25% along with severe AS. Cardiology and cardiothoracic surgery following.  Pt seemed to tolerated 1 unit PRBC on 6/22.   Aortic stenosis, moderate/Pulmonary hypertension - as above. Cardiothoracic surgery following for possible TAVR.   need to be done electively when he has recovered from recent vascular surgery and amputation.   Mild acute on Chronic systolic congestive heart failure, NYHA class 1 EF 25%. With moderate to severe AS. Currently symptom-free, blood pressure is borderline, on beta blocker and Lasix, not on ACE inhibitor or ARB due to soft blood pressures and moderate to severe AS.  Continue diuretics per cardiology service.    PVD (peripheral vascular disease)/S/P BKA (below knee amputation) unilateral/ Ischemic ulcer of right foot with dry gangrene of the fifth toe and then around the first metatarsal head with some surrounding cellulitis  - blood cultures NGTD, no other source of infection, UA unremarkable, chest x-ray unimpressive, Continue IV vancomycin and Zosyn. Vascular surgery following, discussed his case with Dr. Arbie Cookey  on 06/08/2016, vascular surgery will attempt right leg angioplasty on 06/09/2016. For now no plans of amputation.  Pt had endovascular procedures on 6/21 below:  #1 successful atherectomy and angioplasty of a occluded right peroneal artery using a CSI 1.25 device and a coyote 3 mm balloon. Residual stenosis was less than 10% #2 successful atherectomy and drug coated balloon angioplasty of the right superficial femoral and popliteal artery treating 90% stenosis. Residual stenosis was less than  10%. #3 2 non-flow-limiting dissections were seen in the popliteal superficial femoral artery.  Pt is post-op from right foot transmetatarsal amputation 6/23 with vascular surgery. - see notes.  Bandage changes started 6/25.  Wound appears to be healing.   Discontinued zosyn and vancomycin 6/27.   Blood cultures no growth x 5 days.  Switch to oral antibiotic 6/28.    Per surgery:    Dressing change: Dry 4X4's, kerlix, and 4" ace daily  Weight bearing status: Pt is stand and pivot with bed to chair transfers with darco boot ONLY   HTN (hypertension) -Blood pressure currently well-controlled on Tenormin  Hyperlipidemia -Continue omega-3 fatty acids  BPH (benign prostatic hypertrophy)- Has foley in place now. Would d/c foley in next 1-2 days.  He will need voiding trial with his history of acute urinary retention.   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  Hg improved, following.   FOBT, anemia panel inconclusive but most consistent with iron deficiency.  Will need to get him on iron supplement at discharge.    CKD 3 - following creatinine. Had been stable. Following.   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  Will need to monitor closely as he has not been eating as well but improving consumption daily.  Continue monitoring.  See below.    Hypokalemia - repleted, will follow.   Lab Results  Component Value Date   HGBA1C 8.0* 06/03/2016   CBG (last 3)   Recent Labs  06/15/16 2020 06/16/16 0806 06/16/16 1133  GLUCAP 187* 131* 138*    Code Status: Full CODE STATUS DVT Prophylaxis:  Lovenox  Family Communication: Discussed with the patient   Disposition Plan: SNF, likely 6/29 if bed available  Procedures:   TTE - Left ventricle: The cavity size was normal. Wall  thickness was normal. Systolic function was severely reduced. The estimated ejection fraction was in the range of 25% to 30%. Akinesis of the inferior myocardium. - Aortic valve: There was moderate to severe stenosis. Valve area (VTI): 0.66 cm^2. Valve area (Vmax): 0.62 cm^2. Valve area (Vmean): 0.58 cm^2. - Left atrium: The atrium was mildly dilated. - Pulmonary arteries: Systolic pressure was moderately increased. PA peak pressure: 54 mm Hg (S).  MR R foot - no osteomyelitis or abscess.  L heart Cath 06-07-16 - multivessel CAD with severe aortic stenosis 6/23 - transmetarsal amputation  Consultants:    Cardiology Vascular surgery Cardiology Has consulted Dr. Zenaida Niece tright cardiothoracic surgery  Antibiotics :   Anti-infectives    Start     Dose/Rate Route Frequency Ordered Stop   06/16/16 1000  doxycycline (VIBRA-TABS) tablet 100 mg     100 mg Oral Every 12 hours 06/15/16 0949     06/11/16 0945  vancomycin (VANCOCIN) IVPB 1000 mg/200 mL premix  Status:  Discontinued     1,000 mg 200 mL/hr  over 60 Minutes Intravenous Every 12 hours 06/11/16 0930 06/14/16 2158   06/03/16 1400  piperacillin-tazobactam (ZOSYN) IVPB 3.375 g  Status:  Discontinued     3.375 g 12.5 mL/hr over 240 Minutes Intravenous Every 8 hours 06/03/16 1352 06/15/16 0820   06/03/16 1400  vancomycin (VANCOCIN) IVPB 1000 mg/200 mL premix  Status:  Discontinued     1,000 mg 200 mL/hr over 60 Minutes Intravenous Every 12 hours 06/03/16 1352 06/11/16 0930     Medications  Scheduled Meds: . aspirin EC  81 mg Oral Daily  . baclofen  10 mg Oral Daily  . carvedilol  12.5 mg Oral BID WC  . docusate sodium  100 mg Oral Daily  . doxycycline  100 mg Oral Q12H  . enoxaparin (LOVENOX) injection  40 mg Subcutaneous Q24H  . furosemide  40 mg Oral BID  . gabapentin  300 mg Oral TID  . insulin aspart  0-5 Units Subcutaneous QHS  . insulin aspart  0-9 Units Subcutaneous TID WC  . insulin aspart  3 Units Subcutaneous TID WC  .  insulin NPH Human  20 Units Subcutaneous BID AC & HS  . pantoprazole  40 mg Oral Daily  . potassium chloride SA  20 mEq Oral Daily   Continuous Infusions: . lactated ringers 10 mL/hr at 06/11/16 1052   PRN Meds:.sodium chloride, acetaminophen **OR** acetaminophen, gi cocktail, guaiFENesin-dextromethorphan, hydrALAZINE, labetalol, magnesium sulfate 1 - 4 g bolus IVPB, metoprolol, morphine injection, ondansetron, oxyCODONE-acetaminophen, phenol, potassium chloride, traMADol, zolpidem   Subjective:   Rylynn Daidone  Pt was drowsy early this morning, later woke up, denies pain , reported feeling better     Objective:   Filed Vitals:   06/14/16 2100 06/15/16 0533 06/15/16 1347 06/16/16 1520  BP: 130/65 122/64 124/57 118/61  Pulse: 69 64 71 72  Temp: 99 F (37.2 C) 97.5 F (36.4 C) 97.6 F (36.4 C) 97.6 F (36.4 C)  TempSrc: Oral Oral Oral Oral  Resp:  18 18 18   Height:      Weight:  84.6 kg (186 lb 8.2 oz)    SpO2: 96% 91% 94% 99%    Intake/Output Summary (Last 24 hours) at 06/16/16 1613 Last data filed at 06/16/16 1245  Gross per 24 hour  Intake    600 ml  Output   2700 ml  Net  -2100 ml    Wt Readings from Last 3 Encounters:  06/15/16 84.6 kg (186 lb 8.2 oz)  05/10/16 86.183 kg (190 lb)  05/06/16 88.905 kg (196 lb)   Exam  General: Alert and oriented x 3, NAD  HEENT:  NCAT  Neck: Supple, no JVD, no masses  Cardiovascular: S1 S2 auscultated, no rubs, Loud aortic systolic murmur, Regular rate and rhythm.  Respiratory: Clear to auscultation bilaterally, no wheezing, rales or rhonchi  Gastrointestinal: Soft, nontender, nondistended, + bowel sounds  Ext: no cyanosis clubbing, left BKA, right foot wound clean and dry.   Neuro: no new deficit  Skin: bandages clean and dry  Psych: Normal affect and demeanor, alert and oriented x3    Data Reviewed:  I have personally reviewed following labs and imaging studies  Micro Results Recent Results (from the past  240 hour(s))  Culture, blood (Routine X 2) w Reflex to ID Panel     Status: None   Collection Time: 06/06/16  5:09 PM  Result Value Ref Range Status   Specimen Description BLOOD RIGHT HAND  Final   Special Requests BOTTLES DRAWN AEROBIC AND  ANAEROBIC 5CC  Final   Culture NO GROWTH 5 DAYS  Final   Report Status 06/11/2016 FINAL  Final  Culture, blood (Routine X 2) w Reflex to ID Panel     Status: None   Collection Time: 06/06/16  5:09 PM  Result Value Ref Range Status   Specimen Description BLOOD RIGHT HAND  Final   Special Requests IN PEDIATRIC BOTTLE 5CC  Final   Culture NO GROWTH 5 DAYS  Final   Report Status 06/11/2016 FINAL  Final  Surgical pcr screen     Status: None   Collection Time: 06/10/16 10:04 PM  Result Value Ref Range Status   MRSA, PCR NEGATIVE NEGATIVE Final   Staphylococcus aureus NEGATIVE NEGATIVE Final    Comment:        The Xpert SA Assay (FDA approved for NASAL specimens in patients over 74 years of age), is one component of a comprehensive surveillance program.  Test performance has been validated by [facility] for patients greater than or equal to 83 year old. It is not intended to diagnose infection nor to guide or monitor treatment.    Radiology Reports Dg Chest 2 View  06/05/2016  CLINICAL DATA:  Shortness breath EXAM: CHEST  2 VIEW COMPARISON:  06/03/2016 chest radiograph. FINDINGS: Sternotomy wires appear aligned and intact. CABG clips overlie the mediastinum. Partially visualized surgical hardware from ACDF overlying the lower cervical spine. Stable cardiomediastinal silhouette with mild cardiomegaly. No pneumothorax. Stable trace left pleural effusion. No right pleural effusion. No pulmonary edema. No acute consolidative airspace disease. Stable mild left basilar atelectasis. IMPRESSION: 1. Stable mild cardiomegaly without pulmonary edema. 2. Stable trace left pleural effusion and mild left basilar atelectasis. Electronically Signed   By: Delbert Phenix M.D.   On: 06/05/2016 12:26   Dg Chest 2 View  06/03/2016  CLINICAL DATA:  Chest pain EXAM: CHEST  2 VIEW COMPARISON:  March 03, 2016 FINDINGS: There is mild scarring in the left base. Lungs elsewhere are clear. Heart is upper normal in size with pulmonary vascularity within normal limits. Patient is status post coronary artery bypass grafting. No adenopathy. There is postoperative change in the lower cervical spine region. IMPRESSION: Scarring left base. No edema or consolidation. Stable cardiac silhouette. Electronically Signed   By: Bretta Bang III M.D.   On: 06/03/2016 13:19   Mr Foot Right W Wo Contrast  06/02/2016  CLINICAL DATA:  Nonhealing wounds at the distal aspect of the right first metatarsal and between the fourth and fifth toes for 4-5 weeks. Question abscess or osteomyelitis. EXAM: MRI OF THE RIGHT FOREFOOT WITHOUT AND WITH CONTRAST TECHNIQUE: Multiplanar, multisequence MR imaging was performed both before and after administration of intravenous contrast. CONTRAST:  18 ml MULTIHANCE GADOBENATE DIMEGLUMINE 529 MG/ML IV SOLN COMPARISON:  Plain films of the right foot 05/24/2016. FINDINGS: There may be a skin wound along the head of the first metatarsal versus artifactual loss of visualization of cutaneous tissues. Artifact is favored. No abscess is identified. No bone marrow signal abnormality to suggest osteomyelitis is seen. Mild first MTP degenerative change is seen with some associated marrow edema in the medial sesamoid. Intrinsic musculature the foot demonstrates some fatty replacement with associated mildly increased T2 signal. IMPRESSION: Negative for abscess or osteomyelitis. Mild first MTP osteoarthritis. Electronically Signed   By: Drusilla Kanner M.D.   On: 06/02/2016 16:03   Dg Foot Complete Right  05/24/2016  CLINICAL DATA:  Nonhealing wound in the first toe EXAM: RIGHT FOOT COMPLETE -  3+ VIEW COMPARISON:  None. FINDINGS: Mild degenerative changes of the first MTP joint  are noted. No acute fracture or dislocation is seen. Mild degenerative changes are noted in the tarsal bones as well. No gross soft tissue abnormality is seen. No bony erosion to suggest osteomyelitis is noted. IMPRESSION: Degenerative change without acute bony abnormality. Electronically Signed   By: Alcide Clever M.D.   On: 05/24/2016 21:18   Lab Data:  CBC:  Recent Labs Lab 06/10/16 0410 06/11/16 0425 06/12/16 0229 06/14/16 0523 06/16/16 0503  WBC 12.1* 11.9* 14.3* 13.9* 12.2*  HGB 8.0* 10.9* 9.9* 10.1* 11.2*  HCT 25.1* 33.2* 30.6* 32.1* 35.0*  MCV 84.5 82.2 82.7 85.6 84.5  PLT 247 263 287 276 360   Basic Metabolic Panel:  Recent Labs Lab 06/10/16 0410 06/11/16 0425 06/12/16 0229 06/14/16 0523 06/16/16 0503  NA 132* 135 134* 135 134*  K 3.5 3.5 3.4* 3.9 3.7  CL 99* 103 101 101 97*  CO2 25 25 25 23 24   GLUCOSE 157* 135* 261* 128* 162*  BUN 20 17 17 20  24*  CREATININE 1.70* 1.69* 1.76* 1.64* 1.44*  CALCIUM 8.1* 8.4* 8.3* 8.6* 8.8*   GFR: Estimated Creatinine Clearance: 46.5 mL/min (by C-G formula based on Cr of 1.44). Liver Function Tests:  Recent Labs Lab 06/10/16 0410 06/12/16 0229  AST 30 18  ALT 21 26  ALKPHOS 51 51  BILITOT 0.5 0.8  PROT 5.8* 6.2*  ALBUMIN 2.0* 2.1*   No results for input(s): LIPASE, AMYLASE in the last 168 hours. No results for input(s): AMMONIA in the last 168 hours. Coagulation Profile: No results for input(s): INR, PROTIME in the last 168 hours. Cardiac Enzymes: No results for input(s): CKTOTAL, CKMB, CKMBINDEX, TROPONINI in the last 168 hours. BNP (last 3 results) No results for input(s): PROBNP in the last 8760 hours. HbA1C: No results for input(s): HGBA1C in the last 72 hours. CBG:  Recent Labs Lab 06/15/16 1106 06/15/16 1613 06/15/16 2020 06/16/16 0806 06/16/16 1133  GLUCAP 199* 223* 187* 131* 138*   Lipid Profile: No results for input(s): CHOL, HDL, LDLCALC, TRIG, CHOLHDL, LDLDIRECT in the last 72 hours. Thyroid  Function Tests: No results for input(s): TSH, T4TOTAL, FREET4, T3FREE, THYROIDAB in the last 72 hours. Anemia Panel: No results for input(s): VITAMINB12, FOLATE, FERRITIN, TIBC, IRON, RETICCTPCT in the last 72 hours. Urine analysis:    Component Value Date/Time   COLORURINE YELLOW 06/06/2016 1037   APPEARANCEUR HAZY* 06/06/2016 1037   LABSPEC 1.022 06/06/2016 1037   PHURINE 5.5 06/06/2016 1037   GLUCOSEU 100* 06/06/2016 1037   HGBUR SMALL* 06/06/2016 1037   BILIRUBINUR NEGATIVE 06/06/2016 1037   KETONESUR NEGATIVE 06/06/2016 1037   PROTEINUR 100* 06/06/2016 1037   UROBILINOGEN 1.0 06/13/2015 0957   NITRITE NEGATIVE 06/06/2016 1037   LEUKOCYTESUR NEGATIVE 06/06/2016 1037   Signature  Timothy Townsel M.D PhD on 06/16/2016 at 4:13 PM  Between 7am to 7pm - Pager - [phone], After 7pm go to [email or web address] - password [facility]  [facility]  - Office  [phone]

## 2016-06-16 NOTE — Progress Notes (Signed)
CSW received call that pt is now agreeable to SNF- met with pt and pt wife at bedside to confirm.  They only agree to SNF if they can go to [facility] or [facility]- facilities are reviewing.  Plan for DC to SNF tomorrow (6/29)  Domenica Reamer, [place] Social Worker [phone]

## 2016-06-16 NOTE — Progress Notes (Signed)
Spoke with MD about wearing bearing instructions. MD stated "Pt is stand and pivot with bed to chair transfers with darco boot ONLY."    Ruben Reason, RN

## 2016-06-16 NOTE — Care Management (Signed)
Received call from Bedside RN that MD had persuaded pt to go to SNF. RN was to make SW of this change in plans. CM will continue to follow for disposition needs if this plan should change again.

## 2016-06-16 NOTE — Care Management Important Message (Signed)
Important Message  Patient Details  Name: Anthony Holder MRN: [id number] Date of Birth: 02/04/1941   Medicare Important Message Given:  Yes    [name] 06/16/2016, 10:08 AM

## 2016-06-16 NOTE — Progress Notes (Addendum)
[facility] well last night per wife.   Objective 124/57 71 97.6 F (36.4 C) (Oral) 18 94%  Intake/Output Summary (Last 24 hours) at 06/16/16 7670 Last data filed at 06/15/16 2152  Gross per 24 hour  Intake    360 ml  Output   2660 ml  Net  -2300 ml    Right trans met amputation site clean and dry with slight erythema  Cap refill brisk at amputation site, small dusky area lateral incision no change. Dark striation lines on plantar surface of foot Palpable popliteal pulse   Assessment/Planning: POD # 5 right transmetatarsal amputation. Amputation site appears viable   [facility] 06/16/2016 8:07 AM --  Laboratory Lab Results:  Recent Labs  06/14/16 0523 06/16/16 0503  WBC 13.9* 12.2*  HGB 10.1* 11.2*  HCT 32.1* 35.0*  PLT 276 360   BMET  Recent Labs  06/14/16 0523 06/16/16 0503  NA 135 134*  K 3.9 3.7  CL 101 97*  CO2 23 24  GLUCOSE 128* 162*  BUN 20 24*  CREATININE 1.64* 1.44*  CALCIUM 8.6* 8.8*    COAG Lab Results  Component Value Date   INR 1.55* 06/09/2016   INR 1.36 06/05/2016   INR 1.29 06/04/2016   No results found for: PTT    I have examined the patient, reviewed and agree with above.Concerned about possibility of discharge. Explained that his antibiotics could be changed to oral and that we would continue to follow his transmetatarsal healing at home. Continues to be at risk for BKA. Still has some breakdown over the skin edges of his transmetatarsal amputation. Discussed this at length with the patient and his wife present who understand.  Curt Jews, MD 06/16/2016 3:17 PM

## 2016-06-16 NOTE — Consult Note (Signed)
   [facility] CM Inpatient Consult   06/16/2016  Anthony Holder 10/12/1941 [id number]    Patient screened for potential [facility] Care Management services. Chart reviewed. Noted discharge plan is for SNF.  There are no identifiable [facility] Care Management needs at this time. Spoke with inpatient RNCM to confirm.  If patient's post hospital needs change, please place a [facility] Care Management consult. For questions please contact:  Marthenia Rolling, [place], RN,[name] [facility] Liaison [phone]

## 2016-06-16 NOTE — Clinical Social Work Note (Signed)
Patient refusing SNF placement.  Patient states patient will discharge to patient's granddaughters home to receive home with home health services.  RNCM and Charge RN updated.  LCSW signing off.  Lubertha Sayres, [place] [facility]: [phone] Surgical: [phone]

## 2016-06-16 NOTE — NC FL2 (Signed)
[place] LEVEL OF CARE SCREENING TOOL     IDENTIFICATION  Patient Name: Anthony Holder Birthdate: [birth year] Sex: male Admission Date (Current Location): 06/03/2016  [facility] and [place] Number:  [employer] and Address:  [facility], [place] [address] [place], [place] [address]      Provider Number: [id number]  Attending Physician Name and Address:  Florencia Reasons, MD  Relative Name and Phone Number:       Current Level of Care: Hospital Recommended Level of Care: [place] Prior Approval Number:    Date Approved/Denied:   PASRR Number: [id number]  Discharge Plan: SNF    Current Diagnoses: Patient Active Problem List   Diagnosis Date Noted  . CAD in native artery   . Aortic stenosis, severe   . Coronary artery disease involving coronary bypass graft of native heart   . Dilated aortic root ([place])   . 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 PVD (peripheral vascular disease) ([place]) 06/03/2016  . Elevated troponin 06/03/2016  . Pulmonary hypertension ([place]) 06/03/2016  . Chronic systolic congestive heart failure, NYHA class 1 ([place]) 06/03/2016  . Ischemic ulcer of right foot ([place]) 06/03/2016  . Tachycardia 06/03/2016  . ICM-25-30% March 2017 echo 06/03/2016  . Atherosclerosis of native arteries of the extremities with ulceration ([place]) 05/06/2016  . HTN (hypertension) 03/30/2016  . Chronic diastolic heart failure ([place]) 10/29/2015  . BMI 28.0-28.9,adult 10/29/2015  . 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 S/P BKA (below knee amputation) unilateral ([place])   . Vitamin D deficiency 04/07/2014  . Medication management 04/07/2014  . Obesity   . Diabetic neuropathy ([place])   . History of CVA (cerebrovascular accident) 03/29/2012  . Moderate to severe aortic stenosis 03/29/2012  . History of skin cancer 03/29/2012  . Hyperlipidemia 04/16/2009  . 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 Hypertensive  heart disease   . Hx of CABG x 5 2010   . Carotid artery disease-RCEA 2010   . Osteoarthritis   . Spinal stenosis of lumbar region   . BPH (benign prostatic hypertrophy)     Orientation RESPIRATION BLADDER Height & Weight     Self, Time, Situation, Place  Normal External catheter Weight: 84.6 kg (186 lb 8.2 oz) Height:  5\' 10"  (177.8 cm)  BEHAVIORAL SYMPTOMS/MOOD NEUROLOGICAL BOWEL NUTRITION STATUS      Continent Diet (carb modified)  AMBULATORY STATUS COMMUNICATION OF NEEDS Skin   Extensive Assist Verbally Surgical wounds                       Personal Care Assistance Level of Assistance  Bathing, Dressing Bathing Assistance: Maximum assistance   Dressing Assistance: Maximum assistance     Functional Limitations Info             SPECIAL CARE FACTORS FREQUENCY  PT (By licensed PT), OT (By licensed OT)     PT Frequency: 5/wk OT Frequency: 5/wk            Contractures      Additional Factors Info  Code Status, Allergies, Insulin Sliding Scale Code Status Info: FULL Allergies Info: Statins   Insulin Sliding Scale Info: 6/day       Current Medications (06/16/2016):  This is the current hospital active medication list Current Facility-Administered Medications  Medication Dose Route Frequency Provider Last Rate Last Dose  . 0.9 %  sodium chloride infusion  500 mL Intravenous  Once PRN Alvia Grove, PA-C      . acetaminophen (TYLENOL) tablet 325-650 mg  325-650 mg Oral Q4H PRN Alvia Grove, PA-C   650 mg at 06/12/16 0026   Or  . acetaminophen (TYLENOL) suppository 325-650 mg  325-650 mg Rectal Q4H PRN Alvia Grove, PA-C      . aspirin EC tablet 81 mg  81 mg Oral Daily Samella Parr, NP   81 mg at 06/16/16 0851  . baclofen (LIORESAL) tablet 10 mg  10 mg Oral Daily [place] Marisa Hua, MD   10 mg at 06/15/16 2229  . carvedilol (COREG) tablet 12.5 mg  12.5 mg Oral BID WC Jerline Pain, MD   12.5 mg at 06/16/16 0851  . docusate sodium (COLACE)  capsule 100 mg  100 mg Oral Daily Alvia Grove, PA-C   100 mg at 06/16/16 0851  . doxycycline (VIBRA-TABS) tablet 100 mg  100 mg Oral Q12H [place] Marisa Hua, MD   100 mg at 06/16/16 0851  . enoxaparin (LOVENOX) injection 40 mg  40 mg Subcutaneous Q24H Samantha J Rhyne, PA-C   40 mg at 06/16/16 0850  . furosemide (LASIX) tablet 40 mg  40 mg Oral BID Josue Hector, MD   40 mg at 06/16/16 0851  . gabapentin (NEURONTIN) capsule 300 mg  300 mg Oral TID Samella Parr, NP   300 mg at 06/16/16 0851  . gi cocktail (Maalox,Lidocaine,Donnatal)  30 mL Oral QID PRN Samella Parr, NP      . guaiFENesin-dextromethorphan (ROBITUSSIN DM) 100-10 MG/5ML syrup 15 mL  15 mL Oral Q4H PRN Alvia Grove, PA-C      . hydrALAZINE (APRESOLINE) injection 5 mg  5 mg Intravenous Q20 Min PRN Alvia Grove, PA-C      . insulin aspart (novoLOG) injection 0-5 Units  0-5 Units Subcutaneous QHS Samella Parr, NP   2 Units at 06/14/16 2109  . insulin aspart (novoLOG) injection 0-9 Units  0-9 Units Subcutaneous TID WC Samella Parr, NP   3 Units at 06/15/16 1648  . insulin aspart (novoLOG) injection 3 Units  3 Units Subcutaneous TID WC Thurnell Lose, MD   3 Units at 06/15/16 1648  . insulin NPH Human (HUMULIN N,NOVOLIN N) injection 20 Units  20 Units Subcutaneous BID AC & HS [place] Marisa Hua, MD   20 Units at 06/15/16 2229  . labetalol (NORMODYNE,TRANDATE) injection 10 mg  10 mg Intravenous Q10 min PRN Alvia Grove, PA-C      . lactated ringers infusion   Intravenous Continuous Suzette Battiest, MD 10 mL/hr at 06/11/16 1052    . magnesium sulfate IVPB 2 g 50 mL  2 g Intravenous Daily PRN Alvia Grove, PA-C      . metoprolol (LOPRESSOR) injection 2-5 mg  2-5 mg Intravenous Q2H PRN Alvia Grove, PA-C      . morphine 2 MG/ML injection 2 mg  2 mg Intravenous Q2H PRN Samella Parr, NP   2 mg at 06/12/16 2257  . ondansetron (ZOFRAN) injection 4 mg  4 mg Intravenous Q6H PRN Alvia Grove, PA-C      .  oxyCODONE-acetaminophen (PERCOCET/ROXICET) 5-325 MG per tablet 1-2 tablet  1-2 tablet Oral Q4H PRN Angelia Mould, MD   2 tablet at 06/15/16 0058  . pantoprazole (PROTONIX) EC tablet 40 mg  40 mg Oral Daily Alvia Grove, PA-C   40 mg at 06/16/16 0851  . phenol (  CHLORASEPTIC) mouth spray 1 spray  1 spray Mouth/Throat PRN Alvia Grove, PA-C      . potassium chloride SA (K-DUR,KLOR-CON) CR tablet 20 mEq  20 mEq Oral Daily Samella Parr, NP   20 mEq at 06/16/16 0851  . potassium chloride SA (K-DUR,KLOR-CON) CR tablet 20-40 mEq  20-40 mEq Oral Daily PRN Alvia Grove, PA-C      . traMADol (ULTRAM) tablet 50 mg  50 mg Oral Q6H PRN Samella Parr, NP   50 mg at 06/13/16 0916  . zolpidem (AMBIEN) tablet 5 mg  5 mg Oral QHS PRN Jeryl Columbia, NP   5 mg at 06/09/16 2204     Discharge Medications: Please see discharge summary for a list of discharge medications.  Relevant Imaging Results:  Relevant Lab Results:   Additional Information SS#: [id number]  Cranford Mon, [place]

## 2016-06-17 DIAGNOSIS — Z794 Long term (current) use of insulin: Secondary | ICD-10-CM | POA: Diagnosis not present

## 2016-06-17 DIAGNOSIS — N182 Chronic kidney disease, stage 2 (mild): Secondary | ICD-10-CM | POA: Diagnosis not present

## 2016-06-17 DIAGNOSIS — L97519 Non-pressure chronic ulcer of other part of right foot with unspecified severity: Secondary | ICD-10-CM | POA: Diagnosis not present

## 2016-06-17 DIAGNOSIS — Z79899 Other long term (current) drug therapy: Secondary | ICD-10-CM | POA: Diagnosis not present

## 2016-06-17 DIAGNOSIS — Z4889 Encounter for other specified surgical aftercare: Secondary | ICD-10-CM | POA: Diagnosis not present

## 2016-06-17 DIAGNOSIS — E1122 Type 2 diabetes mellitus with diabetic chronic kidney disease: Secondary | ICD-10-CM | POA: Diagnosis not present

## 2016-06-17 DIAGNOSIS — Z8673 Personal history of transient ischemic attack (TIA), and cerebral infarction without residual deficits: Secondary | ICD-10-CM | POA: Diagnosis not present

## 2016-06-17 DIAGNOSIS — M79609 Pain in unspecified limb: Secondary | ICD-10-CM | POA: Diagnosis not present

## 2016-06-17 DIAGNOSIS — I350 Nonrheumatic aortic (valve) stenosis: Secondary | ICD-10-CM | POA: Diagnosis not present

## 2016-06-17 DIAGNOSIS — M6281 Muscle weakness (generalized): Secondary | ICD-10-CM | POA: Diagnosis not present

## 2016-06-17 DIAGNOSIS — I10 Essential (primary) hypertension: Secondary | ICD-10-CM | POA: Diagnosis not present

## 2016-06-17 DIAGNOSIS — L03119 Cellulitis of unspecified part of limb: Secondary | ICD-10-CM | POA: Diagnosis not present

## 2016-06-17 DIAGNOSIS — R2681 Unsteadiness on feet: Secondary | ICD-10-CM | POA: Diagnosis not present

## 2016-06-17 DIAGNOSIS — I509 Heart failure, unspecified: Secondary | ICD-10-CM | POA: Diagnosis not present

## 2016-06-17 DIAGNOSIS — I739 Peripheral vascular disease, unspecified: Secondary | ICD-10-CM | POA: Diagnosis not present

## 2016-06-17 DIAGNOSIS — I2510 Atherosclerotic heart disease of native coronary artery without angina pectoris: Secondary | ICD-10-CM | POA: Diagnosis not present

## 2016-06-17 DIAGNOSIS — I5022 Chronic systolic (congestive) heart failure: Secondary | ICD-10-CM | POA: Diagnosis not present

## 2016-06-17 DIAGNOSIS — I5020 Unspecified systolic (congestive) heart failure: Secondary | ICD-10-CM | POA: Diagnosis not present

## 2016-06-17 DIAGNOSIS — M4806 Spinal stenosis, lumbar region: Secondary | ICD-10-CM | POA: Diagnosis not present

## 2016-06-17 DIAGNOSIS — Z89512 Acquired absence of left leg below knee: Secondary | ICD-10-CM | POA: Diagnosis not present

## 2016-06-17 LAB — GLUCOSE, CAPILLARY
Glucose-Capillary: 144 mg/dL — ABNORMAL HIGH (ref 65–99)
Glucose-Capillary: 264 mg/dL — ABNORMAL HIGH (ref 65–99)
Glucose-Capillary: 238 mg/dL — ABNORMAL HIGH (ref 65–99)

## 2016-06-17 LAB — CBC
HCT: 34.9 % — ABNORMAL LOW (ref 39.0–52.0)
Hemoglobin: 11.2 g/dL — ABNORMAL LOW (ref 13.0–17.0)
MCH: 27.6 pg (ref 26.0–34.0)
MCHC: 32.1 g/dL (ref 30.0–36.0)
MCV: 86 fL (ref 78.0–100.0)
Platelets: 382 10*3/uL (ref 150–400)
RBC: 4.06 MIL/uL — ABNORMAL LOW (ref 4.22–5.81)
RDW: 14.9 % (ref 11.5–15.5)
WBC: 11.1 10*3/uL — ABNORMAL HIGH (ref 4.0–10.5)

## 2016-06-17 LAB — BASIC METABOLIC PANEL
ANION GAP: 8 (ref 5–15)
BUN: 26 mg/dL — ABNORMAL HIGH (ref 6–20)
CALCIUM: 8.9 mg/dL (ref 8.9–10.3)
CO2: 26 mmol/L (ref 22–32)
CREATININE: 1.46 mg/dL — AB (ref 0.61–1.24)
Chloride: 100 mmol/L — ABNORMAL LOW (ref 101–111)
GFR, EST AFRICAN AMERICAN: 53 mL/min — AB (ref >60)
GFR, EST NON AFRICAN AMERICAN: 46 mL/min — AB (ref >60)
GLUCOSE: 139 mg/dL — AB (ref 65–99)
Potassium: 3.9 mmol/L (ref 3.5–5.1)
Sodium: 134 mmol/L — ABNORMAL LOW (ref 135–145)

## 2016-06-17 MED ORDER — TAMSULOSIN HCL 0.4 MG PO CAPS: 0.4000 mg | ORAL_CAPSULE | Freq: Every day | ORAL | Status: DC

## 2016-06-17 MED ORDER — SENNOSIDES-DOCUSATE SODIUM 8.6-50 MG PO TABS: 1.0000 | ORAL_TABLET | Freq: Every day | ORAL | Status: DC

## 2016-06-17 MED ORDER — OXYCODONE-ACETAMINOPHEN 5-325 MG PO TABS: 1.0000 | ORAL_TABLET | Freq: Three times a day (TID) | ORAL | Status: DC | PRN

## 2016-06-17 MED ORDER — DOXYCYCLINE HYCLATE 100 MG PO TABS: 100.0000 mg | ORAL_TABLET | Freq: Two times a day (BID) | ORAL | Status: DC

## 2016-06-17 MED ORDER — CARVEDILOL 12.5 MG PO TABS: 12.5000 mg | ORAL_TABLET | Freq: Two times a day (BID) | ORAL | Status: AC

## 2016-06-17 MED ORDER — FERROUS SULFATE 325 (65 FE) MG PO TABS: 325.0000 mg | ORAL_TABLET | ORAL | Status: DC

## 2016-06-17 MED ORDER — INSULIN NPH (HUMAN) (ISOPHANE) 100 UNIT/ML ~~LOC~~ SUSP: 20.0000 [IU] | Freq: Two times a day (BID) | SUBCUTANEOUS | Status: DC

## 2016-06-17 MED ORDER — INSULIN ASPART 100 UNIT/ML ~~LOC~~ SOLN: 3.0000 [IU] | Freq: Three times a day (TID) | SUBCUTANEOUS | Status: DC

## 2016-06-17 MED ORDER — INSULIN REGULAR HUMAN 100 UNIT/ML IJ SOLN: INTRAMUSCULAR | Status: DC

## 2016-06-17 MED ORDER — TRAMADOL HCL 50 MG PO TABS
50.0000 mg | ORAL_TABLET | Freq: Four times a day (QID) | ORAL | Status: DC | PRN
Start: 2016-06-17 — End: 2016-08-27

## 2016-06-17 MED ORDER — INSULIN LISPRO 100 UNIT/ML CARTRIDGE: SUBCUTANEOUS | Status: DC

## 2016-06-17 MED ORDER — INSULIN ASPART 100 UNIT/ML ~~LOC~~ SOLN: SUBCUTANEOUS | Status: DC

## 2016-06-17 NOTE — Discharge Summary (Addendum)
Discharge Summary  Anthony Holder ACZ:[id number] DOB: Dec 27, 1940  PCP: [name], MD  Admit date: 06/03/2016 Discharge date: 06/17/2016  Time spent: >51mins  Recommendations for Outpatient Follow-up:  1. F/u with PMD at SNF for hospital discharge follow up, repeat cbc/bmp at follow up, please get FOBT at SNF, stool sample was not collected while he is in the hospital 2. F/u with vascular surgery 3. F/u with cardiology  Discharge Diagnoses:  Active Hospital Problems   Diagnosis Date Noted  . Ischemic ulcer of right foot (HCC) 06/03/2016  . CAD in native artery   . Aortic stenosis, severe   . Coronary artery disease involving coronary bypass graft of native heart   . Dilated aortic root (HCC)   . Ischemic ulcer of toe of right foot (HCC) 06/05/2016  . PVD (peripheral vascular disease) (HCC) 06/03/2016  . Elevated troponin 06/03/2016  . Pulmonary hypertension (HCC) 06/03/2016  . Chronic systolic congestive heart failure, NYHA class 1 (HCC) 06/03/2016  . Tachycardia 06/03/2016  . ICM-25-30% March 2017 echo 06/03/2016  . HTN (hypertension) 03/30/2016  . S/P BKA (below knee amputation) unilateral (HCC)   . History of CVA (cerebrovascular accident) 03/29/2012  . Moderate to severe aortic stenosis 03/29/2012  . Hyperlipidemia 04/16/2009  . BPH (benign prostatic hypertrophy)   . Hx of CABG x 5 2010   . 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 Carotid artery disease-RCEA 2010   . Spinal stenosis of lumbar region     Resolved Hospital Problems   Diagnosis Date Noted Date Resolved  . Insulin-requiring or dependent type II diabetes mellitus (HCC) 10/29/2015 06/03/2016    Discharge Condition: stable  Diet recommendation: heart healthy/carb modified  Filed Weights   06/14/16 0500 06/15/16 0533 06/17/16 0504  Weight: 83.8 kg (184 lb 11.9 oz) 84.6 kg (186 lb 8.2 oz) 79.7 kg (175 lb 11.3 oz)    History of present illness:  Chief Complaint: Progressive  skin changes right foot  HPI: Anthony Holder is a 75 y.o. male with medical history significant for known severe PVD with prior left BKA and known ischemic ulcers right foot, chronic systolic heart failure, aortic stenosis with pulmonary hypertension, hypertension, history of CAD and CABG 5 in 2010, diabetes on metformin, BPH, dyslipidemia and history of CVA. Patient recently underwent aortogram with right lower extremity runoff and may was found to have significant disease that was not amenable to intervention or surgery and medical therapy was opted for by the vascular surgeon. The past 2 days he's noticed increased darkening of the pinky toe of the right foot. He has not had any chest pain, shortness of breath, orthopnea although he has noticed increased reflux symptoms for the past several days. He did report that yesterday evening his right foot was hurting more than usual and when he checked his blood pressure his pulse was high and he continued to recheck his blood pressure so he can monitor his pulse which remained high during the night. He had no awareness of palpitations during this time. Wife reports his weight has actually gone down. He underwent an outpatient MRI yesterday on 6/14 that showed negative findings regarding abscess or osteomyelitis. While patient was in the ER he was noted to be tachycardic.  ED Course:  PO 98.1-BP 127/78-pulse 82 -respirations 19-room-air saturations 100% Chest x-ray: Scarring left base without edema or consolidation Lab data: poc Troponin 0.22, sodium 132, potassium 4.1, BUN 22, creatinine 1.16, glucose 233, anion gap 9, WBCs 16,200 differential  not obtained, hemoglobin 11.9, platelets 303,000.  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   Hyperlipidemia   Hx of CABG x 5 2010   Carotid artery disease-RCEA 2010   Spinal stenosis of lumbar region   BPH  (benign prostatic hypertrophy)   History of CVA (cerebrovascular accident)   Moderate to severe aortic stenosis   S/P BKA (below knee amputation) unilateral (HCC)   HTN (hypertension)   PVD (peripheral vascular disease) (HCC)   Elevated troponin   Pulmonary hypertension (HCC)   Chronic systolic congestive heart failure, NYHA class 1 (HCC)   Tachycardia   ICM-25-30% March 2017 echo   Ischemic ulcer of toe of right foot (HCC)   CAD in native artery   Aortic stenosis, severe   Coronary artery disease involving coronary bypass graft of native heart   Dilated aortic root (HCC)   Brief summary   Patient is a 75 year old male with severe PVD,with prior left BKA and known ischemic ulcers right foot, chronic systolic CHF, AS with PAH, hypertension, history of CAD/ CABG 5 in 2010, DM, BPH, dyslipidemia and history of CVA. Patient recently underwent aortogram with right lower extremity runoff and may was found to have significant disease that was not amenable to intervention or surgery and medical therapy was opted for by the vascular surgeon. The past 2 days he noticed increased darkening of the pinky toe of the right foot with pain. He also noticed palpitations. He underwent an outpatient MRI on 6/14 that showed negative findings regarding abscess or osteomyelitis. During further workup patient underwent left heart cath showing multivessel disease and severe aortic stenosis along with severe systolic dysfunction with EF 25%, cardiology following, he also had evidence of right foot fifth toe dry gangrene with surrounding cellulitis and severe PAD. Vascular surgery following. Pt had right leg angioplasty on 06/09/2016, cardiology, vascular surgery following. Cardiology is consulted in cardiothoracic surgery for multivessel disease and severe AS. Cardiothoracic surgery following for possible TAVR. Likely would need to be done electively when he has recovered from recent vascular surgery and amputation.    Assessment & Plan   Elevated troponin/Hx of CABG x 5 2010/abnormal EKG, cardiomyopathy, palpitations - Patient without chest pain, troponin trended up to 1.08, new ST segment downsloping and T-wave inversion on EKG. Cardiology consulted, per Dr. Antoine Poche, needs a right and left cardiac cath, Cardiology following. Currently chest pain-free, on aspirin, beta blocker for secondary prevention, has statin allergy. He underwent catheterization on 06/07/2016 showing multivessel disease, EF of 25% along with severe AS. Cardiology and cardiothoracic surgery following. Pt seemed to tolerated 1 unit PRBC on 6/22.   Aortic stenosis, moderate/Pulmonary hypertension - as above. Cardiothoracic surgery following for possible TAVR. need to be done electively when he has recovered from recent vascular surgery and amputation.   Mild acute on Chronic systolic congestive heart failure, NYHA class 1 EF 25%. With moderate to severe AS. Currently symptom-free, blood pressure is borderline, on beta blocker and Lasix, not on ACE inhibitor or ARB due to soft blood pressures and moderate to severe AS. Continue diuretics per cardiology service.   PVD (peripheral vascular disease)/S/P BKA (below knee amputation) unilateral/ Ischemic ulcer of right foot with dry gangrene of the fifth toe and then around the first metatarsal head with some surrounding cellulitis - blood cultures NGTD, no other source of infection, UA unremarkable, chest x-ray unimpressive, Continue IV vancomycin and  Zosyn. Vascular surgery following, discussed his case with Dr. Arbie Cookey on 06/08/2016, vascular surgery will attempt right leg angioplasty on 06/09/2016. For now no plans of amputation. Pt had endovascular procedures on 6/21 below: #1 successful atherectomy and angioplasty of a occluded right peroneal artery using a CSI 1.25 device and a coyote 3 mm balloon. Residual stenosis was less than 10% #2 successful  atherectomy and drug coated balloon angioplasty of the right superficial femoral and popliteal artery treating 90% stenosis. Residual stenosis was less than 10%. #3 2 non-flow-limiting dissections were seen in the popliteal superficial femoral artery.  Pt is post-op from right foot transmetatarsal amputation 6/23 with vascular surgery. -  Discontinued zosyn and vancomycin 6/27. Blood cultures no growth. Switch to oral antibiotic 6/28.   Per vascular surgery:  Dressing change: Dry 4X4's, kerlix, and 4" ace daily  Weight bearing status: Pt is stand and pivot with bed to chair transfers with darco boot ONLY Vascular surgery will see patient in a week per Dr Arbie Cookey.  HTN (hypertension) -Blood pressure currently well-controlled on coreg  Hyperlipidemia -Continue omega-3 fatty acids, h/o statin allergy  BPH (benign prostatic hypertrophy)- . Trial of flomax,   History of CVA (cerebrovascular accident)-Continue aspirin, Patient with documented statin allergy  Anemia - likely AOCD - 1 unit PRBC 6/22. Hgb stable at around 11 at discharge.  FOBT stool sample was not collected in the hospital, need to repeat FOBT at snf, anemia panel showed most consistent with iron deficiency. patient is started on iron supplement at discharge.   CKD 3 - following creatinine. Had been stable. 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  Per pharmacy novolog is not covered by his insurance, pharmacy does not know which short acting insulin patient's insurance cover, i have called into patient's pharmacy at [facility] pharmacy at [phone] for short acting insulin sliding scale prescription.    Hypokalemia - repleted, repeat labs at hospital discharge follow up      Code Status: Full CODE STATUS  Family Communication: Discussed with the patient and wife  in room  Disposition Plan: SNF on  6/29   Procedures:   TTE - Left ventricle: The cavity size was normal. Wall thickness was normal. Systolic function was severely reduced. The estimated ejection fraction was in the range of 25% to 30%. Akinesis of the inferior myocardium. - Aortic valve: There was moderate to severe stenosis. Valve area (VTI): 0.66 cm^2. Valve area (Vmax): 0.62 cm^2. Valve area (Vmean): 0.58 cm^2. - Left atrium: The atrium was mildly dilated. - Pulmonary arteries: Systolic pressure was moderately increased. PA peak pressure: 54 mm Hg (S).  MR R foot - no osteomyelitis or abscess.  L heart Cath 06-07-16 - multivessel CAD with severe aortic stenosis 6/23 - transmetarsal amputation  Consultants:  Cardiology Vascular surgery Cardiology Has consulted Dr. Zenaida Niece tright cardiothoracic surgery  Antibiotics :   Anti-infectives    Start   Dose/Rate Route Frequency Ordered Stop   06/16/16 1000  doxycycline (VIBRA-TABS) tablet 100 mg    100 mg Oral Every 12 hours 06/15/16 0949    06/11/16 0945  vancomycin (VANCOCIN) IVPB 1000 mg/200 mL premix Status: Discontinued    1,000 mg 200 mL/hr over 60 Minutes Intravenous Every 12 hours 06/11/16 0930 06/14/16 2158   06/03/16 1400  piperacillin-tazobactam (ZOSYN) IVPB 3.375 g Status: Discontinued    3.375 g  12.5 mL/hr over 240 Minutes Intravenous Every 8 hours 06/03/16 1352 06/15/16 0820   06/03/16 1400  vancomycin (VANCOCIN) IVPB 1000 mg/200 mL premix Status: Discontinued    1,000 mg 200 mL/hr over 60 Minutes Intravenous Every 12 hours 06/03/16 1352 06/11/16 0930         Discharge Exam: BP 108/64 mmHg  Pulse 70  Temp(Src) 98.1 F (36.7 C) (Oral)  Resp 18  Ht 5\' 10"  (1.778 m)  Wt 79.7 kg (175 lb 11.3 oz)  BMI 25.21 kg/m2  SpO2 93%    General: Alert and oriented x 3, NAD  HEENT: NCAT  Neck: Supple, no JVD, no masses  Cardiovascular: S1 S2  auscultated, no rubs, Loud aortic systolic murmur, Regular rate and rhythm.  Respiratory: Clear to auscultation bilaterally, no wheezing, rales or rhonchi  Gastrointestinal: Soft, nontender, nondistended, + bowel sounds  Ext: no cyanosis clubbing, left BKA, right foot wound clean and dry.  Neuro: no new deficit  Skin: bandages clean and dry  Psych: Normal affect and demeanor, alert and oriented x3   Discharge Instructions You were cared for by a hospitalist during your hospital stay. If you have any questions about your discharge medications or the care you received while you were in the hospital after you are discharged, you can call the unit and asked to speak with the hospitalist on call if the hospitalist that took care of you is not available. Once you are discharged, your primary care physician will handle any further medical issues. Please note that NO REFILLS for any discharge medications will be authorized once you are discharged, as it is imperative that you return to your primary care physician (or establish a relationship with a primary care physician if you do not have one) for your aftercare needs so that they can reassess your need for medications and monitor your lab values.      Discharge Instructions    Diet - low sodium heart healthy    Complete by:  As directed   Carb modified     Increase activity slowly    Complete by:  As directed   Activity per vascular surgery instruction            Medication List    STOP taking these medications        atenolol 50 MG tablet  Commonly known as:  TENORMIN     NOVOLIN N RELION 100 UNIT/ML injection  Generic drug:  insulin NPH Human      TAKE these medications        aspirin 81 MG tablet  Take 81 mg by mouth daily.     baclofen 10 MG tablet  Commonly known as:  LIORESAL  Take 1 tablet (10 mg total) by mouth daily.     carvedilol 12.5 MG tablet  Commonly known as:  COREG  Take 1 tablet (12.5 mg total) by  mouth 2 (two) times daily with a meal.     CINNAMON PO  Take 3 tablets by mouth 2 (two) times daily.     doxycycline 100 MG tablet  Commonly known as:  VIBRA-TABS  Take 1 tablet (100 mg total) by mouth every 12 (twelve) hours.     ferrous sulfate 325 (65 FE) MG tablet  Take 1 tablet (325 mg total) by mouth every other day.     fish oil-omega-3 fatty acids 1000 MG capsule  Take 1 g by mouth 2 (two) times daily.     furosemide 40 MG tablet  Commonly known as:  LASIX  TAKE ONE TABLET BY MOUTH  TWICE DAILY FOR BLOOD  PRESSURE AND FLUID.     gabapentin 300 MG capsule  Commonly known as:  NEURONTIN  Take 1 capsule (300 mg total) by mouth 3 (three) times daily.     insulin lispro 100 UNIT/ML cartridge  Commonly known as:  HUMALOG  Before each meal 3 times a day, 140-199 - 2 units, 200-250 - 4 units, 251-299 - 6 units,  300-349 - 8 units,  350 or above 10 units. Insulin PEN if approved, provide syringes and needles if needed.     insulin regular 100 units/mL injection  Commonly known as:  HUMULIN R  Before each meal 3 times a day, 140-199 - 2 units, 200-250 - 4 units, 251-299 - 6 units,  300-349 - 8 units,  350 or above 10 units. Insulin PEN if approved, provide syringes and needles if needed.     metFORMIN 500 MG 24 hr tablet  Commonly known as:  GLUCOPHAGE-XR  Take 1,000 mg by mouth 2 (two) times daily.     nitroGLYCERIN 0.4 MG SL tablet  Commonly known as:  NITROSTAT  Place 1 tablet (0.4 mg total) under the tongue every 5 (five) minutes as needed for chest pain.     oxyCODONE-acetaminophen 5-325 MG tablet  Commonly known as:  PERCOCET/ROXICET  Take 1 tablet by mouth every 8 (eight) hours as needed for severe pain.     potassium chloride SA 20 MEQ tablet  Commonly known as:  K-DUR,KLOR-CON  Take 1 tablet (20 mEq total) by mouth daily.     PROBIOTIC PO  Take 1 capsule by mouth 2 (two) times daily.     senna-docusate 8.6-50 MG tablet  Commonly known as:  Senokot-S  Take 1  tablet by mouth at bedtime.     tamsulosin 0.4 MG Caps capsule  Commonly known as:  FLOMAX  Take 1 capsule (0.4 mg total) by mouth daily after breakfast.     traMADol 50 MG tablet  Commonly known as:  ULTRAM  Take 1 tablet (50 mg total) by mouth every 6 (six) hours as needed for moderate pain.     vitamin B-12 500 MCG tablet  Commonly known as:  CYANOCOBALAMIN  Take 500 mcg by mouth daily.     vitamin C 500 MG tablet  Commonly known as:  ASCORBIC ACID  Take 500 mg by mouth daily.     Vitamin D-3 1000 units Caps  Take 5,000 Units by mouth daily.     VITAMIN E PO  Take 1 tablet by mouth daily.       Allergies  Allergen Reactions  . Statins Other (See Comments)    Patient prefers to not take statins   Follow-up Information    Follow up with Rollene Rotunda, MD On 07/22/2016.   Specialty:  Cardiology   Why:  9:45AM.    Contact information:   [address] [place] [address] [phone]       Follow up with MCKEOWN,WILLIAM [name], MD In 1 week.   Specialty:  Internal Medicine   Why:  hospital discahrge follow up   Contact information:   [address] [place] [address] [phone]       Follow up with Waverly Ferrari, MD In 1 month.   Specialties:  Vascular Surgery, Cardiology   Contact information:   [address] [place] [address] [phone]       Follow  up with Gretta Began, MD In 1 week.   Specialties:  Vascular Surgery, Cardiology   Contact information:   [address] [place] [address] [phone]        The results of significant diagnostics from this hospitalization (including imaging, microbiology, ancillary and laboratory) are listed below for reference.    Significant Diagnostic Studies: Dg Chest 2 View  06/05/2016  CLINICAL DATA:  Shortness breath EXAM: CHEST  2 VIEW COMPARISON:  06/03/2016 chest radiograph. FINDINGS: Sternotomy wires appear aligned and intact. CABG clips overlie the  mediastinum. Partially visualized surgical hardware from ACDF overlying the lower cervical spine. Stable cardiomediastinal silhouette with mild cardiomegaly. No pneumothorax. Stable trace left pleural effusion. No right pleural effusion. No pulmonary edema. No acute consolidative airspace disease. Stable mild left basilar atelectasis. IMPRESSION: 1. Stable mild cardiomegaly without pulmonary edema. 2. Stable trace left pleural effusion and mild left basilar atelectasis. Electronically Signed   By: Delbert Phenix M.D.   On: 06/05/2016 12:26   Dg Chest 2 View  06/03/2016  CLINICAL DATA:  Chest pain EXAM: CHEST  2 VIEW COMPARISON:  March 03, 2016 FINDINGS: There is mild scarring in the left base. Lungs elsewhere are clear. Heart is upper normal in size with pulmonary vascularity within normal limits. Patient is status post coronary artery bypass grafting. No adenopathy. There is postoperative change in the lower cervical spine region. IMPRESSION: Scarring left base. No edema or consolidation. Stable cardiac silhouette. Electronically Signed   By: Bretta Bang III M.D.   On: 06/03/2016 13:19   Mr Foot Right W Wo Contrast  06/02/2016  CLINICAL DATA:  Nonhealing wounds at the distal aspect of the right first metatarsal and between the fourth and fifth toes for 4-5 weeks. Question abscess or osteomyelitis. EXAM: MRI OF THE RIGHT FOREFOOT WITHOUT AND WITH CONTRAST TECHNIQUE: Multiplanar, multisequence MR imaging was performed both before and after administration of intravenous contrast. CONTRAST:  18 ml MULTIHANCE GADOBENATE DIMEGLUMINE 529 MG/ML IV SOLN COMPARISON:  Plain films of the right foot 05/24/2016. FINDINGS: There may be a skin wound along the head of the first metatarsal versus artifactual loss of visualization of cutaneous tissues. Artifact is favored. No abscess is identified. No bone marrow signal abnormality to suggest osteomyelitis is seen. Mild first MTP degenerative change is seen with some  associated marrow edema in the medial sesamoid. Intrinsic musculature the foot demonstrates some fatty replacement with associated mildly increased T2 signal. IMPRESSION: Negative for abscess or osteomyelitis. Mild first MTP osteoarthritis. Electronically Signed   By: Drusilla Kanner M.D.   On: 06/02/2016 16:03   Dg Foot Complete Right  05/24/2016  CLINICAL DATA:  Nonhealing wound in the first toe EXAM: RIGHT FOOT COMPLETE - 3+ VIEW COMPARISON:  None. FINDINGS: Mild degenerative changes of the first MTP joint are noted. No acute fracture or dislocation is seen. Mild degenerative changes are noted in the tarsal bones as well. No gross soft tissue abnormality is seen. No bony erosion to suggest osteomyelitis is noted. IMPRESSION: Degenerative change without acute bony abnormality. Electronically Signed   By: Alcide Clever M.D.   On: 05/24/2016 21:18    Microbiology: Recent Results (from the past 240 hour(s))  Surgical pcr screen     Status: None   Collection Time: 06/10/16 10:04 PM  Result Value Ref Range Status   MRSA, PCR NEGATIVE NEGATIVE Final   Staphylococcus aureus NEGATIVE NEGATIVE Final    Comment:        The Xpert SA Assay (FDA approved  for NASAL specimens in patients over 36 years of age), is one component of a comprehensive surveillance program.  Test performance has been validated by [facility] for patients greater than or equal to 3 year old. It is not intended to diagnose infection nor to guide or monitor treatment.      Labs: Basic Metabolic Panel:  Recent Labs Lab 06/11/16 0425 06/12/16 0229 06/14/16 0523 06/16/16 0503 06/17/16 0436  NA 135 134* 135 134* 134*  K 3.5 3.4* 3.9 3.7 3.9  CL 103 101 101 97* 100*  CO2 25 25 23 24 26   GLUCOSE 135* 261* 128* 162* 139*  BUN 17 17 20  24* 26*  CREATININE 1.69* 1.76* 1.64* 1.44* 1.46*  CALCIUM 8.4* 8.3* 8.6* 8.8* 8.9   Liver Function Tests:  Recent Labs Lab 06/12/16 0229  AST 18  ALT 26  ALKPHOS 51  BILITOT  0.8  PROT 6.2*  ALBUMIN 2.1*   No results for input(s): LIPASE, AMYLASE in the last 168 hours. No results for input(s): AMMONIA in the last 168 hours. CBC:  Recent Labs Lab 06/11/16 0425 06/12/16 0229 06/14/16 0523 06/16/16 0503 06/17/16 0436  WBC 11.9* 14.3* 13.9* 12.2* 11.1*  HGB 10.9* 9.9* 10.1* 11.2* 11.2*  HCT 33.2* 30.6* 32.1* 35.0* 34.9*  MCV 82.2 82.7 85.6 84.5 86.0  PLT 263 287 276 360 382   Cardiac Enzymes: No results for input(s): CKTOTAL, CKMB, CKMBINDEX, TROPONINI in the last 168 hours. BNP: BNP (last 3 results)  Recent Labs  02/27/16 1204  BNP 1997.3*    ProBNP (last 3 results) No results for input(s): PROBNP in the last 8760 hours.  CBG:  Recent Labs Lab 06/16/16 1642 06/16/16 2109 06/17/16 0734 06/17/16 1130 06/17/16 1456  GLUCAP 182* 198* 144* 264* 238*       Signed:  Aneya Daddona MD, PhD  [facility] 06/17/2016, 3:18 PM

## 2016-06-17 NOTE — Clinical Social Work Note (Signed)
Clinical Social Work Assessment  Patient Details  Name: Anthony Holder MRN: [id number] Date of Birth: 11-18-1941  Date of referral:  06/17/16               Reason for consult:  Facility Placement, Discharge Planning                Permission sought to share information with:  Chartered certified accountant granted to share information::  Yes, Verbal Permission Granted  Name::        Agency::  [facility]  Relationship::     Contact Information:     Housing/Transportation Living arrangements for the past 2 months:  [place] of Information:  Patient Patient Interpreter Needed:  None Criminal Activity/Legal Involvement Pertinent to Current Situation/Hospitalization:  No - Comment as needed Significant Relationships:  Adult Children, Spouse Lives with:  Spouse Do you feel safe going back to the place where you live?  No Need for family participation in patient care:  No (Coment) (Patient able to make own decisions.)  Care giving concerns:  Patient was reluctant to SNF placement, but is now agreeable.   Social Worker assessment / plan:  LCSW initially met with patient at bedside to discuss SNF placement at time of discharge. Patient was first refusing SNF placement, but spoke with MD and is now agreeable. LCSW has completed appropriate referrals for SNF placement in close proximity to patient and patient's family. LCSW to continue to follow and assist with discharge planning needs.  Employment status:  Retired Science writer) PT Recommendations:  [place] / Referral to community resources:  [place]  Patient/Family's Response to care:  Patient understanding and agreeable to [employer] of care.  Patient/Family's Understanding of and Emotional Response to Diagnosis, Current Treatment, and Prognosis:  Patient understanding and agreeable to LCSW plan of care.  Emotional  Assessment Appearance:  Appears stated age Attitude/Demeanor/Rapport:  Other (Appropriate) Affect (typically observed):  Accepting, Appropriate, Pleasant Orientation:  Oriented to Self, Oriented to Place, Oriented to  Time, Oriented to Situation Alcohol / Substance use:  Not Applicable Psych involvement (Current and /or in the community):  No (Comment) (Not appropriate on this admission.)  Discharge Needs  Concerns to be addressed:  No discharge needs identified Readmission within the last 30 days:  No Current discharge risk:  None Barriers to Discharge:  No Barriers Identified   Caroline Sauger, LCSW 06/17/2016, 10:55 AM [phone]

## 2016-06-17 NOTE — Progress Notes (Signed)
Patient discharged to [facility] via [facility] transport.  Report called to [facility] Nurse at [facility], Harlow Mares

## 2016-06-17 NOTE — Clinical Social Work Placement (Signed)
CLINICAL SOCIAL WORK PLACEMENT  NOTE  Date:  06/17/2016  Patient Details  Name: Anthony Holder MRN: [id number] Date of Birth: 01/27/1941  Clinical Social Work is seeking post-discharge placement for this patient at the Skilled  Nursing Facility level of care (*CSW will initial, date and re-position this form in  chart as items are completed):  Yes   Patient/family provided with [facility] Clinical Social Work Department's list of facilities offering this level of care within the geographic area requested by the patient (or if unable, by the patient's family).  Yes   Patient/family informed of their freedom to choose among providers that offer the needed level of care, that participate in Medicare, Medicaid or managed care program needed by the patient, have an available bed and are willing to accept the patient.  Yes   Patient/family informed of [facility]'s ownership interest in [facility] and [facility], as well as of the fact that they are under no obligation to receive care at these facilities.  PASRR submitted to EDS on 06/16/16     PASRR number received on 06/16/16     Existing PASRR number confirmed on       FL2 transmitted to all facilities in geographic area requested by pt/family on 06/16/16     FL2 transmitted to all facilities within larger geographic area on       Patient informed that his/her managed care company has contracts with or will negotiate with certain facilities, including the following:        Yes   Patient/family informed of bed offers received.  Patient chooses bed at [facility]     Physician recommends and patient chooses bed at      Patient to be transferred to [facility] on 06/17/16.  Patient to be transferred to facility by Ambulance     Patient family notified on 06/17/16 of transfer.  Name of family member notified:  Bonita Quin     PHYSICIAN Please sign FL2, Please prepare priority discharge  summary, including medications, Please prepare prescriptions     Additional Comment:   Per MD patient ready for DC to [facility]. RN, patient, patient's family, and facility notified of DC. RN given number for report. DC packet on chart. Ambulance transport requested for patient for next available pickup. CSW signing off.  _______________________________________________ Venita Lick, LCSW 06/17/2016, 1:52 PM

## 2016-06-17 NOTE — Care Management Note (Signed)
Case Management Note  Patient Details  Name: Pellegrino Baine MRN: [id number] Date of Birth: 11-04-1941  Subjective/Objective:   75 y.o. M admitted 06/03/2016 for Rt Transmetatarsal Amputation 06/11/2016.                  Action/Plan: Anticipate discharge to [facility] SNF today. No further CM needs but will be available should additional discharge needs arise.   Expected Discharge Date:                  Expected Discharge Plan:  Skilled Nursing Facility  In-House Referral:  NA, Clinical Social Work  Discharge planning Services  CM Consult  Post Acute Care Choice:    Choice offered to:  Patient, Spouse  DME Arranged:  N/A DME Agency:  NA  HH Arranged:    [place] Agency:     Status of Service:  Completed, signed off  If discussed at [place] of Stay Meetings, dates discussed: 06/17/2016   Additional Comments:  Delrae Sawyers, RN 06/17/2016, 10:03 AM

## 2016-06-17 NOTE — Progress Notes (Signed)
Patient ID: Anthony Holder, male   DOB: February 18, 1941, 75 y.o.   MRN: [id number] Right metatarsal dictation remained stable. Does continue to have a dusky dusky area over the lateral aspect but this remains unchanged. Feel that he is okay for transfer to rehabilitation skilled nursing facility at any point possibly today. I will see him again in one week for follow-up

## 2016-06-18 DIAGNOSIS — I739 Peripheral vascular disease, unspecified: Secondary | ICD-10-CM | POA: Diagnosis not present

## 2016-06-18 DIAGNOSIS — I10 Essential (primary) hypertension: Secondary | ICD-10-CM | POA: Diagnosis not present

## 2016-06-18 DIAGNOSIS — I509 Heart failure, unspecified: Secondary | ICD-10-CM | POA: Diagnosis not present

## 2016-06-18 DIAGNOSIS — L03119 Cellulitis of unspecified part of limb: Secondary | ICD-10-CM | POA: Diagnosis not present

## 2016-06-24 ENCOUNTER — Encounter: Payer: Self-pay | Admitting: Vascular Surgery

## 2016-06-24 DIAGNOSIS — Z79899 Other long term (current) drug therapy: Secondary | ICD-10-CM | POA: Diagnosis not present

## 2016-06-25 ENCOUNTER — Ambulatory Visit: Payer: Medicare Other | Admitting: Vascular Surgery

## 2016-06-25 ENCOUNTER — Ambulatory Visit (INDEPENDENT_AMBULATORY_CARE_PROVIDER_SITE_OTHER): Payer: Medicare Other | Admitting: Vascular Surgery

## 2016-06-25 ENCOUNTER — Encounter: Payer: Self-pay | Admitting: Vascular Surgery

## 2016-06-25 VITALS — BP 135/78 | HR 78 | Temp 97.1°F | Ht 70.0 in | Wt 187.0 lb

## 2016-06-25 DIAGNOSIS — I7025 Atherosclerosis of native arteries of other extremities with ulceration: Secondary | ICD-10-CM

## 2016-06-25 MED ORDER — CEPHALEXIN 500 MG PO CAPS: 500.0000 mg | ORAL_CAPSULE | Freq: Three times a day (TID) | ORAL | Status: DC

## 2016-06-25 NOTE — Progress Notes (Signed)
Patient name: Anthony Holder MRN: [id number] DOB: 1941/05/24 Sex: male  REASON FOR VISIT: Follow up after right transmetatarsal amputation.  HPI: Anthony Holder is a 75 y.o. male one went atherectomy and balloon angioplasty of the right peroneal artery and tibial peroneal trunk. In addition he had atherectomy and angioplasty of the right popliteal and superficial femoral artery. This was performed by [name] on 06/09/2016. I performed a right transmetatarsal amputation on 06/11/2016. He comes in for an outpatient visit. This is actually a patient of [name].   He is at [facility]  He has been ambulating with his [place] shoe. He denies fever or chills.  Current Outpatient Prescriptions  Medication Sig Dispense Refill  . aspirin 81 MG tablet Take 81 mg by mouth daily.    . baclofen (LIORESAL) 10 MG tablet Take 1 tablet (10 mg total) by mouth daily. 90 tablet 1  . carvedilol (COREG) 12.5 MG tablet Take 1 tablet (12.5 mg total) by mouth 2 (two) times daily with a meal. 60 tablet 0  . Cholecalciferol (VITAMIN D-3) 1000 UNITS CAPS Take 5,000 Units by mouth daily.     [name] CINNAMON PO Take 3 tablets by mouth 2 (two) times daily.    [name] doxycycline (VIBRA-TABS) 100 MG tablet Take 1 tablet (100 mg total) by mouth every 12 (twelve) hours. 28 tablet 0  . ferrous sulfate 325 (65 FE) MG tablet Take 1 tablet (325 mg total) by mouth every other day. 30 tablet 0  . fish oil-omega-3 fatty acids 1000 MG capsule Take 1 g by mouth 2 (two) times daily.    . furosemide (LASIX) 40 MG tablet TAKE ONE TABLET BY MOUTH  TWICE DAILY FOR BLOOD  PRESSURE AND FLUID. (Patient taking differently: TAKE ONE TABLET BY MOUTH TWICE DAILY FOR BLOOD  PRESSURE AND FLUID.) 180 tablet 1  . gabapentin (NEURONTIN) 300 MG capsule Take 1 capsule (300 mg total) by mouth 3 (three) times daily. 270 capsule 4  . insulin lispro (HUMALOG) 100 UNIT/ML cartridge Before each meal 3 times a day, 140-199 - 2 units, 200-250 - 4  units, 251-299 - 6 units,  300-349 - 8 units,  350 or above 10 units. Insulin PEN if approved, provide syringes and needles if needed. 15 mL 0  . insulin regular (HUMULIN R) 100 units/mL injection Before each meal 3 times a day, 140-199 - 2 units, 200-250 - 4 units, 251-299 - 6 units,  300-349 - 8 units,  350 or above 10 units. Insulin PEN if approved, provide syringes and needles if needed. 10 mL 0  . metFORMIN (GLUCOPHAGE-XR) 500 MG 24 hr tablet Take 1,000 mg by mouth 2 (two) times daily.     . nitroGLYCERIN (NITROSTAT) 0.4 MG SL tablet Place 1 tablet (0.4 mg total) under the tongue every 5 (five) minutes as needed for chest pain. 25 tablet 11  . oxyCODONE-acetaminophen (PERCOCET/ROXICET) 5-325 MG tablet Take 1 tablet by mouth every 8 (eight) hours as needed for severe pain. 10 tablet 0  . potassium chloride SA (K-DUR,KLOR-CON) 20 MEQ tablet Take 1 tablet (20 mEq total) by mouth daily. 90 tablet 3  . Probiotic Product (PROBIOTIC PO) Take 1 capsule by mouth 2 (two) times daily.     [name] senna-docusate (SENOKOT-S) 8.6-50 MG tablet Take 1 tablet by mouth at bedtime. 30 tablet 0  . tamsulosin (FLOMAX) 0.4 MG CAPS capsule Take 1 capsule (0.4 mg total) by mouth daily after breakfast. 30 capsule 0  . traMADol (ULTRAM) 50  MG tablet Take 1 tablet (50 mg total) by mouth every 6 (six) hours as needed for moderate pain. 15 tablet 0  . vitamin B-12 (CYANOCOBALAMIN) 500 MCG tablet Take 500 mcg by mouth daily.     . vitamin C (ASCORBIC ACID) 500 MG tablet Take 500 mg by mouth daily.     [name] VITAMIN E PO Take 1 tablet by mouth daily.     No current facility-administered medications for this visit.    REVIEW OF SYSTEMS:  [X]  denotes positive finding, [ ]  denotes negative finding Cardiac  Comments:  Chest pain or chest pressure:    Shortness of breath upon exertion:    Short of breath when lying flat:    Irregular heart rhythm:    Constitutional    Fever or chills:      PHYSICAL EXAM: Filed Vitals:    06/25/16 1056  BP: 135/78  Pulse: 78  Temp: 97.1 F (36.2 C)  TempSrc: Oral  Height: 5\' 10"  (1.778 m)  Weight: 187 lb (84.823 kg)    GENERAL: The patient is a well-nourished male, in no acute distress. The vital signs are documented above. CARDIOVASCULAR: There is a regular rate and rhythm. PULMONARY: There is good air exchange bilaterally without wheezing or rales. He has a palpable popliteal pulse. His right transmetatarsal amputation site is healing adequately. On the lateral aspect of the TMA, there was an eschar which has resolved. There were several staples which were really were not holding any tissue which I removed. He has some mild erythema on the flap. He also has a small eschar on his right heel.  MEDICAL ISSUES:  STATUS POST RIGHT TRANSMETATARSAL AMPUTATION ON 06/11/2016: This is healing adequately, however, he is still not out of the woods completely. I have written him a prescription for Keflex given the persistent cellulitis on the flap. I written instructions for [facility] to again daily dressing changes to the lateral aspect of the transmetatarsal amputation. I was going to bring him back to see me on July 19 for staple removal but he cannot come on that day so we will have him see [name] on the 18th. I've instructed him to keep pressure off of the heel by elevating the leg on a pillow. We will have to keep an eye on this small heel ulcer.  [name] [facility] [place] [phone]

## 2016-07-01 ENCOUNTER — Encounter: Payer: Self-pay | Admitting: Vascular Surgery

## 2016-07-02 ENCOUNTER — Other Ambulatory Visit: Payer: Self-pay | Admitting: *Deleted

## 2016-07-02 DIAGNOSIS — T814XXD Infection following a procedure, subsequent encounter: Secondary | ICD-10-CM

## 2016-07-02 DIAGNOSIS — IMO0001 Reserved for inherently not codable concepts without codable children: Secondary | ICD-10-CM

## 2016-07-02 DIAGNOSIS — Z89431 Acquired absence of right foot: Secondary | ICD-10-CM

## 2016-07-02 DIAGNOSIS — R5381 Other malaise: Secondary | ICD-10-CM

## 2016-07-06 ENCOUNTER — Ambulatory Visit (INDEPENDENT_AMBULATORY_CARE_PROVIDER_SITE_OTHER): Payer: Medicare Other | Admitting: Vascular Surgery

## 2016-07-06 ENCOUNTER — Encounter: Payer: Self-pay | Admitting: Vascular Surgery

## 2016-07-06 VITALS — BP 125/71 | HR 67 | Temp 94.5°F | Resp 16 | Ht 70.0 in | Wt 187.0 lb

## 2016-07-06 DIAGNOSIS — I7025 Atherosclerosis of native arteries of other extremities with ulceration: Secondary | ICD-10-CM

## 2016-07-06 NOTE — Progress Notes (Signed)
Patient name: Anthony Holder MRN: [id number] DOB: Oct 27, 1941 Sex: male  REASON FOR VISIT: Here today for continued follow-up of right metatarsal tarsal limitation with Dr. Scot Dock on 06/11/2016. He had undergone atherectomy of superficial femoral artery and tibial angioplasty with Dr. Trula Slade several days prior to this. Patient has a left below-knee amputation and wanted all efforts possible for limb salvage  HPI: Anthony Holder is a 75 y.o. male he has done well in the rehabilitation and is now staying with his granddaughter. He is here today with his wife. Minimal pain. He is walking with a [place] shoe  Current Outpatient Prescriptions  Medication Sig Dispense Refill  . aspirin 81 MG tablet Take 81 mg by mouth daily.    . baclofen (LIORESAL) 10 MG tablet Take 1 tablet (10 mg total) by mouth daily. 90 tablet 1  . carvedilol (COREG) 12.5 MG tablet Take 1 tablet (12.5 mg total) by mouth 2 (two) times daily with a meal. 60 tablet 0  . Cholecalciferol (VITAMIN D-3) 1000 UNITS CAPS Take 5,000 Units by mouth daily.     [name] CINNAMON PO Take 3 tablets by mouth 2 (two) times daily.    [name] doxycycline (VIBRA-TABS) 100 MG tablet Take 1 tablet (100 mg total) by mouth every 12 (twelve) hours. 28 tablet 0  . ferrous sulfate 325 (65 FE) MG tablet Take 1 tablet (325 mg total) by mouth every other day. 30 tablet 0  . fish oil-omega-3 fatty acids 1000 MG capsule Take 1 g by mouth 2 (two) times daily.    . furosemide (LASIX) 40 MG tablet TAKE ONE TABLET BY MOUTH  TWICE DAILY FOR BLOOD  PRESSURE AND FLUID. (Patient taking differently: TAKE ONE TABLET BY MOUTH TWICE DAILY FOR BLOOD  PRESSURE AND FLUID.) 180 tablet 1  . gabapentin (NEURONTIN) 300 MG capsule Take 1 capsule (300 mg total) by mouth 3 (three) times daily. 270 capsule 4  . insulin lispro (HUMALOG) 100 UNIT/ML cartridge Before each meal 3 times a day, 140-199 - 2 units, 200-250 - 4 units, 251-299 - 6 units,  300-349 - 8  units,  350 or above 10 units. Insulin PEN if approved, provide syringes and needles if needed. 15 mL 0  . insulin regular (HUMULIN R) 100 units/mL injection Before each meal 3 times a day, 140-199 - 2 units, 200-250 - 4 units, 251-299 - 6 units,  300-349 - 8 units,  350 or above 10 units. Insulin PEN if approved, provide syringes and needles if needed. 10 mL 0  . metFORMIN (GLUCOPHAGE-XR) 500 MG 24 hr tablet Take 1,000 mg by mouth 2 (two) times daily.     . nitroGLYCERIN (NITROSTAT) 0.4 MG SL tablet Place 1 tablet (0.4 mg total) under the tongue every 5 (five) minutes as needed for chest pain. 25 tablet 11  . oxyCODONE-acetaminophen (PERCOCET/ROXICET) 5-325 MG tablet Take 1 tablet by mouth every 8 (eight) hours as needed for severe pain. 10 tablet 0  . potassium chloride SA (K-DUR,KLOR-CON) 20 MEQ tablet Take 1 tablet (20 mEq total) by mouth daily. 90 tablet 3  . Probiotic Product (PROBIOTIC PO) Take 1 capsule by mouth 2 (two) times daily.     [name] senna-docusate (SENOKOT-S) 8.6-50 MG tablet Take 1 tablet by mouth at bedtime. 30 tablet 0  . tamsulosin (FLOMAX) 0.4 MG CAPS capsule Take 1 capsule (0.4 mg total) by mouth daily after breakfast. 30 capsule 0  . traMADol (ULTRAM) 50 MG tablet Take 1 tablet (50 mg total) by mouth every  6 (six) hours as needed for moderate pain. 15 tablet 0  . vitamin B-12 (CYANOCOBALAMIN) 500 MCG tablet Take 500 mcg by mouth daily.     . vitamin C (ASCORBIC ACID) 500 MG tablet Take 500 mg by mouth daily.     [name] VITAMIN E PO Take 1 tablet by mouth daily.    . cephALEXin (KEFLEX) 500 MG capsule Take 1 capsule (500 mg total) by mouth 3 (three) times daily. (Patient not taking: Reported on 07/06/2016) 42 capsule 1   No current facility-administered medications for this visit.      PHYSICAL EXAM: Filed Vitals:   07/06/16 1024 07/06/16 1030  BP: 122/78 125/71  Pulse: 67   Temp: 94.5 F (34.7 C)   TempSrc: Oral   Resp: 16   Height: 5\' 10"  (1.778 m)   Weight: 187 lb  (84.823 kg)   SpO2: 100%     GENERAL: The patient is a well-nourished male, in no acute distress. The vital signs are documented above. Transmetatarsal education was quite good today. His heel but two thirds are well-healed. On the lateral aspect does have an eschar present with no evidence of fluctuance  MEDICAL ISSUES: Doing well overall. We'll continue normal saline wet-to-dry dressings on a daily basis to the lateral aspect of this. Will not remove the staples today. We'll see him again in 2 weeks for full further follow-up and staple removal  Rosetta Posner, MD [facility] Tel [phone] Pager [phone]

## 2016-07-08 ENCOUNTER — Ambulatory Visit (INDEPENDENT_AMBULATORY_CARE_PROVIDER_SITE_OTHER): Payer: Medicare Other | Admitting: Internal Medicine

## 2016-07-08 ENCOUNTER — Encounter: Payer: Self-pay | Admitting: Internal Medicine

## 2016-07-08 VITALS — BP 110/72 | HR 64 | Temp 97.9°F | Resp 16 | Ht 69.5 in | Wt 187.0 lb

## 2016-07-08 DIAGNOSIS — N182 Chronic kidney disease, stage 2 (mild): Secondary | ICD-10-CM

## 2016-07-08 DIAGNOSIS — Z125 Encounter for screening for malignant neoplasm of prostate: Secondary | ICD-10-CM

## 2016-07-08 DIAGNOSIS — Z951 Presence of aortocoronary bypass graft: Secondary | ICD-10-CM

## 2016-07-08 DIAGNOSIS — E559 Vitamin D deficiency, unspecified: Secondary | ICD-10-CM | POA: Diagnosis not present

## 2016-07-08 DIAGNOSIS — I10 Essential (primary) hypertension: Secondary | ICD-10-CM

## 2016-07-08 DIAGNOSIS — Z1212 Encounter for screening for malignant neoplasm of rectum: Secondary | ICD-10-CM

## 2016-07-08 DIAGNOSIS — I350 Nonrheumatic aortic (valve) stenosis: Secondary | ICD-10-CM

## 2016-07-08 DIAGNOSIS — E0841 Diabetes mellitus due to underlying condition with diabetic mononeuropathy: Secondary | ICD-10-CM

## 2016-07-08 DIAGNOSIS — Z79899 Other long term (current) drug therapy: Secondary | ICD-10-CM

## 2016-07-08 DIAGNOSIS — Z0000 Encounter for general adult medical examination without abnormal findings: Secondary | ICD-10-CM | POA: Diagnosis not present

## 2016-07-08 DIAGNOSIS — Z136 Encounter for screening for cardiovascular disorders: Secondary | ICD-10-CM

## 2016-07-08 DIAGNOSIS — E1122 Type 2 diabetes mellitus with diabetic chronic kidney disease: Secondary | ICD-10-CM | POA: Diagnosis not present

## 2016-07-08 DIAGNOSIS — Z794 Long term (current) use of insulin: Secondary | ICD-10-CM

## 2016-07-08 DIAGNOSIS — E782 Mixed hyperlipidemia: Secondary | ICD-10-CM | POA: Diagnosis not present

## 2016-07-08 DIAGNOSIS — Z0001 Encounter for general adult medical examination with abnormal findings: Secondary | ICD-10-CM

## 2016-07-08 LAB — BASIC METABOLIC PANEL WITH GFR
BUN: 29 mg/dL — AB (ref 7–25)
CHLORIDE: 94 mmol/L — AB (ref 98–110)
CO2: 27 mmol/L (ref 20–31)
CREATININE: 1.17 mg/dL (ref 0.70–1.18)
Calcium: 9.5 mg/dL (ref 8.6–10.3)
GFR, Est African American: 71 mL/min (ref >=60)
GFR, Est Non African American: 61 mL/min (ref >=60)
Glucose, Bld: 200 mg/dL — ABNORMAL HIGH (ref 65–99)
POTASSIUM: 4 mmol/L (ref 3.5–5.3)
SODIUM: 134 mmol/L — AB (ref 135–146)

## 2016-07-08 LAB — TSH: TSH: 2.2 m[IU]/L (ref 0.40–4.50)

## 2016-07-08 LAB — HEPATIC FUNCTION PANEL
ALK PHOS: 58 U/L (ref 40–115)
ALT: 11 U/L (ref 9–46)
AST: 15 U/L (ref 10–35)
Albumin: 3.8 g/dL (ref 3.6–5.1)
BILIRUBIN DIRECT: 0.1 mg/dL (ref <=0.2)
BILIRUBIN TOTAL: 0.5 mg/dL (ref 0.2–1.2)
Indirect Bilirubin: 0.4 mg/dL (ref 0.2–1.2)
Total Protein: 7.9 g/dL (ref 6.1–8.1)

## 2016-07-08 LAB — CBC WITH DIFFERENTIAL/PLATELET
BASOS ABS: 117 {cells}/uL (ref 0–200)
BASOS PCT: 1 %
EOS ABS: 351 {cells}/uL (ref 15–500)
Eosinophils Relative: 3 %
HCT: 38.6 % (ref 38.5–50.0)
HEMOGLOBIN: 12.5 g/dL — AB (ref 13.2–17.1)
LYMPHS ABS: 2223 {cells}/uL (ref 850–3900)
Lymphocytes Relative: 19 %
MCH: 28 pg (ref 27.0–33.0)
MCHC: 32.4 g/dL (ref 32.0–36.0)
MCV: 86.4 fL (ref 80.0–100.0)
MPV: 9.1 fL (ref 7.5–12.5)
Monocytes Absolute: 1170 cells/uL — ABNORMAL HIGH (ref 200–950)
Monocytes Relative: 10 %
NEUTROS ABS: 7839 {cells}/uL — AB (ref 1500–7800)
Neutrophils Relative %: 67 %
PLATELETS: 255 10*3/uL (ref 140–400)
RBC: 4.47 MIL/uL (ref 4.20–5.80)
RDW: 15.2 % — ABNORMAL HIGH (ref 11.0–15.0)
WBC: 11.7 10*3/uL — ABNORMAL HIGH (ref 3.8–10.8)

## 2016-07-08 LAB — LIPID PANEL
CHOL/HDL RATIO: 7.9 ratio — AB (ref <=5.0)
CHOLESTEROL: 220 mg/dL — AB (ref 125–200)
HDL: 28 mg/dL — AB (ref >=40)
LDL Cholesterol: 148 mg/dL — ABNORMAL HIGH (ref <130)
Triglycerides: 219 mg/dL — ABNORMAL HIGH (ref <150)
VLDL: 44 mg/dL — AB (ref <30)

## 2016-07-08 LAB — MAGNESIUM: Magnesium: 1.6 mg/dL (ref 1.5–2.5)

## 2016-07-08 NOTE — Patient Instructions (Signed)
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
## 2016-07-09 ENCOUNTER — Other Ambulatory Visit: Payer: Self-pay | Admitting: Internal Medicine

## 2016-07-09 DIAGNOSIS — E785 Hyperlipidemia, unspecified: Secondary | ICD-10-CM

## 2016-07-09 DIAGNOSIS — I10 Essential (primary) hypertension: Secondary | ICD-10-CM

## 2016-07-09 LAB — URINALYSIS, ROUTINE W REFLEX MICROSCOPIC
Bilirubin Urine: NEGATIVE
Glucose, UA: NEGATIVE
Hgb urine dipstick: NEGATIVE
Ketones, ur: NEGATIVE
LEUKOCYTES UA: NEGATIVE
Nitrite: NEGATIVE
SPECIFIC GRAVITY, URINE: 1.02 (ref 1.001–1.035)
pH: 5 (ref 5.0–8.0)

## 2016-07-09 LAB — URINALYSIS, MICROSCOPIC ONLY
BACTERIA UA: NONE SEEN [HPF]
Casts: NONE SEEN [LPF]
Crystals: NONE SEEN [HPF]
RBC / HPF: NONE SEEN RBC/HPF (ref <=2)
Squamous Epithelial / LPF: NONE SEEN [HPF] (ref <=5)
WBC UA: NONE SEEN WBC/HPF (ref <=5)
Yeast: NONE SEEN [HPF]

## 2016-07-09 LAB — MICROALBUMIN / CREATININE URINE RATIO
Creatinine, Urine: 105 mg/dL (ref 20–370)
MICROALB/CREAT RATIO: 566 ug/mg{creat} — AB (ref <30)
Microalb, Ur: 59.4 mg/dL

## 2016-07-09 LAB — PSA: PSA: 0.58 ng/mL (ref <=4.00)

## 2016-07-09 LAB — VITAMIN D 25 HYDROXY (VIT D DEFICIENCY, FRACTURES): VIT D 25 HYDROXY: 67 ng/mL (ref 30–100)

## 2016-07-09 MED ORDER — EZETIMIBE 10 MG PO TABS: 10.0000 mg | ORAL_TABLET | Freq: Every day | ORAL | Status: AC

## 2016-07-09 MED ORDER — FUROSEMIDE 40 MG PO TABS: ORAL_TABLET | ORAL | Status: AC
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
## 2016-07-22 ENCOUNTER — Encounter: Payer: Self-pay | Admitting: Vascular Surgery

## 2016-07-22 ENCOUNTER — Ambulatory Visit (INDEPENDENT_AMBULATORY_CARE_PROVIDER_SITE_OTHER): Payer: Medicare Other | Admitting: Cardiology

## 2016-07-22 ENCOUNTER — Encounter: Payer: Self-pay | Admitting: Cardiology

## 2016-07-22 VITALS — BP 98/64 | HR 68 | Ht 69.0 in | Wt 184.0 lb

## 2016-07-22 DIAGNOSIS — I2510 Atherosclerotic heart disease of native coronary artery without angina pectoris: Secondary | ICD-10-CM

## 2016-07-22 DIAGNOSIS — I255 Ischemic cardiomyopathy: Secondary | ICD-10-CM | POA: Diagnosis not present

## 2016-07-22 DIAGNOSIS — I350 Nonrheumatic aortic (valve) stenosis: Secondary | ICD-10-CM

## 2016-07-22 NOTE — Patient Instructions (Signed)
Medication Instructions:  Continue current medications  Labwork: NONE  Testing/Procedures: NONE  Follow-Up: Your physician recommends that you schedule a follow-up appointment: Anthony Holder in [place] physician recommends that you schedule a follow-up appointment in: 3 Months   Any Other Special Instructions Will Be Listed Below (If Applicable).   If you need a refill on your cardiac medications before your next appointment, please call your pharmacy.

## 2016-07-27 ENCOUNTER — Ambulatory Visit (INDEPENDENT_AMBULATORY_CARE_PROVIDER_SITE_OTHER): Payer: Medicare Other | Admitting: Vascular Surgery

## 2016-07-27 ENCOUNTER — Encounter: Payer: Self-pay | Admitting: Vascular Surgery

## 2016-07-27 VITALS — BP 109/73 | HR 64 | Temp 97.5°F | Resp 18 | Ht 69.0 in | Wt 184.0 lb

## 2016-07-27 DIAGNOSIS — I7025 Atherosclerosis of native arteries of other extremities with ulceration: Secondary | ICD-10-CM

## 2016-07-27 NOTE — Progress Notes (Signed)
Patient name: Anthony Holder MRN: [id number] DOB: 1941-04-29 Sex: male  REASON FOR VISIT: Follow-up right transmetatarsal amputation  HPI: Anthony Holder is a 75 y.o. male here for a follow-up after extensive endovascular treatment of occlusive disease and right transmetatarsal amputation  Current Outpatient Prescriptions  Medication Sig Dispense Refill  . aspirin 81 MG tablet Take 81 mg by mouth daily.    . baclofen (LIORESAL) 10 MG tablet Take 1 tablet (10 mg total) by mouth daily. 90 tablet 1  . carvedilol (COREG) 12.5 MG tablet Take 1 tablet (12.5 mg total) by mouth 2 (two) times daily with a meal. 60 tablet 0  . cephALEXin (KEFLEX) 500 MG capsule Take 1 capsule (500 mg total) by mouth 3 (three) times daily. 42 capsule 1  . Cholecalciferol (VITAMIN D-3) 1000 UNITS CAPS Take 5,000 Units by mouth daily.     [name] CINNAMON PO Take 3 tablets by mouth 2 (two) times daily.    [name] ezetimibe (ZETIA) 10 MG tablet Take 1 tablet (10 mg total) by mouth daily. 90 tablet 1  . ferrous sulfate 325 (65 FE) MG tablet Take 1 tablet (325 mg total) by mouth every other day. 30 tablet 0  . fish oil-omega-3 fatty acids 1000 MG capsule Take 1 g by mouth 2 (two) times daily.    . furosemide (LASIX) 40 MG tablet Take 1 tablet 2 x/ day for BP & Fluid 180 tablet 1  . gabapentin (NEURONTIN) 300 MG capsule Take 1 capsule (300 mg total) by mouth 3 (three) times daily. 270 capsule 4  . insulin lispro (HUMALOG) 100 UNIT/ML cartridge Before each meal 3 times a day, 140-199 - 2 units, 200-250 - 4 units, 251-299 - 6 units,  300-349 - 8 units,  350 or above 10 units. Insulin PEN if approved, provide syringes and needles if needed. 15 mL 0  . insulin NPH Human (NOVOLIN N RELION) 100 UNIT/ML injection Inject 100 Units into the skin. Patient states he takes 18 units at night and does not take in the AM unless glucose is above 150.    [name] insulin regular (HUMULIN R) 100 units/mL injection Before each  meal 3 times a day, 140-199 - 2 units, 200-250 - 4 units, 251-299 - 6 units,  300-349 - 8 units,  350 or above 10 units. Insulin PEN if approved, provide syringes and needles if needed. 10 mL 0  . metFORMIN (GLUCOPHAGE-XR) 500 MG 24 hr tablet Take 1,000 mg by mouth 2 (two) times daily.     . nitroGLYCERIN (NITROSTAT) 0.4 MG SL tablet Place 1 tablet (0.4 mg total) under the tongue every 5 (five) minutes as needed for chest pain. 25 tablet 11  . potassium chloride SA (K-DUR,KLOR-CON) 20 MEQ tablet Take 1 tablet (20 mEq total) by mouth daily. 90 tablet 3  . Probiotic Product (PROBIOTIC PO) Take 1 capsule by mouth 2 (two) times daily.     [name] senna-docusate (SENOKOT-S) 8.6-50 MG tablet Take 1 tablet by mouth at bedtime. 30 tablet 0  . tamsulosin (FLOMAX) 0.4 MG CAPS capsule Take 1 capsule (0.4 mg total) by mouth daily after breakfast. 30 capsule 0  . traMADol (ULTRAM) 50 MG tablet Take 1 tablet (50 mg total) by mouth every 6 (six) hours as needed for moderate pain. 15 tablet 0  . vitamin B-12 (CYANOCOBALAMIN) 500 MCG tablet Take 500 mcg by mouth daily.     . vitamin C (ASCORBIC ACID) 500 MG tablet Take 500 mg by mouth daily.     [name]  VITAMIN E PO Take 1 tablet by mouth daily.     No current facility-administered medications for this visit.      PHYSICAL EXAM: Vitals:   07/27/16 1210  BP: 109/73  Pulse: 64  Resp: 18  Temp: 97.5 F (36.4 C)  TempSrc: Oral  SpO2: 100%  Weight: 184 lb (83.5 kg)  Height: 5\' 9"  (1.753 m)    GENERAL: The patient is a well-nourished male, in no acute distress. The vital signs are documented above. Staples were removed. Patient does have full-thickness loss on the lateral third of the transmetatarsal amputation site. This was all debrided. The remainder does appear to be healing.  MEDICAL ISSUES: Continue local wound cares and see him back again in 2 weeks for continued follow-up. He has wife understand that still at risk for nonhealing although the medial two  thirds appear to be doing quite nicely.   Rosetta Posner, MD FACS [facility] Tel [phone] Pager [phone]

## 2016-07-29 ENCOUNTER — Other Ambulatory Visit: Payer: Self-pay | Admitting: *Deleted

## 2016-07-29 ENCOUNTER — Encounter: Payer: Self-pay | Admitting: Pharmacist Clinician (PhC)/ Clinical Pharmacy Specialist

## 2016-07-29 ENCOUNTER — Ambulatory Visit (INDEPENDENT_AMBULATORY_CARE_PROVIDER_SITE_OTHER): Payer: Medicare Other | Admitting: Pharmacist Clinician (PhC)/ Clinical Pharmacy Specialist

## 2016-07-29 DIAGNOSIS — E785 Hyperlipidemia, unspecified: Secondary | ICD-10-CM

## 2016-07-29 DIAGNOSIS — E782 Mixed hyperlipidemia: Secondary | ICD-10-CM

## 2016-07-29 MED ORDER — EZETIMIBE 10 MG PO TABS: 10.0000 mg | ORAL_TABLET | Freq: Every day | ORAL | 3 refills | Status: DC
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
## 2016-07-29 NOTE — Assessment & Plan Note (Signed)
We were able to find a cheaper price in his ezetimbe at [facility] in [place].  He will get that and try for 90 days.  After 2 months will repeat his cholesterol labs.  We could submit paperwork to his insurance for [place], but with his difficulties in paying large copays, I suspect this too will be cost prohibitive.  Manufacturer assistance has been available, but the turn around time on those claims is running at least 4-6 weeks right now.

## 2016-07-29 NOTE — Patient Instructions (Signed)
Get the ezetimbe filled if you are able and take 1 tablet daily.  Does not matter if it is with food or without.  Repeat cholesterol labs about 2 months after starting this (in October)  We will put in request to your insurance company for the Stella for now.   Cholesterol Cholesterol is a fat. Your body needs a small amount of cholesterol. Cholesterol may build up in your blood vessels. This increases your chance of having a heart attack or stroke. You cannot feel your cholesterol levels. The only way to know your cholesterol level is high is with a blood test. Keep your test results. Work with your doctor to keep your cholesterol at a good level. WHAT DO THE TEST RESULTS MEAN?  Total cholesterol is how much cholesterol is in your blood.  LDL is bad cholesterol. This is the type that can build up. You want LDL to be low.  HDL is good cholesterol. It cleans your blood vessels and carries LDL away. You want HDL to be high.  Triglycerides are fat that the body can burn for energy or store. WHAT ARE GOOD LEVELS OF CHOLESTEROL?  Total cholesterol below 200.  LDL below 100 for people at risk. Below 70 for those at very high risk.  HDL above 50 is good. Above 60 is best.  Triglycerides below 150. HOW CAN I LOWER MY CHOLESTEROL?  Diet. Follow your diet programs as told by your doctor.  Choose fish, white meat chicken, roasted [place], or baked [place]. Try not to eat red meat, fried foods, or processed meats such as sausage and lunch meats.  Eat lots of fresh fruits and vegetables.  Choose whole grains, beans, pasta, potatoes, and cereals.  Use only small amounts of olive, corn, or canola oils.  Try not to eat butter, mayonnaise, shortening, or palm kernel oils.  Try not to eat foods with trans fats.  Drink skim or nonfat milk. Eat low-fat or nonfat yogurt and cheeses. Try not to drink whole milk or cream. Try not to eat ice cream, egg yolks, and full-fat cheeses.  Healthy  desserts include angel food cake, ginger snaps, animal crackers, hard candy, popsicles, and low-fat or nonfat frozen yogurt. Try not to eat pastries, cakes, pies, and cookies.  Exercise. Follow your exercise programs as told by your doctor.  Be more active. You can try gardening, walking, or taking the stairs. Ask your doctor about how you can be more active.  Medicine. Take medicine as told by your doctor.   This information is not intended to replace advice given to you by your health care provider. Make sure you discuss any questions you have with your health care provider.   Document Released: 03/04/2009 Document Revised: 12/27/2014 Document Reviewed: 09/19/2013 Elsevier Interactive Patient Education [employer].

## 2016-08-09 ENCOUNTER — Encounter: Payer: Self-pay | Admitting: Vascular Surgery

## 2016-08-10 ENCOUNTER — Encounter: Payer: Self-pay | Admitting: Vascular Surgery

## 2016-08-10 ENCOUNTER — Ambulatory Visit (INDEPENDENT_AMBULATORY_CARE_PROVIDER_SITE_OTHER): Payer: Medicare Other | Admitting: Vascular Surgery

## 2016-08-10 VITALS — BP 97/63 | HR 76 | Temp 97.6°F | Resp 20 | Ht 69.0 in | Wt 189.0 lb

## 2016-08-10 DIAGNOSIS — I7025 Atherosclerosis of native arteries of other extremities with ulceration: Secondary | ICD-10-CM

## 2016-08-10 NOTE — Progress Notes (Signed)
Patient name: Anthony Holder MRN: [id number] DOB: August 15, 1941 Sex: male  REASON FOR VISIT: Follow-up right transmetatarsal amputation by Dr. Scot Dock on 06/11/2016  HPI: Kevinn Tseng is a 75 y.o. male here today for continued follow-up. Has been walking with the Darco shoe and his contralateral prosthesis.  Current Outpatient Prescriptions  Medication Sig Dispense Refill  . aspirin 81 MG tablet Take 81 mg by mouth daily.    . baclofen (LIORESAL) 10 MG tablet Take 1 tablet (10 mg total) by mouth daily. 90 tablet 1  . carvedilol (COREG) 12.5 MG tablet Take 1 tablet (12.5 mg total) by mouth 2 (two) times daily with a meal. 60 tablet 0  . cephALEXin (KEFLEX) 500 MG capsule Take 1 capsule (500 mg total) by mouth 3 (three) times daily. 42 capsule 1  . Cholecalciferol (VITAMIN D-3) 1000 UNITS CAPS Take 5,000 Units by mouth daily.     [name] CINNAMON PO Take 3 tablets by mouth 2 (two) times daily.    [name] ezetimibe (ZETIA) 10 MG tablet Take 1 tablet (10 mg total) by mouth daily. 90 tablet 1  . ezetimibe (ZETIA) 10 MG tablet Take 1 tablet (10 mg total) by mouth daily. 90 tablet 3  . ferrous sulfate 325 (65 FE) MG tablet Take 1 tablet (325 mg total) by mouth every other day. 30 tablet 0  . fish oil-omega-3 fatty acids 1000 MG capsule Take 1 g by mouth 2 (two) times daily.    . furosemide (LASIX) 40 MG tablet Take 1 tablet 2 x/ day for BP & Fluid 180 tablet 1  . gabapentin (NEURONTIN) 300 MG capsule Take 1 capsule (300 mg total) by mouth 3 (three) times daily. 270 capsule 4  . insulin lispro (HUMALOG) 100 UNIT/ML cartridge Before each meal 3 times a day, 140-199 - 2 units, 200-250 - 4 units, 251-299 - 6 units,  300-349 - 8 units,  350 or above 10 units. Insulin PEN if approved, provide syringes and needles if needed. 15 mL 0  . insulin NPH Human (NOVOLIN N RELION) 100 UNIT/ML injection Inject 100 Units into the skin. Patient states he takes 18 units at night and does not  take in the AM unless glucose is above 150.    [name] insulin regular (HUMULIN R) 100 units/mL injection Before each meal 3 times a day, 140-199 - 2 units, 200-250 - 4 units, 251-299 - 6 units,  300-349 - 8 units,  350 or above 10 units. Insulin PEN if approved, provide syringes and needles if needed. 10 mL 0  . metFORMIN (GLUCOPHAGE-XR) 500 MG 24 hr tablet Take 1,000 mg by mouth 2 (two) times daily.     . nitroGLYCERIN (NITROSTAT) 0.4 MG SL tablet Place 1 tablet (0.4 mg total) under the tongue every 5 (five) minutes as needed for chest pain. 25 tablet 11  . potassium chloride SA (K-DUR,KLOR-CON) 20 MEQ tablet Take 1 tablet (20 mEq total) by mouth daily. 90 tablet 3  . Probiotic Product (PROBIOTIC PO) Take 1 capsule by mouth 2 (two) times daily.     [name] senna-docusate (SENOKOT-S) 8.6-50 MG tablet Take 1 tablet by mouth at bedtime. 30 tablet 0  . tamsulosin (FLOMAX) 0.4 MG CAPS capsule Take 1 capsule (0.4 mg total) by mouth daily after breakfast. 30 capsule 0  . traMADol (ULTRAM) 50 MG tablet Take 1 tablet (50 mg total) by mouth every 6 (six) hours as needed for moderate pain. 15 tablet 0  . vitamin B-12 (CYANOCOBALAMIN) 500 MCG tablet Take  500 mcg by mouth daily.     . vitamin C (ASCORBIC ACID) 500 MG tablet Take 500 mg by mouth daily.     [name] VITAMIN E PO Take 1 tablet by mouth daily.     No current facility-administered medications for this visit.      PHYSICAL EXAM: Vitals:   08/10/16 1102  BP: 97/63  Pulse: 76  Resp: 20  Temp: 97.6 F (36.4 C)  TempSrc: Oral  SpO2: 97%  Weight: 189 lb (85.7 kg)  Height: 5\' 9"  (1.753 m)    GENERAL: The patient is a well-nourished male, in no acute distress. The vital signs are documented above. Does continue to have the the lateral half of his transmetatarsal head amputation with open area. There is fascia exposed but no bone exposed. I debrided this again in the office today. He does have an eschar on the posterior aspect of his right heel but this  appears to be healing without difficulty.  MEDICAL ISSUES: Again discussed with the patient and his wife present the concern about the amount of open wound that he has. We'll continue with debridement in the office. Plan see him again in 2 weeks. He will notify should he develop any worsening in that time. I explained that he certainly is still at significant risk of the below-knee amputation.   Rosetta Posner, MD FACS [facility] Tel [phone] Pager [phone]

## 2016-08-16 DIAGNOSIS — M542 Cervicalgia: Secondary | ICD-10-CM | POA: Diagnosis not present

## 2016-08-17 ENCOUNTER — Encounter: Payer: Self-pay | Admitting: Vascular Surgery

## 2016-08-24 ENCOUNTER — Other Ambulatory Visit: Payer: Self-pay

## 2016-08-24 ENCOUNTER — Ambulatory Visit (INDEPENDENT_AMBULATORY_CARE_PROVIDER_SITE_OTHER): Payer: Medicare Other | Admitting: Vascular Surgery

## 2016-08-24 ENCOUNTER — Encounter: Payer: Self-pay | Admitting: Vascular Surgery

## 2016-08-24 VITALS — BP 129/69 | HR 78 | Temp 97.7°F | Resp 16 | Ht 69.0 in | Wt 194.0 lb

## 2016-08-24 DIAGNOSIS — I7025 Atherosclerosis of native arteries of other extremities with ulceration: Secondary | ICD-10-CM

## 2016-08-24 NOTE — Progress Notes (Signed)
Patient name: Anthony Holder MRN: [id number] DOB: 1941-05-05 Sex: male  REASON FOR VISIT: Follow-up transmetatarsal amputation  HPI: Anthony Holder is a 75 y.o. male here today for continued follow-up of transmetatarsal amputation. His was by with Dr. Scot Dock in 06/11/2016. He had been seen 3 weeks ago. Is here today for follow-up.  Current Outpatient Prescriptions  Medication Sig Dispense Refill  . aspirin 81 MG tablet Take 81 mg by mouth daily.    . baclofen (LIORESAL) 10 MG tablet Take 1 tablet (10 mg total) by mouth daily. 90 tablet 1  . carvedilol (COREG) 12.5 MG tablet Take 1 tablet (12.5 mg total) by mouth 2 (two) times daily with a meal. 60 tablet 0  . cephALEXin (KEFLEX) 500 MG capsule Take 1 capsule (500 mg total) by mouth 3 (three) times daily. 42 capsule 1  . Cholecalciferol (VITAMIN D-3) 1000 UNITS CAPS Take 5,000 Units by mouth daily.     [name] CINNAMON PO Take 3 tablets by mouth 2 (two) times daily.    [name] ezetimibe (ZETIA) 10 MG tablet Take 1 tablet (10 mg total) by mouth daily. 90 tablet 1  . ezetimibe (ZETIA) 10 MG tablet Take 1 tablet (10 mg total) by mouth daily. 90 tablet 3  . ferrous sulfate 325 (65 FE) MG tablet Take 1 tablet (325 mg total) by mouth every other day. 30 tablet 0  . fish oil-omega-3 fatty acids 1000 MG capsule Take 1 g by mouth 2 (two) times daily.    . furosemide (LASIX) 40 MG tablet Take 1 tablet 2 x/ day for BP & Fluid 180 tablet 1  . gabapentin (NEURONTIN) 300 MG capsule Take 1 capsule (300 mg total) by mouth 3 (three) times daily. 270 capsule 4  . insulin lispro (HUMALOG) 100 UNIT/ML cartridge Before each meal 3 times a day, 140-199 - 2 units, 200-250 - 4 units, 251-299 - 6 units,  300-349 - 8 units,  350 or above 10 units. Insulin PEN if approved, provide syringes and needles if needed. 15 mL 0  . insulin NPH Human (NOVOLIN N RELION) 100 UNIT/ML injection Inject 100 Units into the skin. Patient states he takes 18 units  at night and does not take in the AM unless glucose is above 150.    [name] insulin regular (HUMULIN R) 100 units/mL injection Before each meal 3 times a day, 140-199 - 2 units, 200-250 - 4 units, 251-299 - 6 units,  300-349 - 8 units,  350 or above 10 units. Insulin PEN if approved, provide syringes and needles if needed. 10 mL 0  . metFORMIN (GLUCOPHAGE-XR) 500 MG 24 hr tablet Take 1,000 mg by mouth 2 (two) times daily.     . nitroGLYCERIN (NITROSTAT) 0.4 MG SL tablet Place 1 tablet (0.4 mg total) under the tongue every 5 (five) minutes as needed for chest pain. 25 tablet 11  . potassium chloride SA (K-DUR,KLOR-CON) 20 MEQ tablet Take 1 tablet (20 mEq total) by mouth daily. 90 tablet 3  . Probiotic Product (PROBIOTIC PO) Take 1 capsule by mouth 2 (two) times daily.     [name] senna-docusate (SENOKOT-S) 8.6-50 MG tablet Take 1 tablet by mouth at bedtime. 30 tablet 0  . tamsulosin (FLOMAX) 0.4 MG CAPS capsule Take 1 capsule (0.4 mg total) by mouth daily after breakfast. 30 capsule 0  . traMADol (ULTRAM) 50 MG tablet Take 1 tablet (50 mg total) by mouth every 6 (six) hours as needed for moderate pain. 15 tablet 0  . vitamin  B-12 (CYANOCOBALAMIN) 500 MCG tablet Take 500 mcg by mouth daily.     . vitamin C (ASCORBIC ACID) 500 MG tablet Take 500 mg by mouth daily.     [name] VITAMIN E PO Take 1 tablet by mouth daily.     No current facility-administered medications for this visit.      PHYSICAL EXAM: Vitals:   08/24/16 1108  BP: 129/69  Pulse: 78  Resp: 16  Temp: 97.7 F (36.5 C)  TempSrc: Oral  SpO2: 97%  Weight: 194 lb (88 kg)  Height: 5\' 9"  (1.753 m)    GENERAL: The patient is a well-nourished male, in no acute distress. The vital signs are documented above. Kidneys have a large area on the lateral half of his transmetatarsal wound with fibrinous exudate and some fascia exposed. No fluctuance and no extension into his foot. Also has a heel ulcer which at potentially is full-thickness in the  midportion of this  MEDICAL ISSUES: Continued slow healing of metatarsal 8 dictation. I have recommended formal debridement in the operating room for removal of any dead tissue and potentially any bone. His amputation accident does look better today than it did 3 weeks ago. I had a very frank discussion again with the patient and his family who understand that this may still result in the below-knee amputation but I am encouraged that with continued debridement and local wound care may be able to salvage transmetatarsal amputation. We'll plan for outpatient debridement in the operating room on 08/27/2016   Rosetta Posner, MD [employer] [facility] Tel [phone] Pager [phone]

## 2016-08-26 ENCOUNTER — Encounter (HOSPITAL_COMMUNITY): Payer: Self-pay | Admitting: *Deleted

## 2016-08-26 MED ORDER — DEXTROSE 5 % IV SOLN
1.5000 g | INTRAVENOUS | Status: AC
  Administered 2016-08-27: 1.5 g via INTRAVENOUS
  Filled 2016-08-26: qty 1.5

## 2016-08-26 NOTE — Progress Notes (Signed)
Anesthesia Chart Review:  Pt is a same day work up.   Pt is a 75 year old male scheduled for debridement R transmetatarsal amputation.   - Cardiologist is Minus Breeding, MD.  -  PCP is [name], MD.   PMH includes:  CAD (s/p CABG 2010), aortic stenosis, CVA (2013), ischemic cardiomyopathy, severe PVD (s/p atherectomy/balloon angioplasty of R peroneal and tibioperoneal trunk 06/09/16), HTN, DM, hyperlipidemia, carotid stenosis (s/p R CEA), COPD, melanoma. Former smoker. BMI 29. 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 S/p ACDF 06/19/15. 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   Medications include: atenolol, carvedilol, zetia, iron, lasix, novolin N, humulin R, metformin, potassium  Labs will be obtained DOS.   CXR 06/05/16:  1. Stable mild cardiomegaly without pulmonary edema. 2. Stable trace left pleural effusion and mild left basilar atelectasis.  EKG 06/08/16: Sinus tachycardia (127 bpm). Left axis deviation. Moderate voltage criteria for LVH, may be normal variant. Marked ST abnormality, possible lateral subendocardial injury. New since previous tracing.  Cardiac cath 06/07/16:   Ost LM to LM lesion, 70% stenosed.  Ost Ramus to Ramus lesion, 50% stenosed.  Mid LAD lesion, 100% stenosed.  Mid RCA lesion, 100% stenosed.  Prox Cx to Mid Cx lesion, 90% stenosed.  Mid Cx lesion, 100% stenosed.  LIMA was injected is normal in caliber, and is anatomically normal.  SVG .  Origin lesion, 100% stenosed.  Ost Cx to Prox Cx lesion, 60% stenosed.  Dist RCA lesion, 50% stenosed.  2nd Mrg lesion, 50% stenosed.  - Moderately severe right-sided heart pressure elevation.  PA pressure at 67/23 and mean pressure of 39. - Moderate aortic root dilatation. - Severe calcific aortic valve stenosis with a peak to peak gradient of 52 mm Hg, a mean gradient of 52.9 mm Hg, and a calculated aortic valve area of 0.67 cm. in this patient with previously documented ejection fraction of 25%. - LVEDP elevation at  31 -33 mm. - Significant multivessel native CAD with 70% distal left main stenosis and severely diseased proximal LAD with total occlusion of the LAD after the proximal septal perforating arteries; 50% diffuse proximal ramus intermediate vessel; 60 and 90% proximal circumflex stenosis with total occlusion of the mid AV groove circumflex and total occlusion of the mid RCA. - Patent LIMA graft supplying the LAD. - Patent sequential vein graft supplying the OM2 and OM3  circumflex marginal vessels 50% narrowing in the small OM 2 vessel after the graft anastomosis. - Occluded vein graft, which had previously supplied the ramus vessel. - Patent SVG to distal RCA with smooth 40-50% narrowing in the RCA beyond the anastomosis.  Echo 02/29/16:  - Left ventricle: The cavity size was normal. Wall thickness was normal. Systolic function was severely reduced. The estimated ejection fraction was in the range of 25% to 30%. Akinesis of the inferior myocardium. - Aortic valve: There was moderate to severe stenosis. Valve area (VTI): 0.66 cm^2. Valve area (Vmax): 0.62 cm^2. Valve area (Vmean): 0.58 cm^2. - Left atrium: The atrium was mildly dilated. - Pulmonary arteries: Systolic pressure was moderately increased. PA peak pressure: 54 mm Hg (S).  Carotid duplex 11/19/13:  - R ICA patent with history CEA. <40% stenosis - L ICA stenosis 40-59%  Pt has severe AS and will not be considered for TAVR until recovered from PVD treatment/foot wound. Pt tolerated metatarsal amputation under MAC/regional block 06/11/16. If labs acceptable DOS, I anticipate pt can proceed as scheduled.   Willeen Cass, FNP-BC [facility]/Anesthesiology Phone: [phone]  08/26/2016 1:47 PM

## 2016-08-26 NOTE — Progress Notes (Addendum)
Anthony Holder denies chest pain or shortness of breath.  Patient reports that he is not on Coreg, but is on Atenolol. I sent a note through EPIC to Dr Melford Aase, patients PCP (who orders Atenolol), for clarification.  I instructed patient to check CBG to check CBG and if it is less than 70 to treat it with Glucose Gel, Glucose tablets or 1/2 cup of clear juice like apple juice or cranberry juice, or 1/2 cup of regular soda. (not cream soda). I instructed patient to recheck CBG in 15 minutes and if CBG is not greater than 70, to  Call [phone] (pre- op). If it is before pre-op opens to retreat as before and recheck CBG in 15 minutes. I told patient to make note of time that liquid is taken and amount, that surgical time may have to be adjusted.

## 2016-08-27 ENCOUNTER — Ambulatory Visit (HOSPITAL_COMMUNITY): Payer: Medicare Other | Admitting: Emergency Medicine

## 2016-08-27 ENCOUNTER — Encounter (HOSPITAL_COMMUNITY): Payer: Self-pay | Admitting: *Deleted

## 2016-08-27 ENCOUNTER — Ambulatory Visit (HOSPITAL_COMMUNITY)
Admission: RE | Admit: 2016-08-27 | Discharge: 2016-08-27 | Disposition: A | Discharge status: Home / Self Care | Payer: Medicare Other | Source: Ambulatory Visit | Attending: Vascular Surgery | Admitting: Vascular Surgery

## 2016-08-27 ENCOUNTER — Encounter (HOSPITAL_COMMUNITY)
Admission: RE | Disposition: A | Discharge status: Home / Self Care | Payer: Self-pay | Source: Ambulatory Visit | Attending: Vascular Surgery

## 2016-08-27 DIAGNOSIS — L97419 Non-pressure chronic ulcer of right heel and midfoot with unspecified severity: Secondary | ICD-10-CM | POA: Insufficient documentation

## 2016-08-27 DIAGNOSIS — Z7982 Long term (current) use of aspirin: Secondary | ICD-10-CM | POA: Diagnosis not present

## 2016-08-27 DIAGNOSIS — T8789 Other complications of amputation stump: Secondary | ICD-10-CM | POA: Insufficient documentation

## 2016-08-27 DIAGNOSIS — Y835 Amputation of limb(s) as the cause of abnormal reaction of the patient, or of later complication, without mention of misadventure at the time of the procedure: Secondary | ICD-10-CM | POA: Diagnosis not present

## 2016-08-27 DIAGNOSIS — Z8673 Personal history of transient ischemic attack (TIA), and cerebral infarction without residual deficits: Secondary | ICD-10-CM | POA: Insufficient documentation

## 2016-08-27 DIAGNOSIS — N189 Chronic kidney disease, unspecified: Secondary | ICD-10-CM | POA: Insufficient documentation

## 2016-08-27 DIAGNOSIS — T8189XA Other complications of procedures, not elsewhere classified, initial encounter: Secondary | ICD-10-CM | POA: Diagnosis not present

## 2016-08-27 DIAGNOSIS — T8781 Dehiscence of amputation stump: Secondary | ICD-10-CM | POA: Diagnosis not present

## 2016-08-27 DIAGNOSIS — Z87891 Personal history of nicotine dependence: Secondary | ICD-10-CM | POA: Diagnosis not present

## 2016-08-27 DIAGNOSIS — M1990 Unspecified osteoarthritis, unspecified site: Secondary | ICD-10-CM | POA: Diagnosis not present

## 2016-08-27 DIAGNOSIS — I2510 Atherosclerotic heart disease of native coronary artery without angina pectoris: Secondary | ICD-10-CM | POA: Diagnosis not present

## 2016-08-27 DIAGNOSIS — Z794 Long term (current) use of insulin: Secondary | ICD-10-CM | POA: Insufficient documentation

## 2016-08-27 DIAGNOSIS — I350 Nonrheumatic aortic (valve) stenosis: Secondary | ICD-10-CM | POA: Insufficient documentation

## 2016-08-27 DIAGNOSIS — E785 Hyperlipidemia, unspecified: Secondary | ICD-10-CM | POA: Diagnosis not present

## 2016-08-27 DIAGNOSIS — J449 Chronic obstructive pulmonary disease, unspecified: Secondary | ICD-10-CM | POA: Diagnosis not present

## 2016-08-27 DIAGNOSIS — Z951 Presence of aortocoronary bypass graft: Secondary | ICD-10-CM | POA: Insufficient documentation

## 2016-08-27 DIAGNOSIS — I129 Hypertensive chronic kidney disease with stage 1 through stage 4 chronic kidney disease, or unspecified chronic kidney disease: Secondary | ICD-10-CM | POA: Insufficient documentation

## 2016-08-27 DIAGNOSIS — E119 Type 2 diabetes mellitus without complications: Secondary | ICD-10-CM | POA: Diagnosis not present

## 2016-08-27 HISTORY — PX: WOUND DEBRIDEMENT: SHX247

## 2016-08-27 LAB — COMPREHENSIVE METABOLIC PANEL
ALT: 8 U/L — ABNORMAL LOW (ref 17–63)
ANION GAP: 10 (ref 5–15)
AST: 13 U/L — ABNORMAL LOW (ref 15–41)
Albumin: 3.3 g/dL — ABNORMAL LOW (ref 3.5–5.0)
Alkaline Phosphatase: 45 U/L (ref 38–126)
BUN: 19 mg/dL (ref 6–20)
CALCIUM: 9.3 mg/dL (ref 8.9–10.3)
CO2: 24 mmol/L (ref 22–32)
Chloride: 105 mmol/L (ref 101–111)
Creatinine, Ser: 1.3 mg/dL — ABNORMAL HIGH (ref 0.61–1.24)
GFR, EST NON AFRICAN AMERICAN: 52 mL/min — AB (ref >60)
Glucose, Bld: 131 mg/dL — ABNORMAL HIGH (ref 65–99)
Potassium: 4.1 mmol/L (ref 3.5–5.1)
SODIUM: 139 mmol/L (ref 135–145)
Total Bilirubin: 0.8 mg/dL (ref 0.3–1.2)
Total Protein: 6.8 g/dL (ref 6.5–8.1)

## 2016-08-27 LAB — CBC
HCT: 35.1 % — ABNORMAL LOW (ref 39.0–52.0)
HEMOGLOBIN: 11.1 g/dL — AB (ref 13.0–17.0)
MCH: 28 pg (ref 26.0–34.0)
MCHC: 31.6 g/dL (ref 30.0–36.0)
MCV: 88.6 fL (ref 78.0–100.0)
PLATELETS: 322 10*3/uL (ref 150–400)
RBC: 3.96 MIL/uL — ABNORMAL LOW (ref 4.22–5.81)
RDW: 16.8 % — ABNORMAL HIGH (ref 11.5–15.5)
WBC: 12.8 10*3/uL — ABNORMAL HIGH (ref 4.0–10.5)

## 2016-08-27 LAB — GLUCOSE, CAPILLARY
GLUCOSE-CAPILLARY: 142 mg/dL — AB (ref 65–99)
Glucose-Capillary: 123 mg/dL — ABNORMAL HIGH (ref 65–99)

## 2016-08-27 SURGERY — DEBRIDEMENT, WOUND
Anesthesia: Monitor Anesthesia Care | Site: Leg Lower | Laterality: Right

## 2016-08-27 MED ORDER — MIDAZOLAM HCL 2 MG/2ML IJ SOLN
INTRAMUSCULAR | Status: AC
  Administered 2016-08-27: 1 mg via INTRAVENOUS
  Filled 2016-08-27: qty 2

## 2016-08-27 MED ORDER — FENTANYL CITRATE (PF) 100 MCG/2ML IJ SOLN
100.0000 ug | Freq: Once | INTRAMUSCULAR | Status: AC
  Administered 2016-08-27: 100 ug via INTRAVENOUS

## 2016-08-27 MED ORDER — LACTATED RINGERS IV SOLN: Freq: Once | INTRAVENOUS | Status: DC

## 2016-08-27 MED ORDER — HYDROMORPHONE HCL 1 MG/ML IJ SOLN
0.2500 mg | INTRAMUSCULAR | Status: DC | PRN
Start: 2016-08-27 — End: 2016-08-27

## 2016-08-27 MED ORDER — 0.9 % SODIUM CHLORIDE (POUR BTL) OPTIME
TOPICAL | Status: DC | PRN
  Administered 2016-08-27: 1000 mL

## 2016-08-27 MED ORDER — CHLORHEXIDINE GLUCONATE CLOTH 2 % EX PADS: 6.0000 | MEDICATED_PAD | Freq: Once | CUTANEOUS | Status: DC

## 2016-08-27 MED ORDER — PROMETHAZINE HCL 25 MG/ML IJ SOLN: 6.2500 mg | INTRAMUSCULAR | Status: DC | PRN

## 2016-08-27 MED ORDER — FENTANYL CITRATE (PF) 100 MCG/2ML IJ SOLN
INTRAMUSCULAR | Status: AC
  Administered 2016-08-27: 100 ug via INTRAVENOUS
  Filled 2016-08-27: qty 2

## 2016-08-27 MED ORDER — SODIUM CHLORIDE 0.9 % IV SOLN: INTRAVENOUS | Status: DC

## 2016-08-27 MED ORDER — PROPOFOL 500 MG/50ML IV EMUL
INTRAVENOUS | Status: DC | PRN
  Administered 2016-08-27: 25 ug/kg/min via INTRAVENOUS

## 2016-08-27 MED ORDER — LACTATED RINGERS IV SOLN
Freq: Once | INTRAVENOUS | Status: AC
  Administered 2016-08-27: 09:00:00 via INTRAVENOUS

## 2016-08-27 MED ORDER — ROPIVACAINE HCL 7.5 MG/ML IJ SOLN
INTRAMUSCULAR | Status: DC | PRN
  Administered 2016-08-27: 20 mL via PERINEURAL

## 2016-08-27 MED ORDER — FENTANYL CITRATE (PF) 250 MCG/5ML IJ SOLN
INTRAMUSCULAR | Status: AC
  Filled 2016-08-27: qty 5

## 2016-08-27 MED ORDER — LACTATED RINGERS IV SOLN
INTRAVENOUS | Status: DC | PRN
  Administered 2016-08-27: 08:00:00 via INTRAVENOUS

## 2016-08-27 MED ORDER — TRAMADOL HCL 50 MG PO TABS: 50.0000 mg | ORAL_TABLET | Freq: Four times a day (QID) | ORAL | 0 refills | Status: AC | PRN

## 2016-08-27 MED ORDER — MIDAZOLAM HCL 2 MG/2ML IJ SOLN
1.0000 mg | Freq: Once | INTRAMUSCULAR | Status: AC
  Administered 2016-08-27: 1 mg via INTRAVENOUS

## 2016-08-27 SURGICAL SUPPLY — 31 items
BANDAGE ACE 4X5 VEL STRL LF (GAUZE/BANDAGES/DRESSINGS) IMPLANT
BANDAGE ACE 6X5 VEL STRL LF (GAUZE/BANDAGES/DRESSINGS) ×1 IMPLANT
BNDG GAUZE ELAST 4 BULKY (GAUZE/BANDAGES/DRESSINGS) ×1 IMPLANT
CANISTER SUCTION 2500CC (MISCELLANEOUS) ×2 IMPLANT
CLIP LIGATING EXTRA MED SLVR (CLIP) ×2 IMPLANT
CLIP LIGATING EXTRA SM BLUE (MISCELLANEOUS) ×2 IMPLANT
COVER SURGICAL LIGHT HANDLE (MISCELLANEOUS) ×2 IMPLANT
DRAPE EXTREMITY T 121X128X90 (DRAPE) ×1 IMPLANT
DRAPE U-SHAPE 47X51 STRL (DRAPES) IMPLANT
DRAPE U-SHAPE 76X120 STRL (DRAPES) IMPLANT
ELECT CAUTERY BLADE 6.4 (BLADE) ×1 IMPLANT
ELECT REM PT RETURN 9FT ADLT (ELECTROSURGICAL) ×2
ELECTRODE REM PT RTRN 9FT ADLT (ELECTROSURGICAL) ×1 IMPLANT
GAUZE SPONGE 4X4 12PLY STRL (GAUZE/BANDAGES/DRESSINGS) ×2 IMPLANT
GLOVE SS BIOGEL STRL SZ 7.5 (GLOVE) ×1 IMPLANT
GLOVE SUPERSENSE BIOGEL SZ 7.5 (GLOVE) ×1
GOWN STRL REUS W/ TWL LRG LVL3 (GOWN DISPOSABLE) ×3 IMPLANT
GOWN STRL REUS W/TWL LRG LVL3 (GOWN DISPOSABLE) ×6
KIT BASIN OR (CUSTOM PROCEDURE TRAY) ×2 IMPLANT
KIT ROOM TURNOVER OR (KITS) ×2 IMPLANT
NS IRRIG 1000ML POUR BTL (IV SOLUTION) ×2 IMPLANT
PACK GENERAL/GYN (CUSTOM PROCEDURE TRAY) ×2 IMPLANT
PACK UNIVERSAL I (CUSTOM PROCEDURE TRAY) IMPLANT
PAD ARMBOARD 7.5X6 YLW CONV (MISCELLANEOUS) ×4 IMPLANT
SPONGE GAUZE 4X4 12PLY STER LF (GAUZE/BANDAGES/DRESSINGS) ×1 IMPLANT
STAPLER VISISTAT 35W (STAPLE) IMPLANT
SUT ETHILON 3 0 PS 1 (SUTURE) ×1 IMPLANT
SUT VIC AB 2-0 CTX 36 (SUTURE) IMPLANT
SUT VIC AB 3-0 SH 27 (SUTURE)
SUT VIC AB 3-0 SH 27X BRD (SUTURE) IMPLANT
WATER STERILE IRR 1000ML POUR (IV SOLUTION) ×2 IMPLANT

## 2016-08-27 NOTE — H&P (View-Only) (Signed)
Patient name: Anthony Holder MRN: [id number] DOB: 12-15-1941 Sex: male  REASON FOR VISIT: Follow-up transmetatarsal amputation  HPI: Anthony Holder is a 75 y.o. male here today for continued follow-up of transmetatarsal amputation. His was by with Dr. Scot Dock in 06/11/2016. He had been seen 3 weeks ago. Is here today for follow-up.  Current Outpatient Prescriptions  Medication Sig Dispense Refill  . aspirin 81 MG tablet Take 81 mg by mouth daily.    . baclofen (LIORESAL) 10 MG tablet Take 1 tablet (10 mg total) by mouth daily. 90 tablet 1  . carvedilol (COREG) 12.5 MG tablet Take 1 tablet (12.5 mg total) by mouth 2 (two) times daily with a meal. 60 tablet 0  . cephALEXin (KEFLEX) 500 MG capsule Take 1 capsule (500 mg total) by mouth 3 (three) times daily. 42 capsule 1  . Cholecalciferol (VITAMIN D-3) 1000 UNITS CAPS Take 5,000 Units by mouth daily.     [name] CINNAMON PO Take 3 tablets by mouth 2 (two) times daily.    [name] ezetimibe (ZETIA) 10 MG tablet Take 1 tablet (10 mg total) by mouth daily. 90 tablet 1  . ezetimibe (ZETIA) 10 MG tablet Take 1 tablet (10 mg total) by mouth daily. 90 tablet 3  . ferrous sulfate 325 (65 FE) MG tablet Take 1 tablet (325 mg total) by mouth every other day. 30 tablet 0  . fish oil-omega-3 fatty acids 1000 MG capsule Take 1 g by mouth 2 (two) times daily.    . furosemide (LASIX) 40 MG tablet Take 1 tablet 2 x/ day for BP & Fluid 180 tablet 1  . gabapentin (NEURONTIN) 300 MG capsule Take 1 capsule (300 mg total) by mouth 3 (three) times daily. 270 capsule 4  . insulin lispro (HUMALOG) 100 UNIT/ML cartridge Before each meal 3 times a day, 140-199 - 2 units, 200-250 - 4 units, 251-299 - 6 units,  300-349 - 8 units,  350 or above 10 units. Insulin PEN if approved, provide syringes and needles if needed. 15 mL 0  . insulin NPH Human (NOVOLIN N RELION) 100 UNIT/ML injection Inject 100 Units into the skin. Patient states he takes 18 units  at night and does not take in the AM unless glucose is above 150.    [name] insulin regular (HUMULIN R) 100 units/mL injection Before each meal 3 times a day, 140-199 - 2 units, 200-250 - 4 units, 251-299 - 6 units,  300-349 - 8 units,  350 or above 10 units. Insulin PEN if approved, provide syringes and needles if needed. 10 mL 0  . metFORMIN (GLUCOPHAGE-XR) 500 MG 24 hr tablet Take 1,000 mg by mouth 2 (two) times daily.     . nitroGLYCERIN (NITROSTAT) 0.4 MG SL tablet Place 1 tablet (0.4 mg total) under the tongue every 5 (five) minutes as needed for chest pain. 25 tablet 11  . potassium chloride SA (K-DUR,KLOR-CON) 20 MEQ tablet Take 1 tablet (20 mEq total) by mouth daily. 90 tablet 3  . Probiotic Product (PROBIOTIC PO) Take 1 capsule by mouth 2 (two) times daily.     [name] senna-docusate (SENOKOT-S) 8.6-50 MG tablet Take 1 tablet by mouth at bedtime. 30 tablet 0  . tamsulosin (FLOMAX) 0.4 MG CAPS capsule Take 1 capsule (0.4 mg total) by mouth daily after breakfast. 30 capsule 0  . traMADol (ULTRAM) 50 MG tablet Take 1 tablet (50 mg total) by mouth every 6 (six) hours as needed for moderate pain. 15 tablet 0  . vitamin  B-12 (CYANOCOBALAMIN) 500 MCG tablet Take 500 mcg by mouth daily.     . vitamin C (ASCORBIC ACID) 500 MG tablet Take 500 mg by mouth daily.     [name] VITAMIN E PO Take 1 tablet by mouth daily.     No current facility-administered medications for this visit.      PHYSICAL EXAM: Vitals:   08/24/16 1108  BP: 129/69  Pulse: 78  Resp: 16  Temp: 97.7 F (36.5 C)  TempSrc: Oral  SpO2: 97%  Weight: 194 lb (88 kg)  Height: 5\' 9"  (1.753 m)    GENERAL: The patient is a well-nourished male, in no acute distress. The vital signs are documented above. Kidneys have a large area on the lateral half of his transmetatarsal wound with fibrinous exudate and some fascia exposed. No fluctuance and no extension into his foot. Also has a heel ulcer which at potentially is full-thickness in the  midportion of this  MEDICAL ISSUES: Continued slow healing of metatarsal 8 dictation. I have recommended formal debridement in the operating room for removal of any dead tissue and potentially any bone. His amputation accident does look better today than it did 3 weeks ago. I had a very frank discussion again with the patient and his family who understand that this may still result in the below-knee amputation but I am encouraged that with continued debridement and local wound care may be able to salvage transmetatarsal amputation. We'll plan for outpatient debridement in the operating room on 08/27/2016   Rosetta Posner, MD [facility] Tel [phone] Pager [phone]

## 2016-08-27 NOTE — Anesthesia Preprocedure Evaluation (Addendum)
Anesthesia Evaluation  Patient identified by MRN, date of birth, ID band Patient awake    Reviewed: Allergy & Precautions, NPO status , Patient's Chart, lab work & pertinent test results  History of Anesthesia Complications Negative for: history of anesthetic complications  Airway Mallampati: II  TM Distance: >3 FB Neck ROM: Full    Dental  (+) Dental Advisory Given, Edentulous Upper, Edentulous Lower   Pulmonary COPD, former smoker,    breath sounds clear to auscultation       Cardiovascular hypertension, Pt. on medications and Pt. on home beta blockers + CAD, + CABG, + Peripheral Vascular Disease and +CHF  + Valvular Problems/Murmurs AS  Rhythm:Regular Rate:Normal + Systolic murmurs Severe AS with mean gradient of 55mmHg and low EF 25-30%.   Neuro/Psych negative neurological ROS     GI/Hepatic negative GI ROS, Neg liver ROS,   Endo/Other  diabetes, Type 2  Renal/GU CRFRenal disease     Musculoskeletal  (+) Arthritis ,   Abdominal   Peds  Hematology  (+) anemia ,   Anesthesia Other Findings   Reproductive/Obstetrics                            Lab Results  Component Value Date   WBC 11.7 (H) 07/08/2016   HGB 12.5 (L) 07/08/2016   HCT 38.6 07/08/2016   MCV 86.4 07/08/2016   PLT 255 07/08/2016   Lab Results  Component Value Date   CREATININE 1.17 07/08/2016   BUN 29 (H) 07/08/2016   NA 134 (L) 07/08/2016   K 4.0 07/08/2016   CL 94 (L) 07/08/2016   CO2 27 07/08/2016   Lab Results  Component Value Date   INR 1.55 (H) 06/09/2016   INR 1.36 06/05/2016   INR 1.29 06/04/2016    Anesthesia Physical  Anesthesia Plan  ASA: IV  Anesthesia Plan: MAC and Regional   Post-op Pain Management:    Induction: Intravenous  Airway Management Planned: Simple Face Mask  Additional Equipment:   Intra-op Plan:   Post-operative Plan:   Informed Consent: I have reviewed the  patients History and Physical, chart, labs and discussed the procedure including the risks, benefits and alternatives for the proposed anesthesia with the patient or authorized representative who has indicated his/her understanding and acceptance.   Consent reviewed with POA  Plan Discussed with: CRNA  Anesthesia Plan Comments:         Anesthesia Quick Evaluation

## 2016-08-27 NOTE — Anesthesia Procedure Notes (Signed)
Anesthesia Regional Block:  Popliteal block  Pre-Anesthetic Checklist: ,, timeout performed, Correct Patient, Correct Site, Correct Laterality, Correct Procedure, Correct Position, site marked, Risks and benefits discussed,  Surgical consent,  Pre-op evaluation,  At surgeon's request and post-op pain management  Laterality: Right  Prep: chloraprep       Needles:  Injection technique: Single-shot  Needle Type: Echogenic Stimulator Needle     Needle Length:cm 9 cm Needle Gauge: 21 G    Additional Needles:  Procedures: ultrasound guided (picture in chart) and nerve stimulator Popliteal block  Nerve Stimulator or Paresthesia:  Response: plantar flexion, 0.45 mA,   Additional Responses:   Narrative:  Start time: 08/27/2016 8:14 AM End time: 08/27/2016 8:24 AM Injection made incrementally with aspirations every 5 mL.  Performed by: Personally  Anesthesiologist: Duane Boston  Additional Notes: A functioning IV was confirmed and monitors were applied.  Sterile prep and drape, hand hygiene and sterile gloves were used.  Negative aspiration and test dose prior to incremental administration of local anesthetic. The patient tolerated the procedure well.Ultrasound  guidance: relevant anatomy identified, needle position confirmed, local anesthetic spread visualized around nerve(s), vascular puncture avoided.  Image printed for medical record.

## 2016-08-27 NOTE — Anesthesia Postprocedure Evaluation (Signed)
Anesthesia Post Note  Patient: Anthony Holder  Procedure(s) Performed: Procedure(s) (LRB): DEBRIDEMENT TRANSMETATARSAL AMPUTATION (Right)  Patient location during evaluation: PACU Anesthesia Type: MAC Level of consciousness: awake and alert Pain management: pain level controlled Vital Signs Assessment: post-procedure vital signs reviewed and stable Respiratory status: spontaneous breathing, nonlabored ventilation, respiratory function stable and patient connected to nasal cannula oxygen Cardiovascular status: stable and blood pressure returned to baseline Anesthetic complications: no    Last Vitals:  Vitals:   08/27/16 1120 08/27/16 1135  BP: (!) 107/51 113/66  Pulse: 63 63  Resp: 10 16  Temp: 36.4 C     Last Pain:  Vitals:   08/27/16 0721  TempSrc: Oral                 Yarielis Funaro J

## 2016-08-27 NOTE — Transfer of Care (Signed)
Immediate Anesthesia Transfer of Care Note  Patient: Anthony Holder  Procedure(s) Performed: Procedure(s): DEBRIDEMENT TRANSMETATARSAL AMPUTATION (Right)  Patient Location: PACU  Anesthesia Type:MAC combined with regional for post-op pain  Level of Consciousness: awake, alert  and oriented  Airway & Oxygen Therapy: Patient Spontanous Breathing and Patient connected to nasal cannula oxygen  Post-op Assessment: Report given to RN and Post -op Vital signs reviewed and stable  Post vital signs: Reviewed and stable  Last Vitals:  Vitals:   08/27/16 0840 08/27/16 1048  BP: 131/68 99/74  Pulse: 66 64  Resp: 16 12  Temp:  36.5 C    Last Pain:  Vitals:   08/27/16 0721  TempSrc: Oral      Patients Stated Pain Goal: 0 ([id number])  Complications: No apparent anesthesia complications

## 2016-08-27 NOTE — Anesthesia Procedure Notes (Signed)
Procedure Name: MAC Date/Time: 08/27/2016 9:55 AM Performed by: Eligha Bridegroom Pre-anesthesia Checklist: Patient identified, Emergency Drugs available, Suction available, Patient being monitored and Timeout performed Patient Re-evaluated:Patient Re-evaluated prior to inductionOxygen Delivery Method: Nasal cannula

## 2016-08-27 NOTE — Interval H&P Note (Signed)
History and Physical Interval Note:  08/27/2016 7:37 AM  Anthony Holder  has presented today for surgery, with the diagnosis of Nonhealing right transmetatarsal amputation site T81.89XA  The various methods of treatment have been discussed with the patient and family. After consideration of risks, benefits and other options for treatment, the patient has consented to  Procedure(s): DEBRIDEMENT TRANSMETATARSAL AMPUTATION (Right) as a surgical intervention .  The patient's history has been reviewed, patient examined, no change in status, stable for surgery.  I have reviewed the patient's chart and labs.  Questions were answered to the patient's satisfaction.     Curt Jews

## 2016-08-27 NOTE — Op Note (Signed)
    OPERATIVE REPORT  DATE OF SURGERY: 08/27/2016  PATIENT: Anthony Holder, 75 y.o. male MRN: [id number]  DOB: [birth year]  PRE-OPERATIVE DIAGNOSIS: Poorly healing right transmetatarsal amputation  POST-OPERATIVE DIAGNOSIS:  Same  PROCEDURE: Debridement of right transmetatarsal" and heel ulcer right leg  SURGEON:  Curt Jews, M.D.  PHYSICIAN ASSISTANT: Nurse  ANESTHESIA:  Ankle block  EBL: Minimal ml  Total I/O In: 450 [I.V.:450] Out: -   BLOOD ADMINISTERED: None  DRAINS: None  SPECIMEN: None  COUNTS CORRECT:  YES  PLAN OF CARE: PACU   PATIENT DISPOSITION:  PACU - hemodynamically stable  PROCEDURE DETAILS: Patient was taken to the operative placed supine position where the area of the foot from draped in sterile fashion. The patient had transmetatarsal dictation in mid June. The medial aspect is healed but the lateral aspect had fat fibrinous exudate and fascia exposed. He was taking the operating room for a debridement. Nonviable tissue was removed. There was metatarsal bone exposed at the base of the wound on the lateral aspect and this was removed and debrided back with the [facility]. There was a good bleeding at these areas and no evidence of collections of. Uncertain nonviable tissue was noted. The wound irrigated with saline and the electrocautery was used for control of bleeding. Patient also had a 2 cm heel ulceration and this was debrided. There was some shotty viable fascia at the base of this as well. Wounds were irrigated with saline and the saline dressing was positioned back in the open area and a sterile dressing was applied. The patient was transferred to the recovery room stable condition   Curt Jews, M.D. 08/27/2016 10:52 AM

## 2016-08-30 ENCOUNTER — Telehealth: Payer: Self-pay | Admitting: Vascular Surgery

## 2016-08-30 ENCOUNTER — Encounter (HOSPITAL_COMMUNITY): Payer: Self-pay | Admitting: Vascular Surgery

## 2016-08-30 NOTE — Telephone Encounter (Signed)
-----   Message from Mena Goes, RN sent at 08/27/2016 11:24 AM EDT ----- Regarding: 2 weeks postop   ----- Message ----- From: Ulyses Amor, PA-C Sent: 08/27/2016  10:31 AM To: [facility] Charge Pool  F/U with DR. Early in 2 weeks s/p I & D TMA

## 2016-08-30 NOTE — Telephone Encounter (Signed)
Scheduled appt for 09/14/16 at 11:30am w/ TFE. Gave information to pt's wife regarding appt/awt

## 2016-09-01 ENCOUNTER — Telehealth: Payer: Self-pay | Admitting: Pharmacist

## 2016-09-01 NOTE — Telephone Encounter (Signed)
Pt called about approval of Repatha - he was extremely upset that he had not heard anything from our office about [place].  PER Kristin's note he was to try Zetia for 90 days have labs and if LDL still elevated would pursue coverage of Repatha at that time. Pt states he knew nothing of this but that he has been taking Zetia and tolerating it "okay."  He states he is to have labs next week with primary care but that he did not know anything about labs for Dr. Rosezella [place] office. Gave him instruction for getting lipid panel and told him where lab is located on first floor.   He states he understands.

## 2016-09-01 NOTE — Telephone Encounter (Signed)
Error

## 2016-09-07 ENCOUNTER — Encounter: Payer: Self-pay | Admitting: Vascular Surgery

## 2016-09-14 ENCOUNTER — Ambulatory Visit (INDEPENDENT_AMBULATORY_CARE_PROVIDER_SITE_OTHER): Payer: Medicare Other | Admitting: Vascular Surgery

## 2016-09-14 ENCOUNTER — Encounter: Payer: Self-pay | Admitting: Vascular Surgery

## 2016-09-14 VITALS — BP 93/71 | HR 81 | Temp 97.0°F | Resp 24 | Ht 69.0 in | Wt 187.0 lb

## 2016-09-14 DIAGNOSIS — I7025 Atherosclerosis of native arteries of other extremities with ulceration: Secondary | ICD-10-CM

## 2016-09-14 NOTE — Progress Notes (Signed)
Patient name: Anthony Holder MRN: [id number] DOB: [birth year] Sex: male  REASON FOR VISIT: Follow-up right fifth metatarsal amputation  HPI: Anthony Holder is a 75 y.o. male initially had right transmetatarsal amputation mid June. There was a great deal of open area of the transmetatarsal he education with tendon exposed. He was taken back to the operating room for debridement on 08/27/2016 is here today for follow-up. He is treating this with the wet-to-dry dressing changes.  Current Outpatient Prescriptions  Medication Sig Dispense Refill  . atenolol (TENORMIN) 50 MG tablet Take 50 mg by mouth daily.    . baclofen (LIORESAL) 10 MG tablet Take 1 tablet (10 mg total) by mouth daily. 90 tablet 1  . carvedilol (COREG) 12.5 MG tablet Take 1 tablet (12.5 mg total) by mouth 2 (two) times daily with a meal. 60 tablet 0  . Cholecalciferol (VITAMIN D-3) 1000 UNITS CAPS Take 5,000 Units by mouth daily.     [name] CINNAMON PO Take 3 tablets by mouth 2 (two) times daily.    [name] ezetimibe (ZETIA) 10 MG tablet Take 1 tablet (10 mg total) by mouth daily. 90 tablet 1  . ferrous sulfate 325 (65 FE) MG tablet Take 1 tablet (325 mg total) by mouth every other day. 30 tablet 0  . fish oil-omega-3 fatty acids 1000 MG capsule Take 2 g by mouth 2 (two) times daily.     . furosemide (LASIX) 40 MG tablet Take 1 tablet 2 x/ day for BP & Fluid 180 tablet 1  . gabapentin (NEURONTIN) 300 MG capsule Take 1 capsule (300 mg total) by mouth 3 (three) times daily. 270 capsule 4  . insulin NPH Human (NOVOLIN N RELION) 100 UNIT/ML injection Inject 17 Units into the skin at bedtime. Patient states he takes 17 units at night and does not take in the AM unless glucose is above 150.    . metFORMIN (GLUCOPHAGE-XR) 500 MG 24 hr tablet Take 1,000 mg by mouth 2 (two) times daily.     . nitroGLYCERIN (NITROSTAT) 0.4 MG SL tablet Place 1 tablet (0.4 mg total) under the tongue every 5 (five) minutes as needed  for chest pain. 25 tablet 11  . potassium chloride SA (K-DUR,KLOR-CON) 20 MEQ tablet Take 1 tablet (20 mEq total) by mouth daily. 90 tablet 3  . Probiotic Product (PROBIOTIC PO) Take 1 capsule by mouth daily.     . tamsulosin (FLOMAX) 0.4 MG CAPS capsule Take 1 capsule (0.4 mg total) by mouth daily after breakfast. 30 capsule 0  . traMADol (ULTRAM) 50 MG tablet Take 1 tablet (50 mg total) by mouth every 6 (six) hours as needed for moderate pain. 15 tablet 0  . vitamin B-12 (CYANOCOBALAMIN) 500 MCG tablet Take 500 mcg by mouth daily.     . vitamin C (ASCORBIC ACID) 500 MG tablet Take 500 mg by mouth daily.     [name] VITAMIN E PO Take 1 tablet by mouth daily.    . cephALEXin (KEFLEX) 500 MG capsule Take 1 capsule (500 mg total) by mouth 3 (three) times daily. (Patient not taking: Reported on 09/14/2016) 42 capsule 1  . insulin regular (HUMULIN R) 100 units/mL injection Before each meal 3 times a day, 140-199 - 2 units, 200-250 - 4 units, 251-299 - 6 units,  300-349 - 8 units,  350 or above 10 units. Insulin PEN if approved, provide syringes and needles if needed. (Patient not taking: Reported on 09/14/2016) 10 mL 0   No current facility-administered medications  for this visit.      PHYSICAL EXAM: Vitals:   09/14/16 1118  BP: 93/71  Pulse: 81  Resp: (!) 24  Temp: 97 F (36.1 C)  TempSrc: Oral  SpO2: 98%  Weight: 84.8 kg (187 lb)  Height: 5\' 9"  (1.753 m)    GENERAL: The patient is a well-nourished male, in no acute distress. The vital signs are documented above. He does have a ulceration on his heel which appears to be superficial in this approximately 1 cm in diameter. The lateral half of the transmetatarsal continues to have continued contraction. Some intravenous exudate was debris did that today. No evidence of infection  MEDICAL ISSUES: Continued local wound care will see him again in 3 weeks for continued follow-up and debridement in the office   Rosetta Posner, MD [facility] Tel [phone] Pager [phone]

## 2016-09-18 ENCOUNTER — Inpatient Hospital Stay (HOSPITAL_COMMUNITY)
Admission: EM | Admit: 2016-09-18 | Discharge: 2016-09-23 | DRG: 065 | Disposition: A | Discharge status: Home Health | Payer: Medicare Other | Attending: Internal Medicine | Admitting: Internal Medicine

## 2016-09-18 ENCOUNTER — Encounter (HOSPITAL_COMMUNITY): Payer: Self-pay | Admitting: Emergency Medicine

## 2016-09-18 ENCOUNTER — Emergency Department (HOSPITAL_COMMUNITY): Payer: Medicare Other

## 2016-09-18 DIAGNOSIS — I248 Other forms of acute ischemic heart disease: Secondary | ICD-10-CM | POA: Diagnosis not present

## 2016-09-18 DIAGNOSIS — E1151 Type 2 diabetes mellitus with diabetic peripheral angiopathy without gangrene: Secondary | ICD-10-CM | POA: Diagnosis not present

## 2016-09-18 DIAGNOSIS — R569 Unspecified convulsions: Secondary | ICD-10-CM

## 2016-09-18 DIAGNOSIS — I5032 Chronic diastolic (congestive) heart failure: Secondary | ICD-10-CM | POA: Diagnosis present

## 2016-09-18 DIAGNOSIS — E1122 Type 2 diabetes mellitus with diabetic chronic kidney disease: Secondary | ICD-10-CM | POA: Diagnosis not present

## 2016-09-18 DIAGNOSIS — I2510 Atherosclerotic heart disease of native coronary artery without angina pectoris: Secondary | ICD-10-CM | POA: Diagnosis not present

## 2016-09-18 DIAGNOSIS — R278 Other lack of coordination: Secondary | ICD-10-CM

## 2016-09-18 DIAGNOSIS — E872 Acidosis: Secondary | ICD-10-CM | POA: Diagnosis present

## 2016-09-18 DIAGNOSIS — Z85828 Personal history of other malignant neoplasm of skin: Secondary | ICD-10-CM

## 2016-09-18 DIAGNOSIS — Z981 Arthrodesis status: Secondary | ICD-10-CM

## 2016-09-18 DIAGNOSIS — I080 Rheumatic disorders of both mitral and aortic valves: Secondary | ICD-10-CM | POA: Diagnosis present

## 2016-09-18 DIAGNOSIS — R400 Somnolence: Secondary | ICD-10-CM | POA: Diagnosis not present

## 2016-09-18 DIAGNOSIS — Z794 Long term (current) use of insulin: Secondary | ICD-10-CM | POA: Diagnosis not present

## 2016-09-18 DIAGNOSIS — I6789 Other cerebrovascular disease: Secondary | ICD-10-CM | POA: Diagnosis not present

## 2016-09-18 DIAGNOSIS — I119 Hypertensive heart disease without heart failure: Secondary | ICD-10-CM | POA: Diagnosis not present

## 2016-09-18 DIAGNOSIS — E1142 Type 2 diabetes mellitus with diabetic polyneuropathy: Secondary | ICD-10-CM | POA: Diagnosis present

## 2016-09-18 DIAGNOSIS — I272 Pulmonary hypertension, unspecified: Secondary | ICD-10-CM | POA: Diagnosis not present

## 2016-09-18 DIAGNOSIS — I63532 Cerebral infarction due to unspecified occlusion or stenosis of left posterior cerebral artery: Secondary | ICD-10-CM | POA: Diagnosis present

## 2016-09-18 DIAGNOSIS — J90 Pleural effusion, not elsewhere classified: Secondary | ICD-10-CM | POA: Diagnosis not present

## 2016-09-18 DIAGNOSIS — Z951 Presence of aortocoronary bypass graft: Secondary | ICD-10-CM

## 2016-09-18 DIAGNOSIS — R4182 Altered mental status, unspecified: Secondary | ICD-10-CM | POA: Diagnosis not present

## 2016-09-18 DIAGNOSIS — I130 Hypertensive heart and chronic kidney disease with heart failure and stage 1 through stage 4 chronic kidney disease, or unspecified chronic kidney disease: Secondary | ICD-10-CM | POA: Diagnosis not present

## 2016-09-18 DIAGNOSIS — Z87891 Personal history of nicotine dependence: Secondary | ICD-10-CM | POA: Diagnosis not present

## 2016-09-18 DIAGNOSIS — J449 Chronic obstructive pulmonary disease, unspecified: Secondary | ICD-10-CM | POA: Diagnosis present

## 2016-09-18 DIAGNOSIS — Z8673 Personal history of transient ischemic attack (TIA), and cerebral infarction without residual deficits: Secondary | ICD-10-CM | POA: Diagnosis not present

## 2016-09-18 DIAGNOSIS — R269 Unspecified abnormalities of gait and mobility: Secondary | ICD-10-CM

## 2016-09-18 DIAGNOSIS — I110 Hypertensive heart disease with heart failure: Secondary | ICD-10-CM | POA: Diagnosis present

## 2016-09-18 DIAGNOSIS — N400 Enlarged prostate without lower urinary tract symptoms: Secondary | ICD-10-CM | POA: Diagnosis not present

## 2016-09-18 DIAGNOSIS — E860 Dehydration: Secondary | ICD-10-CM | POA: Diagnosis present

## 2016-09-18 DIAGNOSIS — R4701 Aphasia: Secondary | ICD-10-CM | POA: Diagnosis not present

## 2016-09-18 DIAGNOSIS — I639 Cerebral infarction, unspecified: Secondary | ICD-10-CM | POA: Diagnosis present

## 2016-09-18 DIAGNOSIS — E7800 Pure hypercholesterolemia, unspecified: Secondary | ICD-10-CM | POA: Diagnosis not present

## 2016-09-18 DIAGNOSIS — I739 Peripheral vascular disease, unspecified: Secondary | ICD-10-CM | POA: Diagnosis present

## 2016-09-18 DIAGNOSIS — I5023 Acute on chronic systolic (congestive) heart failure: Secondary | ICD-10-CM | POA: Diagnosis present

## 2016-09-18 DIAGNOSIS — I5022 Chronic systolic (congestive) heart failure: Secondary | ICD-10-CM | POA: Diagnosis not present

## 2016-09-18 DIAGNOSIS — Z89512 Acquired absence of left leg below knee: Secondary | ICD-10-CM | POA: Diagnosis not present

## 2016-09-18 DIAGNOSIS — R000 Tachycardia, unspecified: Secondary | ICD-10-CM | POA: Diagnosis not present

## 2016-09-18 DIAGNOSIS — I6350 Cerebral infarction due to unspecified occlusion or stenosis of unspecified cerebral artery: Secondary | ICD-10-CM | POA: Diagnosis not present

## 2016-09-18 DIAGNOSIS — R7989 Other specified abnormal findings of blood chemistry: Secondary | ICD-10-CM | POA: Diagnosis not present

## 2016-09-18 DIAGNOSIS — E785 Hyperlipidemia, unspecified: Secondary | ICD-10-CM | POA: Diagnosis present

## 2016-09-18 DIAGNOSIS — I255 Ischemic cardiomyopathy: Secondary | ICD-10-CM | POA: Diagnosis present

## 2016-09-18 DIAGNOSIS — E1129 Type 2 diabetes mellitus with other diabetic kidney complication: Secondary | ICD-10-CM | POA: Diagnosis present

## 2016-09-18 DIAGNOSIS — I350 Nonrheumatic aortic (valve) stenosis: Secondary | ICD-10-CM

## 2016-09-18 DIAGNOSIS — N183 Chronic kidney disease, stage 3 (moderate): Secondary | ICD-10-CM | POA: Diagnosis not present

## 2016-09-18 DIAGNOSIS — J9811 Atelectasis: Secondary | ICD-10-CM | POA: Diagnosis not present

## 2016-09-18 LAB — I-STAT TROPONIN, ED: TROPONIN I, POC: 0.8 ng/mL — AB (ref 0.00–0.08)

## 2016-09-18 LAB — CBC WITH DIFFERENTIAL/PLATELET
BASOS ABS: 0.1 10*3/uL (ref 0.0–0.1)
Basophils Relative: 1 %
EOS ABS: 0.1 10*3/uL (ref 0.0–0.7)
Eosinophils Relative: 1 %
HCT: 35.1 % — ABNORMAL LOW (ref 39.0–52.0)
Hemoglobin: 11 g/dL — ABNORMAL LOW (ref 13.0–17.0)
LYMPHS ABS: 2.8 10*3/uL (ref 0.7–4.0)
LYMPHS PCT: 19 %
MCH: 27.8 pg (ref 26.0–34.0)
MCHC: 31.3 g/dL (ref 30.0–36.0)
MCV: 88.6 fL (ref 78.0–100.0)
MONO ABS: 1.5 10*3/uL — AB (ref 0.1–1.0)
Monocytes Relative: 10 %
NEUTROS ABS: 10.1 10*3/uL — AB (ref 1.7–7.7)
Neutrophils Relative %: 69 %
PLATELETS: 302 10*3/uL (ref 150–400)
RBC: 3.96 MIL/uL — ABNORMAL LOW (ref 4.22–5.81)
RDW: 16.2 % — AB (ref 11.5–15.5)
WBC: 14.6 10*3/uL — ABNORMAL HIGH (ref 4.0–10.5)

## 2016-09-18 LAB — COMPREHENSIVE METABOLIC PANEL
ALT: 9 U/L — ABNORMAL LOW (ref 17–63)
AST: 18 U/L (ref 15–41)
Albumin: 3.7 g/dL (ref 3.5–5.0)
Alkaline Phosphatase: 46 U/L (ref 38–126)
Anion gap: 13 (ref 5–15)
BUN: 24 mg/dL — AB (ref 6–20)
CHLORIDE: 101 mmol/L (ref 101–111)
CO2: 21 mmol/L — ABNORMAL LOW (ref 22–32)
Calcium: 10 mg/dL (ref 8.9–10.3)
Creatinine, Ser: 1.42 mg/dL — ABNORMAL HIGH (ref 0.61–1.24)
GFR calc Af Amer: 55 mL/min — ABNORMAL LOW (ref >60)
GFR, EST NON AFRICAN AMERICAN: 47 mL/min — AB (ref >60)
Glucose, Bld: 280 mg/dL — ABNORMAL HIGH (ref 65–99)
POTASSIUM: 4.9 mmol/L (ref 3.5–5.1)
Sodium: 135 mmol/L (ref 135–145)
Total Bilirubin: 0.8 mg/dL (ref 0.3–1.2)
Total Protein: 8.1 g/dL (ref 6.5–8.1)

## 2016-09-18 LAB — CBG MONITORING, ED: GLUCOSE-CAPILLARY: 282 mg/dL — AB (ref 65–99)

## 2016-09-18 LAB — I-STAT CG4 LACTIC ACID, ED: LACTIC ACID, VENOUS: 3.55 mmol/L — AB (ref 0.5–1.9)

## 2016-09-18 MED ORDER — SODIUM CHLORIDE 0.9 % IV BOLUS (SEPSIS)
250.0000 mL | Freq: Once | INTRAVENOUS | Status: AC
  Administered 2016-09-19: 250 mL via INTRAVENOUS
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
## 2016-09-18 NOTE — ED Triage Notes (Addendum)
Yesterday family noticed left arm tremor, which was new. Patient stated his left shoulder was hurting and refused to be seen. Today has been confused, doing things that don't make sense, and seeming drowsy. Usually A&Ox4. In triage he is oriented to self and others, not time and city, states he feels dizzy. Grips equal both sides, no arm drift, face symmetrical, speech intact. Family also reports N/V today. Vomited x 1 and no appetite.

## 2016-09-18 NOTE — ED Notes (Signed)
Attempted to collect urine specimen, pt unable to go at this time. Will try later.

## 2016-09-18 NOTE — ED Notes (Signed)
MD at bedside. 

## 2016-09-19 ENCOUNTER — Other Ambulatory Visit (HOSPITAL_COMMUNITY): Payer: Medicare Other

## 2016-09-19 ENCOUNTER — Encounter (HOSPITAL_COMMUNITY): Payer: Self-pay | Admitting: Family Medicine

## 2016-09-19 ENCOUNTER — Observation Stay (HOSPITAL_COMMUNITY): Payer: Medicare Other

## 2016-09-19 ENCOUNTER — Emergency Department (HOSPITAL_COMMUNITY): Payer: Medicare Other

## 2016-09-19 ENCOUNTER — Inpatient Hospital Stay (HOSPITAL_COMMUNITY): Payer: Medicare Other

## 2016-09-19 DIAGNOSIS — R4182 Altered mental status, unspecified: Secondary | ICD-10-CM | POA: Diagnosis present

## 2016-09-19 DIAGNOSIS — J449 Chronic obstructive pulmonary disease, unspecified: Secondary | ICD-10-CM | POA: Diagnosis present

## 2016-09-19 DIAGNOSIS — I130 Hypertensive heart and chronic kidney disease with heart failure and stage 1 through stage 4 chronic kidney disease, or unspecified chronic kidney disease: Secondary | ICD-10-CM | POA: Diagnosis present

## 2016-09-19 DIAGNOSIS — Z951 Presence of aortocoronary bypass graft: Secondary | ICD-10-CM | POA: Diagnosis not present

## 2016-09-19 DIAGNOSIS — Z8673 Personal history of transient ischemic attack (TIA), and cerebral infarction without residual deficits: Secondary | ICD-10-CM | POA: Diagnosis not present

## 2016-09-19 DIAGNOSIS — I110 Hypertensive heart disease with heart failure: Secondary | ICD-10-CM | POA: Diagnosis present

## 2016-09-19 DIAGNOSIS — I739 Peripheral vascular disease, unspecified: Secondary | ICD-10-CM

## 2016-09-19 DIAGNOSIS — E785 Hyperlipidemia, unspecified: Secondary | ICD-10-CM

## 2016-09-19 DIAGNOSIS — I5032 Chronic diastolic (congestive) heart failure: Secondary | ICD-10-CM | POA: Diagnosis present

## 2016-09-19 DIAGNOSIS — E1142 Type 2 diabetes mellitus with diabetic polyneuropathy: Secondary | ICD-10-CM | POA: Diagnosis present

## 2016-09-19 DIAGNOSIS — Z794 Long term (current) use of insulin: Secondary | ICD-10-CM | POA: Diagnosis not present

## 2016-09-19 DIAGNOSIS — I2510 Atherosclerotic heart disease of native coronary artery without angina pectoris: Secondary | ICD-10-CM | POA: Diagnosis present

## 2016-09-19 DIAGNOSIS — R000 Tachycardia, unspecified: Secondary | ICD-10-CM | POA: Diagnosis not present

## 2016-09-19 DIAGNOSIS — R569 Unspecified convulsions: Secondary | ICD-10-CM | POA: Diagnosis not present

## 2016-09-19 DIAGNOSIS — I255 Ischemic cardiomyopathy: Secondary | ICD-10-CM | POA: Diagnosis not present

## 2016-09-19 DIAGNOSIS — I350 Nonrheumatic aortic (valve) stenosis: Secondary | ICD-10-CM | POA: Diagnosis not present

## 2016-09-19 DIAGNOSIS — Z85828 Personal history of other malignant neoplasm of skin: Secondary | ICD-10-CM | POA: Diagnosis not present

## 2016-09-19 DIAGNOSIS — R269 Unspecified abnormalities of gait and mobility: Secondary | ICD-10-CM | POA: Diagnosis not present

## 2016-09-19 DIAGNOSIS — I5022 Chronic systolic (congestive) heart failure: Secondary | ICD-10-CM | POA: Diagnosis not present

## 2016-09-19 DIAGNOSIS — I248 Other forms of acute ischemic heart disease: Secondary | ICD-10-CM | POA: Diagnosis present

## 2016-09-19 DIAGNOSIS — I6622 Occlusion and stenosis of left posterior cerebral artery: Secondary | ICD-10-CM | POA: Diagnosis not present

## 2016-09-19 DIAGNOSIS — I119 Hypertensive heart disease without heart failure: Secondary | ICD-10-CM

## 2016-09-19 DIAGNOSIS — Z87891 Personal history of nicotine dependence: Secondary | ICD-10-CM | POA: Diagnosis not present

## 2016-09-19 DIAGNOSIS — I639 Cerebral infarction, unspecified: Secondary | ICD-10-CM | POA: Diagnosis not present

## 2016-09-19 DIAGNOSIS — R7989 Other specified abnormal findings of blood chemistry: Secondary | ICD-10-CM

## 2016-09-19 DIAGNOSIS — N400 Enlarged prostate without lower urinary tract symptoms: Secondary | ICD-10-CM | POA: Diagnosis present

## 2016-09-19 DIAGNOSIS — E1151 Type 2 diabetes mellitus with diabetic peripheral angiopathy without gangrene: Secondary | ICD-10-CM | POA: Diagnosis present

## 2016-09-19 DIAGNOSIS — R4701 Aphasia: Secondary | ICD-10-CM | POA: Diagnosis not present

## 2016-09-19 DIAGNOSIS — J90 Pleural effusion, not elsewhere classified: Secondary | ICD-10-CM | POA: Diagnosis not present

## 2016-09-19 DIAGNOSIS — R400 Somnolence: Secondary | ICD-10-CM | POA: Diagnosis not present

## 2016-09-19 DIAGNOSIS — N183 Chronic kidney disease, stage 3 (moderate): Secondary | ICD-10-CM | POA: Diagnosis present

## 2016-09-19 DIAGNOSIS — I63532 Cerebral infarction due to unspecified occlusion or stenosis of left posterior cerebral artery: Secondary | ICD-10-CM | POA: Diagnosis not present

## 2016-09-19 DIAGNOSIS — I6350 Cerebral infarction due to unspecified occlusion or stenosis of unspecified cerebral artery: Secondary | ICD-10-CM | POA: Diagnosis not present

## 2016-09-19 DIAGNOSIS — I6789 Other cerebrovascular disease: Secondary | ICD-10-CM | POA: Diagnosis not present

## 2016-09-19 DIAGNOSIS — I272 Pulmonary hypertension, unspecified: Secondary | ICD-10-CM | POA: Diagnosis present

## 2016-09-19 DIAGNOSIS — Z89512 Acquired absence of left leg below knee: Secondary | ICD-10-CM | POA: Diagnosis not present

## 2016-09-19 DIAGNOSIS — E7800 Pure hypercholesterolemia, unspecified: Secondary | ICD-10-CM | POA: Diagnosis present

## 2016-09-19 DIAGNOSIS — I6501 Occlusion and stenosis of right vertebral artery: Secondary | ICD-10-CM | POA: Diagnosis not present

## 2016-09-19 DIAGNOSIS — E872 Acidosis: Secondary | ICD-10-CM | POA: Diagnosis present

## 2016-09-19 DIAGNOSIS — E1122 Type 2 diabetes mellitus with diabetic chronic kidney disease: Secondary | ICD-10-CM | POA: Diagnosis not present

## 2016-09-19 LAB — URINE MICROSCOPIC-ADD ON

## 2016-09-19 LAB — CBC WITH DIFFERENTIAL/PLATELET
BASOS ABS: 0.1 10*3/uL (ref 0.0–0.1)
Basophils Relative: 1 %
Eosinophils Absolute: 0.1 10*3/uL (ref 0.0–0.7)
Eosinophils Relative: 1 %
HEMATOCRIT: 31.5 % — AB (ref 39.0–52.0)
HEMOGLOBIN: 10.1 g/dL — AB (ref 13.0–17.0)
LYMPHS PCT: 22 %
Lymphs Abs: 2.4 10*3/uL (ref 0.7–4.0)
MCH: 28.1 pg (ref 26.0–34.0)
MCHC: 32.1 g/dL (ref 30.0–36.0)
MCV: 87.5 fL (ref 78.0–100.0)
MONOS PCT: 8 %
Monocytes Absolute: 0.9 10*3/uL (ref 0.1–1.0)
NEUTROS ABS: 7.2 10*3/uL (ref 1.7–7.7)
Neutrophils Relative %: 68 %
Platelets: 298 10*3/uL (ref 150–400)
RBC: 3.6 MIL/uL — ABNORMAL LOW (ref 4.22–5.81)
RDW: 16.2 % — ABNORMAL HIGH (ref 11.5–15.5)
WBC: 10.7 10*3/uL — ABNORMAL HIGH (ref 4.0–10.5)

## 2016-09-19 LAB — COMPREHENSIVE METABOLIC PANEL
ALBUMIN: 3.4 g/dL — AB (ref 3.5–5.0)
ALK PHOS: 41 U/L (ref 38–126)
ALT: 8 U/L — ABNORMAL LOW (ref 17–63)
ANION GAP: 10 (ref 5–15)
AST: 16 U/L (ref 15–41)
BILIRUBIN TOTAL: 0.6 mg/dL (ref 0.3–1.2)
BUN: 22 mg/dL — AB (ref 6–20)
CALCIUM: 9.6 mg/dL (ref 8.9–10.3)
CO2: 24 mmol/L (ref 22–32)
CREATININE: 1.27 mg/dL — AB (ref 0.61–1.24)
Chloride: 101 mmol/L (ref 101–111)
GFR calc Af Amer: >60 mL/min (ref >60)
GFR calc non Af Amer: 54 mL/min — ABNORMAL LOW (ref >60)
GLUCOSE: 289 mg/dL — AB (ref 65–99)
Potassium: 4.2 mmol/L (ref 3.5–5.1)
Sodium: 135 mmol/L (ref 135–145)
TOTAL PROTEIN: 7.8 g/dL (ref 6.5–8.1)

## 2016-09-19 LAB — TROPONIN I
TROPONIN I: 1.44 ng/mL — AB (ref <0.03)
Troponin I: 1.73 ng/mL (ref <0.03)
Troponin I: 1.72 ng/mL (ref <0.03)
Troponin I: 1.62 ng/mL (ref <0.03)

## 2016-09-19 LAB — GLUCOSE, CAPILLARY
GLUCOSE-CAPILLARY: 197 mg/dL — AB (ref 65–99)
GLUCOSE-CAPILLARY: 208 mg/dL — AB (ref 65–99)
Glucose-Capillary: 266 mg/dL — ABNORMAL HIGH (ref 65–99)
Glucose-Capillary: 192 mg/dL — ABNORMAL HIGH (ref 65–99)

## 2016-09-19 LAB — URINALYSIS, ROUTINE W REFLEX MICROSCOPIC
BILIRUBIN URINE: NEGATIVE
GLUCOSE, UA: NEGATIVE mg/dL
HGB URINE DIPSTICK: NEGATIVE
Ketones, ur: NEGATIVE mg/dL
Leukocytes, UA: NEGATIVE
Nitrite: NEGATIVE
PH: 5.5 (ref 5.0–8.0)
Protein, ur: >300 mg/dL — AB
SPECIFIC GRAVITY, URINE: 1.024 (ref 1.005–1.030)

## 2016-09-19 LAB — I-STAT CG4 LACTIC ACID, ED: LACTIC ACID, VENOUS: 2.28 mmol/L — AB (ref 0.5–1.9)

## 2016-09-19 LAB — LIPID PANEL
Cholesterol: 153 mg/dL (ref 0–200)
HDL: 20 mg/dL — AB (ref >40)
LDL CALC: 103 mg/dL — AB (ref 0–99)
TRIGLYCERIDES: 150 mg/dL — AB (ref <150)
Total CHOL/HDL Ratio: 7.7 RATIO
VLDL: 30 mg/dL (ref 0–40)

## 2016-09-19 LAB — PHOSPHORUS: Phosphorus: 3 mg/dL (ref 2.5–4.6)

## 2016-09-19 LAB — LACTIC ACID, PLASMA: Lactic Acid, Venous: 1.9 mmol/L (ref 0.5–1.9)

## 2016-09-19 LAB — MAGNESIUM: Magnesium: 1.8 mg/dL (ref 1.7–2.4)

## 2016-09-19 LAB — PROCALCITONIN: Procalcitonin: 0.11 ng/mL

## 2016-09-19 MED ORDER — GABAPENTIN 300 MG PO CAPS
300.0000 mg | ORAL_CAPSULE | Freq: Three times a day (TID) | ORAL | Status: DC
  Administered 2016-09-19 – 2016-09-23 (×14): 300 mg via ORAL
  Filled 2016-09-19 (×14): qty 1

## 2016-09-19 MED ORDER — ACETAMINOPHEN 650 MG RE SUPP: 650.0000 mg | RECTAL | Status: DC | PRN

## 2016-09-19 MED ORDER — ENOXAPARIN SODIUM 40 MG/0.4ML ~~LOC~~ SOLN
40.0000 mg | Freq: Every day | SUBCUTANEOUS | Status: DC
  Administered 2016-09-19 – 2016-09-23 (×5): 40 mg via SUBCUTANEOUS
  Filled 2016-09-19 (×5): qty 0.4

## 2016-09-19 MED ORDER — ASPIRIN 300 MG RE SUPP: 300.0000 mg | Freq: Every day | RECTAL | Status: DC

## 2016-09-19 MED ORDER — TAMSULOSIN HCL 0.4 MG PO CAPS
0.4000 mg | ORAL_CAPSULE | Freq: Every day | ORAL | Status: DC
  Administered 2016-09-20 – 2016-09-23 (×5): 0.4 mg via ORAL
  Filled 2016-09-19 (×5): qty 1

## 2016-09-19 MED ORDER — INSULIN NPH (HUMAN) (ISOPHANE) 100 UNIT/ML ~~LOC~~ SUSP
17.0000 [IU] | Freq: Two times a day (BID) | SUBCUTANEOUS | Status: DC
  Administered 2016-09-19 – 2016-09-23 (×7): 17 [IU] via SUBCUTANEOUS
  Filled 2016-09-19: qty 10

## 2016-09-19 MED ORDER — CLOPIDOGREL BISULFATE 75 MG PO TABS
75.0000 mg | ORAL_TABLET | Freq: Every day | ORAL | Status: DC
  Administered 2016-09-19 – 2016-09-23 (×5): 75 mg via ORAL
  Filled 2016-09-19 (×5): qty 1

## 2016-09-19 MED ORDER — INSULIN ASPART 100 UNIT/ML ~~LOC~~ SOLN
0.0000 [IU] | Freq: Three times a day (TID) | SUBCUTANEOUS | Status: DC
  Administered 2016-09-19 (×2): 2 [IU] via SUBCUTANEOUS
  Administered 2016-09-19: 5 [IU] via SUBCUTANEOUS
  Administered 2016-09-20 – 2016-09-21 (×3): 3 [IU] via SUBCUTANEOUS
  Administered 2016-09-21: 7 [IU] via SUBCUTANEOUS
  Administered 2016-09-22: 3 [IU] via SUBCUTANEOUS
  Administered 2016-09-22: 5 [IU] via SUBCUTANEOUS
  Administered 2016-09-23: 3 [IU] via SUBCUTANEOUS
  Administered 2016-09-23: 2 [IU] via SUBCUTANEOUS

## 2016-09-19 MED ORDER — INSULIN ASPART 100 UNIT/ML ~~LOC~~ SOLN
0.0000 [IU] | Freq: Every day | SUBCUTANEOUS | Status: DC
  Administered 2016-09-19 – 2016-09-20 (×2): 2 [IU] via SUBCUTANEOUS
  Administered 2016-09-21 – 2016-09-22 (×2): 4 [IU] via SUBCUTANEOUS

## 2016-09-19 MED ORDER — TRAMADOL HCL 50 MG PO TABS: 50.0000 mg | ORAL_TABLET | Freq: Four times a day (QID) | ORAL | Status: DC | PRN

## 2016-09-19 MED ORDER — IOPAMIDOL (ISOVUE-370) INJECTION 76%
INTRAVENOUS | Status: AC
  Administered 2016-09-19: 50 mL
  Filled 2016-09-19: qty 50

## 2016-09-19 MED ORDER — SENNOSIDES-DOCUSATE SODIUM 8.6-50 MG PO TABS: 1.0000 | ORAL_TABLET | Freq: Every evening | ORAL | Status: DC | PRN

## 2016-09-19 MED ORDER — BACLOFEN 10 MG PO TABS
10.0000 mg | ORAL_TABLET | Freq: Every day | ORAL | Status: DC
  Administered 2016-09-19 – 2016-09-23 (×5): 10 mg via ORAL
  Filled 2016-09-19 (×5): qty 1

## 2016-09-19 MED ORDER — EZETIMIBE 10 MG PO TABS
10.0000 mg | ORAL_TABLET | Freq: Every day | ORAL | Status: DC
  Administered 2016-09-19 – 2016-09-23 (×5): 10 mg via ORAL
  Filled 2016-09-19 (×5): qty 1

## 2016-09-19 MED ORDER — STROKE: EARLY STAGES OF RECOVERY BOOK
Freq: Once | Status: AC
  Administered 2016-09-19: 05:00:00
  Filled 2016-09-19: qty 1

## 2016-09-19 MED ORDER — DEXTROSE 5 % IV SOLN
1.0000 g | Freq: Once | INTRAVENOUS | Status: AC
  Administered 2016-09-19: 1 g via INTRAVENOUS
  Filled 2016-09-19: qty 10

## 2016-09-19 MED ORDER — ASPIRIN 325 MG PO TABS
325.0000 mg | ORAL_TABLET | Freq: Every day | ORAL | Status: DC
  Administered 2016-09-19 – 2016-09-23 (×5): 325 mg via ORAL
  Filled 2016-09-19 (×5): qty 1

## 2016-09-19 MED ORDER — ACETAMINOPHEN 325 MG PO TABS
650.0000 mg | ORAL_TABLET | ORAL | Status: DC | PRN
  Administered 2016-09-21: 650 mg via ORAL
  Filled 2016-09-19: qty 2

## 2016-09-19 MED ORDER — DEXTROSE 5 % IV SOLN
500.0000 mg | Freq: Once | INTRAVENOUS | Status: AC
  Administered 2016-09-19: 500 mg via INTRAVENOUS
  Filled 2016-09-19: qty 500

## 2016-09-19 NOTE — ED Notes (Signed)
Dr. Kirkpatrick at bedside 

## 2016-09-19 NOTE — Progress Notes (Signed)
RN attempted to complete two hour neurological assessment and obtain patient's temperature (other vital signs being obtained automatically via bedside wall monitor). Patient was sleeping when RN entered room.  RN rubbed patient's arm and spoke softly to patient, patient barely opened eyes.  RN sternal rubbed patient and asked patient to open his eyes.  Patient opened eyes up slightly and pushed RN's hand off chest. RN explained that it was time to check patient's temperature again.  RN attempted to check patient's temperature orally and patient turned head away from RN and pushed thermometer away with left hand.    Patient did squeeze RN's fingers at this time and grip was moderate in each.  Other than hand grips patient uncooperative with participating in any other portion of neurological exam.
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
## 2016-09-19 NOTE — Progress Notes (Signed)
PT Cancellation Note  Patient Details Name: Anthony Holder MRN: [id number] DOB: 1941-12-19   Cancelled Treatment:    Reason Eval/Treat Not Completed: Patient at procedure or test/unavailable, spoke with nsg, will follow up next day   Duncan Dull 09/19/2016, 11:02 AM

## 2016-09-19 NOTE — Progress Notes (Signed)
PROGRESS NOTE    Anthony Holder  [id number] DOB: 03/30/1941 DOA: 09/18/2016 PCP: [name], MD   Brief Narrative:  Anthony Holder is a 75 y.o. male with a history of CAD s/p CABG x5, severe AS with pulmonary HTN, COPD, DM, PVD followed by [name], CKD stage III, HTN, HLD,  carotid artery disease s/p R CEA, Ischemic Cardiomyopathy and Systolic CHF with an EF of 25%, Left BKA and Transmetatarsal amputation and other comorbidities who presented with confusion x 1 day. He was worked up and admitted and found to have an Acute Infarct within the Left PCA. Neurology was consulted and Stroke Team Following. Cardiology Consulted to make recommendations about elevated Troponin's.  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 Acute Infarct within the Left Posterior Cerebral Artery  2. Elevated Troponin's  3. Lactic Acidosis, resolved 4. Hypertension 5. Hyperlipidemia 6. Ischemic Cardiomyopathy with EF of 25% on last Echo 7. Severe Aortic Stenosis 8. Chronic Kidney Disease Stage 3 9. Peripherial Vascular Disease 10. 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 Acute Infarct within the Left Posterior Cerebral Artery  Neurology Stroke Team Consulted and Appreciate Recommendations. Discussed Case with Dr. Erlinda Hong. Continue to follow Neurology Recommendations  -CT Head showed No acute intracranial abnormalities. Chronic atrophy and small vessel ischemic changes.  -MRI of Head Showed 1. Acute infarct within the left posterior cerebral artery distribution. No  hemorrhage identified. 2. Background of moderate chronic microvascular ischemic changes and parenchymal volume loss. 3. 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  -CT Angio of Head and Neck showed: Left PCA occlusion in the distal P2 segment, corresponding to the acute left PCA infarct. 2. Right Vertebral Artery occlusion (short segment reconstitution from muscular branches in the distal V2 segment). 3. 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 4. Right PCA P1 segment occlusion and poor reconstitution of the right PCA branches from the right posterior communicating artery which is highly atherosclerotic and stenotic. 5. Radiographic string sign stenosis of the Right ICA siphon in the distal petrous segment. At least moderate tandem stenoses from dense calcified plaque in the right cavernous and supraclinoid ICA segments, but the right ICA terminus remains patent. 6. High-grade stenosis versus short segment occlusion of the Left ICA supraclinoid segment. The left ICA terminus is patent but might be supplied from the left posterior communicating artery. 7. Mild to moderate bilateral MCA irregularity and stenosis, including moderate stenosis at the left MCA bifurcation. 8. Left greater than right carotid bifurcation atherosclerosis. Up to 60% left ICA bulb stenosis. 9. Bilateral pleural effusions with pulmonary ground-glass opacity and reactive appearing superior mediastinal lymphadenopathy. This constellation might reflect congestive heart failure with pulmonary edema. 10. Stable CT appearance of the brain.  -Transthoracic Echocardiogram ordered and pending  -EEG and UDS Pending  -LE Dopplers Preliminary Report showed There is no DVT or SVT noted in the visualized veins of the bilateral lower extremities -Lipid Panel showed Cholesterol of 153,  TG of 150, HDL of  20, LDL of 103, and VLDL of 30 -Dual Antiplatelet Therapy with ASA 325 mg po Daily and Clopidogrel 75 mg po Daily -HbA1c Pending -PT/OT/SLP Evaluate and treat - PT will eval again -Neurochecks per protocol -Continue with Telemetry -Allow for Permissive HTN  2. Elevated Troponins -EKG showed some mild ST Depression on my Read -Troponins Trended to 1.73 -> 1.72 -> 1.62 -Appreciate Cardiology Consult Dr. Rosezella [place] Evaluation and Recommendations. - Cardiology Thinks it is unlikely ACS. Troponin elevation could be 2/2 to Acute CVA and CKD  3. Lactic Acidosis, Resolved -Continue to Hold Metformin -D/C'd Abx of Ceftriaxone and Azithromycin -LA went from 2.28 -> 1.9 -Procalcitonin 0.11; No WBC and patients afebrile unlikely Infectious and Sepsis  4. Hypertension -Allow for Permissive HTN -Hold Carvedilol and Furosemide -No ACE/ARB because of CKD  5. Hyperlipidemia -Lipid Panel showed Cholesterol of 153, TG of 150, HDL of 20, LDL of 103, and VLDL of 30 -C/w Ezetimbe 10 mg po Daily as patient is Statin Intolerant -Referral made to [facility] by Cardiology as Patient may be candidate for PCSK9 Candidate.  6. Ischemic Cardiomyopathy with EF of 25%, currently not in exacerbation -Repeat ECHO -Hold Carvediolol and Furosemide -No ACE/ARB 2/2 to CKD  7. Severe Aortic Stenosis -Considered and Evaluated for TAVR after her recovers from his illness and recent R Transmetatarsal Surgery.   8. CKD Stage 3 -BUN/Cr on Repeat Labwork was 22/1.27 respectively -Repeat BMP in AM  9. Peripheral Vascular Disease -C/w Plavix 75 mg and Zetia 10 mg   10. Insulin Dependent Diabetes Mellitus Type 2 complicated with Peripheral Neuropathy -CBG was 289 -C/w Novolog SSI AC and HS along with Insulin NPH 17 Units sq BID -Hypoglycemia Protocol in Place -Metformin held   DVT prophylaxis: Lovenox Code Status: FULL Family Communication: Discussed Plan of Care with Patient's wife    Disposition Plan: Patient will likely be D/C'd to Home Health or SNF  Consultants:   -Neurology Stroke Team Dr. Erlinda Hong  -Cardiology Dr. Percival Spanish  Procedures: ECHO Pending Antimicrobials: D/C'd Ceftriaxone and Azithromycin  Subjective: Patient was seen and examined at bedside this AM and he was still a little confused. Denied Cp,, SOB, N/V. Had his wife at bedside with him. No other complaints or concerns.   Objective: Vitals:   09/19/16 1100 09/19/16 1258 09/19/16 1404 09/19/16 1700  BP: 116/76 113/69 (!) 90/58 (!) 103/50  Pulse:   93   Resp:      Temp:   98 F (36.7 C)   TempSrc:   Axillary   SpO2: 98% 100% 100% 99%  Weight:      Height:        Intake/Output Summary (Last 24 hours) at 09/19/16 1819 Last data filed at 09/19/16 1300  Gross per 24 hour  Intake             1150 ml  Output              250 ml  Net              900 ml   Filed Weights   09/18/16 2107 09/19/16 0455  Weight: 84.8 kg (187 lb) 82.9 kg (182 lb 12.8 oz)    Examination: Physical Exam:  Constitutional:  Elderly sick appearing Caucasian male who looks older than stated age.  Eyes:  lids and conjunctivae normal, sclerae anicteric  ENMT: External Ears, Nose appear normal. No dentition as patient didn't bring teeth.  Neck: Appears normal, supple, no cervical masses, normal ROM, no appreciable  thyromegaly no JVD appreciated.  Respiratory: Clear to auscultation bilaterally, no wheezing, rales, rhonchi or crackles. Normal respiratory effort and patient is not tachypenic. No accessory muscle use.  Cardiovascular: RRR, Loud 4/6 Systolic Murmur best heard at RSB. No rubs / gallops. S1 and S2 auscultated. No extremity edema.  Abdomen: Soft, non-tender, non-distended. No masses palpated. No appreciable hepatosplenomegaly. Bowel sounds positive.  GU: Deferred. Musculoskeletal: No clubbing / cyanosis of digits/nails. Patient with Left BKA and with Right Transmetatarsal Amputation that was wrapped. . No  contractures. Normal strength and muscle tone in Upper Extremities.  Skin: No rashes, lesions, ulcers. No induration; Warm and dry.  Neurologic: CN 2-12 grossly intact with no focal deficits. Sensation intact.  Strength 5/5 in Upper Extemities. Romberg sign cerebellar reflexes not assessed.  Psychiatric: Normal judgment and insight. Alert, awake, and oriented x 3. Normal mood and Flat affect.   Data Reviewed: I have reviewed the following.  CBC:  Recent Labs Lab 09/18/16 2114 09/19/16 0847  WBC 14.6* 10.7*  NEUTROABS 10.1* 7.2  HGB 11.0* 10.1*  HCT 35.1* 31.5*  MCV 88.6 87.5  PLT 302 [id number]   Basic Metabolic Panel:  Recent Labs Lab 09/18/16 2113 09/19/16 0847  NA 135 135  K 4.9 4.2  CL 101 101  CO2 21* 24  GLUCOSE 280* 289*  BUN 24* 22*  CREATININE 1.42* 1.27*  CALCIUM 10.0 9.6  MG  --  1.8  PHOS  --  3.0   GFR: Estimated Creatinine Clearance: 52.7 mL/min (by C-G formula based on SCr of 1.27 mg/dL (H)). Liver Function Tests:  Recent Labs Lab 09/18/16 2113 09/19/16 0847  AST 18 16  ALT 9* 8*  ALKPHOS 46 41  BILITOT 0.8 0.6  PROT 8.1 7.8  ALBUMIN 3.7 3.4*   No results for input(s): LIPASE, AMYLASE in the last 168 hours. No results for input(s): AMMONIA in the last 168 hours. Coagulation Profile: No results for input(s): INR, PROTIME in the last 168 hours. Cardiac Enzymes:  Recent Labs Lab 09/19/16 0425 09/19/16 0847 09/19/16 1319  TROPONINI 1.73* 1.72* 1.62*   BNP (last 3 results) No results for input(s): PROBNP in the last 8760 hours. HbA1C: No results for input(s): HGBA1C in the last 72 hours. CBG:  Recent Labs Lab 09/18/16 2122 09/19/16 0740 09/19/16 1258 09/19/16 1622  GLUCAP 282* 266* 192* 197*   Lipid Profile:  Recent Labs  09/19/16 0425  CHOL 153  HDL 20*  LDLCALC 103*  TRIG 150*  CHOLHDL 7.7   Thyroid Function Tests: No results for input(s): TSH, T4TOTAL, FREET4, T3FREE, THYROIDAB in the last 72 hours. Anemia Panel: No  results for input(s): VITAMINB12, FOLATE, FERRITIN, TIBC, IRON, RETICCTPCT in the last 72 hours. Sepsis Labs:  Recent Labs Lab 09/18/16 2133 09/19/16 0053 09/19/16 0425  PROCALCITON  --   --  0.11  LATICACIDVEN 3.55* 2.28* 1.9    No results found for this or any previous visit (from the past 240 hour(s)).   Radiology Studies: Ct Angio Head W Or Wo Contrast  Addendum Date: 09/19/2016   ADDENDUM REPORT: 09/19/2016 14:12 ADDENDUM: Study discussed by telephone with Dr. Rosalin Hawking on 09/19/2016 at 1350 hours. Electronically Signed   By: Genevie Ann M.D.   On: 09/19/2016 14:12   Result Date: 09/19/2016 CLINICAL DATA:  75 year old male with left PCA infarct discovered on MRI for acute confusion. Initial encounter. EXAM: CT ANGIOGRAPHY HEAD AND NECK TECHNIQUE: Multidetector CT imaging of the head and neck was performed using the standard protocol during  bolus administration of intravenous contrast. Multiplanar CT image reconstructions and MIPs were obtained to evaluate the vascular anatomy. Carotid stenosis measurements (when applicable) are obtained utilizing NASCET criteria, using the distal internal carotid diameter as the denominator. CONTRAST:  50 mL Isovue 370 COMPARISON:  Brain MRI 0133 hours today, and earlier. FINDINGS: CTA NECK Skeleton: Previous cervical C3-C4 absent dentition. No acute osseous abnormality identified. Visualized paranasal sinuses and mastoids are stable and well pneumatized. And C4-C5 ACDF. Previous median sternotomy. Upper chest: Small layering bilateral pleural effusions, that on the left also tracks into the major fissure. Septal thickening and mosaic attenuation in the bilateral lung parenchyma. Right peritracheal lymphadenopathy with individual nodes measuring 16 mm. Anterior carina nodes measure up to 13 mm. Sequelae of CABG. Other neck: Partially calcified 16 mm left thyroid hypodense nodule which meets consensus criteria for ultrasound follow-up. Negative larynx, pharynx,  parapharyngeal spaces, retropharyngeal space, sublingual space, submandibular glands and parotid glands. No cervical lymphadenopathy. Aortic arch: Calcified aortic atherosclerosis. Three vessel arch configuration. Soft arch atherosclerosis as well. Right carotid system: Right CCA soft and calcified plaque resulting in stenosis of less than 50 % with respect to the distal vessel. Soft plaque continues in the posterior right CCA. There is soft plaque with less than 50% stenosis just proximal to the right carotid bifurcation. At the bifurcation there is soft and calcified plaque. No right ICA origin or bulb stenosis. There is soft and calcified plaque at the right skullbase resulting in less than 50 % stenosis with respect to the distal vessel. Left carotid system: No left CCA origin stenosis. Soft plaque intermittently in the left CCA proximal to the bifurcation. Soft and calcified plaque at the bifurcation resulting in stenosis in the bulb up to 55-60 % with respect to the distal vessel. See series 401, image 87. There is soft plaque in the left ICA at the skullbase resulting in. Less than 50 % stenosis with respect to the distal vessel Vertebral arteries: No proximal right subclavian artery stenosis despite soft and calcified plaque. 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 There is a calcification at the origin. The right vertebral artery is occluded up to the C2-C3 level where it appears to be mildly reconstituted from muscular branches, but is highly diminutive and terminates outside the skull. Soft plaque in the proximal left subclavian artery with less than 50 % stenosis with respect to the distal vessel. Soft and calcified plaque at the left vertebral artery origin with severe stenosis (series 405, image 121). Tortuous left V1 segment. Minimal plaque in the left V2 segment. Mild left V3 segment plaque without significant stenosis. CTA HEAD Posterior circulation: Patent distal left vertebral artery which  appears somewhat dominant has no stenosis to the basilar. There is mild left V4 segment plaque. Both a ICAs appear dominant. The distal right vertebral artery is occluded. There is moderate basilar artery irregularity and mild stenosis of the basilar just distal to the AICA origins. The basilar remains patent. The SCA and left PCA origins are normal. The left posterior communicating artery is present. The left PCA is occluded in the distal P2 segment with no reconstituted flow. The right P1 is occluded but the right posterior communicating artery is patent although moderately to severely irregular and stenotic. Still, the right PCA branches are patent but attenuated. Anterior circulation: The right ICA siphon is patent but there is radiographic string sign stenosis of the siphon in the vertical petrous segment (series 402, image 249 and at least moderate superimposed stenosis  in the cavernous and supraclinoid segments. Still, the right ICA terminus is patent. The right posterior communicating and right ophthalmic artery origins are normal. The left ICA siphon is patent also with moderate to severe atherosclerosis. There is moderate stenosis in the left cavernous segment and severe stenosis in the left supraclinoid ICA related to confluent calcified plaque. In fat, there might be a short segment occlusion of the supraclinoid ICA segment, with the left ICA terminus supplied from the left posterior communicating artery. Nevertheless, both carotid termini are patent. The left MCA and ACA origins are within normal limits. The right ACA A1 segment is diminutive or absent. The anterior communicating artery and bilateral ACA branches are within normal limits. The right MCA origin is mildly irregular and stenotic. The right M1 remains patent. The right MCA bifurcation is patent. There is up to moderate stenosis at the origin of the anterior right MCA M2 branch. Other visualized right MCA branches are within normal limits.  The left MCA proximal M1 segment is mildly irregular and stenotic. There is moderate irregularity and stenosis at the left MCA bifurcation. The left M2 branches remain patent, and are within normal limits. Venous sinuses: Negative. Anatomic variants: Dominant left ACA A1 segment. Delayed phase: Left PCA infarct remain subtle by CT. No abnormal enhancement identified. Review of the MIP images confirms the above findings IMPRESSION: 1. Left PCA occlusion in the distal P2 segment, corresponding to the acute left PCA infarct. 2. Right Vertebral Artery occlusion (short segment reconstitution from muscular branches in the distal V2 segment). 3. 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 4. Right PCA P1 segment occlusion and poor reconstitution of the right PCA branches from the right posterior communicating artery which is highly atherosclerotic and stenotic. 5. Radiographic string sign stenosis of the Right ICA siphon in the distal petrous segment. At least moderate tandem stenoses from dense calcified plaque in the right cavernous and supraclinoid ICA segments, but the right ICA terminus remains patent. 6. High-grade stenosis versus short segment occlusion of the Left ICA supraclinoid segment. The left ICA terminus is patent but might be supplied from the left posterior communicating artery. 7. Mild to moderate bilateral MCA irregularity and stenosis, including moderate stenosis at the left MCA bifurcation. 8. Left greater than right carotid bifurcation atherosclerosis. Up to 60% left ICA bulb stenosis. 9. Bilateral pleural effusions with pulmonary ground-glass opacity and reactive appearing superior mediastinal lymphadenopathy. This constellation might reflect congestive heart failure with pulmonary edema. 10. Stable CT appearance of the brain. Electronically Signed: By: Genevie Ann M.D. On: 09/19/2016 13:44   Dg Chest 2 View  Result  Date: 09/18/2016 CLINICAL DATA:  New left arm tremor. Pain in the left shoulder. Confusion today. Nausea and vomiting. Abnormal EKG. EXAM: CHEST  2 VIEW COMPARISON:  06/05/2016 FINDINGS: Postoperative changes in the mediastinum. Heart size and pulmonary vascularity are normal for technique. Blunting of the left costophrenic angle suggesting small pleural effusion. Mild atelectasis in the left face. Right lung is clear. No pneumothorax calcification of the aorta. Degenerative changes in the spine and shoulders. Postoperative change in the cervical spine. IMPRESSION: Small left pleural effusion with basilar atelectasis. Electronically Signed   By: Lucienne Capers M.D.   On: 09/18/2016 22:35   Ct Head Wo Contrast  Result Date: 09/19/2016 CLINICAL DATA:  Altered mental status. New left arm tremor. Left shoulder pain. Confusion today. EXAM: CT HEAD WITHOUT CONTRAST TECHNIQUE: Contiguous axial  images were obtained from the base of the skull through the vertex without intravenous contrast. COMPARISON:  CT head 05/14/2013.  MRI brain 05/15/2013. FINDINGS: Brain: Diffuse cerebral atrophy. Ventricular dilatation consistent with central atrophy. Low-attenuation changes in the deep white matter consistent small vessel ischemia. No mass effect or midline shift. No abnormal extra-axial fluid collections. Gray-white matter junctions are distinct. Basal cisterns are not effaced. No evidence of acute intracranial hemorrhage. Vascular: Atherosclerotic vascular calcifications are present. Skull: Normal. Negative for fracture or focal lesion. Sinuses/Orbits: Visualized paranasal sinuses are clear. Partial opacification of some of the right mastoid air cells. Left mastoids are clear. Other: No significant changes since previous study. IMPRESSION: No acute intracranial abnormalities. Chronic atrophy and small vessel ischemic changes. Electronically Signed   By: Lucienne Capers M.D.   On: 09/19/2016 00:23   Ct Angio Neck W Or Wo  Contrast  Addendum Date: 09/19/2016   ADDENDUM REPORT: 09/19/2016 14:12 ADDENDUM: Study discussed by telephone with Dr. Rosalin Hawking on 09/19/2016 at 1350 hours. Electronically Signed   By: Genevie Ann M.D.   On: 09/19/2016 14:12   Result Date: 09/19/2016 CLINICAL DATA:  75 year old male with left PCA infarct discovered on MRI for acute confusion. Initial encounter. EXAM: CT ANGIOGRAPHY HEAD AND NECK TECHNIQUE: Multidetector CT imaging of the head and neck was performed using the standard protocol during bolus administration of intravenous contrast. Multiplanar CT image reconstructions and MIPs were obtained to evaluate the vascular anatomy. Carotid stenosis measurements (when applicable) are obtained utilizing NASCET criteria, using the distal internal carotid diameter as the denominator. CONTRAST:  50 mL Isovue 370 COMPARISON:  Brain MRI 0133 hours today, and earlier. FINDINGS: CTA NECK Skeleton: Previous cervical C3-C4 absent dentition. No acute osseous abnormality identified. Visualized paranasal sinuses and mastoids are stable and well pneumatized. And C4-C5 ACDF. Previous median sternotomy. Upper chest: Small layering bilateral pleural effusions, that on the left also tracks into the major fissure. Septal thickening and mosaic attenuation in the bilateral lung parenchyma. Right peritracheal lymphadenopathy with individual nodes measuring 16 mm. Anterior carina nodes measure up to 13 mm. Sequelae of CABG. Other neck: Partially calcified 16 mm left thyroid hypodense nodule which meets consensus criteria for ultrasound follow-up. Negative larynx, pharynx, parapharyngeal spaces, retropharyngeal space, sublingual space, submandibular glands and parotid glands. No cervical lymphadenopathy. Aortic arch: Calcified aortic atherosclerosis. Three vessel arch configuration. Soft arch atherosclerosis as well. Right carotid system: Right CCA soft and calcified plaque resulting in stenosis of less than 50 % with respect to the  distal vessel. Soft plaque continues in the posterior right CCA. There is soft plaque with less than 50% stenosis just proximal to the right carotid bifurcation. At the bifurcation there is soft and calcified plaque. No right ICA origin or bulb stenosis. There is soft and calcified plaque at the right skullbase resulting in less than 50 % stenosis with respect to the distal vessel. Left carotid system: No left CCA origin stenosis. Soft plaque intermittently in the left CCA proximal to the bifurcation. Soft and calcified plaque at the bifurcation resulting in stenosis in the bulb up to 55-60 % with respect to the distal vessel. See series 401, image 87. There is soft plaque in the left ICA at the skullbase resulting in. Less than 50 % stenosis with respect to the distal vessel Vertebral arteries: No proximal right subclavian artery stenosis despite soft and calcified plaque. 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 There is a calcification at the origin. The right vertebral artery is occluded  up to the C2-C3 level where it appears to be mildly reconstituted from muscular branches, but is highly diminutive and terminates outside the skull. Soft plaque in the proximal left subclavian artery with less than 50 % stenosis with respect to the distal vessel. Soft and calcified plaque at the left vertebral artery origin with severe stenosis (series 405, image 121). Tortuous left V1 segment. Minimal plaque in the left V2 segment. Mild left V3 segment plaque without significant stenosis. CTA HEAD Posterior circulation: Patent distal left vertebral artery which appears somewhat dominant has no stenosis to the basilar. There is mild left V4 segment plaque. Both a ICAs appear dominant. The distal right vertebral artery is occluded. There is moderate basilar artery irregularity and mild stenosis of the basilar just distal to the AICA origins. The basilar remains patent. The SCA and left PCA origins are normal. The left  posterior communicating artery is present. The left PCA is occluded in the distal P2 segment with no reconstituted flow. The right P1 is occluded but the right posterior communicating artery is patent although moderately to severely irregular and stenotic. Still, the right PCA branches are patent but attenuated. Anterior circulation: The right ICA siphon is patent but there is radiographic string sign stenosis of the siphon in the vertical petrous segment (series 402, image 249 and at least moderate superimposed stenosis in the cavernous and supraclinoid segments. Still, the right ICA terminus is patent. The right posterior communicating and right ophthalmic artery origins are normal. The left ICA siphon is patent also with moderate to severe atherosclerosis. There is moderate stenosis in the left cavernous segment and severe stenosis in the left supraclinoid ICA related to confluent calcified plaque. In fat, there might be a short segment occlusion of the supraclinoid ICA segment, with the left ICA terminus supplied from the left posterior communicating artery. Nevertheless, both carotid termini are patent. The left MCA and ACA origins are within normal limits. The right ACA A1 segment is diminutive or absent. The anterior communicating artery and bilateral ACA branches are within normal limits. The right MCA origin is mildly irregular and stenotic. The right M1 remains patent. The right MCA bifurcation is patent. There is up to moderate stenosis at the origin of the anterior right MCA M2 branch. Other visualized right MCA branches are within normal limits. The left MCA proximal M1 segment is mildly irregular and stenotic. There is moderate irregularity and stenosis at the left MCA bifurcation. The left M2 branches remain patent, and are within normal limits. Venous sinuses: Negative. Anatomic variants: Dominant left ACA A1 segment. Delayed phase: Left PCA infarct remain subtle by CT. No abnormal enhancement  identified. Review of the MIP images confirms the above findings IMPRESSION: 1. Left PCA occlusion in the distal P2 segment, corresponding to the acute left PCA infarct. 2. Right Vertebral Artery occlusion (short segment reconstitution from muscular branches in the distal V2 segment). 3. 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 4. Right PCA P1 segment occlusion and poor reconstitution of the right PCA branches from the right posterior communicating artery which is highly atherosclerotic and stenotic. 5. Radiographic string sign stenosis of the Right ICA siphon in the distal petrous segment. At least moderate tandem stenoses from dense calcified plaque in the right cavernous and supraclinoid ICA segments, but the right ICA terminus remains patent. 6. High-grade stenosis versus short segment occlusion of the Left ICA supraclinoid segment. The left ICA terminus  is patent but might be supplied from the left posterior communicating artery. 7. Mild to moderate bilateral MCA irregularity and stenosis, including moderate stenosis at the left MCA bifurcation. 8. Left greater than right carotid bifurcation atherosclerosis. Up to 60% left ICA bulb stenosis. 9. Bilateral pleural effusions with pulmonary ground-glass opacity and reactive appearing superior mediastinal lymphadenopathy. This constellation might reflect congestive heart failure with pulmonary edema. 10. Stable CT appearance of the brain. Electronically Signed: By: Genevie Ann M.D. On: 09/19/2016 13:44   Mr Brain Wo Contrast  Result Date: 09/19/2016 CLINICAL DATA:  75 y/o M; confusion that started with awakening this morning and a few days of left-sided arm tremors. EXAM: MRI HEAD WITHOUT CONTRAST TECHNIQUE: Multiplanar, multiecho pulse sequences of the brain and surrounding structures were obtained without intravenous contrast. COMPARISON:  09/19/2016 CT head.  05/15/2013 MRI  brain. FINDINGS: Brain: Diffusion restriction within the left posterior cerebral artery distribution involving the posterior hippocampus and occipital lobe. Background of moderate chronic microvascular ischemic changes and parenchymal volume loss. 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 No abnormal susceptibility hypointensity to indicate intracranial hemorrhage. Vascular: Irregularity of proximal flow voids is probably related intracranial atherosclerosis. Skull and upper cervical spine: Partially visualized susceptibility artifact from anterior cervical discectomy and fusion of the upper cervical spine. Otherwise normal bone marrow signal. Sinuses/Orbits: No abnormal signal of paranasal sinuses. Right mastoid effusion. No abnormal signal of left mastoid. Orbits are unremarkable. Other: None. IMPRESSION: 1. Acute infarct within the left posterior cerebral artery distribution. No hemorrhage identified. 2. Background of moderate chronic microvascular ischemic changes and parenchymal volume loss. 3. 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 These results were called by telephone at the time of interpretation on 09/19/2016 at 2:21 am to Dr. Carmin Muskrat , who verbally acknowledged these results. Electronically Signed   By: Kristine Garbe M.D.   On: 09/19/2016 02:22   Scheduled Meds: . aspirin  300 mg Rectal Daily   Or  . aspirin  325 mg Oral Daily  . baclofen  10 mg Oral Daily  . clopidogrel  75 mg Oral Daily  . enoxaparin (LOVENOX) injection  40 mg Subcutaneous Daily  . ezetimibe  10 mg Oral Daily  . gabapentin  300 mg Oral TID  . insulin aspart  0-5 Units Subcutaneous QHS  . insulin aspart  0-9 Units Subcutaneous TID WC  . insulin NPH Human  17 Units Subcutaneous BID AC & HS   Continuous Infusions:    LOS: 1 day   Kerney Elbe, DO [facility] Pager  [phone]  If 7PM-7AM, please contact night-coverage [email or web address] Password [facility] 09/19/2016, 6:19 PM

## 2016-09-19 NOTE — Progress Notes (Signed)
VASCULAR LAB PRELIMINARY  PRELIMINARY  PRELIMINARY  PRELIMINARY  Bilateral lower extremity venous duplex completed.    Preliminary report:  There is no DVT or SVT noted in the visualized veins of the bilateral lower extremities.   Deshae Dickison, RVT 09/19/2016, 4:59 PM
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
## 2016-09-19 NOTE — Progress Notes (Signed)
STROKE TEAM PROGRESS NOTE   HISTORY OF PRESENT ILLNESS (per record) Anthony Holder is a 75 y.o. male with extensive medical history who presents with confusion that started on awakening this morning. He also had left-sided arm tremors that started a few days ago. Today, he was not speaking much and what he did say was confused. His wife therefore brought him into the emergency room where an ED resident states that he was having very little verbal output at that time. His wife thinks that he is improving some, and at this time he is able to answer questions and states "there is nothing wrong with my speech."   LKW: Unclear, likely a few days ago for left-sided arm tremor tpa given?: no, outside of window   SUBJECTIVE (INTERVAL HISTORY) His wife is at the bedside.  Overall he feels his condition is stable. He still has right upper quadrantanopia, but orientated x 3 now. Wife denies any right foot wound infection, fever, but stated that pt may have staring spells last night. WBC 14.3 today. Pt denies any heart palpitation or racing heart.    OBJECTIVE Temp:  [97.5 F (36.4 C)-98 F (36.7 C)] 97.5 F (36.4 C) (10/01 0400) Pulse Rate:  [83-107] 106 (10/01 0500) Cardiac Rhythm: Sinus tachycardia (10/01 0407) Resp:  [14-26] 14 (10/01 0500) BP: (97-138)/(57-93) 126/76 (10/01 0500) SpO2:  [91 %-100 %] 98 % (10/01 0500) Weight:  [82.9 kg (182 lb 12.8 oz)-84.8 kg (187 lb)] 82.9 kg (182 lb 12.8 oz) (10/01 0455)  CBC:  Recent Labs Lab 09/18/16 2114  WBC 14.6*  NEUTROABS 10.1*  HGB 11.0*  HCT 35.1*  MCV 88.6  PLT 99991111    Basic Metabolic Panel:  Recent Labs Lab 09/18/16 2113  NA 135  K 4.9  CL 101  CO2 21*  GLUCOSE 280*  BUN 24*  CREATININE 1.42*  CALCIUM 10.0    Lipid Panel:    Component Value Date/Time   CHOL 153 09/19/2016 0425   TRIG 150 (H) 09/19/2016 0425   HDL 20 (L) 09/19/2016 0425   CHOLHDL 7.7 09/19/2016 0425   VLDL 30 09/19/2016 0425   LDLCALC 103 (H)  09/19/2016 0425   HgbA1c:  Lab Results  Component Value Date   HGBA1C 8.0 (H) 06/03/2016   Urine Drug Screen: No results found for: LABOPIA, COCAINSCRNUR, LABBENZ, AMPHETMU, THCU, LABBARB    IMAGING I have personally reviewed the radiological images below and agree with the radiology interpretations.  Dg Chest 2 View 09/18/2016 Small left pleural effusion with basilar atelectasis.   Ct Head Wo Contrast 09/19/2016 No acute intracranial abnormalities. Chronic atrophy and small vessel ischemic changes.   Mr Brain Wo Contrast 09/19/2016 1. Acute infarct within the left posterior cerebral artery distribution. No hemorrhage identified.  2. Background of moderate chronic microvascular ischemic changes and parenchymal volume loss.  3. 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   CTA head and neck pending  LE venous doppler pending  EEG pending  TTE pending  UDS pending   PHYSICAL EXAM  Temp:  [97.5 F (36.4 C)-98.2 F (36.8 C)] 98.2 F (36.8 C) (10/01 0800) Pulse Rate:  [83-107] 100 (10/01 0800) Resp:  [14-26] 16 (10/01 0800) BP: (97-138)/(57-93) 119/83 (10/01 0800) SpO2:  [91 %-100 %] 100 % (10/01 0800) Weight:  [182 lb 12.8 oz (82.9 kg)-187 lb (84.8 kg)] 182 lb 12.8 oz (82.9 kg) (10/01 0455)  General - Well nourished, well developed, in no apparent distress.  Ophthalmologic -  Fundi not visualized due to noncooperation.  Cardiovascular - Regular rate and rhythm.  Mental Status -  Level of arousal and orientation to time, place, and person were intact. Language including expression, naming, repetition, comprehension was assessed and found intact, paucity of speech with mild dysarthria due to lack of teeth.  Cranial Nerves II - XII - II - right upper quadrantanopia. III, IV, VI - Extraocular movements intact. V - Facial sensation intact bilaterally. VII - Facial movement intact bilaterally. VIII - Hearing &  vestibular intact bilaterally. X - Palate elevates symmetrically. XI - Chin turning & shoulder shrug intact bilaterally. XII - Tongue protrusion intact.  Motor Strength - The patient's strength was normal in all extremities except LLE BKA and R toes amputation with dressing and pronator drift was absent.  Bulk was normal and fasciculations were absent.   Motor Tone - Muscle tone was assessed at the neck and appendages and was normal.  Reflexes - The patient's reflexes were 1+ in all extremities and he had no pathological reflexes.  Sensory - Light touch, temperature/pinprick were assessed and were symmetrical.    Coordination - The patient had normal movements in the hands with no ataxia or dysmetria.  Tremor was absent.  Gait and Station - not tested due to amputations    ASSESSMENT/PLAN [name] is a 75 y.o. male with history of DM, HTN, severe AS, CAD s/p CABG, HLD, left leg BKA, s/p right CEA, CHF with EF 30%, right thalamic stroke in 2013 presenting with confusion and decreased speech output. He did not receive IV t-PA due to out of window.   Stroke:  left PCA infarct, likely due to large vessel atherosclerosis given pt extensive hx of vasculopathy. However, cardioembolic can not be ruled out at this time.   Resultant  Right upper quadrantanopia  MRI  Left PCA infarct, old right thalamic infarct  CTA head and neck - pending  2D Echo  pending  EEG pending  LE venous doppler pending  LDL 103  HgbA1c pending  UDS pending  lovenox for VTE prophylaxis  Diet heart healthy/carb modified Room service appropriate? Yes; Fluid consistency: Thin  No antithrombotic prior to admission, now on aspirin 325 mg daily. Recommend DAPT for 3 motnhs and then plavix alone.   Patient counseled to be compliant with his antithrombotic medications  Ongoing aggressive stroke risk factor management  Therapy recommendations:  pending  Disposition:   pending  Hypertension  Stable  Permissive hypertension (OK if < 180/105) but gradually normalize in 5-7 days  Long-term BP goal normotensive  Hyperlipidemia  Home meds:  zetia, resumed in hospital  LDL 103, goal < 70  On Zetia, continue on discharge  Pt has statin allergy  Diabetes  HgbA1c pending, goal < 7.0  Unontrolled  High CBG  On home meds - NPH  SSI  Close outpt follow up for better DM control  Other Stroke Risk Factors  Advanced age  Remote Cigarette smoking hx  Hx stroke/TIA - 2013 right thalamic infarct  Coronary artery disease s/p CABG  Severe AS, pending intervention  S/p right CEA  Left BKA  right toes amputation with unhealing wound   CHF with low EF  Other Active Problems  Elevated troponin  Leukocytosis  Elevated Cre  Hospital day # 1  Rosalin Hawking, MD PhD Stroke Neurology 09/19/2016 9:07 AM    To contact Stroke Continuity provider, please refer to [email or web address] After hours, contact General Neurology

## 2016-09-20 ENCOUNTER — Other Ambulatory Visit (HOSPITAL_COMMUNITY): Payer: Medicare Other

## 2016-09-20 ENCOUNTER — Inpatient Hospital Stay (HOSPITAL_COMMUNITY): Payer: Medicare Other

## 2016-09-20 DIAGNOSIS — R269 Unspecified abnormalities of gait and mobility: Secondary | ICD-10-CM

## 2016-09-20 DIAGNOSIS — E1122 Type 2 diabetes mellitus with diabetic chronic kidney disease: Secondary | ICD-10-CM

## 2016-09-20 DIAGNOSIS — Z794 Long term (current) use of insulin: Secondary | ICD-10-CM

## 2016-09-20 DIAGNOSIS — I63532 Cerebral infarction due to unspecified occlusion or stenosis of left posterior cerebral artery: Principal | ICD-10-CM

## 2016-09-20 DIAGNOSIS — N182 Chronic kidney disease, stage 2 (mild): Secondary | ICD-10-CM

## 2016-09-20 DIAGNOSIS — I6350 Cerebral infarction due to unspecified occlusion or stenosis of unspecified cerebral artery: Secondary | ICD-10-CM

## 2016-09-20 DIAGNOSIS — R4182 Altered mental status, unspecified: Secondary | ICD-10-CM

## 2016-09-20 LAB — COMPREHENSIVE METABOLIC PANEL
ALBUMIN: 3.2 g/dL — AB (ref 3.5–5.0)
ALK PHOS: 40 U/L (ref 38–126)
ALT: 9 U/L — AB (ref 17–63)
ANION GAP: 10 (ref 5–15)
AST: 13 U/L — AB (ref 15–41)
BUN: 22 mg/dL — AB (ref 6–20)
CALCIUM: 9.4 mg/dL (ref 8.9–10.3)
CO2: 23 mmol/L (ref 22–32)
Chloride: 103 mmol/L (ref 101–111)
Creatinine, Ser: 1.32 mg/dL — ABNORMAL HIGH (ref 0.61–1.24)
GFR calc Af Amer: 60 mL/min — ABNORMAL LOW (ref >60)
GFR calc non Af Amer: 51 mL/min — ABNORMAL LOW (ref >60)
GLUCOSE: 125 mg/dL — AB (ref 65–99)
Potassium: 3.9 mmol/L (ref 3.5–5.1)
SODIUM: 136 mmol/L (ref 135–145)
Total Bilirubin: 0.7 mg/dL (ref 0.3–1.2)
Total Protein: 7.5 g/dL (ref 6.5–8.1)

## 2016-09-20 LAB — CBC WITH DIFFERENTIAL/PLATELET
BASOS ABS: 0.1 10*3/uL (ref 0.0–0.1)
BASOS PCT: 1 %
EOS ABS: 0.3 10*3/uL (ref 0.0–0.7)
Eosinophils Relative: 3 %
HEMATOCRIT: 32.2 % — AB (ref 39.0–52.0)
HEMOGLOBIN: 10 g/dL — AB (ref 13.0–17.0)
Lymphocytes Relative: 23 %
Lymphs Abs: 2.4 10*3/uL (ref 0.7–4.0)
MCH: 27.4 pg (ref 26.0–34.0)
MCHC: 31.1 g/dL (ref 30.0–36.0)
MCV: 88.2 fL (ref 78.0–100.0)
Monocytes Absolute: 0.9 10*3/uL (ref 0.1–1.0)
Monocytes Relative: 9 %
NEUTROS ABS: 7 10*3/uL (ref 1.7–7.7)
NEUTROS PCT: 66 %
Platelets: 251 10*3/uL (ref 150–400)
RBC: 3.65 MIL/uL — AB (ref 4.22–5.81)
RDW: 16.1 % — ABNORMAL HIGH (ref 11.5–15.5)
WBC: 10.7 10*3/uL — AB (ref 4.0–10.5)

## 2016-09-20 LAB — HEMOGLOBIN A1C
Hgb A1c MFr Bld: 6.9 % — ABNORMAL HIGH (ref 4.8–5.6)
Mean Plasma Glucose: 151 mg/dL

## 2016-09-20 LAB — GLUCOSE, CAPILLARY
GLUCOSE-CAPILLARY: 218 mg/dL — AB (ref 65–99)
Glucose-Capillary: 80 mg/dL (ref 65–99)
Glucose-Capillary: 209 mg/dL — ABNORMAL HIGH (ref 65–99)
Glucose-Capillary: 238 mg/dL — ABNORMAL HIGH (ref 65–99)

## 2016-09-20 LAB — URINE CULTURE: Culture: NO GROWTH

## 2016-09-20 LAB — RAPID URINE DRUG SCREEN, HOSP PERFORMED
AMPHETAMINES: NOT DETECTED
BENZODIAZEPINES: POSITIVE — AB
Barbiturates: NOT DETECTED
COCAINE: NOT DETECTED
OPIATES: NOT DETECTED
Tetrahydrocannabinol: NOT DETECTED

## 2016-09-20 LAB — TROPONIN I
TROPONIN I: 1.53 ng/mL — AB (ref <0.03)
Troponin I: 1.32 ng/mL (ref <0.03)
Troponin I: 1.05 ng/mL (ref <0.03)

## 2016-09-20 NOTE — Progress Notes (Signed)
This RN entered pt's room to perform I&O cath as ordered by Lamar Blinks, NP.  Pt stated he needed to void.  Pt voided 575 mL of clear, amber, odorless urine.  I&O cath not performed at this time.  Flomax given per order.  Will continue to monitor.  Jodell Cipro

## 2016-09-20 NOTE — Progress Notes (Signed)
PROGRESS NOTE    Anthony Holder  [id number] DOB: 26-Feb-1941 DOA: 09/18/2016 PCP: [name], MD   Brief Narrative:  Anthony Holder is a 75 y.o. male with a history of CAD s/p CABG x5, severe AS with pulmonary HTN, COPD, DM, PVD followed by [name], CKD stage III, HTN, HLD,  carotid artery disease s/p R CEA, Ischemic Cardiomyopathy and Systolic CHF with an EF of 25%, Left BKA and Transmetatarsal amputation and other comorbidities who presented with confusion x 1 day. He was worked up and admitted and found to have an Acute Infarct within the Left PCA. Neurology was consulted and Stroke Team Following. Cardiology Consulted to make recommendations about elevated Troponin's. He was somnolent but arousable today and did not want to participate in his examination.   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 Acute Infarct within the Left Posterior Cerebral Artery  2. Elevated Troponin's, improving 3. Lactic Acidosis, resolved 4. Hypertension 5. Hyperlipidemia 6. Ischemic Cardiomyopathy with EF of 25% on last Echo 7. Severe Aortic Stenosis 8. Chronic Kidney Disease Stage 3 9. Peripherial Vascular Disease 10. 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 Acute Infarct within the Left Posterior Cerebral Artery  Neurology Stroke Team Consulted and Appreciate Recommendations. Discussed Case with Dr. Erlinda Hong. Continue to follow Neurology Recommendations  -CT Head showed No acute intracranial abnormalities. Chronic  atrophy and small vessel ischemic changes.  -MRI of Head Showed 1. Acute infarct within the left posterior cerebral artery distribution. No hemorrhage identified. 2. Background of moderate chronic microvascular ischemic changes and parenchymal volume loss. 3. 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  -CT Angio of Head and Neck showed: Left PCA occlusion in the distal P2 segment, corresponding to the acute left PCA infarct. 2. Right Vertebral Artery occlusion (short segment reconstitution from muscular branches in the distal V2 segment). 3. 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 4. Right PCA P1 segment occlusion and poor reconstitution of the right PCA branches from the right posterior communicating artery which is highly atherosclerotic and stenotic. 5. Radiographic string sign stenosis of the Right ICA siphon in the distal petrous segment. At least moderate tandem stenoses from dense calcified plaque in the right cavernous and supraclinoid ICA segments, but the right ICA terminus remains patent. 6. High-grade stenosis versus short segment occlusion of the Left ICA supraclinoid segment. The left ICA terminus is patent but might be supplied from the left posterior communicating artery. 7. Mild to moderate bilateral MCA irregularity and stenosis, including moderate stenosis at the left MCA bifurcation. 8. Left greater than right carotid bifurcation atherosclerosis. Up to 60% left ICA bulb stenosis. 9. Bilateral pleural effusions with pulmonary ground-glass opacity and reactive appearing superior mediastinal lymphadenopathy. This constellation might reflect congestive heart failure with pulmonary edema. 10. Stable CT appearance of the brain.  -Transthoracic Echocardiogram ordered and pending   -EEG shows This awake and asleep EEG is  abnormal due to mild diffuse  slowing of the waking background. Clinical Correlation of the above findings indicates diffuse cerebral dysfunction that is non-specific in etiology and can be seen with hypoxic/ischemic injury, toxic/metabolic encephalopathies, neurodegenerative disorders, or medication effect.  The absence of epileptiform discharges does not rule out a clinical diagnosis of epilepsy.   -UDS Positive for Benzodiazepines   -LE Dopplers Preliminary Report showed There is no DVT or SVT noted in the visualized veins of the bilateral lower extremities -Lipid Panel showed Cholesterol of 153, TG of 150, HDL of 20, LDL of 103, and VLDL of 30 -Dual Antiplatelet Therapy with ASA 325 mg po Daily and Clopidogrel 75 mg po Daily per Neuro Reccomendations.  -HbA1c Pending -PT/OT/SLP Evaluate and treat - PT recommends Home Health Pt; Likely D/C Home with Home Health in AM -Neurochecks per protocol -Continue with Bedside Telemetry  -Allow for Permissive HTN -Neuro Follow up in 6 weeks with Dr. Krista Blue at [facility].   2. Elevated Troponins -EKG showed some mild ST Depression on my Read -Troponins Trended to 1.73 -> 1.72 -> 1.62 -> 1.44 -> 1.53 -> 1.32 -> 1.05. Will stop Checking -Appreciate Cardiology Consult Dr. Rosezella [place] Evaluation and Recommendations. - Cardiology Thinks it is unlikely ACS. Troponin elevation could be 2/2 to Acute CVA and CKD  3. Lactic Acidosis, Resolved -Continue to Hold Metformin -D/C'd Abx of Ceftriaxone and Azithromycin -LA went from 2.28 -> 1.9 -Procalcitonin 0.11; No WBC and patients afebrile unlikely Infectious and Sepsis  4. Hypertension -Allow for Permissive HTN (<180/105) -Hold Carvedilol and Furosemide -No ACE/ARB because of CKD, Restart slowly  5. Hyperlipidemia -Lipid Panel showed Cholesterol of 153, TG of 150, HDL of 20, LDL of 103, and VLDL of 30 -C/w Ezetimbe 10 mg po Daily as patient is Statin Intolerant -Referral made to [facility] by Cardiology as Patient may be candidate for PCSK9 Candidate.  6. Ischemic Cardiomyopathy with EF of 25%, currently not in exacerbation -Repeat ECHO -Hold Carvediolol and Furosemide -No ACE/ARB 2/2 to CKD  7. Severe Aortic Stenosis -Considered and Evaluated for TAVR after her recovers from his illness and recent R Transmetatarsal Surgery.   8. CKD Stage 3 -BUN/Cr on Repeat Labwork was 22/1.27 respectively -> 22/1.32 today -Repeat BMP in AM  9. Peripheral Vascular Disease -C/w Plavix 75 mg and Zetia 10 mg   10. Insulin Dependent Diabetes Mellitus Type 2 complicated with Peripheral Neuropathy -CBG was 289 -C/w Novolog SSI AC and HS along with Insulin NPH 17 Units sq BID -Hypoglycemia Protocol in Place -Metformin held   DVT prophylaxis: Lovenox Code Status: FULL Family Communication: Discussed Plan of Care with Patient's wife  Disposition Plan: Patient will likely be D/C'd to [facility] in AM after Echo resulted.   Consultants:   -Neurology Stroke Team Dr. Erlinda Hong  -Cardiology Dr. Percival Spanish  Procedures: ECHO Pending Antimicrobials: D/C'd Ceftriaxone and Azithromycin  Subjective: Patient was seen and examined at bedside this AM and he was sleeping. He was very somnolent but arousable with Sternal Rub. Would not cooperate in questioning as all he wanted to do was sleep.   Objective: Vitals:   09/20/16 0032 09/20/16 0207 09/20/16 0406 09/20/16 0451  BP: 136/72 117/74 96/68 103/68  Pulse: (!) 101   (!) 103  Resp: 18   16  Temp: 98.4 F (36.9 C)   98.8 F (37.1 C)  TempSrc: Oral   Oral  SpO2: 97% 99% 98% 93%  Weight:      Height:        Intake/Output Summary (  Last 24 hours) at 09/20/16 0718 Last data filed at 09/20/16 0020  Gross per 24 hour  Intake              840 ml  Output              575 ml  Net              265 ml   Filed Weights   09/18/16 2107 09/19/16 0455  Weight: 84.8 kg (187 lb) 82.9 kg (182 lb 12.8 oz)    Examination: Physical  Exam:  Constitutional:  Elderly sick appearing Caucasian male who looks older than stated age.  Eyes:  lids and conjunctivae normal, sclerae anicteric. EOMI  ENMT: External Ears, Nose appear normal. No dentition as patient didn't bring teeth.  Neck: Appears normal, supple, no cervical masses, normal ROM, no appreciable thyromegaly no JVD appreciated.  Respiratory: Clear to auscultation bilaterally, no wheezing, rales, rhonchi or crackles. Normal respiratory effort and patient is not tachypenic. No accessory muscle use.  Cardiovascular: RRR, Loud 4/6 Systolic Murmur best heard at RSB. No rubs / gallops. S1 and S2 auscultated. No extremity edema.  Abdomen: Soft, non-tender, non-distended. No masses palpated. No appreciable hepatosplenomegaly. Bowel sounds positive.  GU: Deferred. Musculoskeletal: No clubbing / cyanosis of digits/nails. Patient with Left BKA and with Right Transmetatarsal Amputation with wound debrided with good granulation tissue. No contractures. Normal strength and muscle tone in Upper Extremities.  Skin: No rashes, lesions, ulcers. No induration; Warm and dry.  Neurologic: Limited Neuro exam as patietn was not cooperative. Sensation intact in Ue.  Extemities. Romberg sign cerebellar reflexes not assessed.  Psychiatric: Normal judgment and insight. Not alert or awake as he would not cooperate and just wanted to sleep. Normal mood and Flat affect.   Data Reviewed: I have reviewed the following.  CBC:  Recent Labs Lab 09/18/16 2114 09/19/16 0847 09/20/16 0044  WBC 14.6* 10.7* 10.7*  NEUTROABS 10.1* 7.2 7.0  HGB 11.0* 10.1* 10.0*  HCT 35.1* 31.5* 32.2*  MCV 88.6 87.5 88.2  PLT 302 298 [id number]   Basic Metabolic Panel:  Recent Labs Lab 09/18/16 2113 09/19/16 0847 09/20/16 0044  NA 135 135 136  K 4.9 4.2 3.9  CL 101 101 103  CO2 21* 24 23  GLUCOSE 280* 289* 125*  BUN 24* 22* 22*  CREATININE 1.42* 1.27* 1.32*  CALCIUM 10.0 9.6 9.4  MG  --  1.8  --   PHOS  --  3.0   --    GFR: Estimated Creatinine Clearance: 50.7 mL/min (by C-G formula based on SCr of 1.32 mg/dL (H)). Liver Function Tests:  Recent Labs Lab 09/18/16 2113 09/19/16 0847 09/20/16 0044  AST 18 16 13*  ALT 9* 8* 9*  ALKPHOS 46 41 40  BILITOT 0.8 0.6 0.7  PROT 8.1 7.8 7.5  ALBUMIN 3.7 3.4* 3.2*   No results for input(s): LIPASE, AMYLASE in the last 168 hours. No results for input(s): AMMONIA in the last 168 hours. Coagulation Profile: No results for input(s): INR, PROTIME in the last 168 hours. Cardiac Enzymes:  Recent Labs Lab 09/19/16 0425 09/19/16 0847 09/19/16 1319 09/19/16 1913 09/20/16 0044  TROPONINI 1.73* 1.72* 1.62* 1.44* 1.53*   BNP (last 3 results) No results for input(s): PROBNP in the last 8760 hours. HbA1C: No results for input(s): HGBA1C in the last 72 hours. CBG:  Recent Labs Lab 09/18/16 2122 09/19/16 0740 09/19/16 1258 09/19/16 1622 09/19/16 2047  GLUCAP 282* 266* 192* 197* 208*  Lipid Profile:  Recent Labs  09/19/16 0425  CHOL 153  HDL 20*  LDLCALC 103*  TRIG 150*  CHOLHDL 7.7   Thyroid Function Tests: No results for input(s): TSH, T4TOTAL, FREET4, T3FREE, THYROIDAB in the last 72 hours. Anemia Panel: No results for input(s): VITAMINB12, FOLATE, FERRITIN, TIBC, IRON, RETICCTPCT in the last 72 hours. Sepsis Labs:  Recent Labs Lab 09/18/16 2133 09/19/16 0053 09/19/16 0425  PROCALCITON  --   --  0.11  LATICACIDVEN 3.55* 2.28* 1.9    No results found for this or any previous visit (from the past 240 hour(s)).   Radiology Studies: Ct Angio Head W Or Wo Contrast  Addendum Date: 09/19/2016   ADDENDUM REPORT: 09/19/2016 14:12 ADDENDUM: Study discussed by telephone with Dr. Rosalin Hawking on 09/19/2016 at 1350 hours. Electronically Signed   By: Genevie Ann M.D.   On: 09/19/2016 14:12   Result Date: 09/19/2016 CLINICAL DATA:  75 year old male with left PCA infarct discovered on MRI for acute confusion. Initial encounter. EXAM: CT  ANGIOGRAPHY HEAD AND NECK TECHNIQUE: Multidetector CT imaging of the head and neck was performed using the standard protocol during bolus administration of intravenous contrast. Multiplanar CT image reconstructions and MIPs were obtained to evaluate the vascular anatomy. Carotid stenosis measurements (when applicable) are obtained utilizing NASCET criteria, using the distal internal carotid diameter as the denominator. CONTRAST:  50 mL Isovue 370 COMPARISON:  Brain MRI 0133 hours today, and earlier. FINDINGS: CTA NECK Skeleton: Previous cervical C3-C4 absent dentition. No acute osseous abnormality identified. Visualized paranasal sinuses and mastoids are stable and well pneumatized. And C4-C5 ACDF. Previous median sternotomy. Upper chest: Small layering bilateral pleural effusions, that on the left also tracks into the major fissure. Septal thickening and mosaic attenuation in the bilateral lung parenchyma. Right peritracheal lymphadenopathy with individual nodes measuring 16 mm. Anterior carina nodes measure up to 13 mm. Sequelae of CABG. Other neck: Partially calcified 16 mm left thyroid hypodense nodule which meets consensus criteria for ultrasound follow-up. Negative larynx, pharynx, parapharyngeal spaces, retropharyngeal space, sublingual space, submandibular glands and parotid glands. No cervical lymphadenopathy. Aortic arch: Calcified aortic atherosclerosis. Three vessel arch configuration. Soft arch atherosclerosis as well. Right carotid system: Right CCA soft and calcified plaque resulting in stenosis of less than 50 % with respect to the distal vessel. Soft plaque continues in the posterior right CCA. There is soft plaque with less than 50% stenosis just proximal to the right carotid bifurcation. At the bifurcation there is soft and calcified plaque. No right ICA origin or bulb stenosis. There is soft and calcified plaque at the right skullbase resulting in less than 50 % stenosis with respect to the  distal vessel. Left carotid system: No left CCA origin stenosis. Soft plaque intermittently in the left CCA proximal to the bifurcation. Soft and calcified plaque at the bifurcation resulting in stenosis in the bulb up to 55-60 % with respect to the distal vessel. See series 401, image 87. There is soft plaque in the left ICA at the skullbase resulting in. Less than 50 % stenosis with respect to the distal vessel Vertebral arteries: No proximal right subclavian artery stenosis despite soft and calcified plaque. 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 There is a calcification at the origin. The right vertebral artery is occluded up to the C2-C3 level where it appears to be mildly reconstituted from muscular branches, but is highly diminutive and terminates outside the skull. Soft plaque in the proximal left subclavian artery with less than 50 %  stenosis with respect to the distal vessel. Soft and calcified plaque at the left vertebral artery origin with severe stenosis (series 405, image 121). Tortuous left V1 segment. Minimal plaque in the left V2 segment. Mild left V3 segment plaque without significant stenosis. CTA HEAD Posterior circulation: Patent distal left vertebral artery which appears somewhat dominant has no stenosis to the basilar. There is mild left V4 segment plaque. Both a ICAs appear dominant. The distal right vertebral artery is occluded. There is moderate basilar artery irregularity and mild stenosis of the basilar just distal to the AICA origins. The basilar remains patent. The SCA and left PCA origins are normal. The left posterior communicating artery is present. The left PCA is occluded in the distal P2 segment with no reconstituted flow. The right P1 is occluded but the right posterior communicating artery is patent although moderately to severely irregular and stenotic. Still, the right PCA branches are patent but attenuated. Anterior circulation: The right ICA siphon is patent but  there is radiographic string sign stenosis of the siphon in the vertical petrous segment (series 402, image 249 and at least moderate superimposed stenosis in the cavernous and supraclinoid segments. Still, the right ICA terminus is patent. The right posterior communicating and right ophthalmic artery origins are normal. The left ICA siphon is patent also with moderate to severe atherosclerosis. There is moderate stenosis in the left cavernous segment and severe stenosis in the left supraclinoid ICA related to confluent calcified plaque. In fat, there might be a short segment occlusion of the supraclinoid ICA segment, with the left ICA terminus supplied from the left posterior communicating artery. Nevertheless, both carotid termini are patent. The left MCA and ACA origins are within normal limits. The right ACA A1 segment is diminutive or absent. The anterior communicating artery and bilateral ACA branches are within normal limits. The right MCA origin is mildly irregular and stenotic. The right M1 remains patent. The right MCA bifurcation is patent. There is up to moderate stenosis at the origin of the anterior right MCA M2 branch. Other visualized right MCA branches are within normal limits. The left MCA proximal M1 segment is mildly irregular and stenotic. There is moderate irregularity and stenosis at the left MCA bifurcation. The left M2 branches remain patent, and are within normal limits. Venous sinuses: Negative. Anatomic variants: Dominant left ACA A1 segment. Delayed phase: Left PCA infarct remain subtle by CT. No abnormal enhancement identified. Review of the MIP images confirms the above findings IMPRESSION: 1. Left PCA occlusion in the distal P2 segment, corresponding to the acute left PCA infarct. 2. Right Vertebral Artery occlusion (short segment reconstitution from muscular branches in the distal V2 segment). 3. Superimposed Severe atherosclerotic stenosis at the origin of the Left Vertebral Artery  which is the lone supply to the Basilar. 4. Right PCA P1 segment occlusion and poor reconstitution of the right PCA branches from the right posterior communicating artery which is highly atherosclerotic and stenotic. 5. Radiographic string sign stenosis of the Right ICA siphon in the distal petrous segment. At least moderate tandem stenoses from dense calcified plaque in the right cavernous and supraclinoid ICA segments, but the right ICA terminus remains patent. 6. High-grade stenosis versus short segment occlusion of the Left ICA supraclinoid segment. The left ICA terminus is patent but might be supplied from the left posterior communicating artery. 7. Mild to moderate bilateral MCA irregularity and stenosis, including moderate stenosis at the left MCA bifurcation. 8. Left greater than right carotid bifurcation atherosclerosis.  Up to 60% left ICA bulb stenosis. 9. Bilateral pleural effusions with pulmonary ground-glass opacity and reactive appearing superior mediastinal lymphadenopathy. This constellation might reflect congestive heart failure with pulmonary edema. 10. Stable CT appearance of the brain. Electronically Signed: By: Genevie Ann M.D. On: 09/19/2016 13:44   Dg Chest 2 View  Result Date: 09/18/2016 CLINICAL DATA:  New left arm tremor. Pain in the left shoulder. Confusion today. Nausea and vomiting. Abnormal EKG. EXAM: CHEST  2 VIEW COMPARISON:  06/05/2016 FINDINGS: Postoperative changes in the mediastinum. Heart size and pulmonary vascularity are normal for technique. Blunting of the left costophrenic angle suggesting small pleural effusion. Mild atelectasis in the left face. Right lung is clear. No pneumothorax calcification of the aorta. Degenerative changes in the spine and shoulders. Postoperative change in the cervical spine. IMPRESSION: Small left pleural effusion with basilar atelectasis. Electronically Signed   By: Lucienne Capers M.D.   On: 09/18/2016 22:35   Ct Head Wo Contrast  Result  Date: 09/19/2016 CLINICAL DATA:  Altered mental status. New left arm tremor. Left shoulder pain. Confusion today. EXAM: CT HEAD WITHOUT CONTRAST TECHNIQUE: Contiguous axial images were obtained from the base of the skull through the vertex without intravenous contrast. COMPARISON:  CT head 05/14/2013.  MRI brain 05/15/2013. FINDINGS: Brain: Diffuse cerebral atrophy. Ventricular dilatation consistent with central atrophy. Low-attenuation changes in the deep white matter consistent small vessel ischemia. No mass effect or midline shift. No abnormal extra-axial fluid collections. Gray-white matter junctions are distinct. Basal cisterns are not effaced. No evidence of acute intracranial hemorrhage. Vascular: Atherosclerotic vascular calcifications are present. Skull: Normal. Negative for fracture or focal lesion. Sinuses/Orbits: Visualized paranasal sinuses are clear. Partial opacification of some of the right mastoid air cells. Left mastoids are clear. Other: No significant changes since previous study. IMPRESSION: No acute intracranial abnormalities. Chronic atrophy and small vessel ischemic changes. Electronically Signed   By: Lucienne Capers M.D.   On: 09/19/2016 00:23   Ct Angio Neck W Or Wo Contrast  Addendum Date: 09/19/2016   ADDENDUM REPORT: 09/19/2016 14:12 ADDENDUM: Study discussed by telephone with Dr. Rosalin Hawking on 09/19/2016 at 1350 hours. Electronically Signed   By: Genevie Ann M.D.   On: 09/19/2016 14:12   Result Date: 09/19/2016 CLINICAL DATA:  75 year old male with left PCA infarct discovered on MRI for acute confusion. Initial encounter. EXAM: CT ANGIOGRAPHY HEAD AND NECK TECHNIQUE: Multidetector CT imaging of the head and neck was performed using the standard protocol during bolus administration of intravenous contrast. Multiplanar CT image reconstructions and MIPs were obtained to evaluate the vascular anatomy. Carotid stenosis measurements (when applicable) are obtained utilizing NASCET criteria,  using the distal internal carotid diameter as the denominator. CONTRAST:  50 mL Isovue 370 COMPARISON:  Brain MRI 0133 hours today, and earlier. FINDINGS: CTA NECK Skeleton: Previous cervical C3-C4 absent dentition. No acute osseous abnormality identified. Visualized paranasal sinuses and mastoids are stable and well pneumatized. And C4-C5 ACDF. Previous median sternotomy. Upper chest: Small layering bilateral pleural effusions, that on the left also tracks into the major fissure. Septal thickening and mosaic attenuation in the bilateral lung parenchyma. Right peritracheal lymphadenopathy with individual nodes measuring 16 mm. Anterior carina nodes measure up to 13 mm. Sequelae of CABG. Other neck: Partially calcified 16 mm left thyroid hypodense nodule which meets consensus criteria for ultrasound follow-up. Negative larynx, pharynx, parapharyngeal spaces, retropharyngeal space, sublingual space, submandibular glands and parotid glands. No cervical lymphadenopathy. Aortic arch: Calcified aortic atherosclerosis. Three vessel arch configuration. Soft  arch atherosclerosis as well. Right carotid system: Right CCA soft and calcified plaque resulting in stenosis of less than 50 % with respect to the distal vessel. Soft plaque continues in the posterior right CCA. There is soft plaque with less than 50% stenosis just proximal to the right carotid bifurcation. At the bifurcation there is soft and calcified plaque. No right ICA origin or bulb stenosis. There is soft and calcified plaque at the right skullbase resulting in less than 50 % stenosis with respect to the distal vessel. Left carotid system: No left CCA origin stenosis. Soft plaque intermittently in the left CCA proximal to the bifurcation. Soft and calcified plaque at the bifurcation resulting in stenosis in the bulb up to 55-60 % with respect to the distal vessel. See series 401, image 87. There is soft plaque in the left ICA at the skullbase resulting in. Less  than 50 % stenosis with respect to the distal vessel Vertebral arteries: No proximal right subclavian artery stenosis despite soft and calcified plaque. 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 There is a calcification at the origin. The right vertebral artery is occluded up to the C2-C3 level where it appears to be mildly reconstituted from muscular branches, but is highly diminutive and terminates outside the skull. Soft plaque in the proximal left subclavian artery with less than 50 % stenosis with respect to the distal vessel. Soft and calcified plaque at the left vertebral artery origin with severe stenosis (series 405, image 121). Tortuous left V1 segment. Minimal plaque in the left V2 segment. Mild left V3 segment plaque without significant stenosis. CTA HEAD Posterior circulation: Patent distal left vertebral artery which appears somewhat dominant has no stenosis to the basilar. There is mild left V4 segment plaque. Both a ICAs appear dominant. The distal right vertebral artery is occluded. There is moderate basilar artery irregularity and mild stenosis of the basilar just distal to the AICA origins. The basilar remains patent. The SCA and left PCA origins are normal. The left posterior communicating artery is present. The left PCA is occluded in the distal P2 segment with no reconstituted flow. The right P1 is occluded but the right posterior communicating artery is patent although moderately to severely irregular and stenotic. Still, the right PCA branches are patent but attenuated. Anterior circulation: The right ICA siphon is patent but there is radiographic string sign stenosis of the siphon in the vertical petrous segment (series 402, image 249 and at least moderate superimposed stenosis in the cavernous and supraclinoid segments. Still, the right ICA terminus is patent. The right posterior communicating and right ophthalmic artery origins are normal. The left ICA siphon is patent also with  moderate to severe atherosclerosis. There is moderate stenosis in the left cavernous segment and severe stenosis in the left supraclinoid ICA related to confluent calcified plaque. In fat, there might be a short segment occlusion of the supraclinoid ICA segment, with the left ICA terminus supplied from the left posterior communicating artery. Nevertheless, both carotid termini are patent. The left MCA and ACA origins are within normal limits. The right ACA A1 segment is diminutive or absent. The anterior communicating artery and bilateral ACA branches are within normal limits. The right MCA origin is mildly irregular and stenotic. The right M1 remains patent. The right MCA bifurcation is patent. There is up to moderate stenosis at the origin of the anterior right MCA M2 branch. Other visualized right MCA branches are within normal limits. The left MCA proximal M1 segment is  mildly irregular and stenotic. There is moderate irregularity and stenosis at the left MCA bifurcation. The left M2 branches remain patent, and are within normal limits. Venous sinuses: Negative. Anatomic variants: Dominant left ACA A1 segment. Delayed phase: Left PCA infarct remain subtle by CT. No abnormal enhancement identified. Review of the MIP images confirms the above findings IMPRESSION: 1. Left PCA occlusion in the distal P2 segment, corresponding to the acute left PCA infarct. 2. Right Vertebral Artery occlusion (short segment reconstitution from muscular branches in the distal V2 segment). 3. 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 4. Right PCA P1 segment occlusion and poor reconstitution of the right PCA branches from the right posterior communicating artery which is highly atherosclerotic and stenotic. 5. Radiographic string sign stenosis of the Right ICA siphon in the distal petrous segment. At least moderate tandem stenoses from dense calcified  plaque in the right cavernous and supraclinoid ICA segments, but the right ICA terminus remains patent. 6. High-grade stenosis versus short segment occlusion of the Left ICA supraclinoid segment. The left ICA terminus is patent but might be supplied from the left posterior communicating artery. 7. Mild to moderate bilateral MCA irregularity and stenosis, including moderate stenosis at the left MCA bifurcation. 8. Left greater than right carotid bifurcation atherosclerosis. Up to 60% left ICA bulb stenosis. 9. Bilateral pleural effusions with pulmonary ground-glass opacity and reactive appearing superior mediastinal lymphadenopathy. This constellation might reflect congestive heart failure with pulmonary edema. 10. Stable CT appearance of the brain. Electronically Signed: By: Genevie Ann M.D. On: 09/19/2016 13:44   Mr Brain Wo Contrast  Result Date: 09/19/2016 CLINICAL DATA:  75 y/o M; confusion that started with awakening this morning and a few days of left-sided arm tremors. EXAM: MRI HEAD WITHOUT CONTRAST TECHNIQUE: Multiplanar, multiecho pulse sequences of the brain and surrounding structures were obtained without intravenous contrast. COMPARISON:  09/19/2016 CT head.  05/15/2013 MRI brain. FINDINGS: Brain: Diffusion restriction within the left posterior cerebral artery distribution involving the posterior hippocampus and occipital lobe. Background of moderate chronic microvascular ischemic changes and parenchymal volume loss. 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 No abnormal susceptibility hypointensity to indicate intracranial hemorrhage. Vascular: Irregularity of proximal flow voids is probably related intracranial atherosclerosis. Skull and upper cervical spine: Partially visualized susceptibility artifact from anterior cervical discectomy and fusion of the upper cervical spine. Otherwise normal bone marrow signal. Sinuses/Orbits: No abnormal signal of  paranasal sinuses. Right mastoid effusion. No abnormal signal of left mastoid. Orbits are unremarkable. Other: None. IMPRESSION: 1. Acute infarct within the left posterior cerebral artery distribution. No hemorrhage identified. 2. Background of moderate chronic microvascular ischemic changes and parenchymal volume loss. 3. 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 These results were called by telephone at the time of interpretation on 09/19/2016 at 2:21 am to Dr. Carmin Muskrat , who verbally acknowledged these results. Electronically Signed   By: Kristine Garbe M.D.   On: 09/19/2016 02:22   ECHOCARDIOGRAM: PENDING  Scheduled Meds: . aspirin  300 mg Rectal Daily   Or  . aspirin  325 mg Oral Daily  . baclofen  10 mg Oral Daily  . clopidogrel  75 mg Oral Daily  . enoxaparin (LOVENOX) injection  40 mg Subcutaneous Daily  . ezetimibe  10 mg Oral Daily  . gabapentin  300 mg Oral TID  . insulin aspart  0-5 Units Subcutaneous QHS  . insulin aspart  0-9 Units Subcutaneous TID WC  . insulin NPH Human  17 Units Subcutaneous BID AC & HS  . tamsulosin  0.4 mg Oral Daily   Continuous Infusions:    LOS: 1 day   Kerney Elbe, DO [facility] Pager [phone]  If 7PM-7AM, please contact night-coverage [email or web address] Password [email or web address] 09/20/2016, 7:18 AM

## 2016-09-20 NOTE — Progress Notes (Signed)
EEG completed; results pending.    

## 2016-09-20 NOTE — Progress Notes (Signed)
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 Percival Spanish  75 y.o. male w/ PMH of CAD (s/p CABG x5 in 2010, cath 05/2016 with 4/5 patent grafts), severe AS with pulmonary HTN, COPD, DM, PVD (s/p left BKA - followed by [name]), Stage 3 CKD, HTN, HLD, and carotid artery disease (s/p R CEA) admitted on 09/18/2016 for acute CVA. Cards consulted for elevated troponin.   Subjective   Patient sleeping, wife at the bedside. No recent chest discomfort, shortness of breath, or palpitations.   Inpatient Medications    . aspirin  300 mg Rectal Daily   Or  . aspirin  325 mg Oral Daily  . baclofen  10 mg Oral Daily  . clopidogrel  75 mg Oral Daily  . enoxaparin (LOVENOX) injection  40 mg Subcutaneous Daily  . ezetimibe  10 mg Oral Daily  . gabapentin  300 mg Oral TID  . insulin aspart  0-5 Units Subcutaneous QHS  . insulin aspart  0-9 Units Subcutaneous TID WC  . insulin NPH Human  17 Units Subcutaneous BID AC & HS  . tamsulosin  0.4 mg Oral Daily    Vital Signs    Vitals:   09/20/16 0207 09/20/16 0406 09/20/16 0451 09/20/16 0746  BP: 117/74 96/68 103/68   Pulse:   (!) 103 (!) 106  Resp:   16 15  Temp:   98.8 F (37.1 C) 98.4 F (36.9 C)  TempSrc:   Oral Oral  SpO2: 99% 98% 93% 94%  Weight:      Height:        Intake/Output Summary (Last 24 hours) at 09/20/16 0916 Last data filed at 09/20/16 0020  Gross per 24 hour  Intake              480 ml  Output              575 ml  Net              -95 ml   Filed Weights    09/18/16 2107 09/19/16 0455  Weight: 187 lb (84.8 kg) 182 lb 12.8 oz (82.9 kg)    Physical Exam    General: Elderly Caucasian  male appearing in no acute distress. Head: Normocephalic, atraumatic.  Neck: Supple without bruits, JVD not elevated. Lungs:  Resp regular and unlabored, CTA without wheezing or rales. Heart: RRR, S1, S2, no S3, S4, 3/6 SEM at RUSB; no rub. Abdomen: Soft, non-tender, non-distended with normoactive bowel sounds. No hepatomegaly. No rebound/guarding. No obvious abdominal masses. Extremities: No clubbing, cyanosis, or edema. Left BKA. Right lower extremity digits removed with healing-wound noted. Neuro: Alert and oriented X 3. Moves all extremities spontaneously. Psych: Normal affect.  Labs    CBC  Recent Labs  09/19/16 0847 09/20/16 0044  WBC 10.7* 10.7*  NEUTROABS 7.2 7.0  HGB 10.1* 10.0*  HCT 31.5* 32.2*  MCV 87.5 88.2  PLT 298 [id number]   Basic Metabolic Panel  Recent Labs  09/19/16 0847 09/20/16 0044  NA  135 136  K 4.2 3.9  CL 101 103  CO2 24 23  GLUCOSE 289* 125*  BUN 22* 22*  CREATININE 1.27* 1.32*  CALCIUM 9.6 9.4  MG 1.8  --   PHOS 3.0  --    Liver Function Tests  Recent Labs  09/19/16 0847 09/20/16 0044  AST 16 13*  ALT 8* 9*  ALKPHOS 41 40  BILITOT 0.6 0.7  PROT 7.8 7.5  ALBUMIN 3.4* 3.2*   No results for input(s): LIPASE, AMYLASE in the last 72 hours. Cardiac Enzymes  Recent Labs  09/19/16 1913 09/20/16 0044 09/20/16 0802  TROPONINI 1.44* 1.53* 1.32*   BNP Invalid input(s): POCBNP D-Dimer No results for input(s): DDIMER in the last 72 hours. Hemoglobin A1C  Recent Labs  09/19/16 0425  HGBA1C 6.9*   Fasting Lipid Panel  Recent Labs  09/19/16 0425  CHOL 153  HDL 20*  LDLCALC 103*  TRIG 150*  CHOLHDL 7.7   Thyroid Function Tests No results for input(s): TSH, T4TOTAL, T3FREE, THYROIDAB in the last 72 hours.  Invalid input(s): FREET3  Telemetry    NSR, HR in 80's - 90's.   ECG    No new  tracings.    Cardiac Studies and Radiology    Ct Angio Head W Or Wo Contrast  Addendum Date: 09/19/2016   ADDENDUM REPORT: 09/19/2016 14:12 ADDENDUM: Study discussed by telephone with Dr. Rosalin Hawking on 09/19/2016 at 1350 hours. Electronically Signed   By: Genevie Ann M.D.   On: 09/19/2016 14:12   Result Date: 09/19/2016 CLINICAL DATA:  75 year old male with left PCA infarct discovered on MRI for acute confusion. Initial encounter. EXAM: CT ANGIOGRAPHY HEAD AND NECK TECHNIQUE: Multidetector CT imaging of the head and neck was performed using the standard protocol during bolus administration of intravenous contrast. Multiplanar CT image reconstructions and MIPs were obtained to evaluate the vascular anatomy. Carotid stenosis measurements (when applicable) are obtained utilizing NASCET criteria, using the distal internal carotid diameter as the denominator. CONTRAST:  50 mL Isovue 370 COMPARISON:  Brain MRI 0133 hours today, and earlier. FINDINGS: CTA NECK Skeleton: Previous cervical C3-C4 absent dentition. No acute osseous abnormality identified. Visualized paranasal sinuses and mastoids are stable and well pneumatized. And C4-C5 ACDF. Previous median sternotomy. Upper chest: Small layering bilateral pleural effusions, that on the left also tracks into the major fissure. Septal thickening and mosaic attenuation in the bilateral lung parenchyma. Right peritracheal lymphadenopathy with individual nodes measuring 16 mm. Anterior carina nodes measure up to 13 mm. Sequelae of CABG. Other neck: Partially calcified 16 mm left thyroid hypodense nodule which meets consensus criteria for ultrasound follow-up. Negative larynx, pharynx, parapharyngeal spaces, retropharyngeal space, sublingual space, submandibular glands and parotid glands. No cervical lymphadenopathy. Aortic arch: Calcified aortic atherosclerosis. Three vessel arch configuration. Soft arch atherosclerosis as well. Right carotid system: Right CCA soft and  calcified plaque resulting in stenosis of less than 50 % with respect to the distal vessel. Soft plaque continues in the posterior right CCA. There is soft plaque with less than 50% stenosis just proximal to the right carotid bifurcation. At the bifurcation there is soft and calcified plaque. No right ICA origin or bulb stenosis. There is soft and calcified plaque at the right skullbase resulting in less than 50 % stenosis with respect to the distal vessel. Left carotid system: No left CCA origin stenosis. Soft plaque intermittently in the left CCA proximal to the bifurcation. Soft and calcified plaque at the bifurcation resulting in stenosis in the  bulb up to 55-60 % with respect to the distal vessel. See series 401, image 87. There is soft plaque in the left ICA at the skullbase resulting in. Less than 50 % stenosis with respect to the distal vessel Vertebral arteries: No proximal right subclavian artery stenosis despite soft and calcified plaque. 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 There is a calcification at the origin. The right vertebral artery is occluded up to the C2-C3 level where it appears to be mildly reconstituted from muscular branches, but is highly diminutive and terminates outside the skull. Soft plaque in the proximal left subclavian artery with less than 50 % stenosis with respect to the distal vessel. Soft and calcified plaque at the left vertebral artery origin with severe stenosis (series 405, image 121). Tortuous left V1 segment. Minimal plaque in the left V2 segment. Mild left V3 segment plaque without significant stenosis. CTA HEAD Posterior circulation: Patent distal left vertebral artery which appears somewhat dominant has no stenosis to the basilar. There is mild left V4 segment plaque. Both a ICAs appear dominant. The distal right vertebral artery is occluded. There is moderate basilar artery irregularity and mild stenosis of the basilar just distal to the AICA origins. The  basilar remains patent. The SCA and left PCA origins are normal. The left posterior communicating artery is present. The left PCA is occluded in the distal P2 segment with no reconstituted flow. The right P1 is occluded but the right posterior communicating artery is patent although moderately to severely irregular and stenotic. Still, the right PCA branches are patent but attenuated. Anterior circulation: The right ICA siphon is patent but there is radiographic string sign stenosis of the siphon in the vertical petrous segment (series 402, image 249 and at least moderate superimposed stenosis in the cavernous and supraclinoid segments. Still, the right ICA terminus is patent. The right posterior communicating and right ophthalmic artery origins are normal. The left ICA siphon is patent also with moderate to severe atherosclerosis. There is moderate stenosis in the left cavernous segment and severe stenosis in the left supraclinoid ICA related to confluent calcified plaque. In fat, there might be a short segment occlusion of the supraclinoid ICA segment, with the left ICA terminus supplied from the left posterior communicating artery. Nevertheless, both carotid termini are patent. The left MCA and ACA origins are within normal limits. The right ACA A1 segment is diminutive or absent. The anterior communicating artery and bilateral ACA branches are within normal limits. The right MCA origin is mildly irregular and stenotic. The right M1 remains patent. The right MCA bifurcation is patent. There is up to moderate stenosis at the origin of the anterior right MCA M2 branch. Other visualized right MCA branches are within normal limits. The left MCA proximal M1 segment is mildly irregular and stenotic. There is moderate irregularity and stenosis at the left MCA bifurcation. The left M2 branches remain patent, and are within normal limits. Venous sinuses: Negative. Anatomic variants: Dominant left ACA A1 segment. Delayed  phase: Left PCA infarct remain subtle by CT. No abnormal enhancement identified. Review of the MIP images confirms the above findings IMPRESSION: 1. Left PCA occlusion in the distal P2 segment, corresponding to the acute left PCA infarct. 2. Right Vertebral Artery occlusion (short segment reconstitution from muscular branches in the distal V2 segment). 3. 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 4. Right PCA P1 segment occlusion and poor reconstitution  of the right PCA branches from the right posterior communicating artery which is highly atherosclerotic and stenotic. 5. Radiographic string sign stenosis of the Right ICA siphon in the distal petrous segment. At least moderate tandem stenoses from dense calcified plaque in the right cavernous and supraclinoid ICA segments, but the right ICA terminus remains patent. 6. High-grade stenosis versus short segment occlusion of the Left ICA supraclinoid segment. The left ICA terminus is patent but might be supplied from the left posterior communicating artery. 7. Mild to moderate bilateral MCA irregularity and stenosis, including moderate stenosis at the left MCA bifurcation. 8. Left greater than right carotid bifurcation atherosclerosis. Up to 60% left ICA bulb stenosis. 9. Bilateral pleural effusions with pulmonary ground-glass opacity and reactive appearing superior mediastinal lymphadenopathy. This constellation might reflect congestive heart failure with pulmonary edema. 10. Stable CT appearance of the brain. Electronically Signed: By: Genevie Ann M.D. On: 09/19/2016 13:44   Dg Chest 2 View  Result Date: 09/18/2016 CLINICAL DATA:  New left arm tremor. Pain in the left shoulder. Confusion today. Nausea and vomiting. Abnormal EKG. EXAM: CHEST  2 VIEW COMPARISON:  06/05/2016 FINDINGS: Postoperative changes in the mediastinum. Heart size and pulmonary vascularity are normal for  technique. Blunting of the left costophrenic angle suggesting small pleural effusion. Mild atelectasis in the left face. Right lung is clear. No pneumothorax calcification of the aorta. Degenerative changes in the spine and shoulders. Postoperative change in the cervical spine. IMPRESSION: Small left pleural effusion with basilar atelectasis. Electronically Signed   By: Lucienne Capers M.D.   On: 09/18/2016 22:35   Ct Head Wo Contrast  Result Date: 09/19/2016 CLINICAL DATA:  Altered mental status. New left arm tremor. Left shoulder pain. Confusion today. EXAM: CT HEAD WITHOUT CONTRAST TECHNIQUE: Contiguous axial images were obtained from the base of the skull through the vertex without intravenous contrast. COMPARISON:  CT head 05/14/2013.  MRI brain 05/15/2013. FINDINGS: Brain: Diffuse cerebral atrophy. Ventricular dilatation consistent with central atrophy. Low-attenuation changes in the deep white matter consistent small vessel ischemia. No mass effect or midline shift. No abnormal extra-axial fluid collections. Gray-white matter junctions are distinct. Basal cisterns are not effaced. No evidence of acute intracranial hemorrhage. Vascular: Atherosclerotic vascular calcifications are present. Skull: Normal. Negative for fracture or focal lesion. Sinuses/Orbits: Visualized paranasal sinuses are clear. Partial opacification of some of the right mastoid air cells. Left mastoids are clear. Other: No significant changes since previous study. IMPRESSION: No acute intracranial abnormalities. Chronic atrophy and small vessel ischemic changes. Electronically Signed   By: Lucienne Capers M.D.   On: 09/19/2016 00:23   Ct Angio Neck W Or Wo Contrast  Addendum Date: 09/19/2016   ADDENDUM REPORT: 09/19/2016 14:12 ADDENDUM: Study discussed by telephone with Dr. Rosalin Hawking on 09/19/2016 at 1350 hours. Electronically Signed   By: Genevie Ann M.D.   On: 09/19/2016 14:12   Result Date: 09/19/2016 CLINICAL DATA:  75 year old  male with left PCA infarct discovered on MRI for acute confusion. Initial encounter. EXAM: CT ANGIOGRAPHY HEAD AND NECK TECHNIQUE: Multidetector CT imaging of the head and neck was performed using the standard protocol during bolus administration of intravenous contrast. Multiplanar CT image reconstructions and MIPs were obtained to evaluate the vascular anatomy. Carotid stenosis measurements (when applicable) are obtained utilizing NASCET criteria, using the distal internal carotid diameter as the denominator. CONTRAST:  50 mL Isovue 370 COMPARISON:  Brain MRI 0133 hours today, and earlier. FINDINGS: CTA NECK Skeleton: Previous cervical C3-C4 absent dentition. No acute  osseous abnormality identified. Visualized paranasal sinuses and mastoids are stable and well pneumatized. And C4-C5 ACDF. Previous median sternotomy. Upper chest: Small layering bilateral pleural effusions, that on the left also tracks into the major fissure. Septal thickening and mosaic attenuation in the bilateral lung parenchyma. Right peritracheal lymphadenopathy with individual nodes measuring 16 mm. Anterior carina nodes measure up to 13 mm. Sequelae of CABG. Other neck: Partially calcified 16 mm left thyroid hypodense nodule which meets consensus criteria for ultrasound follow-up. Negative larynx, pharynx, parapharyngeal spaces, retropharyngeal space, sublingual space, submandibular glands and parotid glands. No cervical lymphadenopathy. Aortic arch: Calcified aortic atherosclerosis. Three vessel arch configuration. Soft arch atherosclerosis as well. Right carotid system: Right CCA soft and calcified plaque resulting in stenosis of less than 50 % with respect to the distal vessel. Soft plaque continues in the posterior right CCA. There is soft plaque with less than 50% stenosis just proximal to the right carotid bifurcation. At the bifurcation there is soft and calcified plaque. No right ICA origin or bulb stenosis. There is soft and  calcified plaque at the right skullbase resulting in less than 50 % stenosis with respect to the distal vessel. Left carotid system: No left CCA origin stenosis. Soft plaque intermittently in the left CCA proximal to the bifurcation. Soft and calcified plaque at the bifurcation resulting in stenosis in the bulb up to 55-60 % with respect to the distal vessel. See series 401, image 87. There is soft plaque in the left ICA at the skullbase resulting in. Less than 50 % stenosis with respect to the distal vessel Vertebral arteries: No proximal right subclavian artery stenosis despite soft and calcified plaque. 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 There is a calcification at the origin. The right vertebral artery is occluded up to the C2-C3 level where it appears to be mildly reconstituted from muscular branches, but is highly diminutive and terminates outside the skull. Soft plaque in the proximal left subclavian artery with less than 50 % stenosis with respect to the distal vessel. Soft and calcified plaque at the left vertebral artery origin with severe stenosis (series 405, image 121). Tortuous left V1 segment. Minimal plaque in the left V2 segment. Mild left V3 segment plaque without significant stenosis. CTA HEAD Posterior circulation: Patent distal left vertebral artery which appears somewhat dominant has no stenosis to the basilar. There is mild left V4 segment plaque. Both a ICAs appear dominant. The distal right vertebral artery is occluded. There is moderate basilar artery irregularity and mild stenosis of the basilar just distal to the AICA origins. The basilar remains patent. The SCA and left PCA origins are normal. The left posterior communicating artery is present. The left PCA is occluded in the distal P2 segment with no reconstituted flow. The right P1 is occluded but the right posterior communicating artery is patent although moderately to severely irregular and stenotic. Still, the right PCA  branches are patent but attenuated. Anterior circulation: The right ICA siphon is patent but there is radiographic string sign stenosis of the siphon in the vertical petrous segment (series 402, image 249 and at least moderate superimposed stenosis in the cavernous and supraclinoid segments. Still, the right ICA terminus is patent. The right posterior communicating and right ophthalmic artery origins are normal. The left ICA siphon is patent also with moderate to severe atherosclerosis. There is moderate stenosis in the left cavernous segment and severe stenosis in the left supraclinoid ICA related to confluent calcified plaque. In fat, there might be  a short segment occlusion of the supraclinoid ICA segment, with the left ICA terminus supplied from the left posterior communicating artery. Nevertheless, both carotid termini are patent. The left MCA and ACA origins are within normal limits. The right ACA A1 segment is diminutive or absent. The anterior communicating artery and bilateral ACA branches are within normal limits. The right MCA origin is mildly irregular and stenotic. The right M1 remains patent. The right MCA bifurcation is patent. There is up to moderate stenosis at the origin of the anterior right MCA M2 branch. Other visualized right MCA branches are within normal limits. The left MCA proximal M1 segment is mildly irregular and stenotic. There is moderate irregularity and stenosis at the left MCA bifurcation. The left M2 branches remain patent, and are within normal limits. Venous sinuses: Negative. Anatomic variants: Dominant left ACA A1 segment. Delayed phase: Left PCA infarct remain subtle by CT. No abnormal enhancement identified. Review of the MIP images confirms the above findings IMPRESSION: 1. Left PCA occlusion in the distal P2 segment, corresponding to the acute left PCA infarct. 2. Right Vertebral Artery occlusion (short segment reconstitution from muscular branches in the distal V2  segment). 3. 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 4. Right PCA P1 segment occlusion and poor reconstitution of the right PCA branches from the right posterior communicating artery which is highly atherosclerotic and stenotic. 5. Radiographic string sign stenosis of the Right ICA siphon in the distal petrous segment. At least moderate tandem stenoses from dense calcified plaque in the right cavernous and supraclinoid ICA segments, but the right ICA terminus remains patent. 6. High-grade stenosis versus short segment occlusion of the Left ICA supraclinoid segment. The left ICA terminus is patent but might be supplied from the left posterior communicating artery. 7. Mild to moderate bilateral MCA irregularity and stenosis, including moderate stenosis at the left MCA bifurcation. 8. Left greater than right carotid bifurcation atherosclerosis. Up to 60% left ICA bulb stenosis. 9. Bilateral pleural effusions with pulmonary ground-glass opacity and reactive appearing superior mediastinal lymphadenopathy. This constellation might reflect congestive heart failure with pulmonary edema. 10. Stable CT appearance of the brain. Electronically Signed: By: Genevie Ann M.D. On: 09/19/2016 13:44   Mr Brain Wo Contrast  Result Date: 09/19/2016 CLINICAL DATA:  75 y/o M; confusion that started with awakening this morning and a few days of left-sided arm tremors. EXAM: MRI HEAD WITHOUT CONTRAST TECHNIQUE: Multiplanar, multiecho pulse sequences of the brain and surrounding structures were obtained without intravenous contrast. COMPARISON:  09/19/2016 CT head.  05/15/2013 MRI brain. FINDINGS: Brain: Diffusion restriction within the left posterior cerebral artery distribution involving the posterior hippocampus and occipital lobe. Background of moderate chronic microvascular ischemic changes and parenchymal volume loss. Chronic lacunar infarcts are  present within the right thalamus and left cerebellar hemisphere. No abnormal susceptibility hypointensity to indicate intracranial hemorrhage. Vascular: Irregularity of proximal flow voids is probably related intracranial atherosclerosis. Skull and upper cervical spine: Partially visualized susceptibility artifact from anterior cervical discectomy and fusion of the upper cervical spine. Otherwise normal bone marrow signal. Sinuses/Orbits: No abnormal signal of paranasal sinuses. Right mastoid effusion. No abnormal signal of left mastoid. Orbits are unremarkable. Other: None. IMPRESSION: 1. Acute infarct within the left posterior cerebral artery distribution. No hemorrhage identified. 2. Background of moderate chronic microvascular ischemic changes and parenchymal volume loss. 3. 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 These  results were called by telephone at the time of interpretation on 09/19/2016 at 2:21 am to Dr. Carmin Muskrat , who verbally acknowledged these results. Electronically Signed   By: Kristine Garbe M.D.   On: 09/19/2016 02:22    Echocardiogram: 02/2016 Study Conclusions  - Left ventricle: The cavity size was normal. Wall thickness was   normal. Systolic function was severely reduced. The estimated   ejection fraction was in the range of 25% to 30%. Akinesis of the   inferior myocardium. - Aortic valve: There was moderate to severe stenosis. Valve area   (VTI): 0.66 cm^2. Valve area (Vmax): 0.62 cm^2. Valve area   (Vmean): 0.58 cm^2. - Left atrium: The atrium was mildly dilated. - Pulmonary arteries: Systolic pressure was moderately increased.   PA peak pressure: 54 mm Hg (S).  Assessment & Plan    1. Elevated Troponin - cyclic troponin values have been 1.73, 1.72, 1.62, 1.44, 1.53, and 1.32. EKG shows ST depression in inferolateral leads, similar to previous tracings.  - thought to be secondary to demand  ischemia in the setting of acute CVA and Stage 3 CKD. - repeat echo is pending to assess LV function and wall motion. Would continue with conservative treatment in setting of acute illness and recent cath.  2. 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 Cath in 05/2016 showing patent LIMA-LAD, SVG-OM2-OM3, SVG-distal RCA and occluded SVG-RI. - EF 25-30% by echo in 02/2016 - not a candidate for ACE-I/ARB/ Arlyce Harman secondary to CKD. Coreg held secondary to hypotension. - continue ASA, Plavix, and Zetia.  3. Severe AS - cath in 05/2016 showed severe calcific aortic valve stenosis with a peak to peak gradient of 52 mm Hg, mean gradient 52.9 mm Hg, and a calculated aortic valve area of 0.67 cm.  - consider TAVR eval following recovery of acute CVA. - repeat echo is pending as above.  4. HLD - Lipid Panel shows total cholesterol 153, HDL 20, LDL 103. - intolerant to statin therapy. On Zetia. Referral to [facility]id Clinic for possible initiation of PCSK9 as an outpatient.   5. Stage 3 CKD - creatinine stable at 1.32 this AM. Continue to monitor.  6. IDDM - Hgb A1c 6.9 this admission. - per admitting team  7. Acute CVA - MRI showing acute infarct within the left posterior cerebral artery distribution. - Neurology following.  Arna Medici , PA-C 9:16 AM 09/20/2016 Pager: [phone]  Patient seen and examined. Agree with assessment and plan. Currently sleeping. No chest pain according to wife. Pt has a significant cardiomyopathy and severe AS. His last cath was by me on 06/07/16 which showed severe native CAD, and occluded vein graft to ramus, but patent LIMA to LAD, seq SVG to OM2-3, and SVG to RCA.  Troponins are c/w demand ischemia. BP is slightly improved; will try to re-institute coreg at 1.56 mg bid initally.  EEG with non-specific diffuse cerebral dysfunction. ? Future TAVR if significant neurologic recovery.   Troy Sine, MD,  [facility] 09/20/2016 12:38 PM

## 2016-09-20 NOTE — Progress Notes (Signed)
Inpatient Diabetes Program Recommendations  AACE/ADA: New Consensus Statement on Inpatient Glycemic Control (2015)  Target Ranges:  Prepandial:   less than 140 mg/dL      Peak postprandial:   less than 180 mg/dL (1-2 hours)      Critically ill patients:  140 - 180 mg/dL   Results for SHOGO, SPEARIN (MRN [id number]) as of 09/20/2016 11:18  Ref. Range 09/19/2016 07:40 09/19/2016 12:58 09/19/2016 16:22 09/19/2016 20:47  Glucose-Capillary Latest Ref Range: 65 - 99 mg/dL 266 (H) 192 (H) 197 (H) 208 (H)   Results for SINA, SLOANE (MRN [id number]) as of 09/20/2016 11:18  Ref. Range 09/20/2016 07:30 09/20/2016 11:15  Glucose-Capillary Latest Ref Range: 65 - 99 mg/dL 80 218 (H)    Admit with: AMS  History: DM, CHF, CKD  Home DM Meds: NPH Insulin 17 units bid       Metformin 1000 mg bid  Current Insulin Orders: NPH Insulin 17 units bid      Novolog Sensitive Correction Scale/ SSI (0-9 units) TID AC + HS       -Note NPH Insulin started yesterday AM.    -Current A1c= 6.9%.  Well controlled CBGs at home.  -Fasting glucose improved this AM, however, patient still having issues with elevated postprandial glucose levels.    MD- Please consider starting Novolog Meal Coverage:  Novolog 3 units tid with meals (hold if pt eats <50% of meal)       --Will follow patient during hospitalization--  Wyn Quaker RN, MSN, CDE Diabetes Coordinator Inpatient Glycemic Control Team Team Pager: [phone] (8a-5p)

## 2016-09-20 NOTE — Consult Note (Signed)
[facility] Nurse wound consult note Reason for Consult:chronic noon-healing full thickness wound following right trans metatarsal amputation by Dr. Darene Lamer. Early several weeks ago.  A subsequent pressure injury on the posterior right heelm (Stage 3) is also noted. Wound type:Surgical, pressure Pressure Ulcer POA: Yes Measurement: Lateral incision measures 5cm x 1.5cm x 2cm.  Wound bed is 40% yellow slough and 60% red, moist tissue.  [name] has instructed the patient's wife in the application of daily saline dressings. We will continue those here, increasing the frequency to twice daily and including pressure redistribution to the heel in our POC. Wound bed:As described above Drainage (amount, consistency, odor) Scant serous Periwound:Intact, dry with evidence of recent healing at the surgical site, with dried callous at the heel ulcer periphery Dressing procedure/placement/frequency: I will continue with Dr. Luther Parody POC and use twice daily saline dressings to fill the defect; I have added a pressure redistribution heel boot to mitigate risk factors for worsening ulceration at the posterior heel site. [facility] nursing team will not follow, but will remain available to this patient, the nursing and medical teams.  Please re-consult if needed. Thanks, Maudie Flakes, MSN, RN, [place], Arther Abbott  Pager# [phone]

## 2016-09-20 NOTE — Progress Notes (Signed)
STROKE TEAM PROGRESS NOTE   HISTORY OF PRESENT ILLNESS (per record) Anthony Holder is a 75 y.o. male with extensive medical history who presents with confusion that started on awakening this morning. He also had left-sided arm tremors that started a few days ago. Today, he was not speaking much and what he did say was confused. His wife therefore brought him into the emergency room where an ED resident states that he was having very little verbal output at that time. His wife thinks that he is improving some, and at this time he is able to answer questions and states "there is nothing wrong with my speech."   LKW: Unclear, likely a few days ago for left-sided arm tremor tpa given?: no, outside of window   SUBJECTIVE (INTERVAL HISTORY) His wife is at the bedside.  Overall he feels his condition is stable. He still has right upper quadrantanopia, but orientated x 3 now. Wife denies any right foot wound infection, fever, but stated that pt may have staring spells last night. WBC 14.3 today. Pt denies any heart palpitation or racing heart.    OBJECTIVE Temp:  [97.8 F (36.6 C)-98.8 F (37.1 C)] 98.2 F (36.8 C) (10/02 1214) Pulse Rate:  [96-106] 102 (10/02 1214) Cardiac Rhythm: Sinus tachycardia (10/02 0800) Resp:  [15-18] 16 (10/02 1214) BP: (96-136)/(50-74) 103/68 (10/02 0451) SpO2:  [93 %-99 %] 96 % (10/02 1214)  CBC:   Recent Labs Lab 09/19/16 0847 09/20/16 0044  WBC 10.7* 10.7*  NEUTROABS 7.2 7.0  HGB 10.1* 10.0*  HCT 31.5* 32.2*  MCV 87.5 88.2  PLT 298 [id number]    Basic Metabolic Panel:   Recent Labs Lab 09/19/16 0847 09/20/16 0044  NA 135 136  K 4.2 3.9  CL 101 103  CO2 24 23  GLUCOSE 289* 125*  BUN 22* 22*  CREATININE 1.27* 1.32*  CALCIUM 9.6 9.4  MG 1.8  --   PHOS 3.0  --     Lipid Panel:     Component Value Date/Time   CHOL 153 09/19/2016 0425   TRIG 150 (H) 09/19/2016 0425   HDL 20 (L) 09/19/2016 0425   CHOLHDL 7.7 09/19/2016 0425   VLDL 30  09/19/2016 0425   LDLCALC 103 (H) 09/19/2016 0425   HgbA1c:  Lab Results  Component Value Date   HGBA1C 6.9 (H) 09/19/2016   Urine Drug Screen:     Component Value Date/Time   LABOPIA NONE DETECTED 09/18/2016 0015   COCAINSCRNUR NONE DETECTED 09/18/2016 0015   LABBENZ POSITIVE (A) 09/18/2016 0015   AMPHETMU NONE DETECTED 09/18/2016 0015   THCU NONE DETECTED 09/18/2016 0015   LABBARB NONE DETECTED 09/18/2016 0015      IMAGING I have personally reviewed the radiological images below and agree with the radiology interpretations.  Dg Chest 2 View 09/18/2016 Small left pleural effusion with basilar atelectasis.   Ct Head Wo Contrast 09/19/2016 No acute intracranial abnormalities. Chronic atrophy and small vessel ischemic changes.   Mr Brain Wo Contrast 09/19/2016 1. Acute infarct within the left posterior cerebral artery distribution. No hemorrhage identified.  2. Background of moderate chronic microvascular ischemic changes and parenchymal volume loss.  3. 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   CTA head and neck  1. Left PCA occlusion in the distal P2 segment, corresponding to the acute left PCA infarct. 2. Right Vertebral Artery occlusion (short segment reconstitution from muscular branches in the distal V2 segment). 3. Superimposed Severe atherosclerotic stenosis at  the origin of the Left Vertebral Artery which is the lone supply to the Basilar. 4. Right PCA P1 segment occlusion and poor reconstitution of the right PCA branches from the right posterior communicating artery which is highly atherosclerotic and stenotic. 5. Radiographic string sign stenosis of the Right ICA siphon in the distal petrous segment. At least moderate tandem stenoses from dense calcified plaque in the right cavernous and supraclinoid ICA segments, but the right ICA terminus remains patent. 6. High-grade stenosis versus short segment  occlusion of the Left ICA supraclinoid segment. The left ICA terminus is patent but might be supplied from the left posterior communicating artery. 7. Mild to moderate bilateral MCA irregularity and stenosis, including moderate stenosis at the left MCA bifurcation. 8. Left greater than right carotid bifurcation atherosclerosis. Up to 60% left ICA bulb stenosis. 9. Bilateral pleural effusions with pulmonary ground-glass opacity and reactive appearing superior mediastinal lymphadenopathy. This constellation might reflect congestive heart failure with pulmonary edema. 10. Stable CT appearance of the brain.  LE venous doppler  No obvious evidence of deep vein thrombosis involving the visualized veins of the bilateral lower extremities.  EEG  This awake and asleep EEG is abnormal due to mild diffuse slowing of the waking background. Clinical Correlation of the above findings indicates diffuse cerebral dysfunction that is non-specific in etiology and can be seen with hypoxic/ischemic injury, toxic/metabolic encephalopathies, neurodegenerative disorders, or medication effect.  The absence of epileptiform discharges does not rule out a clinical diagnosis of epilepsy.  Clinical correlation is advised.  TTE pending   PHYSICAL EXAM  Temp:  [97.8 F (36.6 C)-98.8 F (37.1 C)] 98.2 F (36.8 C) (10/02 1214) Pulse Rate:  [96-106] 102 (10/02 1214) Resp:  [15-18] 16 (10/02 1214) BP: (96-136)/(50-74) 103/68 (10/02 0451) SpO2:  [93 %-99 %] 96 % (10/02 1214)  General - Well nourished, well developed, in no apparent distress.  Ophthalmologic - Fundi not visualized due to noncooperation.  Cardiovascular - Regular rate and rhythm.  Mental Status -  Level of arousal and orientation to time, place, and person were intact. Language including expression, naming, repetition, comprehension was assessed and found intact, paucity of speech with mild dysarthria due to lack of teeth.  Cranial Nerves II -  XII - II - right upper quadrantanopia. III, IV, VI - Extraocular movements intact. V - Facial sensation intact bilaterally. VII - Facial movement intact bilaterally. VIII - Hearing & vestibular intact bilaterally. X - Palate elevates symmetrically. XI - Chin turning & shoulder shrug intact bilaterally. XII - Tongue protrusion intact.  Motor Strength - The patient's strength was normal in all extremities except LLE BKA and R toes amputation with dressing and pronator drift was absent.  Bulk was normal and fasciculations were absent.   Motor Tone - Muscle tone was assessed at the neck and appendages and was normal.  Reflexes - The patient's reflexes were 1+ in all extremities and he had no pathological reflexes.  Sensory - Light touch, temperature/pinprick were assessed and were symmetrical.    Coordination - The patient had normal movements in the hands with no ataxia or dysmetria.  Tremor was absent.  Gait and Station - not tested due to amputations    ASSESSMENT/PLAN Mr. Genaro Melhado is a 75 y.o. male with history of DM, HTN, severe AS, CAD s/p CABG, HLD, left leg BKA, s/p right CEA, CHF with EF 30%, right thalamic stroke in 2013 presenting with confusion and decreased speech output. He did not receive IV t-PA due to  out of window.   Stroke:  left PCA infarct, likely due to large vessel atherosclerosis given pt extensive hx of vasculopathy. However, cardioembolic can not be ruled out at this time.   Resultant  Right upper quadrantanopia  MRI  Left PCA infarct, old right thalamic infarct  CTA head and neck - left P2 occlusion. Multifocal severe stenosis at intracranial vessels, diffuse athero.  2D Echo  pending  EEG diffuse slow but no seizure like activity  LE venous doppler negative for DVT  LDL 103  HgbA1c 6.9  lovenox for VTE prophylaxis Diet heart healthy/carb modified Room service appropriate? Yes; Fluid consistency: Thin  No antithrombotic prior to admission,  now on aspirin 325 mg daily. Recommend DAPT for 3 motnhs and then plavix alone.   Patient counseled to be compliant with his antithrombotic medications  Ongoing aggressive stroke risk factor management  Therapy recommendations:  pending  Disposition:  pending  Hypertension  Stable  Permissive hypertension (OK if < 180/105) but gradually normalize in 5-7 days  Long-term BP goal normotensive  Hyperlipidemia  Home meds:  zetia, resumed in hospital  LDL 103, goal < 70  On Zetia, continue on discharge  Pt has statin allergy  Diabetes  HgbA1c 6.9, goal < 7.0  Controlled  fluctuating CBG  On home meds - NPH  SSI  Close outpt follow up for better DM control  Other Stroke Risk Factors  Advanced age  Remote Cigarette smoking hx  Hx stroke/TIA - 2013 right thalamic infarct  Coronary artery disease s/p CABG  Severe AS, pending intervention  S/p right CEA  Left BKA  right toes amputation with unhealing wound   CHF with low EF  Other Active Problems  Elevated troponin  Leukocytosis  Elevated Cre  Hospital day # 1  Neurology will sign off. Please call with questions. Pt will follow up with Dr. Krista Blue at [facility] in about 6 weeks. Thanks for the consult.   Rosalin Hawking, MD PhD Stroke Neurology 09/20/2016 3:29 PM    To contact Stroke Continuity provider, please refer to [email or web address] After hours, contact General Neurology

## 2016-09-20 NOTE — Procedures (Signed)
ELECTROENCEPHALOGRAM REPORT  Date of Study: 09/20/2016  Patient's Name: Anthony Holder MRN: [id number] Date of Birth: [birth year]  Referring Provider: Dr. Rosalin Hawking  Clinical History: This is a 75 year old man with confusion and decreased speech output.  Medications: acetaminophen (TYLENOL) tablet 650 mg  aspirin tablet 325 mg  baclofen (LIORESAL) tablet 10 mg  clopidogrel (PLAVIX) tablet 75 mg  enoxaparin (LOVENOX) injection 40 mg  ezetimibe (ZETIA) tablet 10 mg  gabapentin (NEURONTIN) capsule 300 mg  insulin aspart (novoLOG) injection 0-5 Units  insulin aspart (novoLOG) injection 0-9 Units  insulin NPH Human (HUMULIN N,NOVOLIN N) injection 17 Units  senna-docusate (Senokot-S) tablet 1 tablet  tamsulosin (FLOMAX) capsule 0.4 mg  traMADol (ULTRAM) tablet 50 mg   Technical Summary: A multichannel digital EEG recording measured by the international 10-20 system with electrodes applied with paste and impedances below 5000 ohms performed as portable with EKG monitoring in an awake and asleep patient.  Hyperventilation and photic stimulation were not performed.  The digital EEG was referentially recorded, reformatted, and digitally filtered in a variety of bipolar and referential montages for optimal display.   Description: The patient is awake and asleep during the recording.  During maximal wakefulness, there is a symmetric, medium voltage 8 Hz posterior dominant rhythm that attenuates with eye opening. This is admixed with a small amount of diffuse 4-6 Hz theta slowing of the waking background.  During drowsiness and sleep, there is an increase in theta slowing of the background.  Vertex waves and symmetric sleep spindles were seen.  Hyperventilation and photic stimulation were not performed. There were no epileptiform discharges or electrographic seizures seen.    EKG lead showed sinus tachycardia.  Impression: This awake and asleep EEG is abnormal due to mild diffuse slowing of  the waking background.  Clinical Correlation of the above findings indicates diffuse cerebral dysfunction that is non-specific in etiology and can be seen with hypoxic/ischemic injury, toxic/metabolic encephalopathies, neurodegenerative disorders, or medication effect.  The absence of epileptiform discharges does not rule out a clinical diagnosis of epilepsy.  Clinical correlation is advised.   Ellouise Newer, M.D.

## 2016-09-20 NOTE — Progress Notes (Signed)
Pt's daughter and wife state he has been unable to void tonight and most of the day.  Pt has attempted to use the urinal but was unable to void.  Pt's daughter reported that in the recent past the pt had been on medication to help him void, but he had run out of the medication.  She stated that he had had difficulty urinating the last time he was hospitalized, and that's when the doctor had prescribed this medication.  Bladder scan was performed which showed 160 mL of urine.  Lamar Blinks, NP notified of the above.  Orders received.  Jodell Cipro

## 2016-09-20 NOTE — Evaluation (Signed)
Physical Therapy Evaluation Patient Details Name: Anthony Holder MRN: [id number] DOB: 04-03-1941 Today's Date: 09/20/2016   History of Present Illness  Anthony Holder is a 75 y.o. male with a history of CAD s/p CABG, severe AS with pulmonary HTN, COPD, DM, PVD followed by [name], CKD stage III, HTN, HLD,  carotid artery disease s/p R CEA who presented with confusion.  Positive for L PCA infarct.  Clinical Impression  Patient presents with decreased independence with mobility due to deficits listed in PT problem list.  He will benefit from skilled PT in the acute setting to allow d/c home with family support and follow up HHPT.     Follow Up Recommendations Home health PT    Equipment Recommendations  None recommended by PT    Recommendations for Other Services       Precautions / Restrictions Precautions Precautions: Fall Precaution Comments: L prosthesis, R heel wedge darco      Mobility  Bed Mobility Overal bed mobility: Needs Assistance Bed Mobility: Supine to Sit     Supine to sit: Mod assist     General bed mobility comments: lifting assist for trunk  Transfers Overall transfer level: Needs assistance Equipment used: Rolling walker (2 wheeled) Transfers: Sit to/from Stand Sit to Stand: Mod assist         General transfer comment: lifting assist from EOB, able to sit with less assist to EOB  Ambulation/Gait Ambulation/Gait assistance: Min assist;Mod assist Ambulation Distance (Feet): 80 Feet Assistive device: Rolling walker (2 wheeled) Gait Pattern/deviations: Step-through pattern;Trunk flexed;Wide base of support     General Gait Details: increased time on turns, HR up to 122 with ambulation without signs of distress  Stairs            Wheelchair Mobility    Modified Rankin (Stroke Patients Only) Modified Rankin (Stroke Patients Only) Pre-Morbid Rankin Score: Moderate disability Modified Rankin: Moderately severe disability      Balance Overall balance assessment: Needs assistance   Sitting balance-Leahy Scale: Good Sitting balance - Comments: leaning posterior with donning L prosthesis with cues, minguard for safety   Standing balance support: Bilateral upper extremity supported Standing balance-Leahy Scale: Poor Standing balance comment: UE support for balance                             Pertinent Vitals/Pain Pain Assessment: No/denies pain    Home Living Family/patient expects to be discharged to:: Private residence Living Arrangements: Spouse/significant other Available Help at Discharge: Family;Available 24 hours/day Type of Home: House Home Access: Level entry     Home Layout: One level Home Equipment: Walker - 2 wheels;Walker - standard;Cane - single point;Toilet riser;Tub bench      Prior Function Level of Independence: Needs assistance   Gait / Transfers Assistance Needed: basically supervision for mobility with std walker and prosthesis  ADL's / Homemaking Assistance Needed: supervision for showering, wears depends        Hand Dominance   Dominant Hand: Right    Extremity/Trunk Assessment   Upper Extremity Assessment: LUE deficits/detail       LUE Deficits / Details: limited shoulder elevation compared to R and slowed coordination compared to    Lower Extremity Assessment: RLE deficits/detail;LLE deficits/detail RLE Deficits / Details: Hip flexion 4/5; knee extension 4/5. ankle DF 4/5 LLE Deficits / Details: hip flexion 4-/5, prosthesis donned     Communication   Communication: HOH  Cognition Arousal/Alertness: Lethargic Behavior During Therapy:  WFL for tasks assessed/performed Overall Cognitive Status: Impaired/Different from baseline Area of Impairment: Orientation;Memory;Following commands;Problem solving Orientation Level: Place;Time;Situation   Memory: Decreased short-term memory Following Commands: Follows one step commands with increased time      Problem Solving: Slow processing;Decreased initiation;Requires verbal cues;Requires tactile cues      General Comments General comments (skin integrity, edema, etc.): Wife in room and reports pt usually functions at supervision level; noted R eye with blurriness since stroke, note some decreased AROM compared to L with lateral movements slower than L; wears glassess normally, but they are not here    Exercises     Assessment/Plan    PT Assessment Patient needs continued PT services  PT Problem List Decreased activity tolerance;Decreased balance;Decreased strength;Decreased mobility;Decreased coordination;Decreased cognition;Decreased knowledge of use of DME;Decreased knowledge of precautions;Decreased safety awareness;Cardiopulmonary status limiting activity          PT Treatment Interventions DME instruction;Gait training;Functional mobility training;Neuromuscular re-education;Balance training;Therapeutic exercise;Therapeutic activities;Patient/family education;Cognitive remediation    PT Goals (Current goals can be found in the Care Plan section)  Acute Rehab PT Goals Patient Stated Goal: To go home PT Goal Formulation: With patient/family Time For Goal Achievement: 09/24/16 Potential to Achieve Goals: Good    Frequency Min 4X/week   Barriers to discharge        Co-evaluation               End of Session Equipment Utilized During Treatment: Gait belt Activity Tolerance: Patient tolerated treatment well Patient left: in bed;with family/visitor present;with call bell/phone within reach           Time: [id number] PT Time Calculation (min) (ACUTE ONLY): 25 min   Charges:   PT Evaluation $PT Eval Moderate Complexity: 1 Procedure PT Treatments $Gait Training: 8-22 mins   PT G CodesReginia Naas 2016-10-19, 2:56 PM  Magda Kiel, [place] October 19, 2016

## 2016-09-20 NOTE — Progress Notes (Signed)
OT Cancellation Note  Patient Details Name: Anthony Holder MRN: [id number] DOB: 03-16-1941   Cancelled Treatment:    Reason Eval/Treat Not Completed: Patient at procedure or test/ unavailable. Will check back later today a sale/as appropriate  Britt Bottom 09/20/2016, 10:34 AM

## 2016-09-20 NOTE — Progress Notes (Signed)
   09/20/16 1500  Clinical Encounter Type  Visited With Patient and family together  Visit Type Other (Comment) (Consult)  Referral From Nurse  Consult/Referral To Chaplain  Spiritual Encounters  Spiritual Needs Prayer;Emotional  Stress Factors  Patient Stress Factors Health changes  Family Stress Factors Family relationships  Stopped by room to offer prayer and emotional support to wife.

## 2016-09-21 ENCOUNTER — Inpatient Hospital Stay (HOSPITAL_COMMUNITY): Payer: Medicare Other

## 2016-09-21 DIAGNOSIS — I639 Cerebral infarction, unspecified: Secondary | ICD-10-CM

## 2016-09-21 DIAGNOSIS — I6789 Other cerebrovascular disease: Secondary | ICD-10-CM

## 2016-09-21 DIAGNOSIS — R569 Unspecified convulsions: Secondary | ICD-10-CM

## 2016-09-21 DIAGNOSIS — E782 Mixed hyperlipidemia: Secondary | ICD-10-CM

## 2016-09-21 DIAGNOSIS — I25810 Atherosclerosis of coronary artery bypass graft(s) without angina pectoris: Secondary | ICD-10-CM

## 2016-09-21 LAB — GLUCOSE, CAPILLARY
GLUCOSE-CAPILLARY: 311 mg/dL — AB (ref 65–99)
Glucose-Capillary: 84 mg/dL (ref 65–99)
Glucose-Capillary: 249 mg/dL — ABNORMAL HIGH (ref 65–99)
Glucose-Capillary: 316 mg/dL — ABNORMAL HIGH (ref 65–99)

## 2016-09-21 LAB — CBC WITH DIFFERENTIAL/PLATELET
BASOS ABS: 0.1 10*3/uL (ref 0.0–0.1)
BASOS PCT: 1 %
EOS ABS: 0.4 10*3/uL (ref 0.0–0.7)
EOS PCT: 4 %
HCT: 32 % — ABNORMAL LOW (ref 39.0–52.0)
Hemoglobin: 9.9 g/dL — ABNORMAL LOW (ref 13.0–17.0)
Lymphocytes Relative: 20 %
Lymphs Abs: 1.9 10*3/uL (ref 0.7–4.0)
MCH: 27 pg (ref 26.0–34.0)
MCHC: 30.9 g/dL (ref 30.0–36.0)
MCV: 87.4 fL (ref 78.0–100.0)
Monocytes Absolute: 1 10*3/uL (ref 0.1–1.0)
Monocytes Relative: 11 %
Neutro Abs: 6.2 10*3/uL (ref 1.7–7.7)
Neutrophils Relative %: 64 %
PLATELETS: 246 10*3/uL (ref 150–400)
RBC: 3.66 MIL/uL — AB (ref 4.22–5.81)
RDW: 16 % — ABNORMAL HIGH (ref 11.5–15.5)
WBC: 9.5 10*3/uL (ref 4.0–10.5)

## 2016-09-21 LAB — COMPREHENSIVE METABOLIC PANEL
ALT: 7 U/L — AB (ref 17–63)
AST: 13 U/L — AB (ref 15–41)
Albumin: 3.1 g/dL — ABNORMAL LOW (ref 3.5–5.0)
Alkaline Phosphatase: 39 U/L (ref 38–126)
Anion gap: 5 (ref 5–15)
BUN: 16 mg/dL (ref 6–20)
CHLORIDE: 107 mmol/L (ref 101–111)
CO2: 24 mmol/L (ref 22–32)
CREATININE: 1.15 mg/dL (ref 0.61–1.24)
Calcium: 9.2 mg/dL (ref 8.9–10.3)
GFR calc Af Amer: >60 mL/min (ref >60)
GFR calc non Af Amer: >60 mL/min (ref >60)
Glucose, Bld: 105 mg/dL — ABNORMAL HIGH (ref 65–99)
Potassium: 3.5 mmol/L (ref 3.5–5.1)
SODIUM: 136 mmol/L (ref 135–145)
Total Bilirubin: 0.5 mg/dL (ref 0.3–1.2)
Total Protein: 7 g/dL (ref 6.5–8.1)

## 2016-09-21 LAB — ECHOCARDIOGRAM COMPLETE
Height: 70 in
WEIGHTICAEL: 2880 [oz_av]

## 2016-09-21 LAB — PROCALCITONIN

## 2016-09-21 MED ORDER — PERFLUTREN LIPID MICROSPHERE
INTRAVENOUS | Status: AC
  Administered 2016-09-21: 3 mL via INTRAVENOUS
  Filled 2016-09-21: qty 10

## 2016-09-21 MED ORDER — CARVEDILOL 3.125 MG PO TABS
3.1250 mg | ORAL_TABLET | Freq: Two times a day (BID) | ORAL | Status: DC
  Administered 2016-09-21 – 2016-09-23 (×5): 3.125 mg via ORAL
  Filled 2016-09-21 (×5): qty 1

## 2016-09-21 MED ORDER — PERFLUTREN LIPID MICROSPHERE
1.0000 mL | INTRAVENOUS | Status: AC | PRN
  Administered 2016-09-21: 3 mL via INTRAVENOUS
  Filled 2016-09-21: qty 10

## 2016-09-21 NOTE — Progress Notes (Signed)
  Echocardiogram 2D Echocardiogram has been performed.  Donata Clay 09/21/2016, 1:49 PM

## 2016-09-21 NOTE — Evaluation (Signed)
Occupational Therapy Evaluation Patient Details Name: Anthony Holder MRN: [id number] DOB: 1941-07-22 Today's Date: 09/21/2016    History of Present Illness Anthony Holder is a 75 y.o. male with a history of CAD s/p CABG, severe AS with pulmonary HTN, COPD, DM, PVD followed by Anthony Holder, CKD stage III, HTN, HLD,  carotid artery disease s/p R CEA who presented with confusion.  Positive for L PCA infarct.   Clinical Impression   Pt with decline in function and safety with ADLs and ADL mobility with decreased strength, balance, endurance, R UE coordination, R side visual deficits and cognition. Pt would benefit from acute OT services to address impairments to increase level of function and safety    Follow Up Recommendations  Home health OT;Supervision/Assistance - 24 hour    Equipment Recommendations  3 in 1 bedside comode    Recommendations for Other Services       Precautions / Restrictions Precautions Precautions: Fall Precaution Comments: L prosthesis, R heel wedge darco Restrictions Weight Bearing Restrictions: No      Mobility Bed Mobility Overal bed mobility: Needs Assistance Bed Mobility: Supine to Sit     Supine to sit: Min assist     General bed mobility comments: mod verbal cues to use rails, min A to elevate trunk. Increased time  Transfers                      Balance Overall balance assessment: Needs assistance   Sitting balance-Leahy Scale: Good                                      ADL Overall ADL's : Needs assistance/impaired     Grooming: Wash/dry hands;Wash/dry face;Sitting Grooming Details (indicate cue type and reason): cues for R side completion Upper Body Bathing: Sitting;Min guard Upper Body Bathing Details (indicate cue type and reason): cues for R side completion Lower Body Bathing: Minimal assistance   Upper Body Dressing : Min guard;Sitting Upper Body Dressing Details (indicate cue type and reason): cues  for R side completion Lower Body Dressing: Sitting/lateral leans;Minimal assistance   Toilet Transfer: Stand-pivot;Minimal assistance;BSC   Toileting- Clothing Manipulation and Hygiene: Minimal assistance       Functional mobility during ADLs: Minimal assistance;Cueing for safety General ADL Comments: pt and wife educated on DME for toilet safety. ADL and ADL mobility safety education due to R peripheral field impairments     Vision Vision Assessment?: Yes Visual Fields: Right visual field deficit              Pertinent Vitals/Pain Pain Assessment: No/denies pain     Hand Dominance Right   Extremity/Trunk Assessment Upper Extremity Assessment Upper Extremity Assessment: LUE deficits/detail;Overall WFL for tasks assessed LUE Deficits / Details: shoulder elvation limited, coordination slow/delayed   Lower Extremity Assessment Lower Extremity Assessment: Defer to PT evaluation       Communication Communication Communication: HOH   Cognition Arousal/Alertness: Awake/alert Behavior During Therapy: WFL for tasks assessed/performed;Flat affect Overall Cognitive Status: Impaired/Different from baseline Area of Impairment: Orientation;Memory;Following commands;Problem solving Orientation Level: Place;Time;Situation   Memory: Decreased short-term memory Following Commands: Follows one step commands with increased time     Problem Solving: Slow processing;Decreased initiation;Requires verbal cues;Requires tactile cues     General Comments   pt very pleasant and cooperative  Home Living Family/patient expects to be discharged to:: Private residence Living Arrangements: Spouse/significant other Available Help at Discharge: Family;Available 24 hours/day Type of Home: House Home Access: Level entry     Home Layout: One level     Bathroom Shower/Tub: Teacher, early years/pre: Standard Bathroom Accessibility: Yes   Home Equipment:  Environmental consultant - 2 wheels;Walker - standard;Cane - single point;Toilet riser;Tub bench          Prior Functioning/Environment Level of Independence: Needs assistance  Gait / Transfers Assistance Needed: basically supervision for mobility with std walker and prosthesis ADL's / Homemaking Assistance Needed: supervision for showering, wears depends   Comments: pt wife reports up until a few wks ago pt ws I with ADLS and mod I with SPC for amb        OT Problem List: Decreased strength;Impaired balance (sitting and/or standing);Decreased cognition;Decreased activity tolerance;Decreased coordination;Decreased knowledge of use of DME or AE   OT Treatment/Interventions: Self-care/ADL training;DME and/or AE instruction;Therapeutic activities;Therapeutic exercise;Patient/family education;Neuromuscular education    OT Goals(Current goals can be found in the care plan section) Acute Rehab OT Goals Patient Stated Goal: To go home OT Goal Formulation: With patient/family Time For Goal Achievement: 09/28/16 Potential to Achieve Goals: Good ADL Goals Pt Will Perform Grooming: with min assist;with min guard assist;standing;with caregiver independent in assisting Pt Will Perform Upper Body Bathing: with supervision;with set-up;sitting;with caregiver independent in assisting Pt Will Perform Lower Body Bathing: with min assist;with min guard assist;with caregiver independent in assisting Pt Will Perform Upper Body Dressing: with supervision;with set-up;with caregiver independent in assisting;sitting Pt Will Perform Lower Body Dressing: with min assist;with min guard assist;with caregiver independent in assisting Pt Will Transfer to Toilet: with min guard assist;regular height toilet;bedside commode Pt Will Perform Toileting - Clothing Manipulation and hygiene: with min guard assist;sit to/from stand Pt Will Perform Tub/Shower Transfer: with min assist;with min guard assist;tub bench Additional ADL Goal #1: Pt  will identify 3/4 items and or obctacles on R side during  ADLs and ADL mobility  OT Frequency: Min 2X/week   Barriers to D/C:    no barriers                     End of Session Equipment Utilized During Treatment: Gait belt;Other (comment) (BSC)  Activity Tolerance: Patient tolerated treatment well Patient left: in bed;with call bell/phone within reach;with family/visitor present (sitting EOB)   Time: [id number] OT Time Calculation (min): 24 min Charges:  OT General Charges $OT Visit: 1 Procedure OT Evaluation $OT Eval Moderate Complexity: 1 Procedure OT Treatments $Therapeutic Activity: 8-22 mins G-Codes:    Britt Bottom 09/21/2016, 11:26 AM

## 2016-09-21 NOTE — Progress Notes (Signed)
LTM EEG started, family in room given instructions on event monitor button and its use. Family also aware event button does not page nurse.

## 2016-09-21 NOTE — Progress Notes (Signed)
PROGRESS NOTE    Anthony Holder  [id number] DOB: 08-21-1941 DOA: 09/18/2016 PCP: [name], MD   Brief Narrative:  Anthony Holder is a 75 y.o. male with a history of CAD s/p CABG x5, severe AS with pulmonary HTN, COPD, DM, PVD followed by [name], CKD stage III, HTN, HLD,  carotid artery disease s/p R CEA, Ischemic Cardiomyopathy and Systolic CHF with an EF of 25%, Left BKA and Transmetatarsal amputation and other comorbidities who presented with confusion x 1 day. He was worked up and admitted and found to have an Acute Infarct within the Left PCA. Neurology was consulted and Stroke Team Following. Cardiology Consulted to make recommendations about elevated Troponin's. He was somnolent but arousable today and did not want to participate in his examination.   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 Acute Infarct within the Left Posterior Cerebral Artery  2. ? Seizure Activity 3. Elevated Troponin's, improving 4. Hypertension 5. Hyperlipidemia 6. Ischemic Cardiomyopathy with EF of 25% on last Echo 7. Severe Aortic Stenosis 8. Chronic Kidney Disease Stage 3 9. Peripherial Vascular Disease 10. 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 Lactic Acidosis, resolved  PLAN  1. Acute Infarct within the Left Posterior Cerebral Artery  Neurology Stroke Team Consulted and Appreciate Recommendations. Discussed Case with Dr. Erlinda Hong. Continue to follow Neurology  Recommendations  -CT Head showed No acute intracranial abnormalities. Chronic atrophy and small vessel ischemic changes.  -MRI of Head Showed 1. Acute infarct within the left posterior cerebral artery distribution. No hemorrhage identified. 2. Background of moderate chronic microvascular ischemic changes and parenchymal volume loss. 3. 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  -CT Angio of Head and Neck showed: Left PCA occlusion in the distal P2 segment, corresponding to the acute left PCA infarct. 2. Right Vertebral Artery occlusion (short segment reconstitution from muscular branches in the distal V2 segment). 3. 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 4. Right PCA P1 segment occlusion and poor reconstitution of the right PCA branches from the right posterior communicating artery which is highly atherosclerotic and stenotic. 5. Radiographic string sign stenosis of the Right ICA siphon in the distal petrous segment. At least moderate tandem stenoses from dense calcified plaque in the right cavernous and supraclinoid ICA segments, but the right ICA terminus remains patent. 6. High-grade stenosis versus short segment occlusion of the Left ICA supraclinoid segment. The left ICA terminus is patent but might be supplied from the left posterior communicating artery. 7. Mild to moderate bilateral MCA irregularity and stenosis, including moderate stenosis at the left MCA bifurcation. 8. Left greater than right carotid bifurcation atherosclerosis. Up to 60% left ICA bulb stenosis. 9. Bilateral pleural effusions with pulmonary ground-glass opacity and reactive appearing superior mediastinal lymphadenopathy. This constellation might reflect congestive heart failure with pulmonary edema. 10. Stable CT appearance of the brain.  -Transthoracic  Echocardiogram ordered and pending   -  EEG shows This awake and asleep EEG is abnormal due to mild diffuse slowing of the waking background. Clinical Correlation of the above findings indicates diffuse cerebral dysfunction that is non-specific in etiology and can be seen with hypoxic/ischemic injury, toxic/metabolic encephalopathies, neurodegenerative disorders, or medication effect.  The absence of epileptiform discharges does not rule out a clinical diagnosis of epilepsy.   -UDS Positive for Benzodiazepines   -LE Dopplers Preliminary Report showed There is no DVT or SVT noted in the visualized veins of the bilateral lower extremities -Lipid Panel showed Cholesterol of 153, TG of 150, HDL of 20, LDL of 103, and VLDL of 30 -Dual Antiplatelet Therapy with ASA 325 mg po Daily and Clopidogrel 75 mg po Daily per Neuro Reccomendations.  -HbA1c Pending -PT/OT/SLP Evaluate and treat - PT/OT/SLP recommends Home Health PT/OT/SLP -Neurochecks per protocol -Continue with Bedside Telemetry  -Allow for Permissive HTN -Neuro Follow up in 6 weeks with Dr. Krista Blue at [facility].  -Will most likely D/C in AM if patient not seizing .   2. ? Seizure Like Activity -Granddaughter gave convincing story of ? Seizures in night with shaking, incontinence, and possible post-ictal confusion -Asked Neurology Dr. Erlinda Hong to re-evaluate and appreciate his Recc's.  -Will obtain Overnight LTM EEG -Further recommendations and treatment guided by findings on EEG. Possible Empiric Tx.   3. Elevated Troponins -EKG showed some mild ST Depression on my Read -Troponins Trended to 1.73 -> 1.72 -> 1.62 -> 1.44 -> 1.53 -> 1.32 -> 1.05. Will stop Checking -Appreciate Cardiology Consult Evaluation and Recommendations. - Cardiology Thinks it is unlikely ACS. Troponin elevation could be 2/2 to Acute CVA and CKD  4. Hypertension -Allow for Permissive HTN (<180/105) -Hold Carvedilol and Furosemide -No ACE/ARB because of  CKD; Possibility to start sometime via Cardiology Recc's  5. Hyperlipidemia -Lipid Panel showed Cholesterol of 153, TG of 150, HDL of 20, LDL of 103, and VLDL of 30 -C/w Ezetimbe 10 mg po Daily as patient is Statin Intolerant -Referral made to [facility] by Cardiology as Patient may be candidate for PCSK9 Candidate.  6. Ischemic Cardiomyopathy with EF of 25%, currently not in exacerbation -Repeat ECHO PENDING -Hold Carvediolol and Furosemide; May restart with Cardiology Recc's  -No ACE/ARB 2/2 to CKD; Possibility to start sometime via Cardiology Recc's  7. Severe Aortic Stenosis -Considered and Evaluated for TAVR after her recovers from his illness and recent R Transmetatarsal Surgery.  -Repeat ECHO PENDING  8. CKD Stage 3 -BUN/Cr on Repeat Labwork was 22/1.27 respectively -> 22/1.32 today -Repeat BMP in AM  9. Peripheral Vascular Disease -C/w Plavix 75 mg and Zetia 10 mg   10. Insulin Dependent Diabetes Mellitus Type 2 complicated with Peripheral Neuropathy -CBG was 289 -C/w Novolog SSI AC and HS along with Insulin NPH 17 Units sq BID -Hypoglycemia Protocol in Place -Metformin held   11. Lactic Acidosis, Resolved -Continue to Hold Metformin -D/C'd Abx of Ceftriaxone and Azithromycin -LA went from 2.28 -> 1.9 -Procalcitonin 0.11; No WBC and patients afebrile unlikely Infectious and Sepsis  DVT prophylaxis: Lovenox Code Status: FULL Family Communication: Discussed Plan of Care with Patient's wife  Disposition Plan: Patient will likely be D/C'd to [place] in AM pending studies and evaluation.    Consultants:   -Neurology Stroke Team Dr. Erlinda Hong  -Cardiology Dr. Shelva Majestic  Procedures: ECHO Still Pending; Overnight LTM EEG  Antimicrobials: D/C'd Ceftriaxone and Azithromycin  Subjective: Patient was seen and examined at bedside this AM and he was sleeping. He  was very somnolent but arousable with Sternal Rub. Would not cooperate in questioning as all he wanted to do  was sleep.   Objective: Vitals:   09/21/16 0810 09/21/16 1259 09/21/16 1421 09/21/16 1646  BP:  127/75 127/75   Pulse:  (!) 104 (!) 113   Resp:   15 17  Temp: 97.8 F (36.6 C)  98.3 F (36.8 C) 98.3 F (36.8 C)  TempSrc: Axillary  Oral Oral  SpO2:   94% 97%  Weight:      Height:        Intake/Output Summary (Last 24 hours) at 09/21/16 1715 Last data filed at 09/21/16 0900  Gross per 24 hour  Intake              480 ml  Output                0 ml  Net              480 ml   Filed Weights   09/18/16 2107 09/19/16 0455 09/21/16 0521  Weight: 84.8 kg (187 lb) 82.9 kg (182 lb 12.8 oz) 81.6 kg (180 lb)    Examination: Physical Exam:  Constitutional:  Elderly sick appearing Caucasian male who looks older than stated age sitting at edge of bed.  Eyes:  lids and conjunctivae normal, sclerae anicteric. Has visual field defect.  ENMT: External Ears, Nose appear normal. No dentition as patient didn't bring teeth.  Neck: Appears normal, supple, no cervical masses, normal ROM, no appreciable thyromegaly no JVD appreciated.  Respiratory: Clear to auscultation bilaterally, no wheezing, rales, rhonchi or crackles. Normal respiratory effort and patient is not tachypenic. No accessory muscle use.  Cardiovascular: RRR, Loud 4/6 Systolic Murmur best heard at RSB. No rubs / gallops. S1 and S2 auscultated. No extremity edema.  Abdomen: Soft, non-tender, non-distended. No masses palpated. No appreciable hepatosplenomegaly. Bowel sounds positive.  GU: Deferred. Musculoskeletal: No clubbing / cyanosis of digits/nails. Patient with Left BKA and with Right Transmetatarsal Amputation with wound debrided with good granulation tissue. No contractures. Normal strength and muscle tone in Upper Extremities.  Skin: No rashes, lesions, ulcers. No induration; Warm and dry.  Neurologic: Awake and alert and oriented x3. Muscle Strength 5/5 in Ue.  Psychiatric: Normal judgment and insight. Normal mood and Flat  affect.   Data Reviewed: I have reviewed the following.  CBC:  Recent Labs Lab 09/18/16 2114 09/19/16 0847 09/20/16 0044 09/21/16 0458  WBC 14.6* 10.7* 10.7* 9.5  NEUTROABS 10.1* 7.2 7.0 6.2  HGB 11.0* 10.1* 10.0* 9.9*  HCT 35.1* 31.5* 32.2* 32.0*  MCV 88.6 87.5 88.2 87.4  PLT 302 298 251 0000000   Basic Metabolic Panel:  Recent Labs Lab 09/18/16 2113 09/19/16 0847 09/20/16 0044 09/21/16 0458  NA 135 135 136 136  K 4.9 4.2 3.9 3.5  CL 101 101 103 107  CO2 21* 24 23 24   GLUCOSE 280* 289* 125* 105*  BUN 24* 22* 22* 16  CREATININE 1.42* 1.27* 1.32* 1.15  CALCIUM 10.0 9.6 9.4 9.2  MG  --  1.8  --   --   PHOS  --  3.0  --   --    GFR: Estimated Creatinine Clearance: 58.2 mL/min (by C-G formula based on SCr of 1.15 mg/dL). Liver Function Tests:  Recent Labs Lab 09/18/16 2113 09/19/16 0847 09/20/16 0044 09/21/16 0458  AST 18 16 13* 13*  ALT 9* 8* 9* 7*  ALKPHOS 46 41 40 39  BILITOT 0.8 0.6  0.7 0.5  PROT 8.1 7.8 7.5 7.0  ALBUMIN 3.7 3.4* 3.2* 3.1*   No results for input(s): LIPASE, AMYLASE in the last 168 hours. No results for input(s): AMMONIA in the last 168 hours. Coagulation Profile: No results for input(s): INR, PROTIME in the last 168 hours. Cardiac Enzymes:  Recent Labs Lab 09/19/16 1319 09/19/16 1913 09/20/16 0044 09/20/16 0802 09/20/16 1307  TROPONINI 1.62* 1.44* 1.53* 1.32* 1.05*   BNP (last 3 results) No results for input(s): PROBNP in the last 8760 hours. HbA1C:  Recent Labs  09/19/16 0425  HGBA1C 6.9*   CBG:  Recent Labs Lab 09/20/16 1615 09/20/16 2159 09/21/16 0749 09/21/16 1126 09/21/16 1620  GLUCAP 209* 238* 84 311* 249*   Lipid Profile:  Recent Labs  09/19/16 0425  CHOL 153  HDL 20*  LDLCALC 103*  TRIG 150*  CHOLHDL 7.7   Thyroid Function Tests: No results for input(s): TSH, T4TOTAL, FREET4, T3FREE, THYROIDAB in the last 72 hours. Anemia Panel: No results for input(s): VITAMINB12, FOLATE, FERRITIN, TIBC, IRON,  RETICCTPCT in the last 72 hours. Sepsis Labs:  Recent Labs Lab 09/18/16 2133 09/19/16 0053 09/19/16 0425 09/21/16 0458  PROCALCITON  --   --  0.11 <0.10  LATICACIDVEN 3.55* 2.28* 1.9  --     Recent Results (from the past 240 hour(s))  Urine culture     Status: None   Collection Time: 09/18/16 12:13 AM  Result Value Ref Range Status   Specimen Description URINE, CATHETERIZED  Final   Special Requests NONE  Final   Culture NO GROWTH  Final   Report Status 09/20/2016 FINAL  Final  Culture, blood (Routine X 2) w Reflex to ID Panel     Status: None (Preliminary result)   Collection Time: 09/19/16 12:40 AM  Result Value Ref Range Status   Specimen Description BLOOD RIGHT HAND  Final   Special Requests IN PEDIATRIC BOTTLE 1CC  Final   Culture NO GROWTH 2 DAYS  Final   Report Status PENDING  Incomplete  Culture, blood (Routine X 2) w Reflex to ID Panel     Status: None (Preliminary result)   Collection Time: 09/19/16 12:51 AM  Result Value Ref Range Status   Specimen Description BLOOD LEFT HAND  Final   Special Requests IN PEDIATRIC BOTTLE 1CC  Final   Culture NO GROWTH 2 DAYS  Final   Report Status PENDING  Incomplete     Radiology Studies: No results found. ECHOCARDIOGRAM: PENDING  Scheduled Meds: . aspirin  300 mg Rectal Daily   Or  . aspirin  325 mg Oral Daily  . baclofen  10 mg Oral Daily  . carvedilol  3.125 mg Oral BID WC  . clopidogrel  75 mg Oral Daily  . enoxaparin (LOVENOX) injection  40 mg Subcutaneous Daily  . ezetimibe  10 mg Oral Daily  . gabapentin  300 mg Oral TID  . insulin aspart  0-5 Units Subcutaneous QHS  . insulin aspart  0-9 Units Subcutaneous TID WC  . insulin NPH Human  17 Units Subcutaneous BID AC & HS  . tamsulosin  0.4 mg Oral Daily   Continuous Infusions:    LOS: 2 days   Kerney Elbe, DO [facility] Pager [phone]  If 7PM-7AM, please contact night-coverage [email or web address] Password [email or web address] 09/21/2016, 5:15 PM

## 2016-09-21 NOTE — Progress Notes (Signed)
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 Percival Spanish  75 y.o. male w/ PMH of CAD (s/p CABG x5 in 2010, cath 05/2016 with 4/5 patent grafts), severe AS with pulmonary HTN, COPD, DM, PVD (s/p left BKA - followed by [name]), Stage 3 CKD, HTN, HLD, and carotid artery disease (s/p R CEA) admitted on 09/18/2016 for acute CVA. Cards consulted for elevated troponin.   Subjective   Grand daughter at bed side--> who states that patient few episodes of seizure overnight. No chest pain or dyspnea.   Inpatient Medications    . aspirin  300 mg Rectal Daily   Or  . aspirin  325 mg Oral Daily  . baclofen  10 mg Oral Daily  . clopidogrel  75 mg Oral Daily  . enoxaparin (LOVENOX) injection  40 mg Subcutaneous Daily  . ezetimibe  10 mg Oral Daily  . gabapentin  300 mg Oral TID  . insulin aspart  0-5 Units Subcutaneous QHS  . insulin aspart  0-9 Units Subcutaneous TID WC  . insulin NPH Human  17 Units Subcutaneous BID AC & HS  . tamsulosin  0.4 mg Oral Daily    Vital Signs    Vitals:   09/20/16 2001 09/21/16 0028 09/21/16 0521 09/21/16 0810  BP: (!) 107/53 139/86 (!) 101/56   Pulse: 99 (!) 105 99   Resp: 16 18 10    Temp: 98.1 F (36.7 C) 98.2 F (36.8 C) 98.2 F (36.8 C) 97.8 F (36.6 C)  TempSrc: Oral Oral Oral Axillary  SpO2: 97% 97% 92%   Weight:   180 lb (81.6 kg)   Height:        Intake/Output Summary (Last 24 hours) at 09/21/16 0921 Last data filed at 09/20/16 1746  Gross per 24 hour    Intake              480 ml  Output                0 ml  Net              480 ml   Filed Weights   09/18/16 2107 09/19/16 0455 09/21/16 0521  Weight: 187 lb (84.8 kg) 182 lb 12.8 oz (82.9 kg) 180 lb (81.6 kg)    Physical Exam    General: Elderly Caucasian  male appearing in no acute distress. Head: Normocephalic, atraumatic.  Neck: Supple without bruits, JVD not elevated. Lungs:  Resp regular and unlabored, CTA without wheezing or rales. Heart: RRR, S1, S2, no S3, S4, 3/6 SEM at RUSB; no rub. Abdomen: Soft, non-tender, non-distended with normoactive bowel sounds. No hepatomegaly. No rebound/guarding. No obvious abdominal masses. Extremities: No clubbing, cyanosis, or edema. Left BKA. Right lower extremity digits removed with healing-wound noted. Neuro: Alert and oriented X 3. Moves all extremities spontaneously. Psych: Normal affect.  Labs    CBC  Recent Labs  09/20/16 0044 09/21/16 0458  WBC 10.7* 9.5  NEUTROABS 7.0 6.2  HGB 10.0* 9.9*  HCT 32.2* 32.0*  MCV 88.2 87.4  PLT 251 0000000   Basic Metabolic Panel  Recent Labs  09/19/16 0847 09/20/16 0044 09/21/16 0458  NA 135 136 136  K 4.2 3.9 3.5  CL 101 103 107  CO2 24 23 24   GLUCOSE 289* 125* 105*  BUN 22* 22* 16  CREATININE 1.27* 1.32* 1.15  CALCIUM 9.6 9.4 9.2  MG 1.8  --   --   PHOS 3.0  --   --    Liver Function Tests  Recent Labs  09/20/16 0044 09/21/16 0458  AST 13* 13*  ALT 9* 7*  ALKPHOS 40 39  BILITOT 0.7 0.5  PROT 7.5 7.0  ALBUMIN 3.2* 3.1*   No results for input(s): LIPASE, AMYLASE in the last 72 hours. Cardiac Enzymes  Recent Labs  09/20/16 0044 09/20/16 0802 09/20/16 1307  TROPONINI 1.53* 1.32* 1.05*   BNP Invalid input(s): POCBNP D-Dimer No results for input(s): DDIMER in the last 72 hours. Hemoglobin A1C  Recent Labs  09/19/16 0425  HGBA1C 6.9*   Fasting Lipid Panel  Recent Labs  09/19/16 0425  CHOL 153  HDL 20*  LDLCALC 103*  TRIG 150*  CHOLHDL 7.7    Thyroid Function Tests No results for input(s): TSH, T4TOTAL, T3FREE, THYROIDAB in the last 72 hours.  Invalid input(s): FREET3  Telemetry    NSR, HR in 100s  ECG    No new tracings.    Cardiac Studies and Radiology    Ct Angio Head W Or Wo Contrast  Addendum Date: 09/19/2016   ADDENDUM REPORT: 09/19/2016 14:12 ADDENDUM: Study discussed by telephone with Dr. Rosalin Hawking on 09/19/2016 at 1350 hours. Electronically Signed   By: Genevie Ann M.D.   On: 09/19/2016 14:12   Result Date: 09/19/2016 CLINICAL DATA:  75 year old male with left PCA infarct discovered on MRI for acute confusion. Initial encounter. EXAM: CT ANGIOGRAPHY HEAD AND NECK TECHNIQUE: Multidetector CT imaging of the head and neck was performed using the standard protocol during bolus administration of intravenous contrast. Multiplanar CT image reconstructions and MIPs were obtained to evaluate the vascular anatomy. Carotid stenosis measurements (when applicable) are obtained utilizing NASCET criteria, using the distal internal carotid diameter as the denominator. CONTRAST:  50 mL Isovue 370 COMPARISON:  Brain MRI 0133 hours today, and earlier. FINDINGS: CTA NECK Skeleton: Previous cervical C3-C4 absent dentition. No acute osseous abnormality identified. Visualized paranasal sinuses and mastoids are stable and well pneumatized. And C4-C5 ACDF. Previous median sternotomy. Upper chest: Small layering bilateral pleural effusions, that on the left also tracks into the major fissure. Septal thickening and mosaic attenuation in the bilateral lung parenchyma. Right peritracheal lymphadenopathy with individual nodes measuring 16 mm. Anterior carina nodes measure up to 13 mm. Sequelae of CABG. Other neck: Partially calcified 16 mm left thyroid hypodense nodule which meets consensus criteria for ultrasound follow-up. Negative larynx, pharynx, parapharyngeal spaces, retropharyngeal space, sublingual space, submandibular glands and parotid glands. No  cervical lymphadenopathy. Aortic arch: Calcified aortic atherosclerosis. Three vessel arch configuration. Soft arch atherosclerosis as well. Right carotid system: Right CCA soft and calcified plaque resulting in stenosis of less than 50 % with respect to the distal vessel. Soft plaque continues in the posterior right CCA. There is soft plaque with less than 50% stenosis just proximal to the right carotid bifurcation. At the bifurcation there is soft and calcified plaque. No right ICA origin or bulb stenosis. There is soft and calcified plaque at the right skullbase resulting in less than 50 %  stenosis with respect to the distal vessel. Left carotid system: No left CCA origin stenosis. Soft plaque intermittently in the left CCA proximal to the bifurcation. Soft and calcified plaque at the bifurcation resulting in stenosis in the bulb up to 55-60 % with respect to the distal vessel. See series 401, image 87. There is soft plaque in the left ICA at the skullbase resulting in. Less than 50 % stenosis with respect to the distal vessel Vertebral arteries: No proximal right subclavian artery stenosis despite soft and calcified plaque. 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 There is a calcification at the origin. The right vertebral artery is occluded up to the C2-C3 level where it appears to be mildly reconstituted from muscular branches, but is highly diminutive and terminates outside the skull. Soft plaque in the proximal left subclavian artery with less than 50 % stenosis with respect to the distal vessel. Soft and calcified plaque at the left vertebral artery origin with severe stenosis (series 405, image 121). Tortuous left V1 segment. Minimal plaque in the left V2 segment. Mild left V3 segment plaque without significant stenosis. CTA HEAD Posterior circulation: Patent distal left vertebral artery which appears somewhat dominant has no stenosis to the basilar. There is mild left V4 segment plaque. Both a ICAs  appear dominant. The distal right vertebral artery is occluded. There is moderate basilar artery irregularity and mild stenosis of the basilar just distal to the AICA origins. The basilar remains patent. The SCA and left PCA origins are normal. The left posterior communicating artery is present. The left PCA is occluded in the distal P2 segment with no reconstituted flow. The right P1 is occluded but the right posterior communicating artery is patent although moderately to severely irregular and stenotic. Still, the right PCA branches are patent but attenuated. Anterior circulation: The right ICA siphon is patent but there is radiographic string sign stenosis of the siphon in the vertical petrous segment (series 402, image 249 and at least moderate superimposed stenosis in the cavernous and supraclinoid segments. Still, the right ICA terminus is patent. The right posterior communicating and right ophthalmic artery origins are normal. The left ICA siphon is patent also with moderate to severe atherosclerosis. There is moderate stenosis in the left cavernous segment and severe stenosis in the left supraclinoid ICA related to confluent calcified plaque. In fat, there might be a short segment occlusion of the supraclinoid ICA segment, with the left ICA terminus supplied from the left posterior communicating artery. Nevertheless, both carotid termini are patent. The left MCA and ACA origins are within normal limits. The right ACA A1 segment is diminutive or absent. The anterior communicating artery and bilateral ACA branches are within normal limits. The right MCA origin is mildly irregular and stenotic. The right M1 remains patent. The right MCA bifurcation is patent. There is up to moderate stenosis at the origin of the anterior right MCA M2 branch. Other visualized right MCA branches are within normal limits. The left MCA proximal M1 segment is mildly irregular and stenotic. There is moderate irregularity and stenosis  at the left MCA bifurcation. The left M2 branches remain patent, and are within normal limits. Venous sinuses: Negative. Anatomic variants: Dominant left ACA A1 segment. Delayed phase: Left PCA infarct remain subtle by CT. No abnormal enhancement identified. Review of the MIP images confirms the above findings IMPRESSION: 1. Left PCA occlusion in the distal P2 segment, corresponding to the acute left PCA infarct. 2. Right Vertebral Artery occlusion (short segment reconstitution  from muscular branches in the distal V2 segment). 3. 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 4. Right PCA P1 segment occlusion and poor reconstitution of the right PCA branches from the right posterior communicating artery which is highly atherosclerotic and stenotic. 5. Radiographic string sign stenosis of the Right ICA siphon in the distal petrous segment. At least moderate tandem stenoses from dense calcified plaque in the right cavernous and supraclinoid ICA segments, but the right ICA terminus remains patent. 6. High-grade stenosis versus short segment occlusion of the Left ICA supraclinoid segment. The left ICA terminus is patent but might be supplied from the left posterior communicating artery. 7. Mild to moderate bilateral MCA irregularity and stenosis, including moderate stenosis at the left MCA bifurcation. 8. Left greater than right carotid bifurcation atherosclerosis. Up to 60% left ICA bulb stenosis. 9. Bilateral pleural effusions with pulmonary ground-glass opacity and reactive appearing superior mediastinal lymphadenopathy. This constellation might reflect congestive heart failure with pulmonary edema. 10. Stable CT appearance of the brain. Electronically Signed: By: Genevie Ann M.D. On: 09/19/2016 13:44   Dg Chest 2 View  Result Date: 09/18/2016 CLINICAL DATA:  New left arm tremor. Pain in the left shoulder. Confusion today. Nausea and  vomiting. Abnormal EKG. EXAM: CHEST  2 VIEW COMPARISON:  06/05/2016 FINDINGS: Postoperative changes in the mediastinum. Heart size and pulmonary vascularity are normal for technique. Blunting of the left costophrenic angle suggesting small pleural effusion. Mild atelectasis in the left face. Right lung is clear. No pneumothorax calcification of the aorta. Degenerative changes in the spine and shoulders. Postoperative change in the cervical spine. IMPRESSION: Small left pleural effusion with basilar atelectasis. Electronically Signed   By: Lucienne Capers M.D.   On: 09/18/2016 22:35   Ct Head Wo Contrast  Result Date: 09/19/2016 CLINICAL DATA:  Altered mental status. New left arm tremor. Left shoulder pain. Confusion today. EXAM: CT HEAD WITHOUT CONTRAST TECHNIQUE: Contiguous axial images were obtained from the base of the skull through the vertex without intravenous contrast. COMPARISON:  CT head 05/14/2013.  MRI brain 05/15/2013. FINDINGS: Brain: Diffuse cerebral atrophy. Ventricular dilatation consistent with central atrophy. Low-attenuation changes in the deep white matter consistent small vessel ischemia. No mass effect or midline shift. No abnormal extra-axial fluid collections. Gray-white matter junctions are distinct. Basal cisterns are not effaced. No evidence of acute intracranial hemorrhage. Vascular: Atherosclerotic vascular calcifications are present. Skull: Normal. Negative for fracture or focal lesion. Sinuses/Orbits: Visualized paranasal sinuses are clear. Partial opacification of some of the right mastoid air cells. Left mastoids are clear. Other: No significant changes since previous study. IMPRESSION: No acute intracranial abnormalities. Chronic atrophy and small vessel ischemic changes. Electronically Signed   By: Lucienne Capers M.D.   On: 09/19/2016 00:23   Ct Angio Neck W Or Wo Contrast  Addendum Date: 09/19/2016   ADDENDUM REPORT: 09/19/2016 14:12 ADDENDUM: Study discussed by  telephone with Dr. Rosalin Hawking on 09/19/2016 at 1350 hours. Electronically Signed   By: Genevie Ann M.D.   On: 09/19/2016 14:12   Result Date: 09/19/2016 CLINICAL DATA:  75 year old male with left PCA infarct discovered on MRI for acute confusion. Initial encounter. EXAM: CT ANGIOGRAPHY HEAD AND NECK TECHNIQUE: Multidetector CT imaging of the head and neck was performed using the standard protocol during bolus administration of intravenous contrast. Multiplanar CT image reconstructions and MIPs were obtained to evaluate the vascular anatomy. Carotid stenosis measurements (when applicable) are obtained  utilizing NASCET criteria, using the distal internal carotid diameter as the denominator. CONTRAST:  50 mL Isovue 370 COMPARISON:  Brain MRI 0133 hours today, and earlier. FINDINGS: CTA NECK Skeleton: Previous cervical C3-C4 absent dentition. No acute osseous abnormality identified. Visualized paranasal sinuses and mastoids are stable and well pneumatized. And C4-C5 ACDF. Previous median sternotomy. Upper chest: Small layering bilateral pleural effusions, that on the left also tracks into the major fissure. Septal thickening and mosaic attenuation in the bilateral lung parenchyma. Right peritracheal lymphadenopathy with individual nodes measuring 16 mm. Anterior carina nodes measure up to 13 mm. Sequelae of CABG. Other neck: Partially calcified 16 mm left thyroid hypodense nodule which meets consensus criteria for ultrasound follow-up. Negative larynx, pharynx, parapharyngeal spaces, retropharyngeal space, sublingual space, submandibular glands and parotid glands. No cervical lymphadenopathy. Aortic arch: Calcified aortic atherosclerosis. Three vessel arch configuration. Soft arch atherosclerosis as well. Right carotid system: Right CCA soft and calcified plaque resulting in stenosis of less than 50 % with respect to the distal vessel. Soft plaque continues in the posterior right CCA. There is soft plaque with less than  50% stenosis just proximal to the right carotid bifurcation. At the bifurcation there is soft and calcified plaque. No right ICA origin or bulb stenosis. There is soft and calcified plaque at the right skullbase resulting in less than 50 % stenosis with respect to the distal vessel. Left carotid system: No left CCA origin stenosis. Soft plaque intermittently in the left CCA proximal to the bifurcation. Soft and calcified plaque at the bifurcation resulting in stenosis in the bulb up to 55-60 % with respect to the distal vessel. See series 401, image 87. There is soft plaque in the left ICA at the skullbase resulting in. Less than 50 % stenosis with respect to the distal vessel Vertebral arteries: No proximal right subclavian artery stenosis despite soft and calcified plaque. 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 There is a calcification at the origin. The right vertebral artery is occluded up to the C2-C3 level where it appears to be mildly reconstituted from muscular branches, but is highly diminutive and terminates outside the skull. Soft plaque in the proximal left subclavian artery with less than 50 % stenosis with respect to the distal vessel. Soft and calcified plaque at the left vertebral artery origin with severe stenosis (series 405, image 121). Tortuous left V1 segment. Minimal plaque in the left V2 segment. Mild left V3 segment plaque without significant stenosis. CTA HEAD Posterior circulation: Patent distal left vertebral artery which appears somewhat dominant has no stenosis to the basilar. There is mild left V4 segment plaque. Both a ICAs appear dominant. The distal right vertebral artery is occluded. There is moderate basilar artery irregularity and mild stenosis of the basilar just distal to the AICA origins. The basilar remains patent. The SCA and left PCA origins are normal. The left posterior communicating artery is present. The left PCA is occluded in the distal P2 segment with no  reconstituted flow. The right P1 is occluded but the right posterior communicating artery is patent although moderately to severely irregular and stenotic. Still, the right PCA branches are patent but attenuated. Anterior circulation: The right ICA siphon is patent but there is radiographic string sign stenosis of the siphon in the vertical petrous segment (series 402, image 249 and at least moderate superimposed stenosis in the cavernous and supraclinoid segments. Still, the right ICA terminus is patent. The right posterior communicating and right ophthalmic artery origins are normal. The  left ICA siphon is patent also with moderate to severe atherosclerosis. There is moderate stenosis in the left cavernous segment and severe stenosis in the left supraclinoid ICA related to confluent calcified plaque. In fat, there might be a short segment occlusion of the supraclinoid ICA segment, with the left ICA terminus supplied from the left posterior communicating artery. Nevertheless, both carotid termini are patent. The left MCA and ACA origins are within normal limits. The right ACA A1 segment is diminutive or absent. The anterior communicating artery and bilateral ACA branches are within normal limits. The right MCA origin is mildly irregular and stenotic. The right M1 remains patent. The right MCA bifurcation is patent. There is up to moderate stenosis at the origin of the anterior right MCA M2 branch. Other visualized right MCA branches are within normal limits. The left MCA proximal M1 segment is mildly irregular and stenotic. There is moderate irregularity and stenosis at the left MCA bifurcation. The left M2 branches remain patent, and are within normal limits. Venous sinuses: Negative. Anatomic variants: Dominant left ACA A1 segment. Delayed phase: Left PCA infarct remain subtle by CT. No abnormal enhancement identified. Review of the MIP images confirms the above findings IMPRESSION: 1. Left PCA occlusion in the  distal P2 segment, corresponding to the acute left PCA infarct. 2. Right Vertebral Artery occlusion (short segment reconstitution from muscular branches in the distal V2 segment). 3. 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 4. Right PCA P1 segment occlusion and poor reconstitution of the right PCA branches from the right posterior communicating artery which is highly atherosclerotic and stenotic. 5. Radiographic string sign stenosis of the Right ICA siphon in the distal petrous segment. At least moderate tandem stenoses from dense calcified plaque in the right cavernous and supraclinoid ICA segments, but the right ICA terminus remains patent. 6. High-grade stenosis versus short segment occlusion of the Left ICA supraclinoid segment. The left ICA terminus is patent but might be supplied from the left posterior communicating artery. 7. Mild to moderate bilateral MCA irregularity and stenosis, including moderate stenosis at the left MCA bifurcation. 8. Left greater than right carotid bifurcation atherosclerosis. Up to 60% left ICA bulb stenosis. 9. Bilateral pleural effusions with pulmonary ground-glass opacity and reactive appearing superior mediastinal lymphadenopathy. This constellation might reflect congestive heart failure with pulmonary edema. 10. Stable CT appearance of the brain. Electronically Signed: By: Genevie Ann M.D. On: 09/19/2016 13:44   Mr Brain Wo Contrast  Result Date: 09/19/2016 CLINICAL DATA:  75 y/o M; confusion that started with awakening this morning and a few days of left-sided arm tremors. EXAM: MRI HEAD WITHOUT CONTRAST TECHNIQUE: Multiplanar, multiecho pulse sequences of the brain and surrounding structures were obtained without intravenous contrast. COMPARISON:  09/19/2016 CT head.  05/15/2013 MRI brain. FINDINGS: Brain: Diffusion restriction within the left posterior cerebral artery distribution  involving the posterior hippocampus and occipital lobe. Background of moderate chronic microvascular ischemic changes and parenchymal volume loss. 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 No abnormal susceptibility hypointensity to indicate intracranial hemorrhage. Vascular: Irregularity of proximal flow voids is probably related intracranial atherosclerosis. Skull and upper cervical spine: Partially visualized susceptibility artifact from anterior cervical discectomy and fusion of the upper cervical spine. Otherwise normal bone marrow signal. Sinuses/Orbits: No abnormal signal of paranasal sinuses. Right mastoid effusion. No abnormal signal of left mastoid. Orbits are unremarkable. Other: None. IMPRESSION: 1. Acute  infarct within the left posterior cerebral artery distribution. No hemorrhage identified. 2. Background of moderate chronic microvascular ischemic changes and parenchymal volume loss. 3. 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 These results were called by telephone at the time of interpretation on 09/19/2016 at 2:21 am to Dr. Carmin Muskrat , who verbally acknowledged these results. Electronically Signed   By: Kristine Garbe M.D.   On: 09/19/2016 02:22    Echocardiogram: 02/2016 Study Conclusions  - Left ventricle: The cavity size was normal. Wall thickness was   normal. Systolic function was severely reduced. The estimated   ejection fraction was in the range of 25% to 30%. Akinesis of the   inferior myocardium. - Aortic valve: There was moderate to severe stenosis. Valve area   (VTI): 0.66 cm^2. Valve area (Vmax): 0.62 cm^2. Valve area   (Vmean): 0.58 cm^2. - Left atrium: The atrium was mildly dilated. - Pulmonary arteries: Systolic pressure was moderately increased.   PA peak pressure: 54 mm Hg (S).  Assessment & Plan    1. Elevated Troponin - Peak of troponin  1.73--> trending down. EKG shows ST depression in inferolateral leads, similar to previous tracings.  - thought to be secondary to demand ischemia in the setting of acute CVA and Stage 3 CKD. - Pending repeat pending. Medical management.   2. 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 Cath in 05/2016 showing patent LIMA-LAD, SVG-OM2-OM3, SVG-distal RCA and occluded SVG-RI. - EF 25-30% by echo in 02/2016. not a candidate for ACE-I/ARB/ Arlyce Harman secondary to CKD. Lowe dose coreg, BP stable--> Consider up titration as bp allows.  - continue ASA, Plavix, and Zetia.  3. Severe AS - cath in 05/2016 showed severe calcific aortic valve stenosis with a peak to peak gradient of 52 mm Hg, mean gradient 52.9 mm Hg, and a calculated aortic valve area of 0.67 cm.  - consider TAVR eval following recovery of acute CVA. - repeat echo is pending as above.  4. HLD - 09/19/2016: Cholesterol 153; HDL 20; LDL Cholesterol 103; Triglycerides 150; VLDL 30 - intolerant to statin therapy. On Zetia. Referral to [facility] for possible initiation of PCSK9 as an outpatient.   5. Acute CVA - MRI showing acute infarct within the left posterior cerebral artery distribution. - Neurology following.  SignedLeanor Kail , PA-C 9:21 AM 09/21/2016  Patient seen and examined. Agree with assessment and plan. Feels well without chest pain or overt dyspnea. According to daughter ? seizure activity during the night.  Currently with resting tachycardia. Will try adding carvedilol at 3.125 mg bid with LV dysfunction/AS/CAD.  Cr improving to 1.15; may be able to tolerate low dose ARB or ACE-I, but will not yet start.   Troy Sine, MD, [facility] 09/21/2016 10:33 AM

## 2016-09-21 NOTE — Progress Notes (Signed)
Physical Therapy Treatment Patient Details Name: Anthony Holder MRN: [id number] DOB: Sep 13, 1941 Today's Date: 09/21/2016    History of Present Illness Anthony Holder is a 75 y.o. male with a history of CAD s/p CABG, severe AS with pulmonary HTN, COPD, DM, PVD followed by [name], CKD stage III, HTN, HLD,  carotid artery disease s/p R CEA who presented with confusion.  Positive for L PCA infarct.    PT Comments    Patient demonstrated elevated HR and activity intolerance with c/o weakness and needing to sit in hallway.  Educated pt and spouse to use w/c when needed, take rest breaks, etc due to issues of AS and A-fib.  Will continue skilled PT in acute setting in anticipation of d/c home with wife assist and follow up HHPT.   Follow Up Recommendations  Home health PT     Equipment Recommendations  None recommended by PT    Recommendations for Other Services       Precautions / Restrictions Precautions Precautions: Fall Precaution Comments: L prosthesis, R heel wedge darco    Mobility  Bed Mobility Overal bed mobility: Needs Assistance Bed Mobility: Supine to Sit     Supine to sit: Min assist     General bed mobility comments: pt pulled up on my hand  Transfers Overall transfer level: Needs assistance Equipment used: Rolling walker (2 wheeled) Transfers: Sit to/from Stand Sit to Stand: Mod assist         General transfer comment: lifting help from EOB  Ambulation/Gait Ambulation/Gait assistance: Min assist;Mod assist Ambulation Distance (Feet): 130 Feet Assistive device: Rolling walker (2 wheeled)       General Gait Details: ran into w/c on R side in hallway, assist to maneuver around, pt reported feeling weak after walking a bit then stated needed to sit, RN obtained w/c and pt wheeled to room. HR max noted at 133, back down to 98 then assist back to EOB with family and MD in room.   Stairs            Wheelchair Mobility    Modified Rankin  (Stroke Patients Only) Modified Rankin (Stroke Patients Only) Pre-Morbid Rankin Score: Moderate disability Modified Rankin: Moderately severe disability     Balance Overall balance assessment: Needs assistance   Sitting balance-Leahy Scale: Good     Standing balance support: Bilateral upper extremity supported Standing balance-Leahy Scale: Poor Standing balance comment: leaning back when backing up to bed with walker, multimodal cues for balance/safety                    Cognition Arousal/Alertness: Awake/alert Behavior During Therapy: WFL for tasks assessed/performed Overall Cognitive Status: Impaired/Different from baseline Area of Impairment: Orientation;Memory;Following commands;Problem solving Orientation Level: Place;Time;Situation   Memory: Decreased short-term memory Following Commands: Follows one step commands with increased time     Problem Solving: Slow processing;Decreased initiation;Requires verbal cues;Requires tactile cues General Comments: starting to ask when he had a stroke and noted that his vision has been affected    Exercises      General Comments General comments (skin integrity, edema, etc.): Educated pt/family in need for rest breaks, likely w/c use for long distances due to h/o AS and with some increased HR during activity this session.       Pertinent Vitals/Pain Pain Assessment: Faces Faces Pain Scale: Hurts a little bit Pain Location: R foot at area of amputation Pain Descriptors / Indicators: Aching Pain Intervention(s): Monitored during session;Repositioned    Home Living  Available Help at Discharge: Family;Available 24 hours/day Type of Home: House              Prior Function            PT Goals (current goals can now be found in the care plan section) Progress towards PT goals: Progressing toward goals    Frequency    Min 4X/week      PT Plan Current plan remains appropriate    Co-evaluation              End of Session Equipment Utilized During Treatment: Gait belt Activity Tolerance: Treatment limited secondary to medical complications (Comment) Patient left: in bed;with call bell/phone within reach;with family/visitor present     Time: [id number] PT Time Calculation (min) (ACUTE ONLY): 19 min  Charges:  $Gait Training: 8-22 mins                    G Codes:      Reginia Naas Oct 16, 2016, 5:01 PM  Magda Kiel, [place] 2016-10-16
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
## 2016-09-21 NOTE — Progress Notes (Signed)
STROKE TEAM PROGRESS NOTE   SUBJECTIVE (INTERVAL HISTORY) His wife and granddaughter are at the bedside. Granddaughter reported last night 3-4 episodes of LUE drawing back to chest, stiff, with neck stiffness too, not able to wake up, lasting 2-3 min. At least urinary incontinence once and this morning confused on awakening, similar to his presenting symptoms PTA. Wife and family also complains of frequent staring spells, but seemed still responding to questions during the staring.    OBJECTIVE Temp:  [97.8 F (36.6 C)-98.3 F (36.8 C)] 98.3 F (36.8 C) (10/03 1421) Pulse Rate:  [99-113] 113 (10/03 1421) Cardiac Rhythm: Sinus tachycardia (10/03 0726) Resp:  [10-18] 15 (10/03 1421) BP: (101-139)/(53-86) 127/75 (10/03 1421) SpO2:  [92 %-97 %] 94 % (10/03 1421) Weight:  [180 lb (81.6 kg)] 180 lb (81.6 kg) (10/03 0521)  CBC:   Recent Labs Lab 09/20/16 0044 09/21/16 0458  WBC 10.7* 9.5  NEUTROABS 7.0 6.2  HGB 10.0* 9.9*  HCT 32.2* 32.0*  MCV 88.2 87.4  PLT 251 0000000    Basic Metabolic Panel:   Recent Labs Lab 09/19/16 0847 09/20/16 0044 09/21/16 0458  NA 135 136 136  K 4.2 3.9 3.5  CL 101 103 107  CO2 24 23 24   GLUCOSE 289* 125* 105*  BUN 22* 22* 16  CREATININE 1.27* 1.32* 1.15  CALCIUM 9.6 9.4 9.2  MG 1.8  --   --   PHOS 3.0  --   --     Lipid Panel:     Component Value Date/Time   CHOL 153 09/19/2016 0425   TRIG 150 (H) 09/19/2016 0425   HDL 20 (L) 09/19/2016 0425   CHOLHDL 7.7 09/19/2016 0425   VLDL 30 09/19/2016 0425   LDLCALC 103 (H) 09/19/2016 0425   HgbA1c:  Lab Results  Component Value Date   HGBA1C 6.9 (H) 09/19/2016   Urine Drug Screen:     Component Value Date/Time   LABOPIA NONE DETECTED 09/18/2016 0015   COCAINSCRNUR NONE DETECTED 09/18/2016 0015   LABBENZ POSITIVE (A) 09/18/2016 0015   AMPHETMU NONE DETECTED 09/18/2016 0015   THCU NONE DETECTED 09/18/2016 0015   LABBARB NONE DETECTED 09/18/2016 0015      IMAGING I have personally  reviewed the radiological images below and agree with the radiology interpretations.  Dg Chest 2 View 09/18/2016 Small left pleural effusion with basilar atelectasis.   Ct Head Wo Contrast 09/19/2016 No acute intracranial abnormalities. Chronic atrophy and small vessel ischemic changes.   Mr Brain Wo Contrast 09/19/2016 1. Acute infarct within the left posterior cerebral artery distribution. No hemorrhage identified.  2. Background of moderate chronic microvascular ischemic changes and parenchymal volume loss.  3. 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   CTA head and neck  1. Left PCA occlusion in the distal P2 segment, corresponding to the acute left PCA infarct. 2. Right Vertebral Artery occlusion (short segment reconstitution from muscular branches in the distal V2 segment). 3. 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 4. Right PCA P1 segment occlusion and poor reconstitution of the right PCA branches from the right posterior communicating artery which is highly atherosclerotic and stenotic. 5. Radiographic string sign stenosis of the Right ICA siphon in the distal petrous segment. At least moderate tandem stenoses from dense calcified plaque in the right cavernous and supraclinoid ICA segments, but the right ICA terminus remains patent. 6. High-grade stenosis  versus short segment occlusion of the Left ICA supraclinoid segment. The left ICA terminus is patent but might be supplied from the left posterior communicating artery. 7. Mild to moderate bilateral MCA irregularity and stenosis, including moderate stenosis at the left MCA bifurcation. 8. Left greater than right carotid bifurcation atherosclerosis. Up to 60% left ICA bulb stenosis. 9. Bilateral pleural effusions with pulmonary ground-glass opacity and reactive appearing superior  mediastinal lymphadenopathy. This constellation might reflect congestive heart failure with pulmonary edema. 10. Stable CT appearance of the brain.  LE venous doppler  No obvious evidence of deep vein thrombosis involving the visualized veins of the bilateral lower extremities.  EEG  This awake and asleep EEG is abnormal due to mild diffuse slowing of the waking background. Clinical Correlation of the above findings indicates diffuse cerebral dysfunction that is non-specific in etiology and can be seen with hypoxic/ischemic injury, toxic/metabolic encephalopathies, neurodegenerative disorders, or medication effect.  The absence of epileptiform discharges does not rule out a clinical diagnosis of epilepsy.  Clinical correlation is advised.  TTE pending  Overnight LTM EEG pending   PHYSICAL EXAM  Temp:  [97.8 F (36.6 C)-98.3 F (36.8 C)] 98.3 F (36.8 C) (10/03 1421) Pulse Rate:  [99-113] 113 (10/03 1421) Resp:  [10-18] 15 (10/03 1421) BP: (101-139)/(53-86) 127/75 (10/03 1421) SpO2:  [92 %-97 %] 94 % (10/03 1421) Weight:  [180 lb (81.6 kg)] 180 lb (81.6 kg) (10/03 0521)  General - Well nourished, well developed, in no apparent distress.  Ophthalmologic - Fundi not visualized due to noncooperation.  Cardiovascular - Regular rate and rhythm.  Mental Status -  Level of arousal and orientation to time, place, and person were intact. Language including expression, naming, repetition, comprehension was assessed and found intact, paucity of speech with mild dysarthria due to lack of teeth. Psychomotor slowing.  Cranial Nerves II - XII - II - right upper quadrantanopia. III, IV, VI - Extraocular movements intact. V - Facial sensation intact bilaterally. VII - Facial movement intact bilaterally. VIII - Hearing & vestibular intact bilaterally. X - Palate elevates symmetrically. XI - Chin turning & shoulder shrug intact bilaterally. XII - Tongue protrusion intact.  Motor Strength  - The patient's strength was normal in all extremities except LLE BKA and R toes amputation with dressing and pronator drift was absent.  Bulk was normal and fasciculations were absent.   Motor Tone - Muscle tone was assessed at the neck and appendages and was normal.  Reflexes - The patient's reflexes were 1+ in all extremities and he had no pathological reflexes.  Sensory - Light touch, temperature/pinprick were assessed and were symmetrical.    Coordination - The patient had normal movements in the hands with no ataxia or dysmetria, slow in action.  Tremor was absent.  Gait and Station - not tested due to amputations    ASSESSMENT/PLAN [name] is a 75 y.o. male with history of DM, HTN, severe AS, CAD s/p CABG, HLD, left leg BKA, s/p right CEA, CHF with EF 30%, right thalamic stroke in 2013 presenting with confusion and decreased speech output. He did not receive IV t-PA due to out of window.   Stroke:  left PCA infarct, likely due to large vessel atherosclerosis given pt extensive hx of vasculopathy. However, cardioembolic can not be ruled out due to low EF.   Resultant  Right upper quadrantanopia  MRI  Left PCA infarct, old right thalamic infarct  CTA head and neck - left P2 occlusion. Multifocal severe  intracranial vessel stenosis, diffuse athero.  2D Echo  pending  LE venous doppler negative for DVT  LDL 103  HgbA1c 6.9  lovenox for VTE prophylaxis Diet heart healthy/carb modified Room service appropriate? Yes; Fluid consistency: Thin  No antithrombotic prior to admission, now on aspirin 325 mg daily. Recommend DAPT for 3 motnhs and then plavix alone.   Patient counseled to be compliant with his antithrombotic medications  Ongoing aggressive stroke risk factor management  Therapy recommendations:  pending  Disposition:  Pending  Seizure like activity  Wife reports staring spells PTA but seems still responding to questions during the  staring  Granddaughter reported tonic activity UEs last night, 3-4 episodes with urinary incontinence and morning confusion  Routine EEG diffuse slow but no seizure  Will do overnight EEG LTM monitoring  Treatment plan depending on EEG findings.  Cardiomyopathy  Low EF 25-30% in 02/2016  Likely due to severe CAD s/p CABG and stenting  2D pending   cardioembolic etiology for stroke still in DDx though  Cardiology on board  Hypertension  Stable Permissive hypertension (OK if < 180/105) but gradually normalize in 5-7 days Long-term BP goal normotensive  Hyperlipidemia  Home meds:  zetia, resumed in hospital  LDL 103, goal < 70  On Zetia, continue on discharge  Pt has statin allergy  Diabetes  HgbA1c 6.9, goal < 7.0  Controlled  fluctuating CBG  On home meds - NPH  SSI  Close outpt follow up for better DM control  Other Stroke Risk Factors  Advanced age  Remote Cigarette smoking hx  Hx stroke/TIA - 2013 right thalamic infarct  Coronary artery disease s/p CABG  Severe AS, pending intervention  S/p right CEA  Left BKA  right toes amputation with unhealing wound   Other Active Problems  Elevated troponin  Leukocytosis  Elevated Cre  Hospital day # 2   Rosalin Hawking, MD PhD Stroke Neurology 09/21/2016 4:15 PM    To contact Stroke Continuity provider, please refer to [email or web address] After hours, contact General Neurology

## 2016-09-22 LAB — BASIC METABOLIC PANEL
Anion gap: 7 (ref 5–15)
BUN: 14 mg/dL (ref 6–20)
CALCIUM: 9 mg/dL (ref 8.9–10.3)
CO2: 25 mmol/L (ref 22–32)
CREATININE: 1.21 mg/dL (ref 0.61–1.24)
Chloride: 106 mmol/L (ref 101–111)
GFR calc Af Amer: >60 mL/min (ref >60)
GFR, EST NON AFRICAN AMERICAN: 57 mL/min — AB (ref >60)
GLUCOSE: 134 mg/dL — AB (ref 65–99)
Potassium: 3.8 mmol/L (ref 3.5–5.1)
Sodium: 138 mmol/L (ref 135–145)

## 2016-09-22 LAB — GLUCOSE, CAPILLARY
GLUCOSE-CAPILLARY: 251 mg/dL — AB (ref 65–99)
Glucose-Capillary: 143 mg/dL — ABNORMAL HIGH (ref 65–99)
Glucose-Capillary: 232 mg/dL — ABNORMAL HIGH (ref 65–99)
Glucose-Capillary: 328 mg/dL — ABNORMAL HIGH (ref 65–99)

## 2016-09-22 MED ORDER — GUAIFENESIN-DM 100-10 MG/5ML PO SYRP
5.0000 mL | ORAL_SOLUTION | ORAL | Status: DC | PRN
  Administered 2016-09-22: 5 mL via ORAL
  Filled 2016-09-22: qty 5

## 2016-09-22 NOTE — Progress Notes (Signed)
vLTM EEG complete. Pt had un blanchable redness at electrode site F3.

## 2016-09-22 NOTE — Progress Notes (Signed)
STROKE TEAM PROGRESS NOTE   SUBJECTIVE (INTERVAL HISTORY) His wife is at the bedside. She reports no further seizure like episodes overnight. Bed side continuous EEG recording is ongoing OBJECTIVE Temp:  [98.1 F (36.7 C)-98.3 F (36.8 C)] 98.3 F (36.8 C) (10/04 1108) Pulse Rate:  [103-117] 109 (10/04 0508) Cardiac Rhythm: Sinus tachycardia (10/04 0724) Resp:  [15-23] 23 (10/04 0508) BP: (97-127)/(63-79) 116/74 (10/04 0508) SpO2:  [93 %-97 %] 94 % (10/04 0508) Weight:  [180 lb 12.8 oz (82 kg)] 180 lb 12.8 oz (82 kg) (10/04 0508)  CBC:   Recent Labs Lab 09/20/16 0044 09/21/16 0458  WBC 10.7* 9.5  NEUTROABS 7.0 6.2  HGB 10.0* 9.9*  HCT 32.2* 32.0*  MCV 88.2 87.4  PLT 251 0000000    Basic Metabolic Panel:   Recent Labs Lab 09/19/16 0847  09/21/16 0458 09/22/16 0344  NA 135  < > 136 138  K 4.2  < > 3.5 3.8  CL 101  < > 107 106  CO2 24  < > 24 25  GLUCOSE 289*  < > 105* 134*  BUN 22*  < > 16 14  CREATININE 1.27*  < > 1.15 1.21  CALCIUM 9.6  < > 9.2 9.0  MG 1.8  --   --   --   PHOS 3.0  --   --   --   < > = values in this interval not displayed.  Lipid Panel:     Component Value Date/Time   CHOL 153 09/19/2016 0425   TRIG 150 (H) 09/19/2016 0425   HDL 20 (L) 09/19/2016 0425   CHOLHDL 7.7 09/19/2016 0425   VLDL 30 09/19/2016 0425   LDLCALC 103 (H) 09/19/2016 0425   HgbA1c:  Lab Results  Component Value Date   HGBA1C 6.9 (H) 09/19/2016   Urine Drug Screen:     Component Value Date/Time   LABOPIA NONE DETECTED 09/18/2016 0015   COCAINSCRNUR NONE DETECTED 09/18/2016 0015   LABBENZ POSITIVE (A) 09/18/2016 0015   AMPHETMU NONE DETECTED 09/18/2016 0015   THCU NONE DETECTED 09/18/2016 0015   LABBARB NONE DETECTED 09/18/2016 0015      IMAGING I have personally reviewed the radiological images below and agree with the radiology interpretations.  Dg Chest 2 View 09/18/2016 Small left pleural effusion with basilar atelectasis.   Ct Head Wo  Contrast 09/19/2016 No acute intracranial abnormalities. Chronic atrophy and small vessel ischemic changes.   Mr Brain Wo Contrast 09/19/2016 1. Acute infarct within the left posterior cerebral artery distribution. No hemorrhage identified.  2. Background of moderate chronic microvascular ischemic changes and parenchymal volume loss.  3. 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   CTA head and neck  1. Left PCA occlusion in the distal P2 segment, corresponding to the acute left PCA infarct. 2. Right Vertebral Artery occlusion (short segment reconstitution from muscular branches in the distal V2 segment). 3. 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 4. Right PCA P1 segment occlusion and poor reconstitution of the right PCA branches from the right posterior communicating artery which is highly atherosclerotic and stenotic. 5. Radiographic string sign stenosis of the Right ICA siphon in the distal petrous segment. At least moderate tandem stenoses from dense calcified plaque in the right cavernous and supraclinoid ICA segments, but the right ICA terminus remains patent. 6. High-grade stenosis versus short segment occlusion of the Left  ICA supraclinoid segment. The left ICA terminus is patent but might be supplied from the left posterior communicating artery. 7. Mild to moderate bilateral MCA irregularity and stenosis, including moderate stenosis at the left MCA bifurcation. 8. Left greater than right carotid bifurcation atherosclerosis. Up to 60% left ICA bulb stenosis. 9. Bilateral pleural effusions with pulmonary ground-glass opacity and reactive appearing superior mediastinal lymphadenopathy. This constellation might reflect congestive heart failure with pulmonary edema. 10. Stable CT appearance of the brain.  LE venous doppler  No obvious  evidence of deep vein thrombosis involving the visualized veins of the bilateral lower extremities.  EEG  This awake and asleep EEG is abnormal due to mild diffuse slowing of the waking background. Clinical Correlation of the above findings indicates diffuse cerebral dysfunction that is non-specific in etiology and can be seen with hypoxic/ischemic injury, toxic/metabolic encephalopathies, neurodegenerative disorders, or medication effect.  The absence of epileptiform discharges does not rule out a clinical diagnosis of epilepsy.  Clinical correlation is advised.  TTE Left ventricle: The cavity size was normal. Wall thickness was   normal. Systolic function was severely reduced. The estimated   ejection fraction was in the range of 20% to 25%. Diffuse   hypokinesis. - Ventricular septum: Septal motion showed abnormal function and   dyssynergy. The contour showed diastolic flattening. - Aortic valve: Valve mobility was restricted. There was severe   stenosis. Peak velocity (S): 448 cm/s. Mean velocity (S): 49   cm/s.  Overnight LTM EEG pending   PHYSICAL EXAM  Temp:  [98.1 F (36.7 C)-98.3 F (36.8 C)] 98.3 F (36.8 C) (10/04 1108) Pulse Rate:  [103-117] 109 (10/04 0508) Resp:  [15-23] 23 (10/04 0508) BP: (97-127)/(63-79) 116/74 (10/04 0508) SpO2:  [93 %-97 %] 94 % (10/04 0508) Weight:  [180 lb 12.8 oz (82 kg)] 180 lb 12.8 oz (82 kg) (10/04 0508)  General - Well nourished, well developed, in no apparent distress.  Ophthalmologic - Fundi not visualized due to noncooperation.  Cardiovascular - Regular rate and rhythm.  Mental Status -  Level of arousal and orientation to time, place, and person were intact. Language including expression, naming, repetition, comprehension was assessed and found intact, paucity of speech with mild dysarthria due to lack of teeth. Psychomotor slowing.  Cranial Nerves II - XII - II - right upper quadrantanopia. III, IV, VI - Extraocular movements  intact. V - Facial sensation intact bilaterally. VII - Facial movement intact bilaterally. VIII - Hearing & vestibular intact bilaterally. X - Palate elevates symmetrically. XI - Chin turning & shoulder shrug intact bilaterally. XII - Tongue protrusion intact.  Motor Strength - The patient's strength was normal in all extremities except LLE BKA and R toes amputation with dressing and pronator drift was absent.  Bulk was normal and fasciculations were absent.   Motor Tone - Muscle tone was assessed at the neck and appendages and was normal.  Reflexes - The patient's reflexes were 1+ in all extremities and he had no pathological reflexes.  Sensory - Light touch, temperature/pinprick were assessed and were symmetrical.    Coordination - The patient had normal movements in the hands with no ataxia or dysmetria, slow in action.  Tremor was absent.  Gait and Station - not tested due to amputations    ASSESSMENT/PLAN Mr. Susan Hoole is a 75 y.o. male with history of DM, HTN, severe AS, CAD s/p CABG, HLD, left leg BKA, s/p right CEA, CHF with EF 30%, right thalamic stroke in 2013 presenting with  confusion and decreased speech output. He did not receive IV t-PA due to out of window.   Stroke:  left PCA infarct, likely due to large vessel atherosclerosis given pt extensive hx of vasculopathy. However, cardioembolic can not be ruled out due to low EF.   Resultant  Right upper quadrantanopia  MRI  Left PCA infarct, old right thalamic infarct  CTA head and neck - left P2 occlusion. Multifocal severe intracranial vessel stenosis, diffuse athero.  2D Echo  pending  LE venous doppler negative for DVT  LDL 103  HgbA1c 6.9  lovenox for VTE prophylaxis Diet heart healthy/carb modified Room service appropriate? Yes; Fluid consistency: Thin  No antithrombotic prior to admission, now on aspirin 325 mg daily. Recommend DAPT for 3 motnhs and then plavix alone.   Patient counseled to be  compliant with his antithrombotic medications  Ongoing aggressive stroke risk factor management  Therapy recommendations:  None  Disposition:  home  Seizure like activity  Wife reports staring spells PTA but seems still responding to questions during the staring  Granddaughter reported tonic activity UEs last night, 3-4 episodes with urinary incontinence and morning confusion  Routine EEG diffuse slow but no seizure  Will do overnight EEG LTM monitoring  Treatment plan depending on EEG findings.  Cardiomyopathy  Low EF 25-30% in 02/2016  Likely due to severe CAD s/p CABG and stenting  2D EF 20-25% sever AS   Cardiology on board  Hypertension  Stable Permissive hypertension (OK if < 180/105) but gradually normalize in 5-7 days Long-term BP goal normotensive  Hyperlipidemia  Home meds:  zetia, resumed in hospital  LDL 103, goal < 70  On Zetia, continue on discharge  Pt has statin allergy  Diabetes  HgbA1c 6.9, goal < 7.0  Controlled  fluctuating CBG  On home meds - NPH  SSI  Close outpt follow up for better DM control  Other Stroke Risk Factors  Advanced age  Remote Cigarette smoking hx  Hx stroke/TIA - 2013 right thalamic infarct  Coronary artery disease s/p CABG  Severe AS, pending intervention  S/p right CEA  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 Continue dual antiplatelet therapy for stroke. D/w wife and answered questions. 50% of time during this 25 minute visit was spent on counseling and coordination of care about his stroke, possible seizures and answered questions  Antony Contras, MD Stroke Neurology 09/22/2016 12:01 PM    To contact Stroke Continuity provider, please refer to [email or web address] After hours, contact General Neurology

## 2016-09-22 NOTE — Progress Notes (Signed)
OT Cancellation Note  Patient Details Name: Anthony Holder MRN: [id number] DOB: 1941-05-12   Cancelled Treatment:    Reason Eval/Treat Not Completed: Patient at procedure or test/ unavailable (EEG). Will follow.  Malka So 09/22/2016, 9:05 AM

## 2016-09-22 NOTE — Progress Notes (Signed)
Occupational Therapy Treatment Patient Details Name: Anthony Holder MRN: [id number] DOB: [birth year] Today's Date: 09/22/2016    History of present illness [name] is a 75 y.o. male with a history of CAD s/p CABG, severe AS with pulmonary HTN, COPD, DM, PVD followed by [name], CKD stage III, HTN, HLD,  carotid artery disease s/p R CEA who presented with confusion.  Positive for L PCA infarct.   OT comments  Wife demonstrating ability to manage pt and continues to want to take pt home. HR increased with minimal exertion to 122 with pt reporting fatigue with transfer to chair.   Follow Up Recommendations  Home health OT;Supervision/Assistance - 24 hour    Equipment Recommendations  3 in 1 bedside comode    Recommendations for Other Services      Precautions / Restrictions Precautions Precautions: Fall Precaution Comments: L prosthesis, R heel wedge darco Restrictions Weight Bearing Restrictions: No       Mobility Bed Mobility Overal bed mobility: Needs Assistance Bed Mobility: Supine to Sit     Supine to sit: Min guard     General bed mobility comments: increased time, use of rail  Transfers Overall transfer level: Needs assistance Equipment used: Rolling walker (2 wheeled) Transfers: Sit to/from [facility] Sit to Stand: Min assist Stand pivot transfers: Min assist       General transfer comment: assist to rise from EOB, cues for hand placement    Balance Overall balance assessment: Needs assistance   Sitting balance-Leahy Scale: Poor Sitting balance - Comments: leaning posterior with donning L prosthesis with cues, min for safety     Standing balance-Leahy Scale: Poor                     ADL Overall ADL's : Needs assistance/impaired     Grooming: Wash/dry hands;Wash/dry face;Sitting;Set up       Lower Body Bathing: Maximal assistance;Sit to/from stand       Lower Body Dressing: Maximal assistance;Sitting/lateral  leans;Sit to/from stand Lower Body Dressing Details (indicate cue type and reason): wife gave total assist to don prosthesis and post op shoe,pt with posterior lean as prosthesis donned Toilet Transfer: Stand-pivot;Minimal assistance;BSC   Toileting- Clothing Manipulation and Hygiene: Minimal assistance         General ADL Comments: Educated pt and wife in compensatory strategies for vision deficit.      Vision                     Perception     Praxis      Cognition   Behavior During Therapy: [facility] for tasks assessed/performed Overall Cognitive Status: Impaired/Different from baseline Area of Impairment: Orientation;Memory;Following commands;Problem solving     Memory: Decreased short-term memory  Following Commands: Follows one step commands with increased time     Problem Solving: Slow processing;Decreased initiation;Requires verbal cues;Requires tactile cues General Comments: Pt repeating himself throughtout session, "I was doing well until I had this stroke."    Extremity/Trunk Assessment               Exercises     Shoulder Instructions       General Comments      Pertinent Vitals/ Pain       Pain Assessment: No/denies pain  Home Living  Prior Functioning/Environment              Frequency  Min 2X/week        Progress Toward Goals  OT Goals(current goals can now be found in the care plan section)  Progress towards OT goals: Progressing toward goals  Acute Rehab OT Goals Patient Stated Goal: To go home Time For Goal Achievement: 09/28/16 Potential to Achieve Goals: Good  Plan Discharge plan remains appropriate    Co-evaluation                 End of Session Equipment Utilized During Treatment: Gait belt;Rolling walker (post op shoe)   Activity Tolerance Patient limited by fatigue (Pt with HR to 122 with bed mobility)   Patient Left in chair;with call  bell/phone within reach;with family/visitor present   Nurse Communication          Time: 1456-1530 OT Time Calculation (min): 34 min  Charges: OT General Charges $OT Visit: 1 Procedure OT Treatments $Self Care/Home Management : 23-37 mins  Malka So 09/22/2016, 3:43 PM  [phone]

## 2016-09-22 NOTE — Progress Notes (Signed)
PROGRESS NOTE  Anthony Holder [id number] DOB: [birth year] DOA: 09/18/2016 PCP: [name], MD  HPI/Recap of past 62 hours: 75 year old past CAD status post CABG, CHF with EF of 25%, severe aortic stenosis with pulmonary hypertension and COPD admitted on 9/30 for 1 day of confusion and found to have a left PCA infarct. Patient also noted to have some elevated troponin levels so cardiology consulted. In addition, patient*having episodes of staring off into space concerning for seizure-like activity. He underwent a overnight EEG with some questionable report of focal slowing. A previous EEG done during this hospitalization was felt to be nonspecific and related more to his global issues.  today, patient complains overall just feeling a little bit weak as well as some left heel pain.    Assessment/Plan: Principal Problem:   Acute Cerebral infarction due to unspecified occlusion or stenosis of left posterior cerebral artery ([place]): Confirmed by MRI. CT angiogram of head and neck noted left PCA occlusion and distal P2 segment. Transthoracic echocardiogram unrevealing for clot. Stroke felt to be secondary to large vessel atherosclerosis given extensive history of vasculopathy. However, given low EF, needs echocardiogram to rule out cardio embolic disease now on aspirin and Plavix therapy for 3 months and then Plavix alone. 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    Gait abnormality   Seizure-like activity ([place]): Staring  spells. Possibly some tonic activity in the previous night reported. Question of episodes of urinary incontinence morning confusion. Routine EEG noted diffuse slow wave but no actual seizure activity. Overnight EEG noted focal slowing in the left posterior quadrant. We'll await neurology follow-up   Code Status: Full code   Family Communication: Wife at the bedside   Disposition Plan: Based on findings from EEG, await follow-up from neurology. Potential discharge to skilled nursing facility tomorrow    Consultants:  Neurology  Cardiology   Procedures:  Echocardiogram: Severe aortic stenosis, decreased ejection fraction 20-25% Intracardiac emboli    lower extremity Dopplers done 10/1: No evidence of DVT  Antimicrobials:  None   DVT prophylaxis:  SCDs   Objective: Vitals:   09/22/16 0508 09/22/16 0800 09/22/16 1108 09/22/16 1603  BP: 116/74     Pulse: (!) 109     Resp: (!) 23     Temp: 98.2 F (36.8 C) 98.1 F (36.7 C) 98.3 F (36.8 C) 98.5 F (36.9 C)  TempSrc: Oral Oral Oral Oral  SpO2: 94%     Weight: 82 kg (180 lb 12.8 oz)     Height:        Intake/Output Summary (Last 24 hours) at 09/22/16 1815 Last data filed at 09/22/16 1300  Gross per 24 hour  Intake              440 ml  Output              650 ml  Net             -210 ml   Filed Weights   09/19/16 0455 09/21/16 0521 09/22/16 [id number]  Weight: 82.9 kg (182 lb 12.8 oz) 81.6 kg (180 lb) 82 kg (180 lb 12.8 oz)    Exam:   General:  Alert and oriented 2, no acute distress    Cardiovascular: Regular rhythm, 3/6 holosystolic murmur    Respiratory: Clear to Auscultation bilaterally    Abdomen: Soft, nontender, nondistended, positive bowel sounds    Musculoskeletal: Left lower extremity in boot    Skin: No skin breaks, tears or lesions   Psychiatry: Patient is appropriate, no evidence of psychoses    Data Reviewed: CBC:  Recent Labs Lab 09/18/16 2114 09/19/16 0847 09/20/16 0044  09/21/16 0458  WBC 14.6* 10.7* 10.7* 9.5  NEUTROABS 10.1* 7.2 7.0 6.2  HGB 11.0* 10.1* 10.0* 9.9*  HCT 35.1* 31.5* 32.2* 32.0*  MCV 88.6 87.5 88.2 87.4  PLT 302 298 251 0000000   Basic Metabolic Panel:  Recent Labs Lab 09/18/16 2113 09/19/16 0847 09/20/16 0044 09/21/16 0458 09/22/16 0344  NA 135 135 136 136 138  K 4.9 4.2 3.9 3.5 3.8  CL 101 101 103 107 106  CO2 21* 24 23 24 25   GLUCOSE 280* 289* 125* 105* 134*  BUN 24* 22* 22* 16 14  CREATININE 1.42* 1.27* 1.32* 1.15 1.21  CALCIUM 10.0 9.6 9.4 9.2 9.0  MG  --  1.8  --   --   --   PHOS  --  3.0  --   --   --    GFR: Estimated Creatinine Clearance: 55.3 mL/min (by C-G formula based on SCr of 1.21 mg/dL). Liver Function Tests:  Recent Labs Lab 09/18/16 2113 09/19/16 0847 09/20/16 0044 09/21/16 0458  AST 18 16 13* 13*  ALT 9* 8* 9* 7*  ALKPHOS 46 41 40 39  BILITOT 0.8 0.6 0.7 0.5  PROT 8.1 7.8 7.5 7.0  ALBUMIN 3.7 3.4* 3.2* 3.1*   No results for input(s): LIPASE, AMYLASE in the last 168 hours. No results for input(s): AMMONIA in the last 168 hours. Coagulation Profile: No results for input(s): INR, PROTIME in the last 168 hours. Cardiac Enzymes:  Recent Labs Lab 09/19/16 1319 09/19/16 1913 09/20/16 0044 09/20/16 0802 09/20/16 1307  TROPONINI 1.62* 1.44* 1.53* 1.32* 1.05*   BNP (last 3 results) No results for input(s): PROBNP in the last 8760 hours. HbA1C: No results for input(s): HGBA1C in the last 72 hours. CBG:  Recent Labs Lab 09/21/16 1620 09/21/16 2029 09/22/16 0747 09/22/16 1107 09/22/16 1603  GLUCAP 249* 316* 143* 232* 251*   Lipid Profile: No results for input(s): CHOL, HDL, LDLCALC, TRIG, CHOLHDL, LDLDIRECT in the last 72 hours. Thyroid Function Tests: No results for input(s): TSH, T4TOTAL, FREET4, T3FREE, THYROIDAB in the last 72 hours. Anemia Panel: No results for input(s): VITAMINB12, FOLATE, FERRITIN, TIBC, IRON, RETICCTPCT in the last 72 hours. Urine analysis:    Component  Value Date/Time   COLORURINE YELLOW 09/18/2016 0015   APPEARANCEUR CLOUDY (A) 09/18/2016 0015   LABSPEC 1.024 09/18/2016 0015   PHURINE 5.5 09/18/2016 0015   GLUCOSEU NEGATIVE 09/18/2016 0015   HGBUR NEGATIVE 09/18/2016 0015   BILIRUBINUR NEGATIVE 09/18/2016 0015   KETONESUR NEGATIVE 09/18/2016 0015   PROTEINUR >300 (A) 09/18/2016 0015   UROBILINOGEN 1.0 06/13/2015 0957   NITRITE NEGATIVE 09/18/2016 0015   LEUKOCYTESUR NEGATIVE 09/18/2016 0015   Sepsis Labs: @LABRCNTIP (procalcitonin:4,lacticidven:4)  ) Recent Results (from the past 240 hour(s))  Urine culture     Status: None   Collection Time: 09/18/16 12:13 AM  Result Value Ref Range Status   Specimen Description  URINE, CATHETERIZED  Final   Special Requests NONE  Final   Culture NO GROWTH  Final   Report Status 09/20/2016 FINAL  Final  Culture, blood (Routine X 2) w Reflex to ID Panel     Status: None (Preliminary result)   Collection Time: 09/19/16 12:40 AM  Result Value Ref Range Status   Specimen Description BLOOD RIGHT HAND  Final   Special Requests IN PEDIATRIC BOTTLE 1CC  Final   Culture NO GROWTH 3 DAYS  Final   Report Status PENDING  Incomplete  Culture, blood (Routine X 2) w Reflex to ID Panel     Status: None (Preliminary result)   Collection Time: 09/19/16 12:51 AM  Result Value Ref Range Status   Specimen Description BLOOD LEFT HAND  Final   Special Requests IN PEDIATRIC BOTTLE 1CC  Final   Culture NO GROWTH 3 DAYS  Final   Report Status PENDING  Incomplete      Studies: No results found.  Scheduled Meds: . aspirin  300 mg Rectal Daily   Or  . aspirin  325 mg Oral Daily  . baclofen  10 mg Oral Daily  . carvedilol  3.125 mg Oral BID WC  . clopidogrel  75 mg Oral Daily  . enoxaparin (LOVENOX) injection  40 mg Subcutaneous Daily  . ezetimibe  10 mg Oral Daily  . gabapentin  300 mg Oral TID  . insulin aspart  0-5 Units Subcutaneous QHS  . insulin aspart  0-9 Units Subcutaneous TID WC  . insulin NPH  Human  17 Units Subcutaneous BID AC & HS  . tamsulosin  0.4 mg Oral Daily    Continuous Infusions:    LOS: 3 days    Annita Brod, MD [facility] Pager [phone]  If 7PM-7AM, please contact night-coverage [email or web address] Password [email or web address] 09/22/2016, 6:15 PM

## 2016-09-22 NOTE — Progress Notes (Signed)
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 Percival Spanish  75 y.o. male w/ PMH of CAD (s/p CABG x5 in 2010, cath 05/2016 with 4/5 patent grafts), severe AS with pulmonary HTN, COPD, DM, PVD (s/p left BKA - followed by [name]), Stage 3 CKD, HTN, HLD, and carotid artery disease (s/p R CEA) admitted on 09/18/2016 for acute CVA. Cards consulted for elevated troponin.   Subjective   No chest pain or sob.   Inpatient Medications    . aspirin  300 mg Rectal Daily   Or  . aspirin  325 mg Oral Daily  . baclofen  10 mg Oral Daily  . carvedilol  3.125 mg Oral BID WC  . clopidogrel  75 mg Oral Daily  . enoxaparin (LOVENOX) injection  40 mg Subcutaneous Daily  . ezetimibe  10 mg Oral Daily  . gabapentin  300 mg Oral TID  . insulin aspart  0-5 Units Subcutaneous QHS  . insulin aspart  0-9 Units Subcutaneous TID WC  . insulin NPH Human  17 Units Subcutaneous BID AC & HS  . tamsulosin  0.4 mg Oral Daily    Vital Signs    Vitals:   09/21/16 2034 09/22/16 0012 09/22/16 0508 09/22/16 0800  BP: 122/79 97/63 116/74   Pulse: (!) 117 (!) 103 (!) 109   Resp: 16 (!) 23 (!) 23   Temp: 98.2 F (36.8 C) 98.2 F (36.8 C) 98.2 F (36.8 C) 98.1 F (36.7 C)  TempSrc: Oral Oral Oral Oral  SpO2: 95% 93% 94%   Weight:   180 lb 12.8 oz (82 kg)   Height:        Intake/Output Summary (Last 24 hours) at 09/22/16 0917 Last data filed at 09/21/16 1811  Gross per 24 hour  Intake               600 ml  Output              600 ml  Net                0 ml   Filed Weights   09/19/16 0455 09/21/16 0521 09/22/16 0508  Weight: 182 lb 12.8 oz (82.9 kg) 180 lb (81.6 kg) 180 lb 12.8 oz (82 kg)    Physical Exam    General: Elderly Caucasian  male appearing in no acute distress. Head: Normocephalic, atraumatic.  Neck: Supple without bruits, JVD not elevated. Lungs:  Resp regular and unlabored, CTA without wheezing or rales. Heart: RRR, S1, S2, no S3, S4, 3/6 SEM at RUSB; no rub. Abdomen: Soft, non-tender, non-distended with normoactive bowel sounds. No hepatomegaly. No rebound/guarding. No obvious abdominal masses. Extremities: No clubbing, cyanosis, or edema. Left BKA. Right lower extremity digits removed with healing-wound noted. Neuro: Alert and oriented X 3. Moves all extremities spontaneously. Psych: Normal affect.  Labs    CBC  Recent Labs  09/20/16 0044 09/21/16 0458  WBC 10.7* 9.5  NEUTROABS 7.0  6.2  HGB 10.0* 9.9*  HCT 32.2* 32.0*  MCV 88.2 87.4  PLT 251 0000000   Basic Metabolic Panel  Recent Labs  09/21/16 0458 09/22/16 0344  NA 136 138  K 3.5 3.8  CL 107 106  CO2 24 25  GLUCOSE 105* 134*  BUN 16 14  CREATININE 1.15 1.21  CALCIUM 9.2 9.0   Liver Function Tests  Recent Labs  09/20/16 0044 09/21/16 0458  AST 13* 13*  ALT 9* 7*  ALKPHOS 40 39  BILITOT 0.7 0.5  PROT 7.5 7.0  ALBUMIN 3.2* 3.1*   No results for input(s): LIPASE, AMYLASE in the last 72 hours. Cardiac Enzymes  Recent Labs  09/20/16 0044 09/20/16 0802 09/20/16 1307  TROPONINI 1.53* 1.32* 1.05*     Telemetry    NSR, HR in 100-110s  ECG    No new tracings.    Cardiac Studies and Radiology    Ct Angio Head W Or Wo Contrast  Addendum Date: 09/19/2016   ADDENDUM REPORT: 09/19/2016 14:12 ADDENDUM: Study discussed by telephone with Dr. Rosalin Hawking on 09/19/2016 at 1350 hours. Electronically Signed   By: Genevie Ann M.D.   On: 09/19/2016 14:12   Result Date:  09/19/2016 CLINICAL DATA:  75 year old male with left PCA infarct discovered on MRI for acute confusion. Initial encounter. EXAM: CT ANGIOGRAPHY HEAD AND NECK TECHNIQUE: Multidetector CT imaging of the head and neck was performed using the standard protocol during bolus administration of intravenous contrast. Multiplanar CT image reconstructions and MIPs were obtained to evaluate the vascular anatomy. Carotid stenosis measurements (when applicable) are obtained utilizing NASCET criteria, using the distal internal carotid diameter as the denominator. CONTRAST:  50 mL Isovue 370 COMPARISON:  Brain MRI 0133 hours today, and earlier. FINDINGS: CTA NECK Skeleton: Previous cervical C3-C4 absent dentition. No acute osseous abnormality identified. Visualized paranasal sinuses and mastoids are stable and well pneumatized. And C4-C5 ACDF. Previous median sternotomy. Upper chest: Small layering bilateral pleural effusions, that on the left also tracks into the major fissure. Septal thickening and mosaic attenuation in the bilateral lung parenchyma. Right peritracheal lymphadenopathy with individual nodes measuring 16 mm. Anterior carina nodes measure up to 13 mm. Sequelae of CABG. Other neck: Partially calcified 16 mm left thyroid hypodense nodule which meets consensus criteria for ultrasound follow-up. Negative larynx, pharynx, parapharyngeal spaces, retropharyngeal space, sublingual space, submandibular glands and parotid glands. No cervical lymphadenopathy. Aortic arch: Calcified aortic atherosclerosis. Three vessel arch configuration. Soft arch atherosclerosis as well. Right carotid system: Right CCA soft and calcified plaque resulting in stenosis of less than 50 % with respect to the distal vessel. Soft plaque continues in the posterior right CCA. There is soft plaque with less than 50% stenosis just proximal to the right carotid bifurcation. At the bifurcation there is soft and calcified plaque. No right ICA origin or  bulb stenosis. There is soft and calcified plaque at the right skullbase resulting in less than 50 % stenosis with respect to the distal vessel. Left carotid system: No left CCA origin stenosis. Soft plaque intermittently in the left CCA proximal to the bifurcation. Soft and calcified plaque at the bifurcation resulting in stenosis in the bulb up to 55-60 % with respect to the distal vessel. See series 401, image 87. There is soft plaque in the left ICA at the skullbase resulting in. Less than 50 % stenosis with respect to the distal vessel Vertebral arteries: No proximal right subclavian artery stenosis despite soft and calcified plaque. The right vertebral  artery origin is occluded. There is a calcification at the origin. The right vertebral artery is occluded up to the C2-C3 level where it appears to be mildly reconstituted from muscular branches, but is highly diminutive and terminates outside the skull. Soft plaque in the proximal left subclavian artery with less than 50 % stenosis with respect to the distal vessel. Soft and calcified plaque at the left vertebral artery origin with severe stenosis (series 405, image 121). Tortuous left V1 segment. Minimal plaque in the left V2 segment. Mild left V3 segment plaque without significant stenosis. CTA HEAD Posterior circulation: Patent distal left vertebral artery which appears somewhat dominant has no stenosis to the basilar. There is mild left V4 segment plaque. Both a ICAs appear dominant. The distal right vertebral artery is occluded. There is moderate basilar artery irregularity and mild stenosis of the basilar just distal to the AICA origins. The basilar remains patent. The SCA and left PCA origins are normal. The left posterior communicating artery is present. The left PCA is occluded in the distal P2 segment with no reconstituted flow. The right P1 is occluded but the right posterior communicating artery is patent although moderately to severely irregular  and stenotic. Still, the right PCA branches are patent but attenuated. Anterior circulation: The right ICA siphon is patent but there is radiographic string sign stenosis of the siphon in the vertical petrous segment (series 402, image 249 and at least moderate superimposed stenosis in the cavernous and supraclinoid segments. Still, the right ICA terminus is patent. The right posterior communicating and right ophthalmic artery origins are normal. The left ICA siphon is patent also with moderate to severe atherosclerosis. There is moderate stenosis in the left cavernous segment and severe stenosis in the left supraclinoid ICA related to confluent calcified plaque. In fat, there might be a short segment occlusion of the supraclinoid ICA segment, with the left ICA terminus supplied from the left posterior communicating artery. Nevertheless, both carotid termini are patent. The left MCA and ACA origins are within normal limits. The right ACA A1 segment is diminutive or absent. The anterior communicating artery and bilateral ACA branches are within normal limits. The right MCA origin is mildly irregular and stenotic. The right M1 remains patent. The right MCA bifurcation is patent. There is up to moderate stenosis at the origin of the anterior right MCA M2 branch. Other visualized right MCA branches are within normal limits. The left MCA proximal M1 segment is mildly irregular and stenotic. There is moderate irregularity and stenosis at the left MCA bifurcation. The left M2 branches remain patent, and are within normal limits. Venous sinuses: Negative. Anatomic variants: Dominant left ACA A1 segment. Delayed phase: Left PCA infarct remain subtle by CT. No abnormal enhancement identified. Review of the MIP images confirms the above findings IMPRESSION: 1. Left PCA occlusion in the distal P2 segment, corresponding to the acute left PCA infarct. 2. Right Vertebral Artery occlusion (short segment reconstitution from muscular  branches in the distal V2 segment). 3. 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 4. Right PCA P1 segment occlusion and poor reconstitution of the right PCA branches from the right posterior communicating artery which is highly atherosclerotic and stenotic. 5. Radiographic string sign stenosis of the Right ICA siphon in the distal petrous segment. At least moderate tandem stenoses from dense calcified plaque in the right cavernous and supraclinoid ICA segments, but the right ICA terminus remains patent.  6. High-grade stenosis versus short segment occlusion of the Left ICA supraclinoid segment. The left ICA terminus is patent but might be supplied from the left posterior communicating artery. 7. Mild to moderate bilateral MCA irregularity and stenosis, including moderate stenosis at the left MCA bifurcation. 8. Left greater than right carotid bifurcation atherosclerosis. Up to 60% left ICA bulb stenosis. 9. Bilateral pleural effusions with pulmonary ground-glass opacity and reactive appearing superior mediastinal lymphadenopathy. This constellation might reflect congestive heart failure with pulmonary edema. 10. Stable CT appearance of the brain. Electronically Signed: By: Genevie Ann M.D. On: 09/19/2016 13:44   Dg Chest 2 View  Result Date: 09/18/2016 CLINICAL DATA:  New left arm tremor. Pain in the left shoulder. Confusion today. Nausea and vomiting. Abnormal EKG. EXAM: CHEST  2 VIEW COMPARISON:  06/05/2016 FINDINGS: Postoperative changes in the mediastinum. Heart size and pulmonary vascularity are normal for technique. Blunting of the left costophrenic angle suggesting small pleural effusion. Mild atelectasis in the left face. Right lung is clear. No pneumothorax calcification of the aorta. Degenerative changes in the spine and shoulders. Postoperative change in the cervical spine. IMPRESSION: Small left pleural effusion with  basilar atelectasis. Electronically Signed   By: Lucienne Capers M.D.   On: 09/18/2016 22:35   Ct Head Wo Contrast  Result Date: 09/19/2016 CLINICAL DATA:  Altered mental status. New left arm tremor. Left shoulder pain. Confusion today. EXAM: CT HEAD WITHOUT CONTRAST TECHNIQUE: Contiguous axial images were obtained from the base of the skull through the vertex without intravenous contrast. COMPARISON:  CT head 05/14/2013.  MRI brain 05/15/2013. FINDINGS: Brain: Diffuse cerebral atrophy. Ventricular dilatation consistent with central atrophy. Low-attenuation changes in the deep white matter consistent small vessel ischemia. No mass effect or midline shift. No abnormal extra-axial fluid collections. Gray-white matter junctions are distinct. Basal cisterns are not effaced. No evidence of acute intracranial hemorrhage. Vascular: Atherosclerotic vascular calcifications are present. Skull: Normal. Negative for fracture or focal lesion. Sinuses/Orbits: Visualized paranasal sinuses are clear. Partial opacification of some of the right mastoid air cells. Left mastoids are clear. Other: No significant changes since previous study. IMPRESSION: No acute intracranial abnormalities. Chronic atrophy and small vessel ischemic changes. Electronically Signed   By: Lucienne Capers M.D.   On: 09/19/2016 00:23   Ct Angio Neck W Or Wo Contrast  Addendum Date: 09/19/2016   ADDENDUM REPORT: 09/19/2016 14:12 ADDENDUM: Study discussed by telephone with Dr. Rosalin Hawking on 09/19/2016 at 1350 hours. Electronically Signed   By: Genevie Ann M.D.   On: 09/19/2016 14:12   Result Date: 09/19/2016 CLINICAL DATA:  75 year old male with left PCA infarct discovered on MRI for acute confusion. Initial encounter. EXAM: CT ANGIOGRAPHY HEAD AND NECK TECHNIQUE: Multidetector CT imaging of the head and neck was performed using the standard protocol during bolus administration of intravenous contrast. Multiplanar CT image reconstructions and MIPs were  obtained to evaluate the vascular anatomy. Carotid stenosis measurements (when applicable) are obtained utilizing NASCET criteria, using the distal internal carotid diameter as the denominator. CONTRAST:  50 mL Isovue 370 COMPARISON:  Brain MRI 0133 hours today, and earlier. FINDINGS: CTA NECK Skeleton: Previous cervical C3-C4 absent dentition. No acute osseous abnormality identified. Visualized paranasal sinuses and mastoids are stable and well pneumatized. And C4-C5 ACDF. Previous median sternotomy. Upper chest: Small layering bilateral pleural effusions, that on the left also tracks into the major fissure. Septal thickening and mosaic attenuation in the bilateral lung parenchyma. Right peritracheal lymphadenopathy with individual nodes measuring 16 mm. Anterior  carina nodes measure up to 13 mm. Sequelae of CABG. Other neck: Partially calcified 16 mm left thyroid hypodense nodule which meets consensus criteria for ultrasound follow-up. Negative larynx, pharynx, parapharyngeal spaces, retropharyngeal space, sublingual space, submandibular glands and parotid glands. No cervical lymphadenopathy. Aortic arch: Calcified aortic atherosclerosis. Three vessel arch configuration. Soft arch atherosclerosis as well. Right carotid system: Right CCA soft and calcified plaque resulting in stenosis of less than 50 % with respect to the distal vessel. Soft plaque continues in the posterior right CCA. There is soft plaque with less than 50% stenosis just proximal to the right carotid bifurcation. At the bifurcation there is soft and calcified plaque. No right ICA origin or bulb stenosis. There is soft and calcified plaque at the right skullbase resulting in less than 50 % stenosis with respect to the distal vessel. Left carotid system: No left CCA origin stenosis. Soft plaque intermittently in the left CCA proximal to the bifurcation. Soft and calcified plaque at the bifurcation resulting in stenosis in the bulb up to 55-60 % with  respect to the distal vessel. See series 401, image 87. There is soft plaque in the left ICA at the skullbase resulting in. Less than 50 % stenosis with respect to the distal vessel Vertebral arteries: No proximal right subclavian artery stenosis despite soft and calcified plaque. 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 There is a calcification at the origin. The right vertebral artery is occluded up to the C2-C3 level where it appears to be mildly reconstituted from muscular branches, but is highly diminutive and terminates outside the skull. Soft plaque in the proximal left subclavian artery with less than 50 % stenosis with respect to the distal vessel. Soft and calcified plaque at the left vertebral artery origin with severe stenosis (series 405, image 121). Tortuous left V1 segment. Minimal plaque in the left V2 segment. Mild left V3 segment plaque without significant stenosis. CTA HEAD Posterior circulation: Patent distal left vertebral artery which appears somewhat dominant has no stenosis to the basilar. There is mild left V4 segment plaque. Both a ICAs appear dominant. The distal right vertebral artery is occluded. There is moderate basilar artery irregularity and mild stenosis of the basilar just distal to the AICA origins. The basilar remains patent. The SCA and left PCA origins are normal. The left posterior communicating artery is present. The left PCA is occluded in the distal P2 segment with no reconstituted flow. The right P1 is occluded but the right posterior communicating artery is patent although moderately to severely irregular and stenotic. Still, the right PCA branches are patent but attenuated. Anterior circulation: The right ICA siphon is patent but there is radiographic string sign stenosis of the siphon in the vertical petrous segment (series 402, image 249 and at least moderate superimposed stenosis in the cavernous and supraclinoid segments. Still, the right ICA terminus is  patent. The right posterior communicating and right ophthalmic artery origins are normal. The left ICA siphon is patent also with moderate to severe atherosclerosis. There is moderate stenosis in the left cavernous segment and severe stenosis in the left supraclinoid ICA related to confluent calcified plaque. In fat, there might be a short segment occlusion of the supraclinoid ICA segment, with the left ICA terminus supplied from the left posterior communicating artery. Nevertheless, both carotid termini are patent. The left MCA and ACA origins are within normal limits. The right ACA A1 segment is diminutive or absent. The anterior communicating artery and bilateral ACA branches are within  normal limits. The right MCA origin is mildly irregular and stenotic. The right M1 remains patent. The right MCA bifurcation is patent. There is up to moderate stenosis at the origin of the anterior right MCA M2 branch. Other visualized right MCA branches are within normal limits. The left MCA proximal M1 segment is mildly irregular and stenotic. There is moderate irregularity and stenosis at the left MCA bifurcation. The left M2 branches remain patent, and are within normal limits. Venous sinuses: Negative. Anatomic variants: Dominant left ACA A1 segment. Delayed phase: Left PCA infarct remain subtle by CT. No abnormal enhancement identified. Review of the MIP images confirms the above findings IMPRESSION: 1. Left PCA occlusion in the distal P2 segment, corresponding to the acute left PCA infarct. 2. Right Vertebral Artery occlusion (short segment reconstitution from muscular branches in the distal V2 segment). 3. 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 4. Right PCA P1 segment occlusion and poor reconstitution of the right PCA branches from the right posterior communicating artery which is highly atherosclerotic and stenotic. 5. Radiographic  string sign stenosis of the Right ICA siphon in the distal petrous segment. At least moderate tandem stenoses from dense calcified plaque in the right cavernous and supraclinoid ICA segments, but the right ICA terminus remains patent. 6. High-grade stenosis versus short segment occlusion of the Left ICA supraclinoid segment. The left ICA terminus is patent but might be supplied from the left posterior communicating artery. 7. Mild to moderate bilateral MCA irregularity and stenosis, including moderate stenosis at the left MCA bifurcation. 8. Left greater than right carotid bifurcation atherosclerosis. Up to 60% left ICA bulb stenosis. 9. Bilateral pleural effusions with pulmonary ground-glass opacity and reactive appearing superior mediastinal lymphadenopathy. This constellation might reflect congestive heart failure with pulmonary edema. 10. Stable CT appearance of the brain. Electronically Signed: By: Genevie Ann M.D. On: 09/19/2016 13:44   Mr Brain Wo Contrast  Result Date: 09/19/2016 CLINICAL DATA:  75 y/o M; confusion that started with awakening this morning and a few days of left-sided arm tremors. EXAM: MRI HEAD WITHOUT CONTRAST TECHNIQUE: Multiplanar, multiecho pulse sequences of the brain and surrounding structures were obtained without intravenous contrast. COMPARISON:  09/19/2016 CT head.  05/15/2013 MRI brain. FINDINGS: Brain: Diffusion restriction within the left posterior cerebral artery distribution involving the posterior hippocampus and occipital lobe. Background of moderate chronic microvascular ischemic changes and parenchymal volume loss. 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 No abnormal susceptibility hypointensity to indicate intracranial hemorrhage. Vascular: Irregularity of proximal flow voids is probably related intracranial atherosclerosis. Skull and upper cervical spine: Partially visualized susceptibility artifact from anterior  cervical discectomy and fusion of the upper cervical spine. Otherwise normal bone marrow signal. Sinuses/Orbits: No abnormal signal of paranasal sinuses. Right mastoid effusion. No abnormal signal of left mastoid. Orbits are unremarkable. Other: None. IMPRESSION: 1. Acute infarct within the left posterior cerebral artery distribution. No hemorrhage identified. 2. Background of moderate chronic microvascular ischemic changes and parenchymal volume loss. 3. 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 These results were called by telephone at the time of interpretation on 09/19/2016 at 2:21 am to Dr. Carmin Muskrat , who verbally acknowledged these results. Electronically Signed   By: Kristine Garbe M.D.   On: 09/19/2016 02:22    Echocardiogram: 02/2016 Study Conclusions  - Left ventricle: The cavity size was normal. Wall  thickness was   normal. Systolic function was severely reduced. The estimated   ejection fraction was in the range of 25% to 30%. Akinesis of the   inferior myocardium. - Aortic valve: There was moderate to severe stenosis. Valve area   (VTI): 0.66 cm^2. Valve area (Vmax): 0.62 cm^2. Valve area   (Vmean): 0.58 cm^2. - Left atrium: The atrium was mildly dilated. - Pulmonary arteries: Systolic pressure was moderately increased.   PA peak pressure: 54 mm Hg (S).  Echo 09/21/16 LV EF: 20% -   25%  ------------------------------------------------------------------- Indications:      CVA 436.  ------------------------------------------------------------------- History:   PMH:  Congestive heart failure. Elevated troponin. Ischemic cardiomyopathy/chronic systolic heart failure. Hyperlipidemia. Stage III chronic kidney disease. IDDM.  ------------------------------------------------------------------- Study Conclusions  - Left ventricle: The cavity size was normal. Wall thickness was   normal. Systolic function  was severely reduced. The estimated   ejection fraction was in the range of 20% to 25%. Diffuse   hypokinesis. - Ventricular septum: Septal motion showed abnormal function and   dyssynergy. The contour showed diastolic flattening. - Aortic valve: Valve mobility was restricted. There was severe   stenosis. Peak velocity (S): 448 cm/s. Mean velocity (S): 49   cm/s. - Mitral valve: Calcified annulus. Mildly thickened leaflets .   There was mild regurgitation. - Left atrium: The atrium was mildly dilated. - Pulmonary arteries: PA peak pressure: 57 mm Hg (S).  Impressions:  - Aortic stenosis is now severe when compared to prior. No cardiac   source of emboli was indentified.  Assessment & Plan    1. Elevated Troponin - Peak of troponin 1.73--> trending down. EKG shows ST depression in inferolateral leads, similar to previous tracings.  - thought to be secondary to demand ischemia in the setting of acute CVA and Stage 3 CKD. - Pending echo yesterday showed LV EF of 20-25%. Medical management.   2. 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 Cath in 05/2016 showing patent LIMA-LAD, SVG-OM2-OM3, SVG-distal RCA and occluded SVG-RI. - EF 25-30% by echo in 02/2016.  Echo yesterday showed howed LV EF of 20-25% and now has severe AS, abnormal septal WM. Will discuss with MD. Scr minimally increased to 1.21 today. - Continue BB, ASA, Plavix, and Zetia.  3. Severe AS - cath in 05/2016 showed severe calcific aortic valve stenosis with a peak to peak gradient of 52 mm Hg, mean gradient 52.9 mm Hg, and a calculated aortic valve area of 0.67 cm.  - consider TAVR eval following recovery of acute CVA. - repeat echo as above  4. HLD - 09/19/2016: Cholesterol 153; HDL 20; LDL Cholesterol 103; Triglycerides 150; VLDL 30 - intolerant to statin therapy. On Zetia. Referral to [facility] for possible initiation of PCSK9 as an outpatient.   5. 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 - Neurology following.  6. Possible seizure - Pending EEG   Signed, Bhagat,Bhavinkumar , PA-C 9:17 AM 09/22/2016    Patient seen and examined. Agree with assessment and plan.Still with visual difficulty. Echo dat reviewed with patient and wife; severe AS with peak gradient 80 mm Hg and mean 49 even with severe LV dysfunction EF 25%. Ultimate consideration for TAVR if he is felt to have good neurologic recovery. Tolerating initiation of coreg at 3.125 mg bid; not yet steady state, possibly increase dose tomorrow.    Troy Sine, MD, [facility] 09/22/2016 12:24 PM

## 2016-09-22 NOTE — Care Management Important Message (Signed)
Important Message  Patient Details  Name: Anthony Holder MRN: [id number] Date of Birth: October 19, 1941   Medicare Important Message Given:  Yes    [name] Abena 09/22/2016, 11:06 AM

## 2016-09-22 NOTE — Care Management Note (Signed)
Case Management Note  Patient Details  Name: Anthony Holder MRN: [id number] Date of Birth: Nov 18, 1941  Subjective/Objective: Pt presented for acute infarct. Pt is from home with wife. PT recommendations for HHPT. Pt has used [facility] in the past. Agency list provided and pt/wife chose [facility] again.                    Action/Plan: CM did ,make referral to [facility] for [facility]. SOC to begin within 24-48 hours post d/c. No DME needs at this time.   Expected Discharge Date:                  Expected Discharge Plan:  [place]  In-House Referral:  NA  Discharge planning Services  CM Consult  Post Acute Care Choice:  Home Health Choice offered to:  Patient, Spouse  DME Arranged:  N/A DME Agency:  NA  HH Arranged:  RN, PT [place] Agency:  [place]  Status of Service:  Completed, signed off  If discussed at [place] of Stay Meetings, dates discussed:    Additional Comments:  Bethena Roys, RN 09/22/2016, 3:55 PM

## 2016-09-22 NOTE — Procedures (Signed)
Electroencephalogram report- LTM  Ordering Physician : Dr. Leonie Man EEG number: [id number]    Beginning date or time: 09/21/16 4:34 PM Ending date or time: 09/22/16 7:30 AM  Day of study: day 1  Medications include: Per EMR  HISTORY: This 24 hours of intensive EEG monitoring with simultaneous video monitoring was performed for this patient with Seizure-like activity. This EEG was requested to further characterize the episodes to guide medical management.  TECHNICAL DESCRIPTION:  The study consists of a continuous 16-channel multi-montage digital video EEG recording with twenty-one electrodes placed according to the International 10-20 System. Additional leads included eye leads, true temporal leads (T1, T2), and an EKG lead.    REPORT: The background activity in this tracing consisted of 6-7 Hz theta activity. The activity is symmetric and reactive to stimuli. No epileptiform activity was identified.  Occasional delta slowing was seen in the left posterior quadrant. There were no pushbutton events during the recording.  IMPRESSION: This is an abnormal EEG due to: 1) Focal slowing in the left posterior quadrant 2) Mild diffuse slowing.  CLINICAL CORRELATION: This EEG is suggestive of focal neuronal dysfunction in the left posterior quadrant.  There is also mild diffuse encephalopathy.  No electrographic seizures were seen.

## 2016-09-23 DIAGNOSIS — N183 Chronic kidney disease, stage 3 (moderate): Secondary | ICD-10-CM

## 2016-09-23 DIAGNOSIS — R000 Tachycardia, unspecified: Secondary | ICD-10-CM

## 2016-09-23 LAB — PROCALCITONIN: PROCALCITONIN: 0.11 ng/mL

## 2016-09-23 LAB — GLUCOSE, CAPILLARY
GLUCOSE-CAPILLARY: 162 mg/dL — AB (ref 65–99)
Glucose-Capillary: 235 mg/dL — ABNORMAL HIGH (ref 65–99)
Glucose-Capillary: 162 mg/dL — ABNORMAL HIGH (ref 65–99)
Glucose-Capillary: 250 mg/dL — ABNORMAL HIGH (ref 65–99)

## 2016-09-23 MED ORDER — CLOPIDOGREL BISULFATE 75 MG PO TABS: 75.0000 mg | ORAL_TABLET | Freq: Every day | ORAL | 2 refills | Status: AC

## 2016-09-23 MED ORDER — TAMSULOSIN HCL 0.4 MG PO CAPS: 0.4000 mg | ORAL_CAPSULE | Freq: Every day | ORAL | 2 refills | Status: AC

## 2016-09-23 MED ORDER — CARVEDILOL 6.25 MG PO TABS: 6.2500 mg | ORAL_TABLET | Freq: Two times a day (BID) | ORAL | Status: DC

## 2016-09-23 MED ORDER — METOPROLOL TARTRATE 5 MG/5ML IV SOLN
2.5000 mg | Freq: Once | INTRAVENOUS | Status: AC
  Administered 2016-09-23: 2.5 mg via INTRAVENOUS
  Filled 2016-09-23: qty 5

## 2016-09-23 MED ORDER — ASPIRIN 325 MG PO TABS: 325.0000 mg | ORAL_TABLET | Freq: Every day | ORAL | 2 refills | Status: AC

## 2016-09-23 NOTE — Plan of Care (Signed)
Problem: Health Behavior/Discharge Planning: Goal: Ability to manage health-related needs will improve Outcome: Completed/Met Date Met: 09/23/16 Patient has received recommendations from PT and OT; patient has no speech therapy needs; patient and family have a plan for patient's care at discharge. Patient plans to go home with support from wife and HHRN; [place].

## 2016-09-23 NOTE — Progress Notes (Signed)
Notified K Schorr NP about patient's heart rate which has been sinus tachycardia in the mid 120s since change of shift. Earlier today, patient's heart rate was in the 100s-110s. All other vital signs are currently stable; patient is resting comfortably. Orders received for 2.5 IV metoprolol. Will continue to monitor.

## 2016-09-23 NOTE — Progress Notes (Signed)
Discharge instructions reviewed with patient and wife. Pt has no questions at this time. V/S stable. No complaints of pain. IV dc. Pt discharged home with wife.

## 2016-09-23 NOTE — Discharge Summary (Signed)
Discharge Summary  Anthony Holder [id number] DOB: 06-16-1941  PCP: [name], MD  Admit date: 09/18/2016 Discharge date: 09/23/2016  Time spent: 25 minutes   Recommendations for Outpatient Follow-up:  1. Medication change: Metoprolol discontinued 2. New Medications: Aspirin 325+ Plavix 75 mg by mouth daily 3 months, then Plavix 75 mg by mouth daily  3. New medication: Flomax or 0.4 by mouth daily 4. Patient follow-up stroke service in the next 3 months. 5. Patient follow up with cardiology in the next 2 weeks  Discharge Diagnoses:  Active Hospital Problems   Diagnosis Date Noted  . 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 Sinus tachycardia   . Seizure-like activity ([place])   . Gait abnormality   . Altered mental status 09/19/2016  . Acute CVA (cerebrovascular accident) ([place]) 09/19/2016  . PVD (peripheral vascular disease) ([facility]) 06/03/2016  . Chronic systolic congestive heart failure, NYHA class 1 ([place]) 06/03/2016  . Moderate to severe aortic stenosis 03/29/2012  . 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 Hypertensive heart disease     Resolved Hospital Problems   Diagnosis Date Noted Date Resolved  No resolved problems to display.    Discharge Condition: Improved, being discharged home with home health PT   Diet recommendation: Carb modified heart healthy  Vitals:   09/23/16 0800 09/23/16 1356  BP: (!) 105/58 110/62  Pulse:  (!) 101  Resp: 20 (!) 21  Temp:  98.1 F (36.7 C)    History of present illness:  75 year old past CAD status post CABG, CHF with EF of 25%, severe aortic stenosis with pulmonary hypertension and COPD admitted on 9/30 for 1 day of confusion and found to have a left PCA infarct.  Hospital Course:  Principal Problem:   Acute Cerebral infarction due to unspecified occlusion or stenosis of left posterior cerebral artery ([place]): Confirmed by  MRI. CT angiogram of head and neck noted left PCA occlusion and distal P2 segment. Transthoracic echocardiogram unrevealing for clot. Stroke felt to be secondary to large vessel atherosclerosis given extensive history of vasculopathy. However, given low EF, needs echocardiogram to rule out cardio embolic disease now on aspirin and Plavix therapy for 3 months and then Plavix alone.LDL at 103. Patient intolerant of statins, sustained on Zetia. A1c at 6.9, patient will continue on his home medications. Seen by physical therapy her recommending home health PT.  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 Patient will follow up with cardiology as outpatient at that time it can be determined if ACE inhibitor can be started. In the meantime, patient had been on both Coreg and metoprolol on admission. His medications were since held and then Coreg has been restarted at a lower dose and then slowly titrated up for heart rate    Gait abnormality   Seizure-like activity ([place]): Staring spells. Possibly some tonic activity in the previous night reported. Question of episodes of urinary incontinence  morning confusion. Routine EEG noted diffuse slow wave but no actual seizure activity. Overnight EEG noted focal slowing in the left posterior quadrant. After discussion with neurology, this was felt to also be nonspecific. Overall, patient has had no further seizure activity now for over 36 hours. Neurology feels that  antiepileptics are not indicated at this time. Patient will follow-up with neurology as outpatient.   Consultants:  Neurology  Cardiology   Procedures:  Echocardiogram: Severe aortic stenosis, decreased ejection fraction 20-25% Intracardiac emboli    lower extremity Dopplers done 10/1: No evidence of DVT  Discharge Exam: BP 110/62 (BP Location: Right Arm)   Pulse (!) 101   Temp 98.1 F (36.7 C) (Oral)   Resp (!) 21   Ht 5\' 10"  (1.778 m)   Wt 83.9 kg (185 lb)   SpO2 92%   BMI 26.54 kg/m   General: Alert and oriented 2, no acute distress  Cardiovascular: Regular rate and rhythm, borderline tachycardia  Respiratory: Clear to auscultation bilaterally   Discharge Instructions You were cared for by a hospitalist during your hospital stay. If you have any questions about your discharge medications or the care you received while you were in the hospital after you are discharged, you can call the unit and asked to speak with the hospitalist on call if the hospitalist that took care of you is not available. Once you are discharged, your primary care physician will handle any further medical issues. Please note that NO REFILLS for any discharge medications will be authorized once you are discharged, as it is imperative that you return to your primary care physician (or establish a relationship with a primary care physician if you do not have one) for your aftercare needs so that they can reassess your need for medications and monitor your lab values.  Discharge Instructions    Ambulatory referral to Neurology    Complete by:  As directed    Pt is Dr. Rhea Belton pt. Follow up with Dr. Krista Blue at [facility] in 6 weeks. Thanks.       Medication List    STOP taking these medications   atenolol 50 MG tablet Commonly known as:  TENORMIN     TAKE these medications   aspirin 325 MG tablet Take 1 tablet (325 mg total) by mouth daily. Start taking on:  09/24/2016   baclofen 10 MG tablet Commonly  known as:  LIORESAL Take 1 tablet (10 mg total) by mouth daily.   carvedilol 12.5 MG tablet Commonly known as:  COREG Take 1 tablet (12.5 mg total) by mouth 2 (two) times daily with a meal.   CINNAMON PO Take 3 tablets by mouth 2 (two) times daily.   clopidogrel 75 MG tablet Commonly known as:  PLAVIX Take 1 tablet (75 mg total) by mouth daily. Start taking on:  09/24/2016   ezetimibe 10 MG tablet Commonly known as:  ZETIA Take 1 tablet (10 mg total) by mouth daily.   furosemide 40 MG tablet Commonly known as:  LASIX Take 1 tablet 2 x/ day for BP & Fluid   gabapentin 300 MG capsule Commonly known as:  NEURONTIN Take 1 capsule (300 mg total) by mouth 3 (three) times daily.   metFORMIN 500 MG 24 hr tablet Commonly known as:  GLUCOPHAGE-XR Take 1,000 mg by mouth 2 (two) times daily.   nitroGLYCERIN 0.4 MG SL tablet Commonly known as:  NITROSTAT Place 1 tablet (0.4 mg total) under the tongue every 5 (five) minutes as needed for  chest pain.   NOVOLIN N RELION 100 UNIT/ML injection Generic drug:  insulin NPH Human Inject 17 Units into the skin See admin instructions. Patient states he takes 17 units at night and does not take in the AM unless glucose is above 150.   potassium chloride SA 20 MEQ tablet Commonly known as:  K-DUR,KLOR-CON Take 1 tablet (20 mEq total) by mouth daily.   tamsulosin 0.4 MG Caps capsule Commonly known as:  FLOMAX Take 1 capsule (0.4 mg total) by mouth daily. Start taking on:  09/24/2016   traMADol 50 MG tablet Commonly known as:  ULTRAM Take 1 tablet (50 mg total) by mouth every 6 (six) hours as needed for moderate pain.   vitamin B-12 500 MCG tablet Commonly known as:  CYANOCOBALAMIN Take 500 mcg by mouth daily.   Vitamin D-3 1000 units Caps Take 5,000 Units by mouth daily.      Allergies  Allergen Reactions  . Statins Other (See Comments)    Patient prefers to not take statins   Follow-up Information    YAN,YIJUN, MD Follow up in  6 week(s).   Specialty:  Neurology Contact information: [place] [address] [place] .   Why:  Registered Nurse and Physical Therapy Contact information: [address] [place] [address] [phone]        [place] .   Contact information: [address] [place] [address] [phone]            The results of significant diagnostics from this hospitalization (including imaging, microbiology, ancillary and laboratory) are listed below for reference.    Significant Diagnostic Studies: Ct Angio Head W Or Wo Contrast  Addendum Date: 09/19/2016   ADDENDUM REPORT: 09/19/2016 14:12 ADDENDUM: Study discussed by telephone with Dr. Rosalin Hawking on 09/19/2016 at 1350 hours. Electronically Signed   By: Genevie Ann M.D.   On: 09/19/2016 14:12   Result Date: 09/19/2016 CLINICAL DATA:  75 year old male with left PCA infarct discovered on MRI for acute confusion. Initial encounter. EXAM: CT ANGIOGRAPHY HEAD AND NECK TECHNIQUE: Multidetector CT imaging of the head and neck was performed using the standard protocol during bolus administration of intravenous contrast. Multiplanar CT image reconstructions and MIPs were obtained to evaluate the vascular anatomy. Carotid stenosis measurements (when applicable) are obtained utilizing NASCET criteria, using the distal internal carotid diameter as the denominator. CONTRAST:  50 mL Isovue 370 COMPARISON:  Brain MRI 0133 hours today, and earlier. FINDINGS: CTA NECK Skeleton: Previous cervical C3-C4 absent dentition. No acute osseous abnormality identified. Visualized paranasal sinuses and mastoids are stable and well pneumatized. And C4-C5 ACDF. Previous median sternotomy. Upper chest: Small layering bilateral pleural effusions, that on the left also tracks into the major fissure. Septal thickening and mosaic attenuation in the bilateral lung  parenchyma. Right peritracheal lymphadenopathy with individual nodes measuring 16 mm. Anterior carina nodes measure up to 13 mm. Sequelae of CABG. Other neck: Partially calcified 16 mm left thyroid hypodense nodule which meets consensus criteria for ultrasound follow-up. Negative larynx, pharynx, parapharyngeal spaces, retropharyngeal space, sublingual space, submandibular glands and parotid glands. No cervical lymphadenopathy. Aortic arch: Calcified aortic atherosclerosis. Three vessel arch configuration. Soft arch atherosclerosis as well. Right carotid system: Right CCA soft and calcified plaque resulting in stenosis of less than 50 % with respect to the distal vessel. Soft plaque continues in the posterior right CCA. There is soft  plaque with less than 50% stenosis just proximal to the right carotid bifurcation. At the bifurcation there is soft and calcified plaque. No right ICA origin or bulb stenosis. There is soft and calcified plaque at the right skullbase resulting in less than 50 % stenosis with respect to the distal vessel. Left carotid system: No left CCA origin stenosis. Soft plaque intermittently in the left CCA proximal to the bifurcation. Soft and calcified plaque at the bifurcation resulting in stenosis in the bulb up to 55-60 % with respect to the distal vessel. See series 401, image 87. There is soft plaque in the left ICA at the skullbase resulting in. Less than 50 % stenosis with respect to the distal vessel Vertebral arteries: No proximal right subclavian artery stenosis despite soft and calcified plaque. 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 There is a calcification at the origin. The right vertebral artery is occluded up to the C2-C3 level where it appears to be mildly reconstituted from muscular branches, but is highly diminutive and terminates outside the skull. Soft plaque in the proximal left subclavian artery with less than 50 % stenosis with respect to the distal vessel. Soft  and calcified plaque at the left vertebral artery origin with severe stenosis (series 405, image 121). Tortuous left V1 segment. Minimal plaque in the left V2 segment. Mild left V3 segment plaque without significant stenosis. CTA HEAD Posterior circulation: Patent distal left vertebral artery which appears somewhat dominant has no stenosis to the basilar. There is mild left V4 segment plaque. Both a ICAs appear dominant. The distal right vertebral artery is occluded. There is moderate basilar artery irregularity and mild stenosis of the basilar just distal to the AICA origins. The basilar remains patent. The SCA and left PCA origins are normal. The left posterior communicating artery is present. The left PCA is occluded in the distal P2 segment with no reconstituted flow. The right P1 is occluded but the right posterior communicating artery is patent although moderately to severely irregular and stenotic. Still, the right PCA branches are patent but attenuated. Anterior circulation: The right ICA siphon is patent but there is radiographic string sign stenosis of the siphon in the vertical petrous segment (series 402, image 249 and at least moderate superimposed stenosis in the cavernous and supraclinoid segments. Still, the right ICA terminus is patent. The right posterior communicating and right ophthalmic artery origins are normal. The left ICA siphon is patent also with moderate to severe atherosclerosis. There is moderate stenosis in the left cavernous segment and severe stenosis in the left supraclinoid ICA related to confluent calcified plaque. In fat, there might be a short segment occlusion of the supraclinoid ICA segment, with the left ICA terminus supplied from the left posterior communicating artery. Nevertheless, both carotid termini are patent. The left MCA and ACA origins are within normal limits. The right ACA A1 segment is diminutive or absent. The anterior communicating artery and bilateral ACA  branches are within normal limits. The right MCA origin is mildly irregular and stenotic. The right M1 remains patent. The right MCA bifurcation is patent. There is up to moderate stenosis at the origin of the anterior right MCA M2 branch. Other visualized right MCA branches are within normal limits. The left MCA proximal M1 segment is mildly irregular and stenotic. There is moderate irregularity and stenosis at the left MCA bifurcation. The left M2 branches remain patent, and are within normal limits. Venous sinuses: Negative. Anatomic variants: Dominant left ACA A1 segment. Delayed phase:  Left PCA infarct remain subtle by CT. No abnormal enhancement identified. Review of the MIP images confirms the above findings IMPRESSION: 1. Left PCA occlusion in the distal P2 segment, corresponding to the acute left PCA infarct. 2. Right Vertebral Artery occlusion (short segment reconstitution from muscular branches in the distal V2 segment). 3. 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 4. Right PCA P1 segment occlusion and poor reconstitution of the right PCA branches from the right posterior communicating artery which is highly atherosclerotic and stenotic. 5. Radiographic string sign stenosis of the Right ICA siphon in the distal petrous segment. At least moderate tandem stenoses from dense calcified plaque in the right cavernous and supraclinoid ICA segments, but the right ICA terminus remains patent. 6. High-grade stenosis versus short segment occlusion of the Left ICA supraclinoid segment. The left ICA terminus is patent but might be supplied from the left posterior communicating artery. 7. Mild to moderate bilateral MCA irregularity and stenosis, including moderate stenosis at the left MCA bifurcation. 8. Left greater than right carotid bifurcation atherosclerosis. Up to 60% left ICA bulb stenosis. 9. Bilateral pleural effusions with  pulmonary ground-glass opacity and reactive appearing superior mediastinal lymphadenopathy. This constellation might reflect congestive heart failure with pulmonary edema. 10. Stable CT appearance of the brain. Electronically Signed: By: Genevie Ann M.D. On: 09/19/2016 13:44   Dg Chest 2 View  Result Date: 09/18/2016 CLINICAL DATA:  New left arm tremor. Pain in the left shoulder. Confusion today. Nausea and vomiting. Abnormal EKG. EXAM: CHEST  2 VIEW COMPARISON:  06/05/2016 FINDINGS: Postoperative changes in the mediastinum. Heart size and pulmonary vascularity are normal for technique. Blunting of the left costophrenic angle suggesting small pleural effusion. Mild atelectasis in the left face. Right lung is clear. No pneumothorax calcification of the aorta. Degenerative changes in the spine and shoulders. Postoperative change in the cervical spine. IMPRESSION: Small left pleural effusion with basilar atelectasis. Electronically Signed   By: Lucienne Capers M.D.   On: 09/18/2016 22:35   Ct Head Wo Contrast  Result Date: 09/19/2016 CLINICAL DATA:  Altered mental status. New left arm tremor. Left shoulder pain. Confusion today. EXAM: CT HEAD WITHOUT CONTRAST TECHNIQUE: Contiguous axial images were obtained from the base of the skull through the vertex without intravenous contrast. COMPARISON:  CT head 05/14/2013.  MRI brain 05/15/2013. FINDINGS: Brain: Diffuse cerebral atrophy. Ventricular dilatation consistent with central atrophy. Low-attenuation changes in the deep white matter consistent small vessel ischemia. No mass effect or midline shift. No abnormal extra-axial fluid collections. Gray-white matter junctions are distinct. Basal cisterns are not effaced. No evidence of acute intracranial hemorrhage. Vascular: Atherosclerotic vascular calcifications are present. Skull: Normal. Negative for fracture or focal lesion. Sinuses/Orbits: Visualized paranasal sinuses are clear. Partial opacification of some of the  right mastoid air cells. Left mastoids are clear. Other: No significant changes since previous study. IMPRESSION: No acute intracranial abnormalities. Chronic atrophy and small vessel ischemic changes. Electronically Signed   By: Lucienne Capers M.D.   On: 09/19/2016 00:23   Ct Angio Neck W Or Wo Contrast  Addendum Date: 09/19/2016   ADDENDUM REPORT: 09/19/2016 14:12 ADDENDUM: Study discussed by telephone with Dr. Rosalin Hawking on 09/19/2016 at 1350 hours. Electronically Signed   By: Genevie Ann M.D.   On: 09/19/2016 14:12   Result Date: 09/19/2016 CLINICAL DATA:  75 year old male with left PCA infarct discovered on MRI for acute confusion. Initial encounter. EXAM: CT  ANGIOGRAPHY HEAD AND NECK TECHNIQUE: Multidetector CT imaging of the head and neck was performed using the standard protocol during bolus administration of intravenous contrast. Multiplanar CT image reconstructions and MIPs were obtained to evaluate the vascular anatomy. Carotid stenosis measurements (when applicable) are obtained utilizing NASCET criteria, using the distal internal carotid diameter as the denominator. CONTRAST:  50 mL Isovue 370 COMPARISON:  Brain MRI 0133 hours today, and earlier. FINDINGS: CTA NECK Skeleton: Previous cervical C3-C4 absent dentition. No acute osseous abnormality identified. Visualized paranasal sinuses and mastoids are stable and well pneumatized. And C4-C5 ACDF. Previous median sternotomy. Upper chest: Small layering bilateral pleural effusions, that on the left also tracks into the major fissure. Septal thickening and mosaic attenuation in the bilateral lung parenchyma. Right peritracheal lymphadenopathy with individual nodes measuring 16 mm. Anterior carina nodes measure up to 13 mm. Sequelae of CABG. Other neck: Partially calcified 16 mm left thyroid hypodense nodule which meets consensus criteria for ultrasound follow-up. Negative larynx, pharynx, parapharyngeal spaces, retropharyngeal space, sublingual space,  submandibular glands and parotid glands. No cervical lymphadenopathy. Aortic arch: Calcified aortic atherosclerosis. Three vessel arch configuration. Soft arch atherosclerosis as well. Right carotid system: Right CCA soft and calcified plaque resulting in stenosis of less than 50 % with respect to the distal vessel. Soft plaque continues in the posterior right CCA. There is soft plaque with less than 50% stenosis just proximal to the right carotid bifurcation. At the bifurcation there is soft and calcified plaque. No right ICA origin or bulb stenosis. There is soft and calcified plaque at the right skullbase resulting in less than 50 % stenosis with respect to the distal vessel. Left carotid system: No left CCA origin stenosis. Soft plaque intermittently in the left CCA proximal to the bifurcation. Soft and calcified plaque at the bifurcation resulting in stenosis in the bulb up to 55-60 % with respect to the distal vessel. See series 401, image 87. There is soft plaque in the left ICA at the skullbase resulting in. Less than 50 % stenosis with respect to the distal vessel Vertebral arteries: No proximal right subclavian artery stenosis despite soft and calcified plaque. 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 There is a calcification at the origin. The right vertebral artery is occluded up to the C2-C3 level where it appears to be mildly reconstituted from muscular branches, but is highly diminutive and terminates outside the skull. Soft plaque in the proximal left subclavian artery with less than 50 % stenosis with respect to the distal vessel. Soft and calcified plaque at the left vertebral artery origin with severe stenosis (series 405, image 121). Tortuous left V1 segment. Minimal plaque in the left V2 segment. Mild left V3 segment plaque without significant stenosis. CTA HEAD Posterior circulation: Patent distal left vertebral artery which appears somewhat dominant has no stenosis to the basilar. There  is mild left V4 segment plaque. Both a ICAs appear dominant. The distal right vertebral artery is occluded. There is moderate basilar artery irregularity and mild stenosis of the basilar just distal to the AICA origins. The basilar remains patent. The SCA and left PCA origins are normal. The left posterior communicating artery is present. The left PCA is occluded in the distal P2 segment with no reconstituted flow. The right P1 is occluded but the right posterior communicating artery is patent although moderately to severely irregular and stenotic. Still, the right PCA branches are patent but attenuated. Anterior circulation: The right ICA siphon is patent but there is radiographic string  sign stenosis of the siphon in the vertical petrous segment (series 402, image 249 and at least moderate superimposed stenosis in the cavernous and supraclinoid segments. Still, the right ICA terminus is patent. The right posterior communicating and right ophthalmic artery origins are normal. The left ICA siphon is patent also with moderate to severe atherosclerosis. There is moderate stenosis in the left cavernous segment and severe stenosis in the left supraclinoid ICA related to confluent calcified plaque. In fat, there might be a short segment occlusion of the supraclinoid ICA segment, with the left ICA terminus supplied from the left posterior communicating artery. Nevertheless, both carotid termini are patent. The left MCA and ACA origins are within normal limits. The right ACA A1 segment is diminutive or absent. The anterior communicating artery and bilateral ACA branches are within normal limits. The right MCA origin is mildly irregular and stenotic. The right M1 remains patent. The right MCA bifurcation is patent. There is up to moderate stenosis at the origin of the anterior right MCA M2 branch. Other visualized right MCA branches are within normal limits. The left MCA proximal M1 segment is mildly irregular and stenotic.  There is moderate irregularity and stenosis at the left MCA bifurcation. The left M2 branches remain patent, and are within normal limits. Venous sinuses: Negative. Anatomic variants: Dominant left ACA A1 segment. Delayed phase: Left PCA infarct remain subtle by CT. No abnormal enhancement identified. Review of the MIP images confirms the above findings IMPRESSION: 1. Left PCA occlusion in the distal P2 segment, corresponding to the acute left PCA infarct. 2. Right Vertebral Artery occlusion (short segment reconstitution from muscular branches in the distal V2 segment). 3. 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 4. Right PCA P1 segment occlusion and poor reconstitution of the right PCA branches from the right posterior communicating artery which is highly atherosclerotic and stenotic. 5. Radiographic string sign stenosis of the Right ICA siphon in the distal petrous segment. At least moderate tandem stenoses from dense calcified plaque in the right cavernous and supraclinoid ICA segments, but the right ICA terminus remains patent. 6. High-grade stenosis versus short segment occlusion of the Left ICA supraclinoid segment. The left ICA terminus is patent but might be supplied from the left posterior communicating artery. 7. Mild to moderate bilateral MCA irregularity and stenosis, including moderate stenosis at the left MCA bifurcation. 8. Left greater than right carotid bifurcation atherosclerosis. Up to 60% left ICA bulb stenosis. 9. Bilateral pleural effusions with pulmonary ground-glass opacity and reactive appearing superior mediastinal lymphadenopathy. This constellation might reflect congestive heart failure with pulmonary edema. 10. Stable CT appearance of the brain. Electronically Signed: By: Genevie Ann M.D. On: 09/19/2016 13:44   Mr Brain Wo Contrast  Result Date: 09/19/2016 CLINICAL DATA:  75 y/o M; confusion that  started with awakening this morning and a few days of left-sided arm tremors. EXAM: MRI HEAD WITHOUT CONTRAST TECHNIQUE: Multiplanar, multiecho pulse sequences of the brain and surrounding structures were obtained without intravenous contrast. COMPARISON:  09/19/2016 CT head.  05/15/2013 MRI brain. FINDINGS: Brain: Diffusion restriction within the left posterior cerebral artery distribution involving the posterior hippocampus and occipital lobe. Background of moderate chronic microvascular ischemic changes and parenchymal volume loss. 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 No abnormal susceptibility hypointensity to indicate intracranial hemorrhage. Vascular: Irregularity of proximal flow voids is probably related intracranial atherosclerosis. Skull and upper cervical  spine: Partially visualized susceptibility artifact from anterior cervical discectomy and fusion of the upper cervical spine. Otherwise normal bone marrow signal. Sinuses/Orbits: No abnormal signal of paranasal sinuses. Right mastoid effusion. No abnormal signal of left mastoid. Orbits are unremarkable. Other: None. IMPRESSION: 1. Acute infarct within the left posterior cerebral artery distribution. No hemorrhage identified. 2. Background of moderate chronic microvascular ischemic changes and parenchymal volume loss. 3. 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 These results were called by telephone at the time of interpretation on 09/19/2016 at 2:21 am to Dr. Carmin Muskrat , who verbally acknowledged these results. Electronically Signed   By: Kristine Garbe M.D.   On: 09/19/2016 02:22    Microbiology: Recent Results (from the past 240 hour(s))  Urine culture     Status: None   Collection Time: 09/18/16 12:13 AM  Result Value Ref Range Status   Specimen Description URINE, CATHETERIZED  Final   Special Requests NONE  Final    Culture NO GROWTH  Final   Report Status 09/20/2016 FINAL  Final  Culture, blood (Routine X 2) w Reflex to ID Panel     Status: None (Preliminary result)   Collection Time: 09/19/16 12:40 AM  Result Value Ref Range Status   Specimen Description BLOOD RIGHT HAND  Final   Special Requests IN PEDIATRIC BOTTLE 1CC  Final   Culture NO GROWTH 4 DAYS  Final   Report Status PENDING  Incomplete  Culture, blood (Routine X 2) w Reflex to ID Panel     Status: None (Preliminary result)   Collection Time: 09/19/16 12:51 AM  Result Value Ref Range Status   Specimen Description BLOOD LEFT HAND  Final   Special Requests IN PEDIATRIC BOTTLE 1CC  Final   Culture NO GROWTH 4 DAYS  Final   Report Status PENDING  Incomplete     Labs: Basic Metabolic Panel:  Recent Labs Lab 09/18/16 2113 09/19/16 0847 09/20/16 0044 09/21/16 0458 09/22/16 0344  NA 135 135 136 136 138  K 4.9 4.2 3.9 3.5 3.8  CL 101 101 103 107 106  CO2 21* 24 23 24 25   GLUCOSE 280* 289* 125* 105* 134*  BUN 24* 22* 22* 16 14  CREATININE 1.42* 1.27* 1.32* 1.15 1.21  CALCIUM 10.0 9.6 9.4 9.2 9.0  MG  --  1.8  --   --   --   PHOS  --  3.0  --   --   --    Liver Function Tests:  Recent Labs Lab 09/18/16 2113 09/19/16 0847 09/20/16 0044 09/21/16 0458  AST 18 16 13* 13*  ALT 9* 8* 9* 7*  ALKPHOS 46 41 40 39  BILITOT 0.8 0.6 0.7 0.5  PROT 8.1 7.8 7.5 7.0  ALBUMIN 3.7 3.4* 3.2* 3.1*   No results for input(s): LIPASE, AMYLASE in the last 168 hours. No results for input(s): AMMONIA in the last 168 hours. CBC:  Recent Labs Lab 09/18/16 2114 09/19/16 0847 09/20/16 0044 09/21/16 0458  WBC 14.6* 10.7* 10.7* 9.5  NEUTROABS 10.1* 7.2 7.0 6.2  HGB 11.0* 10.1* 10.0* 9.9*  HCT 35.1* 31.5* 32.2* 32.0*  MCV 88.6 87.5 88.2 87.4  PLT 302 298 251 246   Cardiac Enzymes:  Recent Labs Lab 09/19/16 1319 09/19/16 1913 09/20/16 0044 09/20/16 0802 09/20/16 1307  TROPONINI 1.62* 1.44* 1.53* 1.32* 1.05*   BNP: BNP (last 3  results)  Recent Labs  02/27/16 1204  BNP 1,997.3*    ProBNP (last  3 results) No results for input(s): PROBNP in the last 8760 hours.  CBG:  Recent Labs Lab 09/22/16 2008 09/23/16 0116 09/23/16 0556 09/23/16 0733 09/23/16 1110  GLUCAP 328* 235* 162* 162* 250*       Signed:  Annita Brod, MD [facility] 09/23/2016, 2:13 PM

## 2016-09-23 NOTE — Progress Notes (Signed)
Physical Therapy Treatment Patient Details Name: Anthony Holder MRN: [id number] DOB: 08-May-1941 Today's Date: 09/23/2016    History of Present Illness Anthony Holder is a 75 y.o. male with a history of CAD s/p CABG, severe AS with pulmonary HTN, COPD, DM, PVD followed by Dr. Donnetta Holder, CKD stage III, HTN, HLD,  carotid artery disease s/p R CEA who presented with confusion.  Positive for L PCA infarct.    PT Comments    Pt is able to walk the short trip in his room after the longer one last PT visit, which was a couple days ago.  He is able to benefit from more frequent therapy to allow better progression with acute treatment, so will make him a priority tomorrow to ease his transition home.    Follow Up Recommendations  Home health PT     Equipment Recommendations  None recommended by PT    Recommendations for Other Services Rehab consult     Precautions / Restrictions Precautions Precautions: Fall Precaution Comments: L prosthesis, R heel wedge darco Restrictions Weight Bearing Restrictions: No    Mobility  Bed Mobility Overal bed mobility: Needs Assistance Bed Mobility: Supine to Sit;Sit to Supine     Supine to sit: Min assist;Mod assist Sit to supine: Min assist;Mod assist   General bed mobility comments: weaker today  Transfers Overall transfer level: Needs assistance Equipment used: Rolling walker (2 wheeled) Transfers: Sit to/from [facility] Sit to Stand: Min assist Stand pivot transfers: Min assist       General transfer comment: pt needs help to get to EOB but then to use hands correctly for standing power up  Ambulation/Gait Ambulation/Gait assistance: Min assist Ambulation Distance (Feet): 40 Feet Assistive device: Rolling walker (2 wheeled) Gait Pattern/deviations: Wide base of support;Ataxic;Shuffle;Decreased stride length Gait velocity: reduced Gait velocity interpretation: Below normal speed for age/gender General Gait Details: pt  walks in room only as he is very weak, pulse up to 121 with short walking effort   Stairs            Wheelchair Mobility    Modified Rankin (Stroke Patients Only)       Balance     Sitting balance-Leahy Scale: Fair     Standing balance support: Bilateral upper extremity supported Standing balance-Leahy Scale: Poor Standing balance comment: donned L BK prosthesis bedside with help as pt is weak and can maintain sit control for this but holds railing on bed                    Cognition Arousal/Alertness: Awake/alert Behavior During Therapy: WFL for tasks assessed/performed Overall Cognitive Status: Impaired/Different from baseline Area of Impairment: Following commands;Safety/judgement;Awareness;Problem solving     Memory: Decreased short-term memory;Decreased recall of precautions Following Commands: Follows one step commands with increased time Safety/Judgement: Decreased awareness of deficits;Decreased awareness of safety Awareness: Intellectual Problem Solving: Slow processing;Difficulty sequencing;Requires verbal cues      Exercises      General Comments General comments (skin integrity, edema, etc.): Wife is concerned about his ability to maneuver but is expecting rehab to be done before going home      Pertinent Vitals/Pain Pain Assessment: No/denies pain    Home Living                      Prior Function            PT Goals (current goals can now be found in the care plan section) Acute Rehab  PT Goals Patient Stated Goal: To go home PT Goal Formulation: With patient/family Progress towards PT goals: Progressing toward goals    Frequency    Min 4X/week      PT Plan Current plan remains appropriate    Co-evaluation             End of Session Equipment Utilized During Treatment: Gait belt Activity Tolerance: Treatment limited secondary to medical complications (Comment);Patient limited by fatigue (pulse) Patient  left: in bed;with call bell/phone within reach;with bed alarm set;with family/visitor present     Time: [id number] PT Time Calculation (min) (ACUTE ONLY): 31 min  Charges:  $Gait Training: 8-22 mins $Therapeutic Activity: 8-22 mins                    G Codes:      Anthony Holder 10-13-16, 2:11 PM    Anthony Holder, PT MS Acute Rehab Dept. Number: [place] [facility] [place]

## 2016-09-23 NOTE — Progress Notes (Signed)
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 Percival Spanish  75 y.o. male w/ PMH of CAD (s/p CABG x5 in 2010, cath 05/2016 with 4/5 patent grafts), severe AS with pulmonary HTN, COPD, DM, PVD (s/p left BKA - followed by [name]), Stage 3 CKD, HTN, HLD, and carotid artery disease (s/p R CEA) admitted on 09/18/2016 for acute CVA. Cards consulted for elevated troponin.   Subjective   No chest pain or sob. No complains.   Inpatient Medications    . aspirin  300 mg Rectal Daily   Or  . aspirin  325 mg Oral Daily  . baclofen  10 mg Oral Daily  . carvedilol  3.125 mg Oral BID WC  . clopidogrel  75 mg Oral Daily  . enoxaparin (LOVENOX) injection  40 mg Subcutaneous Daily  . ezetimibe  10 mg Oral Daily  . gabapentin  300 mg Oral TID  . insulin aspart  0-5 Units Subcutaneous QHS  . insulin aspart  0-9 Units Subcutaneous TID WC  . insulin NPH Human  17 Units Subcutaneous BID AC & HS  . tamsulosin  0.4 mg Oral Daily    Vital Signs    Vitals:   09/23/16 0022 09/23/16 0346 09/23/16 0757 09/23/16 0800  BP: 112/74 100/66 (!) 105/58 (!) 105/58  Pulse: (!) 125 (!) 121    Resp: (!) 23 14 (!) 25 20  Temp: 98.7 F (37.1 C) 98.7 F (37.1 C) 98.6 F (37 C)   TempSrc: Oral Oral Oral   SpO2: 90% 96% 95% 90%  Weight:  185 lb (83.9 kg)    Height:        Intake/Output Summary (Last 24 hours) at 09/23/16 0856 Last data filed at 09/23/16 0348  Gross per 24 hour  Intake               640 ml  Output             1850 ml  Net            -1210 ml   Filed Weights   09/21/16 0521 09/22/16 0508 09/23/16 0346  Weight: 180 lb (81.6 kg) 180 lb 12.8 oz (82 kg) 185 lb (83.9 kg)    Physical Exam    General: Elderly Caucasian  male appearing in no acute distress. Head: Normocephalic, atraumatic.  Neck: Supple without bruits, JVD not elevated. Lungs:  Resp regular and unlabored, CTA without wheezing or rales. Heart: RRR, S1, S2, no S3, S4, 3/6 SEM at RUSB; no rub. Abdomen: Soft, non-tender, non-distended with normoactive bowel sounds. No hepatomegaly. No rebound/guarding. No obvious abdominal masses. Extremities: No clubbing, cyanosis, or edema. Left BKA. Right lower extremity digits removed with healing-wound noted. Neuro: Alert and oriented X 3. Moves all extremities spontaneously. Psych: Normal affect.  Labs    CBC  Recent Labs  09/21/16 0458  WBC 9.5  NEUTROABS 6.2  HGB 9.9*  HCT 32.0*  MCV 87.4  PLT 246  Basic Metabolic Panel  Recent Labs  09/21/16 0458 09/22/16 0344  NA 136 138  K 3.5 3.8  CL 107 106  CO2 24 25  GLUCOSE 105* 134*  BUN 16 14  CREATININE 1.15 1.21  CALCIUM 9.2 9.0   Liver Function Tests  Recent Labs  09/21/16 0458  AST 13*  ALT 7*  ALKPHOS 39  BILITOT 0.5  PROT 7.0  ALBUMIN 3.1*   No results for input(s): LIPASE, AMYLASE in the last 72 hours. Cardiac Enzymes  Recent Labs  09/20/16 1307  TROPONINI 1.05*     Telemetry    NSR, HR in 110-120s  ECG    No new tracings.    Cardiac Studies and Radiology    Ct Angio Head W Or Wo Contrast  Addendum Date: 09/19/2016   ADDENDUM REPORT: 09/19/2016 14:12 ADDENDUM: Study discussed by telephone with Dr. Rosalin Hawking on 09/19/2016 at 1350 hours. Electronically Signed   By: Genevie Ann M.D.   On: 09/19/2016 14:12   Result Date: 09/19/2016 CLINICAL DATA:  75 year old male with left PCA infarct discovered on MRI for acute confusion. Initial encounter. EXAM: CT ANGIOGRAPHY HEAD  AND NECK TECHNIQUE: Multidetector CT imaging of the head and neck was performed using the standard protocol during bolus administration of intravenous contrast. Multiplanar CT image reconstructions and MIPs were obtained to evaluate the vascular anatomy. Carotid stenosis measurements (when applicable) are obtained utilizing NASCET criteria, using the distal internal carotid diameter as the denominator. CONTRAST:  50 mL Isovue 370 COMPARISON:  Brain MRI 0133 hours today, and earlier. FINDINGS: CTA NECK Skeleton: Previous cervical C3-C4 absent dentition. No acute osseous abnormality identified. Visualized paranasal sinuses and mastoids are stable and well pneumatized. And C4-C5 ACDF. Previous median sternotomy. Upper chest: Small layering bilateral pleural effusions, that on the left also tracks into the major fissure. Septal thickening and mosaic attenuation in the bilateral lung parenchyma. Right peritracheal lymphadenopathy with individual nodes measuring 16 mm. Anterior carina nodes measure up to 13 mm. Sequelae of CABG. Other neck: Partially calcified 16 mm left thyroid hypodense nodule which meets consensus criteria for ultrasound follow-up. Negative larynx, pharynx, parapharyngeal spaces, retropharyngeal space, sublingual space, submandibular glands and parotid glands. No cervical lymphadenopathy. Aortic arch: Calcified aortic atherosclerosis. Three vessel arch configuration. Soft arch atherosclerosis as well. Right carotid system: Right CCA soft and calcified plaque resulting in stenosis of less than 50 % with respect to the distal vessel. Soft plaque continues in the posterior right CCA. There is soft plaque with less than 50% stenosis just proximal to the right carotid bifurcation. At the bifurcation there is soft and calcified plaque. No right ICA origin or bulb stenosis. There is soft and calcified plaque at the right skullbase resulting in less than 50 % stenosis with respect to the distal vessel. Left  carotid system: No left CCA origin stenosis. Soft plaque intermittently in the left CCA proximal to the bifurcation. Soft and calcified plaque at the bifurcation resulting in stenosis in the bulb up to 55-60 % with respect to the distal vessel. See series 401, image 87. There is soft plaque in the left ICA at the skullbase resulting in. Less than 50 % stenosis with respect to the distal vessel Vertebral arteries: No proximal right subclavian artery stenosis despite soft and calcified plaque. 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 There is a calcification at the origin. The right vertebral artery is occluded up to the C2-C3 level where it appears to be mildly reconstituted from muscular branches, but  is highly diminutive and terminates outside the skull. Soft plaque in the proximal left subclavian artery with less than 50 % stenosis with respect to the distal vessel. Soft and calcified plaque at the left vertebral artery origin with severe stenosis (series 405, image 121). Tortuous left V1 segment. Minimal plaque in the left V2 segment. Mild left V3 segment plaque without significant stenosis. CTA HEAD Posterior circulation: Patent distal left vertebral artery which appears somewhat dominant has no stenosis to the basilar. There is mild left V4 segment plaque. Both a ICAs appear dominant. The distal right vertebral artery is occluded. There is moderate basilar artery irregularity and mild stenosis of the basilar just distal to the AICA origins. The basilar remains patent. The SCA and left PCA origins are normal. The left posterior communicating artery is present. The left PCA is occluded in the distal P2 segment with no reconstituted flow. The right P1 is occluded but the right posterior communicating artery is patent although moderately to severely irregular and stenotic. Still, the right PCA branches are patent but attenuated. Anterior circulation: The right ICA siphon is patent but there is radiographic  string sign stenosis of the siphon in the vertical petrous segment (series 402, image 249 and at least moderate superimposed stenosis in the cavernous and supraclinoid segments. Still, the right ICA terminus is patent. The right posterior communicating and right ophthalmic artery origins are normal. The left ICA siphon is patent also with moderate to severe atherosclerosis. There is moderate stenosis in the left cavernous segment and severe stenosis in the left supraclinoid ICA related to confluent calcified plaque. In fat, there might be a short segment occlusion of the supraclinoid ICA segment, with the left ICA terminus supplied from the left posterior communicating artery. Nevertheless, both carotid termini are patent. The left MCA and ACA origins are within normal limits. The right ACA A1 segment is diminutive or absent. The anterior communicating artery and bilateral ACA branches are within normal limits. The right MCA origin is mildly irregular and stenotic. The right M1 remains patent. The right MCA bifurcation is patent. There is up to moderate stenosis at the origin of the anterior right MCA M2 branch. Other visualized right MCA branches are within normal limits. The left MCA proximal M1 segment is mildly irregular and stenotic. There is moderate irregularity and stenosis at the left MCA bifurcation. The left M2 branches remain patent, and are within normal limits. Venous sinuses: Negative. Anatomic variants: Dominant left ACA A1 segment. Delayed phase: Left PCA infarct remain subtle by CT. No abnormal enhancement identified. Review of the MIP images confirms the above findings IMPRESSION: 1. Left PCA occlusion in the distal P2 segment, corresponding to the acute left PCA infarct. 2. Right Vertebral Artery occlusion (short segment reconstitution from muscular branches in the distal V2 segment). 3. Superimposed Severe atherosclerotic stenosis at the origin of the Left Vertebral Artery which is the lone  supply to the Basilar. 4. Right PCA P1 segment occlusion and poor reconstitution of the right PCA branches from the right posterior communicating artery which is highly atherosclerotic and stenotic. 5. Radiographic string sign stenosis of the Right ICA siphon in the distal petrous segment. At least moderate tandem stenoses from dense calcified plaque in the right cavernous and supraclinoid ICA segments, but the right ICA terminus remains patent. 6. High-grade stenosis versus short segment occlusion of the Left ICA supraclinoid segment. The left ICA terminus is patent but might be supplied from the left posterior communicating artery. 7. Mild to moderate  bilateral MCA irregularity and stenosis, including moderate stenosis at the left MCA bifurcation. 8. Left greater than right carotid bifurcation atherosclerosis. Up to 60% left ICA bulb stenosis. 9. Bilateral pleural effusions with pulmonary ground-glass opacity and reactive appearing superior mediastinal lymphadenopathy. This constellation might reflect congestive heart failure with pulmonary edema. 10. Stable CT appearance of the brain. Electronically Signed: By: Genevie Ann M.D. On: 09/19/2016 13:44   Dg Chest 2 View  Result Date: 09/18/2016 CLINICAL DATA:  New left arm tremor. Pain in the left shoulder. Confusion today. Nausea and vomiting. Abnormal EKG. EXAM: CHEST  2 VIEW COMPARISON:  06/05/2016 FINDINGS: Postoperative changes in the mediastinum. Heart size and pulmonary vascularity are normal for technique. Blunting of the left costophrenic angle suggesting small pleural effusion. Mild atelectasis in the left face. Right lung is clear. No pneumothorax calcification of the aorta. Degenerative changes in the spine and shoulders. Postoperative change in the cervical spine. IMPRESSION: Small left pleural effusion with basilar atelectasis. Electronically Signed   By: Lucienne Capers M.D.   On: 09/18/2016 22:35   Ct Head Wo Contrast  Result Date:  09/19/2016 CLINICAL DATA:  Altered mental status. New left arm tremor. Left shoulder pain. Confusion today. EXAM: CT HEAD WITHOUT CONTRAST TECHNIQUE: Contiguous axial images were obtained from the base of the skull through the vertex without intravenous contrast. COMPARISON:  CT head 05/14/2013.  MRI brain 05/15/2013. FINDINGS: Brain: Diffuse cerebral atrophy. Ventricular dilatation consistent with central atrophy. Low-attenuation changes in the deep white matter consistent small vessel ischemia. No mass effect or midline shift. No abnormal extra-axial fluid collections. Gray-white matter junctions are distinct. Basal cisterns are not effaced. No evidence of acute intracranial hemorrhage. Vascular: Atherosclerotic vascular calcifications are present. Skull: Normal. Negative for fracture or focal lesion. Sinuses/Orbits: Visualized paranasal sinuses are clear. Partial opacification of some of the right mastoid air cells. Left mastoids are clear. Other: No significant changes since previous study. IMPRESSION: No acute intracranial abnormalities. Chronic atrophy and small vessel ischemic changes. Electronically Signed   By: Lucienne Capers M.D.   On: 09/19/2016 00:23   Ct Angio Neck W Or Wo Contrast  Addendum Date: 09/19/2016   ADDENDUM REPORT: 09/19/2016 14:12 ADDENDUM: Study discussed by telephone with Dr. Rosalin Hawking on 09/19/2016 at 1350 hours. Electronically Signed   By: Genevie Ann M.D.   On: 09/19/2016 14:12   Result Date: 09/19/2016 CLINICAL DATA:  75 year old male with left PCA infarct discovered on MRI for acute confusion. Initial encounter. EXAM: CT ANGIOGRAPHY HEAD AND NECK TECHNIQUE: Multidetector CT imaging of the head and neck was performed using the standard protocol during bolus administration of intravenous contrast. Multiplanar CT image reconstructions and MIPs were obtained to evaluate the vascular anatomy. Carotid stenosis measurements (when applicable) are obtained utilizing NASCET criteria, using  the distal internal carotid diameter as the denominator. CONTRAST:  50 mL Isovue 370 COMPARISON:  Brain MRI 0133 hours today, and earlier. FINDINGS: CTA NECK Skeleton: Previous cervical C3-C4 absent dentition. No acute osseous abnormality identified. Visualized paranasal sinuses and mastoids are stable and well pneumatized. And C4-C5 ACDF. Previous median sternotomy. Upper chest: Small layering bilateral pleural effusions, that on the left also tracks into the major fissure. Septal thickening and mosaic attenuation in the bilateral lung parenchyma. Right peritracheal lymphadenopathy with individual nodes measuring 16 mm. Anterior carina nodes measure up to 13 mm. Sequelae of CABG. Other neck: Partially calcified 16 mm left thyroid hypodense nodule which meets consensus criteria for ultrasound follow-up. Negative larynx, pharynx, parapharyngeal spaces, retropharyngeal  space, sublingual space, submandibular glands and parotid glands. No cervical lymphadenopathy. Aortic arch: Calcified aortic atherosclerosis. Three vessel arch configuration. Soft arch atherosclerosis as well. Right carotid system: Right CCA soft and calcified plaque resulting in stenosis of less than 50 % with respect to the distal vessel. Soft plaque continues in the posterior right CCA. There is soft plaque with less than 50% stenosis just proximal to the right carotid bifurcation. At the bifurcation there is soft and calcified plaque. No right ICA origin or bulb stenosis. There is soft and calcified plaque at the right skullbase resulting in less than 50 % stenosis with respect to the distal vessel. Left carotid system: No left CCA origin stenosis. Soft plaque intermittently in the left CCA proximal to the bifurcation. Soft and calcified plaque at the bifurcation resulting in stenosis in the bulb up to 55-60 % with respect to the distal vessel. See series 401, image 87. There is soft plaque in the left ICA at the skullbase resulting in. Less than 50  % stenosis with respect to the distal vessel Vertebral arteries: No proximal right subclavian artery stenosis despite soft and calcified plaque. 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 There is a calcification at the origin. The right vertebral artery is occluded up to the C2-C3 level where it appears to be mildly reconstituted from muscular branches, but is highly diminutive and terminates outside the skull. Soft plaque in the proximal left subclavian artery with less than 50 % stenosis with respect to the distal vessel. Soft and calcified plaque at the left vertebral artery origin with severe stenosis (series 405, image 121). Tortuous left V1 segment. Minimal plaque in the left V2 segment. Mild left V3 segment plaque without significant stenosis. CTA HEAD Posterior circulation: Patent distal left vertebral artery which appears somewhat dominant has no stenosis to the basilar. There is mild left V4 segment plaque. Both a ICAs appear dominant. The distal right vertebral artery is occluded. There is moderate basilar artery irregularity and mild stenosis of the basilar just distal to the AICA origins. The basilar remains patent. The SCA and left PCA origins are normal. The left posterior communicating artery is present. The left PCA is occluded in the distal P2 segment with no reconstituted flow. The right P1 is occluded but the right posterior communicating artery is patent although moderately to severely irregular and stenotic. Still, the right PCA branches are patent but attenuated. Anterior circulation: The right ICA siphon is patent but there is radiographic string sign stenosis of the siphon in the vertical petrous segment (series 402, image 249 and at least moderate superimposed stenosis in the cavernous and supraclinoid segments. Still, the right ICA terminus is patent. The right posterior communicating and right ophthalmic artery origins are normal. The left ICA siphon is patent also with moderate to  severe atherosclerosis. There is moderate stenosis in the left cavernous segment and severe stenosis in the left supraclinoid ICA related to confluent calcified plaque. In fat, there might be a short segment occlusion of the supraclinoid ICA segment, with the left ICA terminus supplied from the left posterior communicating artery. Nevertheless, both carotid termini are patent. The left MCA and ACA origins are within normal limits. The right ACA A1 segment is diminutive or absent. The anterior communicating artery and bilateral ACA branches are within normal limits. The right MCA origin is mildly irregular and stenotic. The right M1 remains patent. The right MCA bifurcation is patent. There is up to moderate stenosis at the origin of the  anterior right MCA M2 branch. Other visualized right MCA branches are within normal limits. The left MCA proximal M1 segment is mildly irregular and stenotic. There is moderate irregularity and stenosis at the left MCA bifurcation. The left M2 branches remain patent, and are within normal limits. Venous sinuses: Negative. Anatomic variants: Dominant left ACA A1 segment. Delayed phase: Left PCA infarct remain subtle by CT. No abnormal enhancement identified. Review of the MIP images confirms the above findings IMPRESSION: 1. Left PCA occlusion in the distal P2 segment, corresponding to the acute left PCA infarct. 2. Right Vertebral Artery occlusion (short segment reconstitution from muscular branches in the distal V2 segment). 3. 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 4. Right PCA P1 segment occlusion and poor reconstitution of the right PCA branches from the right posterior communicating artery which is highly atherosclerotic and stenotic. 5. Radiographic string sign stenosis of the Right ICA siphon in the distal petrous segment. At least moderate tandem stenoses from dense calcified plaque in  the right cavernous and supraclinoid ICA segments, but the right ICA terminus remains patent. 6. High-grade stenosis versus short segment occlusion of the Left ICA supraclinoid segment. The left ICA terminus is patent but might be supplied from the left posterior communicating artery. 7. Mild to moderate bilateral MCA irregularity and stenosis, including moderate stenosis at the left MCA bifurcation. 8. Left greater than right carotid bifurcation atherosclerosis. Up to 60% left ICA bulb stenosis. 9. Bilateral pleural effusions with pulmonary ground-glass opacity and reactive appearing superior mediastinal lymphadenopathy. This constellation might reflect congestive heart failure with pulmonary edema. 10. Stable CT appearance of the brain. Electronically Signed: By: Genevie Ann M.D. On: 09/19/2016 13:44   Mr Brain Wo Contrast  Result Date: 09/19/2016 CLINICAL DATA:  75 y/o M; confusion that started with awakening this morning and a few days of left-sided arm tremors. EXAM: MRI HEAD WITHOUT CONTRAST TECHNIQUE: Multiplanar, multiecho pulse sequences of the brain and surrounding structures were obtained without intravenous contrast. COMPARISON:  09/19/2016 CT head.  05/15/2013 MRI brain. FINDINGS: Brain: Diffusion restriction within the left posterior cerebral artery distribution involving the posterior hippocampus and occipital lobe. Background of moderate chronic microvascular ischemic changes and parenchymal volume loss. 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 No abnormal susceptibility hypointensity to indicate intracranial hemorrhage. Vascular: Irregularity of proximal flow voids is probably related intracranial atherosclerosis. Skull and upper cervical spine: Partially visualized susceptibility artifact from anterior cervical discectomy and fusion of the upper cervical spine. Otherwise normal bone marrow signal. Sinuses/Orbits: No abnormal signal of paranasal  sinuses. Right mastoid effusion. No abnormal signal of left mastoid. Orbits are unremarkable. Other: None. IMPRESSION: 1. Acute infarct within the left posterior cerebral artery distribution. No hemorrhage identified. 2. Background of moderate chronic microvascular ischemic changes and parenchymal volume loss. 3. 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 These results were called by telephone at the time of interpretation on 09/19/2016 at 2:21 am to Dr. Carmin Muskrat , who verbally acknowledged these results. Electronically Signed   By: Kristine Garbe M.D.   On: 09/19/2016 02:22    Echocardiogram: 02/2016 Study Conclusions  - Left ventricle: The cavity size was normal. Wall thickness was   normal. Systolic function was severely reduced. The estimated   ejection fraction was in the range of 25% to 30%. Akinesis of the   inferior myocardium. - Aortic  valve: There was moderate to severe stenosis. Valve area   (VTI): 0.66 cm^2. Valve area (Vmax): 0.62 cm^2. Valve area   (Vmean): 0.58 cm^2. - Left atrium: The atrium was mildly dilated. - Pulmonary arteries: Systolic pressure was moderately increased.   PA peak pressure: 54 mm Hg (S).  Echo 09/21/16 LV EF: 20% -   25%  ------------------------------------------------------------------- Indications:      CVA 436.  ------------------------------------------------------------------- History:   PMH:  Congestive heart failure. Elevated troponin. Ischemic cardiomyopathy/chronic systolic heart failure. Hyperlipidemia. Stage III chronic kidney disease. IDDM.  ------------------------------------------------------------------- Study Conclusions  - Left ventricle: The cavity size was normal. Wall thickness was   normal. Systolic function was severely reduced. The estimated   ejection fraction was in the range of 20% to 25%. Diffuse   hypokinesis. - Ventricular septum: Septal  motion showed abnormal function and   dyssynergy. The contour showed diastolic flattening. - Aortic valve: Valve mobility was restricted. There was severe   stenosis. Peak velocity (S): 448 cm/s. Mean velocity (S): 49   cm/s. - Mitral valve: Calcified annulus. Mildly thickened leaflets .   There was mild regurgitation. - Left atrium: The atrium was mildly dilated. - Pulmonary arteries: PA peak pressure: 57 mm Hg (S).  Impressions:  - Aortic stenosis is now severe when compared to prior. No cardiac   source of emboli was indentified.  Assessment & Plan    1. Elevated Troponin - Peak of troponin 1.73--> trending down. EKG shows ST depression in inferolateral leads, similar to previous tracings.  - thought to be secondary to demand ischemia in the setting of acute CVA and Stage 3 CKD. - Pending echo yesterday showed LV EF of 20-25%. Medical management.   2. 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 Cath in 05/2016 showing patent LIMA-LAD, SVG-OM2-OM3, SVG-distal RCA and occluded SVG-RI. - EF 25-30% by echo in 02/2016.  Echo 09/21/16 showed howed LV EF of 20-25% and now has severe AS, abnormal septal WM (severe AS with peak gradient 80 mm Hg and mean 49). He need to recover from stroke prior to [facility] consideration.  - Continue ASA, Plavix, and Zetia. Difficult situation with low blood pressure and HR of 110-120s.   3. Severe AS - cath in 05/2016 showed severe calcific aortic valve stenosis with a peak to peak gradient of 52 mm Hg, mean gradient 52.9 mm Hg, and a calculated aortic valve area of 0.67 cm.  - consider TAVR eval following recovery of acute CVA. - repeat echo as above  4. HLD - 09/19/2016: Cholesterol 153; HDL 20; LDL Cholesterol 103; Triglycerides 150; VLDL 30 - intolerant to statin therapy. On Zetia. Referral to [facility] for possible initiation of PCSK9 as an outpatient.   5. Acute CVA - MRI showing acute infarct within the left posterior  cerebral artery distribution. - Neurology following.  6. Possible seizure -Per neuro  MD to see today. Close f/u as outpatient.   Signed, Leanor Kail , PA-C 8:56 AM 09/23/2016    Patient seen and examined. Agree with assessment and plan. I/O -535 since admission.  BP now 105 - 110 range. Still with sinus tachycardia. He has received 5 doses of coreg 3.125 mg bid; HR better but will now titrate to 6.25 mg bid.  Will see tomorrow to make certain he is tolerating.   Troy Sine, MD, [facility] 09/23/2016 9:46 AM

## 2016-09-23 NOTE — Progress Notes (Signed)
Results for SAMORI, PACHA (MRN [id number]) as of 09/23/2016 12:14  Ref. Range 09/22/2016 20:08 09/23/2016 01:16 09/23/2016 05:56 09/23/2016 07:33 09/23/2016 11:10  Glucose-Capillary Latest Ref Range: 65 - 99 mg/dL 328 (H) 235 (H) 162 (H) 162 (H) 250 (H)   Noted that CBGs greater than 180 mg/dl. Recommend increasing Novolog to MODERATE TID & HS if blood sugars continue to be elevated. Harvel Ricks RN [name] CDE

## 2016-09-23 NOTE — Progress Notes (Signed)
STROKE TEAM PROGRESS NOTE   SUBJECTIVE (INTERVAL HISTORY) Anthony Holder is at the bedside. She reports no further seizure like episodes overnight. Bed side continuous EEG recording was negative for seizures. OBJECTIVE Temp:  [98.5 F (36.9 C)-98.7 F (37.1 C)] 98.6 F (37 C) (10/05 0757) Pulse Rate:  [115-125] 121 (10/05 0346) Cardiac Rhythm: Sinus tachycardia (10/05 0700) Resp:  [14-25] 20 (10/05 0800) BP: (100-112)/(58-74) 105/58 (10/05 0800) SpO2:  [90 %-96 %] 90 % (10/05 0800) Weight:  [185 lb (83.9 kg)] 185 lb (83.9 kg) (10/05 0346)  CBC:   Recent Labs Lab 09/20/16 0044 09/21/16 0458  WBC 10.7* 9.5  NEUTROABS 7.0 6.2  HGB 10.0* 9.9*  HCT 32.2* 32.0*  MCV 88.2 87.4  PLT 251 0000000    Basic Metabolic Panel:   Recent Labs Lab 09/19/16 0847  09/21/16 0458 09/22/16 0344  NA 135  < > 136 138  K 4.2  < > 3.5 3.8  CL 101  < > 107 106  CO2 24  < > 24 25  GLUCOSE 289*  < > 105* 134*  BUN 22*  < > 16 14  CREATININE 1.27*  < > 1.15 1.21  CALCIUM 9.6  < > 9.2 9.0  MG 1.8  --   --   --   PHOS 3.0  --   --   --   < > = values in this interval not displayed.  Lipid Panel:     Component Value Date/Time   CHOL 153 09/19/2016 0425   TRIG 150 (H) 09/19/2016 0425   HDL 20 (L) 09/19/2016 0425   CHOLHDL 7.7 09/19/2016 0425   VLDL 30 09/19/2016 0425   LDLCALC 103 (H) 09/19/2016 0425   HgbA1c:  Lab Results  Component Value Date   HGBA1C 6.9 (H) 09/19/2016   Urine Drug Screen:     Component Value Date/Time   LABOPIA NONE DETECTED 09/18/2016 0015   COCAINSCRNUR NONE DETECTED 09/18/2016 0015   LABBENZ POSITIVE (A) 09/18/2016 0015   AMPHETMU NONE DETECTED 09/18/2016 0015   THCU NONE DETECTED 09/18/2016 0015   LABBARB NONE DETECTED 09/18/2016 0015      IMAGING I have personally reviewed the radiological images below and agree with the radiology interpretations.  Dg Chest 2 View 09/18/2016 Small left pleural effusion with basilar atelectasis.   Ct Head Wo  Contrast 09/19/2016 No acute intracranial abnormalities. Chronic atrophy and small vessel ischemic changes.   Mr Brain Wo Contrast 09/19/2016 1. Acute infarct within the left posterior cerebral artery distribution. No hemorrhage identified.  2. Background of moderate chronic microvascular ischemic changes and parenchymal volume loss.  3. 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   CTA head and neck  1. Left PCA occlusion in the distal P2 segment, corresponding to the acute left PCA infarct. 2. Right Vertebral Artery occlusion (short segment reconstitution from muscular branches in the distal V2 segment). 3. 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 4. Right PCA P1 segment occlusion and poor reconstitution of the right PCA branches from the right posterior communicating artery which is highly atherosclerotic and stenotic. 5. Radiographic string sign stenosis of the Right ICA siphon in the distal petrous segment. At least moderate tandem stenoses from dense calcified plaque in the right cavernous and supraclinoid ICA segments, but the right ICA terminus remains patent. 6. High-grade stenosis versus short segment occlusion of the Left ICA supraclinoid  segment. The left ICA terminus is patent but might be supplied from the left posterior communicating artery. 7. Mild to moderate bilateral MCA irregularity and stenosis, including moderate stenosis at the left MCA bifurcation. 8. Left greater than right carotid bifurcation atherosclerosis. Up to 60% left ICA bulb stenosis. 9. Bilateral pleural effusions with pulmonary ground-glass opacity and reactive appearing superior mediastinal lymphadenopathy. This constellation might reflect congestive heart failure with pulmonary edema. 10. Stable CT appearance of the brain.  LE venous doppler  No obvious  evidence of deep vein thrombosis involving the visualized veins of the bilateral lower extremities.  EEG  This awake and asleep EEG is abnormal due to mild diffuse slowing of the waking background. Clinical Correlation of the above findings indicates diffuse cerebral dysfunction that is non-specific in etiology and can be seen with hypoxic/ischemic injury, toxic/metabolic encephalopathies, neurodegenerative disorders, or medication effect.  The absence of epileptiform discharges does not rule out a clinical diagnosis of epilepsy.  Clinical correlation is advised.  TTE Left ventricle: The cavity size was normal. Wall thickness was   normal. Systolic function was severely reduced. The estimated   ejection fraction was in the range of 20% to 25%. Diffuse   hypokinesis. - Ventricular septum: Septal motion showed abnormal function and   dyssynergy. The contour showed diastolic flattening. - Aortic valve: Valve mobility was restricted. There was severe   stenosis. Peak velocity (S): 448 cm/s. Mean velocity (S): 49   cm/s.  Overnight LTM EEG  Negative for seizures   PHYSICAL EXAM  Temp:  [98.5 F (36.9 C)-98.7 F (37.1 C)] 98.6 F (37 C) (10/05 0757) Pulse Rate:  [115-125] 121 (10/05 0346) Resp:  [14-25] 20 (10/05 0800) BP: (100-112)/(58-74) 105/58 (10/05 0800) SpO2:  [90 %-96 %] 90 % (10/05 0800) Weight:  [185 lb (83.9 kg)] 185 lb (83.9 kg) (10/05 0346)  General - Well nourished, well developed, in no apparent distress.  Ophthalmologic - Fundi not visualized due to noncooperation.  Cardiovascular - Regular rate and rhythm.  Mental Status -  Level of arousal and orientation to time, place, and person were intact. Language including expression, naming, repetition, comprehension was assessed and found intact, paucity of speech with mild dysarthria due to lack of teeth. Psychomotor slowing.  Cranial Nerves II - XII - II - right upper quadrantanopia. III, IV, VI - Extraocular  movements intact. V - Facial sensation intact bilaterally. VII - Facial movement intact bilaterally. VIII - Hearing & vestibular intact bilaterally. X - Palate elevates symmetrically. XI - Chin turning & shoulder shrug intact bilaterally. XII - Tongue protrusion intact.  Motor Strength - The patient's strength was normal in all extremities except LLE BKA and R toes amputation with dressing and pronator drift was absent.  Bulk was normal and fasciculations were absent.   Motor Tone - Muscle tone was assessed at the neck and appendages and was normal.  Reflexes - The patient's reflexes were 1+ in all extremities and he had no pathological reflexes.  Sensory - Light touch, temperature/pinprick were assessed and were symmetrical.    Coordination - The patient had normal movements in the hands with no ataxia or dysmetria, slow in action.  Tremor was absent.  Gait and Station - not tested due to amputations    ASSESSMENT/PLAN Anthony Holder is a 75 y.o. male with history of DM, HTN, severe AS, CAD s/p CABG, HLD, left leg BKA, s/p right CEA, CHF with EF 30%, right thalamic stroke in 2013 presenting with confusion and decreased  speech output. He did not receive IV t-PA due to out of window.   Stroke:  left PCA infarct, likely due to large vessel atherosclerosis given pt extensive hx of vasculopathy. However, cardioembolic can not be ruled out due to low EF.   Resultant  Right upper quadrantanopia  MRI  Left PCA infarct, old right thalamic infarct  CTA head and neck - left P2 occlusion. Multifocal severe intracranial vessel stenosis, diffuse athero. 2D Echo  Left ventricle: The cavity size was normal. Wall thickness was   normal. Systolic function was severely reduced. The estimated   ejection fraction was in the range of 20% to 25%. Diffuse    hypokinesis.  LE venous doppler negative for DVT  LDL 103  HgbA1c 6.9  lovenox for VTE prophylaxis Diet heart healthy/carb modified  Room service appropriate? Yes; Fluid consistency: Thin  No antithrombotic prior to admission, now on aspirin 325 mg daily. Recommend DAPT for 3 motnhs and then plavix alone.   Patient counseled to be compliant with Anthony antithrombotic medications  Ongoing aggressive stroke risk factor management  Therapy recommendations:  None  Disposition:  home  Seizure like activity  Holder reports staring spells PTA but seems still responding to questions during the staring  Holder reported tonic activity UEs last night, 3-4 episodes with urinary incontinence and morning confusion  Routine EEG diffuse slow but no seizure  overnight EEG LTM monitoring negative for sezures .  Cardiomyopathy  Low EF 25-30% in 02/2016  Likely due to severe CAD s/p CABG and stenting  2D EF 20-25% severe AS   Cardiology on board  Hypertension  Stable Permissive hypertension (OK if < 180/105) but gradually normalize in 5-7 days Long-term BP goal normotensive  Hyperlipidemia  Home meds:  zetia, resumed in hospital  LDL 103, goal < 70  On Zetia, continue on discharge  Pt has statin allergy  Diabetes  HgbA1c 6.9, goal < 7.0  Controlled  fluctuating CBG  On home meds - NPH  SSI  Close outpt follow up for better DM control  Other Stroke Risk Factors  Advanced age  Remote Cigarette smoking hx  Hx stroke/TIA - 2013 right thalamic infarct  Coronary artery disease s/p CABG  Severe AS, pending intervention  S/p right CEA  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 Continue dual antiplatelet therapy for stroke. D/w Holder and answered questions. 50% of time during this 25 minute visit was spent on counseling and coordination of care about Anthony stroke, possible seizures and answered questions. Will sign off. Kindly call for questions. Discussed with Dr [place] Pink.Follow up in  stroke clinic in 6 weeks with Dr Erlinda Hong  Antony Contras, MD Stroke Neurology 09/23/2016 1:29 PM    To contact Stroke Continuity provider, please refer to [email or web address] After hours, contact General Neurology

## 2016-09-24 LAB — CULTURE, BLOOD (ROUTINE X 2)
Culture: NO GROWTH
Culture: NO GROWTH

## 2016-09-25 DIAGNOSIS — I130 Hypertensive heart and chronic kidney disease with heart failure and stage 1 through stage 4 chronic kidney disease, or unspecified chronic kidney disease: Secondary | ICD-10-CM | POA: Diagnosis not present

## 2016-09-25 DIAGNOSIS — E785 Hyperlipidemia, unspecified: Secondary | ICD-10-CM | POA: Diagnosis not present

## 2016-09-25 DIAGNOSIS — I6931 Attention and concentration deficit following cerebral infarction: Secondary | ICD-10-CM | POA: Diagnosis not present

## 2016-09-25 DIAGNOSIS — E1122 Type 2 diabetes mellitus with diabetic chronic kidney disease: Secondary | ICD-10-CM | POA: Diagnosis not present

## 2016-09-25 DIAGNOSIS — E1151 Type 2 diabetes mellitus with diabetic peripheral angiopathy without gangrene: Secondary | ICD-10-CM | POA: Diagnosis not present

## 2016-09-25 DIAGNOSIS — I5022 Chronic systolic (congestive) heart failure: Secondary | ICD-10-CM | POA: Diagnosis not present

## 2016-09-25 DIAGNOSIS — I69311 Memory deficit following cerebral infarction: Secondary | ICD-10-CM | POA: Diagnosis not present

## 2016-09-25 DIAGNOSIS — Z951 Presence of aortocoronary bypass graft: Secondary | ICD-10-CM | POA: Diagnosis not present

## 2016-09-25 DIAGNOSIS — Z794 Long term (current) use of insulin: Secondary | ICD-10-CM | POA: Diagnosis not present

## 2016-09-25 DIAGNOSIS — I69320 Aphasia following cerebral infarction: Secondary | ICD-10-CM | POA: Diagnosis not present

## 2016-09-25 DIAGNOSIS — I255 Ischemic cardiomyopathy: Secondary | ICD-10-CM | POA: Diagnosis not present

## 2016-09-25 DIAGNOSIS — Z89421 Acquired absence of other right toe(s): Secondary | ICD-10-CM | POA: Diagnosis not present

## 2016-09-25 DIAGNOSIS — Z7982 Long term (current) use of aspirin: Secondary | ICD-10-CM | POA: Diagnosis not present

## 2016-09-25 DIAGNOSIS — Z89512 Acquired absence of left leg below knee: Secondary | ICD-10-CM | POA: Diagnosis not present

## 2016-09-25 DIAGNOSIS — Z7984 Long term (current) use of oral hypoglycemic drugs: Secondary | ICD-10-CM | POA: Diagnosis not present

## 2016-09-25 DIAGNOSIS — J449 Chronic obstructive pulmonary disease, unspecified: Secondary | ICD-10-CM | POA: Diagnosis not present

## 2016-09-25 DIAGNOSIS — E1140 Type 2 diabetes mellitus with diabetic neuropathy, unspecified: Secondary | ICD-10-CM | POA: Diagnosis not present

## 2016-09-25 DIAGNOSIS — I2510 Atherosclerotic heart disease of native coronary artery without angina pectoris: Secondary | ICD-10-CM | POA: Diagnosis not present

## 2016-09-25 DIAGNOSIS — N182 Chronic kidney disease, stage 2 (mild): Secondary | ICD-10-CM | POA: Diagnosis not present

## 2016-09-27 ENCOUNTER — Telehealth: Payer: Self-pay | Admitting: *Deleted

## 2016-09-27 DIAGNOSIS — I2510 Atherosclerotic heart disease of native coronary artery without angina pectoris: Secondary | ICD-10-CM | POA: Diagnosis not present

## 2016-09-27 DIAGNOSIS — I69320 Aphasia following cerebral infarction: Secondary | ICD-10-CM | POA: Diagnosis not present

## 2016-09-27 DIAGNOSIS — Z7982 Long term (current) use of aspirin: Secondary | ICD-10-CM | POA: Diagnosis not present

## 2016-09-27 DIAGNOSIS — I5022 Chronic systolic (congestive) heart failure: Secondary | ICD-10-CM | POA: Diagnosis not present

## 2016-09-27 DIAGNOSIS — E1140 Type 2 diabetes mellitus with diabetic neuropathy, unspecified: Secondary | ICD-10-CM | POA: Diagnosis not present

## 2016-09-27 DIAGNOSIS — I69311 Memory deficit following cerebral infarction: Secondary | ICD-10-CM | POA: Diagnosis not present

## 2016-09-27 DIAGNOSIS — Z89421 Acquired absence of other right toe(s): Secondary | ICD-10-CM | POA: Diagnosis not present

## 2016-09-27 DIAGNOSIS — I130 Hypertensive heart and chronic kidney disease with heart failure and stage 1 through stage 4 chronic kidney disease, or unspecified chronic kidney disease: Secondary | ICD-10-CM | POA: Diagnosis not present

## 2016-09-27 DIAGNOSIS — N182 Chronic kidney disease, stage 2 (mild): Secondary | ICD-10-CM | POA: Diagnosis not present

## 2016-09-27 DIAGNOSIS — J449 Chronic obstructive pulmonary disease, unspecified: Secondary | ICD-10-CM | POA: Diagnosis not present

## 2016-09-27 DIAGNOSIS — E785 Hyperlipidemia, unspecified: Secondary | ICD-10-CM | POA: Diagnosis not present

## 2016-09-27 DIAGNOSIS — I6931 Attention and concentration deficit following cerebral infarction: Secondary | ICD-10-CM | POA: Diagnosis not present

## 2016-09-27 DIAGNOSIS — I255 Ischemic cardiomyopathy: Secondary | ICD-10-CM | POA: Diagnosis not present

## 2016-09-27 DIAGNOSIS — E1151 Type 2 diabetes mellitus with diabetic peripheral angiopathy without gangrene: Secondary | ICD-10-CM | POA: Diagnosis not present

## 2016-09-27 DIAGNOSIS — Z794 Long term (current) use of insulin: Secondary | ICD-10-CM | POA: Diagnosis not present

## 2016-09-27 DIAGNOSIS — E1122 Type 2 diabetes mellitus with diabetic chronic kidney disease: Secondary | ICD-10-CM | POA: Diagnosis not present

## 2016-09-27 DIAGNOSIS — Z89512 Acquired absence of left leg below knee: Secondary | ICD-10-CM | POA: Diagnosis not present

## 2016-09-27 DIAGNOSIS — Z951 Presence of aortocoronary bypass graft: Secondary | ICD-10-CM | POA: Diagnosis not present

## 2016-09-27 DIAGNOSIS — Z7984 Long term (current) use of oral hypoglycemic drugs: Secondary | ICD-10-CM | POA: Diagnosis not present

## 2016-09-27 NOTE — Telephone Encounter (Signed)
Anthony Holder, from [facility], called and asked for an order fr speech therapy, due to the patient's recent stroke.  OK per Dr Melford Aase.

## 2016-09-29 DIAGNOSIS — I255 Ischemic cardiomyopathy: Secondary | ICD-10-CM | POA: Diagnosis not present

## 2016-09-29 DIAGNOSIS — Z7984 Long term (current) use of oral hypoglycemic drugs: Secondary | ICD-10-CM | POA: Diagnosis not present

## 2016-09-29 DIAGNOSIS — I69320 Aphasia following cerebral infarction: Secondary | ICD-10-CM | POA: Diagnosis not present

## 2016-09-29 DIAGNOSIS — E1122 Type 2 diabetes mellitus with diabetic chronic kidney disease: Secondary | ICD-10-CM | POA: Diagnosis not present

## 2016-09-29 DIAGNOSIS — I6931 Attention and concentration deficit following cerebral infarction: Secondary | ICD-10-CM | POA: Diagnosis not present

## 2016-09-29 DIAGNOSIS — I130 Hypertensive heart and chronic kidney disease with heart failure and stage 1 through stage 4 chronic kidney disease, or unspecified chronic kidney disease: Secondary | ICD-10-CM | POA: Diagnosis not present

## 2016-09-29 DIAGNOSIS — Z89512 Acquired absence of left leg below knee: Secondary | ICD-10-CM | POA: Diagnosis not present

## 2016-09-29 DIAGNOSIS — N182 Chronic kidney disease, stage 2 (mild): Secondary | ICD-10-CM | POA: Diagnosis not present

## 2016-09-29 DIAGNOSIS — Z794 Long term (current) use of insulin: Secondary | ICD-10-CM | POA: Diagnosis not present

## 2016-09-29 DIAGNOSIS — I5022 Chronic systolic (congestive) heart failure: Secondary | ICD-10-CM | POA: Diagnosis not present

## 2016-09-29 DIAGNOSIS — E1140 Type 2 diabetes mellitus with diabetic neuropathy, unspecified: Secondary | ICD-10-CM | POA: Diagnosis not present

## 2016-09-29 DIAGNOSIS — E785 Hyperlipidemia, unspecified: Secondary | ICD-10-CM | POA: Diagnosis not present

## 2016-09-29 DIAGNOSIS — Z951 Presence of aortocoronary bypass graft: Secondary | ICD-10-CM | POA: Diagnosis not present

## 2016-09-29 DIAGNOSIS — E1151 Type 2 diabetes mellitus with diabetic peripheral angiopathy without gangrene: Secondary | ICD-10-CM | POA: Diagnosis not present

## 2016-09-29 DIAGNOSIS — I69311 Memory deficit following cerebral infarction: Secondary | ICD-10-CM | POA: Diagnosis not present

## 2016-09-29 DIAGNOSIS — Z7982 Long term (current) use of aspirin: Secondary | ICD-10-CM | POA: Diagnosis not present

## 2016-09-29 DIAGNOSIS — J449 Chronic obstructive pulmonary disease, unspecified: Secondary | ICD-10-CM | POA: Diagnosis not present

## 2016-09-29 DIAGNOSIS — Z89421 Acquired absence of other right toe(s): Secondary | ICD-10-CM | POA: Diagnosis not present

## 2016-09-29 DIAGNOSIS — I2510 Atherosclerotic heart disease of native coronary artery without angina pectoris: Secondary | ICD-10-CM | POA: Diagnosis not present

## 2016-09-30 ENCOUNTER — Encounter: Payer: Self-pay | Admitting: Vascular Surgery

## 2016-10-04 DIAGNOSIS — Z89421 Acquired absence of other right toe(s): Secondary | ICD-10-CM | POA: Diagnosis not present

## 2016-10-04 DIAGNOSIS — Z7984 Long term (current) use of oral hypoglycemic drugs: Secondary | ICD-10-CM | POA: Diagnosis not present

## 2016-10-04 DIAGNOSIS — Z951 Presence of aortocoronary bypass graft: Secondary | ICD-10-CM | POA: Diagnosis not present

## 2016-10-04 DIAGNOSIS — Z89512 Acquired absence of left leg below knee: Secondary | ICD-10-CM | POA: Diagnosis not present

## 2016-10-04 DIAGNOSIS — Z794 Long term (current) use of insulin: Secondary | ICD-10-CM | POA: Diagnosis not present

## 2016-10-04 DIAGNOSIS — I255 Ischemic cardiomyopathy: Secondary | ICD-10-CM | POA: Diagnosis not present

## 2016-10-04 DIAGNOSIS — I69311 Memory deficit following cerebral infarction: Secondary | ICD-10-CM | POA: Diagnosis not present

## 2016-10-04 DIAGNOSIS — E1140 Type 2 diabetes mellitus with diabetic neuropathy, unspecified: Secondary | ICD-10-CM | POA: Diagnosis not present

## 2016-10-04 DIAGNOSIS — I2510 Atherosclerotic heart disease of native coronary artery without angina pectoris: Secondary | ICD-10-CM | POA: Diagnosis not present

## 2016-10-04 DIAGNOSIS — E1151 Type 2 diabetes mellitus with diabetic peripheral angiopathy without gangrene: Secondary | ICD-10-CM | POA: Diagnosis not present

## 2016-10-04 DIAGNOSIS — E1122 Type 2 diabetes mellitus with diabetic chronic kidney disease: Secondary | ICD-10-CM | POA: Diagnosis not present

## 2016-10-04 DIAGNOSIS — N182 Chronic kidney disease, stage 2 (mild): Secondary | ICD-10-CM | POA: Diagnosis not present

## 2016-10-04 DIAGNOSIS — J449 Chronic obstructive pulmonary disease, unspecified: Secondary | ICD-10-CM | POA: Diagnosis not present

## 2016-10-04 DIAGNOSIS — I6931 Attention and concentration deficit following cerebral infarction: Secondary | ICD-10-CM | POA: Diagnosis not present

## 2016-10-04 DIAGNOSIS — I130 Hypertensive heart and chronic kidney disease with heart failure and stage 1 through stage 4 chronic kidney disease, or unspecified chronic kidney disease: Secondary | ICD-10-CM | POA: Diagnosis not present

## 2016-10-04 DIAGNOSIS — I5022 Chronic systolic (congestive) heart failure: Secondary | ICD-10-CM | POA: Diagnosis not present

## 2016-10-04 DIAGNOSIS — Z7982 Long term (current) use of aspirin: Secondary | ICD-10-CM | POA: Diagnosis not present

## 2016-10-04 DIAGNOSIS — I69320 Aphasia following cerebral infarction: Secondary | ICD-10-CM | POA: Diagnosis not present

## 2016-10-04 DIAGNOSIS — E785 Hyperlipidemia, unspecified: Secondary | ICD-10-CM | POA: Diagnosis not present

## 2016-10-05 DIAGNOSIS — E1122 Type 2 diabetes mellitus with diabetic chronic kidney disease: Secondary | ICD-10-CM | POA: Diagnosis not present

## 2016-10-05 DIAGNOSIS — I69311 Memory deficit following cerebral infarction: Secondary | ICD-10-CM | POA: Diagnosis not present

## 2016-10-05 DIAGNOSIS — Z89512 Acquired absence of left leg below knee: Secondary | ICD-10-CM | POA: Diagnosis not present

## 2016-10-05 DIAGNOSIS — I6931 Attention and concentration deficit following cerebral infarction: Secondary | ICD-10-CM | POA: Diagnosis not present

## 2016-10-05 DIAGNOSIS — I130 Hypertensive heart and chronic kidney disease with heart failure and stage 1 through stage 4 chronic kidney disease, or unspecified chronic kidney disease: Secondary | ICD-10-CM | POA: Diagnosis not present

## 2016-10-05 DIAGNOSIS — I69320 Aphasia following cerebral infarction: Secondary | ICD-10-CM | POA: Diagnosis not present

## 2016-10-05 DIAGNOSIS — J449 Chronic obstructive pulmonary disease, unspecified: Secondary | ICD-10-CM | POA: Diagnosis not present

## 2016-10-05 DIAGNOSIS — Z7984 Long term (current) use of oral hypoglycemic drugs: Secondary | ICD-10-CM | POA: Diagnosis not present

## 2016-10-05 DIAGNOSIS — E1151 Type 2 diabetes mellitus with diabetic peripheral angiopathy without gangrene: Secondary | ICD-10-CM | POA: Diagnosis not present

## 2016-10-05 DIAGNOSIS — E785 Hyperlipidemia, unspecified: Secondary | ICD-10-CM | POA: Diagnosis not present

## 2016-10-05 DIAGNOSIS — I2510 Atherosclerotic heart disease of native coronary artery without angina pectoris: Secondary | ICD-10-CM | POA: Diagnosis not present

## 2016-10-05 DIAGNOSIS — N182 Chronic kidney disease, stage 2 (mild): Secondary | ICD-10-CM | POA: Diagnosis not present

## 2016-10-05 DIAGNOSIS — Z7982 Long term (current) use of aspirin: Secondary | ICD-10-CM | POA: Diagnosis not present

## 2016-10-05 DIAGNOSIS — Z951 Presence of aortocoronary bypass graft: Secondary | ICD-10-CM | POA: Diagnosis not present

## 2016-10-05 DIAGNOSIS — Z794 Long term (current) use of insulin: Secondary | ICD-10-CM | POA: Diagnosis not present

## 2016-10-05 DIAGNOSIS — I255 Ischemic cardiomyopathy: Secondary | ICD-10-CM | POA: Diagnosis not present

## 2016-10-05 DIAGNOSIS — E1140 Type 2 diabetes mellitus with diabetic neuropathy, unspecified: Secondary | ICD-10-CM | POA: Diagnosis not present

## 2016-10-05 DIAGNOSIS — Z89421 Acquired absence of other right toe(s): Secondary | ICD-10-CM | POA: Diagnosis not present

## 2016-10-05 DIAGNOSIS — I5022 Chronic systolic (congestive) heart failure: Secondary | ICD-10-CM | POA: Diagnosis not present

## 2016-10-06 ENCOUNTER — Encounter: Payer: Self-pay | Admitting: Vascular Surgery

## 2016-10-06 ENCOUNTER — Ambulatory Visit (INDEPENDENT_AMBULATORY_CARE_PROVIDER_SITE_OTHER): Payer: Medicare Other | Admitting: Vascular Surgery

## 2016-10-06 VITALS — BP 107/74 | HR 110 | Temp 97.0°F | Resp 18 | Ht 70.0 in | Wt 185.0 lb

## 2016-10-06 DIAGNOSIS — I7025 Atherosclerosis of native arteries of other extremities with ulceration: Secondary | ICD-10-CM

## 2016-10-06 NOTE — Progress Notes (Signed)
   Patient name: Anthony Holder MRN: [id number] DOB: Apr 17, 1941 Sex: male  REASON FOR VISIT: Ear today for follow-up of his transmetatarsal amputation. Since my last visit with him he was admitted with a stroke on 09/23/2016. He has had some visual disturbances and some weakness since this time.    Current Outpatient Prescriptions  Medication Sig Dispense Refill  . aspirin 325 MG tablet Take 1 tablet (325 mg total) by mouth daily. 30 tablet 2  . baclofen (LIORESAL) 10 MG tablet Take 1 tablet (10 mg total) by mouth daily. 90 tablet 1  . carvedilol (COREG) 12.5 MG tablet Take 1 tablet (12.5 mg total) by mouth 2 (two) times daily with a meal. 60 tablet 0  . Cholecalciferol (VITAMIN D-3) 1000 UNITS CAPS Take 5,000 Units by mouth daily.     [name] CINNAMON PO Take 3 tablets by mouth 2 (two) times daily.    . clopidogrel (PLAVIX) 75 MG tablet Take 1 tablet (75 mg total) by mouth daily. 30 tablet 2  . ezetimibe (ZETIA) 10 MG tablet Take 1 tablet (10 mg total) by mouth daily. 90 tablet 1  . furosemide (LASIX) 40 MG tablet Take 1 tablet 2 x/ day for BP & Fluid 180 tablet 1  . gabapentin (NEURONTIN) 300 MG capsule Take 1 capsule (300 mg total) by mouth 3 (three) times daily. 270 capsule 4  . insulin NPH Human (NOVOLIN N RELION) 100 UNIT/ML injection Inject 17 Units into the skin See admin instructions. Patient states he takes 17 units at night and does not take in the AM unless glucose is above 150.    . metFORMIN (GLUCOPHAGE-XR) 500 MG 24 hr tablet Take 1,000 mg by mouth 2 (two) times daily.     . nitroGLYCERIN (NITROSTAT) 0.4 MG SL tablet Place 1 tablet (0.4 mg total) under the tongue every 5 (five) minutes as needed for chest pain. 25 tablet 11  . potassium chloride SA (K-DUR,KLOR-CON) 20 MEQ tablet Take 1 tablet (20 mEq total) by mouth daily. 90 tablet 3  . tamsulosin (FLOMAX) 0.4 MG CAPS capsule Take 1 capsule (0.4 mg total) by mouth daily. 30 capsule 2  . traMADol  (ULTRAM) 50 MG tablet Take 1 tablet (50 mg total) by mouth every 6 (six) hours as needed for moderate pain. 15 tablet 0  . vitamin B-12 (CYANOCOBALAMIN) 500 MCG tablet Take 500 mcg by mouth daily.      No current facility-administered medications for this visit.      PHYSICAL EXAM: Vitals:   10/06/16 1213  BP: 107/74  Pulse: (!) 110  Resp: 18  Temp: 97 F (36.1 C)  TempSrc: Oral  SpO2: 98%  Weight: 185 lb (83.9 kg)  Height: 5\' 10"  (1.778 m)    GENERAL: The patient is a well-nourished male, in no acute distress. The vital signs are documented above. His open transmetatarsal patient looks quite good today. He continues to have some devitalized tissue on the heel which appears to be superficial. The open amputation looks quite good with excellent granulation base and continued contraction.  MEDICAL ISSUES: Continue local wound care with normal saline wet-to-dry to the open area of his transmetatarsal education. He will be seen again in 6 weeks for continued follow-up   Rosetta Posner, MD [facility] Tel [phone] Pager [phone]

## 2016-10-07 DIAGNOSIS — Z951 Presence of aortocoronary bypass graft: Secondary | ICD-10-CM | POA: Diagnosis not present

## 2016-10-07 DIAGNOSIS — Z794 Long term (current) use of insulin: Secondary | ICD-10-CM | POA: Diagnosis not present

## 2016-10-07 DIAGNOSIS — E1151 Type 2 diabetes mellitus with diabetic peripheral angiopathy without gangrene: Secondary | ICD-10-CM | POA: Diagnosis not present

## 2016-10-07 DIAGNOSIS — I69311 Memory deficit following cerebral infarction: Secondary | ICD-10-CM | POA: Diagnosis not present

## 2016-10-07 DIAGNOSIS — I255 Ischemic cardiomyopathy: Secondary | ICD-10-CM | POA: Diagnosis not present

## 2016-10-07 DIAGNOSIS — Z7982 Long term (current) use of aspirin: Secondary | ICD-10-CM | POA: Diagnosis not present

## 2016-10-07 DIAGNOSIS — E1122 Type 2 diabetes mellitus with diabetic chronic kidney disease: Secondary | ICD-10-CM | POA: Diagnosis not present

## 2016-10-07 DIAGNOSIS — E1140 Type 2 diabetes mellitus with diabetic neuropathy, unspecified: Secondary | ICD-10-CM | POA: Diagnosis not present

## 2016-10-07 DIAGNOSIS — I69320 Aphasia following cerebral infarction: Secondary | ICD-10-CM | POA: Diagnosis not present

## 2016-10-07 DIAGNOSIS — I130 Hypertensive heart and chronic kidney disease with heart failure and stage 1 through stage 4 chronic kidney disease, or unspecified chronic kidney disease: Secondary | ICD-10-CM | POA: Diagnosis not present

## 2016-10-07 DIAGNOSIS — I6931 Attention and concentration deficit following cerebral infarction: Secondary | ICD-10-CM | POA: Diagnosis not present

## 2016-10-07 DIAGNOSIS — Z89512 Acquired absence of left leg below knee: Secondary | ICD-10-CM | POA: Diagnosis not present

## 2016-10-07 DIAGNOSIS — I2510 Atherosclerotic heart disease of native coronary artery without angina pectoris: Secondary | ICD-10-CM | POA: Diagnosis not present

## 2016-10-07 DIAGNOSIS — E785 Hyperlipidemia, unspecified: Secondary | ICD-10-CM | POA: Diagnosis not present

## 2016-10-07 DIAGNOSIS — I5022 Chronic systolic (congestive) heart failure: Secondary | ICD-10-CM | POA: Diagnosis not present

## 2016-10-07 DIAGNOSIS — J449 Chronic obstructive pulmonary disease, unspecified: Secondary | ICD-10-CM | POA: Diagnosis not present

## 2016-10-07 DIAGNOSIS — N182 Chronic kidney disease, stage 2 (mild): Secondary | ICD-10-CM | POA: Diagnosis not present

## 2016-10-07 DIAGNOSIS — Z7984 Long term (current) use of oral hypoglycemic drugs: Secondary | ICD-10-CM | POA: Diagnosis not present

## 2016-10-07 DIAGNOSIS — Z89421 Acquired absence of other right toe(s): Secondary | ICD-10-CM | POA: Diagnosis not present

## 2016-10-08 DIAGNOSIS — E1140 Type 2 diabetes mellitus with diabetic neuropathy, unspecified: Secondary | ICD-10-CM | POA: Diagnosis not present

## 2016-10-08 DIAGNOSIS — Z794 Long term (current) use of insulin: Secondary | ICD-10-CM | POA: Diagnosis not present

## 2016-10-08 DIAGNOSIS — N182 Chronic kidney disease, stage 2 (mild): Secondary | ICD-10-CM | POA: Diagnosis not present

## 2016-10-08 DIAGNOSIS — I5022 Chronic systolic (congestive) heart failure: Secondary | ICD-10-CM | POA: Diagnosis not present

## 2016-10-08 DIAGNOSIS — Z89512 Acquired absence of left leg below knee: Secondary | ICD-10-CM | POA: Diagnosis not present

## 2016-10-08 DIAGNOSIS — I69320 Aphasia following cerebral infarction: Secondary | ICD-10-CM | POA: Diagnosis not present

## 2016-10-08 DIAGNOSIS — J449 Chronic obstructive pulmonary disease, unspecified: Secondary | ICD-10-CM | POA: Diagnosis not present

## 2016-10-08 DIAGNOSIS — E785 Hyperlipidemia, unspecified: Secondary | ICD-10-CM | POA: Diagnosis not present

## 2016-10-08 DIAGNOSIS — I2510 Atherosclerotic heart disease of native coronary artery without angina pectoris: Secondary | ICD-10-CM | POA: Diagnosis not present

## 2016-10-08 DIAGNOSIS — E1151 Type 2 diabetes mellitus with diabetic peripheral angiopathy without gangrene: Secondary | ICD-10-CM | POA: Diagnosis not present

## 2016-10-08 DIAGNOSIS — I255 Ischemic cardiomyopathy: Secondary | ICD-10-CM | POA: Diagnosis not present

## 2016-10-08 DIAGNOSIS — Z951 Presence of aortocoronary bypass graft: Secondary | ICD-10-CM | POA: Diagnosis not present

## 2016-10-08 DIAGNOSIS — I6931 Attention and concentration deficit following cerebral infarction: Secondary | ICD-10-CM | POA: Diagnosis not present

## 2016-10-08 DIAGNOSIS — Z89421 Acquired absence of other right toe(s): Secondary | ICD-10-CM | POA: Diagnosis not present

## 2016-10-08 DIAGNOSIS — I130 Hypertensive heart and chronic kidney disease with heart failure and stage 1 through stage 4 chronic kidney disease, or unspecified chronic kidney disease: Secondary | ICD-10-CM | POA: Diagnosis not present

## 2016-10-08 DIAGNOSIS — I69311 Memory deficit following cerebral infarction: Secondary | ICD-10-CM | POA: Diagnosis not present

## 2016-10-08 DIAGNOSIS — Z7984 Long term (current) use of oral hypoglycemic drugs: Secondary | ICD-10-CM | POA: Diagnosis not present

## 2016-10-08 DIAGNOSIS — E1122 Type 2 diabetes mellitus with diabetic chronic kidney disease: Secondary | ICD-10-CM | POA: Diagnosis not present

## 2016-10-08 DIAGNOSIS — Z7982 Long term (current) use of aspirin: Secondary | ICD-10-CM | POA: Diagnosis not present

## 2016-10-09 ENCOUNTER — Emergency Department (HOSPITAL_COMMUNITY): Payer: Medicare Other

## 2016-10-09 ENCOUNTER — Encounter (HOSPITAL_COMMUNITY): Payer: Self-pay | Admitting: Emergency Medicine

## 2016-10-09 ENCOUNTER — Encounter: Payer: Self-pay | Admitting: Internal Medicine

## 2016-10-09 ENCOUNTER — Inpatient Hospital Stay (HOSPITAL_COMMUNITY)
Admission: EM | Admit: 2016-10-09 | Discharge: 2016-10-20 | DRG: 871 | Disposition: E | Payer: Medicare Other | Attending: Internal Medicine | Admitting: Internal Medicine

## 2016-10-09 ENCOUNTER — Inpatient Hospital Stay (HOSPITAL_COMMUNITY): Payer: Medicare Other

## 2016-10-09 DIAGNOSIS — J690 Pneumonitis due to inhalation of food and vomit: Secondary | ICD-10-CM | POA: Diagnosis present

## 2016-10-09 DIAGNOSIS — I69334 Monoplegia of upper limb following cerebral infarction affecting left non-dominant side: Secondary | ICD-10-CM

## 2016-10-09 DIAGNOSIS — I2510 Atherosclerotic heart disease of native coronary artery without angina pectoris: Secondary | ICD-10-CM | POA: Diagnosis not present

## 2016-10-09 DIAGNOSIS — Z85828 Personal history of other malignant neoplasm of skin: Secondary | ICD-10-CM | POA: Diagnosis not present

## 2016-10-09 DIAGNOSIS — G9340 Encephalopathy, unspecified: Secondary | ICD-10-CM | POA: Diagnosis present

## 2016-10-09 DIAGNOSIS — I110 Hypertensive heart disease with heart failure: Secondary | ICD-10-CM | POA: Diagnosis not present

## 2016-10-09 DIAGNOSIS — I509 Heart failure, unspecified: Secondary | ICD-10-CM | POA: Diagnosis not present

## 2016-10-09 DIAGNOSIS — A419 Sepsis, unspecified organism: Principal | ICD-10-CM | POA: Diagnosis present

## 2016-10-09 DIAGNOSIS — I5023 Acute on chronic systolic (congestive) heart failure: Secondary | ICD-10-CM | POA: Diagnosis not present

## 2016-10-09 DIAGNOSIS — J9621 Acute and chronic respiratory failure with hypoxia: Secondary | ICD-10-CM | POA: Diagnosis not present

## 2016-10-09 DIAGNOSIS — R55 Syncope and collapse: Secondary | ICD-10-CM | POA: Diagnosis present

## 2016-10-09 DIAGNOSIS — J9601 Acute respiratory failure with hypoxia: Secondary | ICD-10-CM

## 2016-10-09 DIAGNOSIS — Z833 Family history of diabetes mellitus: Secondary | ICD-10-CM

## 2016-10-09 DIAGNOSIS — Z7902 Long term (current) use of antithrombotics/antiplatelets: Secondary | ICD-10-CM | POA: Diagnosis not present

## 2016-10-09 DIAGNOSIS — Z794 Long term (current) use of insulin: Secondary | ICD-10-CM | POA: Diagnosis not present

## 2016-10-09 DIAGNOSIS — N183 Chronic kidney disease, stage 3 (moderate): Secondary | ICD-10-CM | POA: Diagnosis present

## 2016-10-09 DIAGNOSIS — R7309 Other abnormal glucose: Secondary | ICD-10-CM | POA: Diagnosis not present

## 2016-10-09 DIAGNOSIS — N179 Acute kidney failure, unspecified: Secondary | ICD-10-CM | POA: Diagnosis present

## 2016-10-09 DIAGNOSIS — I469 Cardiac arrest, cause unspecified: Secondary | ICD-10-CM | POA: Diagnosis not present

## 2016-10-09 DIAGNOSIS — D638 Anemia in other chronic diseases classified elsewhere: Secondary | ICD-10-CM | POA: Diagnosis present

## 2016-10-09 DIAGNOSIS — I350 Nonrheumatic aortic (valve) stenosis: Secondary | ICD-10-CM | POA: Diagnosis not present

## 2016-10-09 DIAGNOSIS — E875 Hyperkalemia: Secondary | ICD-10-CM | POA: Diagnosis present

## 2016-10-09 DIAGNOSIS — N400 Enlarged prostate without lower urinary tract symptoms: Secondary | ICD-10-CM | POA: Diagnosis not present

## 2016-10-09 DIAGNOSIS — Z89512 Acquired absence of left leg below knee: Secondary | ICD-10-CM | POA: Diagnosis not present

## 2016-10-09 DIAGNOSIS — Z87891 Personal history of nicotine dependence: Secondary | ICD-10-CM

## 2016-10-09 DIAGNOSIS — Z7982 Long term (current) use of aspirin: Secondary | ICD-10-CM | POA: Diagnosis not present

## 2016-10-09 DIAGNOSIS — E1165 Type 2 diabetes mellitus with hyperglycemia: Secondary | ICD-10-CM | POA: Diagnosis present

## 2016-10-09 DIAGNOSIS — Z809 Family history of malignant neoplasm, unspecified: Secondary | ICD-10-CM | POA: Diagnosis not present

## 2016-10-09 DIAGNOSIS — I130 Hypertensive heart and chronic kidney disease with heart failure and stage 1 through stage 4 chronic kidney disease, or unspecified chronic kidney disease: Secondary | ICD-10-CM | POA: Diagnosis not present

## 2016-10-09 DIAGNOSIS — I255 Ischemic cardiomyopathy: Secondary | ICD-10-CM | POA: Diagnosis present

## 2016-10-09 DIAGNOSIS — Z8673 Personal history of transient ischemic attack (TIA), and cerebral infarction without residual deficits: Secondary | ICD-10-CM

## 2016-10-09 DIAGNOSIS — E1140 Type 2 diabetes mellitus with diabetic neuropathy, unspecified: Secondary | ICD-10-CM | POA: Diagnosis not present

## 2016-10-09 DIAGNOSIS — E872 Acidosis, unspecified: Secondary | ICD-10-CM

## 2016-10-09 DIAGNOSIS — R0902 Hypoxemia: Secondary | ICD-10-CM

## 2016-10-09 DIAGNOSIS — J449 Chronic obstructive pulmonary disease, unspecified: Secondary | ICD-10-CM | POA: Diagnosis not present

## 2016-10-09 DIAGNOSIS — E1129 Type 2 diabetes mellitus with other diabetic kidney complication: Secondary | ICD-10-CM | POA: Diagnosis present

## 2016-10-09 DIAGNOSIS — E782 Mixed hyperlipidemia: Secondary | ICD-10-CM | POA: Diagnosis present

## 2016-10-09 DIAGNOSIS — I10 Essential (primary) hypertension: Secondary | ICD-10-CM | POA: Diagnosis not present

## 2016-10-09 DIAGNOSIS — E1122 Type 2 diabetes mellitus with diabetic chronic kidney disease: Secondary | ICD-10-CM | POA: Diagnosis not present

## 2016-10-09 DIAGNOSIS — J189 Pneumonia, unspecified organism: Secondary | ICD-10-CM

## 2016-10-09 DIAGNOSIS — E860 Dehydration: Secondary | ICD-10-CM | POA: Diagnosis present

## 2016-10-09 DIAGNOSIS — R6510 Systemic inflammatory response syndrome (SIRS) of non-infectious origin without acute organ dysfunction: Secondary | ICD-10-CM | POA: Diagnosis present

## 2016-10-09 DIAGNOSIS — R112 Nausea with vomiting, unspecified: Secondary | ICD-10-CM | POA: Diagnosis present

## 2016-10-09 DIAGNOSIS — Z951 Presence of aortocoronary bypass graft: Secondary | ICD-10-CM | POA: Diagnosis not present

## 2016-10-09 LAB — BLOOD GAS, ARTERIAL
Acid-base deficit: 7.8 mmol/L — ABNORMAL HIGH (ref 0.0–2.0)
Bicarbonate: 16.2 mmol/L — ABNORMAL LOW (ref 20.0–28.0)
DRAWN BY: 419771
O2 Content: 2 L/min
O2 Saturation: 97.3 %
PATIENT TEMPERATURE: 98.6
PO2 ART: 96.2 mmHg (ref 83.0–108.0)
pCO2 arterial: 27.4 mmHg — ABNORMAL LOW (ref 32.0–48.0)
pH, Arterial: 7.39 (ref 7.350–7.450)

## 2016-10-09 LAB — PROCALCITONIN: Procalcitonin: 0.18 ng/mL

## 2016-10-09 LAB — BASIC METABOLIC PANEL
Anion gap: 14 (ref 5–15)
BUN: 37 mg/dL — AB (ref 6–20)
CALCIUM: 8.8 mg/dL — AB (ref 8.9–10.3)
CHLORIDE: 101 mmol/L (ref 101–111)
CO2: 16 mmol/L — AB (ref 22–32)
CREATININE: 1.79 mg/dL — AB (ref 0.61–1.24)
GFR calc non Af Amer: 36 mL/min — ABNORMAL LOW (ref >60)
GFR, EST AFRICAN AMERICAN: 41 mL/min — AB (ref >60)
GLUCOSE: 391 mg/dL — AB (ref 65–99)
Potassium: 5.2 mmol/L — ABNORMAL HIGH (ref 3.5–5.1)
Sodium: 131 mmol/L — ABNORMAL LOW (ref 135–145)

## 2016-10-09 LAB — DIFFERENTIAL
BASOS PCT: 0 %
Basophils Absolute: 0 10*3/uL (ref 0.0–0.1)
EOS ABS: 0 10*3/uL (ref 0.0–0.7)
EOS PCT: 0 %
Lymphocytes Relative: 6 %
Lymphs Abs: 0.9 10*3/uL (ref 0.7–4.0)
MONO ABS: 0.9 10*3/uL (ref 0.1–1.0)
MONOS PCT: 6 %
Neutro Abs: 12.8 10*3/uL — ABNORMAL HIGH (ref 1.7–7.7)
Neutrophils Relative %: 88 %

## 2016-10-09 LAB — GLUCOSE, CAPILLARY
GLUCOSE-CAPILLARY: 293 mg/dL — AB (ref 65–99)
Glucose-Capillary: 308 mg/dL — ABNORMAL HIGH (ref 65–99)

## 2016-10-09 LAB — HEPATIC FUNCTION PANEL
ALK PHOS: 51 U/L (ref 38–126)
ALT: 16 U/L — AB (ref 17–63)
AST: 38 U/L (ref 15–41)
Albumin: 3.6 g/dL (ref 3.5–5.0)
BILIRUBIN INDIRECT: 1.2 mg/dL — AB (ref 0.3–0.9)
BILIRUBIN TOTAL: 1.7 mg/dL — AB (ref 0.3–1.2)
Bilirubin, Direct: 0.5 mg/dL (ref 0.1–0.5)
Total Protein: 7 g/dL (ref 6.5–8.1)

## 2016-10-09 LAB — CK: Total CK: 36 U/L — ABNORMAL LOW (ref 49–397)

## 2016-10-09 LAB — CBC
HCT: 29.5 % — ABNORMAL LOW (ref 39.0–52.0)
Hemoglobin: 9.4 g/dL — ABNORMAL LOW (ref 13.0–17.0)
MCH: 28.1 pg (ref 26.0–34.0)
MCHC: 31.9 g/dL (ref 30.0–36.0)
MCV: 88.3 fL (ref 78.0–100.0)
PLATELETS: 236 10*3/uL (ref 150–400)
RBC: 3.34 MIL/uL — AB (ref 4.22–5.81)
RDW: 18 % — AB (ref 11.5–15.5)
WBC: 14.7 10*3/uL — ABNORMAL HIGH (ref 4.0–10.5)

## 2016-10-09 LAB — I-STAT CG4 LACTIC ACID, ED: Lactic Acid, Venous: 5.27 mmol/L (ref 0.5–1.9)

## 2016-10-09 LAB — LACTIC ACID, PLASMA: LACTIC ACID, VENOUS: 3.8 mmol/L — AB (ref 0.5–1.9)

## 2016-10-09 LAB — PROTIME-INR
INR: 1.5
Prothrombin Time: 18.2 seconds — ABNORMAL HIGH (ref 11.4–15.2)

## 2016-10-09 LAB — I-STAT TROPONIN, ED: Troponin i, poc: 0.02 ng/mL (ref 0.00–0.08)

## 2016-10-09 LAB — LIPASE, BLOOD
LIPASE: 21 U/L (ref 11–51)
Lipase: 18 U/L (ref 11–51)

## 2016-10-09 LAB — CBG MONITORING, ED: GLUCOSE-CAPILLARY: 350 mg/dL — AB (ref 65–99)

## 2016-10-09 LAB — APTT: APTT: 34 s (ref 24–36)

## 2016-10-09 LAB — BRAIN NATRIURETIC PEPTIDE: B NATRIURETIC PEPTIDE 5: 910.1 pg/mL — AB (ref 0.0–100.0)

## 2016-10-09 LAB — MRSA PCR SCREENING: MRSA BY PCR: POSITIVE — AB

## 2016-10-09 MED ORDER — MUPIROCIN 2 % EX OINT: 1.0000 "application " | TOPICAL_OINTMENT | Freq: Two times a day (BID) | CUTANEOUS | Status: DC

## 2016-10-09 MED ORDER — DOCUSATE SODIUM 100 MG PO CAPS: 100.0000 mg | ORAL_CAPSULE | Freq: Every day | ORAL | Status: DC

## 2016-10-09 MED ORDER — ALBUTEROL SULFATE (2.5 MG/3ML) 0.083% IN NEBU
5.0000 mg | INHALATION_SOLUTION | Freq: Once | RESPIRATORY_TRACT | Status: AC
  Administered 2016-10-09: 5 mg via RESPIRATORY_TRACT
  Filled 2016-10-09: qty 6

## 2016-10-09 MED ORDER — INSULIN ASPART 100 UNIT/ML ~~LOC~~ SOLN
0.0000 [IU] | Freq: Three times a day (TID) | SUBCUTANEOUS | Status: DC
  Administered 2016-10-09: 5 [IU] via SUBCUTANEOUS

## 2016-10-09 MED ORDER — PIPERACILLIN-TAZOBACTAM 3.375 G IVPB
3.3750 g | Freq: Three times a day (TID) | INTRAVENOUS | Status: DC
  Filled 2016-10-09 (×3): qty 50

## 2016-10-09 MED ORDER — CINNAMON 500 MG PO CAPS: ORAL_CAPSULE | Freq: Two times a day (BID) | ORAL | Status: DC

## 2016-10-09 MED ORDER — LEVALBUTEROL HCL 1.25 MG/0.5ML IN NEBU: 1.2500 mg | INHALATION_SOLUTION | Freq: Four times a day (QID) | RESPIRATORY_TRACT | Status: DC

## 2016-10-09 MED ORDER — INSULIN ASPART 100 UNIT/ML ~~LOC~~ SOLN
10.0000 [IU] | Freq: Once | SUBCUTANEOUS | Status: AC
  Administered 2016-10-09: 10 [IU] via INTRAVENOUS
  Filled 2016-10-09: qty 1

## 2016-10-09 MED ORDER — LEVALBUTEROL HCL 1.25 MG/0.5ML IN NEBU: 1.2500 mg | INHALATION_SOLUTION | Freq: Four times a day (QID) | RESPIRATORY_TRACT | Status: DC | PRN

## 2016-10-09 MED ORDER — VITAMIN D 1000 UNITS PO TABS
5000.0000 [IU] | ORAL_TABLET | Freq: Every day | ORAL | Status: DC
  Administered 2016-10-09: 5000 [IU] via ORAL
  Filled 2016-10-09: qty 5

## 2016-10-09 MED ORDER — CHLORHEXIDINE GLUCONATE CLOTH 2 % EX PADS: 6.0000 | MEDICATED_PAD | Freq: Every day | CUTANEOUS | Status: DC

## 2016-10-09 MED ORDER — PIPERACILLIN-TAZOBACTAM 3.375 G IVPB 30 MIN
3.3750 g | INTRAVENOUS | Status: AC
  Administered 2016-10-09: 3.375 g via INTRAVENOUS
  Filled 2016-10-09: qty 50

## 2016-10-09 MED ORDER — CLOPIDOGREL BISULFATE 75 MG PO TABS
75.0000 mg | ORAL_TABLET | Freq: Every day | ORAL | Status: DC
  Administered 2016-10-09: 75 mg via ORAL
  Filled 2016-10-09: qty 1

## 2016-10-09 MED ORDER — INSULIN NPH (HUMAN) (ISOPHANE) 100 UNIT/ML ~~LOC~~ SUSP
17.0000 [IU] | Freq: Every day | SUBCUTANEOUS | Status: DC | PRN
  Administered 2016-10-09: 17 [IU] via SUBCUTANEOUS

## 2016-10-09 MED ORDER — CARVEDILOL 12.5 MG PO TABS
12.5000 mg | ORAL_TABLET | Freq: Two times a day (BID) | ORAL | Status: DC
  Administered 2016-10-09: 12.5 mg via ORAL
  Filled 2016-10-09: qty 1

## 2016-10-09 MED ORDER — NITROGLYCERIN 0.4 MG SL SUBL: 0.4000 mg | SUBLINGUAL_TABLET | SUBLINGUAL | Status: DC | PRN

## 2016-10-09 MED ORDER — GABAPENTIN 300 MG PO CAPS
300.0000 mg | ORAL_CAPSULE | Freq: Three times a day (TID) | ORAL | Status: DC
Start: 2016-10-09 — End: 2016-10-09
  Administered 2016-10-09: 300 mg via ORAL
  Filled 2016-10-09: qty 1

## 2016-10-09 MED ORDER — VANCOMYCIN HCL IN DEXTROSE 1-5 GM/200ML-% IV SOLN
1000.0000 mg | Freq: Once | INTRAVENOUS | Status: AC
  Administered 2016-10-09: 1000 mg via INTRAVENOUS
  Filled 2016-10-09: qty 200

## 2016-10-09 MED ORDER — TAMSULOSIN HCL 0.4 MG PO CAPS
0.4000 mg | ORAL_CAPSULE | Freq: Every day | ORAL | Status: DC
  Administered 2016-10-09: 0.4 mg via ORAL
  Filled 2016-10-09: qty 1

## 2016-10-09 MED ORDER — INSULIN NPH (HUMAN) (ISOPHANE) 100 UNIT/ML ~~LOC~~ SUSP
17.0000 [IU] | Freq: Every day | SUBCUTANEOUS | Status: DC
  Filled 2016-10-09: qty 10

## 2016-10-09 MED ORDER — EZETIMIBE 10 MG PO TABS
10.0000 mg | ORAL_TABLET | Freq: Every day | ORAL | Status: DC
  Administered 2016-10-09: 10 mg via ORAL
  Filled 2016-10-09: qty 1

## 2016-10-09 MED ORDER — HEPARIN SODIUM (PORCINE) 5000 UNIT/ML IJ SOLN: 5000.0000 [IU] | Freq: Three times a day (TID) | INTRAMUSCULAR | Status: DC

## 2016-10-09 MED ORDER — VANCOMYCIN HCL 10 G IV SOLR
1250.0000 mg | INTRAVENOUS | Status: DC
  Filled 2016-10-09: qty 1250

## 2016-10-09 MED ORDER — SODIUM CHLORIDE 0.9 % IV BOLUS (SEPSIS)
1000.0000 mL | Freq: Once | INTRAVENOUS | Status: AC
  Administered 2016-10-09: 1000 mL via INTRAVENOUS

## 2016-10-09 MED ORDER — ASPIRIN 325 MG PO TABS
325.0000 mg | ORAL_TABLET | Freq: Every day | ORAL | Status: DC
  Administered 2016-10-09: 325 mg via ORAL
  Filled 2016-10-09: qty 1

## 2016-10-09 MED ORDER — BACLOFEN 10 MG PO TABS
10.0000 mg | ORAL_TABLET | Freq: Every day | ORAL | Status: DC
  Administered 2016-10-09: 10 mg via ORAL
  Filled 2016-10-09: qty 1

## 2016-10-09 MED ORDER — SODIUM POLYSTYRENE SULFONATE 15 GM/60ML PO SUSP: 15.0000 g | Freq: Once | ORAL | Status: DC

## 2016-10-09 MED ORDER — HYDROXYZINE HCL 50 MG/ML IM SOLN
25.0000 mg | Freq: Four times a day (QID) | INTRAMUSCULAR | Status: DC | PRN
  Filled 2016-10-09: qty 0.5

## 2016-10-09 MED ORDER — SODIUM CHLORIDE 0.9 % IV SOLN: INTRAVENOUS | Status: DC

## 2016-10-09 MED ORDER — CYANOCOBALAMIN 500 MCG PO TABS
500.0000 ug | ORAL_TABLET | Freq: Every day | ORAL | Status: DC
  Administered 2016-10-09: 500 ug via ORAL
  Filled 2016-10-09: qty 1

## 2016-10-09 MED ORDER — TRAMADOL HCL 50 MG PO TABS: 50.0000 mg | ORAL_TABLET | Freq: Four times a day (QID) | ORAL | Status: DC | PRN

## 2016-10-12 ENCOUNTER — Ambulatory Visit: Payer: Self-pay | Admitting: Internal Medicine

## 2016-10-14 LAB — CULTURE, BLOOD (ROUTINE X 2)
CULTURE: NO GROWTH
Culture: NO GROWTH

## 2016-10-20 NOTE — ED Triage Notes (Signed)
Pt transported by [facility] from home, per family pt began feeling poorly last evening, emesis x 3 at home. EMS state pt was found supine with emesis directly under head and saturating shirt.  Pt found to have 02 sat 83% on RA, quickly up to 96% on 2L [place].  Pt very pale on arrival. IV est by EMS, Zofran 4mg  IVP given, CBG 517.

## 2016-10-20 NOTE — ED Notes (Signed)
Family at bedside speaking with Dr. Leonides Schanz
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
## 2016-10-20 NOTE — Progress Notes (Signed)
Patient in nuclear med. Received call from central telemetry @ 1215. Patient's heart rate in 30's. This nurse called nuclear med to check on patient. This nurse spoke to [employer] (nulcear med tech). Malachy Mood stated patient was responding and fine. @ 1222 central telemetry called and stated patient was asystole on monitor. This nurse went to nuclear med and requested patient to be pulled from test. Patient had no pulse started chest compressions, Cheryl called for rapid response/obatined rapid response. Dr. Dyann Kief notified. No pulse noted and Dr. Dyann Kief pronounced deceased at 10.

## 2016-10-20 NOTE — Progress Notes (Signed)
   10/12/2016 1300  Clinical Encounter Type  Visited With Patient and family together  Visit Type Death  Referral From Nurse  Consult/Referral To Chaplain  Spiritual Encounters  Spiritual Needs Grief support  Stress Factors  Patient Stress Factors Other (Comment) (Death)  Family Stress Factors Major life changes;Family relationships  Called by medical staff to discuss situation to family. MD discuss Pt's demise with family. Offered grief support to family and provided Pt placement card to family on funeral arrangements.
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
## 2016-10-20 NOTE — Progress Notes (Signed)
Pharmacy Antibiotic Note  Anthony Holder is a 75 y.o. male admitted on 10/12/2016 with sepsis.  Pharmacy has been consulted for Vancomycin and Zosyn dosing.  Zosyn 3.375gm and Vanc 1gm IV given in ED ~0400  Plan: Zosyn 3.375gm IV q8h -  doses over 4 hours Vancomycin 1250mg  IV q24h Will f/u micro data, renal function, and pt's clinical condition Vanc trough prn  Height: 5\' 10"  (177.8 cm) Weight: 185 lb (83.9 kg) IBW/kg (Calculated) : 73  Temp (24hrs), Avg:98.4 F (36.9 C), Min:97.8 F (36.6 C), Max:99 F (37.2 C)   Recent Labs Lab 12-Oct-2016 0300 October 12, 2016 0316  WBC 14.7*  --   CREATININE 1.79*  --   LATICACIDVEN  --  5.27*    Estimated Creatinine Clearance: 37.4 mL/min (by C-G formula based on SCr of 1.79 mg/dL (H)).    Allergies  Allergen Reactions  . Statins Other (See Comments)    Patient prefers to not take statins    Antimicrobials this admission: 10/21 Vanc >>  10/21 Zosyn >>   Dose adjustments this admission: n/a  Microbiology results: 10/21 BCx x2:   UCx:    Sputum:    Thank you for allowing pharmacy to be a part of this patient's care.  Sherlon Handing, PharmD, BCPS Clinical pharmacist, pager [phone] October 12, 2016 7:14 AM

## 2016-10-20 NOTE — Progress Notes (Signed)
CRITICAL VALUE ALERT  Critical value received:  Lactic Acid 3.8  Date of notification:  14-Oct-2016  Time of notification:  0910  Critical value read back:Yes.    Nurse who received alert:  Lester Kinsman RN   MD notified (1st page):  Dr. Dyann Kief  Time of first page:  0911  MD notified (2nd page):  Time of second page:  Responding MD:  Dr. Dyann Kief  Time MD responded:  [phone]

## 2016-10-20 NOTE — Progress Notes (Signed)
PT Cancellation Note  Patient Details Name: Anthony Holder MRN: [id number] DOB: Dec 11, 1941   Cancelled Treatment:    Reason Eval/Treat Not Completed: Patient at procedure or test/unavailable. Pt off of floor at nuclear med for testing for possible PE per nurse's report. PT will continue to f/u with pt as appropriate.   [place] 10/24/2016, 12:07 PM Sherie Don, [place], DPT [phone]

## 2016-10-20 NOTE — Progress Notes (Signed)
Patient seen and examined. Admitted after midnight after episode of syncope and subsequent AMS. Recently admitted due to left PCA CVA and also status post right foot trans-metatarsal amputation. CXR with concerns for vascular congestion, unable to r/o superimposed HCAP. Patient with significant elevation on lactic acid, was tachycardic, with elevated WBC's and and with tachypnea on admission. Currently hemodynamically stable, denies CP and SOB; oriented X2 and in no distress.  Please refer to H&P written by Dr. Blaine Hamper for further info/details on admission.  Plan: -high concerns for seizure activity as cause for syncope -no fever, no cough and no complaints of SOB -neurology consulted -will follow clinical response -will check CK level -will continue antibiotics for now -repeat CXR intermittently -daily weights and strict I's and O's -continue PRN nebulizer, pulm hygiene and will add flutter valve   Barton Dubois [id number]

## 2016-10-20 NOTE — Discharge Summary (Signed)
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 male with medical history significant of hypertension, hyperlipidemia, diabetes mellitus, COPD, CAD, s/p of CABG, recent CVA with left arm weakness, BPH, severe aortic stenosis, spinal cord stenosis, CKD-II, s/p of left BKA, recent right foot transmetatarsal amputation, who presents with shortness of breath and syncope.  Per patient's family, patient stopped breathing at about 2 AM. Wife performed PCR, patient woke up after 1 to 2 min. After waking up, pt complains of shortness breast and chest heaviness, but no chest pain. He has cough with clear mucus production. Patient also has generalized weakness, nausea and vomited 3 times without blood in vomitus. He denies abdominal pain or diarrhea. Patient states that he has burning on urination and difficult urinating sometimes. Pt had wound in right foot heel and surgical wound from recent right foot transmetatarsal amputation. He followed up by [name]. Pt initially alert and oriented x 3. Per RN, pt went to do CT-head, and becomes confused and combative, yelling out.   ED  Course: pt was found to have WBC 14.7, lactate 5.27, negative troponin, BNP 910.1, potassium 5.2, worsening renal function, pending urinalysis, temperature 99, tachycardia, tachypnea, oxygen saturation 83% on room air, chest x-ray showed pulmonary edema.   Hospital Course:  Acute respiratory failure with hypoxia: Etiology is not clear. Differential diagnosis include PE given recent surgery, aspiration pneumonia/pneumonitis, CHF exacerbation given elevated BNP and chest x-ray findings of pulmonary edema. Since patient has elevated lactic acid, and WBC's and hypoxia on presentation; SIRS/possible sepsis was also part of the differential. -gentle IVF's challenge given, diuretics initially hold given sepsis concerns, soft BP and acute on chronic renal failure. -patient was initiated on sepsis protocol and started on broad spectrum antibiotics. -while having nuclear medicine test done to r/o PE patient ended experiencing a cardiac arrest and expired. -time of death 12:30 -given his low EF will imagine arrhyhtmia to be high on differential; vs PE.  Syncope: Etiology is not clear. Differential diagnosis include PE as mentioned above, AS/dysrhythmias with such low EF, Seizure activity, orthostatic status given very PO intake and hydration recently, and intracranial bleeding (patient is taking aspirin to Plavix for recent ischemic stroke). Patient does not have chest pain, was troponin negative, less likely to have ACS. -V/Q scan to r/o PE ordered -CT-head done and unremarkable  -patient expired on 2023-10-21 at 12:30 pm  Acute encephalopathy: Per RN, pt went to do CT-head, and becomes confused and combative, yelling out. Etiology is not clear, likely multifactorial etiology, including delirium, hypoxia, electrolyte disturbance, worsening renal function, ?? seizure and possible sepsis.  -plan was to r/o out PE and a CT  head was done ruling out acute ICH and new abnormalities from stroke. -PCCM was  consulted -Frequent neuro checks ordered -Neurology was consulted  -therapy to Treat underlying issues initiated  DM-II: Last A1c 6.9 on 09/19/16, well controled. Patient was taking NPH insulin and metformin at home. Blood sugar 391 on admission  -plan was to continue NPH and use SSI -Metformin was held   HLD: Last LDL was 69 on 09/19/16 -plan was to Continue home medications: Zetia -patient intolerant to statins   Hx of CAD: s/p of CABG. No CP -plan was to continue aspirin, Coreg and Zetia  History of CVA (cerebrovascular accident) -continue aspirin and plavix -no new abnormalities seen on CT head -case discussed with neurology -no focal neurologic deficit appreciated on admission  HTN: -continue Coreg -held lasix due to possible sepsis.  Acute on chronic systolic CHF (congestive heart failure) [facility]): Patient has leg edema, elevated BNP and vascular congestion/pulm edem on chest x-ray, indicating CHF exacerbation. Given patient is possibly septic and with significant elevated lactate, will hold Lasix initially. -aspirin and his Coreg were anticipated to be continue -lasix was on hold initially and with plans to resume as sepsis condition improves.  SIRS/Possible sepsis: Patient has leukocytosis and elevated lactate. Patient may have aspiration pneumonia given his SOB. UA didn't showed signs of infection. Culture pending  -IV vancomycin and Zosyn were started in the ED and continue for empiric coverage -IVF: 1L of NS bolus in ED, followed by 50 cc/h (patient has Ef of 20-25%, limiting aggressive IV fluids treatment). -BP remains stable -case discussed with PCCM -unfortunately, and without presentation of worsening septic features, fever or any acute abnormalities indicating decompensation patient ended having a cardiac arrest and passing away on 10/21 at 12:30 pm  Hyperkalemia: K=5.2 -Kayexalate 15 g 1 -patient also received albuterol nebulizer  AoCKD-III: Baseline  Cre is 1.2, pt's Cre was 1.79 on admission. Likely due to prerenal secondary to dehydration and continue use of NSAIDs as an outpatient. -received very gentle IVF's resuscitation -nephrotoxic drugs held -patient ended expiring around 12:30pm on Oct 14, 2016  Time: 35 minutes   Signed:  Barton Dubois  [facility] 10-14-2016, 1:47 PM
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
## 2016-10-20 NOTE — ED Notes (Signed)
Report called to 4E. Pt to have head CT before going to floor
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
## 2016-10-20 DEATH — deceased

## 2016-10-22 ENCOUNTER — Ambulatory Visit: Payer: Medicare Other | Admitting: Cardiology

## 2016-11-02 ENCOUNTER — Ambulatory Visit: Payer: Self-pay | Admitting: Neurology

## 2016-11-16 ENCOUNTER — Ambulatory Visit: Payer: Medicare Other | Admitting: Vascular Surgery

## 2017-01-14 ENCOUNTER — Ambulatory Visit: Payer: Self-pay | Admitting: Internal Medicine

## 2017-05-11 IMAGING — CT CT ANGIO HEAD
1 of 10 series · 1 of 33 positions shown · IV contrast (Iohexol (Omnipaque 350))
Comparison: Brain MRI 7500 hours today, and earlier.

ADDENDUM:
Study discussed by telephone with Dr. BENFRIED ISAAK on 09/19/2016 at
2029 hours.
CLINICAL DATA: 75-year-old male with left PCA infarct discovered on
MRI for acute confusion. Initial encounter.

EXAM:
CT ANGIOGRAPHY HEAD AND NECK
TECHNIQUE: Multidetector CT imaging of the head and neck was performed using
the standard protocol during bolus administration of intravenous
contrast. Multiplanar CT image reconstructions and MIPs were
obtained to evaluate the vascular anatomy. Carotid stenosis
measurements (when applicable) are obtained utilizing NASCET
criteria, using the distal internal carotid diameter as the
denominator.
CONTRAST:  50 mL Isovue 370

[Series 200: locator · axial · 0.49mm/px · 1 of 1 slices shown]
[im 1/1  soft-tissue]
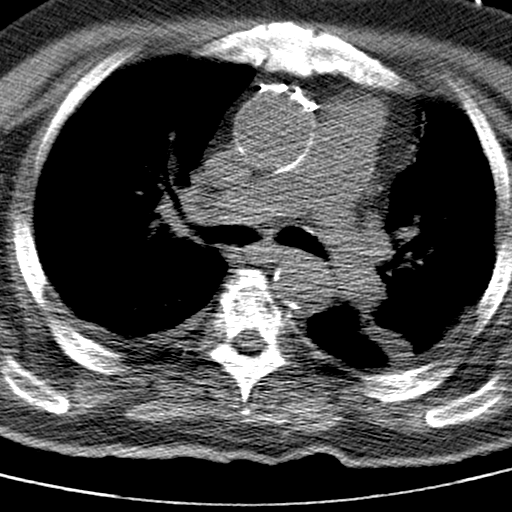

[1 of 33 positions shown; findings below may reference images not displayed]

FINDINGS: CTA NECK

Skeleton: Previous cervical C3-C4 absent dentition. No acute osseous
abnormality identified.

Visualized paranasal sinuses and mastoids are stable and well
pneumatized. And C4-C5 ACDF. Previous median sternotomy.

Upper chest: Small layering bilateral pleural effusions, that on the
left also tracks into the major fissure. Septal thickening and
mosaic attenuation in the bilateral lung parenchyma. Right
peritracheal lymphadenopathy with individual nodes measuring 16 mm.
Anterior carina nodes measure up to 13 mm. Sequelae of CABG.

Other neck: Partially calcified 16 mm left thyroid hypodense nodule
which meets consensus criteria for ultrasound follow-up.

Negative larynx, pharynx, parapharyngeal spaces, retropharyngeal
space, sublingual space, submandibular glands and parotid glands. No
cervical lymphadenopathy.

Aortic arch: Calcified aortic atherosclerosis. Three vessel arch
configuration. Soft arch atherosclerosis as well.

Right carotid system: Right CCA soft and calcified plaque resulting
in stenosis of less than 50 % with respect to the distal vessel.
Soft plaque continues in the posterior right CCA. There is soft
plaque with less than 50% stenosis just proximal to the right
carotid bifurcation. At the bifurcation there is soft and calcified
plaque. No right ICA origin or bulb stenosis. There is soft and
calcified plaque at the right skullbase resulting in less than 50 %
stenosis with respect to the distal vessel.

Left carotid system: No left CCA origin stenosis. Soft plaque
intermittently in the left CCA proximal to the bifurcation. Soft and
calcified plaque at the bifurcation resulting in stenosis in the
bulb up to 55-60 % with respect to the distal vessel. See series
401, image 87. There is soft plaque in the left ICA at the skullbase
resulting in. Less than 50 % stenosis with respect to the distal
vessel

Vertebral arteries:

No proximal right subclavian artery stenosis despite soft and
calcified plaque. The right vertebral artery origin is occluded.
There is a calcification at the origin. The right vertebral artery
is occluded up to the C2-C3 level where it appears to be mildly
reconstituted from muscular branches, but is highly diminutive and
terminates outside the skull.

Soft plaque in the proximal left subclavian artery with less than 50
% stenosis with respect to the distal vessel. Soft and calcified
plaque at the left vertebral artery origin with severe stenosis
(series 405, image 121). Tortuous left V1 segment. Minimal plaque in
the left V2 segment. Mild left V3 segment plaque without significant
stenosis.

CTA HEAD

Posterior circulation: Patent distal left vertebral artery which
appears somewhat dominant has no stenosis to the basilar. There is
mild left V4 segment plaque. Both a ICAs appear dominant.

The distal right vertebral artery is occluded.

There is moderate basilar artery irregularity and mild stenosis of
the basilar just distal to the AICA origins. The basilar remains
patent. The SCA and left PCA origins are normal. The left posterior
communicating artery is present. The left PCA is occluded in the
distal P2 segment with no reconstituted flow.

The right P1 is occluded but the right posterior communicating
artery is patent although moderately to severely irregular and
stenotic. Still, the right PCA branches are patent but attenuated.

Anterior circulation: The right ICA siphon is patent but there is
radiographic string sign stenosis of the siphon in the vertical
petrous segment (series 402, image 249 and at least moderate
superimposed stenosis in the cavernous and supraclinoid segments.
Still, the right ICA terminus is patent. The right posterior
communicating and right ophthalmic artery origins are normal.

The left ICA siphon is patent also with moderate to severe
atherosclerosis. There is moderate stenosis in the left cavernous
segment and severe stenosis in the left supraclinoid ICA related to
confluent calcified plaque. In fat, there might be a short segment
occlusion of the supraclinoid ICA segment, with the left ICA
terminus supplied from the left posterior communicating artery.

Nevertheless, both carotid termini are patent. The left MCA and ACA
origins are within normal limits. The right ACA A1 segment is
diminutive or absent. The anterior communicating artery and
bilateral ACA branches are within normal limits.

The right MCA origin is mildly irregular and stenotic. The right M1
remains patent. The right MCA bifurcation is patent. There is up to
moderate stenosis at the origin of the anterior right MCA M2 branch.
Other visualized right MCA branches are within normal limits.

The left MCA proximal M1 segment is mildly irregular and stenotic.
There is moderate irregularity and stenosis at the left MCA
bifurcation. The left M2 branches remain patent, and are within
normal limits.

Venous sinuses: Negative.

Anatomic variants: Dominant left ACA A1 segment.

Delayed phase: Left PCA infarct remain subtle by CT. No abnormal
enhancement identified.

Review of the MIP images confirms the above findings
IMPRESSION: 1. Left PCA occlusion in the distal P2 segment, corresponding to the
acute left PCA infarct.
2. Right Vertebral Artery occlusion (short segment reconstitution
from muscular branches in the distal V2 segment).
3. Superimposed Severe atherosclerotic stenosis at the origin of the
Left Vertebral Artery which is the Crispin supply to the Basilar.
4. Right PCA P1 segment occlusion and poor reconstitution of the
right PCA branches from the right posterior communicating artery
which is highly atherosclerotic and stenotic.
5. Radiographic string sign stenosis of the Right ICA siphon in the
distal petrous segment. At least moderate tandem stenoses from dense
calcified plaque in the right cavernous and supraclinoid ICA
segments, but the right ICA terminus remains patent.
6. High-grade stenosis versus short segment occlusion of the Left
ICA supraclinoid segment. The left ICA terminus is patent but might
be supplied from the left posterior communicating artery.
7. Mild to moderate bilateral MCA irregularity and stenosis,
including moderate stenosis at the left MCA bifurcation.
8. Left greater than right carotid bifurcation atherosclerosis. Up
to 60% left ICA bulb stenosis.
9. Bilateral pleural effusions with pulmonary ground-glass opacity
and reactive appearing superior mediastinal lymphadenopathy. This
constellation might reflect congestive heart failure with pulmonary
edema.
10. Stable CT appearance of the brain.

## 2017-05-31 IMAGING — CT CT HEAD W/O CM
4 series · 16 of 47 positions shown, 18 images · non-contrast
Comparison: 09/19/2016

CLINICAL DATA: AMS - PATIENT FOUND LAYING SUPINE WITH EMESIS AROUND
HIS HEAD. PATIENT WAS CALM UPON ARRIVAL TO CT, DURING AND AFTER CT
HE BECAME EVEN MORE AGITATED (NEW NEURO CHANGE). HISTORY OF OLD
STROKE.

EXAM:
CT HEAD WITHOUT CONTRAST
TECHNIQUE: Contiguous axial images were obtained from the base of the skull
through the vertex without intravenous contrast.

[Series 2: head without · axial · non-contrast · 0.43mm/px · z∈[-44,+76]mm · 7 of 33 slices shown, 9 images]
[im 5/33  brain]
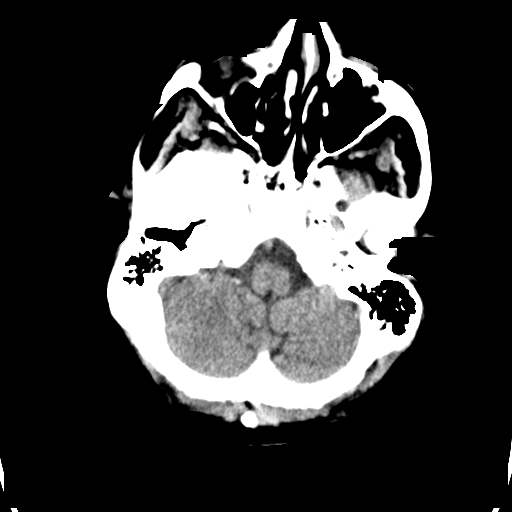
[im 5/33  bone]
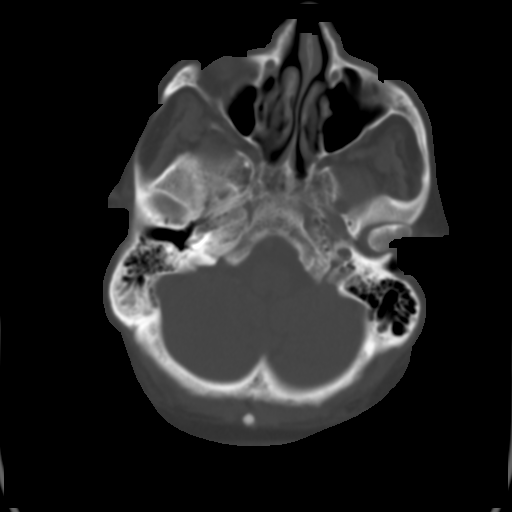
[im 9/33  brain]
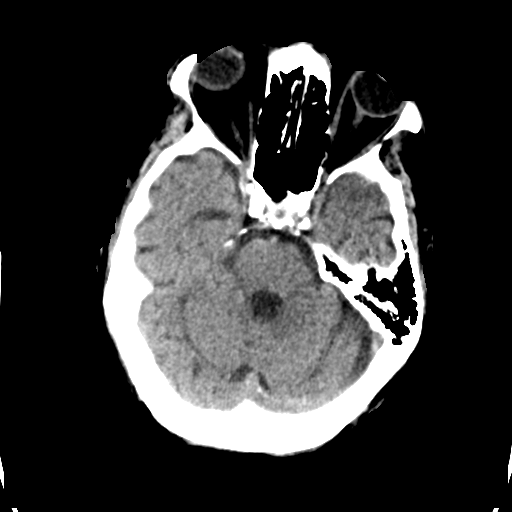
[im 13/33  brain]
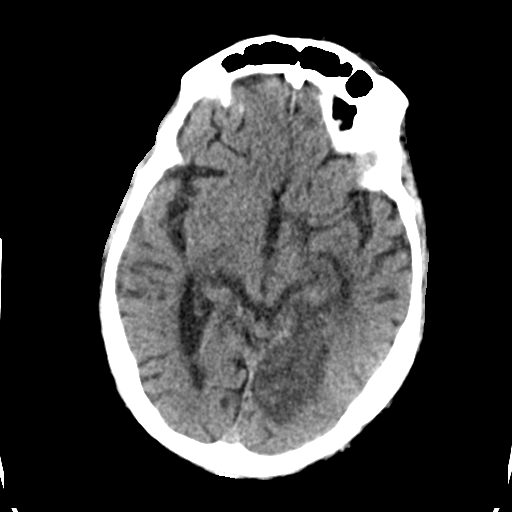
[im 17/33  brain]
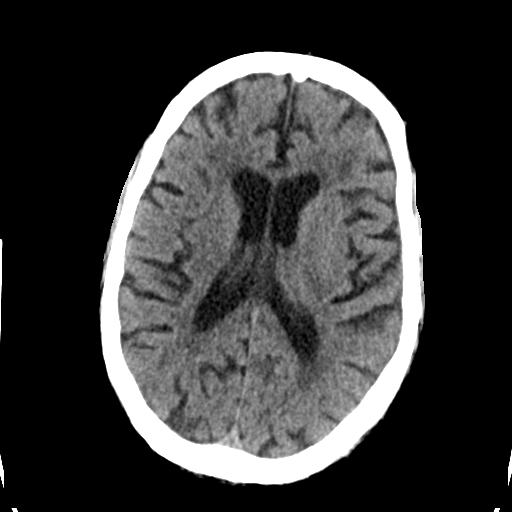
[im 21/33  brain]
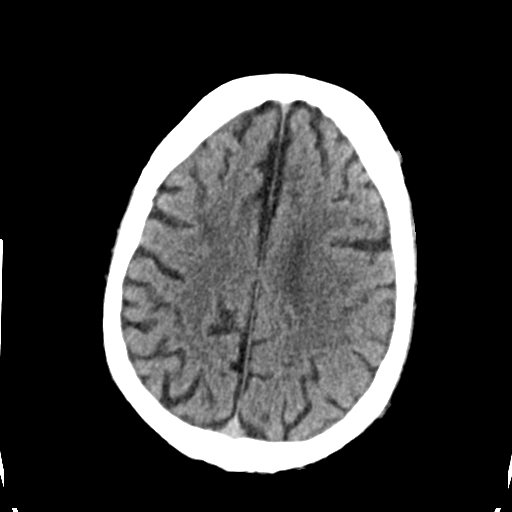
[im 21/33  bone]
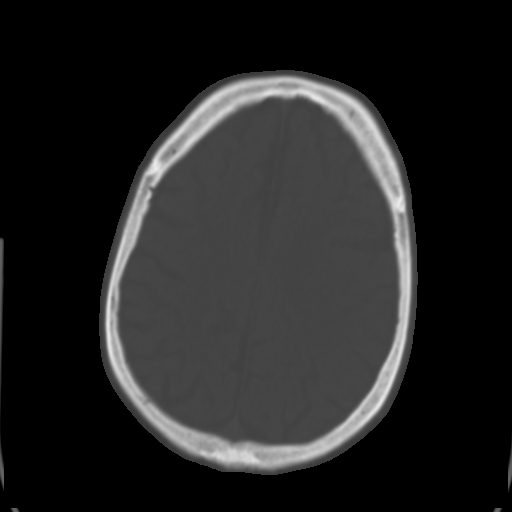
[im 25/33  brain]
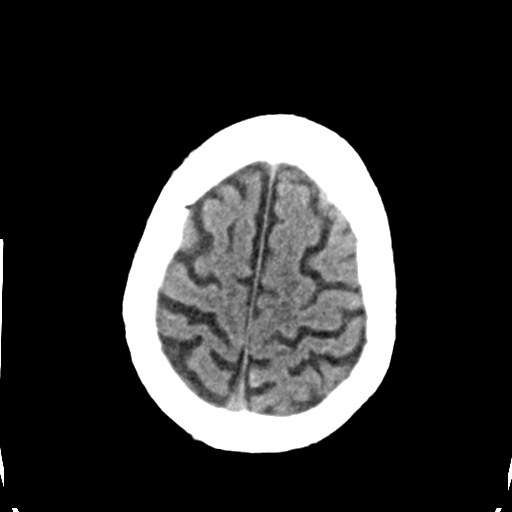
[im 29/33  brain]
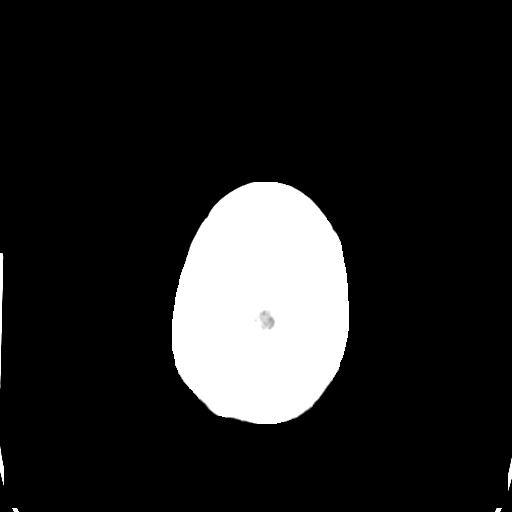

[Series 3: head bone · axial · 0.43mm/px · z∈[-48,-16]mm · 3 of 82 slices shown]
[im 9/82  bone]
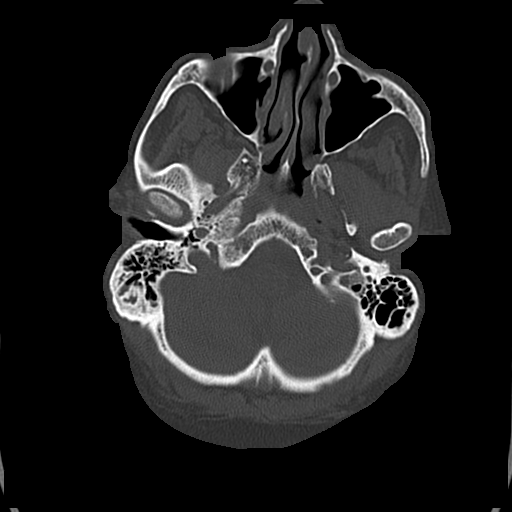
[im 17/82  bone]
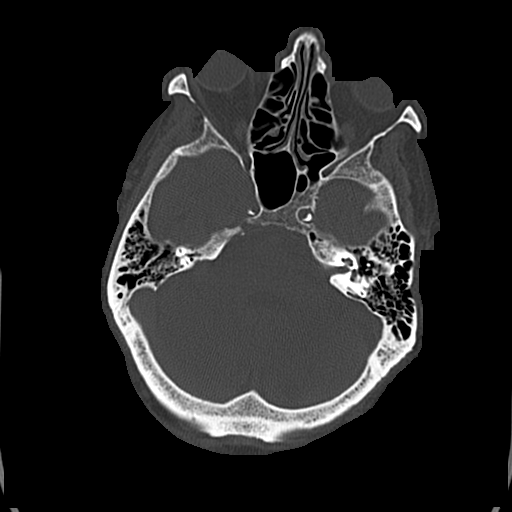
[im 25/82  bone]
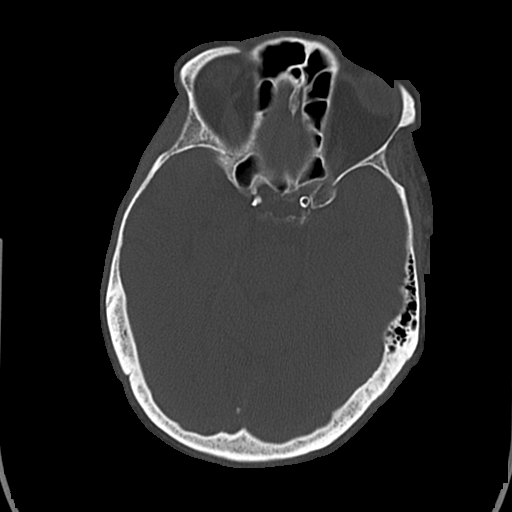

[Series 4: head without cor · coronal · non-contrast · 0.28mm/px · 3 of 61 slices shown]
[im 21/61  brain]
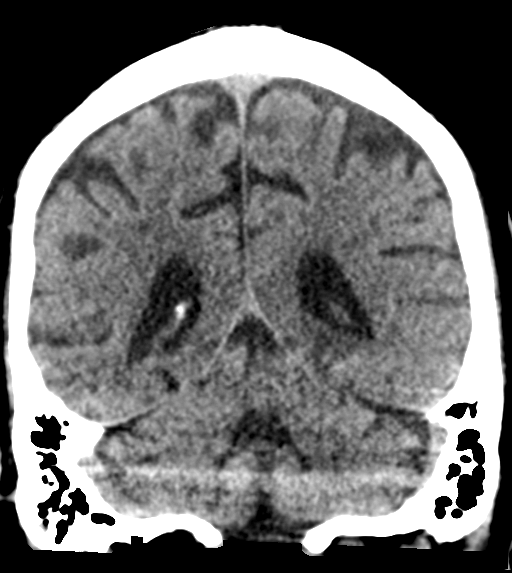
[im 27/61  brain]
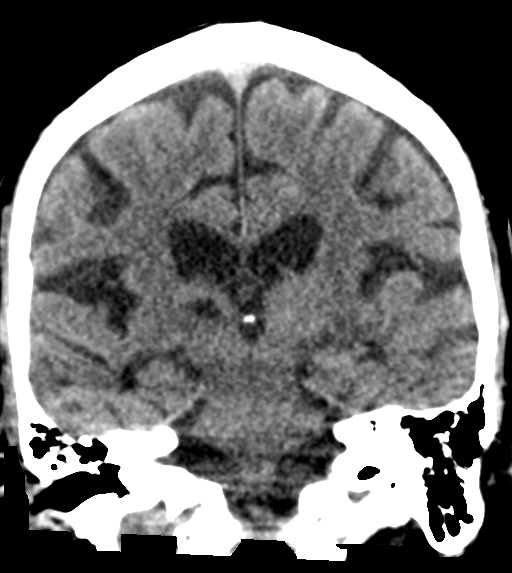
[im 34/61  brain]
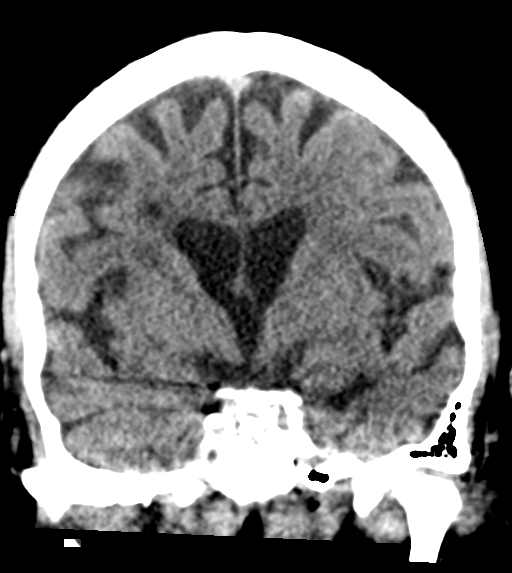

[Series 5: head without sag · sagittal · non-contrast · 0.30mm/px · 3 of 47 slices shown]
[im 16/47  brain]
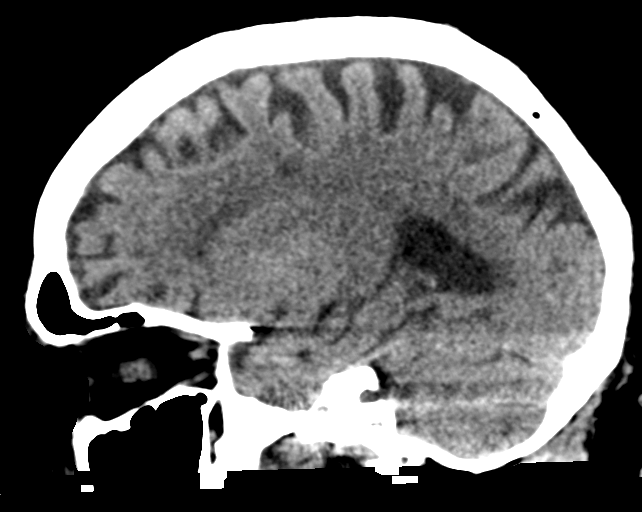
[im 24/47  brain]
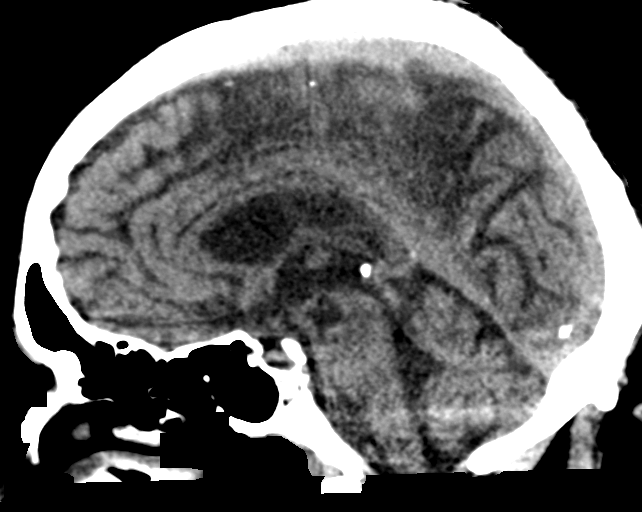
[im 31/47  brain]
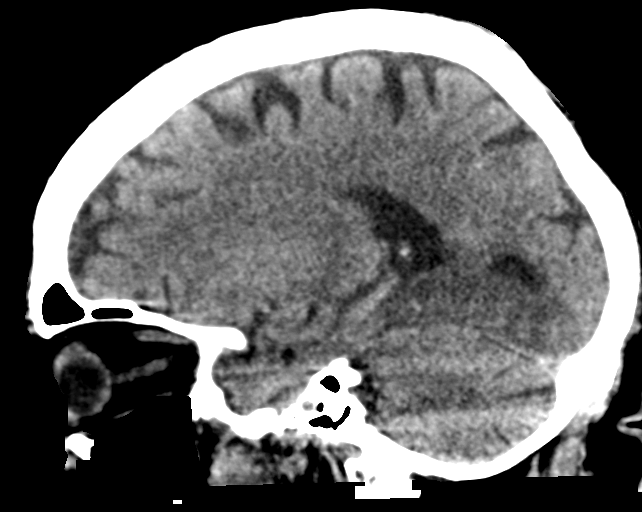

[16 of 47 positions shown; findings below may reference images not displayed]

FINDINGS: Brain: There is well-defined hypoattenuation involving the left
occipital lobe consistent with the recent infarct, new since the
prior head CT.

There are no other areas of abnormal parenchymal attenuation to
suggest additional recent infarcts. There are stable areas of white
matter hypoattenuation consistent with mild chronic microvascular
ischemic change. Small old white matter lacune infarcts are noted in
the right frontal lobe with another noted in the right thalamus.

There are no parenchymal masses or mass effect. There are no
extra-axial masses or abnormal fluid collections.

There is no intracranial hemorrhage.

Vascular: No hyperdense vessel or unexpected calcification.

Skull: Normal. Negative for fracture or focal lesion.

Sinuses/Orbits: No acute finding.

Other: None.
IMPRESSION: 1. Left PCA distribution, occipital lobe, subacute infarct.
2. No other recent abnormalities.
3. No intracranial hemorrhage.
These results were called by telephone at the time of interpretation
on 10/09/2016 at [DATE] to Dr. EMORY, who verbally acknowledged
these results.

## 2017-07-29 ENCOUNTER — Encounter: Payer: Self-pay | Admitting: Internal Medicine
